# Patient Record
Sex: Male | Born: 1945
Health system: Southern US, Community
[De-identification: ages and names within clinical notes are randomized; demographics above are authoritative.]

## PROBLEM LIST (undated history)

## (undated) ENCOUNTER — Emergency Department (HOSPITAL_COMMUNITY): Payer: No Typology Code available for payment source

## (undated) DIAGNOSIS — N200 Calculus of kidney: Secondary | ICD-10-CM

## (undated) DIAGNOSIS — E785 Hyperlipidemia, unspecified: Secondary | ICD-10-CM

## (undated) DIAGNOSIS — I639 Cerebral infarction, unspecified: Secondary | ICD-10-CM

## (undated) DIAGNOSIS — Z87442 Personal history of urinary calculi: Secondary | ICD-10-CM

## (undated) DIAGNOSIS — C7802 Secondary malignant neoplasm of left lung: Secondary | ICD-10-CM

## (undated) DIAGNOSIS — C801 Malignant (primary) neoplasm, unspecified: Secondary | ICD-10-CM

## (undated) DIAGNOSIS — I10 Essential (primary) hypertension: Secondary | ICD-10-CM

## (undated) DIAGNOSIS — Z872 Personal history of diseases of the skin and subcutaneous tissue: Secondary | ICD-10-CM

## (undated) DIAGNOSIS — R011 Cardiac murmur, unspecified: Secondary | ICD-10-CM

## (undated) DIAGNOSIS — N281 Cyst of kidney, acquired: Secondary | ICD-10-CM

## (undated) DIAGNOSIS — M1990 Unspecified osteoarthritis, unspecified site: Secondary | ICD-10-CM

## (undated) DIAGNOSIS — E119 Type 2 diabetes mellitus without complications: Secondary | ICD-10-CM

## (undated) DIAGNOSIS — N529 Male erectile dysfunction, unspecified: Secondary | ICD-10-CM

## (undated) DIAGNOSIS — K7460 Unspecified cirrhosis of liver: Secondary | ICD-10-CM

## (undated) DIAGNOSIS — R319 Hematuria, unspecified: Secondary | ICD-10-CM

## (undated) DIAGNOSIS — Z973 Presence of spectacles and contact lenses: Secondary | ICD-10-CM

## (undated) HISTORY — PX: WISDOM TOOTH EXTRACTION: SHX21

## (undated) HISTORY — DX: Male erectile dysfunction, unspecified: N52.9

## (undated) HISTORY — DX: Unspecified cirrhosis of liver: K74.60

## (undated) HISTORY — DX: Essential (primary) hypertension: I10

## (undated) HISTORY — PX: ROTATOR CUFF REPAIR: SHX139

## (undated) HISTORY — DX: Hyperlipidemia, unspecified: E78.5

## (undated) HISTORY — PX: OTHER SURGICAL HISTORY: SHX169

## (undated) HISTORY — PX: CARPAL TUNNEL RELEASE: SHX101

---

## 2006-01-03 HISTORY — PX: KNEE ARTHROSCOPY: SUR90

## 2006-09-28 ENCOUNTER — Encounter (HOSPITAL_COMMUNITY): Admission: RE | Admit: 2006-09-28 | Discharge: 2006-10-03 | Payer: Self-pay

## 2006-10-04 ENCOUNTER — Encounter (HOSPITAL_COMMUNITY): Admission: RE | Admit: 2006-10-04 | Discharge: 2006-11-03 | Payer: Self-pay | Admitting: Orthopedic Surgery

## 2006-11-13 ENCOUNTER — Encounter (HOSPITAL_COMMUNITY): Admission: RE | Admit: 2006-11-13 | Discharge: 2006-12-13 | Payer: Self-pay | Admitting: Orthopedic Surgery

## 2006-12-15 ENCOUNTER — Encounter (HOSPITAL_COMMUNITY): Admission: RE | Admit: 2006-12-15 | Discharge: 2007-01-03 | Payer: Self-pay | Admitting: Orthopedic Surgery

## 2007-01-04 HISTORY — PX: TOTAL KNEE ARTHROPLASTY: SHX125

## 2007-07-02 ENCOUNTER — Encounter (HOSPITAL_COMMUNITY): Admission: RE | Admit: 2007-07-02 | Discharge: 2007-08-01 | Payer: Self-pay

## 2007-08-02 ENCOUNTER — Encounter (HOSPITAL_COMMUNITY): Admission: RE | Admit: 2007-08-02 | Discharge: 2007-09-01 | Payer: Self-pay | Admitting: Orthopedic Surgery

## 2007-09-03 ENCOUNTER — Encounter (HOSPITAL_COMMUNITY): Admission: RE | Admit: 2007-09-03 | Discharge: 2007-10-03 | Payer: Self-pay | Admitting: Orthopedic Surgery

## 2007-09-05 ENCOUNTER — Ambulatory Visit: Payer: Self-pay | Admitting: Cardiology

## 2007-09-14 ENCOUNTER — Ambulatory Visit: Payer: Self-pay

## 2007-10-04 ENCOUNTER — Encounter (HOSPITAL_COMMUNITY): Admission: RE | Admit: 2007-10-04 | Discharge: 2007-11-03 | Payer: Self-pay | Admitting: Orthopedic Surgery

## 2007-11-05 ENCOUNTER — Encounter (HOSPITAL_COMMUNITY): Admission: RE | Admit: 2007-11-05 | Discharge: 2007-12-05 | Payer: Self-pay | Admitting: Orthopedic Surgery

## 2010-02-03 ENCOUNTER — Encounter (INDEPENDENT_AMBULATORY_CARE_PROVIDER_SITE_OTHER): Payer: Self-pay | Admitting: *Deleted

## 2010-02-10 NOTE — Letter (Signed)
Summary: Pre Visit Letter Revised  [facility]  [address] [place], [place] [address]   Phone: [phone]  Fax: [phone]        02/03/2010 MRN: [id number] Dalton Miller [address] [place], [place]  [address]             Procedure Date:  03-04-10   Welcome to the Gastroenterology Division at [facility]    You are scheduled to see a nurse for your pre-procedure visit on 02-18-10 at 1:30P.M. on the 3rd floor at [facility], [address]  We ask that you try to arrive at our office 15 minutes prior to your appointment time to allow for check-in.  Please take a minute to review the attached form.  If you answer "Yes" to one or more of the questions on the first page, we ask that you call the person listed at your earliest opportunity.  If you answer "No" to all of the questions, please complete the rest of the form and bring it to your appointment.    Your nurse visit will consist of discussing your medical and surgical history, your immediate family medical history, and your medications.   If you are unable to list all of your medications on the form, please bring the medication bottles to your appointment and we will list them.  We will need to be aware of both prescribed and over the counter drugs.  We will need to know exact dosage information as well.    Please be prepared to read and sign documents such as consent forms, a financial agreement, and acknowledgement forms.  If necessary, and with your consent, a friend or relative is welcome to sit-in on the nurse visit with you.  Please bring your insurance card so that we may make a copy of it.  If your insurance requires a referral to see a specialist, please bring your referral form from your primary care physician.  No co-pay is required for this nurse visit.     If you cannot keep your appointment, please call [phone] to cancel or reschedule prior to your appointment date.  This allows  [place] the opportunity to schedule an appointment for another patient in need of care.    Thank you for choosing [facility] for your medical needs.  We appreciate the opportunity to care for you.  Please visit [place] at our website  to learn more about our practice.  Sincerely, The Gastroenterology Division

## 2010-02-16 ENCOUNTER — Encounter (INDEPENDENT_AMBULATORY_CARE_PROVIDER_SITE_OTHER): Payer: Self-pay | Admitting: *Deleted

## 2010-02-18 ENCOUNTER — Encounter: Payer: Self-pay | Admitting: Gastroenterology

## 2010-02-18 ENCOUNTER — Encounter (INDEPENDENT_AMBULATORY_CARE_PROVIDER_SITE_OTHER): Payer: Self-pay | Admitting: *Deleted

## 2010-02-24 NOTE — Letter (Signed)
Summary: [facility] Instructions  [facility]  [address] [place], [place] [address]   Phone: [phone]  Fax: [phone]       Dalton Miller    64/23/1947    MRN: [id number]        Procedure Day Dorna Bloom:  Lenor Coffin  03/04/10     Arrival Time:  7:30AM     Procedure Time:  8:00AM     Location of Procedure:                    _ X_  [facility] (4th Floor)                      PREPARATION FOR COLONOSCOPY WITH MOVIPREP   Starting 5 days prior to your procedure 64/[birth year]/12 do not eat nuts, seeds, popcorn, corn, beans, peas,  salads, or any raw vegetables.  Do not take any fiber supplements (e.g. Metamucil, Citrucel, and Benefiber).  THE DAY BEFORE YOUR PROCEDURE         DATE: 03/03/10  DAY: WEDNESDAY  1.  Drink clear liquids the entire day-NO SOLID FOOD  2.  Do not drink anything colored red or purple.  Avoid juices with pulp.  No orange juice.  3.  Drink at least 64 oz. (8 glasses) of fluid/clear liquids during the day to prevent dehydration and help the prep work efficiently.  CLEAR LIQUIDS INCLUDE: Water Jello Ice Popsicles Tea (sugar ok, no milk/cream) Powdered fruit flavored drinks Coffee (sugar ok, no milk/cream) Gatorade Juice: apple, white grape, white cranberry  Lemonade Clear bullion, consomm, broth Carbonated beverages (any kind) Strained chicken noodle soup Hard Candy                             4.  In the morning, mix first dose of MoviPrep solution:    Empty 1 Pouch A and 1 Pouch B into the disposable container    Add lukewarm drinking water to the top line of the container. Mix to dissolve    Refrigerate (mixed solution should be used within 24 hrs)  5.  Begin drinking the prep at 5:00 p.m. The MoviPrep container is divided by 4 marks.   Every 15 minutes drink the solution down to the next mark (approximately 8 oz) until the full liter is complete.   6.  Follow completed prep with 16 oz of clear liquid of your choice (Nothing  red or purple).  Continue to drink clear liquids until bedtime.  7.  Before going to bed, mix second dose of MoviPrep solution:    Empty 1 Pouch A and 1 Pouch B into the disposable container    Add lukewarm drinking water to the top line of the container. Mix to dissolve    Refrigerate  THE DAY OF YOUR PROCEDURE      DATE: 03/04/10   DAY: THURSDAY  Beginning at 3:00AM (5 hours before procedure):         1. Every 15 minutes, drink the solution down to the next mark (approx 8 oz) until the full liter is complete.  2. Follow completed prep with 16 oz. of clear liquid of your choice.    3. You may drink clear liquids until 6:00AM (2 HOURS BEFORE PROCEDURE).   MEDICATION INSTRUCTIONS  Unless otherwise instructed, you should take regular prescription medications with a small sip of water   as early as possible the morning of  your procedure.  Diabetic patients - see separate instructions.         OTHER INSTRUCTIONS  You will need a responsible adult at least 64 years of age to accompany you and drive you home.   This person must remain in the waiting room during your procedure.  Wear loose fitting clothing that is easily removed.  Leave jewelry and other valuables at home.  However, you may wish to bring a book to read or  an iPod/MP3 player to listen to music as you wait for your procedure to start.  Remove all body piercing jewelry and leave at home.  Total time from sign-in until discharge is approximately 2-3 hours.  You should go home directly after your procedure and rest.  You can resume normal activities the  day after your procedure.  The day of your procedure you should not:   Drive   Make legal decisions   Operate machinery   Drink alcohol   Return to work  You will receive specific instructions about eating, activities and medications before you leave.    The above instructions have been reviewed and explained to me by   Karl Bales RN   February 18, 2010 1:54 PM    I fully understand and can verbalize these instructions _____________________________ Date _________

## 2010-02-24 NOTE — Letter (Signed)
Summary: Diabetic Instructions   [facility]  [address]   [place], [place] [address]   Phone: [phone]  Fax: [phone]    Dalton Miller 05/09/1945 MRN: [id number]   x    ORAL DIABETIC MEDICATION INSTRUCTIONS  The Miller before your procedure:   Take your diabetic pill as you do normally  The Miller of your procedure:   Do not take your diabetic pill    We will check your blood sugar levels during the admission process and again in Recovery before discharging you home  ________________________________________________________________________  _  _   INSULIN (LONG ACTING) MEDICATION INSTRUCTIONS (Lantus, NPH, 70/30, Humulin, Novolin-N)   The Miller before your procedure:   Take  your regular evening dose    The Miller of your procedure:   Do not take your morning dose    _  _   INSULIN (SHORT ACTING) MEDICATION INSTRUCTIONS (Regular, Humulog, Novolog)   The Miller before your procedure:   Do not take your evening dose   The Miller of your procedure:   Do not take your morning dose   _  _   INSULIN PUMP MEDICATION INSTRUCTIONS  We will contact the physician managing your diabetic care for written dosage instructions for the Miller before your procedure and the Miller of your procedure.  Once we have received the instructions, we will contact you.

## 2010-02-24 NOTE — Miscellaneous (Signed)
Summary: [facility] previsit  Clinical Lists Changes  Medications: Added new medication of MOVIPREP 100 GM  SOLR (PEG-KCL-NACL-NASULF-NA ASC-C) As per prep instructions. - Signed Rx of MOVIPREP 100 GM  SOLR (PEG-KCL-NACL-NASULF-NA ASC-C) As per prep instructions.;  #1 x 0;  Signed;  Entered by: Karl Bales RN;  Authorized by: Louis Meckel MD;  Method used: Electronically to [facility]*, [address] [place], [place], [place]  [address], Ph: [phone], Fax: [phone] Observations: Added new observation of NKA: T (02/18/2010 13:43)    Prescriptions: MOVIPREP 100 GM  SOLR (PEG-KCL-NACL-NASULF-NA ASC-C) As per prep instructions.  #1 x 0   Entered by:   Karl Bales RN   Authorized by:   Louis Meckel MD   Signed by:   Karl Bales RN on 02/18/2010   Method used:   Electronically to        [facility]* (retail)       [address]       [place], [place]  [address]       Ph: [phone]       Fax: [phone]   RxID:   [id number]

## 2010-03-04 ENCOUNTER — Other Ambulatory Visit (AMBULATORY_SURGERY_CENTER): Payer: Medicare Other | Admitting: Gastroenterology

## 2010-03-04 ENCOUNTER — Other Ambulatory Visit: Payer: Self-pay | Admitting: Gastroenterology

## 2010-03-04 DIAGNOSIS — K5520 Angiodysplasia of colon without hemorrhage: Secondary | ICD-10-CM

## 2010-03-04 DIAGNOSIS — Z1211 Encounter for screening for malignant neoplasm of colon: Secondary | ICD-10-CM

## 2010-03-04 HISTORY — PX: COLONOSCOPY: SHX174

## 2010-03-04 LAB — HM COLONOSCOPY

## 2010-03-04 LAB — GLUCOSE, CAPILLARY
Glucose-Capillary: 155 mg/dL — ABNORMAL HIGH (ref 70–99)
Glucose-Capillary: 119 mg/dL — ABNORMAL HIGH (ref 70–99)

## 2010-03-11 NOTE — Procedures (Signed)
Summary: Colonoscopy  Patient: [name] Note: All result statuses are Final unless otherwise noted.  Tests: (1) Colonoscopy (COL)   COL Colonoscopy           DONE      [facility]     [address]     [place], [place]  [address]           COLONOSCOPY PROCEDURE REPORT           PATIENT:  Dalton Miller, Dalton Miller  MR#:  [id number]     BIRTHDATE:  Jan 21, 1945, 65 yrs. old  GENDER:  male           ENDOSCOPIST:  Barbette Hair. Arlyce Dice, MD     Referred by:  Helene Kelp, PA           PROCEDURE DATE:  03/04/2010     PROCEDURE:  Diagnostic Colonoscopy     ASA CLASS:  Class II     INDICATIONS:  1) Routine Risk Screening           MEDICATIONS:   Fentanyl 75 mcg IV, Versed 6 mg IV           DESCRIPTION OF PROCEDURE:   After the risks benefits and     alternatives of the procedure were thoroughly explained, informed     consent was obtained.  Digital rectal exam was performed and     revealed no abnormalities.   The LB CF-[id number] endoscope     was introduced through the anus and advanced to the cecum, which     was identified by both the appendix and ileocecal valve, without     limitations.  The quality of the prep was excellent, using     MoviPrep.  The instrument was then slowly withdrawn as the colon     was fully examined.     <<PROCEDUREIMAGES>>           FINDINGS:  Arteriovenous malformations were seen in the in the     cecum (see image2). Single 2mm AVM  This was otherwise a normal     examination of the colon (see image1, image3, image4, image6,     image7, image9, image11, and image12).   Retroflexed views in the     rectum revealed no abnormalities.    The time to cecum =  2.25     minutes. The scope was then withdrawn (time =  7.0  min) from the     patient and the procedure completed.           COMPLICATIONS:  None           ENDOSCOPIC IMPRESSION:     1) AVM in the cecum     2) Otherwise normal examination     RECOMMENDATIONS:     1) Continue current colorectal  screening recommendations for     "routine risk" patients with a repeat colonoscopy in 10 years.           REPEAT EXAM:   10 year(s) Colonoscopy           ______________________________     Barbette Hair. Arlyce Dice, MD           CC:           n.     eSIGNED:   Barbette Hair. Kaplan at 03/04/2010 08:37 AM           Butt, Homero Fellers, [id number]  Note: An exclamation mark (!) indicates a result that was not  dispersed into the flowsheet. Document Creation Date: 03/04/2010 8:37 AM _______________________________________________________________________  (1) Order result status: Final Collection or observation date-time: 03/04/2010 08:30 Requested date-time:  Receipt date-time:  Reported date-time:  Referring Physician:   Ordering Physician: Melvia Heaps [phone]) Specimen Source:  Source: Launa Grill Order Number: [id number] Lab site:   Appended Document: Colonoscopy    Clinical Lists Changes  Observations: Added new observation of COLONNXTDUE: 03/2020 (03/04/2010 13:54)

## 2010-03-22 ENCOUNTER — Encounter: Payer: Self-pay | Admitting: *Deleted

## 2010-03-22 DIAGNOSIS — E1169 Type 2 diabetes mellitus with other specified complication: Secondary | ICD-10-CM | POA: Insufficient documentation

## 2010-03-22 DIAGNOSIS — E119 Type 2 diabetes mellitus without complications: Secondary | ICD-10-CM | POA: Insufficient documentation

## 2010-03-22 DIAGNOSIS — E785 Hyperlipidemia, unspecified: Secondary | ICD-10-CM

## 2010-03-22 DIAGNOSIS — I10 Essential (primary) hypertension: Secondary | ICD-10-CM | POA: Insufficient documentation

## 2010-05-18 NOTE — Assessment & Plan Note (Signed)
[facility]                            CARDIOLOGY OFFICE NOTE   Aadit, Hagood BRANDI ARMATO                         MRN:          [id number]  DATE:09/05/2007                            DOB:          10-Feb-1945    PRIMARY CARE PHYSICIAN:  Lindaann Pascal, PA-C, [facility].   REASON FOR PRESENTATION:  Evaluate the patient with dyspnea and multiple  cardiovascular risk factors.   HISTORY OF PRESENT ILLNESS:  The patient is a pleasant 65 year old  gentleman without a prior cardiac history.  His only cardiac work up was  a stress test in [place].  He has multiple cardiovascular risk  factors.  He recently established with Mr. Jacqulyn Bath as a primary care.  He  has diabetes, which is being managed.  He is recently restarted on  Lipitor for dyslipidemia.  He was encouraged to quit smoking and he was  referred here because of multiple cardiovascular risk factors.   The patient is status post recent right knee replacement.  He is limited  in his activities because of this.  He gets around with a cane.  He is  not able to do his usual yard work.  He does get some dyspnea when he  walks moderate distance on level ground.  He does not have any resting  shortness of breath, PND, or orthopnea.  He does not have any chest  pressure, neck or arm discomfort.  He has no palpitations, presyncope or  syncope.   Of note, the patient does have an abnormal EKG with poor anterior R-wave  progression and could not exclude an old anteroseptal infarct.   PAST MEDICAL HISTORY:  1. Diabetes mellitus since 2003.  2. Hyperlipidemia since 2005.  3. Tobacco abuse.   PAST SURGICAL HISTORY:  Right knee arthroscopic surgery and eventual  right knee replacement.   ALLERGIES:  None.   MEDICATIONS:  1. Lipitor 10 mg daily (recently restarted).  2. Glipizide 10 mg daily.  3. Percocet.  4. Vicodin.   SOCIAL HISTORY:  The patient is a Location manager.  He has  not been  able to work with his knee replacement recently.  He is married.  He has  2 children.  He smokes 2 packs per day since the age of 86.   FAMILY HISTORY:  Remarkable for early coronary disease.  He had a  brother in his 31s who died of a myocardial infarction.  His mother died  of a CVA age 45, and his father had heart failure at age 46.   REVIEW OF SYSTEMS:  As stated in the HPI and negative for all other  systems.   PHYSICAL EXAMINATION:  GENERAL:  The patient is in no distress.  VITAL SIGNS:  Blood pressure 122/76, heart rate 67 and regular, weight  189 pounds, and body mass index 29.  HEENT:  Eyes unremarkable, pupils equal, round, and reactive to light;  fundi not visualized, oral mucosa unremarkable.  NECK:  No jugular venous distension at 45 degrees, carotid upstroke  brisk and symmetrical, no bruit, no thyromegaly.  LYMPHATICS:  No cervical, axillary, or inguinal adenopathy.  LUNGS:  Clear to auscultation bilaterally.  BACK:  No costovertebral angle tenderness.  CHEST:  Unremarkable.  HEART:  PMI not displaced or sustained; S1 and S2 are within normal  limits; no S3, no S4, no clicks, no rubs, no murmurs.  ABDOMEN:  Obese, positive bowel sounds, normal in frequency and pitch,  no bruits, no rebound, no guarding, no midline pulsatile mass, no  hepatomegaly, no splenomegaly.  SKIN:  No rashes, no nodules.  EXTREMITIES:  2+ pulses throughout, no edema, no cyanosis or clubbing.  NEURO:  Oriented to person, place, and time, cranial nerves II through  XII grossly intact, motor grossly intact.   EKG sinus rhythm, rate 67, low voltage in the limb leads, axis within  normal limits, intervals within normal limits, poor anterior R-wave  progression, cannot exclude old anteroseptal infarct.   ASSESSMENT AND PLAN:  1. Dyspnea.  The patient does have some dyspnea.  He is very limited      functionally because of his knee.  He has multiple cardiovascular      risk factors and  an abnormal EKG.  Given this, the pretest      probability of obstructive coronary disease is at least moderately      high.  The patient needs to be screened with a stress test.  He      would not be able to walk on a treadmill.  Therefore, he will have      an adenosine Cardiolite.  2. Tobacco.  We discussed the need to stop smoking (greater than 3      minutes).  I think he is now comitted to quitting cold [place],      which he has done before.  3. Diabetes per Mr. Jacqulyn Bath.  4. Hyperlipidemia.  His LDL was 138 and so he had his Lipitor      restarted.  I would suggest an LDL goal of less than 100 and HDL      greater than 40.  5. Weight.  We will work on losing weight over time.  Hopefully, as      his knee gets better he will be able to exercise more and this will      help.  6. Followup.  I will see the patient again in about 6 months or sooner      if he has any      abnormalities on a stress test.  I would like to continue to see      him to encourage primary risk reduction.     Rollene Rotunda, MD, [facility]  Electronically Signed    JH/MedQ  DD: 09/05/2007  DT: 09/06/2007  Job #: [id number]   cc:   Lindaann Pascal, PA-C

## 2010-06-09 ENCOUNTER — Encounter: Payer: Self-pay | Admitting: Physician Assistant

## 2012-09-05 ENCOUNTER — Emergency Department (HOSPITAL_COMMUNITY)
Admission: EM | Admit: 2012-09-05 | Discharge: 2012-09-05 | Disposition: A | Discharge status: Home / Self Care | Payer: Medicare Other | Attending: Emergency Medicine | Admitting: Emergency Medicine

## 2012-09-05 ENCOUNTER — Encounter (HOSPITAL_COMMUNITY): Payer: Self-pay | Admitting: *Deleted

## 2012-09-05 DIAGNOSIS — E785 Hyperlipidemia, unspecified: Secondary | ICD-10-CM | POA: Insufficient documentation

## 2012-09-05 DIAGNOSIS — Y929 Unspecified place or not applicable: Secondary | ICD-10-CM | POA: Insufficient documentation

## 2012-09-05 DIAGNOSIS — E119 Type 2 diabetes mellitus without complications: Secondary | ICD-10-CM | POA: Insufficient documentation

## 2012-09-05 DIAGNOSIS — Z79899 Other long term (current) drug therapy: Secondary | ICD-10-CM | POA: Insufficient documentation

## 2012-09-05 DIAGNOSIS — T169XXA Foreign body in ear, unspecified ear, initial encounter: Secondary | ICD-10-CM | POA: Insufficient documentation

## 2012-09-05 DIAGNOSIS — Y93K1 Activity, walking an animal: Secondary | ICD-10-CM | POA: Insufficient documentation

## 2012-09-05 DIAGNOSIS — Z87448 Personal history of other diseases of urinary system: Secondary | ICD-10-CM | POA: Insufficient documentation

## 2012-09-05 DIAGNOSIS — IMO0002 Reserved for concepts with insufficient information to code with codable children: Secondary | ICD-10-CM | POA: Insufficient documentation

## 2012-09-05 DIAGNOSIS — I10 Essential (primary) hypertension: Secondary | ICD-10-CM | POA: Insufficient documentation

## 2012-09-05 DIAGNOSIS — F17200 Nicotine dependence, unspecified, uncomplicated: Secondary | ICD-10-CM | POA: Insufficient documentation

## 2012-09-05 DIAGNOSIS — Z872 Personal history of diseases of the skin and subcutaneous tissue: Secondary | ICD-10-CM | POA: Insufficient documentation

## 2012-09-05 MED ORDER — CARBAMIDE PEROXIDE 6.5 % OT SOLN
5.0000 [drp] | Freq: Once | OTIC | Status: AC
  Administered 2012-09-05: 5 [drp] via OTIC
  Filled 2012-09-05: qty 15

## 2012-09-05 NOTE — ED Notes (Signed)
Patient given discharge instruction, verbalized understand. Patient ambulatory out of the department.  

## 2012-09-05 NOTE — ED Notes (Signed)
PA at the bedside, irrigation unsuccessful, pt tolerated well.

## 2012-09-05 NOTE — ED Notes (Signed)
Pt reports a bug flew into his ear at approximately  2120 this evening while walking his dog. Pt reports can hear bug Buzzing in his ear. Denies pain.

## 2012-09-05 NOTE — ED Provider Notes (Signed)
CSN: [id number]     Arrival date & time 09/05/12  2147 History   First MD Initiated Contact with Patient 09/05/12 2201     Chief Complaint  Patient presents with  . Foreign Body in Ear   (Consider location/radiation/quality/duration/timing/severity/associated sxs/prior Treatment) HPI Comments: Dalton Miller is a 67 y.o. Male who was walking his dog this evening when an insect flew in his right ear.  He denies pain,  But continues to hear a buzzing sound, so knows it is still present.  He denies any other symptoms tonight.     The history is provided by the patient.    Past Medical History  Diagnosis Date  . Cellulitis 03/16/10    resolved in left wrist   . RUQ pain     resolved   . Diabetes mellitus   . Hypertension   . Hyperlipidemia   . Abscess   . Erectile dysfunction    History reviewed. No pertinent past surgical history. No family history on file. History  Substance Use Topics  . Smoking status: Current Every Day Smoker  . Smokeless tobacco: Not on file  . Alcohol Use: No    Review of Systems  Constitutional: Negative for fever.  HENT: Negative for ear pain, congestion, sore throat and neck pain.   Eyes: Negative.   Respiratory: Negative for shortness of breath.   Cardiovascular: Negative for chest pain.  Gastrointestinal: Negative for nausea.  Musculoskeletal: Negative.   Skin: Negative.   Neurological: Negative for headaches.  Psychiatric/Behavioral: Negative.     Allergies  Review of patient's allergies indicates no known allergies.  Home Medications   Current Outpatient Rx  Name  Route  Sig  Dispense  Refill  . atorvastatin (LIPITOR) 10 MG tablet   Oral   Take 10 mg by mouth daily.           [name] glipiZIDE (GLUCOTROL) 5 MG tablet   Oral   Take 5 mg by mouth daily.           [name] lisinopril (PRINIVIL,ZESTRIL) 10 MG tablet   Oral   Take 10 mg by mouth daily.            BP 124/58  Pulse 66  Temp(Src) 97.7 F (36.5 C) (Oral)  Resp 20  Ht  5\' 7"  (1.702 m)  Wt 189 lb (85.73 kg)  BMI 29.59 kg/m2  SpO2 98% Physical Exam  Constitutional: He is oriented to person, place, and time. He appears well-developed and well-nourished.  HENT:  Head: Normocephalic and atraumatic.  Right Ear: Tympanic membrane normal. A foreign body is present.  Left Ear: Tympanic membrane and ear canal normal.  Nose: Mucosal edema and rhinorrhea present.  Mouth/Throat: Uvula is midline, oropharynx is clear and moist and mucous membranes are normal. No oropharyngeal exudate, posterior oropharyngeal edema, posterior oropharyngeal erythema or tonsillar abscesses.  Small flying insect (moth?) deep in right ear canal.  Occasional movement  Eyes: Conjunctivae are normal.  Cardiovascular: Normal rate and normal heart sounds.   Pulmonary/Chest: Effort normal. No respiratory distress. He has no wheezes. He has no rales.  Abdominal: Soft. There is no tenderness.  Musculoskeletal: Normal range of motion.  Neurological: He is alert and oriented to person, place, and time.  Skin: Skin is warm and dry. No rash noted.  Psychiatric: He has a normal mood and affect.    ED Course  FOREIGN BODY REMOVAL Date/Time: 09/06/2012 1:50 AM Performed by: Burgess Amor Authorized by: Burgess Amor Risks and benefits:  risks, benefits and alternatives were discussed Consent given by: patient Patient understanding: patient states understanding of the procedure being performed Patient identity confirmed: verbally with patient Body area: ear Location details: right ear Localization method: ENT speculum Removal mechanism: curette, forceps and suction Complexity: simple 1 objects recovered. Objects recovered: moth Post-procedure assessment: foreign body removed Patient tolerance: Patient tolerated the procedure well with no immediate complications.   (including critical care time)   Labs Review Labs Reviewed - No data to display Imaging Review No results found.  MDM   1.  Ear foreign body, initial encounter    Prn f/u with pcp for any residual problems or concerns.    Burgess Amor, PA-C 09/06/12 [phone]

## 2012-09-05 NOTE — ED Notes (Signed)
PA removed large bug, pt wanted to take with him to show family

## 2012-09-06 ENCOUNTER — Other Ambulatory Visit: Payer: Self-pay

## 2012-09-06 NOTE — Telephone Encounter (Signed)
Last seen 02/06/12  ACM   Last lipids and glucose level 2/14

## 2012-09-07 MED ORDER — GLIPIZIDE 5 MG PO TABS: 5.0000 mg | ORAL_TABLET | Freq: Every day | ORAL | Status: DC

## 2012-09-07 MED ORDER — LISINOPRIL 10 MG PO TABS: 10.0000 mg | ORAL_TABLET | Freq: Every day | ORAL | Status: DC

## 2012-09-07 MED ORDER — ATORVASTATIN CALCIUM 10 MG PO TABS: 10.0000 mg | ORAL_TABLET | Freq: Every day | ORAL | Status: DC

## 2012-09-08 NOTE — ED Provider Notes (Signed)
Medical screening examination/treatment/procedure(s) were performed by non-physician practitioner and as supervising physician I was immediately available for consultation/collaboration.  Donnetta Hutching, MD 09/08/12 [phone]

## 2012-09-17 ENCOUNTER — Other Ambulatory Visit: Payer: Self-pay

## 2012-09-17 MED ORDER — LISINOPRIL 10 MG PO TABS: 10.0000 mg | ORAL_TABLET | Freq: Every day | ORAL | Status: DC

## 2012-09-17 MED ORDER — ATORVASTATIN CALCIUM 10 MG PO TABS: 10.0000 mg | ORAL_TABLET | Freq: Every day | ORAL | Status: DC

## 2012-09-17 NOTE — Telephone Encounter (Signed)
x

## 2012-10-04 ENCOUNTER — Ambulatory Visit (INDEPENDENT_AMBULATORY_CARE_PROVIDER_SITE_OTHER): Payer: Medicare Other | Admitting: Family Medicine

## 2012-10-04 ENCOUNTER — Encounter: Payer: Self-pay | Admitting: Family Medicine

## 2012-10-04 ENCOUNTER — Other Ambulatory Visit: Payer: Self-pay

## 2012-10-04 VITALS — BP 122/65 | HR 68 | Temp 98.3°F | Ht 67.0 in | Wt 189.4 lb

## 2012-10-04 DIAGNOSIS — E119 Type 2 diabetes mellitus without complications: Secondary | ICD-10-CM

## 2012-10-04 DIAGNOSIS — E785 Hyperlipidemia, unspecified: Secondary | ICD-10-CM

## 2012-10-04 DIAGNOSIS — F17200 Nicotine dependence, unspecified, uncomplicated: Secondary | ICD-10-CM

## 2012-10-04 DIAGNOSIS — N529 Male erectile dysfunction, unspecified: Secondary | ICD-10-CM | POA: Insufficient documentation

## 2012-10-04 DIAGNOSIS — I10 Essential (primary) hypertension: Secondary | ICD-10-CM

## 2012-10-04 DIAGNOSIS — Z720 Tobacco use: Secondary | ICD-10-CM | POA: Insufficient documentation

## 2012-10-04 LAB — POCT GLYCOSYLATED HEMOGLOBIN (HGB A1C): Hemoglobin A1C: 6.2

## 2012-10-04 MED ORDER — GLIPIZIDE 5 MG PO TABS: 5.0000 mg | ORAL_TABLET | Freq: Every day | ORAL | Status: DC

## 2012-10-04 MED ORDER — ATORVASTATIN CALCIUM 10 MG PO TABS: 10.0000 mg | ORAL_TABLET | Freq: Every day | ORAL | Status: DC

## 2012-10-04 MED ORDER — LISINOPRIL 10 MG PO TABS: 10.0000 mg | ORAL_TABLET | Freq: Every day | ORAL | Status: DC

## 2012-10-04 MED ORDER — METFORMIN HCL 500 MG PO TABS: 500.0000 mg | ORAL_TABLET | Freq: Two times a day (BID) | ORAL | Status: DC

## 2012-10-04 NOTE — Patient Instructions (Addendum)
Smoking Cessation Quitting smoking is important to your health and has many advantages. However, it is not always easy to quit since nicotine is a very addictive drug. Often times, people try 3 times or more before being able to quit. This document explains the best ways for you to prepare to quit smoking. Quitting takes hard work and a lot of effort, but you can do it. ADVANTAGES OF QUITTING SMOKING  You will live longer, feel better, and live better.  Your body will feel the impact of quitting smoking almost immediately.  Within 20 minutes, blood pressure decreases. Your pulse returns to its normal level.  After 8 hours, carbon monoxide levels in the blood return to normal. Your oxygen level increases.  After 24 hours, the chance of having a heart attack starts to decrease. Your breath, hair, and body stop smelling like smoke.  After 48 hours, damaged nerve endings begin to recover. Your sense of taste and smell improve.  After 72 hours, the body is virtually free of nicotine. Your bronchial tubes relax and breathing becomes easier.  After 2 to 12 weeks, lungs can hold more air. Exercise becomes easier and circulation improves.  The risk of having a heart attack, stroke, cancer, or lung disease is greatly reduced.  After 1 year, the risk of coronary heart disease is cut in half.  After 5 years, the risk of stroke falls to the same as a nonsmoker.  After 10 years, the risk of lung cancer is cut in half and the risk of other cancers decreases significantly.  After 15 years, the risk of coronary heart disease drops, usually to the level of a nonsmoker.  If you are pregnant, quitting smoking will improve your chances of having a healthy baby.  The people you live with, especially any children, will be healthier.  You will have extra money to spend on things other than cigarettes. QUESTIONS TO THINK ABOUT BEFORE ATTEMPTING TO QUIT You may want to talk about your answers with your  caregiver.  Why do you want to quit?  If you tried to quit in the past, what helped and what did not?  What will be the most difficult situations for you after you quit? How will you plan to handle them?  Who can help you through the tough times? Your family? Friends? A caregiver?  What pleasures do you get from smoking? What ways can you still get pleasure if you quit? Here are some questions to ask your caregiver:  How can you help me to be successful at quitting?  What medicine do you think would be best for me and how should I take it?  What should I do if I need more help?  What is smoking withdrawal like? How can I get information on withdrawal? GET READY  Set a quit date.  Change your environment by getting rid of all cigarettes, ashtrays, matches, and lighters in your home, car, or work. Do not let people smoke in your home.  Review your past attempts to quit. Think about what worked and what did not. GET SUPPORT AND ENCOURAGEMENT You have a better chance of being successful if you have help. You can get support in many ways.  Tell your family, friends, and co-workers that you are going to quit and need their support. Ask them not to smoke around you.  Get individual, group, or telephone counseling and support. Programs are available at local hospitals and health centers. Call your local health department for   information about programs in your area.  Spiritual beliefs and practices may help some smokers quit.  Download a "quit meter" on your computer to keep track of quit statistics, such as how long you have gone without smoking, cigarettes not smoked, and money saved.  Get a self-help book about quitting smoking and staying off of tobacco. LEARN NEW SKILLS AND BEHAVIORS  Distract yourself from urges to smoke. Talk to someone, go for a walk, or occupy your time with a task.  Change your normal routine. Take a different route to work. Drink tea instead of coffee.  Eat breakfast in a different place.  Reduce your stress. Take a hot bath, exercise, or read a book.  Plan something enjoyable to do every day. Reward yourself for not smoking.  Explore interactive web-based programs that specialize in helping you quit. GET MEDICINE AND USE IT CORRECTLY Medicines can help you stop smoking and decrease the urge to smoke. Combining medicine with the above behavioral methods and support can greatly increase your chances of successfully quitting smoking.  Nicotine replacement therapy helps deliver nicotine to your body without the negative effects and risks of smoking. Nicotine replacement therapy includes nicotine gum, lozenges, inhalers, nasal sprays, and skin patches. Some may be available over-the-counter and others require a prescription.  Antidepressant medicine helps people abstain from smoking, but how this works is unknown. This medicine is available by prescription.  Nicotinic receptor partial agonist medicine simulates the effect of nicotine in your brain. This medicine is available by prescription. Ask your caregiver for advice about which medicines to use and how to use them based on your health history. Your caregiver will tell you what side effects to look out for if you choose to be on a medicine or therapy. Carefully read the information on the package. Do not use any other product containing nicotine while using a nicotine replacement product.  RELAPSE OR DIFFICULT SITUATIONS Most relapses occur within the first 3 months after quitting. Do not be discouraged if you start smoking again. Remember, most people try several times before finally quitting. You may have symptoms of withdrawal because your body is used to nicotine. You may crave cigarettes, be irritable, feel very hungry, cough often, get headaches, or have difficulty concentrating. The withdrawal symptoms are only temporary. They are strongest when you first quit, but they will go away within  10 14 days. To reduce the chances of relapse, try to:  Avoid drinking alcohol. Drinking lowers your chances of successfully quitting.  Reduce the amount of caffeine you consume. Once you quit smoking, the amount of caffeine in your body increases and can give you symptoms, such as a rapid heartbeat, sweating, and anxiety.  Avoid smokers because they can make you want to smoke.  Do not let weight gain distract you. Many smokers will gain weight when they quit, usually less than 10 pounds. Eat a healthy diet and stay active. You can always lose the weight gained after you quit.  Find ways to improve your mood other than smoking. FOR MORE INFORMATION  www.smokefree.gov  Document Released: 12/14/2000 Document Revised: 06/21/2011 Document Reviewed: 03/31/2011 [employer] Patient Information 2014 [employer].        Dr Edson Deridder's Recommendations  For nutrition information, I recommend books:  1).Eat to Live by Dr Joel Fuhrman. 2).Prevent and Reverse Heart Disease by Dr Caldwell Esselstyn. 3) Dr Neal Barnard's Book:  Program to Reverse Diabetes  Exercise recommendations are:  If unable to walk, then the patient can   exercise in a chair 3 times a day. By flapping arms like a bird gently and raising legs outwards to the front.  If ambulatory, the patient can go for walks for 30 minutes 3 times a week. Then increase the intensity and duration as tolerated.  Goal is to try to attain exercise frequency to 5 times a week.  If applicable: Best to perform resistance exercises (machines or weights) 2 days a week and cardio type exercises 3 days per week.  

## 2012-10-04 NOTE — Progress Notes (Signed)
Patient ID: Dalton Miller, male   DOB: 03/02/1945, 67 y.o.   MRN: [id number] SUBJECTIVE: CC: Chief Complaint  Patient presents with  . Medication Refill    needs refills on all meds wants 90 days supply  Wants RX Printed    HPI:   Patient is here for follow up of Diabetes Mellitus/HTN/HLD: Symptoms evaluated: Denies Nocturia ,Denies Urinary Frequency , denies Blurred vision ,deniesDizziness,denies.Dysuria,denies paresthesias, denies extremity pain or ulcers.Marland Kitchendenies chest pain. has had an annual eye exam. do check the feet. Does check CBGs. Average IRJ:JOACZ'Y check all the time Denies episodes of hypoglycemia. Does have an emergency hypoglycemic plan. admits toCompliance with medications. Denies Problems with medications.  Smokes Diet could be better  Past Medical History  Diagnosis Date  . Cellulitis 03/16/10    resolved in left wrist   . RUQ pain     resolved   . Diabetes mellitus   . Hypertension   . Hyperlipidemia   . Abscess   . Erectile dysfunction    No past surgical history on file. History   Social History  . Marital Status: Married    Spouse Name: N/A    Number of Children: N/A  . Years of Education: N/A   Occupational History  . Not on file.   Social History Main Topics  . Smoking status: Current Every Day Smoker  . Smokeless tobacco: Not on file  . Alcohol Use: No  . Drug Use: No  . Sexual Activity: Not on file   Other Topics Concern  . Not on file   Social History Narrative  . No narrative on file   No family history on file. Current Outpatient Prescriptions on File Prior to Visit  Medication Sig Dispense Refill  . atorvastatin (LIPITOR) 10 MG tablet Take 1 tablet (10 mg total) by mouth daily.  30 tablet  0  . glipiZIDE (GLUCOTROL) 5 MG tablet Take 1 tablet (5 mg total) by mouth daily.  30 tablet  0  . lisinopril (PRINIVIL,ZESTRIL) 10 MG tablet Take 1 tablet (10 mg total) by mouth daily.  30 tablet  0   No current facility-administered  medications on file prior to visit.   No Known Allergies Immunization History  Administered Date(s) Administered  . Influenza Whole 10/04/2007  . Pneumococcal Polysaccharide 10/04/2007   Prior to Admission medications   Medication Sig Start Date End Date Taking? Authorizing Provider  atorvastatin (LIPITOR) 10 MG tablet Take 1 tablet (10 mg total) by mouth daily. 09/17/12  Yes Ernestina Penna, MD  glipiZIDE (GLUCOTROL) 5 MG tablet Take 1 tablet (5 mg total) by mouth daily. 09/06/12  Yes Ileana Ladd, MD  lisinopril (PRINIVIL,ZESTRIL) 10 MG tablet Take 1 tablet (10 mg total) by mouth daily. 09/17/12  Yes Ernestina Penna, MD  metFORMIN (GLUCOPHAGE) 500 MG tablet Take 500 mg by mouth 2 (two) times daily with a meal.   Yes Historical Provider, MD    ROS: As above in the HPI. All other systems are stable or negative.  OBJECTIVE: APPEARANCE:  Patient in no acute distress.The patient appeared well nourished and normally developed. Acyanotic. Waist: VITAL SIGNS:BP 122/65  Pulse 68  Temp(Src) 98.3 F (36.8 C) (Oral)  Ht 5\' 7"  (1.702 m)  Wt 189 lb 6.4 oz (85.911 kg)  BMI 29.66 kg/m2 WM overweight  SKIN: warm and  Dry without overt rashes, tattoos and scars  HEAD and Neck: without JVD, Head and scalp: normal Eyes:No scleral icterus. Fundi normal, eye movements normal. Ears: Auricle  normal, canal normal, Tympanic membranes normal, insufflation normal. Nose: normal Throat: normal Neck & thyroid: normal  CHEST & LUNGS: Chest wall: normal Lungs: Clear  CVS: Reveals the PMI to be normally located. Regular rhythm, First and Second Heart sounds are normal,  absence of murmurs, rubs or gallops. Peripheral vasculature: Radial pulses: normal Dorsal pedis pulses: normal Posterior pulses: normal  ABDOMEN:  Appearance: overweight Benign, no organomegaly, no masses, no Abdominal Aortic enlargement. No Guarding , no rebound. No Bruits. Bowel sounds: normal  RECTAL: N/A GU:  N/A  EXTREMETIES: nonedematous.  MUSCULOSKELETAL:  Spine: normal Joints: intact  NEUROLOGIC: oriented to time,place and person; nonfocal. Strength is normal Sensory is normal Reflexes are normal Cranial Nerves are normal. DM foot exam performed.  Results for orders placed in visit on 10/04/12  POCT GLYCOSYLATED HEMOGLOBIN (HGB A1C)      Result Value Range   Hemoglobin A1C 6.2 %      ASSESSMENT: Diabetes mellitus - Plan: glipiZIDE (GLUCOTROL) 5 MG tablet, POCT glycosylated hemoglobin (Hb A1C), POCT UA - Microalbumin, CMP14+EGFR, DISCONTINUED: metFORMIN (GLUCOPHAGE) 500 MG tablet  Hyperlipidemia - Plan: atorvastatin (LIPITOR) 10 MG tablet, NMR, lipoprofile, CMP14+EGFR  Hypertension - Plan: lisinopril (PRINIVIL,ZESTRIL) 10 MG tablet, CMP14+EGFR  Tobacco user  Erectile dysfunction   PLAN:  Smoking cessation  Orders Placed This Encounter  Procedures  . NMR, lipoprofile  . CMP14+EGFR  . POCT glycosylated hemoglobin (Hb A1C)  . POCT UA - Microalbumin    Meds ordered this encounter  Medications  . DISCONTD: metFORMIN (GLUCOPHAGE) 500 MG tablet    Sig: Take 500 mg by mouth 2 (two) times daily with a meal.  . lisinopril (PRINIVIL,ZESTRIL) 10 MG tablet    Sig: Take 1 tablet (10 mg total) by mouth daily.    Dispense:  90 tablet    Refill:  3    NTBS  . DISCONTD: metFORMIN (GLUCOPHAGE) 500 MG tablet    Sig: Take 1 tablet (500 mg total) by mouth 2 (two) times daily with a meal.    Dispense:  180 tablet    Refill:  3  . atorvastatin (LIPITOR) 10 MG tablet    Sig: Take 1 tablet (10 mg total) by mouth daily.    Dispense:  90 tablet    Refill:  3    Needs to be seen  . glipiZIDE (GLUCOTROL) 5 MG tablet    Sig: Take 1 tablet (5 mg total) by mouth daily.    Dispense:  90 tablet    Refill:  3    Needs to be seen         Dr Woodroe Mode Recommendations  For nutrition information, I recommend books:  1).Eat to Live by Dr Monico Hoar. 2).Prevent and Reverse  Heart Disease by Dr Suzzette Righter. 3) Dr Katherina Right Book:  Program to Reverse Diabetes  Exercise recommendations are:  If unable to walk, then the patient can exercise in a chair 3 times a day. By flapping arms like a bird gently and raising legs outwards to the front.  If ambulatory, the patient can go for walks for 30 minutes 3 times a week. Then increase the intensity and duration as tolerated.  Goal is to try to attain exercise frequency to 5 times a week.  If applicable: Best to perform resistance exercises (machines or weights) 2 days a week and cardio type exercises 3 days per week.  Handout the AVS and  Discussion on smoking cessation . Spent 15 minutes  Return in about 3  months (around 01/04/2013) for Recheck medical problems.  Belen Zwahlen P. [place] Charon, M.D.

## 2012-10-06 LAB — CMP14+EGFR
ALT: 12 IU/L (ref 0–44)
AST: 14 IU/L (ref 0–40)
Albumin/Globulin Ratio: 2.1 (ref 1.1–2.5)
Albumin: 4.5 g/dL (ref 3.6–4.8)
Alkaline Phosphatase: 84 IU/L (ref 39–117)
BUN/Creatinine Ratio: 14 (ref 10–22)
BUN: 13 mg/dL (ref 8–27)
CO2: 24 mmol/L (ref 18–29)
Calcium: 9.3 mg/dL (ref 8.6–10.2)
Chloride: 103 mmol/L (ref 97–108)
Creatinine, Ser: 0.9 mg/dL (ref 0.76–1.27)
GFR calc Af Amer: 103 mL/min/{1.73_m2} (ref >59)
GFR calc non Af Amer: 89 mL/min/{1.73_m2} (ref >59)
Globulin, Total: 2.1 g/dL (ref 1.5–4.5)
Glucose: 132 mg/dL — ABNORMAL HIGH (ref 65–99)
Potassium: 4.5 mmol/L (ref 3.5–5.2)
Sodium: 142 mmol/L (ref 134–144)
Total Bilirubin: 0.4 mg/dL (ref 0.0–1.2)
Total Protein: 6.6 g/dL (ref 6.0–8.5)

## 2012-10-06 LAB — NMR, LIPOPROFILE
Cholesterol: 141 mg/dL (ref <200)
HDL Cholesterol by NMR: 42 mg/dL (ref >=40)
HDL Particle Number: 29.6 umol/L — ABNORMAL LOW (ref >=30.5)
LDL Particle Number: 1085 nmol/L — ABNORMAL HIGH (ref <1000)
LDL Size: 20.6 nm (ref >20.5)
LDLC SERPL CALC-MCNC: 78 mg/dL (ref <100)
LP-IR Score: 53 — ABNORMAL HIGH (ref <=45)
Small LDL Particle Number: 548 nmol/L — ABNORMAL HIGH (ref <=527)
Triglycerides by NMR: 106 mg/dL (ref <150)

## 2013-01-10 ENCOUNTER — Encounter: Payer: Self-pay | Admitting: Family Medicine

## 2013-01-10 ENCOUNTER — Ambulatory Visit (INDEPENDENT_AMBULATORY_CARE_PROVIDER_SITE_OTHER): Payer: Medicare Other | Admitting: Family Medicine

## 2013-01-10 ENCOUNTER — Encounter (INDEPENDENT_AMBULATORY_CARE_PROVIDER_SITE_OTHER): Payer: Self-pay

## 2013-01-10 VITALS — BP 123/68 | HR 60 | Temp 98.5°F | Ht 67.0 in | Wt 193.8 lb

## 2013-01-10 DIAGNOSIS — I10 Essential (primary) hypertension: Secondary | ICD-10-CM

## 2013-01-10 DIAGNOSIS — F17200 Nicotine dependence, unspecified, uncomplicated: Secondary | ICD-10-CM

## 2013-01-10 DIAGNOSIS — Z720 Tobacco use: Secondary | ICD-10-CM

## 2013-01-10 DIAGNOSIS — E785 Hyperlipidemia, unspecified: Secondary | ICD-10-CM

## 2013-01-10 DIAGNOSIS — E119 Type 2 diabetes mellitus without complications: Secondary | ICD-10-CM

## 2013-01-10 DIAGNOSIS — N529 Male erectile dysfunction, unspecified: Secondary | ICD-10-CM

## 2013-01-10 LAB — POCT UA - MICROALBUMIN: Microalbumin Ur, POC: NEGATIVE mg/L

## 2013-01-10 MED ORDER — ASPIRIN EC 81 MG PO TBEC: 81.0000 mg | DELAYED_RELEASE_TABLET | Freq: Every day | ORAL | Status: DC

## 2013-01-10 MED ORDER — ATORVASTATIN CALCIUM 10 MG PO TABS: 10.0000 mg | ORAL_TABLET | Freq: Every day | ORAL | Status: DC

## 2013-01-10 MED ORDER — METFORMIN HCL 500 MG PO TABS: 500.0000 mg | ORAL_TABLET | Freq: Two times a day (BID) | ORAL | Status: DC

## 2013-01-10 MED ORDER — LISINOPRIL 10 MG PO TABS: 10.0000 mg | ORAL_TABLET | Freq: Every day | ORAL | Status: DC

## 2013-01-10 MED ORDER — GLIPIZIDE 5 MG PO TABS: 5.0000 mg | ORAL_TABLET | Freq: Every day | ORAL | Status: DC

## 2013-01-10 NOTE — Patient Instructions (Signed)
Diabetes and Foot Care Diabetes may cause you to have problems because of poor blood supply (circulation) to your feet and legs. This may cause the skin on your feet to become thinner, break easier, and heal more slowly. Your skin may become dry, and the skin may peel and crack. You may also have nerve damage in your legs and feet causing decreased feeling in them. You may not notice minor injuries to your feet that could lead to infections or more serious problems. Taking care of your feet is one of the most important things you can do for yourself.  HOME CARE INSTRUCTIONS  Wear shoes at all times, even in the house. Do not go barefoot. Bare feet are easily injured.  Check your feet daily for blisters, cuts, and redness. If you cannot see the bottom of your feet, use a mirror or ask someone for help.  Wash your feet with warm water (do not use hot water) and mild soap. Then pat your feet and the areas between your toes until they are completely dry. Do not soak your feet as this can dry your skin.  Apply a moisturizing lotion or petroleum jelly (that does not contain alcohol and is unscented) to the skin on your feet and to dry, brittle toenails. Do not apply lotion between your toes.  Trim your toenails straight across. Do not dig under them or around the cuticle. File the edges of your nails with an emery board or nail file.  Do not cut corns or calluses or try to remove them with medicine.  Wear clean socks or stockings every day. Make sure they are not too tight. Do not wear knee-high stockings since they may decrease blood flow to your legs.  Wear shoes that fit properly and have enough cushioning. To break in new shoes, wear them for just a few hours a day. This prevents you from injuring your feet. Always look in your shoes before you put them on to be sure there are no objects inside.  Do not cross your legs. This may decrease the blood flow to your feet.  If you find a minor scrape,  cut, or break in the skin on your feet, keep it and the skin around it clean and dry. These areas may be cleansed with mild soap and water. Do not cleanse the area with peroxide, alcohol, or iodine.  When you remove an adhesive bandage, be sure not to damage the skin around it.  If you have a wound, look at it several times a day to make sure it is healing.  Do not use heating pads or hot water bottles. They may burn your skin. If you have lost feeling in your feet or legs, you may not know it is happening until it is too late.  Make sure your health care provider performs a complete foot exam at least annually or more often if you have foot problems. Report any cuts, sores, or bruises to your health care provider immediately. SEEK MEDICAL CARE IF:   You have an injury that is not healing.  You have cuts or breaks in the skin.  You have an ingrown nail.  You notice redness on your legs or feet.  You feel burning or tingling in your legs or feet.  You have pain or cramps in your legs and feet.  Your legs or feet are numb.  Your feet always feel cold. SEEK IMMEDIATE MEDICAL CARE IF:   There is increasing redness,   swelling, or pain in or around a wound.  There is a red line that goes up your leg.  Pus is coming from a wound.  You develop a fever or as directed by your health care provider.  You notice a bad smell coming from an ulcer or wound. Document Released: 12/18/1999 Document Revised: 08/22/2012 Document Reviewed: 05/29/2012 [facility] Patient Information 2014 [place].        Dr Paula Libra Recommendations  For nutrition information, I recommend books:  1).Eat to Live by Dr Excell Seltzer. 2).Prevent and Reverse Heart Disease by Dr Karl Luke. 3) Dr Janene Harvey Book:  Program to Reverse Diabetes  Exercise recommendations are:  If unable to walk, then the patient can exercise in a chair 3 times a day. By flapping arms like a bird gently and  raising legs outwards to the front.  If ambulatory, the patient can go for walks for 30 minutes 3 times a week. Then increase the intensity and duration as tolerated.  Goal is to try to attain exercise frequency to 5 times a week.  If applicable: Best to perform resistance exercises (machines or weights) 2 days a week and cardio type exercises 3 days per week.   Smoking Cessation Quitting smoking is important to your health and has many advantages. However, it is not always easy to quit since nicotine is a very addictive drug. Often times, people try 3 times or more before being able to quit. This document explains the best ways for you to prepare to quit smoking. Quitting takes hard work and a lot of effort, but you can do it. ADVANTAGES OF QUITTING SMOKING  You will live longer, feel better, and live better.  Your body will feel the impact of quitting smoking almost immediately.  Within 20 minutes, blood pressure decreases. Your pulse returns to its normal level.  After 8 hours, carbon monoxide levels in the blood return to normal. Your oxygen level increases.  After 24 hours, the chance of having a heart attack starts to decrease. Your breath, hair, and body stop smelling like smoke.  After 48 hours, damaged nerve endings begin to recover. Your sense of taste and smell improve.  After 72 hours, the body is virtually free of nicotine. Your bronchial tubes relax and breathing becomes easier.  After 2 to 12 weeks, lungs can hold more air. Exercise becomes easier and circulation improves.  The risk of having a heart attack, stroke, cancer, or lung disease is greatly reduced.  After 1 year, the risk of coronary heart disease is cut in half.  After 5 years, the risk of stroke falls to the same as a nonsmoker.  After 10 years, the risk of lung cancer is cut in half and the risk of other cancers decreases significantly.  After 15 years, the risk of coronary heart disease drops,  usually to the level of a nonsmoker.  If you are pregnant, quitting smoking will improve your chances of having a healthy baby.  The people you live with, especially any children, will be healthier.  You will have extra money to spend on things other than cigarettes. QUESTIONS TO THINK ABOUT BEFORE ATTEMPTING TO QUIT You may want to talk about your answers with your caregiver.  Why do you want to quit?  If you tried to quit in the past, what helped and what did not?  What will be the most difficult situations for you after you quit? How will you plan to handle them?  Who  can help you through the tough times? Your family? Friends? A caregiver?  What pleasures do you get from smoking? What ways can you still get pleasure if you quit? Here are some questions to ask your caregiver:  How can you help me to be successful at quitting?  What medicine do you think would be best for me and how should I take it?  What should I do if I need more help?  What is smoking withdrawal like? How can I get information on withdrawal? GET READY  Set a quit date.  Change your environment by getting rid of all cigarettes, ashtrays, matches, and lighters in your home, car, or work. Do not let people smoke in your home.  Review your past attempts to quit. Think about what worked and what did not. GET SUPPORT AND ENCOURAGEMENT You have a better chance of being successful if you have help. You can get support in many ways.  Tell your family, friends, and co-workers that you are going to quit and need their support. Ask them not to smoke around you.  Get individual, group, or telephone counseling and support. Programs are available at [employer] and health centers. Call your local health department for information about programs in your area.  Spiritual beliefs and practices may help some smokers quit.  Download a "quit meter" on your computer to keep track of quit statistics, such as how long  you have gone without smoking, cigarettes not smoked, and money saved.  Get a self-help book about quitting smoking and staying off of tobacco. [place] yourself from urges to smoke. Talk to someone, go for a walk, or occupy your time with a task.  Change your normal routine. Take a different route to work. Drink tea instead of coffee. Eat breakfast in a different place.  Reduce your stress. Take a hot bath, exercise, or read a book.  Plan something enjoyable to do every day. Reward yourself for not smoking.  Explore interactive web-based programs that specialize in helping you quit. GET MEDICINE AND USE IT CORRECTLY Medicines can help you stop smoking and decrease the urge to smoke. Combining medicine with the above behavioral methods and support can greatly increase your chances of successfully quitting smoking.  Nicotine replacement therapy helps deliver nicotine to your body without the negative effects and risks of smoking. Nicotine replacement therapy includes nicotine gum, lozenges, inhalers, nasal sprays, and skin patches. Some may be available over-the-counter and others require a prescription.  Antidepressant medicine helps people abstain from smoking, but how this works is unknown. This medicine is available by prescription.  Nicotinic receptor partial agonist medicine simulates the effect of nicotine in your brain. This medicine is available by prescription. Ask your caregiver for advice about which medicines to use and how to use them based on your health history. Your caregiver will tell you what side effects to look out for if you choose to be on a medicine or therapy. Carefully read the information on the package. Do not use any other product containing nicotine while using a nicotine replacement product.  RELAPSE OR DIFFICULT SITUATIONS Most relapses occur within the first 3 months after quitting. Do not be discouraged if you start smoking  again. Remember, most people try several times before finally quitting. You may have symptoms of withdrawal because your body is used to nicotine. You may crave cigarettes, be irritable, feel very hungry, cough often, get headaches, or have difficulty concentrating. The withdrawal  symptoms are only temporary. They are strongest when you first quit, but they will go away within 10 14 days. To reduce the chances of relapse, try to:  Avoid drinking alcohol. Drinking lowers your chances of successfully quitting.  Reduce the amount of caffeine you consume. Once you quit smoking, the amount of caffeine in your body increases and can give you symptoms, such as a rapid heartbeat, sweating, and anxiety.  Avoid smokers because they can make you want to smoke.  Do not let weight gain distract you. Many smokers will gain weight when they quit, usually less than 10 pounds. Eat a healthy diet and stay active. You can always lose the weight gained after you quit.  Find ways to improve your mood other than smoking. FOR MORE INFORMATION  www.smokefree.gov  Document Released: 12/14/2000 Document Revised: 06/21/2011 Document Reviewed: 03/31/2011 [facility] Patient Information 2014 [place], [place].

## 2013-01-10 NOTE — Progress Notes (Signed)
Patient ID: Dalton Miller, male   DOB: 1945/03/03, 68 y.o.   MRN: [id number] SUBJECTIVE: CC: Chief Complaint  Patient presents with  . Follow-up    3 m onth     HPI: Patient is here for follow up of Diabetes Mellitus/HLD/Tob user/HTN/ED: Symptoms evaluated: Denies Nocturia ,Denies Urinary Frequency , denies Blurred vision ,deniesDizziness,denies.Dysuria,denies paresthesias, denies extremity pain or ulcers.Marland Kitchendenies chest pain. has had an annual eye exam. do check the feet. Does check CBGs. Average CBG:had labs at the [facility] running 120 to 127 Denies episodes of hypoglycemia. Does have an emergency hypoglycemic plan. admits toCompliance with medications. Denies Problems with medications.   Had labs at the [facility] December 19, 2012 Patient brought labs. Copy to be scanned into chart  Past Medical History  Diagnosis Date  . Cellulitis 03/16/10    resolved in left wrist   . RUQ pain     resolved   . Diabetes mellitus   . Hypertension   . Hyperlipidemia   . Abscess   . Erectile dysfunction   . Tobacco user    No past surgical history on file. History   Social History  . Marital Status: Married    Spouse Name: N/A    Number of Children: N/A  . Years of Education: N/A   Occupational History  . Not on file.   Social History Main Topics  . Smoking status: Current Every Day Smoker  . Smokeless tobacco: Not on file  . Alcohol Use: No  . Drug Use: No  . Sexual Activity: Not on file   Other Topics Concern  . Not on file   Social History Narrative  . No narrative on file   No family history on file. No current outpatient prescriptions on file prior to visit.   No current facility-administered medications on file prior to visit.   No Known Allergies Immunization History  Administered Date(s) Administered  . Influenza Whole 10/04/2007  . Pneumococcal Polysaccharide-23 10/04/2007  . Tdap 01/03/2006   Prior to Admission medications   Medication Sig Start Date End Date  Taking? Authorizing Provider  atorvastatin (LIPITOR) 10 MG tablet Take 1 tablet (10 mg total) by mouth daily. 10/04/12  Yes Vernie Shanks, MD  glipiZIDE (GLUCOTROL) 5 MG tablet Take 1 tablet (5 mg total) by mouth daily. 10/04/12  Yes Vernie Shanks, MD  lisinopril (PRINIVIL,ZESTRIL) 10 MG tablet Take 1 tablet (10 mg total) by mouth daily. 10/04/12  Yes Vernie Shanks, MD  metFORMIN (GLUCOPHAGE) 500 MG tablet Take 1 tablet (500 mg total) by mouth 2 (two) times daily with a meal. 10/04/12  Yes Vernie Shanks, MD     ROS: As above in the HPI. All other systems are stable or negative.  OBJECTIVE: APPEARANCE:  Patient in no acute distress.The patient appeared well nourished and normally developed. Acyanotic. Waist: VITAL SIGNS:BP 123/68  Pulse 60  Temp(Src) 98.5 F (36.9 C) (Oral)  Ht 5\' 7"  (1.702 m)  Wt 193 lb 12.8 oz (87.907 kg)  BMI 30.35 kg/m2 WM obese centrally Gained weight  SKIN: warm and  Dry without overt rashes, tattoos and scars  HEAD and Neck: without JVD, Head and scalp: normal Eyes:No scleral icterus. Fundi normal, eye movements normal. Ears: Auricle normal, canal normal, Tympanic membranes normal, insufflation normal. Nose: normal Throat: normal Neck & thyroid: normal  CHEST & LUNGS: Chest wall: normal Lungs: Corase breath sounds. No Rales no wheezes  CVS: Reveals the PMI to be normally located. Regular rhythm, First  and Second Heart sounds are normal,  absence of murmurs, rubs or gallops. Peripheral vasculature: Radial pulses: normal Dorsal pedis pulses: normal Posterior pulses: normal  ABDOMEN:  Appearance: Obese Benign, no organomegaly, no masses, no Abdominal Aortic enlargement. No Guarding , no rebound. No Bruits. Bowel sounds: normal  RECTAL: N/A GU: N/A  EXTREMETIES: nonedematous.  MUSCULOSKELETAL:  Spine: normal Joints: intact  NEUROLOGIC: oriented to time,place and person; nonfocal. Strength is normal Sensory is normal Reflexes are  normal Cranial Nerves are normal.   Labs from the [place] reviewed: HGbA1C is 7.1  ASSESSMENT: Erectile dysfunction  Tobacco user  Hypertension - Plan: lisinopril (PRINIVIL,ZESTRIL) 10 MG tablet  Diabetes mellitus - Plan: POCT UA - Microalbumin, glipiZIDE (GLUCOTROL) 5 MG tablet, metFORMIN (GLUCOPHAGE) 500 MG tablet, aspirin EC 81 MG tablet  Hyperlipidemia - Plan: atorvastatin (LIPITOR) 10 MG tablet  PLAN: DM foot care discussed  Smoking cessation in the AVS and  Discussed      Dr Paula Libra Recommendations  For nutrition information, I recommend books:  1).Eat to Live by Dr Excell Seltzer. 2).Prevent and Reverse Heart Disease by Dr Karl Luke. 3) Dr Janene Harvey Book:  Program to Reverse Diabetes  Exercise recommendations are:  If unable to walk, then the patient can exercise in a chair 3 times a day. By flapping arms like a bird gently and raising legs outwards to the front.  If ambulatory, the patient can go for walks for 30 minutes 3 times a week. Then increase the intensity and duration as tolerated.  Goal is to try to attain exercise frequency to 5 times a week.  If applicable: Best to perform resistance exercises (machines or weights) 2 days a week and cardio type exercises 3 days per week.  Significant LTC needed for healthier lifestyle. And to reduce risk factors. Advised to be on a baby aspirin daily.    Orders Placed This Encounter  Procedures  . POCT UA - Microalbumin   Meds ordered this encounter  Medications  . glipiZIDE (GLUCOTROL) 5 MG tablet    Sig: Take 1 tablet (5 mg total) by mouth daily.    Dispense:  90 tablet    Refill:  3    Needs to be seen  . metFORMIN (GLUCOPHAGE) 500 MG tablet    Sig: Take 1 tablet (500 mg total) by mouth 2 (two) times daily with a meal.    Dispense:  180 tablet    Refill:  3  . atorvastatin (LIPITOR) 10 MG tablet    Sig: Take 1 tablet (10 mg total) by mouth daily.    Dispense:  90 tablet    Refill:  3     Needs to be seen  . lisinopril (PRINIVIL,ZESTRIL) 10 MG tablet    Sig: Take 1 tablet (10 mg total) by mouth daily.    Dispense:  90 tablet    Refill:  3    NTBS  . aspirin EC 81 MG tablet    Sig: Take 1 tablet (81 mg total) by mouth daily.    Dispense:  30 tablet    Refill:  11   Medications Discontinued During This Encounter  Medication Reason  . glipiZIDE (GLUCOTROL) 5 MG tablet Reorder  . metFORMIN (GLUCOPHAGE) 500 MG tablet Reorder  . atorvastatin (LIPITOR) 10 MG tablet Reorder  . lisinopril (PRINIVIL,ZESTRIL) 10 MG tablet Reorder   Return in about 3 months (around 04/10/2013) for Recheck medical problems.  Cerria Randhawa P. Jacelyn Grip, M.D.

## 2013-01-12 NOTE — Progress Notes (Signed)
Quick Note:  Call patient. Labs normal.no microalbumin in the urine No change in plan. ______

## 2013-01-14 ENCOUNTER — Telehealth: Payer: Self-pay | Admitting: Family Medicine

## 2013-01-14 NOTE — Telephone Encounter (Signed)
Message copied by Waverly Ferrari on Mon Jan 14, 2013 11:52 AM ------      Message from: Vernie Shanks      Created: Sat Jan 12, 2013  3:57 PM       Call patient.      Labs normal.no microalbumin in the urine      No change in plan. ------

## 2013-05-14 ENCOUNTER — Encounter (HOSPITAL_COMMUNITY): Payer: Self-pay | Admitting: Emergency Medicine

## 2013-05-14 ENCOUNTER — Emergency Department (HOSPITAL_COMMUNITY)
Admission: EM | Admit: 2013-05-14 | Discharge: 2013-05-14 | Disposition: A | Discharge status: Home / Self Care | Payer: Medicare Other | Attending: Emergency Medicine | Admitting: Emergency Medicine

## 2013-05-14 DIAGNOSIS — E119 Type 2 diabetes mellitus without complications: Secondary | ICD-10-CM | POA: Insufficient documentation

## 2013-05-14 DIAGNOSIS — R05 Cough: Secondary | ICD-10-CM | POA: Diagnosis not present

## 2013-05-14 DIAGNOSIS — S335XXA Sprain of ligaments of lumbar spine, initial encounter: Secondary | ICD-10-CM | POA: Insufficient documentation

## 2013-05-14 DIAGNOSIS — R059 Cough, unspecified: Secondary | ICD-10-CM | POA: Insufficient documentation

## 2013-05-14 DIAGNOSIS — I10 Essential (primary) hypertension: Secondary | ICD-10-CM | POA: Diagnosis not present

## 2013-05-14 DIAGNOSIS — F17200 Nicotine dependence, unspecified, uncomplicated: Secondary | ICD-10-CM | POA: Insufficient documentation

## 2013-05-14 DIAGNOSIS — Y9389 Activity, other specified: Secondary | ICD-10-CM | POA: Diagnosis not present

## 2013-05-14 DIAGNOSIS — R062 Wheezing: Secondary | ICD-10-CM | POA: Diagnosis not present

## 2013-05-14 DIAGNOSIS — R0602 Shortness of breath: Secondary | ICD-10-CM | POA: Insufficient documentation

## 2013-05-14 DIAGNOSIS — IMO0002 Reserved for concepts with insufficient information to code with codable children: Secondary | ICD-10-CM | POA: Diagnosis present

## 2013-05-14 DIAGNOSIS — Z87448 Personal history of other diseases of urinary system: Secondary | ICD-10-CM | POA: Diagnosis not present

## 2013-05-14 DIAGNOSIS — Y929 Unspecified place or not applicable: Secondary | ICD-10-CM | POA: Insufficient documentation

## 2013-05-14 DIAGNOSIS — Z872 Personal history of diseases of the skin and subcutaneous tissue: Secondary | ICD-10-CM | POA: Insufficient documentation

## 2013-05-14 DIAGNOSIS — Z79899 Other long term (current) drug therapy: Secondary | ICD-10-CM | POA: Diagnosis not present

## 2013-05-14 DIAGNOSIS — E785 Hyperlipidemia, unspecified: Secondary | ICD-10-CM | POA: Insufficient documentation

## 2013-05-14 DIAGNOSIS — S39012A Strain of muscle, fascia and tendon of lower back, initial encounter: Secondary | ICD-10-CM

## 2013-05-14 DIAGNOSIS — X500XXA Overexertion from strenuous movement or load, initial encounter: Secondary | ICD-10-CM | POA: Diagnosis not present

## 2013-05-14 MED ORDER — ONDANSETRON HCL 4 MG PO TABS
4.0000 mg | ORAL_TABLET | Freq: Once | ORAL | Status: AC
  Administered 2013-05-14: 4 mg via ORAL
  Filled 2013-05-14: qty 1

## 2013-05-14 MED ORDER — HYDROCODONE-ACETAMINOPHEN 5-325 MG PO TABS: 1.0000 | ORAL_TABLET | ORAL | Status: DC | PRN

## 2013-05-14 MED ORDER — METHOCARBAMOL 500 MG PO TABS
1000.0000 mg | ORAL_TABLET | Freq: Once | ORAL | Status: AC
  Administered 2013-05-14: 1000 mg via ORAL
  Filled 2013-05-14: qty 2

## 2013-05-14 MED ORDER — METHOCARBAMOL 500 MG PO TABS: 500.0000 mg | ORAL_TABLET | Freq: Three times a day (TID) | ORAL | Status: DC

## 2013-05-14 MED ORDER — HYDROCODONE-ACETAMINOPHEN 5-325 MG PO TABS
2.0000 | ORAL_TABLET | Freq: Once | ORAL | Status: AC
  Administered 2013-05-14: 2 via ORAL
  Filled 2013-05-14: qty 2

## 2013-05-14 NOTE — ED Provider Notes (Signed)
Patient reports no prior episodes of back problems. Today about 1:00 he was in [employer] and he reached for a small box and when he turned back to put in but he had acute onset of left-sided lower back pain. He states it hurts with any range of motion. Patient is nontender the lumbar spine. He is tender over the left SI joint and the large paraspinous muscles of the lumbar spine on the left. She does not want to do range of motion because it causes pain  Medical screening examination/treatment/procedure(s) were conducted as a shared visit with non-physician practitioner(s) and myself.  I personally evaluated the patient during the encounter.   EKG Interpretation None       Rolland Porter, MD, Abram Sander   Janice Norrie, MD 05/14/13 2259

## 2013-05-14 NOTE — ED Notes (Signed)
Pt was standing in [employer], turned to place something in cart, and had severe pain  In lt lower back. Pain with motion

## 2013-05-14 NOTE — Discharge Instructions (Signed)
Your examination is consistent with a strain muscle of your lower back. Please use a heating pad when sitting or lying down. Please use Robaxin 3 times daily for spasm, use Tylenol Extra Strength for mild pain, use Norco for more severe pain. Robaxin and Norco may cause drowsiness, please use with caution. Please see your primary physician for additional evaluation if not improving. Muscle Strain A muscle strain is an injury that occurs when a muscle is stretched beyond its normal length. Usually a small number of muscle fibers are torn when this happens. Muscle strain is rated in degrees. First-degree strains have the least amount of muscle fiber tearing and pain. Second-degree and third-degree strains have increasingly more tearing and pain.  Usually, recovery from muscle strain takes 1 2 weeks. Complete healing takes 5 6 weeks.  CAUSES  Muscle strain happens when a sudden, violent force placed on a muscle stretches it too far. This may occur with lifting, sports, or a fall.  RISK FACTORS Muscle strain is especially common in athletes.  SIGNS AND SYMPTOMS At the site of the muscle strain, there may be:  Pain.  Bruising.  Swelling.  Difficulty using the muscle due to pain or lack of normal function. DIAGNOSIS  Your health care provider will perform a physical exam and ask about your medical history. TREATMENT  Often, the best treatment for a muscle strain is resting, icing, and applying cold compresses to the injured area.  HOME CARE INSTRUCTIONS   Use the PRICE method of treatment to promote muscle healing during the first 2 3 days after your injury. The PRICE method involves:  Protecting the muscle from being injured again.  Restricting your activity and resting the injured body part.  Icing your injury. To do this, put ice in a plastic bag. Place a towel between your skin and the bag. Then, apply the ice and leave it on from 15 20 minutes each hour. After the third day, switch to  moist heat packs.  Apply compression to the injured area with a splint or elastic bandage. Be careful not to wrap it too tightly. This may interfere with blood circulation or increase swelling.  Elevate the injured body part above the level of your heart as often as you can.  Only take over-the-counter or prescription medicines for pain, discomfort, or fever as directed by your health care provider.  Warming up prior to exercise helps to prevent future muscle strains. SEEK MEDICAL CARE IF:   You have increasing pain or swelling in the injured area.  You have numbness, tingling, or a significant loss of strength in the injured area. MAKE SURE YOU:   Understand these instructions.  Will watch your condition.  Will get help right away if you are not doing well or get worse. Document Released: 12/20/2004 Document Revised: 10/10/2012 Document Reviewed: 07/19/2012 [facility] Patient Information 2014 [place], [place].

## 2013-05-14 NOTE — ED Provider Notes (Signed)
CSN: [id number]     Arrival date & time 05/14/13  1833 History   First MD Initiated Contact with Patient 05/14/13 2134     Chief Complaint  Patient presents with  . Back Pain     (Consider location/radiation/quality/duration/timing/severity/associated sxs/prior Treatment) HPI Comments: The patient is a 68 year old male who presents to the emergency department with left lower back pain. The patient states he was in his usual state of health until earlier this evening he was at a local grocery store, he reached to get a box of cereal and when he twisted he had a very sharp pain in his left lower back. He states he's had this pain since that time. He did not have any loss of bowel or bladder function. He's not had any weakness of his lower extremities. There's been no falls or injury since the sharp pain. No previous operations or procedures involving the back. The patient has tried rest and Tylenol without any success. He presents at this time for assistance with this sharp pain.  Patient is a 68 y.o. male presenting with back pain. The history is provided by the patient.  Back Pain Associated symptoms: no abdominal pain, no chest pain and no dysuria     Past Medical History  Diagnosis Date  . Cellulitis 03/16/10    resolved in left wrist   . RUQ pain     resolved   . Diabetes mellitus   . Hypertension   . Hyperlipidemia   . Abscess   . Erectile dysfunction   . Tobacco user    Past Surgical History  Procedure Laterality Date  . Replacement total knee    . Hand surgery Right   . Carpal tunnel release Right    History reviewed. No pertinent family history. History  Substance Use Topics  . Smoking status: Current Every Day Smoker  . Smokeless tobacco: Not on file  . Alcohol Use: No    Review of Systems  Constitutional: Negative for activity change.       All ROS Neg except as noted in HPI  HENT: Negative for nosebleeds.   Eyes: Negative for photophobia and discharge.    Respiratory: Positive for cough and shortness of breath. Negative for wheezing.   Cardiovascular: Negative for chest pain and palpitations.  Gastrointestinal: Negative for abdominal pain and blood in stool.  Genitourinary: Negative for dysuria, frequency and hematuria.  Musculoskeletal: Positive for back pain. Negative for arthralgias and neck pain.  Skin: Negative.   Neurological: Negative for dizziness, seizures and speech difficulty.  Psychiatric/Behavioral: Negative for hallucinations and confusion.      Allergies  Review of patient's allergies indicates no known allergies.  Home Medications   Prior to Admission medications   Medication Sig Start Date End Date Taking? Authorizing Provider  atorvastatin (LIPITOR) 10 MG tablet Take 1 tablet (10 mg total) by mouth daily. 01/10/13  Yes Vernie Shanks, MD  glipiZIDE (GLUCOTROL) 5 MG tablet Take 1 tablet (5 mg total) by mouth daily. 01/10/13  Yes Vernie Shanks, MD  lisinopril (PRINIVIL,ZESTRIL) 10 MG tablet Take 1 tablet (10 mg total) by mouth daily. 01/10/13  Yes Vernie Shanks, MD  metFORMIN (GLUCOPHAGE) 500 MG tablet Take 1 tablet (500 mg total) by mouth 2 (two) times daily with a meal. 01/10/13  Yes Vernie Shanks, MD   BP 129/76  Pulse 69  Temp(Src) 97.4 F (36.3 C) (Oral)  Resp 16  SpO2 98% Physical Exam  Nursing note and vitals reviewed.  Constitutional: He is oriented to person, place, and time. He appears well-developed and well-nourished.  Non-toxic appearance.  HENT:  Head: Normocephalic.  Right Ear: Tympanic membrane and external ear normal.  Left Ear: Tympanic membrane and external ear normal.  Eyes: EOM and lids are normal. Pupils are equal, round, and reactive to light.  Neck: Normal range of motion. Neck supple. Carotid bruit is not present.  Cardiovascular: Normal rate, regular rhythm, normal heart sounds, intact distal pulses and normal pulses.   Pulmonary/Chest: No accessory muscle usage. No respiratory distress. He  has wheezes. He has rhonchi.  Abdominal: Soft. Bowel sounds are normal. There is no tenderness. There is no guarding.  Musculoskeletal: Normal range of motion.       Back:  No palpable step off of the lumbar spine. No hot areas appreciated. There is pain and some palpable spasm of the left paraspinous area of the lumbar region.  Lymphadenopathy:       Head (right side): No submandibular adenopathy present.       Head (left side): No submandibular adenopathy present.    He has no cervical adenopathy.  Neurological: He is alert and oriented to person, place, and time. He has normal strength. No cranial nerve deficit or sensory deficit. He exhibits normal muscle tone. Coordination and gait normal.  Skin: Skin is warm and dry.  Psychiatric: He has a normal mood and affect. His speech is normal.    ED Course  Procedures (including critical care time) Labs Review Labs Reviewed - No data to display  Imaging Review No results found.   EKG Interpretation None      MDM Patient sustained a twisting injury to the lower back. He complains of pain of the left lower back area. No gross neurologic deficit appreciated. Some palpable pain and spasm of the left paraspinous area of the lumbar region.  The plan at this time is for the patient be treated with heating pad to the lower back. Prescription for Robaxin and Norco given to the patient. The patient has been given warning that these medications may cause drowsiness, and to use caution with change of position or getting around. He will see his primary physician for additional evaluation and management if not improving.    Final diagnoses:  None    **I have reviewed nursing notes, vital signs, and all appropriate lab and imaging results for this patient.Lenox Ahr, PA-C 05/14/13 2255

## 2013-05-14 NOTE — ED Notes (Signed)
Patient complaining of left lower back pain that started this afternoon. States happened after turning from one position to another.

## 2013-08-01 ENCOUNTER — Telehealth: Payer: Self-pay | Admitting: Family

## 2013-08-01 NOTE — Telephone Encounter (Signed)
Appt scheduled

## 2013-08-05 ENCOUNTER — Encounter: Payer: Self-pay | Admitting: Family

## 2013-08-05 ENCOUNTER — Ambulatory Visit (INDEPENDENT_AMBULATORY_CARE_PROVIDER_SITE_OTHER): Payer: Medicare Other | Admitting: Family

## 2013-08-05 VITALS — BP 113/72 | HR 56 | Temp 97.7°F | Ht 67.0 in | Wt 188.6 lb

## 2013-08-05 DIAGNOSIS — Z1321 Encounter for screening for nutritional disorder: Secondary | ICD-10-CM

## 2013-08-05 DIAGNOSIS — Z125 Encounter for screening for malignant neoplasm of prostate: Secondary | ICD-10-CM

## 2013-08-05 DIAGNOSIS — E785 Hyperlipidemia, unspecified: Secondary | ICD-10-CM

## 2013-08-05 DIAGNOSIS — Z720 Tobacco use: Secondary | ICD-10-CM

## 2013-08-05 DIAGNOSIS — E119 Type 2 diabetes mellitus without complications: Secondary | ICD-10-CM

## 2013-08-05 DIAGNOSIS — F17200 Nicotine dependence, unspecified, uncomplicated: Secondary | ICD-10-CM

## 2013-08-05 DIAGNOSIS — I10 Essential (primary) hypertension: Secondary | ICD-10-CM

## 2013-08-05 LAB — POCT GLYCOSYLATED HEMOGLOBIN (HGB A1C): Hemoglobin A1C: 6.3

## 2013-08-05 MED ORDER — LISINOPRIL 10 MG PO TABS: 10.0000 mg | ORAL_TABLET | Freq: Every day | ORAL | Status: DC

## 2013-08-05 MED ORDER — METFORMIN HCL 500 MG PO TABS: 500.0000 mg | ORAL_TABLET | Freq: Two times a day (BID) | ORAL | Status: DC

## 2013-08-05 MED ORDER — GLIPIZIDE 5 MG PO TABS: 5.0000 mg | ORAL_TABLET | Freq: Every day | ORAL | Status: DC

## 2013-08-05 MED ORDER — ATORVASTATIN CALCIUM 10 MG PO TABS: 10.0000 mg | ORAL_TABLET | Freq: Every day | ORAL | Status: DC

## 2013-08-05 NOTE — Progress Notes (Signed)
Subjective:    Patient ID: Dalton Miller, male    DOB: 1945/08/11, 68 y.o.   MRN: [id number]  Diabetes He presents for his follow-up diabetic visit. He has type 2 diabetes mellitus. His disease course has been fluctuating. Pertinent negatives for hypoglycemia include no confusion, dizziness or mood changes. Pertinent negatives for diabetes include no foot paresthesias, no foot ulcerations and no visual change. Pertinent negatives for diabetic complications include no heart disease, nephropathy or peripheral neuropathy. Risk factors for coronary artery disease include dyslipidemia, male sex and tobacco exposure. Current diabetic treatment includes oral agent (dual therapy). He is compliant with treatment all of the time. He is following a generally healthy diet. His breakfast blood glucose range is generally 110-130 mg/dl. An ACE inhibitor/angiotensin II receptor blocker is being taken. Eye exam is current ("A month ago").  Hyperlipidemia This is a chronic problem. The current episode started more than 1 year ago. The problem is uncontrolled. Recent lipid tests were reviewed and are high. Exacerbating diseases include diabetes. He has no history of hypothyroidism. Factors aggravating his hyperlipidemia include smoking and fatty foods. Pertinent negatives include no leg pain or myalgias. Current antihyperlipidemic treatment includes statins. The current treatment provides moderate improvement of lipids. Risk factors for coronary artery disease include dyslipidemia, diabetes mellitus, family history, male sex and a sedentary lifestyle.      Review of Systems  Constitutional: Negative.   HENT: Negative.   Respiratory: Negative.   Cardiovascular: Negative.   Gastrointestinal: Negative.   Endocrine: Negative.   Genitourinary: Negative.   Musculoskeletal: Negative.  Negative for myalgias.  Neurological: Negative.  Negative for dizziness.  Hematological: Negative.   Psychiatric/Behavioral: Negative.   Negative for confusion.  All other systems reviewed and are negative.      Objective:   Physical Exam  Vitals reviewed. Constitutional: He is oriented to person, place, and time. He appears well-developed and well-nourished. No distress.  HENT:  Head: Normocephalic.  Right Ear: External ear normal.  Left Ear: External ear normal.  Nose: Nose normal.  Mouth/Throat: Oropharynx is clear and moist.  Eyes: Pupils are equal, round, and reactive to light. Right eye exhibits no discharge. Left eye exhibits no discharge.  Neck: Normal range of motion. Neck supple. No thyromegaly present.  Cardiovascular: Normal rate, regular rhythm, normal heart sounds and intact distal pulses.   No murmur heard. Pulmonary/Chest: Effort normal and breath sounds normal. No respiratory distress. He has no wheezes.  Abdominal: Soft. Bowel sounds are normal. He exhibits no distension. There is no tenderness.  Musculoskeletal: Normal range of motion. He exhibits no edema and no tenderness.  Neurological: He is alert and oriented to person, place, and time. He has normal reflexes. No cranial nerve deficit.  Skin: Skin is warm and dry. No rash noted. No erythema.  Psychiatric: He has a normal mood and affect. His behavior is normal. Judgment and thought content normal.    BP 113/72  Pulse 56  Temp(Src) 97.7 F (36.5 C) (Oral)  Ht '5\' 7"'  (1.702 m)  Wt 188 lb 9.6 oz (85.548 kg)  BMI 29.53 kg/m2       Assessment & Plan:  1. Essential hypertension - CMP14+EGFR - lisinopril (PRINIVIL,ZESTRIL) 10 MG tablet; Take 1 tablet (10 mg total) by mouth daily.  Dispense: 90 tablet; Refill: 3  2. Hyperlipidemia - Lipid panel - atorvastatin (LIPITOR) 10 MG tablet; Take 1 tablet (10 mg total) by mouth daily.  Dispense: 90 tablet; Refill: 3  3. Tobacco user  4.  Type 2 diabetes mellitus without complication  - POCT glycosylated hemoglobin (Hb A1C) - glipiZIDE (GLUCOTROL) 5 MG tablet; Take 1 tablet (5 mg total) by mouth  daily.  Dispense: 90 tablet; Refill: 3 - metFORMIN (GLUCOPHAGE) 500 MG tablet; Take 1 tablet (500 mg total) by mouth 2 (two) times daily with a meal.  Dispense: 180 tablet; Refill: 3  5. Prostate cancer screening - PSA, total and free  6. Encounter for vitamin deficiency screening - Vit D  25 hydroxy (rtn osteoporosis monitoring)   Continue all meds Labs pending Health Maintenance reviewed Diet and exercise encouraged RTO 6 months  Evelina Dun, FNP

## 2013-08-05 NOTE — Patient Instructions (Addendum)
Health Maintenance A healthy lifestyle and preventative care can promote health and wellness.  Maintain regular health, dental, and eye exams.  Eat a healthy diet. Foods like vegetables, fruits, whole grains, low-fat dairy products, and lean protein foods contain the nutrients you need and are low in calories. Decrease your intake of foods high in solid fats, added sugars, and salt. Get information about a proper diet from your health care provider, if necessary.  Regular physical exercise is one of the most important things you can do for your health. Most adults should get at least 150 minutes of moderate-intensity exercise (any activity that increases your heart rate and causes you to sweat) each week. In addition, most adults need muscle-strengthening exercises on 2 or more days a week.   Maintain a healthy weight. The body mass index (BMI) is a screening tool to identify possible weight problems. It provides an estimate of body fat based on height and weight. Your health care provider can find your BMI and can help you achieve or maintain a healthy weight. For males 20 years and older:  A BMI below 18.5 is considered underweight.  A BMI of 18.5 to 24.9 is normal.  A BMI of 25 to 29.9 is considered overweight.  A BMI of 30 and above is considered obese.  Maintain normal blood lipids and cholesterol by exercising and minimizing your intake of saturated fat. Eat a balanced diet with plenty of fruits and vegetables. Blood tests for lipids and cholesterol should begin at age 20 and be repeated every 5 years. If your lipid or cholesterol levels are high, you are over age 50, or you are at high risk for heart disease, you may need your cholesterol levels checked more frequently.Ongoing high lipid and cholesterol levels should be treated with medicines if diet and exercise are not working.  If you smoke, find out from your health care provider how to quit. If you do not use tobacco, do not  start.  Lung cancer screening is recommended for adults aged 55-80 years who are at high risk for developing lung cancer because of a history of smoking. A yearly low-dose CT scan of the lungs is recommended for people who have at least a 30-pack-year history of smoking and are current smokers or have quit within the past 15 years. A pack year of smoking is smoking an average of 1 pack of cigarettes a day for 1 year (for example, a 30-pack-year history of smoking could mean smoking 1 pack a day for 30 years or 2 packs a day for 15 years). Yearly screening should continue until the smoker has stopped smoking for at least 15 years. Yearly screening should be stopped for people who develop a health problem that would prevent them from having lung cancer treatment.  If you choose to drink alcohol, do not have more than 2 drinks per day. One drink is considered to be 12 oz (360 mL) of beer, 5 oz (150 mL) of wine, or 1.5 oz (45 mL) of liquor.  Avoid the use of street drugs. Do not share needles with anyone. Ask for help if you need support or instructions about stopping the use of drugs.  High blood pressure causes heart disease and increases the risk of stroke. Blood pressure should be checked at least every 1-2 years. Ongoing high blood pressure should be treated with medicines if weight loss and exercise are not effective.  If you are 45-79 years old, ask your health care provider if   you should take aspirin to prevent heart disease.  Diabetes screening involves taking a blood sample to check your fasting blood sugar level. This should be done once every 3 years after age 45 if you are at a normal weight and without risk factors for diabetes. Testing should be considered at a younger age or be carried out more frequently if you are overweight and have at least 1 risk factor for diabetes.  Colorectal cancer can be detected and often prevented. Most routine colorectal cancer screening begins at the age of 50  and continues through age 75. However, your health care provider may recommend screening at an earlier age if you have risk factors for colon cancer. On a yearly basis, your health care provider may provide home test kits to check for hidden blood in the stool. A small camera at the end of a tube may be used to directly examine the colon (sigmoidoscopy or colonoscopy) to detect the earliest forms of colorectal cancer. Talk to your health care provider about this at age 50 when routine screening begins. A direct exam of the colon should be repeated every 5-10 years through age 75, unless early forms of precancerous polyps or small growths are found.  People who are at an increased risk for hepatitis B should be screened for this virus. You are considered at high risk for hepatitis B if:  You were born in a country where hepatitis B occurs often. Talk with your health care provider about which countries are considered high risk.  Your parents were born in a high-risk country and you have not received a shot to protect against hepatitis B (hepatitis B vaccine).  You have HIV or AIDS.  You use needles to inject street drugs.  You live with, or have sex with, someone who has hepatitis B.  You are a man who has sex with other men (MSM).  You get hemodialysis treatment.  You take certain medicines for conditions like cancer, organ transplantation, and autoimmune conditions.  Hepatitis C blood testing is recommended for all people born from 1945 through 1965 and any individual with known risk factors for hepatitis C.  Healthy men should no longer receive prostate-specific antigen (PSA) blood tests as part of routine cancer screening. Talk to your health care provider about prostate cancer screening.  Testicular cancer screening is not recommended for adolescents or adult males who have no symptoms. Screening includes self-exam, a health care provider exam, and other screening tests. Consult with your  health care provider about any symptoms you have or any concerns you have about testicular cancer.  Practice safe sex. Use condoms and avoid high-risk sexual practices to reduce the spread of sexually transmitted infections (STIs).  You should be screened for STIs, including gonorrhea and chlamydia if:  You are sexually active and are younger than 24 years.  You are older than 24 years, and your health care provider tells you that you are at risk for this type of infection.  Your sexual activity has changed since you were last screened, and you are at an increased risk for chlamydia or gonorrhea. Ask your health care provider if you are at risk.  If you are at risk of being infected with HIV, it is recommended that you take a prescription medicine daily to prevent HIV infection. This is called pre-exposure prophylaxis (PrEP). You are considered at risk if:  You are a man who has sex with other men (MSM).  You are a heterosexual man who   is sexually active with multiple partners.  You take drugs by injection.  You are sexually active with a partner who has HIV.  Talk with your health care provider about whether you are at high risk of being infected with HIV. If you choose to begin PrEP, you should first be tested for HIV. You should then be tested every 3 months for as long as you are taking PrEP.  Use sunscreen. Apply sunscreen liberally and repeatedly throughout the day. You should seek shade when your shadow is shorter than you. Protect yourself by wearing long sleeves, pants, a wide-brimmed hat, and sunglasses year round whenever you are outdoors.  Tell your health care provider of new moles or changes in moles, especially if there is a change in shape or color. Also, tell your health care provider if a mole is larger than the size of a pencil eraser.  A one-time screening for abdominal aortic aneurysm (AAA) and surgical repair of large AAAs by ultrasound is recommended for men aged  20-75 years who are current or former smokers.  Stay current with your vaccines (immunizations). Document Released: 06/18/2007 Document Revised: 12/25/2012 Document Reviewed: 05/17/2010 [facility] Patient Information 2015 [place], [place]. This information is not intended to replace advice given to you by your health care provider. Make sure you discuss any questions you have with your health care provider. Smoking Cessation Quitting smoking is important to your health and has many advantages. However, it is not always easy to quit since nicotine is a very addictive drug. Oftentimes, people try 3 times or more before being able to quit. This document explains the best ways for you to prepare to quit smoking. Quitting takes hard work and a lot of effort, but you can do it. ADVANTAGES OF QUITTING SMOKING  You will live longer, feel better, and live better.  Your body will feel the impact of quitting smoking almost immediately.  Within 20 minutes, blood pressure decreases. Your pulse returns to its normal level.  After 8 hours, carbon monoxide levels in the blood return to normal. Your oxygen level increases.  After 24 hours, the chance of having a heart attack starts to decrease. Your breath, hair, and body stop smelling like smoke.  After 48 hours, damaged nerve endings begin to recover. Your sense of taste and smell improve.  After 72 hours, the body is virtually free of nicotine. Your bronchial tubes relax and breathing becomes easier.  After 2 to 12 weeks, lungs can hold more air. Exercise becomes easier and circulation improves.  The risk of having a heart attack, stroke, cancer, or lung disease is greatly reduced.  After 1 year, the risk of coronary heart disease is cut in half.  After 5 years, the risk of stroke falls to the same as a nonsmoker.  After 10 years, the risk of lung cancer is cut in half and the risk of other cancers decreases significantly.  After 15 years, the risk  of coronary heart disease drops, usually to the level of a nonsmoker.  If you are pregnant, quitting smoking will improve your chances of having a healthy baby.  The people you live with, especially any children, will be healthier.  You will have extra money to spend on things other than cigarettes. QUESTIONS TO THINK ABOUT BEFORE ATTEMPTING TO QUIT You may want to talk about your answers with your health care provider.  Why do you want to quit?  If you tried to quit in the past, what helped and  what did not?  What will be the most difficult situations for you after you quit? How will you plan to handle them?  Who can help you through the tough times? Your family? Friends? A health care provider?  What pleasures do you get from smoking? What ways can you still get pleasure if you quit? Here are some questions to ask your health care provider:  How can you help me to be successful at quitting?  What medicine do you think would be best for me and how should I take it?  What should I do if I need more help?  What is smoking withdrawal like? How can I get information on withdrawal? GET READY  Set a quit date.  Change your environment by getting rid of all cigarettes, ashtrays, matches, and lighters in your home, car, or work. Do not let people smoke in your home.  Review your past attempts to quit. Think about what worked and what did not. GET SUPPORT AND ENCOURAGEMENT You have a better chance of being successful if you have help. You can get support in many ways.  Tell your family, friends, and coworkers that you are going to quit and need their support. Ask them not to smoke around you.  Get individual, group, or telephone counseling and support. Programs are available at [employer] and health centers. Call your local health department for information about programs in your area.  Spiritual beliefs and practices may help some smokers quit.  Download a "quit meter" on  your computer to keep track of quit statistics, such as how long you have gone without smoking, cigarettes not smoked, and money saved.  Get a self-help book about quitting smoking and staying off tobacco. Vivian yourself from urges to smoke. Talk to someone, go for a walk, or occupy your time with a task.  Change your normal routine. Take a different route to work. Drink tea instead of coffee. Eat breakfast in a different place.  Reduce your stress. Take a hot bath, exercise, or read a book.  Plan something enjoyable to do every day. Reward yourself for not smoking.  Explore interactive web-based programs that specialize in helping you quit. GET MEDICINE AND USE IT CORRECTLY Medicines can help you stop smoking and decrease the urge to smoke. Combining medicine with the above behavioral methods and support can greatly increase your chances of successfully quitting smoking.  Nicotine replacement therapy helps deliver nicotine to your body without the negative effects and risks of smoking. Nicotine replacement therapy includes nicotine gum, lozenges, inhalers, nasal sprays, and skin patches. Some may be available over-the-counter and others require a prescription.  Antidepressant medicine helps people abstain from smoking, but how this works is unknown. This medicine is available by prescription.  Nicotinic receptor partial agonist medicine simulates the effect of nicotine in your brain. This medicine is available by prescription. Ask your health care provider for advice about which medicines to use and how to use them based on your health history. Your health care provider will tell you what side effects to look out for if you choose to be on a medicine or therapy. Carefully read the information on the package. Do not use any other product containing nicotine while using a nicotine replacement product.  RELAPSE OR DIFFICULT SITUATIONS Most relapses occur  within the first 3 months after quitting. Do not be discouraged if you start smoking again. Remember, most people try several times before finally quitting.  You may have symptoms of withdrawal because your body is used to nicotine. You may crave cigarettes, be irritable, feel very hungry, cough often, get headaches, or have difficulty concentrating. The withdrawal symptoms are only temporary. They are strongest when you first quit, but they will go away within 10-14 days. To reduce the chances of relapse, try to:  Avoid drinking alcohol. Drinking lowers your chances of successfully quitting.  Reduce the amount of caffeine you consume. Once you quit smoking, the amount of caffeine in your body increases and can give you symptoms, such as a rapid heartbeat, sweating, and anxiety.  Avoid smokers because they can make you want to smoke.  Do not let weight gain distract you. Many smokers will gain weight when they quit, usually less than 10 pounds. Eat a healthy diet and stay active. You can always lose the weight gained after you quit.  Find ways to improve your mood other than smoking. FOR MORE INFORMATION  www.smokefree.gov  Document Released: 12/14/2000 Document Revised: 05/06/2013 Document Reviewed: 03/31/2011 [facility] Patient Information 2015 [place], [place]. This information is not intended to replace advice given to you by your health care provider. Make sure you discuss any questions you have with your health care provider.

## 2013-08-06 ENCOUNTER — Other Ambulatory Visit: Payer: Self-pay | Admitting: Family

## 2013-08-06 LAB — LIPID PANEL
CHOL/HDL RATIO: 3 ratio (ref 0.0–5.0)
CHOLESTEROL TOTAL: 141 mg/dL (ref 100–199)
HDL: 47 mg/dL (ref >39)
LDL CALC: 68 mg/dL (ref 0–99)
Triglycerides: 129 mg/dL (ref 0–149)
VLDL Cholesterol Cal: 26 mg/dL (ref 5–40)

## 2013-08-06 LAB — CMP14+EGFR
A/G RATIO: 2 (ref 1.1–2.5)
ALBUMIN: 4.5 g/dL (ref 3.6–4.8)
ALK PHOS: 79 IU/L (ref 39–117)
ALT: 12 IU/L (ref 0–44)
AST: 13 IU/L (ref 0–40)
BILIRUBIN TOTAL: 0.3 mg/dL (ref 0.0–1.2)
BUN / CREAT RATIO: 15 (ref 10–22)
BUN: 13 mg/dL (ref 8–27)
CHLORIDE: 103 mmol/L (ref 97–108)
CO2: 21 mmol/L (ref 18–29)
Calcium: 9.6 mg/dL (ref 8.6–10.2)
Creatinine, Ser: 0.88 mg/dL (ref 0.76–1.27)
GFR, EST AFRICAN AMERICAN: 103 mL/min/{1.73_m2} (ref >59)
GFR, EST NON AFRICAN AMERICAN: 89 mL/min/{1.73_m2} (ref >59)
Globulin, Total: 2.3 g/dL (ref 1.5–4.5)
Glucose: 91 mg/dL (ref 65–99)
Potassium: 4.5 mmol/L (ref 3.5–5.2)
Sodium: 141 mmol/L (ref 134–144)
Total Protein: 6.8 g/dL (ref 6.0–8.5)

## 2013-08-06 LAB — PSA, TOTAL AND FREE
PSA FREE: 0.17 ng/mL
PSA, Free Pct: 24.3 %
PSA: 0.7 ng/mL (ref 0.0–4.0)

## 2013-08-06 LAB — VITAMIN D 25 HYDROXY (VIT D DEFICIENCY, FRACTURES): VIT D 25 HYDROXY: 13.3 ng/mL — AB (ref 30.0–100.0)

## 2013-08-06 MED ORDER — VITAMIN D (ERGOCALCIFEROL) 1.25 MG (50000 UNIT) PO CAPS: 50000.0000 [IU] | ORAL_CAPSULE | ORAL | Status: DC

## 2013-08-06 NOTE — Telephone Encounter (Signed)
Message copied by Waverly Ferrari on Tue Aug 06, 2013 11:23 AM ------      Message from: [place], [place] A      Created: Tue Aug 06, 2013 11:10 AM       Hgb A1C- WNL      Kidney and liver function stable      Cholesterol WNL      Vit D levels low-RX sent to pharmacy      PSA WNL ------

## 2014-01-08 ENCOUNTER — Encounter (HOSPITAL_COMMUNITY): Payer: Self-pay | Admitting: *Deleted

## 2014-01-08 ENCOUNTER — Emergency Department (HOSPITAL_COMMUNITY): Payer: Medicare Other

## 2014-01-08 ENCOUNTER — Emergency Department (HOSPITAL_COMMUNITY)
Admission: EM | Admit: 2014-01-08 | Discharge: 2014-01-08 | Disposition: A | Discharge status: Home / Self Care | Payer: Medicare Other | Attending: Emergency Medicine | Admitting: Emergency Medicine

## 2014-01-08 DIAGNOSIS — Y939 Activity, unspecified: Secondary | ICD-10-CM | POA: Diagnosis not present

## 2014-01-08 DIAGNOSIS — Z79899 Other long term (current) drug therapy: Secondary | ICD-10-CM | POA: Diagnosis not present

## 2014-01-08 DIAGNOSIS — X58XXXA Exposure to other specified factors, initial encounter: Secondary | ICD-10-CM | POA: Insufficient documentation

## 2014-01-08 DIAGNOSIS — Z87448 Personal history of other diseases of urinary system: Secondary | ICD-10-CM | POA: Insufficient documentation

## 2014-01-08 DIAGNOSIS — E119 Type 2 diabetes mellitus without complications: Secondary | ICD-10-CM | POA: Diagnosis not present

## 2014-01-08 DIAGNOSIS — S46901A Unspecified injury of unspecified muscle, fascia and tendon at shoulder and upper arm level, right arm, initial encounter: Secondary | ICD-10-CM | POA: Diagnosis not present

## 2014-01-08 DIAGNOSIS — Z872 Personal history of diseases of the skin and subcutaneous tissue: Secondary | ICD-10-CM | POA: Insufficient documentation

## 2014-01-08 DIAGNOSIS — Z720 Tobacco use: Secondary | ICD-10-CM | POA: Insufficient documentation

## 2014-01-08 DIAGNOSIS — E782 Mixed hyperlipidemia: Secondary | ICD-10-CM | POA: Insufficient documentation

## 2014-01-08 DIAGNOSIS — S4990XA Unspecified injury of shoulder and upper arm, unspecified arm, initial encounter: Secondary | ICD-10-CM | POA: Diagnosis not present

## 2014-01-08 DIAGNOSIS — T1490XA Injury, unspecified, initial encounter: Secondary | ICD-10-CM

## 2014-01-08 DIAGNOSIS — M25511 Pain in right shoulder: Secondary | ICD-10-CM | POA: Diagnosis not present

## 2014-01-08 DIAGNOSIS — Y999 Unspecified external cause status: Secondary | ICD-10-CM | POA: Diagnosis not present

## 2014-01-08 DIAGNOSIS — I10 Essential (primary) hypertension: Secondary | ICD-10-CM | POA: Insufficient documentation

## 2014-01-08 DIAGNOSIS — M7531 Calcific tendinitis of right shoulder: Secondary | ICD-10-CM | POA: Diagnosis not present

## 2014-01-08 DIAGNOSIS — Y929 Unspecified place or not applicable: Secondary | ICD-10-CM | POA: Insufficient documentation

## 2014-01-08 MED ORDER — HYDROCODONE-ACETAMINOPHEN 5-325 MG PO TABS: 1.0000 | ORAL_TABLET | ORAL | Status: DC | PRN

## 2014-01-08 MED ORDER — MELOXICAM 15 MG PO TABS: 15.0000 mg | ORAL_TABLET | Freq: Every day | ORAL | Status: DC

## 2014-01-08 NOTE — Discharge Instructions (Signed)
Your x-ray is consistent with calcific tendinitis of the shoulder. It is suspected that this is where the majority of your pain is coming from. Please ask Dr. Laurance Flatten to refer you to an orthopedic specialist for a consultation concerning this. Please use mobile daily with food. May use Norco for pain if needed. Norco may cause drowsiness, please use this medication with caution. Calcific Tendinitis Calcific tendinitis occurs when crystals of calcium are deposited in a tendon. Tendons are bands of strong, fibrous tissue that attach muscles to bones. Tendons are an important part of joints. They make the joint move and they absorb some of the stress that a joint receives during use. When calcium is deposited in the tendon, the tendon becomes stiff, painful, and it can become swollen. Calcific tendinitis occurs frequently in the shoulder joint, in a structure called the rotator cuff. CAUSES  The cause of calcific tendinitis is unclear. It may be associated with:  Overuse of the tendon, such as from repetitive motion.  Excess stress on the tendon.  Aging.  Repetitive, mild injuries. SYMPTOMS   Pain may or may not be present. If it is present, it may occur when moving the joint.  Tenderness when pressure is applied to the tendon.  A snapping or popping sound when the joint moves.  Decreased motion of the joint.  Difficulty sleeping due to pain in the joint. DIAGNOSIS  Your health care provider will perform a physical exam. Imaging tests may also be used to make the diagnosis. These may include X-rays, an MRI, or a CT scan. TREATMENT  Generally, calcific tendinitis resolves on its own. Treatment for pain of calcific tendinitis may include:  Taking over-the-counter medicines, such as anti-inflammatory drugs.  Applying ice packs to the joint.  Following a specific exercise program to keep the joint working properly.  Attending physical therapy sessions.  Avoiding activities that cause  pain. Treatment for more severe calcific tendinitis may require:  Injecting cortisone steroids or pain relieving medicines into or around the joint.  Manipulating the joint after you are given medicine to numb the area (local anesthetic).  Inflating the joint with sterile fluid to increase the flexibility of the tendons.  Shockwave therapy, which involves focusing sound waves on the joint. If other treatments do not work, surgery may be done to clean out the calcium deposits and repair the tendons where needed. Most people do not need surgery. HOME CARE INSTRUCTIONS   Only take over-the-counter or prescription medicines for pain, fever, or discomfort as directed by your health care provider.  Follow your health care provider's recommendations for activity and exercise. SEEK MEDICAL CARE IF:  You notice an increase in pain or numbness.  You develop new weakness.  You notice increased joint stiffness or a sensation of looseness in the joint.  You notice increasing redness, swelling, or warmth around the joint area. SEEK IMMEDIATE MEDICAL CARE IF:  You have a fever or persistent symptoms for more than 2 to 3 days.  You have a fever and your symptoms suddenly get worse. MAKE SURE YOU:  Understand these instructions.  Will watch your condition.  Will get help right away if you are not doing well or get worse. Document Released: 09/29/2007 Document Revised: 05/06/2013 Document Reviewed: 03/31/2011 [facility] Patient Information 2015 [place], [place]. This information is not intended to replace advice given to you by your health care provider. Make sure you discuss any questions you have with your health care provider.

## 2014-01-08 NOTE — ED Provider Notes (Signed)
CSN: [id number]     Arrival date & time 01/08/14  1246 History   First MD Initiated Contact with Patient 01/08/14 1315     Chief Complaint  Patient presents with  . Shoulder Pain     (Consider location/radiation/quality/duration/timing/severity/associated sxs/prior Treatment) Patient is a 69 y.o. male presenting with shoulder pain. The history is provided by the patient.  Shoulder Pain Location:  Shoulder   Past Medical History  Diagnosis Date  . Cellulitis 03/16/10    resolved in left wrist   . RUQ pain     resolved   . Diabetes mellitus   . Hypertension   . Hyperlipidemia   . Abscess   . Erectile dysfunction   . Tobacco user    Past Surgical History  Procedure Laterality Date  . Replacement total knee    . Hand surgery Right   . Carpal tunnel release Right    No family history on file. History  Substance Use Topics  . Smoking status: Current Every Day Smoker  . Smokeless tobacco: Not on file  . Alcohol Use: No    Review of Systems    Allergies  Review of patient's allergies indicates no known allergies.  Home Medications   Prior to Admission medications   Medication Sig Start Date End Date Taking? Authorizing Provider  atorvastatin (LIPITOR) 10 MG tablet Take 1 tablet (10 mg total) by mouth daily. 08/05/13   Sharion Balloon, FNP  glipiZIDE (GLUCOTROL) 5 MG tablet Take 1 tablet (5 mg total) by mouth daily. 08/05/13   Sharion Balloon, FNP  lisinopril (PRINIVIL,ZESTRIL) 10 MG tablet Take 1 tablet (10 mg total) by mouth daily. 08/05/13   Sharion Balloon, FNP  metFORMIN (GLUCOPHAGE) 500 MG tablet Take 1 tablet (500 mg total) by mouth 2 (two) times daily with a meal. 08/05/13   Sharion Balloon, FNP  Vitamin D, Ergocalciferol, (DRISDOL) 50000 UNITS CAPS capsule Take 1 capsule (50,000 Units total) by mouth every 7 (seven) days. 08/06/13   Christy A Hawks, FNP   BP 123/68 mmHg  Pulse 72  Temp(Src) 98 F (36.7 C) (Oral)  Resp 16  SpO2 99% Physical Exam  Constitutional:  He is oriented to person, place, and time. He appears well-developed and well-nourished.  Non-toxic appearance.  HENT:  Head: Normocephalic.  Right Ear: Tympanic membrane and external ear normal.  Left Ear: Tympanic membrane and external ear normal.  Eyes: EOM and lids are normal. Pupils are equal, round, and reactive to light.  Neck: Normal range of motion. Neck supple. Carotid bruit is not present.  Cardiovascular: Normal rate, regular rhythm, normal heart sounds, intact distal pulses and normal pulses.   Pulmonary/Chest: Breath sounds normal. No respiratory distress.  Abdominal: Soft. Bowel sounds are normal. There is no tenderness. There is no guarding.  Musculoskeletal:       Right shoulder: He exhibits decreased range of motion, tenderness, crepitus and pain. He exhibits no swelling, no effusion and no deformity.  Lymphadenopathy:       Head (right side): No submandibular adenopathy present.       Head (left side): No submandibular adenopathy present.    He has no cervical adenopathy.  Neurological: He is alert and oriented to person, place, and time. He has normal strength. No cranial nerve deficit or sensory deficit.  Skin: Skin is warm and dry.  Psychiatric: He has a normal mood and affect. His speech is normal.  Nursing note and vitals reviewed.   ED Course  Procedures (  including critical care time) Labs Review Labs Reviewed - No data to display  Imaging Review Dg Shoulder Right  01/08/2014   CLINICAL DATA:  Right shoulder pain and decreased range of motion. No known injury.  EXAM: RIGHT SHOULDER - 2+ VIEW  COMPARISON:  02/06/2012  FINDINGS: There is no evidence of fracture or dislocation. There is no evidence of arthropathy or other focal bone abnormality. Mild soft tissue calcification is again seen in the distal rotator cuff tendon, suspicious for calcific tendinitis.  IMPRESSION: No acute findings.  Chronic distal rotator cuff calcification, consistent with calcific  tendinitis.   Electronically Signed   By: Earle Gell M.D.   On: 01/08/2014 14:00     EKG Interpretation None      MDM  X-ray of the right shoulder reveals calcific tendinitis. Vital signs are well within normal limits. Suspect the calcific tendinitis is probably the source of the patient's pain, as there is no neurovascular compromise appreciated.  Sling was offered, but declined at this time. The patient will be treated with Modic and Norco.    Final diagnoses:  Injury    **I have reviewed nursing notes, vital signs, and all appropriate lab and imaging results for this patient.Lenox Ahr, PA-C 01/08/14 [place], MD 01/08/14 [phone]

## 2014-01-08 NOTE — ED Notes (Signed)
Pt states pain to right shoulder x 3 days. States pain is a burning sensation. Also states that last night, "It felt like something was tearing in there last night"

## 2014-01-27 ENCOUNTER — Emergency Department (HOSPITAL_COMMUNITY): Payer: Medicare Other

## 2014-01-27 ENCOUNTER — Encounter (HOSPITAL_COMMUNITY): Payer: Self-pay | Admitting: *Deleted

## 2014-01-27 ENCOUNTER — Emergency Department (HOSPITAL_COMMUNITY)
Admission: EM | Admit: 2014-01-27 | Discharge: 2014-01-27 | Disposition: A | Discharge status: Home / Self Care | Payer: Medicare Other | Attending: Emergency Medicine | Admitting: Emergency Medicine

## 2014-01-27 DIAGNOSIS — E785 Hyperlipidemia, unspecified: Secondary | ICD-10-CM | POA: Diagnosis not present

## 2014-01-27 DIAGNOSIS — Z79899 Other long term (current) drug therapy: Secondary | ICD-10-CM | POA: Insufficient documentation

## 2014-01-27 DIAGNOSIS — Z720 Tobacco use: Secondary | ICD-10-CM | POA: Insufficient documentation

## 2014-01-27 DIAGNOSIS — M545 Low back pain: Secondary | ICD-10-CM

## 2014-01-27 DIAGNOSIS — M5430 Sciatica, unspecified side: Secondary | ICD-10-CM | POA: Diagnosis not present

## 2014-01-27 DIAGNOSIS — Z872 Personal history of diseases of the skin and subcutaneous tissue: Secondary | ICD-10-CM | POA: Insufficient documentation

## 2014-01-27 DIAGNOSIS — E119 Type 2 diabetes mellitus without complications: Secondary | ICD-10-CM | POA: Diagnosis not present

## 2014-01-27 DIAGNOSIS — M5442 Lumbago with sciatica, left side: Secondary | ICD-10-CM | POA: Diagnosis not present

## 2014-01-27 DIAGNOSIS — Z791 Long term (current) use of non-steroidal anti-inflammatories (NSAID): Secondary | ICD-10-CM | POA: Diagnosis not present

## 2014-01-27 DIAGNOSIS — Z87448 Personal history of other diseases of urinary system: Secondary | ICD-10-CM | POA: Diagnosis not present

## 2014-01-27 DIAGNOSIS — I10 Essential (primary) hypertension: Secondary | ICD-10-CM | POA: Insufficient documentation

## 2014-01-27 DIAGNOSIS — M5137 Other intervertebral disc degeneration, lumbosacral region: Secondary | ICD-10-CM | POA: Diagnosis not present

## 2014-01-27 LAB — URINALYSIS, ROUTINE W REFLEX MICROSCOPIC
Bilirubin Urine: NEGATIVE
Glucose, UA: NEGATIVE mg/dL
Hgb urine dipstick: NEGATIVE
Ketones, ur: NEGATIVE mg/dL
Leukocytes, UA: NEGATIVE
Nitrite: NEGATIVE
PH: 5.5 (ref 5.0–8.0)
PROTEIN: NEGATIVE mg/dL
Specific Gravity, Urine: 1.02 (ref 1.005–1.030)
Urobilinogen, UA: 0.2 mg/dL (ref 0.0–1.0)

## 2014-01-27 MED ORDER — HYDROCODONE-ACETAMINOPHEN 5-325 MG PO TABS: 1.0000 | ORAL_TABLET | ORAL | Status: DC | PRN

## 2014-01-27 MED ORDER — HYDROCODONE-ACETAMINOPHEN 5-325 MG PO TABS: 2.0000 | ORAL_TABLET | ORAL | Status: DC | PRN

## 2014-01-27 MED ORDER — OXYCODONE-ACETAMINOPHEN 5-325 MG PO TABS: 1.0000 | ORAL_TABLET | ORAL | Status: DC | PRN

## 2014-01-27 MED ORDER — CYCLOBENZAPRINE HCL 5 MG PO TABS: 5.0000 mg | ORAL_TABLET | Freq: Three times a day (TID) | ORAL | Status: DC | PRN

## 2014-01-27 NOTE — ED Provider Notes (Signed)
CSN: [id number]     Arrival date & time 01/27/14  1832 History   First MD Initiated Contact with Patient 01/27/14 1945     Chief Complaint  Patient presents with  . Back Pain     (Consider location/radiation/quality/duration/timing/severity/associated sxs/prior Treatment) The history is provided by the patient.   Dalton Miller is a 69 y.o. male presenting with a 2 day history of left lower back pain which he woke with yesterday morning.  He describes sleeping in a recliner chair (which he routinely does) and when he woke he had pain, but denies injury or pain prior to sleeping the night before.  His pain is sharp, constant and is worsened when sitting for any period of time, then improves briefly with movement.  He has taken an otc non aspirin arthritis reliever with no significant improvement in pain.  He denies fevers, chills, dysuria, hematuria, incontinence or weakness or numbness in his legs.  He also denies abdominal pain, nausea, vomiting, abdominal distention.  Reports a distant history of kidney stone, but pain is different location and quality.     Past Medical History  Diagnosis Date  . Cellulitis 03/16/10    resolved in left wrist   . RUQ pain     resolved   . Diabetes mellitus   . Hypertension   . Hyperlipidemia   . Abscess   . Erectile dysfunction   . Tobacco user    Past Surgical History  Procedure Laterality Date  . Replacement total knee    . Hand surgery Right   . Carpal tunnel release Right    History reviewed. No pertinent family history. History  Substance Use Topics  . Smoking status: Current Every Day Smoker  . Smokeless tobacco: Not on file  . Alcohol Use: No    Review of Systems  Constitutional: Negative for fever.  Respiratory: Negative for shortness of breath.   Cardiovascular: Negative for chest pain and leg swelling.  Gastrointestinal: Negative for abdominal pain, constipation and abdominal distention.  Genitourinary: Negative for dysuria,  urgency, frequency, flank pain and difficulty urinating.  Musculoskeletal: Positive for back pain. Negative for joint swelling and gait problem.  Skin: Negative for rash.  Neurological: Negative for weakness and numbness.      Allergies  Review of patient's allergies indicates no known allergies.  Home Medications   Prior to Admission medications   Medication Sig Start Date End Date Taking? Authorizing Provider  atorvastatin (LIPITOR) 10 MG tablet Take 1 tablet (10 mg total) by mouth daily. 08/05/13   Sharion Balloon, FNP  cyclobenzaprine (FLEXERIL) 5 MG tablet Take 1 tablet (5 mg total) by mouth 3 (three) times daily as needed for muscle spasms. 01/27/14   Evalee Jefferson, PA-C  glipiZIDE (GLUCOTROL) 5 MG tablet Take 1 tablet (5 mg total) by mouth daily. 08/05/13   Sharion Balloon, FNP  HYDROcodone-acetaminophen (NORCO/VICODIN) 5-325 MG per tablet Take 2 tablets by mouth every 4 (four) hours as needed for moderate pain or severe pain. 01/27/14   Evalee Jefferson, PA-C  lisinopril (PRINIVIL,ZESTRIL) 10 MG tablet Take 1 tablet (10 mg total) by mouth daily. 08/05/13   Sharion Balloon, FNP  meloxicam (MOBIC) 15 MG tablet Take 1 tablet (15 mg total) by mouth daily. 01/08/14   Lenox Ahr, PA-C  metFORMIN (GLUCOPHAGE) 500 MG tablet Take 1 tablet (500 mg total) by mouth 2 (two) times daily with a meal. 08/05/13   Sharion Balloon, FNP  oxyCODONE-acetaminophen (PERCOCET/ROXICET) 5-325 MG per  tablet Take 1 tablet by mouth every 4 (four) hours as needed. 01/27/14   Evalee Jefferson, PA-C  Vitamin D, Ergocalciferol, (DRISDOL) 50000 UNITS CAPS capsule Take 1 capsule (50,000 Units total) by mouth every 7 (seven) days. 08/06/13   Christy A Hawks, FNP   BP 127/65 mmHg  Pulse 82  Temp(Src) 99.2 F (37.3 C) (Oral)  Resp 20  SpO2 99% Physical Exam  Constitutional: He appears well-developed and well-nourished.  HENT:  Head: Normocephalic.  Eyes: Conjunctivae are normal.  Neck: Normal range of motion. Neck supple.   Cardiovascular: Normal rate and intact distal pulses.   Pedal pulses normal.  Pulmonary/Chest: Effort normal.  Abdominal: Soft. Bowel sounds are normal. He exhibits no distension, no pulsatile midline mass and no mass. There is no tenderness.  Musculoskeletal: Normal range of motion. He exhibits no edema.       Lumbar back: He exhibits tenderness. He exhibits no swelling, no edema and no spasm.  ttp left lower paralumbar and left posterior pelvic rim.  No SI joint tenderness.  Neurological: He is alert. He has normal strength. He displays no atrophy and no tremor. No sensory deficit. Gait normal.  Reflex Scores:      Patellar reflexes are 2+ on the right side and 2+ on the left side.      Achilles reflexes are 2+ on the right side and 2+ on the left side. No strength deficit noted in hip and knee flexor and extensor muscle groups.  Ankle flexion and extension intact.  Skin: Skin is warm and dry. No rash noted.  Psychiatric: He has a normal mood and affect.  Nursing note and vitals reviewed.   ED Course  Procedures (including critical care time) Labs Review Labs Reviewed  URINALYSIS, ROUTINE W REFLEX MICROSCOPIC    Imaging Review Dg Lumbar Spine Complete  01/27/2014   CLINICAL DATA:  69 year old male with a history of low back pain. No history of injury.  EXAM: LUMBAR SPINE - COMPLETE 4+ VIEW  COMPARISON:  None.  FINDINGS: Vertebral elements maintain relatively normal anatomic alignment without evidence of subluxation. Trace retrolisthesis of L3 on L4.  No acute fracture line identified. Vertebral body heights relatively maintained.  Disc space heights narrowed at T11-T12 and L5-S1 compatible with early degenerative disc disease.  Congenital pedicle narrowing, contributing to an element of congenital canal narrowing. These changes as well as facet disease and degenerative disc disease likely contribute to mild neural foraminal narrowing of the L3-S1 levels.  Oblique images demonstrate no  displaced pars defects.  Dense atherosclerotic calcifications.  IMPRESSION: No acute fracture or malalignment of the lumbar spine.  Early facet disease and degenerative disc disease as well as an element of congenital spinal canal narrowing contributes to early foraminal narrowing at the levels of L3 through S1.  Atherosclerosis.  Signed,  Dulcy Fanny. Earleen Newport, DO  [facility]  [facility]   Electronically Signed   By: Corrie Mckusick D.O.   On: 01/27/2014 21:13     EKG Interpretation None      MDM   Final diagnoses:  Left low back pain, with sciatica presence unspecified    Patients labs and/or radiological studies were viewed and considered during the medical decision making and disposition process. Pt was also seen by Dr Roderic Palau during this visit.  Pt with reproducible left lower back pain c/w low back strain.  No neuro deficit on exam or by history to suggest emergent or surgical presentation.  Also discussed worsened  sx that should prompt immediate re-evaluation including distal weakness, bowel/bladder retention/incontinence.  He was prescribed oxycodone and flexeril, advised heat tx. F/u with pcp if not improving over the week.          Evalee Jefferson, PA-C 01/28/14 [phone]

## 2014-01-27 NOTE — Discharge Instructions (Signed)
Back Pain, Adult Low back pain is very common. About 1 in 5 people have back pain.The cause of low back pain is rarely dangerous. The pain often gets better over time.About half of people with a sudden onset of back pain feel better in just 2 weeks. About 8 in 10 people feel better by 6 weeks.  CAUSES Some common causes of back pain include:  Strain of the muscles or ligaments supporting the spine.  Wear and tear (degeneration) of the spinal discs.  Arthritis.  Direct injury to the back. DIAGNOSIS Most of the time, the direct cause of low back pain is not known.However, back pain can be treated effectively even when the exact cause of the pain is unknown.Answering your caregiver's questions about your overall health and symptoms is one of the most accurate ways to make sure the cause of your pain is not dangerous. If your caregiver needs more information, he or she may order lab work or imaging tests (X-rays or MRIs).However, even if imaging tests show changes in your back, this usually does not require surgery. HOME CARE INSTRUCTIONS For many people, back pain returns.Since low back pain is rarely dangerous, it is often a condition that people can learn to [facility] their own.   Remain active. It is stressful on the back to sit or stand in one place. Do not sit, drive, or stand in one place for more than 30 minutes at a time. Take short walks on level surfaces as soon as pain allows.Try to increase the length of time you walk each day.  Do not stay in bed.Resting more than 1 or 2 days can delay your recovery.  Do not avoid exercise or work.Your body is made to move.It is not dangerous to be active, even though your back may hurt.Your back will likely heal faster if you return to being active before your pain is gone.  Pay attention to your body when you bend and lift. Many people have less discomfortwhen lifting if they bend their knees, keep the load close to their bodies,and  avoid twisting. Often, the most comfortable positions are those that put less stress on your recovering back.  Find a comfortable position to sleep. Use a firm mattress and lie on your side with your knees slightly bent. If you lie on your back, put a pillow under your knees.  Only take over-the-counter or prescription medicines as directed by your caregiver. Over-the-counter medicines to reduce pain and inflammation are often the most helpful.Your caregiver may prescribe muscle relaxant drugs.These medicines help dull your pain so you can more quickly return to your normal activities and healthy exercise.  Put ice on the injured area.  Put ice in a plastic bag.  Place a towel between your skin and the bag.  Leave the ice on for 15-20 minutes, 03-04 times a day for the first 2 to 3 days. After that, ice and heat may be alternated to reduce pain and spasms.  Ask your caregiver about trying back exercises and gentle massage. This may be of some benefit.  Avoid feeling anxious or stressed.Stress increases muscle tension and can worsen back pain.It is important to recognize when you are anxious or stressed and learn ways to manage it.Exercise is a great option. SEEK MEDICAL CARE IF:  You have pain that is not relieved with rest or medicine.  You have pain that does not improve in 1 week.  You have new symptoms.  You are generally not feeling well. SEEK  IMMEDIATE MEDICAL CARE IF:   You have pain that radiates from your back into your legs.  You develop new bowel or bladder control problems.  You have unusual weakness or numbness in your arms or legs.  You develop nausea or vomiting.  You develop abdominal pain.  You feel faint. Document Released: 12/20/2004 Document Revised: 06/21/2011 Document Reviewed: 04/23/2013 [facility] Patient Information 2015 [place], [place]. This information is not intended to replace advice given to you by your health care provider. Make sure you  discuss any questions you have with your health care provider.   Do not drive within 4 hours of taking hydrocodone or flexeril as these will make you drowsy.  Avoid lifting,  Bending,  Twisting or any other activity that worsens your pain over the next week.   You add heat to your lower back for 20 minutes several times daily.  You should get rechecked if your symptoms are not better over the next 5 days,  Or you develop increased pain,  Weakness in your leg(s) or loss of bladder or bowel function - these can be signs of a worsening condition.

## 2014-01-27 NOTE — ED Notes (Signed)
Low back pain for 2 days. No injury known

## 2014-01-27 NOTE — ED Notes (Signed)
Pt verbalized understanding of no driving and to use caution within 4 hours of taking pain meds due to meds cause drowsiness 

## 2014-02-05 MED FILL — Hydrocodone-Acetaminophen Tab 5-325 MG: ORAL | Qty: 6 | Status: AC

## 2014-02-07 ENCOUNTER — Ambulatory Visit: Payer: Medicare Other | Admitting: Family

## 2014-02-14 ENCOUNTER — Encounter: Payer: Self-pay | Admitting: Family

## 2014-02-14 ENCOUNTER — Ambulatory Visit (INDEPENDENT_AMBULATORY_CARE_PROVIDER_SITE_OTHER): Payer: Medicare Other | Admitting: Family

## 2014-02-14 VITALS — BP 134/78 | HR 58 | Temp 98.0°F | Ht 67.0 in | Wt 188.2 lb

## 2014-02-14 DIAGNOSIS — E785 Hyperlipidemia, unspecified: Secondary | ICD-10-CM

## 2014-02-14 DIAGNOSIS — I10 Essential (primary) hypertension: Secondary | ICD-10-CM

## 2014-02-14 DIAGNOSIS — E1165 Type 2 diabetes mellitus with hyperglycemia: Secondary | ICD-10-CM

## 2014-02-14 DIAGNOSIS — E119 Type 2 diabetes mellitus without complications: Secondary | ICD-10-CM | POA: Diagnosis not present

## 2014-02-14 DIAGNOSIS — I152 Hypertension secondary to endocrine disorders: Secondary | ICD-10-CM | POA: Insufficient documentation

## 2014-02-14 DIAGNOSIS — E1159 Type 2 diabetes mellitus with other circulatory complications: Secondary | ICD-10-CM | POA: Insufficient documentation

## 2014-02-14 DIAGNOSIS — Z1321 Encounter for screening for nutritional disorder: Secondary | ICD-10-CM

## 2014-02-14 LAB — POCT GLYCOSYLATED HEMOGLOBIN (HGB A1C): Hemoglobin A1C: 6.4

## 2014-02-14 NOTE — Patient Instructions (Signed)

## 2014-02-14 NOTE — Addendum Note (Signed)
Addended by: Earlene Plater on: 02/14/2014 03:53 PM   Modules accepted: Orders

## 2014-02-14 NOTE — Progress Notes (Signed)
Subjective:    Patient ID: Dalton Miller, male    DOB: 1945/10/02, 69 y.o.   MRN: [id number]  Diabetes He presents for his follow-up diabetic visit. He has type 2 diabetes mellitus. His disease course has been fluctuating. There are no hypoglycemic associated symptoms. Pertinent negatives for hypoglycemia include no confusion, dizziness, headaches or mood changes. Pertinent negatives for diabetes include no foot paresthesias, no foot ulcerations and no visual change. There are no hypoglycemic complications. Symptoms are stable. Pertinent negatives for diabetic complications include no CVA, heart disease, nephropathy or peripheral neuropathy. Risk factors for coronary artery disease include dyslipidemia, male sex and tobacco exposure. Current diabetic treatment includes oral agent (dual therapy). He is compliant with treatment all of the time. He is following a generally healthy diet. (Pt states he has not taken his BS over the last month ) An ACE inhibitor/angiotensin II receptor blocker is being taken. Eye exam is current.  Hyperlipidemia This is a chronic problem. The current episode started more than 1 year ago. The problem is controlled. Recent lipid tests were reviewed and are normal. Exacerbating diseases include diabetes. He has no history of hypothyroidism. Factors aggravating his hyperlipidemia include smoking and fatty foods. Pertinent negatives include no leg pain, myalgias or shortness of breath. Current antihyperlipidemic treatment includes statins. The current treatment provides moderate improvement of lipids. Risk factors for coronary artery disease include dyslipidemia, diabetes mellitus, family history, male sex and a sedentary lifestyle.  Hypertension This is a chronic problem. The current episode started more than 1 year ago. The problem has been resolved since onset. The problem is controlled. Pertinent negatives include no anxiety, headaches, palpitations, peripheral edema or shortness  of breath. Risk factors for coronary artery disease include dyslipidemia, diabetes mellitus, male gender, family history and smoking/tobacco exposure. Past treatments include ACE inhibitors. The current treatment provides significant improvement. There is no history of kidney disease, CAD/MI, CVA, heart failure or a thyroid problem. There is no history of sleep apnea.      Review of Systems  Constitutional: Negative.   HENT: Negative.   Respiratory: Negative.  Negative for shortness of breath.   Cardiovascular: Negative.  Negative for palpitations.  Gastrointestinal: Negative.   Endocrine: Negative.   Genitourinary: Negative.   Musculoskeletal: Negative.  Negative for myalgias.  Neurological: Negative.  Negative for dizziness and headaches.  Hematological: Negative.   Psychiatric/Behavioral: Negative.  Negative for confusion.  All other systems reviewed and are negative.      Objective:   Physical Exam  Constitutional: He is oriented to person, place, and time. He appears well-developed and well-nourished. No distress.  HENT:  Head: Normocephalic.  Right Ear: External ear normal.  Left Ear: External ear normal.  Mouth/Throat: Oropharynx is clear and moist.  Eyes: Pupils are equal, round, and reactive to light. Right eye exhibits no discharge. Left eye exhibits no discharge.  Neck: Normal range of motion. Neck supple. No thyromegaly present.  Cardiovascular: Normal rate, regular rhythm, normal heart sounds and intact distal pulses.   No murmur heard. Pulmonary/Chest: Effort normal. No respiratory distress. He has wheezes.  Coarse breath sounds bilaterally   Abdominal: Soft. Bowel sounds are normal. He exhibits no distension. There is no tenderness.  Musculoskeletal: Normal range of motion. He exhibits no edema or tenderness.  Neurological: He is alert and oriented to person, place, and time. He has normal reflexes. No cranial nerve deficit.  Skin: Skin is warm and dry. No rash  noted. No erythema.  Psychiatric: He has  a normal mood and affect. His behavior is normal. Judgment and thought content normal.  Vitals reviewed.    BP 134/78 mmHg  Pulse 58  Temp(Src) 98 F (36.7 C) (Oral)  Ht '5\' 7"'  (1.702 m)  Wt 188 lb 3.2 oz (85.367 kg)  BMI 29.47 kg/m2  *See Diabetic foot note    Assessment & Plan:  1. Type 2 diabetes mellitus with hyperglycemia - POCT glycosylated hemoglobin (Hb A1C) - POCT UA - Microalbumin - CMP14+EGFR  2. Hyperlipidemia - CMP14+EGFR - Lipid panel  3. Essential hypertension - CMP14+EGFR  4. Encounter for vitamin deficiency screening - Vit D  25 hydroxy (rtn osteoporosis monitoring)   Continue all meds Labs pending Health Maintenance reviewed Diet and exercise encouraged RTO 6 months  Evelina Dun, FNP

## 2014-02-15 LAB — CMP14+EGFR
ALT: 12 IU/L (ref 0–44)
AST: 17 IU/L (ref 0–40)
Albumin/Globulin Ratio: 1.9 (ref 1.1–2.5)
Albumin: 4.3 g/dL (ref 3.6–4.8)
Alkaline Phosphatase: 70 IU/L (ref 39–117)
BUN/Creatinine Ratio: 11 (ref 10–22)
BUN: 9 mg/dL (ref 8–27)
Bilirubin Total: 0.3 mg/dL (ref 0.0–1.2)
CHLORIDE: 103 mmol/L (ref 97–108)
CO2: 23 mmol/L (ref 18–29)
Calcium: 9.4 mg/dL (ref 8.6–10.2)
Creatinine, Ser: 0.8 mg/dL (ref 0.76–1.27)
GFR calc Af Amer: 106 mL/min/{1.73_m2} (ref >59)
GFR calc non Af Amer: 92 mL/min/{1.73_m2} (ref >59)
GLOBULIN, TOTAL: 2.3 g/dL (ref 1.5–4.5)
GLUCOSE: 75 mg/dL (ref 65–99)
POTASSIUM: 4.5 mmol/L (ref 3.5–5.2)
Sodium: 140 mmol/L (ref 134–144)
Total Protein: 6.6 g/dL (ref 6.0–8.5)

## 2014-02-15 LAB — LIPID PANEL
CHOLESTEROL TOTAL: 142 mg/dL (ref 100–199)
Chol/HDL Ratio: 3 ratio units (ref 0.0–5.0)
HDL: 47 mg/dL (ref >39)
LDL Calculated: 72 mg/dL (ref 0–99)
Triglycerides: 113 mg/dL (ref 0–149)
VLDL Cholesterol Cal: 23 mg/dL (ref 5–40)

## 2014-02-15 LAB — VITAMIN D 25 HYDROXY (VIT D DEFICIENCY, FRACTURES): Vit D, 25-Hydroxy: 15.3 ng/mL — ABNORMAL LOW (ref 30.0–100.0)

## 2014-02-16 ENCOUNTER — Other Ambulatory Visit: Payer: Self-pay | Admitting: Family

## 2014-02-16 DIAGNOSIS — E559 Vitamin D deficiency, unspecified: Secondary | ICD-10-CM

## 2014-02-16 MED ORDER — VITAMIN D (ERGOCALCIFEROL) 1.25 MG (50000 UNIT) PO CAPS: 50000.0000 [IU] | ORAL_CAPSULE | ORAL | Status: DC

## 2014-05-26 ENCOUNTER — Ambulatory Visit (INDEPENDENT_AMBULATORY_CARE_PROVIDER_SITE_OTHER): Payer: Medicare Other | Admitting: Family

## 2014-05-26 ENCOUNTER — Encounter: Payer: Self-pay | Admitting: Family

## 2014-05-26 VITALS — BP 143/79 | HR 62 | Temp 98.5°F | Ht 67.0 in | Wt 187.6 lb

## 2014-05-26 DIAGNOSIS — Z418 Encounter for other procedures for purposes other than remedying health state: Secondary | ICD-10-CM

## 2014-05-26 DIAGNOSIS — E785 Hyperlipidemia, unspecified: Secondary | ICD-10-CM

## 2014-05-26 DIAGNOSIS — E119 Type 2 diabetes mellitus without complications: Secondary | ICD-10-CM

## 2014-05-26 DIAGNOSIS — E1165 Type 2 diabetes mellitus with hyperglycemia: Secondary | ICD-10-CM

## 2014-05-26 DIAGNOSIS — E559 Vitamin D deficiency, unspecified: Secondary | ICD-10-CM

## 2014-05-26 DIAGNOSIS — I10 Essential (primary) hypertension: Secondary | ICD-10-CM | POA: Diagnosis not present

## 2014-05-26 DIAGNOSIS — Z23 Encounter for immunization: Secondary | ICD-10-CM

## 2014-05-26 DIAGNOSIS — Z299 Encounter for prophylactic measures, unspecified: Secondary | ICD-10-CM

## 2014-05-26 LAB — POCT GLYCOSYLATED HEMOGLOBIN (HGB A1C): HEMOGLOBIN A1C: 6.1

## 2014-05-26 MED ORDER — METFORMIN HCL 500 MG PO TABS: 500.0000 mg | ORAL_TABLET | Freq: Two times a day (BID) | ORAL | Status: DC

## 2014-05-26 MED ORDER — LISINOPRIL 10 MG PO TABS: 10.0000 mg | ORAL_TABLET | Freq: Every day | ORAL | Status: DC

## 2014-05-26 MED ORDER — GLIPIZIDE 5 MG PO TABS: 5.0000 mg | ORAL_TABLET | Freq: Every day | ORAL | Status: DC

## 2014-05-26 MED ORDER — CYCLOBENZAPRINE HCL 10 MG PO TABS: 10.0000 mg | ORAL_TABLET | Freq: Three times a day (TID) | ORAL | Status: DC | PRN

## 2014-05-26 MED ORDER — ATORVASTATIN CALCIUM 10 MG PO TABS: 10.0000 mg | ORAL_TABLET | Freq: Every day | ORAL | Status: DC

## 2014-05-26 NOTE — Progress Notes (Signed)
Subjective:    Patient ID: Dalton Miller, male    DOB: April 16, 1945, 69 y.o.   MRN: [id number]  Diabetes He presents for his follow-up diabetic visit. He has type 2 diabetes mellitus. His disease course has been fluctuating. There are no hypoglycemic associated symptoms. Pertinent negatives for hypoglycemia include no confusion, dizziness, headaches or mood changes. Associated symptoms include foot paresthesias (At times). Pertinent negatives for diabetes include no foot ulcerations and no visual change. There are no hypoglycemic complications. Symptoms are worsening. Pertinent negatives for diabetic complications include no CVA, heart disease, nephropathy or peripheral neuropathy. Risk factors for coronary artery disease include dyslipidemia, male sex and tobacco exposure. Current diabetic treatment includes oral agent (dual therapy). He is compliant with treatment all of the time. He is following a generally healthy diet. His breakfast blood glucose range is generally 110-130 mg/dl. ( ) An ACE inhibitor/angiotensin II receptor blocker is being taken. Eye exam is not current.  Hypertension This is a chronic problem. The current episode started more than 1 year ago. The problem has been waxing and waning since onset. The problem is controlled. Pertinent negatives include no anxiety, headaches, palpitations, peripheral edema or shortness of breath. Risk factors for coronary artery disease include dyslipidemia, diabetes mellitus, male gender, family history and smoking/tobacco exposure. Past treatments include ACE inhibitors. The current treatment provides significant improvement. There is no history of kidney disease, CAD/MI, CVA, heart failure or a thyroid problem. There is no history of sleep apnea.  Hyperlipidemia This is a chronic problem. The current episode started more than 1 year ago. The problem is controlled. Recent lipid tests were reviewed and are normal. Exacerbating diseases include diabetes. He  has no history of hypothyroidism. Factors aggravating his hyperlipidemia include smoking and fatty foods. Pertinent negatives include no leg pain, myalgias or shortness of breath. Current antihyperlipidemic treatment includes statins. The current treatment provides moderate improvement of lipids. Risk factors for coronary artery disease include dyslipidemia, diabetes mellitus, family history, male sex and a sedentary lifestyle.  Shoulder Pain  The pain is present in the right shoulder. This is a chronic problem. The current episode started more than 1 year ago. There has been no history of extremity trauma. The problem occurs intermittently. The problem has been waxing and waning. The quality of the pain is described as aching. The pain is at a severity of 5/10. The pain is mild. Pertinent negatives include no inability to bear weight or itching. He has tried rest and acetaminophen for the symptoms. The treatment provided mild relief. His past medical history is significant for diabetes.      Review of Systems  Constitutional: Negative.   HENT: Negative.   Respiratory: Negative.  Negative for shortness of breath.   Cardiovascular: Negative.  Negative for palpitations.  Gastrointestinal: Negative.   Endocrine: Negative.   Genitourinary: Negative.   Musculoskeletal: Negative.  Negative for myalgias.  Skin: Negative for itching.  Neurological: Negative.  Negative for dizziness and headaches.  Hematological: Negative.   Psychiatric/Behavioral: Negative.  Negative for confusion.  All other systems reviewed and are negative.      Objective:   Physical Exam  Constitutional: He is oriented to person, place, and time. He appears well-developed and well-nourished. No distress.  HENT:  Head: Normocephalic.  Right Ear: External ear normal.  Left Ear: External ear normal.  Nose: Nose normal.  Mouth/Throat: Oropharynx is clear and moist.  Eyes: Pupils are equal, round, and reactive to light. Right  eye exhibits no discharge.  Left eye exhibits no discharge.  Neck: Normal range of motion. Neck supple. No thyromegaly present.  Cardiovascular: Normal rate, regular rhythm, normal heart sounds and intact distal pulses.   No murmur heard. Pulmonary/Chest: Effort normal and breath sounds normal. No respiratory distress. He has no wheezes.  Abdominal: Soft. Bowel sounds are normal. He exhibits no distension. There is no tenderness.  Musculoskeletal: Normal range of motion. He exhibits no edema or tenderness.  Neurological: He is alert and oriented to person, place, and time. He has normal reflexes. No cranial nerve deficit.  Skin: Skin is warm and dry. No rash noted. No erythema.  Psychiatric: He has a normal mood and affect. His behavior is normal. Judgment and thought content normal.  Vitals reviewed.     BP 150/87 mmHg  Pulse 62  Temp(Src) 98.5 F (36.9 C) (Oral)  Ht _0  (1.702 m)  Wt 187 lb 9.6 oz (85.095 kg)  BMI 29.38 kg/m2     Assessment & Plan:  1. Essential hypertension -Pt states he does not want to change his medications at this time- We will recheck in 3 months and if still elevated will change medicaiton - CMP14+EGFR - lisinopril (PRINIVIL,ZESTRIL) 10 MG tablet; Take 1 tablet (10 mg total) by mouth daily.  Dispense: 90 tablet; Refill: 3  2. Type 2 diabetes mellitus with hyperglycemia - POCT glycosylated hemoglobin (Hb A1C) - CMP14+EGFR  3. Hyperlipidemia - CMP14+EGFR - Lipid panel - atorvastatin (LIPITOR) 10 MG tablet; Take 1 tablet (10 mg total) by mouth daily.  Dispense: 90 tablet; Refill: 3  4. Vitamin D deficiency - CMP14+EGFR - Vit D  25 hydroxy (rtn osteoporosis monitoring)  5. Type 2 diabetes mellitus without complication - AJO87+OMVE - metFORMIN (GLUCOPHAGE) 500 MG tablet; Take 1 tablet (500 mg total) by mouth 2 (two) times daily with a meal.  Dispense: 180 tablet; Refill: 3 - glipiZIDE (GLUCOTROL) 5 MG tablet; Take 1 tablet (5 mg total) by mouth  daily.  Dispense: 90 tablet; Refill: 3   Continue all meds Labs pending Health Maintenance reviewed Diet and exercise encouraged RTO 3 months  Evelina Dun, FNP

## 2014-05-26 NOTE — Patient Instructions (Signed)

## 2014-05-27 LAB — CMP14+EGFR
ALBUMIN: 4.2 g/dL (ref 3.6–4.8)
ALT: 8 IU/L (ref 0–44)
AST: 10 IU/L (ref 0–40)
Albumin/Globulin Ratio: 1.7 (ref 1.1–2.5)
Alkaline Phosphatase: 83 IU/L (ref 39–117)
BUN/Creatinine Ratio: 15 (ref 10–22)
BUN: 14 mg/dL (ref 8–27)
Bilirubin Total: 0.3 mg/dL (ref 0.0–1.2)
CHLORIDE: 102 mmol/L (ref 97–108)
CO2: 20 mmol/L (ref 18–29)
Calcium: 9.4 mg/dL (ref 8.6–10.2)
Creatinine, Ser: 0.92 mg/dL (ref 0.76–1.27)
GFR calc Af Amer: 98 mL/min/{1.73_m2} (ref >59)
GFR, EST NON AFRICAN AMERICAN: 85 mL/min/{1.73_m2} (ref >59)
GLOBULIN, TOTAL: 2.5 g/dL (ref 1.5–4.5)
Glucose: 115 mg/dL — ABNORMAL HIGH (ref 65–99)
Potassium: 5.2 mmol/L (ref 3.5–5.2)
SODIUM: 140 mmol/L (ref 134–144)
TOTAL PROTEIN: 6.7 g/dL (ref 6.0–8.5)

## 2014-05-27 LAB — LIPID PANEL
Chol/HDL Ratio: 2.9 ratio units (ref 0.0–5.0)
Cholesterol, Total: 136 mg/dL (ref 100–199)
HDL: 47 mg/dL (ref >39)
LDL Calculated: 76 mg/dL (ref 0–99)
Triglycerides: 63 mg/dL (ref 0–149)
VLDL CHOLESTEROL CAL: 13 mg/dL (ref 5–40)

## 2014-05-27 LAB — VITAMIN D 25 HYDROXY (VIT D DEFICIENCY, FRACTURES): Vit D, 25-Hydroxy: 33.9 ng/mL (ref 30.0–100.0)

## 2014-05-27 NOTE — Progress Notes (Signed)
Patient aware.

## 2014-05-28 ENCOUNTER — Telehealth: Payer: Self-pay | Admitting: Family

## 2014-05-28 MED ORDER — TRAMADOL HCL 50 MG PO TABS
50.0000 mg | ORAL_TABLET | Freq: Three times a day (TID) | ORAL | Status: DC | PRN
Start: 2014-05-28 — End: 2014-10-21

## 2014-05-28 NOTE — Telephone Encounter (Signed)
Ultram rx ready for pick up  

## 2014-05-29 NOTE — Telephone Encounter (Signed)
Aware, script for tramadol ready.

## 2014-06-12 DIAGNOSIS — H2513 Age-related nuclear cataract, bilateral: Secondary | ICD-10-CM | POA: Diagnosis not present

## 2014-06-12 DIAGNOSIS — E119 Type 2 diabetes mellitus without complications: Secondary | ICD-10-CM | POA: Diagnosis not present

## 2014-06-12 LAB — HM DIABETES EYE EXAM

## 2014-07-15 ENCOUNTER — Encounter: Payer: Self-pay | Admitting: Gastroenterology

## 2014-10-21 ENCOUNTER — Other Ambulatory Visit: Payer: Self-pay | Admitting: *Deleted

## 2014-10-21 ENCOUNTER — Telehealth: Payer: Self-pay | Admitting: Family

## 2014-10-21 ENCOUNTER — Ambulatory Visit (INDEPENDENT_AMBULATORY_CARE_PROVIDER_SITE_OTHER): Payer: Medicare Other | Admitting: Family

## 2014-10-21 ENCOUNTER — Encounter: Payer: Self-pay | Admitting: Family

## 2014-10-21 VITALS — BP 136/78 | HR 56 | Temp 97.2°F | Ht 67.0 in | Wt 187.0 lb

## 2014-10-21 DIAGNOSIS — I10 Essential (primary) hypertension: Secondary | ICD-10-CM

## 2014-10-21 DIAGNOSIS — E1165 Type 2 diabetes mellitus with hyperglycemia: Secondary | ICD-10-CM

## 2014-10-21 DIAGNOSIS — E559 Vitamin D deficiency, unspecified: Secondary | ICD-10-CM | POA: Diagnosis not present

## 2014-10-21 DIAGNOSIS — Z1159 Encounter for screening for other viral diseases: Secondary | ICD-10-CM | POA: Diagnosis not present

## 2014-10-21 DIAGNOSIS — E785 Hyperlipidemia, unspecified: Secondary | ICD-10-CM | POA: Diagnosis not present

## 2014-10-21 DIAGNOSIS — Z720 Tobacco use: Secondary | ICD-10-CM | POA: Diagnosis not present

## 2014-10-21 LAB — POCT GLYCOSYLATED HEMOGLOBIN (HGB A1C): HEMOGLOBIN A1C: 6.4

## 2014-10-21 LAB — POCT UA - MICROALBUMIN: Microalbumin Ur, POC: NEGATIVE mg/L

## 2014-10-21 MED ORDER — ASPIRIN EC 81 MG PO TBEC: 81.0000 mg | DELAYED_RELEASE_TABLET | Freq: Every day | ORAL | Status: DC

## 2014-10-21 MED ORDER — GLUCOSE BLOOD VI STRP: ORAL_STRIP | Status: DC

## 2014-10-21 NOTE — Patient Instructions (Signed)

## 2014-10-21 NOTE — Addendum Note (Signed)
Addended by: Earlene Plater on: 10/21/2014 11:25 AM   Modules accepted: Miquel Dunn

## 2014-10-21 NOTE — Progress Notes (Signed)
Subjective:    Patient ID: Dalton Miller, male    DOB: 1945-07-22, 69 y.o.   MRN: [id number]  Pt presents to the office today for chronic follow up.  Diabetes He presents for his follow-up diabetic visit. He has type 2 diabetes mellitus. His disease course has been fluctuating. There are no hypoglycemic associated symptoms. Pertinent negatives for hypoglycemia include no confusion, dizziness, headaches or mood changes. Associated symptoms include foot paresthesias (At times). Pertinent negatives for diabetes include no foot ulcerations and no visual change. There are no hypoglycemic complications. Symptoms are worsening. Pertinent negatives for diabetic complications include no CVA, heart disease, nephropathy or peripheral neuropathy. Risk factors for coronary artery disease include dyslipidemia, male sex and tobacco exposure. Current diabetic treatment includes oral agent (dual therapy). He is compliant with treatment all of the time. He is following a generally healthy diet. His breakfast blood glucose range is generally 110-130 mg/dl. ( ) An ACE inhibitor/angiotensin II receptor blocker is being taken. Eye exam is current.  Hypertension This is a chronic problem. The current episode started more than 1 year ago. The problem has been resolved since onset. The problem is controlled. Pertinent negatives include no anxiety, headaches, palpitations, peripheral edema or shortness of breath. Risk factors for coronary artery disease include dyslipidemia, diabetes mellitus, male gender, family history and smoking/tobacco exposure. Past treatments include ACE inhibitors. The current treatment provides significant improvement. There is no history of kidney disease, CAD/MI, CVA, heart failure or a thyroid problem. There is no history of sleep apnea.  Hyperlipidemia This is a chronic problem. The current episode started more than 1 year ago. The problem is controlled. Recent lipid tests were reviewed and are  normal. He has no history of hypothyroidism. Factors aggravating his hyperlipidemia include smoking and fatty foods. Pertinent negatives include no leg pain, myalgias or shortness of breath. Current antihyperlipidemic treatment includes statins. The current treatment provides moderate improvement of lipids. Risk factors for coronary artery disease include dyslipidemia, diabetes mellitus, family history, male sex and a sedentary lifestyle.      Review of Systems  Constitutional: Negative.   HENT: Negative.   Respiratory: Negative.  Negative for shortness of breath.   Cardiovascular: Negative.  Negative for palpitations.  Gastrointestinal: Negative.   Endocrine: Negative.   Genitourinary: Negative.   Musculoskeletal: Negative.  Negative for myalgias.  Neurological: Negative.  Negative for dizziness and headaches.  Hematological: Negative.   Psychiatric/Behavioral: Negative.  Negative for confusion.  All other systems reviewed and are negative.      Objective:   Physical Exam  Constitutional: He is oriented to person, place, and time. He appears well-developed and well-nourished. No distress.  HENT:  Head: Normocephalic.  Right Ear: External ear normal.  Left Ear: External ear normal.  Nose: Nose normal.  Mouth/Throat: Oropharynx is clear and moist.  Eyes: Pupils are equal, round, and reactive to light. Right eye exhibits no discharge. Left eye exhibits no discharge.  Neck: Normal range of motion. Neck supple. No thyromegaly present.  Cardiovascular: Normal rate, regular rhythm, normal heart sounds and intact distal pulses.   No murmur heard. Pulmonary/Chest: Effort normal and breath sounds normal. No respiratory distress. He has no wheezes.  Abdominal: Soft. Bowel sounds are normal. He exhibits no distension. There is no tenderness.  Musculoskeletal: Normal range of motion. He exhibits no edema or tenderness.  Neurological: He is alert and oriented to person, place, and time. He  has normal reflexes. No cranial nerve deficit.  Skin: Skin is  warm and dry. No rash noted. No erythema.  Psychiatric: He has a normal mood and affect. His behavior is normal. Judgment and thought content normal.  Vitals reviewed.     BP 136/78 mmHg  Pulse 56  Temp(Src) 97.2 F (36.2 C) (Oral)  Ht _0  (1.702 m)  Wt 187 lb (84.823 kg)  BMI 29.28 kg/m2     Assessment & Plan:  1. Essential hypertension - CMP14+EGFR  2. Type 2 diabetes mellitus with hyperglycemia, without long-term current use of insulin (HCC) - POCT glycosylated hemoglobin (Hb A1C) - CMP14+EGFR - POCT UA - Microalbumin  3. Tobacco user - CMP14+EGFR  4. Hyperlipidemia - CMP14+EGFR - Lipid panel  5. Vitamin D deficiency - CMP14+EGFR - Vit D  25 hydroxy (rtn osteoporosis monitoring)  6. Need for hepatitis C screening test - CMP14+EGFR - Hepatitis C antibody   Continue all meds Labs pending Health Maintenance reviewed Diet and exercise encouraged RTO 3 months  Evelina Dun, FNP

## 2014-10-22 ENCOUNTER — Other Ambulatory Visit: Payer: Self-pay | Admitting: Family

## 2014-10-22 DIAGNOSIS — E875 Hyperkalemia: Secondary | ICD-10-CM

## 2014-10-22 LAB — LIPID PANEL
CHOL/HDL RATIO: 3.2 ratio (ref 0.0–5.0)
Cholesterol, Total: 161 mg/dL (ref 100–199)
HDL: 50 mg/dL (ref >39)
LDL CALC: 88 mg/dL (ref 0–99)
TRIGLYCERIDES: 116 mg/dL (ref 0–149)
VLDL Cholesterol Cal: 23 mg/dL (ref 5–40)

## 2014-10-22 LAB — CMP14+EGFR
ALT: 15 IU/L (ref 0–44)
AST: 17 IU/L (ref 0–40)
Albumin/Globulin Ratio: 2.1 (ref 1.1–2.5)
Albumin: 4.6 g/dL (ref 3.6–4.8)
Alkaline Phosphatase: 77 IU/L (ref 39–117)
BUN / CREAT RATIO: 17 (ref 10–22)
BUN: 16 mg/dL (ref 8–27)
Bilirubin Total: 0.5 mg/dL (ref 0.0–1.2)
CALCIUM: 10 mg/dL (ref 8.6–10.2)
CO2: 24 mmol/L (ref 18–29)
CREATININE: 0.92 mg/dL (ref 0.76–1.27)
Chloride: 101 mmol/L (ref 97–106)
GFR calc Af Amer: 98 mL/min/{1.73_m2} (ref >59)
GFR, EST NON AFRICAN AMERICAN: 85 mL/min/{1.73_m2} (ref >59)
GLOBULIN, TOTAL: 2.2 g/dL (ref 1.5–4.5)
Glucose: 84 mg/dL (ref 65–99)
Potassium: 5.5 mmol/L — ABNORMAL HIGH (ref 3.5–5.2)
SODIUM: 138 mmol/L (ref 136–144)
Total Protein: 6.8 g/dL (ref 6.0–8.5)

## 2014-10-22 LAB — VITAMIN D 25 HYDROXY (VIT D DEFICIENCY, FRACTURES): Vit D, 25-Hydroxy: 19.4 ng/mL — ABNORMAL LOW (ref 30.0–100.0)

## 2014-10-22 LAB — HEPATITIS C ANTIBODY

## 2014-10-22 MED ORDER — VITAMIN D (ERGOCALCIFEROL) 1.25 MG (50000 UNIT) PO CAPS: 50000.0000 [IU] | ORAL_CAPSULE | ORAL | Status: DC

## 2014-10-23 ENCOUNTER — Other Ambulatory Visit (INDEPENDENT_AMBULATORY_CARE_PROVIDER_SITE_OTHER): Payer: Medicare Other

## 2014-10-23 DIAGNOSIS — E875 Hyperkalemia: Secondary | ICD-10-CM

## 2014-10-23 NOTE — Progress Notes (Signed)
Lab only 

## 2014-10-24 ENCOUNTER — Telehealth: Payer: Self-pay | Admitting: Family

## 2014-10-24 ENCOUNTER — Other Ambulatory Visit: Payer: Self-pay | Admitting: Family

## 2014-10-24 DIAGNOSIS — E875 Hyperkalemia: Secondary | ICD-10-CM

## 2014-10-24 LAB — BMP8+EGFR
BUN/Creatinine Ratio: 14 (ref 10–22)
BUN: 12 mg/dL (ref 8–27)
CALCIUM: 9.9 mg/dL (ref 8.6–10.2)
CHLORIDE: 98 mmol/L (ref 97–106)
CO2: 24 mmol/L (ref 18–29)
CREATININE: 0.84 mg/dL (ref 0.76–1.27)
GFR calc Af Amer: 104 mL/min/{1.73_m2} (ref >59)
GFR calc non Af Amer: 90 mL/min/{1.73_m2} (ref >59)
GLUCOSE: 95 mg/dL (ref 65–99)
Potassium: 5.4 mmol/L — ABNORMAL HIGH (ref 3.5–5.2)
Sodium: 138 mmol/L (ref 136–144)

## 2014-10-24 MED ORDER — SODIUM POLYSTYRENE SULFONATE 15 GM/60ML PO SUSP: 30.0000 g | Freq: Once | ORAL | Status: DC

## 2014-10-24 NOTE — Telephone Encounter (Signed)
Patient advised he is suppose to take 1108ms at once per medication instructions.

## 2014-10-27 ENCOUNTER — Other Ambulatory Visit (INDEPENDENT_AMBULATORY_CARE_PROVIDER_SITE_OTHER): Payer: Medicare Other

## 2014-10-27 DIAGNOSIS — E875 Hyperkalemia: Secondary | ICD-10-CM

## 2014-10-27 NOTE — Progress Notes (Signed)
Lab only 

## 2014-10-28 LAB — BMP8+EGFR
BUN / CREAT RATIO: 11 (ref 10–22)
BUN: 11 mg/dL (ref 8–27)
CO2: 24 mmol/L (ref 18–29)
CREATININE: 1.01 mg/dL (ref 0.76–1.27)
Calcium: 9.9 mg/dL (ref 8.6–10.2)
Chloride: 102 mmol/L (ref 97–106)
GFR calc Af Amer: 88 mL/min/{1.73_m2} (ref >59)
GFR, EST NON AFRICAN AMERICAN: 76 mL/min/{1.73_m2} (ref >59)
GLUCOSE: 95 mg/dL (ref 65–99)
Potassium: 5.3 mmol/L — ABNORMAL HIGH (ref 3.5–5.2)
Sodium: 141 mmol/L (ref 136–144)

## 2014-11-10 ENCOUNTER — Ambulatory Visit (INDEPENDENT_AMBULATORY_CARE_PROVIDER_SITE_OTHER): Payer: Medicare Other | Admitting: Family

## 2014-11-10 ENCOUNTER — Encounter: Payer: Self-pay | Admitting: Family

## 2014-11-10 VITALS — BP 119/65 | HR 62 | Temp 97.4°F | Ht 67.0 in | Wt 192.6 lb

## 2014-11-10 DIAGNOSIS — S134XXA Sprain of ligaments of cervical spine, initial encounter: Secondary | ICD-10-CM | POA: Diagnosis not present

## 2014-11-10 DIAGNOSIS — S139XXA Sprain of joints and ligaments of unspecified parts of neck, initial encounter: Secondary | ICD-10-CM

## 2014-11-10 MED ORDER — METHYLPREDNISOLONE ACETATE 80 MG/ML IJ SUSP: 80.0000 mg | Freq: Once | INTRAMUSCULAR | Status: DC

## 2014-11-10 MED ORDER — KETOROLAC TROMETHAMINE 60 MG/2ML IM SOLN
30.0000 mg | Freq: Once | INTRAMUSCULAR | Status: AC
  Administered 2014-11-10: 30 mg via INTRAMUSCULAR

## 2014-11-10 MED ORDER — NAPROXEN 500 MG PO TABS
500.0000 mg | ORAL_TABLET | Freq: Two times a day (BID) | ORAL | Status: DC
Start: 2014-11-10 — End: 2015-02-17

## 2014-11-10 MED ORDER — CYCLOBENZAPRINE HCL 10 MG PO TABS: 10.0000 mg | ORAL_TABLET | Freq: Three times a day (TID) | ORAL | Status: DC | PRN

## 2014-11-10 NOTE — Progress Notes (Signed)
   Subjective:    Patient ID: Dalton Miller, male    DOB: 11/29/1945, 69 y.o.   MRN: [id number]  Shoulder Pain  The pain is present in the right shoulder. This is a new problem. The current episode started 1 to 4 weeks ago. There has been no history of extremity trauma. The problem occurs constantly. The quality of the pain is described as aching. The pain is at a severity of 8/10. The pain is mild. Associated symptoms include joint locking and stiffness. Pertinent negatives include no inability to bear weight, limited range of motion, numbness or tingling. The symptoms are aggravated by heat. He has tried NSAIDS, rest and cold for the symptoms. The treatment provided mild relief.      Review of Systems  Constitutional: Negative.   HENT: Negative.   Respiratory: Negative.   Cardiovascular: Negative.   Gastrointestinal: Negative.   Endocrine: Negative.   Genitourinary: Negative.   Musculoskeletal: Positive for stiffness.  Neurological: Negative.  Negative for tingling and numbness.  Hematological: Negative.   Psychiatric/Behavioral: Negative.   All other systems reviewed and are negative.      Objective:   Physical Exam  Constitutional: He is oriented to person, place, and time. He appears well-developed and well-nourished. No distress.  HENT:  Head: Normocephalic.  Eyes: Pupils are equal, round, and reactive to light. Right eye exhibits no discharge. Left eye exhibits no discharge.  Neck: Normal range of motion. Neck supple. No thyromegaly present.  Cardiovascular: Normal rate, regular rhythm, normal heart sounds and intact distal pulses.   No murmur heard. Pulmonary/Chest: Effort normal and breath sounds normal. No respiratory distress. He has no wheezes.  Abdominal: Soft. Bowel sounds are normal. He exhibits no distension. There is no tenderness.  Musculoskeletal: Normal range of motion. He exhibits no edema or tenderness.  Full ROM of shoulder, pain with rotation of neck to a 90  degree   Neurological: He is alert and oriented to person, place, and time. He has normal reflexes. No cranial nerve deficit.  Skin: Skin is warm and dry. No rash noted. No erythema.  Psychiatric: He has a normal mood and affect. His behavior is normal. Judgment and thought content normal.  Vitals reviewed.   BP 119/65 mmHg  Pulse 62  Temp(Src) 97.4 F (36.3 C) (Oral)  Ht '5\' 7"'$  (1.702 m)  Wt 192 lb 9.6 oz (87.363 kg)  BMI 30.16 kg/m2       Assessment & Plan:  1. Cervical sprain, initial encounter -Rest -Ice and head as needed -Sedation precaution discussed -No other NSAID's while takng naproxen -Low carb diet RTO prn  - cyclobenzaprine (FLEXERIL) 10 MG tablet; Take 1 tablet (10 mg total) by mouth 3 (three) times daily as needed for muscle spasms.  Dispense: 30 tablet; Refill: 0 - naproxen (NAPROSYN) 500 MG tablet; Take 1 tablet (500 mg total) by mouth 2 (two) times daily with a meal.  Dispense: 30 tablet; Refill: 0 - methylPREDNISolone acetate (DEPO-MEDROL) injection 80 mg; Inject 1 mL (80 mg total) into the muscle once. - ketorolac (TORADOL) injection 30 mg; Inject 1 mL (30 mg total) into the muscle once.  Evelina Dun, FNP

## 2014-11-10 NOTE — Patient Instructions (Signed)
Cervical Sprain  A cervical sprain is an injury in the neck in which the strong, fibrous tissues (ligaments) that connect your neck bones stretch or tear. Cervical sprains can range from mild to severe. Severe cervical sprains can cause the neck vertebrae to be unstable. This can lead to damage of the spinal cord and can result in serious nervous system problems. The amount of time it takes for a cervical sprain to get better depends on the cause and extent of the injury. Most cervical sprains heal in 1 to 3 weeks.  CAUSES   Severe cervical sprains may be caused by:    Contact sport injuries (such as from football, rugby, wrestling, hockey, auto racing, gymnastics, diving, martial arts, or boxing).    Motor vehicle collisions.    Whiplash injuries. This is an injury from a sudden forward and backward whipping movement of the head and neck.   Falls.   Mild cervical sprains may be caused by:    Being in an awkward position, such as while cradling a telephone between your ear and shoulder.    Sitting in a chair that does not offer proper support.    Working at a poorly designed computer station.    Looking up or down for long periods of time.   SYMPTOMS    Pain, soreness, stiffness, or a burning sensation in the front, back, or sides of the neck. This discomfort may develop immediately after the injury or slowly, 24 hours or more after the injury.    Pain or tenderness directly in the middle of the back of the neck.    Shoulder or upper back pain.    Limited ability to move the neck.    Headache.    Dizziness.    Weakness, numbness, or tingling in the hands or arms.    Muscle spasms.    Difficulty swallowing or chewing.    Tenderness and swelling of the neck.   DIAGNOSIS   Most of the time your health care provider can diagnose a cervical sprain by taking your history and doing a physical exam. Your health care provider will ask about previous neck injuries and any known neck  problems, such as arthritis in the neck. X-rays may be taken to find out if there are any other problems, such as with the bones of the neck. Other tests, such as a CT scan or MRI, may also be needed.   TREATMENT   Treatment depends on the severity of the cervical sprain. Mild sprains can be treated with rest, keeping the neck in place (immobilization), and pain medicines. Severe cervical sprains are immediately immobilized. Further treatment is done to help with pain, muscle spasms, and other symptoms and may include:   Medicines, such as pain relievers, numbing medicines, or muscle relaxants.    Physical therapy. This may involve stretching exercises, strengthening exercises, and posture training. Exercises and improved posture can help stabilize the neck, strengthen muscles, and help stop symptoms from returning.   HOME CARE INSTRUCTIONS    Put ice on the injured area.     Put ice in a plastic bag.     Place a towel between your skin and the bag.     Leave the ice on for 15-20 minutes, 3-4 times a day.    If your injury was severe, you may have been given a cervical collar to wear. A cervical collar is a two-piece collar designed to keep your neck from moving while it heals.      Do not remove the collar unless instructed by your health care provider.    If you have long hair, keep it outside of the collar.    Ask your health care provider before making any adjustments to your collar. Minor adjustments may be required over time to improve comfort and reduce pressure on your chin or on the back of your head.    Ifyou are allowed to remove the collar for cleaning or bathing, follow your health care provider's instructions on how to do so safely.    Keep your collar clean by wiping it with mild soap and water and drying it completely. If the collar you have been given includes removable pads, remove them every 1-2 days and hand wash them with soap and water. Allow them to air dry. They should be completely  dry before you wear them in the collar.    If you are allowed to remove the collar for cleaning and bathing, wash and dry the skin of your neck. Check your skin for irritation or sores. If you see any, tell your health care provider.    Do not drive while wearing the collar.    Only take over-the-counter or prescription medicines for pain, discomfort, or fever as directed by your health care provider.    Keep all follow-up appointments as directed by your health care provider.    Keep all physical therapy appointments as directed by your health care provider.    Make any needed adjustments to your workstation to promote good posture.    Avoid positions and activities that make your symptoms worse.    Warm up and stretch before being active to help prevent problems.   SEEK MEDICAL CARE IF:    Your pain is not controlled with medicine.    You are unable to decrease your pain medicine over time as planned.    Your activity level is not improving as expected.   SEEK IMMEDIATE MEDICAL CARE IF:    You develop any bleeding.   You develop stomach upset.   You have signs of an allergic reaction to your medicine.    Your symptoms get worse.    You develop new, unexplained symptoms.    You have numbness, tingling, weakness, or paralysis in any part of your body.   MAKE SURE YOU:    Understand these instructions.   Will watch your condition.   Will get help right away if you are not doing well or get worse.     This information is not intended to replace advice given to you by your health care provider. Make sure you discuss any questions you have with your health care provider.     Document Released: 10/17/2006 Document Revised: 12/25/2012 Document Reviewed: 06/27/2012  [employer]vier Interactive Patient Education 2016 [employer]

## 2014-12-11 ENCOUNTER — Telehealth: Payer: Self-pay | Admitting: Family

## 2014-12-11 NOTE — Telephone Encounter (Signed)
Spoke to pt

## 2014-12-24 ENCOUNTER — Encounter: Payer: Self-pay | Admitting: *Deleted

## 2015-01-15 DIAGNOSIS — E119 Type 2 diabetes mellitus without complications: Secondary | ICD-10-CM | POA: Diagnosis not present

## 2015-01-15 DIAGNOSIS — H2513 Age-related nuclear cataract, bilateral: Secondary | ICD-10-CM | POA: Diagnosis not present

## 2015-01-15 DIAGNOSIS — H538 Other visual disturbances: Secondary | ICD-10-CM | POA: Diagnosis not present

## 2015-01-15 LAB — HM DIABETES EYE EXAM

## 2015-01-21 ENCOUNTER — Other Ambulatory Visit: Payer: Self-pay | Admitting: Family

## 2015-01-21 DIAGNOSIS — E785 Hyperlipidemia, unspecified: Secondary | ICD-10-CM

## 2015-01-21 DIAGNOSIS — I10 Essential (primary) hypertension: Secondary | ICD-10-CM

## 2015-01-21 MED ORDER — LISINOPRIL 10 MG PO TABS: 10.0000 mg | ORAL_TABLET | Freq: Every day | ORAL | Status: DC

## 2015-01-21 MED ORDER — GLIPIZIDE 5 MG PO TABS: 5.0000 mg | ORAL_TABLET | Freq: Every day | ORAL | Status: DC

## 2015-01-21 MED ORDER — VITAMIN D (ERGOCALCIFEROL) 1.25 MG (50000 UNIT) PO CAPS: 50000.0000 [IU] | ORAL_CAPSULE | ORAL | Status: DC

## 2015-01-21 MED ORDER — ATORVASTATIN CALCIUM 10 MG PO TABS: 10.0000 mg | ORAL_TABLET | Freq: Every day | ORAL | Status: DC

## 2015-01-21 NOTE — Telephone Encounter (Signed)
done

## 2015-01-23 ENCOUNTER — Telehealth: Payer: Self-pay | Admitting: Family

## 2015-01-26 MED ORDER — METFORMIN HCL 500 MG PO TABS
500.0000 mg | ORAL_TABLET | Freq: Two times a day (BID) | ORAL | Status: DC
Start: 2015-01-26 — End: 2015-05-21

## 2015-01-26 NOTE — Telephone Encounter (Signed)
done

## 2015-02-09 ENCOUNTER — Encounter: Payer: Self-pay | Admitting: *Deleted

## 2015-02-17 ENCOUNTER — Encounter: Payer: Self-pay | Admitting: Pediatrics

## 2015-02-17 ENCOUNTER — Ambulatory Visit (INDEPENDENT_AMBULATORY_CARE_PROVIDER_SITE_OTHER): Payer: Commercial Managed Care - HMO | Admitting: Pediatrics

## 2015-02-17 VITALS — BP 152/81 | HR 52 | Temp 97.4°F | Ht 67.0 in | Wt 191.2 lb

## 2015-02-17 DIAGNOSIS — R319 Hematuria, unspecified: Secondary | ICD-10-CM | POA: Diagnosis not present

## 2015-02-17 LAB — POCT URINALYSIS DIPSTICK
BILIRUBIN UA: NEGATIVE
Glucose, UA: NEGATIVE
KETONES UA: NEGATIVE
LEUKOCYTES UA: NEGATIVE
NITRITE UA: NEGATIVE
PROTEIN UA: NEGATIVE
Spec Grav, UA: 1.01
Urobilinogen, UA: NEGATIVE
pH, UA: 7

## 2015-02-17 LAB — POCT UA - MICROSCOPIC ONLY
CASTS, UR, LPF, POC: NEGATIVE
Crystals, Ur, HPF, POC: NEGATIVE
MUCUS UA: NEGATIVE
Yeast, UA: NEGATIVE

## 2015-02-17 NOTE — Progress Notes (Signed)
    Subjective:    Patient ID: Dalton Miller, male    DOB: 1945-11-10, 70 y.o.   MRN: [id number]  CC: Hematuria   HPI: Dalton Miller is a 70 y.o. male presenting for Hematuria  Feels good now This morning at 5am he had dark urine, made toilet bowel maroon, burgundy colored  No pain in abd Later in the morning urine less dark, still red Next void was clear No prior problems with prostate bladder or kidneys  Current smoker Never had this problem before Appetite normal Has lost apprx 15-20 lbs over past year without trying  Depression screen [facility] 2/9 10/21/2014 02/14/2014 02/14/2014 01/10/2013  Decreased Interest 0 0 0 0  Down, Depressed, Hopeless 0 0 0 0  PHQ - 2 Score 0 0 0 0     Relevant past medical, surgical, family and social history reviewed and updated as indicated. Interim medical history since our last visit reviewed. Allergies and medications reviewed and updated.   ROS: Per HPI unless specifically indicated above  History  Smoking status  . Current Every Day Smoker  Smokeless tobacco  . Not on file    Past Medical History Patient Active Problem List   Diagnosis Date Noted  . Vitamin D deficiency 02/16/2014  . Essential hypertension 02/14/2014  . Tobacco user   . Erectile dysfunction   . Diabetes mellitus ([place]) 03/22/2010  . Hyperlipidemia 03/22/2010        Objective:    BP 152/81 mmHg  Pulse 52  Temp(Src) 97.4 F (36.3 C) (Oral)  Ht '5\' 7"'$  (1.702 m)  Wt 191 lb 3.2 oz (86.728 kg)  BMI 29.94 kg/m2  Wt Readings from Last 3 Encounters:  02/17/15 191 lb 3.2 oz (86.728 kg)  11/10/14 192 lb 9.6 oz (87.363 kg)  10/21/14 187 lb (84.823 kg)     Gen: NAD, alert, cooperative with exam, NCAT EYES: EOMI, no scleral injection or icterus ENT: OP without erythema LYMPH: no cervical LAD CV: NRRR, normal S1/S2, no murmur, distal pulses 2+ b/l Resp: CTABL, no wheezes, normal WOB Abd: +BS, soft, NTND. no guarding or organomegaly, no CVA tenderness Ext: No  edema, warm Neuro: Alert and oriented, strength equal b/l UE and LE, coordination grossly normal MSK: normal muscle bulk     Assessment & Plan:    Dalton Miller was seen today for painless hematuria. Had gross hematuria per pt report this morning. Now with large amount blood, RBCs in clear urine. Long h/o smoking, current smoker. Will get CT, refer to urology. Discussed with pt, in agreement with plan.  Diagnoses and all orders for this visit:  Blood in urine -     POCT urinalysis dipstick -     POCT UA - Microscopic Only -     CBC -     CMP14+EGFR -     CT RENAL STONE STUDY; Future -     Ambulatory referral to Urology   Follow up plan: As needed  Assunta Found, MD [facility] 02/17/2015, 5:45 PM

## 2015-02-18 ENCOUNTER — Telehealth: Payer: Self-pay | Admitting: Pediatrics

## 2015-02-18 LAB — CMP14+EGFR
ALBUMIN: 4.3 g/dL (ref 3.6–4.8)
ALK PHOS: 74 IU/L (ref 39–117)
ALT: 11 IU/L (ref 0–44)
AST: 14 IU/L (ref 0–40)
Albumin/Globulin Ratio: 1.9 (ref 1.1–2.5)
BILIRUBIN TOTAL: 0.3 mg/dL (ref 0.0–1.2)
BUN / CREAT RATIO: 15 (ref 10–22)
BUN: 11 mg/dL (ref 8–27)
CHLORIDE: 100 mmol/L (ref 96–106)
CO2: 26 mmol/L (ref 18–29)
CREATININE: 0.73 mg/dL — AB (ref 0.76–1.27)
Calcium: 9.3 mg/dL (ref 8.6–10.2)
GFR calc Af Amer: 109 mL/min/{1.73_m2} (ref >59)
GFR calc non Af Amer: 95 mL/min/{1.73_m2} (ref >59)
GLUCOSE: 67 mg/dL (ref 65–99)
Globulin, Total: 2.3 g/dL (ref 1.5–4.5)
Potassium: 4.5 mmol/L (ref 3.5–5.2)
Sodium: 141 mmol/L (ref 134–144)
Total Protein: 6.6 g/dL (ref 6.0–8.5)

## 2015-02-18 LAB — CBC
HEMOGLOBIN: 16.6 g/dL (ref 12.6–17.7)
Hematocrit: 48.8 % (ref 37.5–51.0)
MCH: 32.6 pg (ref 26.6–33.0)
MCHC: 34 g/dL (ref 31.5–35.7)
MCV: 96 fL (ref 79–97)
PLATELETS: 169 10*3/uL (ref 150–379)
RBC: 5.09 x10E6/uL (ref 4.14–5.80)
RDW: 13.7 % (ref 12.3–15.4)
WBC: 8.6 10*3/uL (ref 3.4–10.8)

## 2015-02-18 NOTE — Telephone Encounter (Signed)
Questions answered.

## 2015-02-20 ENCOUNTER — Ambulatory Visit (HOSPITAL_COMMUNITY)
Admission: RE | Admit: 2015-02-20 | Discharge: 2015-02-20 | Disposition: A | Discharge status: Home / Self Care | Payer: Commercial Managed Care - HMO | Source: Ambulatory Visit | Attending: Pediatrics | Admitting: Pediatrics

## 2015-02-20 DIAGNOSIS — N200 Calculus of kidney: Secondary | ICD-10-CM | POA: Insufficient documentation

## 2015-02-20 DIAGNOSIS — R319 Hematuria, unspecified: Secondary | ICD-10-CM | POA: Insufficient documentation

## 2015-02-20 DIAGNOSIS — D3502 Benign neoplasm of left adrenal gland: Secondary | ICD-10-CM | POA: Insufficient documentation

## 2015-02-20 DIAGNOSIS — I700 Atherosclerosis of aorta: Secondary | ICD-10-CM | POA: Diagnosis not present

## 2015-02-20 DIAGNOSIS — N281 Cyst of kidney, acquired: Secondary | ICD-10-CM | POA: Insufficient documentation

## 2015-03-19 ENCOUNTER — Telehealth: Payer: Self-pay | Admitting: Pediatrics

## 2015-03-19 NOTE — Telephone Encounter (Signed)
His CT scan from 2/17? I called him the next day and we discussed even though we didn't see anything on it because of the hemauria he still needs to see a urologist. Was there another study that was done? And has he been able to get in to see the urologist yet?

## 2015-03-19 NOTE — Telephone Encounter (Signed)
Pt sees the urologist next Friday and he had forgotten he had already spoke to you.

## 2015-03-27 ENCOUNTER — Ambulatory Visit (INDEPENDENT_AMBULATORY_CARE_PROVIDER_SITE_OTHER): Payer: Commercial Managed Care - HMO | Admitting: Urology

## 2015-03-27 ENCOUNTER — Other Ambulatory Visit: Payer: Self-pay | Admitting: Urology

## 2015-03-27 DIAGNOSIS — N200 Calculus of kidney: Secondary | ICD-10-CM

## 2015-03-27 DIAGNOSIS — R310 Gross hematuria: Secondary | ICD-10-CM | POA: Diagnosis not present

## 2015-04-06 ENCOUNTER — Ambulatory Visit (HOSPITAL_COMMUNITY)
Admission: RE | Admit: 2015-04-06 | Discharge: 2015-04-06 | Disposition: A | Discharge status: Home / Self Care | Payer: Commercial Managed Care - HMO | Source: Ambulatory Visit | Attending: Urology | Admitting: Urology

## 2015-04-06 DIAGNOSIS — D3502 Benign neoplasm of left adrenal gland: Secondary | ICD-10-CM | POA: Insufficient documentation

## 2015-04-06 DIAGNOSIS — K760 Fatty (change of) liver, not elsewhere classified: Secondary | ICD-10-CM | POA: Diagnosis not present

## 2015-04-06 DIAGNOSIS — R310 Gross hematuria: Secondary | ICD-10-CM

## 2015-04-06 DIAGNOSIS — N281 Cyst of kidney, acquired: Secondary | ICD-10-CM | POA: Diagnosis not present

## 2015-04-06 DIAGNOSIS — N200 Calculus of kidney: Secondary | ICD-10-CM | POA: Diagnosis not present

## 2015-04-06 LAB — POCT I-STAT CREATININE: Creatinine, Ser: 0.8 mg/dL (ref 0.61–1.24)

## 2015-04-06 MED ORDER — SODIUM CHLORIDE 0.9 % IV SOLN
INTRAVENOUS | Status: AC
  Filled 2015-04-06: qty 250

## 2015-04-06 MED ORDER — IOPAMIDOL (ISOVUE-300) INJECTION 61%
125.0000 mL | Freq: Once | INTRAVENOUS | Status: AC | PRN
  Administered 2015-04-06: 125 mL via INTRAVENOUS

## 2015-05-08 ENCOUNTER — Ambulatory Visit: Payer: Medicare Other | Admitting: Urology

## 2015-05-15 ENCOUNTER — Other Ambulatory Visit: Payer: Self-pay | Admitting: Family

## 2015-05-19 ENCOUNTER — Other Ambulatory Visit: Payer: Self-pay

## 2015-05-19 DIAGNOSIS — I10 Essential (primary) hypertension: Secondary | ICD-10-CM

## 2015-05-19 MED ORDER — LISINOPRIL 10 MG PO TABS: 10.0000 mg | ORAL_TABLET | Freq: Every day | ORAL | Status: DC

## 2015-05-19 MED ORDER — GLIPIZIDE 5 MG PO TABS: 5.0000 mg | ORAL_TABLET | Freq: Every day | ORAL | Status: DC

## 2015-05-21 ENCOUNTER — Other Ambulatory Visit: Payer: Self-pay | Admitting: Family

## 2015-07-02 ENCOUNTER — Encounter (HOSPITAL_COMMUNITY): Payer: Self-pay | Admitting: Emergency Medicine

## 2015-07-02 ENCOUNTER — Emergency Department (HOSPITAL_COMMUNITY)
Admission: EM | Admit: 2015-07-02 | Discharge: 2015-07-02 | Disposition: A | Discharge status: Home / Self Care | Payer: Commercial Managed Care - HMO | Attending: Emergency Medicine | Admitting: Emergency Medicine

## 2015-07-02 ENCOUNTER — Emergency Department (HOSPITAL_COMMUNITY): Payer: Commercial Managed Care - HMO

## 2015-07-02 DIAGNOSIS — Z7984 Long term (current) use of oral hypoglycemic drugs: Secondary | ICD-10-CM | POA: Insufficient documentation

## 2015-07-02 DIAGNOSIS — N132 Hydronephrosis with renal and ureteral calculous obstruction: Secondary | ICD-10-CM | POA: Diagnosis not present

## 2015-07-02 DIAGNOSIS — N201 Calculus of ureter: Secondary | ICD-10-CM | POA: Diagnosis not present

## 2015-07-02 DIAGNOSIS — Z79899 Other long term (current) drug therapy: Secondary | ICD-10-CM | POA: Diagnosis not present

## 2015-07-02 DIAGNOSIS — I10 Essential (primary) hypertension: Secondary | ICD-10-CM | POA: Diagnosis not present

## 2015-07-02 DIAGNOSIS — E119 Type 2 diabetes mellitus without complications: Secondary | ICD-10-CM | POA: Insufficient documentation

## 2015-07-02 DIAGNOSIS — E785 Hyperlipidemia, unspecified: Secondary | ICD-10-CM | POA: Diagnosis not present

## 2015-07-02 DIAGNOSIS — F17200 Nicotine dependence, unspecified, uncomplicated: Secondary | ICD-10-CM | POA: Diagnosis not present

## 2015-07-02 DIAGNOSIS — Z7982 Long term (current) use of aspirin: Secondary | ICD-10-CM | POA: Diagnosis not present

## 2015-07-02 DIAGNOSIS — R109 Unspecified abdominal pain: Secondary | ICD-10-CM | POA: Diagnosis present

## 2015-07-02 LAB — CBC WITH DIFFERENTIAL/PLATELET
Basophils Absolute: 0.1 10*3/uL (ref 0.0–0.1)
Basophils Relative: 0 %
Eosinophils Absolute: 0.1 10*3/uL (ref 0.0–0.7)
Eosinophils Relative: 1 %
HEMATOCRIT: 47.7 % (ref 39.0–52.0)
HEMOGLOBIN: 16.5 g/dL (ref 13.0–17.0)
LYMPHS ABS: 1.7 10*3/uL (ref 0.7–4.0)
LYMPHS PCT: 14 %
MCH: 32.8 pg (ref 26.0–34.0)
MCHC: 34.6 g/dL (ref 30.0–36.0)
MCV: 94.8 fL (ref 78.0–100.0)
Monocytes Absolute: 0.7 10*3/uL (ref 0.1–1.0)
Monocytes Relative: 6 %
NEUTROS ABS: 9.7 10*3/uL — AB (ref 1.7–7.7)
NEUTROS PCT: 79 %
Platelets: 178 10*3/uL (ref 150–400)
RBC: 5.03 MIL/uL (ref 4.22–5.81)
RDW: 13.3 % (ref 11.5–15.5)
WBC: 12.2 10*3/uL — AB (ref 4.0–10.5)

## 2015-07-02 LAB — URINALYSIS, ROUTINE W REFLEX MICROSCOPIC
BILIRUBIN URINE: NEGATIVE
Glucose, UA: NEGATIVE mg/dL
Ketones, ur: NEGATIVE mg/dL
Leukocytes, UA: NEGATIVE
NITRITE: NEGATIVE
PROTEIN: NEGATIVE mg/dL
Specific Gravity, Urine: 1.02 (ref 1.005–1.030)
pH: 5.5 (ref 5.0–8.0)

## 2015-07-02 LAB — COMPREHENSIVE METABOLIC PANEL
ALBUMIN: 4 g/dL (ref 3.5–5.0)
ALT: 11 U/L — ABNORMAL LOW (ref 17–63)
ANION GAP: 3 — AB (ref 5–15)
AST: 16 U/L (ref 15–41)
Alkaline Phosphatase: 67 U/L (ref 38–126)
BUN: 14 mg/dL (ref 6–20)
CHLORIDE: 106 mmol/L (ref 101–111)
CO2: 29 mmol/L (ref 22–32)
Calcium: 8.9 mg/dL (ref 8.9–10.3)
Creatinine, Ser: 0.9 mg/dL (ref 0.61–1.24)
GFR calc non Af Amer: >60 mL/min (ref >60)
GLUCOSE: 153 mg/dL — AB (ref 65–99)
Potassium: 5.2 mmol/L — ABNORMAL HIGH (ref 3.5–5.1)
SODIUM: 138 mmol/L (ref 135–145)
Total Bilirubin: 0.6 mg/dL (ref 0.3–1.2)
Total Protein: 7.1 g/dL (ref 6.5–8.1)

## 2015-07-02 LAB — URINE MICROSCOPIC-ADD ON: SQUAMOUS EPITHELIAL / LPF: NONE SEEN

## 2015-07-02 LAB — LIPASE, BLOOD: Lipase: 22 U/L (ref 11–51)

## 2015-07-02 MED ORDER — OXYCODONE-ACETAMINOPHEN 5-325 MG PO TABS: 1.0000 | ORAL_TABLET | Freq: Four times a day (QID) | ORAL | Status: DC | PRN

## 2015-07-02 MED ORDER — KETOROLAC TROMETHAMINE 30 MG/ML IJ SOLN
15.0000 mg | Freq: Once | INTRAMUSCULAR | Status: AC
  Administered 2015-07-02: 15 mg via INTRAVENOUS
  Filled 2015-07-02: qty 1

## 2015-07-02 MED ORDER — HYDROMORPHONE HCL 1 MG/ML IJ SOLN
1.0000 mg | Freq: Once | INTRAMUSCULAR | Status: AC
  Administered 2015-07-02: 1 mg via INTRAVENOUS
  Filled 2015-07-02: qty 1

## 2015-07-02 MED ORDER — SODIUM CHLORIDE 0.9 % IV SOLN
INTRAVENOUS | Status: DC
  Administered 2015-07-02: 08:00:00 via INTRAVENOUS

## 2015-07-02 MED ORDER — TAMSULOSIN HCL 0.4 MG PO CAPS: 0.4000 mg | ORAL_CAPSULE | Freq: Every day | ORAL | Status: DC

## 2015-07-02 MED ORDER — ONDANSETRON HCL 4 MG/2ML IJ SOLN
4.0000 mg | Freq: Once | INTRAMUSCULAR | Status: AC
  Administered 2015-07-02: 4 mg via INTRAVENOUS
  Filled 2015-07-02: qty 2

## 2015-07-02 MED ORDER — ONDANSETRON HCL 4 MG PO TABS: 4.0000 mg | ORAL_TABLET | Freq: Four times a day (QID) | ORAL | Status: DC

## 2015-07-02 MED ORDER — OXYCODONE-ACETAMINOPHEN 5-325 MG PO TABS
1.0000 | ORAL_TABLET | ORAL | Status: DC | PRN
  Administered 2015-07-02: 1 via ORAL
  Filled 2015-07-02: qty 1

## 2015-07-02 NOTE — ED Provider Notes (Signed)
CSN: [id number]     Arrival date & time 07/02/15  0507 History   First MD Initiated Contact with Patient 07/02/15 [phone]     Chief Complaint  Patient presents with  . Flank Pain    HPI Pt woke up early this am with pain in his back mostly on the right but it started on the left.  The pain increased over the next hour or so. It became very severe and he became nauseated.  The pain moved towards the middle of his stomach.  No groin or testicle pain.  No difficulty urinating.  No fevers.  No chest pain or shortness of breath.  Pt was given a percocet while waiting and he is feeling better now.  Pt had a similar episode almost 20 years ago and diagnosed with kidney stones.   Past Medical History  Diagnosis Date  . Cellulitis 03/16/10    resolved in left wrist   . RUQ pain     resolved   . Diabetes mellitus   . Hypertension   . Hyperlipidemia   . Abscess   . Erectile dysfunction   . Tobacco user    Past Surgical History  Procedure Laterality Date  . Replacement total knee    . Hand surgery Right   . Carpal tunnel release Right    Family History  Problem Relation Age of Onset  . Diabetes Mother   . Heart disease Mother   . Diabetes Father    Social History  Substance Use Topics  . Smoking status: Current Every Day Smoker  . Smokeless tobacco: None  . Alcohol Use: No    Review of Systems  All other systems reviewed and are negative.     Allergies  Review of patient's allergies indicates no known allergies.  Home Medications   Prior to Admission medications   Medication Sig Start Date End Date Taking? Authorizing Provider  aspirin EC 81 MG tablet Take 1 tablet (81 mg total) by mouth daily. 10/21/14  Yes Sharion Balloon, FNP  atorvastatin (LIPITOR) 10 MG tablet TAKE 1 TABLET BY MOUTH DAILY. 05/15/15  Yes Sharion Balloon, FNP  glipiZIDE (GLUCOTROL) 5 MG tablet Take 1 tablet (5 mg total) by mouth daily. 05/19/15  Yes Mary-Margaret Hassell Done, FNP  glucose blood test strip Use  as instructed 10/21/14  Yes Sharion Balloon, FNP  lisinopril (PRINIVIL,ZESTRIL) 10 MG tablet Take 1 tablet (10 mg total) by mouth daily. 05/19/15  Yes Sharion Balloon, FNP  metFORMIN (GLUCOPHAGE) 500 MG tablet TAKE (1) TABLET BY MOUTH TWICE DAILY WITH A MEAL. 05/21/15  Yes Sharion Balloon, FNP  ondansetron (ZOFRAN) 4 MG tablet Take 1 tablet (4 mg total) by mouth every 6 (six) hours. 07/02/15   Dorie Rank, MD  oxyCODONE-acetaminophen (PERCOCET/ROXICET) 5-325 MG tablet Take 1-2 tablets by mouth every 6 (six) hours as needed. 07/02/15   Dorie Rank, MD  tamsulosin (FLOMAX) 0.4 MG CAPS capsule Take 1 capsule (0.4 mg total) by mouth daily after supper. 07/02/15   Dorie Rank, MD   BP 134/74 mmHg  Pulse 55  Temp(Src) 97.8 F (36.6 C) (Oral)  Resp 15  Ht '5\' 7"'$  (1.702 m)  Wt 83.462 kg  BMI 28.81 kg/m2  SpO2 97% Physical Exam  Constitutional: He appears well-developed and well-nourished. No distress.  HENT:  Head: Normocephalic and atraumatic.  Right Ear: External ear normal.  Left Ear: External ear normal.  Eyes: Conjunctivae are normal. Right eye exhibits no discharge. Left eye exhibits no  discharge. No scleral icterus.  Neck: Neck supple. No tracheal deviation present.  Cardiovascular: Normal rate, regular rhythm and intact distal pulses.   Pulmonary/Chest: Effort normal and breath sounds normal. No stridor. No respiratory distress. He has no wheezes. He has no rales.  Abdominal: Soft. Bowel sounds are normal. He exhibits no distension. There is no tenderness. There is no rebound and no guarding.  Musculoskeletal: He exhibits no edema or tenderness.  Neurological: He is alert. He has normal strength. No cranial nerve deficit (no facial droop, extraocular movements intact, no slurred speech) or sensory deficit. He exhibits normal muscle tone. He displays no seizure activity. Coordination normal.  Skin: Skin is warm and dry. No rash noted.  Psychiatric: He has a normal mood and affect.  Nursing note and  vitals reviewed.   ED Course  Procedures  Medications given in the ED Medications  oxyCODONE-acetaminophen (PERCOCET/ROXICET) 5-325 MG per tablet 1 tablet (1 tablet Oral Given 07/02/15 0551)  0.9 %  sodium chloride infusion ( Intravenous New Bag/Given 07/02/15 0748)  ondansetron (ZOFRAN) injection 4 mg (4 mg Intravenous Given 07/02/15 0551)  HYDROmorphone (DILAUDID) injection 1 mg (1 mg Intravenous Given 07/02/15 0952)  ketorolac (TORADOL) 30 MG/ML injection 15 mg (15 mg Intravenous Given 07/02/15 0952)     Labs Review Labs Reviewed  URINALYSIS, ROUTINE W REFLEX MICROSCOPIC (NOT AT [facility]) - Abnormal; Notable for the following:    APPearance HAZY (*)    Hgb urine dipstick LARGE (*)    All other components within normal limits  COMPREHENSIVE METABOLIC PANEL - Abnormal; Notable for the following:    Potassium 5.2 (*)    Glucose, Bld 153 (*)    ALT 11 (*)    Anion gap 3 (*)    All other components within normal limits  CBC WITH DIFFERENTIAL/PLATELET - Abnormal; Notable for the following:    WBC 12.2 (*)    Neutro Abs 9.7 (*)    All other components within normal limits  URINE MICROSCOPIC-ADD ON - Abnormal; Notable for the following:    Bacteria, UA FEW (*)    All other components within normal limits  LIPASE, BLOOD    Imaging Review Ct Abdomen Pelvis Wo Contrast  07/02/2015  CLINICAL DATA:  Right flank pain starting today 20:30 a.m., history of kidney stones EXAM: CT ABDOMEN AND PELVIS WITHOUT CONTRAST TECHNIQUE: Multidetector CT imaging of the abdomen and pelvis was performed following the standard protocol without IV contrast. COMPARISON:  04/06/2015 FINDINGS: Lower chest:  The lung bases are unremarkable. Hepatobiliary: Unenhanced liver shows no biliary ductal dilatation. No calcified gallstones are noted within gallbladder. Pancreas: Unenhanced pancreas is unremarkable. Spleen: Unenhanced spleen is unremarkable. Adrenals/Urinary Tract: Stable low-density left adrenal probable adenoma  measures 1.7 cm. There is nonobstructive calcified calculus in upper pole of the left kidney measures 3.5 mm. Nonobstructive calcified calculus in midpole of the right kidney measures 4.5 mm. There is mild right hydronephrosis and proximal right hydroureter. Significant right perinephric stranding. Axial image 51 there is 4 mm calcified obstructive calculus in proximal right ureter at the level of upper endplate of L4 vertebral body. Bilateral distal ureter is unremarkable. No calcified calculi are noted within urinary bladder. Stable 4.2 cm cyst in lower pole posterior aspect right kidney. Stomach/Bowel: No gastric outlet obstruction. No small bowel obstruction. No thickened or dilated small bowel loops. Abundant stool noted within cecum. Normal appendix is noted in axial image 67. Terminal ileum is unremarkable. No pericecal inflammation. Moderate stool noted within right colon. Moderate  stool is in some colonic gas noted within transverse colon. No distal colonic obstruction or diverticulitis. Vascular/Lymphatic: Atherosclerotic calcifications of abdominal aorta and iliac arteries. No aortic aneurysm. No retroperitoneal or mesenteric adenopathy. Reproductive: No pelvic masses noted. Prostate gland and seminal vesicles are unremarkable. Other: Bilateral inguinal canal small hernia containing fat without evidence of acute complication. No ascites or free abdominal air. Musculoskeletal: No destructive bony lesions are noted. Mild degenerative changes lower thoracic and lumbar spine. Mild degenerative changes bilateral SI joints. IMPRESSION: 1. There is bilateral nonobstructive nephrolithiasis. Mild right hydronephrosis and proximal right hydroureter. Significant right perinephric stranding. 2. There is 4 mm calcified obstructive calculus in proximal right ureter at the level of upper endplate of L4 vertebral body. 3. No calcified calculi are noted within urinary bladder. 4. Stable cyst in lower pole of the right  kidney 5. Stable left adrenal adenoma. 6. No pericecal inflammation.  Normal appendix. 7. No small bowel or colonic obstruction.  No diverticulitis. Electronically Signed   By: Lahoma Crocker M.D.   On: 07/02/2015 08:31   I have personally reviewed and evaluated these images and lab results as part of my medical decision-making.    MDM   Final diagnoses:  Right ureteral stone    CT scan shows a right ureteral stone.  Hx and exam are consistent with renal colic.  Sx improved with treatment  Will dc home with pain meds.  Flomax and zofran.  Follow up with urology    Dorie Rank, MD 07/02/15 [phone]

## 2015-07-02 NOTE — Discharge Instructions (Signed)
Kidney Stones °Kidney stones (urolithiasis) are deposits that form inside your kidneys. The intense pain is caused by the stone moving through the urinary tract. When the stone moves, the ureter goes into spasm around the stone. The stone is usually passed in the urine.  °CAUSES  °· A disorder that makes certain neck glands produce too much parathyroid hormone (primary hyperparathyroidism). °· A buildup of uric acid crystals, similar to gout in your joints. °· Narrowing (stricture) of the ureter. °· A kidney obstruction present at birth (congenital obstruction). °· Previous surgery on the kidney or ureters. °· Numerous kidney infections. °SYMPTOMS  °· Feeling sick to your stomach (nauseous). °· Throwing up (vomiting). °· Blood in the urine (hematuria). °· Pain that usually spreads (radiates) to the groin. °· Frequency or urgency of urination. °DIAGNOSIS  °· Taking a history and physical exam. °· Blood or urine tests. °· CT scan. °· Occasionally, an examination of the inside of the urinary bladder (cystoscopy) is performed. °TREATMENT  °· Observation. °· Increasing your fluid intake. °· Extracorporeal shock wave lithotripsy--This is a noninvasive procedure that uses shock waves to break up kidney stones. °· Surgery may be needed if you have severe pain or persistent obstruction. There are various surgical procedures. Most of the procedures are performed with the use of small instruments. Only small incisions are needed to accommodate these instruments, so recovery time is minimized. °The size, location, and chemical composition are all important variables that will determine the proper choice of action for you. Talk to your health care provider to better understand your situation so that you will minimize the risk of injury to yourself and your kidney.  °HOME CARE INSTRUCTIONS  °· Drink enough water and fluids to keep your urine clear or pale yellow. This will help you to pass the stone or stone fragments. °· Strain  all urine through the provided strainer. Keep all particulate matter and stones for your health care provider to see. The stone causing the pain may be as small as a grain of salt. It is very important to use the strainer each and every time you pass your urine. The collection of your stone will allow your health care provider to analyze it and verify that a stone has actually passed. The stone analysis will often identify what you can do to reduce the incidence of recurrences. °· Only take over-the-counter or prescription medicines for pain, discomfort, or fever as directed by your health care provider. °· Keep all follow-up visits as told by your health care provider. This is important. °· Get follow-up X-rays if required. The absence of pain does not always mean that the stone has passed. It may have only stopped moving. If the urine remains completely obstructed, it can cause loss of kidney function or even complete destruction of the kidney. It is your responsibility to make sure X-rays and follow-ups are completed. Ultrasounds of the kidney can show blockages and the status of the kidney. Ultrasounds are not associated with any radiation and can be performed easily in a matter of minutes. °· Make changes to your daily diet as told by your health care provider. You may be told to: °¨ Limit the amount of salt that you eat. °¨ Eat 5 or more servings of fruits and vegetables each day. °¨ Limit the amount of meat, poultry, fish, and eggs that you eat. °· Collect a 24-hour urine sample as told by your health care provider. You may need to collect another urine sample every 6-12   months. °SEEK MEDICAL CARE IF: °· You experience pain that is progressive and unresponsive to any pain medicine you have been prescribed. °SEEK IMMEDIATE MEDICAL CARE IF:  °· Pain cannot be controlled with the prescribed medicine. °· You have a fever or shaking chills. °· The severity or intensity of pain increases over 18 hours and is not  relieved by pain medicine. °· You develop a new onset of abdominal pain. °· You feel faint or pass out. °· You are unable to urinate. °  °This information is not intended to replace advice given to you by your health care provider. Make sure you discuss any questions you have with your health care provider. °  °Document Released: 12/20/2004 Document Revised: 09/10/2014 Document Reviewed: 05/23/2012 °Elsevier Interactive Patient Education ©2016 [employer] ° °

## 2015-07-02 NOTE — ED Notes (Signed)
Reports some pain relief- reports he still cannot urinate

## 2015-07-02 NOTE — ED Notes (Signed)
Pt with history of kidney stones- awakened this morning around 0230 with R flank pain radiating to the front

## 2015-07-02 NOTE — ED Notes (Signed)
Lying on R side eyes closed and reports pain is "pretty good right now. Encouraged to try to urinate and pt reports that he cannot right now

## 2015-07-27 ENCOUNTER — Encounter: Payer: Self-pay | Admitting: Family

## 2015-07-27 ENCOUNTER — Ambulatory Visit (INDEPENDENT_AMBULATORY_CARE_PROVIDER_SITE_OTHER): Payer: Commercial Managed Care - HMO | Admitting: Family

## 2015-07-27 VITALS — BP 125/67 | HR 70 | Temp 97.9°F | Ht 67.0 in | Wt 183.2 lb

## 2015-07-27 DIAGNOSIS — I10 Essential (primary) hypertension: Secondary | ICD-10-CM | POA: Diagnosis not present

## 2015-07-27 DIAGNOSIS — E1165 Type 2 diabetes mellitus with hyperglycemia: Secondary | ICD-10-CM | POA: Diagnosis not present

## 2015-07-27 DIAGNOSIS — E559 Vitamin D deficiency, unspecified: Secondary | ICD-10-CM

## 2015-07-27 DIAGNOSIS — E663 Overweight: Secondary | ICD-10-CM

## 2015-07-27 DIAGNOSIS — Z720 Tobacco use: Secondary | ICD-10-CM

## 2015-07-27 DIAGNOSIS — E785 Hyperlipidemia, unspecified: Secondary | ICD-10-CM | POA: Diagnosis not present

## 2015-07-27 DIAGNOSIS — Z23 Encounter for immunization: Secondary | ICD-10-CM

## 2015-07-27 DIAGNOSIS — Z125 Encounter for screening for malignant neoplasm of prostate: Secondary | ICD-10-CM

## 2015-07-27 LAB — BAYER DCA HB A1C WAIVED: HB A1C: 6.1 % (ref <7.0)

## 2015-07-27 MED ORDER — LISINOPRIL 10 MG PO TABS: 10.0000 mg | ORAL_TABLET | Freq: Every day | ORAL | 0 refills | Status: DC

## 2015-07-27 MED ORDER — GLIPIZIDE 5 MG PO TABS: 5.0000 mg | ORAL_TABLET | Freq: Every day | ORAL | 2 refills | Status: DC

## 2015-07-27 MED ORDER — ATORVASTATIN CALCIUM 10 MG PO TABS: 10.0000 mg | ORAL_TABLET | Freq: Every day | ORAL | 2 refills | Status: DC

## 2015-07-27 MED ORDER — METFORMIN HCL 500 MG PO TABS: ORAL_TABLET | ORAL | 2 refills | Status: DC

## 2015-07-27 NOTE — Addendum Note (Signed)
Addended by: Shelbie Ammons on: 07/27/2015 11:21 AM   Modules accepted: Orders

## 2015-07-27 NOTE — Progress Notes (Signed)
Subjective:    Patient ID: Dalton Miller, male    DOB: 1945-11-18, 70 y.o.   MRN: [id number]   Pt presents to the office today for chronic follow up.  PT states he went ED on 06/29 for kidney stones. PT reports doing much better now and denies any pain at this time. Pt states has far as he knows he has not passed any stone.    Diabetes  He presents for his follow-up diabetic visit. He has type 2 diabetes mellitus. His disease course has been fluctuating. There are no hypoglycemic associated symptoms. Pertinent negatives for hypoglycemia include no confusion, dizziness, headaches or mood changes. Associated symptoms include foot paresthesias (At times). Pertinent negatives for diabetes include no foot ulcerations and no visual change. There are no hypoglycemic complications. Symptoms are stable. Diabetic complications include peripheral neuropathy. Pertinent negatives for diabetic complications include no CVA, heart disease or nephropathy. Risk factors for coronary artery disease include dyslipidemia, male sex and tobacco exposure. Current diabetic treatment includes oral agent (dual therapy). He is compliant with treatment all of the time. He is following a generally healthy diet. His breakfast blood glucose range is generally 110-130 mg/dl. ( ) An ACE inhibitor/angiotensin II receptor blocker is being taken. Eye exam is current.  Hypertension  This is a chronic problem. The current episode started more than 1 year ago. The problem has been resolved since onset. The problem is controlled. Pertinent negatives include no anxiety, headaches, palpitations, peripheral edema or shortness of breath. Risk factors for coronary artery disease include dyslipidemia, diabetes mellitus, male gender, family history and smoking/tobacco exposure. Past treatments include ACE inhibitors. The current treatment provides significant improvement. There is no history of kidney disease, CAD/MI, CVA, heart failure or a thyroid  problem. There is no history of sleep apnea.  Hyperlipidemia  This is a chronic problem. The current episode started more than 1 year ago. The problem is controlled. Recent lipid tests were reviewed and are normal. He has no history of hypothyroidism. Factors aggravating his hyperlipidemia include smoking and fatty foods. Pertinent negatives include no leg pain, myalgias or shortness of breath. Current antihyperlipidemic treatment includes statins. The current treatment provides moderate improvement of lipids. Risk factors for coronary artery disease include dyslipidemia, diabetes mellitus, family history, male sex and a sedentary lifestyle.      Review of Systems  Constitutional: Negative.   HENT: Negative.   Respiratory: Negative.  Negative for shortness of breath.   Cardiovascular: Negative.  Negative for palpitations.  Gastrointestinal: Negative.   Endocrine: Negative.   Genitourinary: Negative.   Musculoskeletal: Negative.  Negative for myalgias.  Neurological: Negative.  Negative for dizziness and headaches.  Hematological: Negative.   Psychiatric/Behavioral: Negative.  Negative for confusion.  All other systems reviewed and are negative.      Objective:   Physical Exam  Constitutional: He is oriented to person, place, and time. He appears well-developed and well-nourished. No distress.  HENT:  Head: Normocephalic.  Right Ear: External ear normal.  Left Ear: External ear normal.  Nose: Nose normal.  Mouth/Throat: Oropharynx is clear and moist.  Eyes: Pupils are equal, round, and reactive to light. Right eye exhibits no discharge. Left eye exhibits no discharge.  Neck: Normal range of motion. Neck supple. No thyromegaly present.  Cardiovascular: Normal rate, regular rhythm, normal heart sounds and intact distal pulses.   No murmur heard. Pulmonary/Chest: Effort normal and breath sounds normal. No respiratory distress. He has no wheezes.  Abdominal: Soft. Bowel sounds are  normal. He exhibits no distension. There is no tenderness.  Musculoskeletal: Normal range of motion. He exhibits no edema or tenderness.  Neurological: He is alert and oriented to person, place, and time. He has normal reflexes. No cranial nerve deficit.  Skin: Skin is warm and dry. No rash noted. No erythema.  Psychiatric: He has a normal mood and affect. His behavior is normal. Judgment and thought content normal.  Vitals reviewed.     BP 125/67   Pulse 70   Temp 97.9 F (36.6 C) (Oral)   Ht '5\' 7"'  (1.702 m)   Wt 183 lb 3.2 oz (83.1 kg)   BMI 28.69 kg/m      Assessment & Plan:  1. Vitamin D deficiency - CMP14+EGFR  2. Tobacco user - CMP14+EGFR  3. Hyperlipidemia - CMP14+EGFR - Lipid panel - atorvastatin (LIPITOR) 10 MG tablet; Take 1 tablet (10 mg total) by mouth daily.  Dispense: 90 tablet; Refill: 2  4. Type 2 diabetes mellitus with hyperglycemia, without long-term current use of insulin (HCC) - Bayer DCA Hb A1c Waived - Microalbumin / creatinine urine ratio - CMP14+EGFR - metFORMIN (GLUCOPHAGE) 500 MG tablet; TAKE (1) TABLET BY MOUTH TWICE DAILY WITH A MEAL.  Dispense: 180 tablet; Refill: 2 - glipiZIDE (GLUCOTROL) 5 MG tablet; Take 1 tablet (5 mg total) by mouth daily.  Dispense: 90 tablet; Refill: 2  5. Overweight (BMI 25.0-29.9) - CMP14+EGFR  6. Prostate cancer screening - CMP14+EGFR - PSA, total and free  7. Essential hypertension - lisinopril (PRINIVIL,ZESTRIL) 10 MG tablet; Take 1 tablet (10 mg total) by mouth daily.  Dispense: 90 tablet; Refill: 0   Continue all meds Labs pending Health Maintenance reviewed- Pneumonia 23 vaccine given Diet and exercise encouraged RTO 6 MONTHS   Evelina Dun, FNP

## 2015-07-27 NOTE — Patient Instructions (Signed)

## 2015-07-28 ENCOUNTER — Other Ambulatory Visit: Payer: Self-pay | Admitting: Family

## 2015-07-28 LAB — CMP14+EGFR
A/G RATIO: 1.7 (ref 1.2–2.2)
ALT: 6 IU/L (ref 0–44)
AST: 10 IU/L (ref 0–40)
Albumin: 4.3 g/dL (ref 3.6–4.8)
Alkaline Phosphatase: 70 IU/L (ref 39–117)
BILIRUBIN TOTAL: 0.5 mg/dL (ref 0.0–1.2)
BUN/Creatinine Ratio: 16 (ref 10–24)
BUN: 12 mg/dL (ref 8–27)
CALCIUM: 9.5 mg/dL (ref 8.6–10.2)
CHLORIDE: 102 mmol/L (ref 96–106)
CO2: 23 mmol/L (ref 18–29)
Creatinine, Ser: 0.74 mg/dL — ABNORMAL LOW (ref 0.76–1.27)
GFR calc Af Amer: 109 mL/min/{1.73_m2} (ref >59)
GFR calc non Af Amer: 94 mL/min/{1.73_m2} (ref >59)
GLUCOSE: 53 mg/dL — AB (ref 65–99)
Globulin, Total: 2.6 g/dL (ref 1.5–4.5)
POTASSIUM: 4.5 mmol/L (ref 3.5–5.2)
Sodium: 145 mmol/L — ABNORMAL HIGH (ref 134–144)
Total Protein: 6.9 g/dL (ref 6.0–8.5)

## 2015-07-28 LAB — LIPID PANEL
CHOLESTEROL TOTAL: 143 mg/dL (ref 100–199)
Chol/HDL Ratio: 2.6 ratio units (ref 0.0–5.0)
HDL: 54 mg/dL (ref >39)
LDL Calculated: 72 mg/dL (ref 0–99)
TRIGLYCERIDES: 84 mg/dL (ref 0–149)
VLDL CHOLESTEROL CAL: 17 mg/dL (ref 5–40)

## 2015-07-28 LAB — PSA, TOTAL AND FREE
PSA, Free Pct: 26 %
PSA, Free: 0.26 ng/mL
Prostate Specific Ag, Serum: 1 ng/mL (ref 0.0–4.0)

## 2015-09-11 ENCOUNTER — Other Ambulatory Visit (HOSPITAL_COMMUNITY)
Admission: AD | Admit: 2015-09-11 | Discharge: 2015-09-11 | Disposition: A | Discharge status: Home / Self Care | Payer: Commercial Managed Care - HMO | Source: Other Acute Inpatient Hospital | Attending: Urology | Admitting: Urology

## 2015-09-11 ENCOUNTER — Ambulatory Visit (INDEPENDENT_AMBULATORY_CARE_PROVIDER_SITE_OTHER): Payer: Commercial Managed Care - HMO | Admitting: Urology

## 2015-09-11 ENCOUNTER — Encounter (HOSPITAL_BASED_OUTPATIENT_CLINIC_OR_DEPARTMENT_OTHER): Payer: Self-pay | Admitting: *Deleted

## 2015-09-11 ENCOUNTER — Other Ambulatory Visit: Payer: Self-pay | Admitting: Urology

## 2015-09-11 DIAGNOSIS — R8299 Other abnormal findings in urine: Secondary | ICD-10-CM | POA: Diagnosis not present

## 2015-09-11 DIAGNOSIS — R310 Gross hematuria: Secondary | ICD-10-CM

## 2015-09-13 LAB — URINE CULTURE: Culture: NO GROWTH

## 2015-09-14 ENCOUNTER — Other Ambulatory Visit: Payer: Self-pay | Admitting: Urology

## 2015-09-15 ENCOUNTER — Encounter (HOSPITAL_BASED_OUTPATIENT_CLINIC_OR_DEPARTMENT_OTHER): Payer: Self-pay | Admitting: *Deleted

## 2015-09-15 NOTE — Progress Notes (Signed)
NPO AFTER MN WITH EXCEPTION BLACK LIQUIDS NO CREAM [place] [employer] UNTIL 0600.  ARRIVE AT 1045.  NEEDS ISTAT AND EKG.

## 2015-09-16 NOTE — H&P (Signed)
CC/HPI: I have blood in my urine.     Mr Fassnacht is back with a 2 day history of painless gross hematuria. I saw him in the spring for hematuria and he had a CT that showed small non-obstructive renal stones but no other significant findings were noted. He didn't return for cytoscopy. He went to the ER in June for a right flank pain and had another CT then and had a 44m proximal stone with obstruction but the symptoms have passed. I re-reviewed the CT films from April and there may be a filling defect in the left ureter at the level of the iliacs without obstruction. He has no associated signs or symptoms.     ALLERGIES: No Allergies    MEDICATIONS: Atorvastatin Calcium 10 MG Oral Tablet Oral  GlipiZIDE 5 MG Oral Tablet Oral  Lisinopril 10 MG Oral Tablet Oral  MetFORMIN HCl - 500 MG Oral Tablet Oral     GU PSH: None     PSH Notes: Total Knee Replacement   NON-GU PSH: Total Knee Replacement - 03/27/2015    GU PMH: Gross hematuria, Gross hematuria - 03/27/2015 Kidney Stone, Nephrolithiasis - 03/27/2015 Renal Cysts, Simple, Renal cyst, acquired, right - 03/27/2015 Other microscopic hematuria, Microscopic hematuria - 03/25/2015    NON-GU PMH: Personal history of other diseases of the circulatory system, History of hypertension - 03/27/2015 Personal history of other endocrine, nutritional and metabolic disease, History of diabetes mellitus - 03/27/2015 Unspecified atherosclerosis, Atherosclerosis - 03/27/2015 Encounter for general adult medical examination without abnormal findings, Encounter for preventive health examination    FAMILY HISTORY: Deceased - Runs In Family Diabetes - Runs In Family heart failure - Runs In Family Strokes - Runs In Family   SOCIAL HISTORY: None    Notes: Alcohol use, Retired, Widowed, Caffeine use, Current every day smoker   REVIEW OF SYSTEMS:    GU Review Male:   Patient reports erection problems. Patient denies frequent urination, hard to postpone  urination, burning/ pain with urination, get up at night to urinate, leakage of urine, stream starts and stops, trouble starting your stream, have to strain to urinate , and penile pain.  Gastrointestinal (Upper):   Patient denies nausea, vomiting, and indigestion/ heartburn.  Gastrointestinal (Lower):   Patient denies diarrhea and constipation.  Constitutional:   Patient denies fever, night sweats, weight loss, and fatigue.  Skin:   Patient denies skin rash/ lesion and itching.  Eyes:   Patient denies blurred vision and double vision.  Ears/ Nose/ Throat:   Patient denies sore throat and sinus problems.  Hematologic/Lymphatic:   Patient denies swollen glands and easy bruising.  Cardiovascular:   Patient denies leg swelling and chest pains.  Respiratory:   Patient denies cough and shortness of breath.  Endocrine:   Patient denies excessive thirst.  Musculoskeletal:   Patient denies back pain and joint pain.  Neurological:   Patient denies dizziness and headaches.  Psychologic:   Patient denies depression and anxiety.   VITAL SIGNS:      09/11/2015 11:24 AM  Weight 175 lb / 79.38 kg  Height 67 in / 170.18 cm  BP 152/73 mmHg  Pulse 59 /min  Temperature 98.2 F / 37 C  BMI 27.4 kg/m   MULTI-SYSTEM PHYSICAL EXAMINATION:    Constitutional: Well-nourished. No physical deformities. Normally developed. Good grooming.  Respiratory: No labored breathing, no use of accessory muscles. CTA  Cardiovascular: RRR without murmur.      PAST DATA REVIEWED:  Source Of History:  Patient  Urine Test Review:   Urinalysis  X-Ray Review: C.T. Stone Protocol: Reviewed Films. Reviewed Report. June 2017 C.T. Hematuria: Reviewed Films. Reviewed Report. April 2017    PROCEDURES:         Flexible Cystoscopy - 52000  Risks, benefits, and some of the potential complications of the procedure were discussed. Cipro '500mg'$  given for antibiotic prophylaxis.     Meatus:  Normal size. Normal location. Normal  condition.  Urethra:  No strictures.  External Sphincter:  Normal.  Verumontanum:  Normal.  Prostate:  Mild hyperplasia. Non-obstructing.  Bladder Neck:  Non-obstructing.  Ureteral Orifices:  Bloody urine from left orifice. Normal location. Normal size. Normal shape.  Bladder:  Mild trabeculation. No tumors. Normal mucosa. No stones.      He has bleeding from the left UO.   The procedure was well tolerated and there were no complications.         Urinalysis - 81003 Dipstick Dipstick Cont'd  Specimen: Voided Bilirubin: 1+  Color: Red Ketones: Neg  Appearance: Cloudy Blood: 3+  Specific Gravity: <= 1.005 Protein: 1+  pH: 7.5 Urobilinogen: 0.2  Glucose: Neg Nitrites: Neg    Leukocyte Esterase: 2+    ASSESSMENT:      ICD-10 Details  1 GU:   Gross hematuria - R31.0 Left, Worsening - from the left UA with possible ureteral filling defect on the CT in April.    PLAN:           Orders Labs Urine Cytology, Urine Culture and Sensitivity          Schedule Return Visit: ASAP - Schedule Surgery          Document Letter(s):  Created for Patient: Clinical Summary         Notes:   Mr. Belmar has bleeding from the left UO and on review of his CT from April there is a hazy area/possible non-obstructive filling defect at the level of the iliac that wasn't appreciated on the initial read. His subsequent CT in June without contrast showed no left hydro.   He needs cystoscopy with a left RTG, possible left ureteroscopy with biopsy/stone extraction and stenting. The risks of bleeding, infection, ureteral injury, need for a stent or secondary procedures, thrombotic events and anesthetic complications were reviewed.   I will try to get him set up for next week.

## 2015-09-17 ENCOUNTER — Ambulatory Visit (HOSPITAL_BASED_OUTPATIENT_CLINIC_OR_DEPARTMENT_OTHER): Payer: Commercial Managed Care - HMO | Admitting: Anesthesiology

## 2015-09-17 ENCOUNTER — Encounter (HOSPITAL_BASED_OUTPATIENT_CLINIC_OR_DEPARTMENT_OTHER)
Admission: RE | Disposition: A | Discharge status: Home / Self Care | Payer: Self-pay | Source: Ambulatory Visit | Attending: Urology

## 2015-09-17 ENCOUNTER — Ambulatory Visit (HOSPITAL_BASED_OUTPATIENT_CLINIC_OR_DEPARTMENT_OTHER)
Admission: RE | Admit: 2015-09-17 | Discharge: 2015-09-17 | Disposition: A | Discharge status: Home / Self Care | Payer: Commercial Managed Care - HMO | Source: Ambulatory Visit | Attending: Urology | Admitting: Urology

## 2015-09-17 ENCOUNTER — Encounter (HOSPITAL_BASED_OUTPATIENT_CLINIC_OR_DEPARTMENT_OTHER): Payer: Self-pay | Admitting: *Deleted

## 2015-09-17 DIAGNOSIS — I10 Essential (primary) hypertension: Secondary | ICD-10-CM | POA: Diagnosis not present

## 2015-09-17 DIAGNOSIS — E119 Type 2 diabetes mellitus without complications: Secondary | ICD-10-CM | POA: Insufficient documentation

## 2015-09-17 DIAGNOSIS — Z79899 Other long term (current) drug therapy: Secondary | ICD-10-CM | POA: Diagnosis not present

## 2015-09-17 DIAGNOSIS — N201 Calculus of ureter: Secondary | ICD-10-CM | POA: Insufficient documentation

## 2015-09-17 DIAGNOSIS — C662 Malignant neoplasm of left ureter: Secondary | ICD-10-CM | POA: Diagnosis not present

## 2015-09-17 DIAGNOSIS — F17200 Nicotine dependence, unspecified, uncomplicated: Secondary | ICD-10-CM | POA: Insufficient documentation

## 2015-09-17 DIAGNOSIS — R310 Gross hematuria: Secondary | ICD-10-CM | POA: Diagnosis present

## 2015-09-17 DIAGNOSIS — Z7984 Long term (current) use of oral hypoglycemic drugs: Secondary | ICD-10-CM | POA: Diagnosis not present

## 2015-09-17 DIAGNOSIS — N368 Other specified disorders of urethra: Secondary | ICD-10-CM | POA: Diagnosis not present

## 2015-09-17 DIAGNOSIS — Z96659 Presence of unspecified artificial knee joint: Secondary | ICD-10-CM | POA: Diagnosis not present

## 2015-09-17 DIAGNOSIS — Z87442 Personal history of urinary calculi: Secondary | ICD-10-CM | POA: Diagnosis not present

## 2015-09-17 HISTORY — DX: Calculus of kidney: N20.0

## 2015-09-17 HISTORY — PX: HOLMIUM LASER APPLICATION: SHX5852

## 2015-09-17 HISTORY — DX: Personal history of diseases of the skin and subcutaneous tissue: Z87.2

## 2015-09-17 HISTORY — PX: CYSTOSCOPY WITH RETROGRADE PYELOGRAM, URETEROSCOPY AND STENT PLACEMENT: SHX5789

## 2015-09-17 HISTORY — DX: Personal history of urinary calculi: Z87.442

## 2015-09-17 HISTORY — DX: Presence of spectacles and contact lenses: Z97.3

## 2015-09-17 HISTORY — DX: Hematuria, unspecified: R31.9

## 2015-09-17 HISTORY — PX: STONE EXTRACTION WITH BASKET: SHX5318

## 2015-09-17 HISTORY — DX: Type 2 diabetes mellitus without complications: E11.9

## 2015-09-17 HISTORY — DX: Cyst of kidney, acquired: N28.1

## 2015-09-17 LAB — POCT I-STAT, CHEM 8
BUN: 11 mg/dL (ref 6–20)
CALCIUM ION: 1.22 mmol/L (ref 1.15–1.40)
CHLORIDE: 104 mmol/L (ref 101–111)
CREATININE: 0.8 mg/dL (ref 0.61–1.24)
GLUCOSE: 99 mg/dL (ref 65–99)
HCT: 49 % (ref 39.0–52.0)
Hemoglobin: 16.7 g/dL (ref 13.0–17.0)
POTASSIUM: 4.5 mmol/L (ref 3.5–5.1)
Sodium: 141 mmol/L (ref 135–145)
TCO2: 24 mmol/L (ref 0–100)

## 2015-09-17 SURGERY — CYSTOURETEROSCOPY, WITH RETROGRADE PYELOGRAM AND STENT INSERTION
Anesthesia: General | Site: Renal | Laterality: Left

## 2015-09-17 MED ORDER — PHENAZOPYRIDINE HCL 200 MG PO TABS: 200.0000 mg | ORAL_TABLET | Freq: Three times a day (TID) | ORAL | 1 refills | Status: DC | PRN

## 2015-09-17 MED ORDER — BELLADONNA ALKALOIDS-OPIUM 16.2-60 MG RE SUPP
RECTAL | Status: AC
  Filled 2015-09-17: qty 1

## 2015-09-17 MED ORDER — CEFAZOLIN SODIUM-DEXTROSE 2-4 GM/100ML-% IV SOLN
2.0000 g | INTRAVENOUS | Status: AC
  Administered 2015-09-17: 2 g via INTRAVENOUS
  Filled 2015-09-17: qty 100

## 2015-09-17 MED ORDER — SODIUM CHLORIDE 0.9 % IV SOLN
250.0000 mL | INTRAVENOUS | Status: DC | PRN
  Filled 2015-09-17: qty 250

## 2015-09-17 MED ORDER — FENTANYL CITRATE (PF) 100 MCG/2ML IJ SOLN
INTRAMUSCULAR | Status: AC
  Filled 2015-09-17: qty 2

## 2015-09-17 MED ORDER — BELLADONNA ALKALOIDS-OPIUM 16.2-60 MG RE SUPP
RECTAL | Status: DC | PRN
  Administered 2015-09-17: 1 via RECTAL

## 2015-09-17 MED ORDER — ACETAMINOPHEN 650 MG RE SUPP
650.0000 mg | RECTAL | Status: DC | PRN
  Filled 2015-09-17: qty 1

## 2015-09-17 MED ORDER — SODIUM CHLORIDE 0.9% FLUSH
3.0000 mL | INTRAVENOUS | Status: DC | PRN
  Filled 2015-09-17: qty 3

## 2015-09-17 MED ORDER — ACETAMINOPHEN 325 MG PO TABS
650.0000 mg | ORAL_TABLET | ORAL | Status: DC | PRN
  Filled 2015-09-17: qty 2

## 2015-09-17 MED ORDER — OXYCODONE HCL 5 MG PO TABS
5.0000 mg | ORAL_TABLET | ORAL | Status: DC | PRN
  Filled 2015-09-17: qty 2

## 2015-09-17 MED ORDER — SODIUM CHLORIDE 0.9% FLUSH
3.0000 mL | Freq: Two times a day (BID) | INTRAVENOUS | Status: DC
  Filled 2015-09-17: qty 3

## 2015-09-17 MED ORDER — DEXAMETHASONE SODIUM PHOSPHATE 4 MG/ML IJ SOLN
INTRAMUSCULAR | Status: DC | PRN
  Administered 2015-09-17: 10 mg via INTRAVENOUS

## 2015-09-17 MED ORDER — PHENAZOPYRIDINE HCL 200 MG PO TABS
200.0000 mg | ORAL_TABLET | Freq: Three times a day (TID) | ORAL | Status: DC
  Administered 2015-09-17: 200 mg via ORAL
  Filled 2015-09-17: qty 1

## 2015-09-17 MED ORDER — FENTANYL CITRATE (PF) 100 MCG/2ML IJ SOLN
INTRAMUSCULAR | Status: DC | PRN
  Administered 2015-09-17 (×2): 25 ug via INTRAVENOUS

## 2015-09-17 MED ORDER — ONDANSETRON HCL 4 MG/2ML IJ SOLN
INTRAMUSCULAR | Status: DC | PRN
  Administered 2015-09-17: 4 mg via INTRAVENOUS

## 2015-09-17 MED ORDER — SODIUM CHLORIDE 0.9 % IR SOLN
Status: DC | PRN
  Administered 2015-09-17: 4000 mL

## 2015-09-17 MED ORDER — FENTANYL CITRATE (PF) 100 MCG/2ML IJ SOLN
25.0000 ug | INTRAMUSCULAR | Status: DC | PRN
  Filled 2015-09-17: qty 1

## 2015-09-17 MED ORDER — PHENAZOPYRIDINE HCL 100 MG PO TABS
ORAL_TABLET | ORAL | Status: AC
  Filled 2015-09-17: qty 2

## 2015-09-17 MED ORDER — CEFAZOLIN SODIUM-DEXTROSE 2-4 GM/100ML-% IV SOLN
INTRAVENOUS | Status: AC
  Filled 2015-09-17: qty 100

## 2015-09-17 MED ORDER — MIDAZOLAM HCL 2 MG/2ML IJ SOLN
INTRAMUSCULAR | Status: AC
  Filled 2015-09-17: qty 2

## 2015-09-17 MED ORDER — LIDOCAINE HCL 2 % EX GEL
CUTANEOUS | Status: DC | PRN
Start: 2015-09-17 — End: 2015-09-17
  Administered 2015-09-17: 1

## 2015-09-17 MED ORDER — PROPOFOL 10 MG/ML IV BOLUS
INTRAVENOUS | Status: DC | PRN
  Administered 2015-09-17: 200 mg via INTRAVENOUS

## 2015-09-17 MED ORDER — IOHEXOL 300 MG/ML  SOLN
INTRAMUSCULAR | Status: DC | PRN
  Administered 2015-09-17: 19 mL

## 2015-09-17 MED ORDER — HYDROCODONE-ACETAMINOPHEN 5-325 MG PO TABS: 1.0000 | ORAL_TABLET | Freq: Four times a day (QID) | ORAL | 0 refills | Status: DC | PRN

## 2015-09-17 MED ORDER — LACTATED RINGERS IV SOLN
INTRAVENOUS | Status: DC
  Administered 2015-09-17: 11:00:00 via INTRAVENOUS
  Filled 2015-09-17: qty 1000

## 2015-09-17 MED ORDER — MIDAZOLAM HCL 5 MG/5ML IJ SOLN
INTRAMUSCULAR | Status: DC | PRN
  Administered 2015-09-17: 2 mg via INTRAVENOUS

## 2015-09-17 MED ORDER — LIDOCAINE 2% (20 MG/ML) 5 ML SYRINGE
INTRAMUSCULAR | Status: DC | PRN
  Administered 2015-09-17: 100 mg via INTRAVENOUS

## 2015-09-17 SURGICAL SUPPLY — 37 items
BAG DRAIN URO-CYSTO SKYTR STRL (DRAIN) ×3 IMPLANT
BASKET LASER NITINOL 1.9FR (BASKET) IMPLANT
BASKET STONE 1.7 NGAGE (UROLOGICAL SUPPLIES) ×3 IMPLANT
BASKET ZERO TIP NITINOL 2.4FR (BASKET) ×3 IMPLANT
CATH URET 5FR 28IN CONE TIP (BALLOONS)
CATH URET 5FR 28IN OPEN ENDED (CATHETERS) ×3 IMPLANT
CATH URET 5FR 70CM CONE TIP (BALLOONS) IMPLANT
CLOTH BEACON ORANGE TIMEOUT ST (SAFETY) ×3 IMPLANT
ELECT REM PT RETURN 9FT ADLT (ELECTROSURGICAL)
ELECTRODE REM PT RTRN 9FT ADLT (ELECTROSURGICAL) IMPLANT
FIBER LASER FLEXIVA 1000 (UROLOGICAL SUPPLIES) IMPLANT
FIBER LASER FLEXIVA 365 (UROLOGICAL SUPPLIES) ×3 IMPLANT
FIBER LASER FLEXIVA 550 (UROLOGICAL SUPPLIES) IMPLANT
FIBER LASER TRAC TIP (UROLOGICAL SUPPLIES) IMPLANT
GLOVE BIO SURGEON STRL SZ 6.5 (GLOVE) ×3 IMPLANT
GLOVE BIOGEL PI IND STRL 6.5 (GLOVE) ×4 IMPLANT
GLOVE BIOGEL PI INDICATOR 6.5 (GLOVE) ×2
GLOVE SURG SS PI 8.0 STRL IVOR (GLOVE) ×3 IMPLANT
GOWN STRL REUS W/ TWL LRG LVL3 (GOWN DISPOSABLE) ×2 IMPLANT
GOWN STRL REUS W/ TWL XL LVL3 (GOWN DISPOSABLE) ×2 IMPLANT
GOWN STRL REUS W/TWL LRG LVL3 (GOWN DISPOSABLE) ×1
GOWN STRL REUS W/TWL XL LVL3 (GOWN DISPOSABLE) ×1
GUIDEWIRE 0.038 PTFE COATED (WIRE) IMPLANT
GUIDEWIRE ANG ZIPWIRE 038X150 (WIRE) IMPLANT
GUIDEWIRE STR DUAL SENSOR (WIRE) ×6 IMPLANT
IV NS IRRIG 3000ML ARTHROMATIC (IV SOLUTION) ×3 IMPLANT
KIT BALLIN UROMAX 15FX10 (LABEL) IMPLANT
KIT BALLN UROMAX 15FX4 (MISCELLANEOUS) IMPLANT
KIT BALLN UROMAX 26 75X4 (MISCELLANEOUS)
KIT ROOM TURNOVER WOR (KITS) ×3 IMPLANT
LEGGING LITHOTOMY PAIR STRL (DRAPES) ×3 IMPLANT
MANIFOLD NEPTUNE II (INSTRUMENTS) ×3 IMPLANT
PACK CYSTO (CUSTOM PROCEDURE TRAY) ×3 IMPLANT
SET HIGH PRES BAL DIL (LABEL)
SHEATH ACCESS URETERAL 38CM (SHEATH) ×3 IMPLANT
STENT URET 6FRX26 CONTOUR (STENTS) ×3 IMPLANT
TUBE CONNECTING 12X1/4 (SUCTIONS) ×3 IMPLANT

## 2015-09-17 NOTE — Anesthesia Preprocedure Evaluation (Addendum)
Anesthesia Evaluation  Patient identified by MRN, date of birth, ID band Patient awake    Reviewed: Allergy & Precautions, NPO status , Patient's Chart, lab work & pertinent test results  Airway Mallampati: II  TM Distance: >3 FB Neck ROM: Full    Dental no notable dental hx. (+) Dental Advisory Given, Poor Dentition, Missing,    Pulmonary Current Smoker,    Pulmonary exam normal breath sounds clear to auscultation       Cardiovascular hypertension, Pt. on medications Normal cardiovascular exam Rhythm:Regular Rate:Normal     Neuro/Psych negative neurological ROS  negative psych ROS   GI/Hepatic negative GI ROS, Neg liver ROS,   Endo/Other  negative endocrine ROSdiabetes, Type 2, Oral Hypoglycemic Agents  Renal/GU negative Renal ROS  negative genitourinary   Musculoskeletal negative musculoskeletal ROS (+)   Abdominal   Peds negative pediatric ROS (+)  Hematology negative hematology ROS (+)   Anesthesia Other Findings   Reproductive/Obstetrics negative OB ROS                           Anesthesia Physical Anesthesia Plan  ASA: III  Anesthesia Plan: General   Post-op Pain Management:    Induction: Intravenous  Airway Management Planned: LMA  Additional Equipment:   Intra-op Plan:   Post-operative Plan: Extubation in OR  Informed Consent: I have reviewed the patients History and Physical, chart, labs and discussed the procedure including the risks, benefits and alternatives for the proposed anesthesia with the patient or authorized representative who has indicated his/her understanding and acceptance.   Dental advisory given  Plan Discussed with:   Anesthesia Plan Comments:         Anesthesia Quick Evaluation

## 2015-09-17 NOTE — Transfer of Care (Signed)
Immediate Anesthesia Transfer of Care Note  Patient: Dalton Miller  Procedure(s) Performed: Procedure(s): CYSTOSCOPY WITH BILATERAL RETROGRADE PYELOGRAM, LEFT URETEROSCOPY WITH URETERAL BIOPSY AND  STENT PLACEMENT (Bilateral) HOLMIUM LASER APPLICATION (Left) STONE EXTRACTION WITH BASKET (Left)  Patient Location: PACU  Anesthesia Type:General  Level of Consciousness: awake, alert , oriented and patient cooperative  Airway & Oxygen Therapy: Patient Spontanous Breathing and Patient connected to nasal cannula oxygen  Post-op Assessment: Report given to RN and Post -op Vital signs reviewed and stable  Post vital signs: Reviewed and stable  Last Vitals:  Vitals:   09/17/15 1059  BP: (!) 178/76  Pulse: 64  Resp: 19  Temp: 36.9 C    Last Pain:  Vitals:   09/17/15 1059  TempSrc: Oral      Patients Stated Pain Goal: 7 (48/62/82 4175)  Complications: No apparent anesthesia complications

## 2015-09-17 NOTE — Anesthesia Procedure Notes (Signed)
Procedure Name: LMA Insertion Date/Time: 09/17/2015 11:41 AM Performed by: Wanita Chamberlain Pre-anesthesia Checklist: Patient identified, Timeout performed, Emergency Drugs available, Suction available and Patient being monitored Patient Re-evaluated:Patient Re-evaluated prior to inductionOxygen Delivery Method: Circle system utilized Preoxygenation: Pre-oxygenation with 100% oxygen Intubation Type: IV induction Ventilation: Mask ventilation without difficulty LMA: LMA inserted LMA Size: 4.0 Number of attempts: 1 Placement Confirmation: positive ETCO2 and breath sounds checked- equal and bilateral Tube secured with: Tape Dental Injury: Teeth and Oropharynx as per pre-operative assessment

## 2015-09-17 NOTE — Discharge Instructions (Addendum)
CYSTOSCOPY HOME CARE INSTRUCTIONS  Activity: Rest for the remainder of the day.  Do not drive or operate equipment today.  You may resume normal activities in one to two days as instructed by your physician.   Meals: Drink plenty of liquids and eat light foods such as gelatin or soup this evening.  You may return to a normal meal plan tomorrow.  Return to Work: You may return to work in one to two days or as instructed by your physician.  Special Instructions / Symptoms: Call your physician if any of these symptoms occur:   -persistent or heavy bleeding  -bleeding which continues after first few urination  -large blood clots that are difficult to pass  -urine stream diminishes or stops completely  -fever equal to or higher than 101 degrees Farenheit.  -cloudy urine with a strong, foul odor  -severe pain  Females should always wipe from front to back after elimination.  You may feel some burning pain when you urinate.  This should disappear with time.  Applying moist heat to the lower abdomen or a hot tub bath may help relieve the pain. \   Patient Signature:  ________________________________________________________ Nurse's Signature:  _______________________________________________________   Post Anesthesia Home Care Instructions  Activity: Get plenty of rest for the remainder of the day. A responsible adult should stay with you for 24 hours following the procedure.  For the next 24 hours, DO NOT: -Drive a car -Paediatric nurse -Drink alcoholic beverages -Take any medication unless instructed by your physician -Make any legal decisions or sign important papers.  Meals: Start with liquid foods such as gelatin or soup. Progress to regular foods as tolerated. Avoid greasy, spicy, heavy foods. If nausea and/or vomiting occur, drink only clear liquids until the nausea and/or vomiting subsides. Call your physician if vomiting continues.  Special Instructions/Symptoms: Your  throat may feel dry or sore from the anesthesia or the breathing tube placed in your throat during surgery. If this causes discomfort, gargle with warm salt water. The discomfort should disappear within 24 hours.  If you had a scopolamine patch placed behind your ear for the management of post- operative nausea and/or vomiting:  1. The medication in the patch is effective for 72 hours, after which it should be removed.  Wrap patch in a tissue and discard in the trash. Wash hands thoroughly with soap and water. 2. You may remove the patch earlier than 72 hours if you experience unpleasant side effects which may include dry mouth, dizziness or visual disturbances. 3. Avoid touching the patch. Wash your hands with soap and water after contact with the patch.

## 2015-09-17 NOTE — Brief Op Note (Signed)
09/17/2015  12:30 PM  PATIENT:  Dalton Miller  70 y.o. male  PRE-OPERATIVE DIAGNOSIS:  Left Ureteral Bleeding with history of right ureteral stone.   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 No stone on right.  PROCEDURE:  Procedure(s): CYSTOSCOPY WITH BILATERAL RETROGRADE PYELOGRAM, LEFT URETEROSCOPY WITH POSSIBLE BIOPSY AND POSSIBLE STENT PLACEMENT (Bilateral) HOLMIUM LASER APPLICATION (Left)  SURGEON:  Surgeon(s) and Role:    * Irine Seal, MD - Primary  PHYSICIAN ASSISTANT:   ASSISTANTS: none   ANESTHESIA:   general  EBL:  Total I/O In: 600 [I.V.:600] Out: 5 [Blood:5]  BLOOD ADMINISTERED:none  DRAINS: 6 x 26 left JJ stent   LOCAL MEDICATIONS USED:  LIDOCAINE  and Amount: 10 ml 2% jelly  SPECIMEN:  Source of Specimen:  left ureteral stone and tumor biopsy.  DISPOSITION OF SPECIMEN:  tumor fragment to path and stone to family.  COUNTS:  YES  TOURNIQUET:  * No tourniquets in log *  DICTATION: .Other Dictation: Dictation Number [id number]  PLAN OF CARE: Discharge to home after PACU  PATIENT DISPOSITION:  PACU - hemodynamically stable.   Delay start of Pharmacological VTE agent (>24hrs) due to surgical blood loss or risk of bleeding: not applicable

## 2015-09-17 NOTE — Anesthesia Postprocedure Evaluation (Signed)
Anesthesia Post Note  Patient: Dalton Miller  Procedure(s) Performed: Procedure(s) (LRB): CYSTOSCOPY WITH BILATERAL RETROGRADE PYELOGRAM, LEFT URETEROSCOPY WITH URETERAL BIOPSY AND  STENT PLACEMENT (Bilateral) HOLMIUM LASER APPLICATION (Left) STONE EXTRACTION WITH BASKET (Left)  Anesthesia Post Evaluation  Last Vitals:  Vitals:   09/17/15 1059  BP: (!) 178/76  Pulse: 64  Resp: 19  Temp: 36.9 C    Last Pain:  Vitals:   09/17/15 1059  TempSrc: Oral                 Daishawn Lauf F

## 2015-09-17 NOTE — Interval H&P Note (Signed)
History and Physical Interval Note:  09/17/2015 10:47 AM  Dalton Miller  has presented today for surgery, with the diagnosis of Left Ureteral Bleeding  The various methods of treatment have been discussed with the patient and family. After consideration of risks, benefits and other options for treatment, the patient has consented to  Procedure(s): CYSTOSCOPY WITH BILATERAL RETROGRADE PYELOGRAM, LEFT URETEROSCOPY WITH POSSIBLE BIOPSY AND POSSIBLE STENT PLACEMENT (Bilateral) as a surgical intervention .  The patient's history has been reviewed, patient examined, no change in status, stable for surgery.  I have reviewed the patient's chart and labs.  Questions were answered to the patient's satisfaction.     [name]

## 2015-09-18 ENCOUNTER — Encounter (HOSPITAL_BASED_OUTPATIENT_CLINIC_OR_DEPARTMENT_OTHER): Payer: Self-pay | Admitting: Urology

## 2015-09-18 LAB — GLUCOSE, CAPILLARY: Glucose-Capillary: 113 mg/dL — ABNORMAL HIGH (ref 65–99)

## 2015-09-18 NOTE — Op Note (Signed)
NAME:  CLEARENCE, VITUG NO.:  [id number]  MEDICAL RECORD NO.:  [id number]  LOCATION:                                 FACILITY:  PHYSICIAN:  Marshall Cork. Jeffie Pollock, M.D.    DATE OF BIRTH:  [birth year]  DATE OF PROCEDURE:  09/17/2015 DATE OF DISCHARGE:                              OPERATIVE REPORT   PROCEDURE: 1. Cystoscopy with bilateral retrograde pyelograms and interpretation. 2. Left ureteroscopy with removal of stone 3. Left ureteroscopic biopsy of mid ureteral tumor. 4. Insertion of left double-J stent.  PREOPERATIVE DIAGNOSES: 1. 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 2. History of right ureteral stone.  POSTOPERATIVE DIAGNOSES: 1. Left mid ureteral neoplasm. 2. Left ureteral stone. 3. No right ureteral stone was identified.  SURGEON:  Marshall Cork. Jeffie Pollock, M.D.  ANESTHESIA:  General.  SPECIMEN:  Stone, which was given to the patient's family and tumor fragments, which were sent to Pathology.  DRAINS:  A 6-French x 26 cm left double-J stent.  BLOOD LOSS:  Minimal.  COMPLICATIONS:  None.  INDICATIONS:  Mr. Derasmo is a 70 year old white male, I originally saw in April with hematuria.  He had a CT scan done at that time, that was read as unremarkable.  He did not return until recently.  He had been seen in June for right flank pain and had a CT scan that revealed a 4 mm right ureteral stone, which he was not sure he passed.  He presented last week with recurrent hematuria.  An office cystoscopy revealed bleeding from the left ureteral orifice, and on reinspection of the CT scan from April, there was a questionable filling defect in the left mid ureter at the level of the iliacs.  There was no significant obstruction on this or the subsequent CT in June, on the left side.  It was felt that ureteroscopy was indicated and bilateral retrograde pyelograms to ensure that the right ureteral stone was no  longer present.  The patient was taken to the operating room, where general anesthetic was induced.  He was given an antibiotic.  He was placed in the lithotomy position and fitted with PAS hose.  His perineum and genitalia were prepped with Betadine solution and he was draped in usual sterile fashion.  Cystoscopy was performed using a 23-French scope with 30-degree lens. Examination revealed a normal urethra.  The external sphincter was intact.  The prostatic urethra had bilobar hyperplasia with minimal obstruction.  Examination of the bladder revealed mild trabeculation. No tumors, stones, or inflammation were noted.  Ureteral orifices were unremarkable.  The left was no longer effluxing bloody urine.  The left ureteral orifice was cannulated with a 5-French open-end catheter and contrast was instilled.  This revealed approximately 2 cm filling defect in the mid ureter at the level of the iliacs with one goblet type appearance, consistent with a soft tissue neoplasm.  There was no significant proximal dilation or other filling defects.  Subsequently, a right retrograde pyelogram was performed with a 5-French open-end catheter and contrast.  The right retrograde pyelogram revealed no ureteral or intrarenal  filling defects or hydronephrosis to suggest a persistent stone.  A guidewire was then passed up the left ureteral orifice to the kidney, and the ureter was dilated to the level of the tumor with a 12-French introducer sheath inner core.  The 6.5-French semi-rigid dual-lumen ureteroscope was then passed alongside the wire and the tumor was identified in the mid ureter, it was at an area of slight kink in the ureter.  Biopsies were then obtained using both a Zero Tip and N-Gage basket, an adequate specimen was obtained.  At this point, a 365 micron laser fibers with the holmium set on 0.5 watts and 20 hertz was used to attempt to fulgurate the tumor.  However, it proved to  be at a difficult location and too large to treat adequately with the laser through the semi rigid scope.  I advanced the scope by the tumor to inspect the more proximal ureter and also identified a small stone, which was retrieved with an N-Gage basket.  It was approximately 4 cm.  At this point, the access sheath was reinserted over the wire. __________ access sheath was reinserted over the wire.  The introducer was removed and a second wire was passed to the kidney.  The access sheath was then reinserted over one of the wires, leaving the second as a safety wire to just the level of the tumor and the wire and inner core were then removed from the sheath.  I then used a dual-lumen flexible ureteroscope to further inspect the area of the tumor and I attempted more laser, but was not able to come bear sufficiently on the tumor to destroy much of the tissue, and I was concerned about ureteral perforation with the location of the tumor at the angle with particularly the surrounding blood vessels.  At this point, I advanced the scope more proximally.  I was not able to get it all the way into the kidney due to some valving of the proximal ureter, but there have been no filling defects on the initial retrograde.  There was possibly one small papillary lesion more proximally, but it was not obviously a tumor.  At this point, the ureteroscope was removed along with the sheath and the cystoscope was reinserted over the safety wire, and a 6-French 26 cm Contour double-J stent with no string was advanced to the kidney under fluoroscopic guidance.  The wire was removed leaving good coil in the kidney and good coil in the bladder.  The bladder was then drained and refilled.  Inspection revealed minimal bleeding from the ureteral orifice along the stent.  The bladder was then left partially full and the scope was removed.  The urethra was instilled with 10 mL of 2% lidocaine jelly, which was  secured with a gauze wrap, and a B and O suppository was placed.  The patient was taken down from lithotomy position.  His anesthetic was reversed.  He was moved to recovery room in stable condition.  There were no complications.     Marshall Cork. Jeffie Pollock, M.D.   ______________________________ Marshall Cork. Jeffie Pollock, M.D.    JJW/MEDQ  D:  09/17/2015  T:  09/18/2015  Job:  [id number]

## 2015-09-18 NOTE — Op Note (Deleted)
  The note originally documented on this encounter has been moved the the encounter in which it belongs.  

## 2015-09-25 ENCOUNTER — Ambulatory Visit (INDEPENDENT_AMBULATORY_CARE_PROVIDER_SITE_OTHER): Payer: Commercial Managed Care - HMO | Admitting: Urology

## 2015-09-25 DIAGNOSIS — C662 Malignant neoplasm of left ureter: Secondary | ICD-10-CM

## 2015-10-07 ENCOUNTER — Ambulatory Visit (INDEPENDENT_AMBULATORY_CARE_PROVIDER_SITE_OTHER): Payer: Commercial Managed Care - HMO | Admitting: Urology

## 2015-10-07 DIAGNOSIS — C669 Malignant neoplasm of unspecified ureter: Secondary | ICD-10-CM

## 2015-10-19 ENCOUNTER — Encounter (HOSPITAL_COMMUNITY): Admission: RE | Admit: 2015-10-19 | Payer: Commercial Managed Care - HMO | Source: Ambulatory Visit

## 2015-10-22 ENCOUNTER — Other Ambulatory Visit: Payer: Self-pay | Admitting: Radiology

## 2015-10-22 DIAGNOSIS — C662 Malignant neoplasm of left ureter: Secondary | ICD-10-CM

## 2015-10-22 NOTE — Patient Instructions (Signed)
Dalton Miller  10/22/2015     '@PREFPERIOPPHARMACY'$ @   Your procedure is scheduled on  10/28/2015   Report to [facility] at  88  A.M.  Call this number if you have problems the morning of surgery:  [phone]   Remember:  Do not eat food or drink liquids after midnight.  Take these medicines the morning of surgery with A SIP OF WATER  Norco, lisinopril, pyridium.   Do not wear jewelry, make-up or nail polish.  Do not wear lotions, powders, or perfumes, or deoderant.  Do not shave 48 hours prior to surgery.  Men may shave face and neck.  Do not bring valuables to the hospital.  [facility] is not responsible for any belongings or valuables.  Contacts, dentures or bridgework may not be worn into surgery.  Leave your suitcase in the car.  After surgery it may be brought to your room.  For patients admitted to the hospital, discharge time will be determined by your treatment team.  Patients discharged the day of surgery will not be allowed to drive home.   Name and phone number of your driver:   family Special instructions:  none  Please read over the following fact sheets that you were given. Anesthesia Post-op Instructions and Care and Recovery After Surgery       Ureteral Stent Implantation Ureteral stent implantation is the implantation of a soft plastic tube with multiple holes into the tube that drains urine from your kidney to your bladder (ureter). The stent helps drain your kidney when there is a blockage of the flow of urine in your ureter. The stent has a coil on each end to keep it from falling out. One end stays in the kidney. The other end stays in the bladder. It is most often taken out after any blockage has been removed or your ureter has healed. Short-term stents have a string attached to make removal quite easy. Removal of a short-term stent can be done in your health care provider's office or by you at home. Long-term stents need to be  changed every few months. LET [facility] CARE PROVIDER KNOW ABOUT:  Any allergies you have.  All medicines you are taking, including vitamins, herbs, eye drops, creams, and over-the-counter medicines.  Previous problems you or members of your family have had with the use of anesthetics.  Any blood disorders you have.  Previous surgeries you have had.  Medical conditions you have. RISKS AND COMPLICATIONS Generally, ureteral stent implantation is a safe procedure. However, as with any procedure, complications can occur. Possible complications include:  Movement of the stent away from where it was originally placed (migration). This may affect the ability of the stent to properly drain your kidney. If migration of the stent occurs, the stent may need to be replaced or repositioned.  Perforation of the ureter.  Infection. BEFORE THE PROCEDURE  You may be asked to wash your genital area with sterile soap the morning of your procedure.  You may be given an oral antibiotic which you should take with a sip of water as prescribed by your health care provider.  You may be asked to not eat or drink for 8 hours before the surgery. PROCEDURE  First you will be given an anesthetic so you do not feel pain during the procedure.  Your health care provider will insert a special lighted instrument called a cystoscope into your  bladder. This allows your health care provider to see the opening to your ureter.  A thin wire is carefully threaded into your bladder and up the ureter. The stent is inserted over the wire and the wire is then removed.  Your bladder will be emptied of urine. AFTER THE PROCEDURE You will be taken to a recovery room until it is okay for you to go home.   This information is not intended to replace advice given to you by your health care provider. Make sure you discuss any questions you have with your health care provider.   Document Released: 12/18/1999 Document  Revised: 12/25/2012 Document Reviewed: 07/04/2014 Elsevier Interactive Patient Education 2016 [employer] Ureteral Stent Implantation, Care After Refer to this sheet in the next few weeks. These instructions provide you with information on caring for yourself after your procedure. Your health care provider may also give you more specific instructions. Your treatment has been planned according to current medical practices, but problems sometimes occur. Call your health care provider if you have any problems or questions after your procedure. WHAT TO EXPECT AFTER THE PROCEDURE You should be back to normal activity within 48 hours after the procedure. Nausea and vomiting may occur and are commonly the result of anesthesia. It is common to experience sharp pain in the back or lower abdomen and penis with voiding. This is caused by movement of the ends of the stent with the act of urinating.It usually goes away within minutes after you have stopped urinating. HOME CARE INSTRUCTIONS Make sure to drink plenty of fluids. You may have small amounts of bleeding, causing your urine to be red. This is normal. Certain movements may trigger pain or a feeling that you need to urinate. You may be given medicines to prevent infection or bladder spasms. Be sure to take all medicines as directed. Only take over-the-counter or prescription medicines for pain, discomfort, or fever as directed by your health care provider. Do not take aspirin, as this can make bleeding worse. Your stent will be left in until the blockage is resolved. This may take 2 weeks or longer, depending on the reason for stent implantation. You may have an X-ray exam to make sure your ureter is open and that the stent has not moved out of position (migrated). The stent can be removed by your health care provider in the office. Medicines may be given for comfort while the stent is being removed. Be sure to keep all follow-up appointments so your health  care provider can check that you are healing properly. SEEK MEDICAL CARE IF:  You experience increasing pain.  Your pain medicine is not working. SEEK IMMEDIATE MEDICAL CARE IF:  Your urine is dark red or has blood clots.  You are leaking urine (incontinent).  You have a fever, chills, feeling sick to your stomach (nausea), or vomiting.  Your pain is not relieved by pain medicine.  The end of the stent comes out of the urethra.  You are unable to urinate.   This information is not intended to replace advice given to you by your health care provider. Make sure you discuss any questions you have with your health care provider.   Document Released: 08/22/2012 Document Revised: 12/25/2012 Document Reviewed: 07/04/2014 Elsevier Interactive Patient Education [employer].  Cystoscopy Cystoscopy is a procedure that is used to help your caregiver diagnose and sometimes treat conditions that affect your lower urinary tract. Your lower urinary tract includes your bladder and the tube  through which urine passes from your bladder out of your body (urethra). Cystoscopy is performed with a thin, tube-shaped instrument (cystoscope). The cystoscope has lenses and a light at the end so that your caregiver can see inside your bladder. The cystoscope is inserted at the entrance of your urethra. Your caregiver guides it through your urethra and into your bladder. There are two main types of cystoscopy:  Flexible cystoscopy (with a flexible cystoscope).  Rigid cystoscopy (with a rigid cystoscope). Cystoscopy may be recommended for many conditions, including:  Urinary tract infections.  Blood in your urine (hematuria).  Loss of bladder control (urinary incontinence) or overactive bladder.  Unusual cells found in a urine sample.  Urinary blockage.  Painful urination. Cystoscopy may also be done to remove a sample of your tissue to be checked under a microscope (biopsy). It may also be done  to remove or destroy bladder stones. LET YOUR CAREGIVER KNOW ABOUT:  Allergies to food or medicine.  Medicines taken, including vitamins, herbs, eyedrops, over-the-counter medicines, and creams.  Use of steroids (by mouth or creams).  Previous problems with anesthetics or numbing medicines.  History of bleeding problems or blood clots.  Previous surgery.  Other health problems, including diabetes and kidney problems.  Possibility of pregnancy, if this applies. PROCEDURE The area around the opening to your urethra will be cleaned. A medicine to numb your urethra (local anesthetic) is used. If a tissue sample or stone is removed during the procedure, you may be given a medicine to make you sleep (general anesthetic). Your caregiver will gently insert the tip of the cystoscope into your urethra. The cystoscope will be slowly glided through your urethra and into your bladder. Sterile fluid will flow through the cystoscope and into your bladder. The fluid will expand and stretch your bladder. This gives your caregiver a better view of your bladder walls. The procedure lasts about 15-20 minutes. AFTER THE PROCEDURE If a local anesthetic is used, you will be allowed to go home as soon as you are ready. If a general anesthetic is used, you will be taken to a recovery area until you are stable. You may have temporary bleeding and burning on urination.   This information is not intended to replace advice given to you by your health care provider. Make sure you discuss any questions you have with your health care provider.   Document Released: 12/18/1999 Document Revised: 01/10/2014 Document Reviewed: 06/13/2011 Elsevier Interactive Patient Education 2016 [place]. Cystoscopy, Care After Refer to this sheet in the next few weeks. These instructions provide you with information on caring for yourself after your procedure. Your caregiver may also give you more specific instructions. Your  treatment has been planned according to current medical practices, but problems sometimes occur. Call your caregiver if you have any problems or questions after your procedure. HOME CARE INSTRUCTIONS  Things you can do to ease any discomfort after your procedure include:  Drinking enough water and fluids to keep your urine clear or pale yellow.  Taking a warm bath to relieve any burning feelings. SEEK IMMEDIATE MEDICAL CARE IF:   You have an increase in blood in your urine.  You notice blood clots in your urine.  You have difficulty passing urine.  You have the chills.  You have abdominal pain.  You have a fever or persistent symptoms for more than 2-3 days.  You have a fever and your symptoms suddenly get worse. MAKE SURE YOU:   Understand these instructions.  Will watch your condition.  Will get help right away if you are not doing well or get worse.   This information is not intended to replace advice given to you by your health care provider. Make sure you discuss any questions you have with your health care provider.   Document Released: 07/09/2004 Document Revised: 01/10/2014 Document Reviewed: 06/13/2011 Elsevier Interactive Patient Education 2016 Elsevier Inc. PATIENT INSTRUCTIONS POST-ANESTHESIA  IMMEDIATELY FOLLOWING SURGERY:  Do not drive or operate machinery for the first twenty four hours after surgery.  Do not make any important decisions for twenty four hours after surgery or while taking narcotic pain medications or sedatives.  If you develop intractable nausea and vomiting or a severe headache please notify your doctor immediately.  FOLLOW-UP:  Please make an appointment with your surgeon as instructed. You do not need to follow up with anesthesia unless specifically instructed to do so.  WOUND CARE INSTRUCTIONS (if applicable):  Keep a dry clean dressing on the anesthesia/puncture wound site if there is drainage.  Once the wound has quit draining you may  leave it open to air.  Generally you should leave the bandage intact for twenty four hours unless there is drainage.  If the epidural site drains for more than 36-48 hours please call the anesthesia department.  QUESTIONS?:  Please feel free to call your physician or the hospital operator if you have any questions, and they will be happy to assist you.

## 2015-10-23 ENCOUNTER — Encounter (HOSPITAL_COMMUNITY)
Admission: RE | Admit: 2015-10-23 | Discharge: 2015-10-23 | Disposition: A | Discharge status: Home / Self Care | Payer: Commercial Managed Care - HMO | Source: Ambulatory Visit | Attending: Urology | Admitting: Urology

## 2015-10-23 DIAGNOSIS — C669 Malignant neoplasm of unspecified ureter: Secondary | ICD-10-CM | POA: Diagnosis not present

## 2015-10-23 LAB — CBC WITH DIFFERENTIAL/PLATELET
BASOS ABS: 0.1 10*3/uL (ref 0.0–0.1)
Basophils Relative: 1 %
EOS ABS: 0.1 10*3/uL (ref 0.0–0.7)
Eosinophils Relative: 2 %
HCT: 46.5 % (ref 39.0–52.0)
HEMOGLOBIN: 15.7 g/dL (ref 13.0–17.0)
LYMPHS ABS: 3.1 10*3/uL (ref 0.7–4.0)
LYMPHS PCT: 34 %
MCH: 32.2 pg (ref 26.0–34.0)
MCHC: 33.8 g/dL (ref 30.0–36.0)
MCV: 95.5 fL (ref 78.0–100.0)
Monocytes Absolute: 0.5 10*3/uL (ref 0.1–1.0)
Monocytes Relative: 6 %
NEUTROS PCT: 57 %
Neutro Abs: 5.1 10*3/uL (ref 1.7–7.7)
Platelets: 172 10*3/uL (ref 150–400)
RBC: 4.87 MIL/uL (ref 4.22–5.81)
RDW: 13.5 % (ref 11.5–15.5)
WBC: 8.9 10*3/uL (ref 4.0–10.5)

## 2015-10-23 LAB — BASIC METABOLIC PANEL
ANION GAP: 5 (ref 5–15)
BUN: 13 mg/dL (ref 6–20)
CHLORIDE: 106 mmol/L (ref 101–111)
CO2: 27 mmol/L (ref 22–32)
Calcium: 9.3 mg/dL (ref 8.9–10.3)
Creatinine, Ser: 0.86 mg/dL (ref 0.61–1.24)
GFR calc Af Amer: >60 mL/min (ref >60)
GFR calc non Af Amer: >60 mL/min (ref >60)
Glucose, Bld: 170 mg/dL — ABNORMAL HIGH (ref 65–99)
POTASSIUM: 4.5 mmol/L (ref 3.5–5.1)
SODIUM: 138 mmol/L (ref 135–145)

## 2015-10-28 ENCOUNTER — Ambulatory Visit (HOSPITAL_COMMUNITY): Payer: Commercial Managed Care - HMO | Admitting: Anesthesiology

## 2015-10-28 ENCOUNTER — Ambulatory Visit (HOSPITAL_COMMUNITY)
Admission: RE | Admit: 2015-10-28 | Discharge: 2015-10-28 | Disposition: A | Discharge status: Home / Self Care | Payer: Commercial Managed Care - HMO | Source: Ambulatory Visit | Attending: Urology | Admitting: Urology

## 2015-10-28 ENCOUNTER — Encounter (HOSPITAL_COMMUNITY): Payer: Self-pay | Admitting: *Deleted

## 2015-10-28 ENCOUNTER — Ambulatory Visit (HOSPITAL_COMMUNITY): Payer: Commercial Managed Care - HMO

## 2015-10-28 ENCOUNTER — Encounter (HOSPITAL_COMMUNITY)
Admission: RE | Disposition: A | Discharge status: Home / Self Care | Payer: Self-pay | Source: Ambulatory Visit | Attending: Urology

## 2015-10-28 DIAGNOSIS — E119 Type 2 diabetes mellitus without complications: Secondary | ICD-10-CM | POA: Diagnosis not present

## 2015-10-28 DIAGNOSIS — Z8249 Family history of ischemic heart disease and other diseases of the circulatory system: Secondary | ICD-10-CM | POA: Diagnosis not present

## 2015-10-28 DIAGNOSIS — I10 Essential (primary) hypertension: Secondary | ICD-10-CM | POA: Insufficient documentation

## 2015-10-28 DIAGNOSIS — C662 Malignant neoplasm of left ureter: Secondary | ICD-10-CM

## 2015-10-28 DIAGNOSIS — F17210 Nicotine dependence, cigarettes, uncomplicated: Secondary | ICD-10-CM | POA: Diagnosis not present

## 2015-10-28 DIAGNOSIS — Z466 Encounter for fitting and adjustment of urinary device: Secondary | ICD-10-CM | POA: Diagnosis not present

## 2015-10-28 DIAGNOSIS — Z7984 Long term (current) use of oral hypoglycemic drugs: Secondary | ICD-10-CM | POA: Insufficient documentation

## 2015-10-28 DIAGNOSIS — Z833 Family history of diabetes mellitus: Secondary | ICD-10-CM | POA: Insufficient documentation

## 2015-10-28 DIAGNOSIS — D4122 Neoplasm of uncertain behavior of left ureter: Secondary | ICD-10-CM | POA: Diagnosis not present

## 2015-10-28 DIAGNOSIS — N529 Male erectile dysfunction, unspecified: Secondary | ICD-10-CM | POA: Diagnosis not present

## 2015-10-28 DIAGNOSIS — Z7982 Long term (current) use of aspirin: Secondary | ICD-10-CM | POA: Insufficient documentation

## 2015-10-28 DIAGNOSIS — N1339 Other hydronephrosis: Secondary | ICD-10-CM | POA: Diagnosis not present

## 2015-10-28 DIAGNOSIS — N1330 Unspecified hydronephrosis: Secondary | ICD-10-CM

## 2015-10-28 DIAGNOSIS — Z96651 Presence of right artificial knee joint: Secondary | ICD-10-CM | POA: Insufficient documentation

## 2015-10-28 DIAGNOSIS — Z87442 Personal history of urinary calculi: Secondary | ICD-10-CM | POA: Diagnosis not present

## 2015-10-28 DIAGNOSIS — E785 Hyperlipidemia, unspecified: Secondary | ICD-10-CM | POA: Diagnosis not present

## 2015-10-28 HISTORY — PX: URETEROSCOPY: SHX842

## 2015-10-28 HISTORY — PX: CYSTOSCOPY W/ RETROGRADES: SHX1426

## 2015-10-28 HISTORY — PX: CYSTOSCOPY W/ URETERAL STENT PLACEMENT: SHX1429

## 2015-10-28 LAB — GLUCOSE, CAPILLARY
GLUCOSE-CAPILLARY: 100 mg/dL — AB (ref 65–99)
GLUCOSE-CAPILLARY: 46 mg/dL — AB (ref 65–99)
GLUCOSE-CAPILLARY: 105 mg/dL — AB (ref 65–99)

## 2015-10-28 SURGERY — CYSTOSCOPY, WITH RETROGRADE PYELOGRAM
Anesthesia: General | Laterality: Left

## 2015-10-28 MED ORDER — PROPOFOL 10 MG/ML IV BOLUS
INTRAVENOUS | Status: AC
  Filled 2015-10-28: qty 20

## 2015-10-28 MED ORDER — MIDAZOLAM HCL 2 MG/2ML IJ SOLN
INTRAMUSCULAR | Status: AC
  Filled 2015-10-28: qty 2

## 2015-10-28 MED ORDER — EPHEDRINE SULFATE 50 MG/ML IJ SOLN
INTRAMUSCULAR | Status: DC | PRN
  Administered 2015-10-28 (×2): 5 mg via INTRAVENOUS

## 2015-10-28 MED ORDER — GLYCOPYRROLATE 0.2 MG/ML IJ SOLN
0.2000 mg | Freq: Once | INTRAMUSCULAR | Status: AC | PRN
  Administered 2015-10-28: 0.2 mg via INTRAVENOUS

## 2015-10-28 MED ORDER — GLYCOPYRROLATE 0.2 MG/ML IJ SOLN
INTRAMUSCULAR | Status: AC
  Filled 2015-10-28: qty 1

## 2015-10-28 MED ORDER — PHENAZOPYRIDINE HCL 200 MG PO TABS: 200.0000 mg | ORAL_TABLET | Freq: Three times a day (TID) | ORAL | 1 refills | Status: DC | PRN

## 2015-10-28 MED ORDER — STERILE WATER FOR IRRIGATION IR SOLN
Status: DC | PRN
  Administered 2015-10-28: 500 mL
  Administered 2015-10-28: 3000 mL

## 2015-10-28 MED ORDER — FENTANYL CITRATE (PF) 100 MCG/2ML IJ SOLN
INTRAMUSCULAR | Status: AC
  Filled 2015-10-28: qty 2

## 2015-10-28 MED ORDER — HYDROMORPHONE HCL 1 MG/ML IJ SOLN: 0.2500 mg | INTRAMUSCULAR | Status: DC | PRN

## 2015-10-28 MED ORDER — DEXTROSE 5 % IV SOLN
2.0000 g | INTRAVENOUS | Status: AC
  Administered 2015-10-28: 2 g via INTRAVENOUS
  Filled 2015-10-28: qty 2

## 2015-10-28 MED ORDER — LACTATED RINGERS IV SOLN
INTRAVENOUS | Status: DC
  Administered 2015-10-28: 1000 mL via INTRAVENOUS

## 2015-10-28 MED ORDER — DEXTROSE 50 % IV SOLN
INTRAVENOUS | Status: AC
  Filled 2015-10-28: qty 50

## 2015-10-28 MED ORDER — LIDOCAINE HCL (CARDIAC) 10 MG/ML IV SOLN
INTRAVENOUS | Status: DC | PRN
  Administered 2015-10-28: 30 mg via INTRAVENOUS

## 2015-10-28 MED ORDER — HYDROCODONE-ACETAMINOPHEN 5-325 MG PO TABS: 1.0000 | ORAL_TABLET | Freq: Four times a day (QID) | ORAL | 0 refills | Status: DC | PRN

## 2015-10-28 MED ORDER — ONDANSETRON HCL 4 MG/2ML IJ SOLN
4.0000 mg | Freq: Once | INTRAMUSCULAR | Status: AC
  Administered 2015-10-28: 4 mg via INTRAVENOUS

## 2015-10-28 MED ORDER — ONDANSETRON HCL 4 MG/2ML IJ SOLN
INTRAMUSCULAR | Status: AC
  Filled 2015-10-28: qty 2

## 2015-10-28 MED ORDER — FENTANYL CITRATE (PF) 100 MCG/2ML IJ SOLN
25.0000 ug | INTRAMUSCULAR | Status: DC | PRN
  Administered 2015-10-28: 25 ug via INTRAVENOUS

## 2015-10-28 MED ORDER — DIATRIZOATE MEGLUMINE 18 % UR SOLN
URETHRAL | Status: DC | PRN
  Administered 2015-10-28: 10 mL

## 2015-10-28 MED ORDER — MIDAZOLAM HCL 2 MG/2ML IJ SOLN
1.0000 mg | INTRAMUSCULAR | Status: DC | PRN
  Administered 2015-10-28: 2 mg via INTRAVENOUS

## 2015-10-28 MED ORDER — FENTANYL CITRATE (PF) 250 MCG/5ML IJ SOLN
INTRAMUSCULAR | Status: AC
  Filled 2015-10-28: qty 5

## 2015-10-28 MED ORDER — DEXTROSE 50 % IV SOLN
12.5000 g | Freq: Once | INTRAVENOUS | Status: AC
  Administered 2015-10-28: 12.5 g via INTRAVENOUS

## 2015-10-28 MED ORDER — DIATRIZOATE MEGLUMINE 30 % UR SOLN
URETHRAL | Status: AC
  Filled 2015-10-28: qty 300

## 2015-10-28 MED ORDER — FENTANYL CITRATE (PF) 100 MCG/2ML IJ SOLN
INTRAMUSCULAR | Status: DC | PRN
  Administered 2015-10-28: 50 ug via INTRAVENOUS
  Administered 2015-10-28: 25 ug via INTRAVENOUS
  Administered 2015-10-28: 50 ug via INTRAVENOUS

## 2015-10-28 MED ORDER — PROPOFOL 10 MG/ML IV BOLUS
INTRAVENOUS | Status: DC | PRN
  Administered 2015-10-28: 150 mg via INTRAVENOUS

## 2015-10-28 SURGICAL SUPPLY — 21 items
BAG DRAIN URO TABLE W/ADPT NS (DRAPE) ×3 IMPLANT
BAG HAMPER (MISCELLANEOUS) ×3 IMPLANT
CATH INTERMIT  6FR 70CM (CATHETERS) ×3 IMPLANT
CLOTH BEACON ORANGE TIMEOUT ST (SAFETY) ×3 IMPLANT
DECANTER SPIKE VIAL GLASS SM (MISCELLANEOUS) ×3 IMPLANT
GLOVE BIO SURGEON STRL SZ7.5 (GLOVE) ×3 IMPLANT
GLOVE BIOGEL PI IND STRL 7.0 (GLOVE) ×4 IMPLANT
GLOVE BIOGEL PI INDICATOR 7.0 (GLOVE) ×2
GLOVE ECLIPSE 6.5 STRL STRAW (GLOVE) ×3 IMPLANT
GOWN STRL REUS W/ TWL LRG LVL3 (GOWN DISPOSABLE) ×2 IMPLANT
GOWN STRL REUS W/ TWL XL LVL3 (GOWN DISPOSABLE) ×2 IMPLANT
GOWN STRL REUS W/TWL LRG LVL3 (GOWN DISPOSABLE) ×1
GOWN STRL REUS W/TWL XL LVL3 (GOWN DISPOSABLE) ×1
GUIDEWIRE STR DUAL SENSOR (WIRE) ×3 IMPLANT
GUIDEWIRE STR ZIPWIRE 035X150 (MISCELLANEOUS) ×3 IMPLANT
KIT ROOM TURNOVER AP CYSTO (KITS) ×3 IMPLANT
MANIFOLD NEPTUNE II (INSTRUMENTS) ×3 IMPLANT
PACK CYSTO (CUSTOM PROCEDURE TRAY) ×3 IMPLANT
PAD ARMBOARD 7.5X6 YLW CONV (MISCELLANEOUS) ×3 IMPLANT
STENT URET 6FRX26 CONTOUR (STENTS) ×3 IMPLANT
WATER STERILE IRR 3000ML UROMA (IV SOLUTION) ×6 IMPLANT

## 2015-10-28 NOTE — Anesthesia Postprocedure Evaluation (Signed)
Anesthesia Post Note  Patient: Dalton Miller  Procedure(s) Performed: Procedure(s) (LRB): CYSTOSCOPY WITH LEFT RETROGRADE PYELOGRAM (Left) CYSTOSCOPY WITH LEFT URETERAL STENT EXCHANGE (Left) DIAGNOSTIC LEFT URETEROSCOPY  Patient location during evaluation: PACU Anesthesia Type: General Level of consciousness: awake and alert and oriented Pain management: pain level controlled Vital Signs Assessment: post-procedure vital signs reviewed and stable Respiratory status: spontaneous breathing Cardiovascular status: blood pressure returned to baseline Postop Assessment: no signs of nausea or vomiting Anesthetic complications: no    Last Vitals:  Vitals:   10/28/15 1015 10/28/15 1020  BP: 123/75 124/74  Pulse:    Resp: 18 18  Temp:      Last Pain:  Vitals:   10/28/15 0833  TempSrc: Oral                 Maythe Deramo

## 2015-10-28 NOTE — Transfer of Care (Signed)
Immediate Anesthesia Transfer of Care Note  Patient: Dalton Miller  Procedure(s) Performed: Procedure(s): CYSTOSCOPY WITH LEFT RETROGRADE PYELOGRAM (Left) CYSTOSCOPY WITH LEFT URETERAL STENT EXCHANGE (Left) DIAGNOSTIC LEFT URETEROSCOPY  Patient Location: PACU  Anesthesia Type:General  Level of Consciousness: awake and alert   Airway & Oxygen Therapy: Patient Spontanous Breathing  Post-op Assessment: Report given to RN  Post vital signs: Reviewed and stable  Last Vitals:  Vitals:   10/28/15 1015 10/28/15 1020  BP: 123/75 124/74  Pulse:    Resp: 18 18  Temp:      Last Pain:  Vitals:   10/28/15 0833  TempSrc: Oral      Patients Stated Pain Goal: 8 (87/57/97 2820)  Complications: No apparent anesthesia complications

## 2015-10-28 NOTE — H&P (Signed)
Urology Admission H&P  Chief Complaint: left ureteral tumor  History of Present Illness: [name] is a 70yo with a hx of gross hematuria and a left ureteral tumor. 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 Acquired renal cyst of right kidney   . Erectile dysfunction   . Hematuria    left ureteral bleeding  . History of cellulitis    2012 left wrist  . History of kidney stones   . Hyperlipidemia   . Hypertension   . Nephrolithiasis    bilateral non-obstructive per ct 07-02-2015  . Type 2 diabetes mellitus ([place])   . Wears glasses    Past Surgical History:  Procedure Laterality Date  . CARPAL TUNNEL RELEASE Right 1990  approx  . COLONOSCOPY  03/04/2010  . CYSTOSCOPY WITH RETROGRADE PYELOGRAM, URETEROSCOPY AND STENT PLACEMENT Bilateral 09/17/2015   Procedure: CYSTOSCOPY WITH BILATERAL RETROGRADE PYELOGRAM, LEFT URETEROSCOPY WITH URETERAL BIOPSY AND  STENT PLACEMENT;  Surgeon: Irine Seal, MD;  Location: [facility];  Service: Urology;  Laterality: Bilateral;  . HOLMIUM LASER APPLICATION Left 05/20/6158   Procedure: HOLMIUM LASER APPLICATION;  Surgeon: Irine Seal, MD;  Location: [facility];  Service: Urology;  Laterality: Left;  . KNEE ARTHROSCOPY Right 2008  . STONE EXTRACTION WITH BASKET Left 09/17/2015   Procedure: STONE EXTRACTION WITH BASKET;  Surgeon: Irine Seal, MD;  Location: [facility];  Service: Urology;  Laterality: Left;  . TOTAL KNEE ARTHROPLASTY Right 2009    Home Medications:  Prescriptions Prior to Admission  Medication Sig Dispense Refill Last Dose  . atorvastatin (LIPITOR) 10 MG tablet Take 1 tablet (10 mg total) by mouth daily. (Patient taking differently: Take 10 mg by mouth every morning. ) 90 tablet 2 10/28/2015 at 0600  . glipiZIDE (GLUCOTROL) 5 MG tablet Take 1 tablet (5 mg total) by mouth daily. (Patient taking differently: Take 5 mg by mouth daily before breakfast. ) 90 tablet 2 10/28/2015  at 0600  . glucose blood test strip Use as instructed (Patient taking differently: 1 each by Other route once a week. Use as instructed) 180 each 12 Taking  . lisinopril (PRINIVIL,ZESTRIL) 10 MG tablet Take 1 tablet (10 mg total) by mouth daily. (Patient taking differently: Take 10 mg by mouth every morning. ) 90 tablet 0 10/28/2015 at 0600  . metFORMIN (GLUCOPHAGE) 500 MG tablet TAKE (1) TABLET BY MOUTH TWICE DAILY WITH A MEAL. 180 tablet 2 10/28/2015 at 0600  . aspirin EC 81 MG tablet Take 1 tablet (81 mg total) by mouth daily. 90 tablet 1 More than a month at Unknown time  . HYDROcodone-acetaminophen (NORCO) 5-325 MG tablet Take 1 tablet by mouth every 6 (six) hours as needed for moderate pain. (Patient not taking: Reported on 10/14/2015) 20 tablet 0 Not Taking at Unknown time  . phenazopyridine (PYRIDIUM) 200 MG tablet Take 1 tablet (200 mg total) by mouth 3 (three) times daily as needed for pain. (Patient not taking: Reported on 10/14/2015) 15 tablet 1 Not Taking at Unknown time   Allergies: No Known Allergies  Family History  Problem Relation Age of Onset  . Diabetes Mother   . Heart disease Mother   . Diabetes Father    Social History:  reports that he has been smoking Cigarettes.  He has a 118.00 pack-year smoking history. He has never used smokeless tobacco. He reports that he does not drink alcohol or use drugs.  Review of Systems  All other  systems reviewed and are negative.   Physical Exam:  Vital signs in last 24 hours: Temp:  [98.1 F (36.7 C)] 98.1 F (36.7 C) (10/25 0833) Pulse Rate:  [62] 62 (10/25 0833) Resp:  [18-25] 19 (10/25 0940) BP: (106-148)/(63-83) 108/68 (10/25 0940) SpO2:  [97 %-100 %] 99 % (10/25 0940) Weight:  [80.3 kg (177 lb)] 80.3 kg (177 lb) (10/25 0947) Physical Exam  Constitutional: He is oriented to person, place, and time. He appears well-developed and well-nourished.  HENT:  Head: Normocephalic and atraumatic.  Eyes: EOM are normal. Pupils are  equal, round, and reactive to light.  Neck: Normal range of motion. No thyromegaly present.  Cardiovascular: Normal rate and regular rhythm.   Respiratory: Effort normal. No respiratory distress.  GI: Soft. He exhibits no distension.  Musculoskeletal: Normal range of motion. He exhibits no edema.  Neurological: He is alert and oriented to person, place, and time.  Skin: Skin is warm and dry.  Psychiatric: He has a normal mood and affect. His behavior is normal. Judgment and thought content normal.    Laboratory Data:  Results for orders placed or performed during the hospital encounter of 10/28/15 (from the past 24 hour(s))  Glucose, capillary     Status: Abnormal   Collection Time: 10/28/15  8:40 AM  Result Value Ref Range   Glucose-Capillary 46 (L) 65 - 99 mg/dL  Glucose, capillary     Status: Abnormal   Collection Time: 10/28/15  9:16 AM  Result Value Ref Range   Glucose-Capillary 100 (H) 65 - 99 mg/dL   No results found for this or any previous visit (from the past 240 hour(s)). Creatinine:  Recent Labs  10/23/15 1342  CREATININE 0.86   Baseline Creatinine: 0.86  Impression/Assessment:  70yo with left ureteral TCC  Plan:  The risks/benefits/alternatives to left ureteral tumor resection was exp,lained to the patient and he understands and wishes to proceed with surgery  Nicolette Bang 10/28/2015, 10:19 AM

## 2015-10-28 NOTE — Discharge Instructions (Signed)
Cystoscopy, Care After Refer to this sheet in the next few weeks. These instructions provide you with information on caring for yourself after your procedure. Your caregiver may also give you more specific instructions. Your treatment has been planned according to current medical practices, but problems sometimes occur. Call your caregiver if you have any problems or questions after your procedure. HOME CARE INSTRUCTIONS  Things you can do to ease any discomfort after your procedure include:  Drinking enough water and fluids to keep your urine clear or pale yellow.  Taking a warm bath to relieve any burning feelings. SEEK IMMEDIATE MEDICAL CARE IF:   You have an increase in blood in your urine.  You notice blood clots in your urine.  You have difficulty passing urine.  You have the chills.  You have abdominal pain.  You have a fever or persistent symptoms for more than 2-3 days.  You have a fever and your symptoms suddenly get worse. MAKE SURE YOU:   Understand these instructions.  Will watch your condition.  Will get help right away if you are not doing well or get worse.   This information is not intended to replace advice given to you by your health care provider. Make sure you discuss any questions you have with your health care provider.   Document Released: 07/09/2004 Document Revised: 01/10/2014 Document Reviewed: 06/13/2011 Elsevier Interactive Patient Education 2016 [employer] Inc. Ureteral Stent Implantation, Care After Refer to this sheet in the next few weeks. These instructions provide you with information on caring for yourself after your procedure. Your health care provider may also give you more specific instructions. Your treatment has been planned according to current medical practices, but problems sometimes occur. Call your health care provider if you have any problems or questions after your procedure. WHAT TO EXPECT AFTER THE PROCEDURE You should be back to  normal activity within 48 hours after the procedure. Nausea and vomiting may occur and are commonly the result of anesthesia. It is common to experience sharp pain in the back or lower abdomen and penis with voiding. This is caused by movement of the ends of the stent with the act of urinating.It usually goes away within minutes after you have stopped urinating. HOME CARE INSTRUCTIONS Make sure to drink plenty of fluids. You may have small amounts of bleeding, causing your urine to be red. This is normal. Certain movements may trigger pain or a feeling that you need to urinate. You may be given medicines to prevent infection or bladder spasms. Be sure to take all medicines as directed. Only take over-the-counter or prescription medicines for pain, discomfort, or fever as directed by your health care provider. Do not take aspirin, as this can make bleeding worse. Your stent will be left in until the blockage is resolved. This may take 2 weeks or longer, depending on the reason for stent implantation. You may have an X-ray exam to make sure your ureter is open and that the stent has not moved out of position (migrated). The stent can be removed by your health care provider in the office. Medicines may be given for comfort while the stent is being removed. Be sure to keep all follow-up appointments so your health care provider can check that you are healing properly. SEEK MEDICAL CARE IF:  You experience increasing pain.  Your pain medicine is not working. SEEK IMMEDIATE MEDICAL CARE IF:  Your urine is dark red or has blood clots.  You are leaking urine (incontinent).  You have a fever, chills, feeling sick to your stomach (nausea), or vomiting.  Your pain is not relieved by pain medicine.  The end of the stent comes out of the urethra.  You are unable to urinate.   This information is not intended to replace advice given to you by your health care provider. Make sure you discuss any questions  you have with your health care provider.   Document Released: 08/22/2012 Document Revised: 12/25/2012 Document Reviewed: 07/04/2014 Elsevier Interactive Patient Education [employer].

## 2015-10-28 NOTE — Anesthesia Procedure Notes (Signed)
Procedure Name: LMA Insertion Date/Time: 10/28/2015 10:37 AM Performed by: Tressie Stalker E Pre-anesthesia Checklist: Patient identified, Patient being monitored, Emergency Drugs available, Timeout performed and Suction available Patient Re-evaluated:Patient Re-evaluated prior to inductionOxygen Delivery Method: Circle System Utilized Preoxygenation: Pre-oxygenation with 100% oxygen Intubation Type: IV induction Ventilation: Mask ventilation without difficulty LMA: LMA inserted LMA Size: 4.0 Number of attempts: 1 Placement Confirmation: positive ETCO2 and breath sounds checked- equal and bilateral

## 2015-10-28 NOTE — Anesthesia Preprocedure Evaluation (Signed)
Anesthesia Evaluation  Patient identified by MRN, date of birth, ID band Patient awake    Reviewed: Allergy & Precautions, NPO status , Patient's Chart, lab work & pertinent test results  Airway Mallampati: II  TM Distance: >3 FB Neck ROM: Full    Dental no notable dental hx. (+) Dental Advisory Given, Poor Dentition, Missing, Edentulous Upper, Loose,    Pulmonary Current Smoker,    Pulmonary exam normal breath sounds clear to auscultation       Cardiovascular hypertension, Pt. on medications Normal cardiovascular exam Rhythm:Regular Rate:Normal     Neuro/Psych negative neurological ROS  negative psych ROS   GI/Hepatic negative GI ROS, Neg liver ROS,   Endo/Other  negative endocrine ROSdiabetes, Type 2, Oral Hypoglycemic Agents  Renal/GU Renal diseasenegative Renal ROS  negative genitourinary   Musculoskeletal negative musculoskeletal ROS (+)   Abdominal   Peds negative pediatric ROS (+)  Hematology negative hematology ROS (+)   Anesthesia Other Findings   Reproductive/Obstetrics negative OB ROS                             Anesthesia Physical Anesthesia Plan  ASA: III  Anesthesia Plan: General   Post-op Pain Management:    Induction: Intravenous  Airway Management Planned: LMA  Additional Equipment:   Intra-op Plan:   Post-operative Plan: Extubation in OR  Informed Consent: I have reviewed the patients History and Physical, chart, labs and discussed the procedure including the risks, benefits and alternatives for the proposed anesthesia with the patient or authorized representative who has indicated his/her understanding and acceptance.     Plan Discussed with:   Anesthesia Plan Comments:         Anesthesia Quick Evaluation

## 2015-10-28 NOTE — Op Note (Signed)
Preoperative diagnosis: Left ureteral tumor  Postoperative diagnosis: Same  Procedure: 1 cystoscopy 2.  left retrograde pyelography 3.  Intraoperative fluoroscopy, under one hour, with interpretation 4.  Left diagnostic ureteroscopy 5.  Left 6x26 JJ ureteral stent exchange  Attending: Nicolette Bang  Anesthesia: General  Estimated blood loss: None  Drains: left 6x26 JJ ureteral stent  Specimens: none  Antibiotics: rocephin  Findings: left 4 cm mid ureteral tumor with severe proximal hydronephrosis.  Indications: Patient is a 70 year old male with a history of  Left ureteral TCC After discussing treatment options, he decided proceed with right diagnostic ureteroscopy and possible resection. Left mid ureteral stricture  Procedure her in detail: The patient was brought to the operating room and a brief timeout was done to ensure correct patient, correct procedure, correct site.  General anesthesia was administered patient was placed in dorsal lithotomy position.  Her genitalia was then prepped and draped in usual sterile fashion.  A rigid 25 French cystoscope was passed in the urethra and the bladder.  Bladder was inspected free masses or lesions.  the ureteral orifices were in the normal orthotopic locations. Using a grasper the stent was brought to the urethral meatus. We then advanced a zipwire through the stent and up to the renal pelvis. The stent was then removed.  a 6 french ureteral catheter was then instilled into the left ureter orifice.  a gentle retrograde was obtained and findings noted above.  we then removed the cystoscope and cannulated the left ureteral orifice with a ureteral resectoscope. We were unable to advance the scope to the mass due to a stricture in the mid ureter.  we then performed ureteroscopy with the semirgid ureteroscope up to the level of the UPJ. The length of the tumor was over 4cm. We then removed the scope and placed a 6x26 JJ ureteral stet over the  zipwire. The wire was removed and good coiling was noted in the renal pelvic under fluoroscopy and the bladder under direct vision.  the bladder was then drained and this concluded the procedure which was well tolerated by patient.  Complications: None  Condition: Stable, extubated, transferred to PACU  Plan: Pt is to followup in 2 weeks and we will schedule for nephroureterectomy

## 2015-10-29 ENCOUNTER — Other Ambulatory Visit: Payer: Self-pay | Admitting: Urology

## 2015-10-29 MED FILL — Diatrizoate Meglumine Urethral Soln 30%: URETHRAL | Qty: 100 | Status: AC

## 2015-11-02 ENCOUNTER — Encounter (HOSPITAL_COMMUNITY): Payer: Self-pay | Admitting: Urology

## 2015-11-06 ENCOUNTER — Telehealth: Payer: Self-pay | Admitting: Family

## 2015-11-06 DIAGNOSIS — E782 Mixed hyperlipidemia: Secondary | ICD-10-CM

## 2015-11-06 DIAGNOSIS — I10 Essential (primary) hypertension: Secondary | ICD-10-CM

## 2015-11-06 DIAGNOSIS — E1165 Type 2 diabetes mellitus with hyperglycemia: Secondary | ICD-10-CM

## 2015-11-09 MED ORDER — GLUCOSE BLOOD VI STRP: ORAL_STRIP | 2 refills | Status: DC

## 2015-11-09 MED ORDER — LISINOPRIL 10 MG PO TABS: 10.0000 mg | ORAL_TABLET | Freq: Every morning | ORAL | 1 refills | Status: DC

## 2015-11-09 MED ORDER — ATORVASTATIN CALCIUM 10 MG PO TABS: 10.0000 mg | ORAL_TABLET | Freq: Every morning | ORAL | 1 refills | Status: DC

## 2015-11-09 MED ORDER — GLIPIZIDE 5 MG PO TABS: 5.0000 mg | ORAL_TABLET | Freq: Every day | ORAL | 1 refills | Status: DC

## 2015-11-09 MED ORDER — METFORMIN HCL 500 MG PO TABS: ORAL_TABLET | ORAL | 2 refills | Status: DC

## 2015-11-09 NOTE — Telephone Encounter (Signed)
Medications refilled

## 2015-11-11 ENCOUNTER — Ambulatory Visit: Payer: Commercial Managed Care - HMO | Admitting: Urology

## 2015-11-30 ENCOUNTER — Encounter (HOSPITAL_COMMUNITY)
Admission: RE | Admit: 2015-11-30 | Discharge: 2015-11-30 | Disposition: A | Discharge status: Home / Self Care | Payer: Commercial Managed Care - HMO | Source: Ambulatory Visit | Attending: Urology | Admitting: Urology

## 2015-11-30 ENCOUNTER — Encounter (HOSPITAL_COMMUNITY): Payer: Self-pay

## 2015-11-30 DIAGNOSIS — E119 Type 2 diabetes mellitus without complications: Secondary | ICD-10-CM | POA: Diagnosis not present

## 2015-11-30 DIAGNOSIS — Z01818 Encounter for other preprocedural examination: Secondary | ICD-10-CM | POA: Diagnosis not present

## 2015-11-30 HISTORY — DX: Cardiac murmur, unspecified: R01.1

## 2015-11-30 HISTORY — DX: Malignant (primary) neoplasm, unspecified: C80.1

## 2015-11-30 LAB — BASIC METABOLIC PANEL WITH GFR
Anion gap: 7 (ref 5–15)
BUN: 14 mg/dL (ref 6–20)
CO2: 24 mmol/L (ref 22–32)
Calcium: 9.4 mg/dL (ref 8.9–10.3)
Chloride: 109 mmol/L (ref 101–111)
Creatinine, Ser: 0.86 mg/dL (ref 0.61–1.24)
GFR calc Af Amer: >60 mL/min
GFR calc non Af Amer: >60 mL/min
Glucose, Bld: 80 mg/dL (ref 65–99)
Potassium: 4.6 mmol/L (ref 3.5–5.1)
Sodium: 140 mmol/L (ref 135–145)

## 2015-11-30 LAB — CBC
HCT: 47.8 % (ref 39.0–52.0)
Hemoglobin: 16.3 g/dL (ref 13.0–17.0)
MCH: 32.3 pg (ref 26.0–34.0)
MCHC: 34.1 g/dL (ref 30.0–36.0)
MCV: 94.8 fL (ref 78.0–100.0)
Platelets: 164 K/uL (ref 150–400)
RBC: 5.04 MIL/uL (ref 4.22–5.81)
RDW: 13.7 % (ref 11.5–15.5)
WBC: 9.1 K/uL (ref 4.0–10.5)

## 2015-11-30 LAB — ABO/RH: ABO/RH(D): O POS

## 2015-11-30 LAB — GLUCOSE, CAPILLARY
GLUCOSE-CAPILLARY: 69 mg/dL (ref 65–99)
Glucose-Capillary: 72 mg/dL (ref 65–99)

## 2015-11-30 NOTE — Patient Instructions (Signed)
Dalton Miller  11/30/2015   Your procedure is scheduled on: Friday December 04, 2015  Report to [facility] Main  Entrance take [place]  elevators to 3rd floor to  Dalton Miller at 9:45 AM.  Call this number if you have problems the morning of surgery [phone]   Remember: ONLY 1 PERSON MAY GO WITH YOU TO SHORT STAY TO GET  READY MORNING OF Dalton Miller.  Do not eat food or drink liquids :After Midnight.     Take these medicines the morning of surgery with A SIP OF WATER: NONE How to Manage Your Diabetes Before and After Surgery  Why is it important to control my blood sugar before and after surgery? . Improving blood sugar levels before and after surgery helps healing and can limit problems. . A way of improving blood sugar control is eating a healthy diet by: o  Eating less sugar and carbohydrates o  Increasing activity/exercise o  Talking with your doctor about reaching your blood sugar goals . High blood sugars (greater than 180 mg/dL) can raise your risk of infections and slow your recovery, so you will need to focus on controlling your diabetes during the weeks before surgery. . Make sure that the doctor who takes care of your diabetes knows about your planned surgery including the date and location.  How do I manage my blood sugar before surgery? . Check your blood sugar at least 4 times a day, starting 2 days before surgery, to make sure that the level is not too high or low. o Check your blood sugar the morning of your surgery when you wake up and every 2 hours until you get to the Short Stay unit. . If your blood sugar is less than 70 mg/dL, you will need to treat for low blood sugar: o Do not take insulin. o Treat a low blood sugar (less than 70 mg/dL) with  cup of clear juice (cranberry or apple), 4 glucose tablets, OR glucose gel. o Recheck blood sugar in 15 minutes after treatment (to make sure it is greater than 70 mg/dL). If your blood sugar  is not greater than 70 mg/dL on recheck, call [phone] for further instructions. . Report your blood sugar to the short stay nurse when you get to Short Stay.  . If you are admitted to the hospital after surgery: o Your blood sugar will be checked by the staff and you will probably be given insulin after surgery (instead of oral diabetes medicines) to make sure you have good blood sugar levels. o The goal for blood sugar control after surgery is 80-180 mg/dL.   WHAT DO I DO ABOUT MY DIABETES MEDICATION?  Dalton Miller Kitchen Do not take oral diabetes medicines (pills) the morning of surgery.   Patient Signature:  Date:   Nurse Signature:  Date:   Reviewed and Endorsed by [facility] Patient Education Committee, August 2015  DO NOT TAKE ANY DIABETIC MEDICATIONS DAY OF YOUR SURGERY                               You may not have any metal on your body including hair pins and              piercings  Do not wear jewelry, lotions, powders or colognes, deodorant  Men may shave face and neck.   Do not bring valuables to the hospital. Dalton Miller.  Contacts, dentures or bridgework may not be worn into surgery.  Leave suitcase in the car. After surgery it may be brought to your room.    Special Instructions: FOLLOW SURGEON'S INSTRUCTIONS IN REGARDS TO BOWEL PREPARATION PRIOR TO SURGICAL PROCEDURE DATE; NO SMOKING 24 HOURS PRIOR TO SURGICAL PROCEDURE DATE          _____________________________________________________________________             [facility] - Preparing for Surgery Before surgery, you can play an important role.  Because skin is not sterile, your skin needs to be as free of germs as possible.  You can reduce the number of germs on your skin by washing with CHG (chlorahexidine gluconate) soap before surgery.  CHG is an antiseptic cleaner which kills germs and bonds with the skin to continue killing germs even after  washing. Please DO NOT use if you have an allergy to CHG or antibacterial soaps.  If your skin becomes reddened/irritated stop using the CHG and inform your nurse when you arrive at Short Stay. Do not shave (including legs and underarms) for at least 48 hours prior to the first CHG shower.  You may shave your face/neck. Please follow these instructions carefully:  1.  Shower with CHG Soap the night before surgery and the  morning of Surgery.  2.  If you choose to wash your hair, wash your hair first as usual with your  normal  shampoo.  3.  After you shampoo, rinse your hair and body thoroughly to remove the  shampoo.                           4.  Use CHG as you would any other liquid soap.  You can apply chg directly  to the skin and wash                       Gently with a scrungie or clean washcloth.  5.  Apply the CHG Soap to your body ONLY FROM THE NECK DOWN.   Do not use on face/ open                           Wound or open sores. Avoid contact with eyes, ears mouth and genitals (private parts).                       Wash face,  Genitals (private parts) with your normal soap.             6.  Wash thoroughly, paying special attention to the area where your surgery  will be performed.  7.  Thoroughly rinse your body with warm water from the neck down.  8.  DO NOT shower/wash with your normal soap after using and rinsing off  the CHG Soap.                9.  Pat yourself dry with a clean towel.            10.  Wear clean pajamas.            11.  Place clean sheets on your bed the night of  your first shower and do not  sleep with pets. Day of Surgery : Do not apply any lotions/deodorants the morning of surgery.  Please wear clean clothes to the hospital/surgery center.  FAILURE TO FOLLOW THESE INSTRUCTIONS MAY RESULT IN THE CANCELLATION OF YOUR SURGERY PATIENT SIGNATURE_________________________________  NURSE  SIGNATURE__________________________________  ________________________________________________________________________

## 2015-12-02 LAB — HEMOGLOBIN A1C
HEMOGLOBIN A1C: 6.2 % — AB (ref 4.8–5.6)
MEAN PLASMA GLUCOSE: 131 mg/dL

## 2015-12-04 ENCOUNTER — Inpatient Hospital Stay (HOSPITAL_COMMUNITY): Payer: Commercial Managed Care - HMO | Admitting: Certified Registered"

## 2015-12-04 ENCOUNTER — Inpatient Hospital Stay (HOSPITAL_COMMUNITY)
Admission: RE | Admit: 2015-12-04 | Discharge: 2015-12-06 | DRG: 658 | Disposition: A | Discharge status: Home / Self Care | Payer: Commercial Managed Care - HMO | Source: Ambulatory Visit | Attending: Urology | Admitting: Urology

## 2015-12-04 ENCOUNTER — Encounter (HOSPITAL_COMMUNITY)
Admission: RE | Disposition: A | Discharge status: Home / Self Care | Payer: Self-pay | Source: Ambulatory Visit | Attending: Urology

## 2015-12-04 ENCOUNTER — Encounter (HOSPITAL_COMMUNITY): Payer: Self-pay | Admitting: *Deleted

## 2015-12-04 DIAGNOSIS — I10 Essential (primary) hypertension: Secondary | ICD-10-CM | POA: Diagnosis not present

## 2015-12-04 DIAGNOSIS — Z7984 Long term (current) use of oral hypoglycemic drugs: Secondary | ICD-10-CM | POA: Diagnosis not present

## 2015-12-04 DIAGNOSIS — K660 Peritoneal adhesions (postprocedural) (postinfection): Secondary | ICD-10-CM | POA: Diagnosis present

## 2015-12-04 DIAGNOSIS — E785 Hyperlipidemia, unspecified: Secondary | ICD-10-CM | POA: Diagnosis not present

## 2015-12-04 DIAGNOSIS — F17210 Nicotine dependence, cigarettes, uncomplicated: Secondary | ICD-10-CM | POA: Diagnosis present

## 2015-12-04 DIAGNOSIS — Z79899 Other long term (current) drug therapy: Secondary | ICD-10-CM

## 2015-12-04 DIAGNOSIS — N281 Cyst of kidney, acquired: Secondary | ICD-10-CM | POA: Diagnosis present

## 2015-12-04 DIAGNOSIS — E119 Type 2 diabetes mellitus without complications: Secondary | ICD-10-CM | POA: Diagnosis not present

## 2015-12-04 DIAGNOSIS — C662 Malignant neoplasm of left ureter: Principal | ICD-10-CM | POA: Diagnosis present

## 2015-12-04 DIAGNOSIS — Z96651 Presence of right artificial knee joint: Secondary | ICD-10-CM | POA: Diagnosis not present

## 2015-12-04 DIAGNOSIS — N2889 Other specified disorders of kidney and ureter: Secondary | ICD-10-CM

## 2015-12-04 DIAGNOSIS — N529 Male erectile dysfunction, unspecified: Secondary | ICD-10-CM | POA: Diagnosis not present

## 2015-12-04 HISTORY — PX: ROBOT ASSITED LAPAROSCOPIC NEPHROURETERECTOMY: SHX6077

## 2015-12-04 LAB — TYPE AND SCREEN
ABO/RH(D): O POS
Antibody Screen: NEGATIVE

## 2015-12-04 LAB — GLUCOSE, CAPILLARY
GLUCOSE-CAPILLARY: 138 mg/dL — AB (ref 65–99)
Glucose-Capillary: 86 mg/dL (ref 65–99)
Glucose-Capillary: 127 mg/dL — ABNORMAL HIGH (ref 65–99)
Glucose-Capillary: 157 mg/dL — ABNORMAL HIGH (ref 65–99)
Glucose-Capillary: 183 mg/dL — ABNORMAL HIGH (ref 65–99)

## 2015-12-04 LAB — HEMOGLOBIN AND HEMATOCRIT, BLOOD
HCT: 46.1 % (ref 39.0–52.0)
Hemoglobin: 15.6 g/dL (ref 13.0–17.0)

## 2015-12-04 SURGERY — NEPHROURETERECTOMY, ROBOT-ASSISTED, LAPAROSCOPIC
Anesthesia: General | Laterality: Left

## 2015-12-04 MED ORDER — SUGAMMADEX SODIUM 200 MG/2ML IV SOLN
INTRAVENOUS | Status: DC | PRN
  Administered 2015-12-04: 200 mg via INTRAVENOUS

## 2015-12-04 MED ORDER — SODIUM CHLORIDE 0.9 % IR SOLN
Status: DC | PRN
  Administered 2015-12-04: 1000 mL via INTRAVESICAL

## 2015-12-04 MED ORDER — MIDAZOLAM HCL 5 MG/5ML IJ SOLN
INTRAMUSCULAR | Status: DC | PRN
  Administered 2015-12-04: 2 mg via INTRAVENOUS

## 2015-12-04 MED ORDER — CEFAZOLIN SODIUM-DEXTROSE 2-4 GM/100ML-% IV SOLN
2.0000 g | INTRAVENOUS | Status: AC
  Administered 2015-12-04: 2 g via INTRAVENOUS
  Filled 2015-12-04: qty 100

## 2015-12-04 MED ORDER — ROCURONIUM BROMIDE 10 MG/ML (PF) SYRINGE
PREFILLED_SYRINGE | INTRAVENOUS | Status: DC | PRN
  Administered 2015-12-04 (×2): 10 mg via INTRAVENOUS
  Administered 2015-12-04: 50 mg via INTRAVENOUS
  Administered 2015-12-04: 10 mg via INTRAVENOUS

## 2015-12-04 MED ORDER — HYDROMORPHONE HCL 2 MG/ML IJ SOLN
INTRAMUSCULAR | Status: AC
  Filled 2015-12-04: qty 1

## 2015-12-04 MED ORDER — PROPOFOL 10 MG/ML IV BOLUS
INTRAVENOUS | Status: AC
  Filled 2015-12-04: qty 20

## 2015-12-04 MED ORDER — BUPIVACAINE LIPOSOME 1.3 % IJ SUSP
INTRAMUSCULAR | Status: AC
  Filled 2015-12-04: qty 20

## 2015-12-04 MED ORDER — DEXAMETHASONE SODIUM PHOSPHATE 10 MG/ML IJ SOLN
INTRAMUSCULAR | Status: AC
  Filled 2015-12-04: qty 1

## 2015-12-04 MED ORDER — MAGNESIUM CITRATE PO SOLN
1.0000 | Freq: Once | ORAL | Status: DC
  Filled 2015-12-04: qty 296

## 2015-12-04 MED ORDER — LACTATED RINGERS IR SOLN
Status: DC | PRN
  Administered 2015-12-04: 3000 mL

## 2015-12-04 MED ORDER — HYDROMORPHONE HCL 1 MG/ML IJ SOLN
INTRAMUSCULAR | Status: DC | PRN
Start: 2015-12-04 — End: 2015-12-04
  Administered 2015-12-04 (×2): 1 mg via INTRAVENOUS

## 2015-12-04 MED ORDER — HYDRALAZINE HCL 20 MG/ML IJ SOLN
INTRAMUSCULAR | Status: DC | PRN
  Administered 2015-12-04: 5 mg via INTRAVENOUS

## 2015-12-04 MED ORDER — ARTIFICIAL TEARS OP OINT
TOPICAL_OINTMENT | OPHTHALMIC | Status: AC
  Filled 2015-12-04: qty 3.5

## 2015-12-04 MED ORDER — SODIUM CHLORIDE 0.45 % IV SOLN
INTRAVENOUS | Status: DC
  Administered 2015-12-04 – 2015-12-06 (×4): via INTRAVENOUS

## 2015-12-04 MED ORDER — LABETALOL HCL 5 MG/ML IV SOLN
INTRAVENOUS | Status: AC
Start: 2015-12-04 — End: 2015-12-04
  Filled 2015-12-04: qty 4

## 2015-12-04 MED ORDER — SODIUM CHLORIDE 0.9 % IJ SOLN
INTRAMUSCULAR | Status: AC
  Filled 2015-12-04: qty 50

## 2015-12-04 MED ORDER — ONDANSETRON HCL 4 MG/2ML IJ SOLN
INTRAMUSCULAR | Status: AC
  Filled 2015-12-04: qty 2

## 2015-12-04 MED ORDER — ATORVASTATIN CALCIUM 10 MG PO TABS
10.0000 mg | ORAL_TABLET | Freq: Every morning | ORAL | Status: DC
  Administered 2015-12-05 – 2015-12-06 (×2): 10 mg via ORAL
  Filled 2015-12-04 (×2): qty 1

## 2015-12-04 MED ORDER — SUGAMMADEX SODIUM 200 MG/2ML IV SOLN
INTRAVENOUS | Status: AC
Start: 2015-12-04 — End: 2015-12-04
  Filled 2015-12-04: qty 2

## 2015-12-04 MED ORDER — ONDANSETRON HCL 4 MG/2ML IJ SOLN
4.0000 mg | INTRAMUSCULAR | Status: DC | PRN
  Administered 2015-12-04: 4 mg via INTRAVENOUS
  Filled 2015-12-04: qty 2

## 2015-12-04 MED ORDER — LACTATED RINGERS IV SOLN
INTRAVENOUS | Status: DC
Start: 2015-12-04 — End: 2015-12-06
  Administered 2015-12-04 (×2): via INTRAVENOUS

## 2015-12-04 MED ORDER — LABETALOL HCL 5 MG/ML IV SOLN
INTRAVENOUS | Status: AC
  Administered 2015-12-04: 10 mg via INTRAVENOUS
  Filled 2015-12-04: qty 4

## 2015-12-04 MED ORDER — DEXAMETHASONE SODIUM PHOSPHATE 10 MG/ML IJ SOLN
INTRAMUSCULAR | Status: DC | PRN
  Administered 2015-12-04: 8 mg via INTRAVENOUS

## 2015-12-04 MED ORDER — HYDROMORPHONE HCL 1 MG/ML IJ SOLN
INTRAMUSCULAR | Status: AC
  Filled 2015-12-04: qty 0.5

## 2015-12-04 MED ORDER — ONDANSETRON HCL 4 MG/2ML IJ SOLN
INTRAMUSCULAR | Status: DC | PRN
  Administered 2015-12-04: 4 mg via INTRAVENOUS

## 2015-12-04 MED ORDER — HYDROMORPHONE HCL 1 MG/ML IJ SOLN
INTRAMUSCULAR | Status: AC
  Administered 2015-12-04: 0.5 mg via INTRAVENOUS
  Filled 2015-12-04: qty 0.5

## 2015-12-04 MED ORDER — OXYCODONE HCL 5 MG/5ML PO SOLN: 5.0000 mg | Freq: Once | ORAL | Status: DC | PRN

## 2015-12-04 MED ORDER — MORPHINE SULFATE (PF) 2 MG/ML IV SOLN
2.0000 mg | INTRAVENOUS | Status: DC | PRN
  Administered 2015-12-05 – 2015-12-06 (×6): 2 mg via INTRAVENOUS
  Filled 2015-12-04 (×6): qty 1

## 2015-12-04 MED ORDER — SODIUM CHLORIDE 0.9 % IJ SOLN
INTRAMUSCULAR | Status: DC | PRN
  Administered 2015-12-04: 20 mL

## 2015-12-04 MED ORDER — DIPHENHYDRAMINE HCL 12.5 MG/5ML PO ELIX: 12.5000 mg | ORAL_SOLUTION | Freq: Four times a day (QID) | ORAL | Status: DC | PRN

## 2015-12-04 MED ORDER — MIDAZOLAM HCL 2 MG/2ML IJ SOLN
INTRAMUSCULAR | Status: AC
  Filled 2015-12-04: qty 2

## 2015-12-04 MED ORDER — INSULIN ASPART 100 UNIT/ML ~~LOC~~ SOLN
0.0000 [IU] | Freq: Three times a day (TID) | SUBCUTANEOUS | Status: DC
  Administered 2015-12-05: 3 [IU] via SUBCUTANEOUS
  Administered 2015-12-05: 5 [IU] via SUBCUTANEOUS
  Administered 2015-12-05: 3 [IU] via SUBCUTANEOUS
  Administered 2015-12-06: 2 [IU] via SUBCUTANEOUS

## 2015-12-04 MED ORDER — PROPOFOL 10 MG/ML IV BOLUS
INTRAVENOUS | Status: DC | PRN
  Administered 2015-12-04: 150 mg via INTRAVENOUS

## 2015-12-04 MED ORDER — LABETALOL HCL 5 MG/ML IV SOLN
20.0000 mg | Freq: Once | INTRAVENOUS | Status: AC
  Administered 2015-12-04: 10 mg via INTRAVENOUS

## 2015-12-04 MED ORDER — BUPIVACAINE LIPOSOME 1.3 % IJ SUSP
INTRAMUSCULAR | Status: DC | PRN
  Administered 2015-12-04: 20 mL

## 2015-12-04 MED ORDER — HYDROCODONE-ACETAMINOPHEN 5-325 MG PO TABS
1.0000 | ORAL_TABLET | ORAL | Status: DC | PRN
  Administered 2015-12-05 – 2015-12-06 (×3): 2 via ORAL
  Filled 2015-12-04 (×4): qty 2

## 2015-12-04 MED ORDER — HYDROMORPHONE HCL 1 MG/ML IJ SOLN
0.2500 mg | INTRAMUSCULAR | Status: DC | PRN
  Administered 2015-12-04 (×2): 0.5 mg via INTRAVENOUS

## 2015-12-04 MED ORDER — ROCURONIUM BROMIDE 50 MG/5ML IV SOSY
PREFILLED_SYRINGE | INTRAVENOUS | Status: AC
  Filled 2015-12-04: qty 5

## 2015-12-04 MED ORDER — FENTANYL CITRATE (PF) 100 MCG/2ML IJ SOLN
INTRAMUSCULAR | Status: AC
  Filled 2015-12-04: qty 4

## 2015-12-04 MED ORDER — LABETALOL HCL 5 MG/ML IV SOLN
INTRAVENOUS | Status: DC | PRN
  Administered 2015-12-04 (×2): 5 mg via INTRAVENOUS

## 2015-12-04 MED ORDER — FENTANYL CITRATE (PF) 100 MCG/2ML IJ SOLN
INTRAMUSCULAR | Status: DC | PRN
  Administered 2015-12-04: 100 ug via INTRAVENOUS
  Administered 2015-12-04 (×2): 50 ug via INTRAVENOUS

## 2015-12-04 MED ORDER — HYDRALAZINE HCL 20 MG/ML IJ SOLN
INTRAMUSCULAR | Status: AC
Start: 2015-12-04 — End: 2015-12-04
  Filled 2015-12-04: qty 1

## 2015-12-04 MED ORDER — ACETAMINOPHEN 325 MG PO TABS: 650.0000 mg | ORAL_TABLET | ORAL | Status: DC | PRN

## 2015-12-04 MED ORDER — OXYCODONE HCL 5 MG PO TABS: 5.0000 mg | ORAL_TABLET | Freq: Once | ORAL | Status: DC | PRN

## 2015-12-04 MED ORDER — CEFAZOLIN SODIUM-DEXTROSE 2-4 GM/100ML-% IV SOLN
INTRAVENOUS | Status: AC
  Filled 2015-12-04: qty 100

## 2015-12-04 MED ORDER — HYDROCODONE-ACETAMINOPHEN 5-325 MG PO TABS: 1.0000 | ORAL_TABLET | Freq: Four times a day (QID) | ORAL | 0 refills | Status: DC | PRN

## 2015-12-04 MED ORDER — LIDOCAINE 2% (20 MG/ML) 5 ML SYRINGE
INTRAMUSCULAR | Status: DC | PRN
  Administered 2015-12-04: 20 mg via INTRAVENOUS

## 2015-12-04 MED ORDER — DIPHENHYDRAMINE HCL 50 MG/ML IJ SOLN: 12.5000 mg | Freq: Four times a day (QID) | INTRAMUSCULAR | Status: DC | PRN

## 2015-12-04 SURGICAL SUPPLY — 65 items
BAG LAPAROSCOPIC 12 15 PORT 16 (BASKET) ×1 IMPLANT
BAG RETRIEVAL 12/15 (BASKET) ×2
CATH FOLEY 3WAY  5CC 18FR (CATHETERS) ×1
CATH FOLEY 3WAY 5CC 18FR (CATHETERS) ×1 IMPLANT
CHLORAPREP W/TINT 26ML (MISCELLANEOUS) ×2 IMPLANT
CLIP LIGATING HEM O LOK PURPLE (MISCELLANEOUS) ×6 IMPLANT
CLIP LIGATING HEMO O LOK GREEN (MISCELLANEOUS) ×4 IMPLANT
COVER SURGICAL LIGHT HANDLE (MISCELLANEOUS) ×2 IMPLANT
COVER TIP SHEARS 8 DVNC (MISCELLANEOUS) ×2 IMPLANT
COVER TIP SHEARS 8MM DA VINCI (MISCELLANEOUS) ×2
DECANTER SPIKE VIAL GLASS SM (MISCELLANEOUS) ×2 IMPLANT
DERMABOND ADVANCED (GAUZE/BANDAGES/DRESSINGS) ×1
DERMABOND ADVANCED .7 DNX12 (GAUZE/BANDAGES/DRESSINGS) ×1 IMPLANT
DRAIN CHANNEL 15F RND FF 3/16 (WOUND CARE) ×2 IMPLANT
DRAPE ARM DVNC X/XI (DISPOSABLE) ×4 IMPLANT
DRAPE COLUMN DVNC XI (DISPOSABLE) ×1 IMPLANT
DRAPE DA VINCI XI ARM (DISPOSABLE) ×4
DRAPE DA VINCI XI COLUMN (DISPOSABLE) ×1
DRAPE INCISE IOBAN 66X45 STRL (DRAPES) ×2 IMPLANT
DRAPE SHEET LG 3/4 BI-LAMINATE (DRAPES) ×2 IMPLANT
DRSG TEGADERM 4X4.75 (GAUZE/BANDAGES/DRESSINGS) ×2 IMPLANT
ELECT PENCIL ROCKER SW 15FT (MISCELLANEOUS) ×2 IMPLANT
ELECT REM PT RETURN 9FT ADLT (ELECTROSURGICAL) ×2
ELECTRODE REM PT RTRN 9FT ADLT (ELECTROSURGICAL) ×1 IMPLANT
EVACUATOR SILICONE 100CC (DRAIN) ×2 IMPLANT
GAUZE SPONGE 2X2 8PLY STRL LF (GAUZE/BANDAGES/DRESSINGS) ×1 IMPLANT
GLOVE BIO SURGEON STRL SZ 6.5 (GLOVE) ×2 IMPLANT
GLOVE BIOGEL M STRL SZ7.5 (GLOVE) ×4 IMPLANT
GOWN STRL REUS W/TWL LRG LVL3 (GOWN DISPOSABLE) ×6 IMPLANT
IRRIG SUCT STRYKERFLOW 2 WTIP (MISCELLANEOUS) ×2
IRRIGATION SUCT STRKRFLW 2 WTP (MISCELLANEOUS) ×1 IMPLANT
KIT BASIN OR (CUSTOM PROCEDURE TRAY) ×2 IMPLANT
NEEDLE INSUFFLATION 14GA 120MM (NEEDLE) ×2 IMPLANT
NS IRRIG 1000ML POUR BTL (IV SOLUTION) IMPLANT
POSITIONER SURGICAL ARM (MISCELLANEOUS) ×4 IMPLANT
RELOAD STAPLER WHITE 60MM (STAPLE) ×2 IMPLANT
RELOAD WHITE ECR60W (STAPLE) IMPLANT
SEAL CANN UNIV 5-8 DVNC XI (MISCELLANEOUS) ×4 IMPLANT
SEAL XI 5MM-8MM UNIVERSAL (MISCELLANEOUS) ×4
SET IRRIG Y TYPE TUR BLADDER L (SET/KITS/TRAYS/PACK) ×4 IMPLANT
SOLUTION ELECTROLUBE (MISCELLANEOUS) ×2 IMPLANT
SPONGE GAUZE 2X2 STER 10/PKG (GAUZE/BANDAGES/DRESSINGS) ×1
STAPLE ECHEON FLEX 60 POW ENDO (STAPLE) IMPLANT
STAPLER RELOAD WHITE 60MM (STAPLE) ×4
SUT ETHILON 3 0 PS 1 (SUTURE) ×2 IMPLANT
SUT MNCRL AB 4-0 PS2 18 (SUTURE) ×4 IMPLANT
SUT PDS AB 1 CTX 36 (SUTURE) IMPLANT
SUT PDS AB 1 TP1 96 (SUTURE) ×4 IMPLANT
SUT V-LOC BARB 180 2/0GR6 GS22 (SUTURE)
SUT VIC AB 0 CT1 27 (SUTURE) ×1
SUT VIC AB 0 CT1 27XBRD ANTBC (SUTURE) ×1 IMPLANT
SUT VIC AB 2-0 SH 18 (SUTURE) IMPLANT
SUT VICRYL 0 UR6 27IN ABS (SUTURE) IMPLANT
SUT VLOC BARB 180 ABS3/0GR12 (SUTURE) ×2
SUTURE V-LC BRB 180 2/0GR6GS22 (SUTURE) IMPLANT
SUTURE VLOC BRB 180 ABS3/0GR12 (SUTURE) ×1 IMPLANT
TAPE STRIPS DRAPE STRL (GAUZE/BANDAGES/DRESSINGS) ×2 IMPLANT
TOWEL OR NON WOVEN STRL DISP B (DISPOSABLE) ×2 IMPLANT
TRAY FOLEY W/METER SILVER 16FR (SET/KITS/TRAYS/PACK) ×2 IMPLANT
TRAY LAPAROSCOPIC (CUSTOM PROCEDURE TRAY) ×2 IMPLANT
TROCAR BLADELESS OPT 5 100 (ENDOMECHANICALS) IMPLANT
TROCAR ENDOPATH XCEL 12X100 BL (ENDOMECHANICALS) ×2 IMPLANT
TROCAR XCEL 12X100 BLDLESS (ENDOMECHANICALS) ×2 IMPLANT
WATER STERILE IRR 1000ML POUR (IV SOLUTION) ×2 IMPLANT
WATER STERILE IRR 1500ML POUR (IV SOLUTION) IMPLANT

## 2015-12-04 NOTE — Brief Op Note (Signed)
12/04/2015  4:19 PM  PATIENT:  Elmer Bales Melecio  70 y.o. male  PRE-OPERATIVE DIAGNOSIS:  LEFT URETERAL TRANSITIONAL CELL CARCINOMA  POST-OPERATIVE DIAGNOSIS:  LEFT URETERAL TRANSITIONAL CELL CARCINOMA  PROCEDURE:  Procedure(s): XI ROBOT ASSITED LAPAROSCOPIC NEPHROURETERECTOMY (Left)  SURGEON:  Surgeon(s) and Role:    * Cleon Gustin, MD - Primary  PHYSICIAN ASSISTANT:  Debbrah Alar, PA  ASSISTANTS: Lorayne Bender, MD   ANESTHESIA:   general  EBL:  Total I/O In: 1000 [I.V.:1000] Out: 200 [Blood:200]  BLOOD ADMINISTERED:none  DRAINS: (1) Jackson-Pratt drain(s) with closed bulb suction in the LLQ and Urinary Catheter (Foley)   LOCAL MEDICATIONS USED:  NONE  SPECIMEN:  Source of Specimen:  Left kidney, ureter and bladder cuff  DISPOSITION OF SPECIMEN:  PATHOLOGY  COUNTS:  YES  TOURNIQUET:  * No tourniquets in log *  DICTATION: .Note written in EPIC  PLAN OF CARE: Admit to inpatient   PATIENT DISPOSITION:  PACU - hemodynamically stable.   Delay start of Pharmacological VTE agent (>24hrs) due to surgical blood loss or risk of bleeding: not applicable

## 2015-12-04 NOTE — Transfer of Care (Signed)
Immediate Anesthesia Transfer of Care Note  Patient: Dalton Miller  Procedure(s) Performed: Procedure(s): XI ROBOT ASSITED LAPAROSCOPIC NEPHROURETERECTOMY (Left)  Patient Location: PACU  Anesthesia Type:General  Level of Consciousness:  sedated, patient cooperative and responds to stimulation  Airway & Oxygen Therapy:Patient Spontanous Breathing and Patient connected to face mask oxgen  Post-op Assessment:  Report given to PACU RN and Post -op Vital signs reviewed and stable  Post vital signs:  Reviewed and stable  Last Vitals:  Vitals:   12/04/15 0946  BP: (!) 155/71  Pulse: 64  Resp: 16  Temp: 99.3 C    Complications: No apparent anesthesia complicationsImmediate Anesthesia Transfer of Care Note  Patient: Dalton Miller  Procedure(s) Performed: Procedure(s): XI ROBOT ASSITED LAPAROSCOPIC NEPHROURETERECTOMY (Left)  Patient Location: PACU  Anesthesia Type:General  Level of Consciousness: awake, oriented, sedated and patient cooperative  Airway & Oxygen Therapy: Patient Spontanous Breathing and Patient connected to face mask oxygen  Post-op Assessment: Report given to RN, Post -op Vital signs reviewed and stable and Patient moving all extremities  Post vital signs: Reviewed and stable  Last Vitals:  Vitals:   12/04/15 0946  BP: (!) 155/71  Pulse: 64  Resp: 16  Temp: 36.6 C    Last Pain:  Vitals:   12/04/15 0946  TempSrc: Oral      Patients Stated Pain Goal: 3 (71/69/67 [id number])  Complications: No apparent anesthesia complications

## 2015-12-04 NOTE — Anesthesia Procedure Notes (Signed)
Procedure Name: Intubation Date/Time: 12/04/2015 12:42 PM Performed by: Lajuana Carry E Pre-anesthesia Checklist: Patient identified, Emergency Drugs available, Suction available and Patient being monitored Patient Re-evaluated:Patient Re-evaluated prior to inductionOxygen Delivery Method: Circle system utilized Preoxygenation: Pre-oxygenation with 100% oxygen Intubation Type: IV induction Ventilation: Mask ventilation without difficulty Laryngoscope Size: Miller and 3 Grade View: Grade II Tube type: Oral Tube size: 7.5 mm Number of attempts: 1 Airway Equipment and Method: Stylet and Oral airway Placement Confirmation: ETT inserted through vocal cords under direct vision,  positive ETCO2 and breath sounds checked- equal and bilateral Secured at: 23 cm Tube secured with: Tape Dental Injury: Teeth and Oropharynx as per pre-operative assessment  Comments: Upper tooth noted to be loose prior to DL, care taken to not touch tooth. Intubated atraumatically. Teeth/lips as pre op

## 2015-12-04 NOTE — H&P (Signed)
Urology Admission H&P  Chief Complaint: gross hematurai  History of Present Illness: Dalton Miller is a 70yo with a hx of gross hematuria who was found to have a left ureteral tumor which was biopsied as low grade TCC. It was not amenable to ablation.  Past Medical History:  Diagnosis Date  . Acquired renal cyst of right kidney   . Cancer ([place])    renal cell   . Erectile dysfunction   . Heart murmur    CHILDHOOD  . Hematuria    left ureteral bleeding  . History of cellulitis    2012 left wrist  . History of kidney stones   . Hyperlipidemia   . Hypertension   . Nephrolithiasis    bilateral non-obstructive per ct 07-02-2015  . Type 2 diabetes mellitus ([place])   . Wears glasses    Past Surgical History:  Procedure Laterality Date  . CARPAL TUNNEL RELEASE Right 1990  approx  . COLONOSCOPY  03/04/2010  . CYSTOSCOPY W/ RETROGRADES Left 10/28/2015   Procedure: CYSTOSCOPY WITH LEFT RETROGRADE PYELOGRAM;  Surgeon: Cleon Gustin, MD;  Location: [facility];  Service: Urology;  Laterality: Left;  . CYSTOSCOPY W/ URETERAL STENT PLACEMENT Left 10/28/2015   Procedure: CYSTOSCOPY WITH LEFT URETERAL STENT EXCHANGE;  Surgeon: Cleon Gustin, MD;  Location: [facility];  Service: Urology;  Laterality: Left;  . CYSTOSCOPY WITH RETROGRADE PYELOGRAM, URETEROSCOPY AND STENT PLACEMENT Bilateral 09/17/2015   Procedure: CYSTOSCOPY WITH BILATERAL RETROGRADE PYELOGRAM, LEFT URETEROSCOPY WITH URETERAL BIOPSY AND  STENT PLACEMENT;  Surgeon: Irine Seal, MD;  Location[facility];  Service: Urology;  Laterality: Bilateral;  . HOLMIUM LASER APPLICATION Left 1/95/0932   Procedure: HOLMIUM LASER APPLICATION;  Surgeon: Irine Seal, MD;  Location: [facility];  Service: Urology;  Laterality: Left;  . KNEE ARTHROSCOPY Right 2008  . right total knee replacement      2009  . STONE EXTRACTION WITH BASKET Left 09/17/2015   Procedure: STONE EXTRACTION WITH BASKET;  Surgeon: Irine Seal, MD;   Location: [facility];  Service: Urology;  Laterality: Left;  . TOTAL KNEE ARTHROPLASTY Right 2009  . URETEROSCOPY  10/28/2015   Procedure: DIAGNOSTIC LEFT URETEROSCOPY;  Surgeon: Cleon Gustin, MD;  Location: [facility];  Service: Urology;;    Home Medications:  Prescriptions Prior to Admission  Medication Sig Dispense Refill Last Dose  . aspirin EC 81 MG tablet Take 1 tablet (81 mg total) by mouth daily. 90 tablet 1 11/27/2015  . atorvastatin (LIPITOR) 10 MG tablet Take 1 tablet (10 mg total) by mouth every morning. 90 tablet 1 12/03/2015 at Unknown time  . glipiZIDE (GLUCOTROL) 5 MG tablet Take 1 tablet (5 mg total) by mouth daily before breakfast. 90 tablet 1 12/03/2015 at Unknown time  . glucose blood test strip Check BS daily and as needed 100 each 2   . lisinopril (PRINIVIL,ZESTRIL) 10 MG tablet Take 1 tablet (10 mg total) by mouth every morning. 90 tablet 1 12/03/2015 at Unknown time  . metFORMIN (GLUCOPHAGE) 500 MG tablet TAKE (1) TABLET BY MOUTH TWICE DAILY WITH A MEAL. 180 tablet 2 12/03/2015 at Unknown time  . HYDROcodone-acetaminophen (NORCO) 5-325 MG tablet Take 1 tablet by mouth every 6 (six) hours as needed for moderate pain. (Patient not taking: Reported on 11/20/2015) 30 tablet 0 Not Taking at Unknown time  . phenazopyridine (PYRIDIUM) 200 MG tablet Take 1 tablet (200 mg total) by mouth 3 (three) times daily as needed for pain. (Patient not  taking: Reported on 11/20/2015) 15 tablet 1 Not Taking at Unknown time   Allergies: No Known Allergies  Family History  Problem Relation Age of Onset  . Diabetes Mother   . Heart disease Mother   . Diabetes Father    Social History:  reports that he has been smoking Cigarettes and Cigars.  He has a 90.00 pack-year smoking history. He quit smokeless tobacco use about 22 years ago. His smokeless tobacco use included Chew. He reports that he does not drink alcohol or use drugs.  Review of Systems  Genitourinary: Positive  for hematuria.  All other systems reviewed and are negative.   Physical Exam:  Vital signs in last 24 hours: Temp:  [97.9 F (36.6 C)] 97.9 F (36.6 C) (12/01 0946) Pulse Rate:  [64] 64 (12/01 0946) Resp:  [16] 16 (12/01 0946) BP: (155)/(71) 155/71 (12/01 0946) SpO2:  [99 %] 99 % (12/01 0946) Weight:  [81.6 kg (180 lb)] 81.6 kg (180 lb) (12/01 9150) Physical Exam  Constitutional: He is oriented to person, place, and time. He appears well-developed and well-nourished.  HENT:  Head: Normocephalic and atraumatic.  Eyes: EOM are normal. Pupils are equal, round, and reactive to light.  Neck: Normal range of motion. No thyromegaly present.  Cardiovascular: Normal rate and regular rhythm.   Respiratory: Effort normal. No respiratory distress.  GI: Soft. He exhibits no distension.  Musculoskeletal: Normal range of motion. He exhibits no edema.  Neurological: He is alert and oriented to person, place, and time.  Skin: Skin is warm and dry.  Psychiatric: He has a normal mood and affect. His behavior is normal. Judgment and thought content normal.    Laboratory Data:  Results for orders placed or performed during the hospital encounter of 12/04/15 (from the past 24 hour(s))  Glucose, capillary     Status: None   Collection Time: 12/04/15  9:43 AM  Result Value Ref Range   Glucose-Capillary 86 65 - 99 mg/dL   No results found for this or any previous visit (from the past 240 hour(s)). Creatinine:  Recent Labs  11/30/15 1146  CREATININE 0.86   Baseline Creatinine: 0.9  Impression/Assessment:  70yo with left upper tract TCC  Plan:  The risks/benefits/alternatives to left nephroureterectomy was explained to the patient and he understands and wishes to proceed with surgery  Nicolette Bang 12/04/2015, 11:43 AM

## 2015-12-04 NOTE — Anesthesia Preprocedure Evaluation (Addendum)
Anesthesia Evaluation  Patient identified by MRN, date of birth, ID band Patient awake    Reviewed: Allergy & Precautions, NPO status , Patient's Chart, lab work & pertinent test results  Airway Mallampati: I  TM Distance: >3 FB Neck ROM: Full    Dental  (+) Dental Advisory Given, Missing, Poor Dentition,    Pulmonary Current Smoker,    breath sounds clear to auscultation       Cardiovascular hypertension,  Rhythm:Regular Rate:Normal     Neuro/Psych    GI/Hepatic negative GI ROS, Neg liver ROS,   Endo/Other  diabetes  Renal/GU Renal disease     Musculoskeletal   Abdominal   Peds  Hematology negative hematology ROS (+)   Anesthesia Other Findings   Reproductive/Obstetrics                           Anesthesia Physical Anesthesia Plan  ASA: III  Anesthesia Plan: General   Post-op Pain Management:    Induction: Intravenous  Airway Management Planned: Oral ETT  Additional Equipment:   Intra-op Plan:   Post-operative Plan: Extubation in OR  Informed Consent: I have reviewed the patients History and Physical, chart, labs and discussed the procedure including the risks, benefits and alternatives for the proposed anesthesia with the patient or authorized representative who has indicated his/her understanding and acceptance.     Plan Discussed with: CRNA  Anesthesia Plan Comments:         Anesthesia Quick Evaluation

## 2015-12-04 NOTE — Anesthesia Postprocedure Evaluation (Signed)
Anesthesia Post Note  Patient: Dalton Miller  Procedure(s) Performed: Procedure(s) (LRB): XI ROBOT ASSITED LAPAROSCOPIC NEPHROURETERECTOMY (Left)  Patient location during evaluation: PACU Anesthesia Type: General Level of consciousness: awake Pain management: pain level controlled Vital Signs Assessment: post-procedure vital signs reviewed and stable Respiratory status: spontaneous breathing and patient connected to nasal cannula oxygen Cardiovascular status: stable Postop Assessment: no signs of nausea or vomiting Anesthetic complications: no    Last Vitals:  Vitals:   12/04/15 1730 12/04/15 1750  BP: (!) 171/82 (!) 174/80  Pulse: 85 88  Resp: 16 16  Temp: 36.4 C 36.8 C    Last Pain:  Vitals:   12/04/15 0946  TempSrc: Oral                 Nayquan Evinger

## 2015-12-04 NOTE — Discharge Instructions (Signed)

## 2015-12-05 LAB — GLUCOSE, CAPILLARY
GLUCOSE-CAPILLARY: 196 mg/dL — AB (ref 65–99)
GLUCOSE-CAPILLARY: 208 mg/dL — AB (ref 65–99)
Glucose-Capillary: 180 mg/dL — ABNORMAL HIGH (ref 65–99)
Glucose-Capillary: 91 mg/dL (ref 65–99)

## 2015-12-05 LAB — BASIC METABOLIC PANEL
Anion gap: 9 (ref 5–15)
BUN: 23 mg/dL — AB (ref 6–20)
CHLORIDE: 98 mmol/L — AB (ref 101–111)
CO2: 25 mmol/L (ref 22–32)
CREATININE: 1.33 mg/dL — AB (ref 0.61–1.24)
Calcium: 8.5 mg/dL — ABNORMAL LOW (ref 8.9–10.3)
GFR calc Af Amer: >60 mL/min (ref >60)
GFR calc non Af Amer: 53 mL/min — ABNORMAL LOW (ref >60)
Glucose, Bld: 217 mg/dL — ABNORMAL HIGH (ref 65–99)
Potassium: 4.7 mmol/L (ref 3.5–5.1)
SODIUM: 132 mmol/L — AB (ref 135–145)

## 2015-12-05 LAB — HEMOGLOBIN AND HEMATOCRIT, BLOOD
HCT: 43.1 % (ref 39.0–52.0)
Hemoglobin: 14.5 g/dL (ref 13.0–17.0)

## 2015-12-05 NOTE — Progress Notes (Signed)
1 Day Post-Op   Assessment and Plan: 1. S/P robotic left NU.  Doing well but still with moderate bloody drainage.   I will advance diet and reassess tomorrow.  Subjective: Dalton Miller is doing well post op.  He had some nausea last night but none now and he denies pain.   He has moderate bloody JP drainage but the foley is draining well.  He has no other complaints and would like to advance his diet.  Labs reviewed.  ROS:  Review of Systems  Constitutional: Negative for chills and fever.  Gastrointestinal: Negative for abdominal pain.  All other systems reviewed and are negative.   Anti-infectives: Anti-infectives    Start     Dose/Rate Route Frequency Ordered Stop   12/04/15 0941  ceFAZolin (ANCEF) IVPB 2g/100 mL premix     2 g 200 mL/hr over 30 Minutes Intravenous 30 min pre-op 12/04/15 0941 12/04/15 1310      Current Facility-Administered Medications  Medication Dose Route Frequency Provider Last Rate Last Dose  . 0.45 % sodium chloride infusion   Intravenous Continuous Debbrah Alar, PA-C 100 mL/hr at 12/05/15 0541    . acetaminophen (TYLENOL) tablet 650 mg  650 mg Oral Q4H PRN Debbrah Alar, PA-C      . atorvastatin (LIPITOR) tablet 10 mg  10 mg Oral q morning - 10a Amanda Dancy, PA-C      . diphenhydrAMINE (BENADRYL) injection 12.5 mg  12.5 mg Intravenous Q6H PRN Amanda Dancy, PA-C       Or  . diphenhydrAMINE (BENADRYL) 12.5 MG/5ML elixir 12.5 mg  12.5 mg Oral Q6H PRN Debbrah Alar, PA-C      . HYDROcodone-acetaminophen (NORCO/VICODIN) 5-325 MG per tablet 1-2 tablet  1-2 tablet Oral Q4H PRN Debbrah Alar, PA-C      . insulin aspart (novoLOG) injection 0-15 Units  0-15 Units Subcutaneous TID WC Debbrah Alar, PA-C   3 Units at 12/05/15 0853  . lactated ringers infusion   Intravenous Continuous Annye Asa, MD 50 mL/hr at 12/04/15 1105    . morphine 2 MG/ML injection 2-4 mg  2-4 mg Intravenous Q2H PRN Debbrah Alar, PA-C   2 mg at 12/05/15 0538  . ondansetron (ZOFRAN) injection 4  mg  4 mg Intravenous Q4H PRN Debbrah Alar, PA-C   4 mg at 12/04/15 1846     Objective: Vital signs in last 24 hours: Temp:  [97.6 F (36.4 C)-98.3 F (36.8 C)] 97.8 F (36.6 C) (12/02 0430) Pulse Rate:  [64-90] 82 (12/02 0430) Resp:  [16-18] 16 (12/02 0430) BP: (98-208)/(61-111) 151/61 (12/02 0430) SpO2:  [97 %-100 %] 100 % (12/02 0430) Weight:  [81.6 kg (180 lb)] 81.6 kg (180 lb) (12/01 0958)  Intake/Output from previous day: 12/01 0701 - 12/02 0700 In: 4475 [P.O.:960; I.V.:3515] Out: 2175 [Urine:1875; Drains:100; Blood:200] Intake/Output this shift: Total I/O In: -  Out: 30 [Drains:30]   Physical Exam  Constitutional: He is well-developed, well-nourished, and in no distress.  Cardiovascular: Normal rate, regular rhythm and normal heart sounds.   Pulmonary/Chest: Effort normal and breath sounds normal. No respiratory distress.  Abdominal: Soft. Bowel sounds are normal. He exhibits no distension. There is no tenderness.  Wounds intact.  JP drainage is bloody.    Musculoskeletal: Normal range of motion. He exhibits no edema or tenderness.    Lab Results:   Recent Labs  12/04/15 1658 12/05/15 0530  HGB 15.6 14.5  HCT 46.1 43.1   BMET  Recent Labs  12/05/15 0530  NA 132*  K 4.7  CL 98*  CO2 25  GLUCOSE 217*  BUN 23*  CREATININE 1.33*  CALCIUM 8.5*   PT/INR No results for input(s): LABPROT, INR in the last 72 hours. ABG No results for input(s): PHART, HCO3 in the last 72 hours.  Invalid input(s): PCO2, PO2  Studies/Results: No results found.          LOS: 1 day    Dalton Miller 12/05/2015 [phone] ID: Dalton Miller, male   DOB: [birth year], 70 y.o.   MRN: [id number]

## 2015-12-06 LAB — GLUCOSE, CAPILLARY: GLUCOSE-CAPILLARY: 129 mg/dL — AB (ref 65–99)

## 2015-12-06 NOTE — Discharge Summary (Signed)
Physician Discharge Summary  Patient ID: Dalton Miller MRN: [id number] DOB/AGE: [birth year] 69 y.o.  Admit date: 12/04/2015 Discharge date: 12/06/2015  Admission Diagnoses:  Primary ureteral papillary carcinoma, left [facility])  Discharge Diagnoses:  Principal Problem:   Primary ureteral papillary carcinoma, left [facility])   Past Medical History:  Diagnosis Date  . Acquired renal cyst of right kidney   . Cancer ([place])    renal cell   . Erectile dysfunction   . Heart murmur    CHILDHOOD  . Hematuria    left ureteral bleeding  . History of cellulitis    2012 left wrist  . History of kidney stones   . Hyperlipidemia   . Hypertension   . Nephrolithiasis    bilateral non-obstructive per ct 07-02-2015  . Type 2 diabetes mellitus ([place])   . Wears glasses     Surgeries: Procedure(s): XI ROBOT ASSITED LAPAROSCOPIC NEPHROURETERECTOMY on 12/04/2015   Consultants (if any):   Discharged Condition: Improved  Hospital Course: Dalton Miller is an 70 y.o. male who was admitted 12/04/2015 with a diagnosis of Primary ureteral papillary carcinoma, left ([place]) and went to the operating room on 12/04/2015 and underwent the above named procedures.  He has done well and the JP will be discontinued today and he will be sent home with the foley.    He was given perioperative antibiotics:  Anti-infectives    Start     Dose/Rate Route Frequency Ordered Stop   12/04/15 0941  ceFAZolin (ANCEF) IVPB 2g/100 mL premix     2 g 200 mL/hr over 30 Minutes Intravenous 30 min pre-op 12/04/15 0941 12/04/15 1310    .  He was given sequential compression devices and early ambulation for DVT prophylaxis.  He benefited maximally from the hospital stay and there were no complications.    Recent vital signs:  Vitals:   12/05/15 2142 12/06/15 0658  BP: 140/74 (!) 170/85  Pulse: (!) 59 66  Resp: 18 18  Temp: 98.7 F (37.1 C) 98.7 F (37.1 C)    Recent laboratory studies:  Lab Results  Component Value Date   HGB  14.5 12/05/2015   HGB 15.6 12/04/2015   HGB 16.3 11/30/2015   Lab Results  Component Value Date   WBC 9.1 11/30/2015   PLT 164 11/30/2015   No results found for: INR Lab Results  Component Value Date   NA 132 (L) 12/05/2015   K 4.7 12/05/2015   CL 98 (L) 12/05/2015   CO2 25 12/05/2015   BUN 23 (H) 12/05/2015   CREATININE 1.33 (H) 12/05/2015   GLUCOSE 217 (H) 12/05/2015    Discharge Medications:     Medication List    STOP taking these medications   aspirin EC 81 MG tablet     TAKE these medications   atorvastatin 10 MG tablet Commonly known as:  LIPITOR Take 1 tablet (10 mg total) by mouth every morning.   glipiZIDE 5 MG tablet Commonly known as:  GLUCOTROL Take 1 tablet (5 mg total) by mouth daily before breakfast.   glucose blood test strip Check BS daily and as needed   HYDROcodone-acetaminophen 5-325 MG tablet Commonly known as:  NORCO Take 1-2 tablets by mouth every 6 (six) hours as needed for moderate pain. What changed:  how much to take   lisinopril 10 MG tablet Commonly known as:  PRINIVIL,ZESTRIL Take 1 tablet (10 mg total) by mouth every morning.   metFORMIN 500 MG tablet Commonly known as:  GLUCOPHAGE TAKE (  1) TABLET BY MOUTH TWICE DAILY WITH A MEAL.   phenazopyridine 200 MG tablet Commonly known as:  PYRIDIUM Take 1 tablet (200 mg total) by mouth 3 (three) times daily as needed for pain.       Diagnostic Studies: No results found.  Disposition: 01-Home or Self Care  Discharge Instructions    Discontinue IV    Complete by:  As directed       Follow-up Information    Nicolette Bang, MD Follow up on 12/23/2015.   Specialty:  Urology Why:  at 9:30 Contact information: [address] [place] [address] [phone]            Signed: Malka So 12/06/2015, 9:07 AM

## 2015-12-07 ENCOUNTER — Encounter (HOSPITAL_COMMUNITY): Payer: Self-pay | Admitting: Urology

## 2015-12-08 NOTE — Op Note (Signed)
Preoperative diagnosis: Left upper tract transitional cell carcinoma  Postop diagnosis: Same  Procedure: 1.  Left robot assisted laparoscopic radical nephroureterectomy 2.  Lysis of adhesions, moderate   Attending: Nicolette Bang  Assistant: Debbrah Alar, PA  Resident: Lorayne Bender, MD  Anesthesia: General  Estimated blood loss: 150 cc  Drains: 16 French Foley catheter, JP drain  Specimens: Left radical nephroureterectomy and bladder cuff  Antibiotics: ancef  Findings: Multiple anterior wall adhesions. No bladder cuff leak at 150cc.  Indications: Patient is a 70 year old with a history of low grade ureteral TCC no amenable to resection.  After discussing treatment options patient decided to proceed with left robot assisted laparoscopic radical nephroureterectomy  Procedure in detail: Prior to procedure consent was obtained. Patient was brought to the operating room and briefing was done sure correct patient, correct procedure, correct site.  General anesthesia was in administered patient was placed in the right lateral decubitus position. A 16 French catheter was placed. their abdomen and flank was then prepped and draped usual sterile fashion.  A Veress needle was used to obtain pneumoperitoneum.  Once pneumoperitoneum was reestablished to 15 mmHg we then placed a 12 mm camera port lateral to the umbilicus at the latera; edge of rectus.  We then proceeded to place 4 more robotic ports. We then placed 2 assistant ports. We then docked the robot.  We then started this dissection by removing a extensive amount of anterior abdominal wall adhesions.  We then dissected along the white line of [place].  We then reflected the colon medially.  We then identified the psoas muscle.  Once this was done we traced it down to the iliac vessels and identified the ureter.  Once we identified the gonadal vein and ureter were then traced this to the renal hilum.  The renal vein and renal  artery were skeletonized.  We did we identified one renal vein one renal artery.  Using the [employer] power stapler within ligated the renal artery.  Once this was done we then used a second staple load to ligate the renal vein.  Once this was done we then freed the kidney from its lateral and posterior attachments. Once the kidney was freed we then proceeded to dissect the ureter further. We dissected it down to where it crossed the iliac vessels. At this point we then redocked the robot and proceeded with dissecting the ureter to the bladder. We excised the ureter with appoximately a 2cm bladder cuff. We then closed the defect with a 2-0 V-lock suture in 2 layers. We then filed the bladder with 150cc and noted no leak. We then used a Endo Catch bag to remove the specimen.  Once the specimen was in the Endo Catch bag we then inspected the retroperitoneum and noted no residual bleeding. We then placed a JP drain in the left lower quadrant robot port. This was secured to the skin with a 2-0 nylon. We then removed our instruments, undocked the robot, and released the pneumoperitoneum.  We then made a midline incision above the umbilicus to remove the specimen.  Once the specimen was removed we then closed the camera and assistant ports with 0 Vicryl in interrupted fashion.  We then closed the midline incision with looped PDS in a running fashion.  We then closed the overlying skin with 2-0 Vicryl in running fashion.  These skin was then subcuticularly closed with 4-0 Monocryl.  We then placed Dermabond over all the incisions.  This included the procedure which  resulted by the patient. The Assistant was utilized for retraction and to apply the staple loads across the renal hilum.  Complications: None  Condition: Stable, x-rayed, transferred to PACU.  Plan: Patient is to be admitted for inpatient stay. They will be started on a clear liquid diet POD#1. His foley will remain in place for 2 weeks and he will have  a cystogram prior to removal.

## 2015-12-14 ENCOUNTER — Emergency Department (HOSPITAL_COMMUNITY)
Admission: EM | Admit: 2015-12-14 | Discharge: 2015-12-14 | Disposition: A | Discharge status: Home / Self Care | Payer: Commercial Managed Care - HMO | Attending: Emergency Medicine | Admitting: Emergency Medicine

## 2015-12-14 ENCOUNTER — Encounter (HOSPITAL_COMMUNITY): Payer: Self-pay | Admitting: Emergency Medicine

## 2015-12-14 DIAGNOSIS — T83091A Other mechanical complication of indwelling urethral catheter, initial encounter: Secondary | ICD-10-CM

## 2015-12-14 DIAGNOSIS — T83098A Other mechanical complication of other indwelling urethral catheter, initial encounter: Secondary | ICD-10-CM | POA: Diagnosis not present

## 2015-12-14 DIAGNOSIS — E119 Type 2 diabetes mellitus without complications: Secondary | ICD-10-CM | POA: Diagnosis not present

## 2015-12-14 DIAGNOSIS — F17290 Nicotine dependence, other tobacco product, uncomplicated: Secondary | ICD-10-CM | POA: Diagnosis not present

## 2015-12-14 DIAGNOSIS — F17210 Nicotine dependence, cigarettes, uncomplicated: Secondary | ICD-10-CM | POA: Diagnosis not present

## 2015-12-14 DIAGNOSIS — Z79899 Other long term (current) drug therapy: Secondary | ICD-10-CM | POA: Insufficient documentation

## 2015-12-14 DIAGNOSIS — I10 Essential (primary) hypertension: Secondary | ICD-10-CM | POA: Insufficient documentation

## 2015-12-14 DIAGNOSIS — Z7984 Long term (current) use of oral hypoglycemic drugs: Secondary | ICD-10-CM | POA: Insufficient documentation

## 2015-12-14 DIAGNOSIS — Z85528 Personal history of other malignant neoplasm of kidney: Secondary | ICD-10-CM | POA: Insufficient documentation

## 2015-12-14 DIAGNOSIS — Y733 Surgical instruments, materials and gastroenterology and urology devices (including sutures) associated with adverse incidents: Secondary | ICD-10-CM | POA: Diagnosis not present

## 2015-12-14 LAB — URINALYSIS, ROUTINE W REFLEX MICROSCOPIC
BACTERIA UA: NONE SEEN
Bilirubin Urine: NEGATIVE
GLUCOSE, UA: NEGATIVE mg/dL
Ketones, ur: NEGATIVE mg/dL
Leukocytes, UA: NEGATIVE
NITRITE: NEGATIVE
PH: 6 (ref 5.0–8.0)
Protein, ur: NEGATIVE mg/dL
Specific Gravity, Urine: 1.006 (ref 1.005–1.030)

## 2015-12-14 NOTE — ED Notes (Signed)
Pt's catheter irrigated with 181m of sterile water. Pt has had 4586mof urine output in foley and irrigation waste. Bladder scanner resulted in [id number] unable to produce urine in foley bag since this morning, normal daily output is 2000m85m

## 2015-12-14 NOTE — ED Triage Notes (Signed)
Pt reports he has been unable to void since this am. Pt has foley in place, pt states urine in bag has been present since 0730 this am. Pt had a kidney removed 12/1.

## 2015-12-14 NOTE — ED Provider Notes (Signed)
[facility] DEPT Provider Note   CSN: [id number] Arrival date & time: 12/14/15  1547     History   Chief Complaint Chief Complaint  Patient presents with  . Unable to void, status post nephrectomy    HPI Dalton Miller is a 70 y.o. male.  HPI This is a 70 y/o male with a PMH primary ureteral papillary carcinoma of the left kidney who is s/p laparoscopic nephroureterectomy on 12/04/15 and wad discharged with a foley with concerns for inability to void.   This morning he emptied out his foley 800cc. He notes he has only had approximately 10-20 cc of output since that time.  He's  had intermittent abdominal pain since noon which has been constant since 3:30pm. He called Dr. Alyson Ingles with urology, who's nurse advised him that he should come to [facility].   He's eating and drinking okay. No fevers/chills.  No N/V.    Past Medical History:  Diagnosis Date  . Acquired renal cyst of right kidney   . Cancer ([place])    renal cell   . Erectile dysfunction   . Heart murmur    CHILDHOOD  . Hematuria    left ureteral bleeding  . History of cellulitis    2012 left wrist  . History of kidney stones   . Hyperlipidemia   . Hypertension   . Nephrolithiasis    bilateral non-obstructive per ct 07-02-2015  . Type 2 diabetes mellitus ([place])   . Wears glasses     Patient Active Problem List   Diagnosis Date Noted  . Primary ureteral papillary carcinoma, left ([place]) 12/04/2015  . Overweight (BMI 25.0-29.9) 07/27/2015  . Vitamin D deficiency 02/16/2014  . Essential hypertension 02/14/2014  . Tobacco user   . Erectile dysfunction   . Diabetes mellitus ([place]) 03/22/2010  . Hyperlipidemia 03/22/2010    Past Surgical History:  Procedure Laterality Date  . CARPAL TUNNEL RELEASE Right 1990  approx  . COLONOSCOPY  03/04/2010  . CYSTOSCOPY W/ RETROGRADES Left 10/28/2015   Procedure: CYSTOSCOPY WITH LEFT RETROGRADE PYELOGRAM;  Surgeon: Cleon Gustin, MD;  Location: [facility];  Service:  Urology;  Laterality: Left;  . CYSTOSCOPY W/ URETERAL STENT PLACEMENT Left 10/28/2015   Procedure: CYSTOSCOPY WITH LEFT URETERAL STENT EXCHANGE;  Surgeon: Cleon Gustin, MD;  Location: [facility];  Service: Urology;  Laterality: Left;  . CYSTOSCOPY WITH RETROGRADE PYELOGRAM, URETEROSCOPY AND STENT PLACEMENT Bilateral 09/17/2015   Procedure: CYSTOSCOPY WITH BILATERAL RETROGRADE PYELOGRAM, LEFT URETEROSCOPY WITH URETERAL BIOPSY AND  STENT PLACEMENT;  Surgeon: Irine Seal, MD;  Location: [facility];  Service: Urology;  Laterality: Bilateral;  . HOLMIUM LASER APPLICATION Left 1/60/1093   Procedure: HOLMIUM LASER APPLICATION;  Surgeon: Irine Seal, MD;  Location: [facility];  Service: Urology;  Laterality: Left;  . KNEE ARTHROSCOPY Right 2008  . right total knee replacement      2009  . ROBOT ASSITED LAPAROSCOPIC NEPHROURETERECTOMY Left 12/04/2015   Procedure: XI ROBOT ASSITED LAPAROSCOPIC NEPHROURETERECTOMY;  Surgeon: Cleon Gustin, MD;  Location: [facility];  Service: Urology;  Laterality: Left;  . STONE EXTRACTION WITH BASKET Left 09/17/2015   Procedure: STONE EXTRACTION WITH BASKET;  Surgeon: Irine Seal, MD;  Location: [facility];  Service: Urology;  Laterality: Left;  . TOTAL KNEE ARTHROPLASTY Right 2009  . URETEROSCOPY  10/28/2015   Procedure: DIAGNOSTIC LEFT URETEROSCOPY;  Surgeon: Cleon Gustin, MD;  Location: [facility];  Service: Urology;;       Home  Medications    Prior to Admission medications   Medication Sig Start Date End Date Taking? Authorizing Provider  atorvastatin (LIPITOR) 10 MG tablet Take 1 tablet (10 mg total) by mouth every morning. 11/09/15   Sharion Balloon, FNP  glipiZIDE (GLUCOTROL) 5 MG tablet Take 1 tablet (5 mg total) by mouth daily before breakfast. 11/09/15   Sharion Balloon, FNP  glucose blood test strip Check BS daily and as needed 11/09/15   Sharion Balloon, FNP  HYDROcodone-acetaminophen (NORCO) 5-325 MG tablet  Take 1-2 tablets by mouth every 6 (six) hours as needed for moderate pain. 12/04/15   Debbrah Alar, PA-C  lisinopril (PRINIVIL,ZESTRIL) 10 MG tablet Take 1 tablet (10 mg total) by mouth every morning. 11/09/15   Sharion Balloon, FNP  metFORMIN (GLUCOPHAGE) 500 MG tablet TAKE (1) TABLET BY MOUTH TWICE DAILY WITH A MEAL. 11/09/15   Sharion Balloon, FNP  phenazopyridine (PYRIDIUM) 200 MG tablet Take 1 tablet (200 mg total) by mouth 3 (three) times daily as needed for pain. Patient not taking: Reported on 11/20/2015 10/28/15   Cleon Gustin, MD    Family History Family History  Problem Relation Age of Onset  . Diabetes Mother   . Heart disease Mother   . Diabetes Father     Social History Social History  Substance Use Topics  . Smoking status: Current Every Day Smoker    Packs/day: 1.50    Years: 60.00    Types: Cigarettes, Cigars  . Smokeless tobacco: Former Systems developer    Types: Chew    Quit date: 01/03/1993  . Alcohol use No     Allergies   Patient has no known allergies.   Review of Systems Review of Systems  Constitutional: Negative for activity change, appetite change, chills, fatigue and fever.  HENT: Negative.   Eyes: Negative.   Respiratory: Negative for cough, shortness of breath, wheezing and stridor.   Cardiovascular: Negative for chest pain and leg swelling.  Gastrointestinal: Positive for abdominal distention and abdominal pain. Negative for constipation, diarrhea, nausea and vomiting.  Endocrine: Negative for polyuria.  Genitourinary: Positive for decreased urine volume and difficulty urinating.  Musculoskeletal: Negative.   Skin: Negative for rash.  Allergic/Immunologic: Negative.   Neurological: Negative.   Hematological: Negative.   Psychiatric/Behavioral: Negative.      Physical Exam Updated Vital Signs BP 130/66   Pulse 70   Temp 99 F (37.2 C) (Oral)   Resp 18   Ht '5\' 7"'$  (1.702 m)   Wt 78 kg   SpO2 95%   BMI 26.94 kg/m   Physical Exam    Constitutional: He is oriented to person, place, and time. He appears well-developed and well-nourished. No distress.  HENT:  Head: Normocephalic and atraumatic.  Nose: Nose normal.  Mouth/Throat: Oropharynx is clear and moist. No oropharyngeal exudate.  Eyes: Conjunctivae are normal. Pupils are equal, round, and reactive to light. Right eye exhibits no discharge. Left eye exhibits no discharge. No scleral icterus.  Neck: Normal range of motion. Neck supple.  Cardiovascular: Normal rate, regular rhythm, normal heart sounds and intact distal pulses.   No murmur heard. Pulmonary/Chest: Breath sounds normal. No respiratory distress. He has no wheezes. He has no rales.  Abdominal: Soft. Bowel sounds are normal. He exhibits distension. There is tenderness.  Distension in the suprapubic region with overlying tenderness  Musculoskeletal: Normal range of motion. He exhibits no edema or tenderness.  Neurological: He is alert and oriented to person, place, and time.  He exhibits normal muscle tone.  Skin: Skin is warm. Capillary refill takes less than 2 seconds. No rash noted. He is not diaphoretic.  Psychiatric: He has a normal mood and affect. His behavior is normal.     ED Treatments / Results  Labs (all labs ordered are listed, but only abnormal results are displayed) Labs Reviewed  URINALYSIS, ROUTINE W REFLEX MICROSCOPIC - Abnormal; Notable for the following:       Result Value   Hgb urine dipstick LARGE (*)    All other components within normal limits    EKG  EKG Interpretation None       Radiology No results found.  Procedures Procedures (including critical care time)  Medications Ordered in ED Medications - No data to display   Initial Impression / Assessment and Plan / ED Course  I have reviewed the triage vital signs and the nursing notes.  Pertinent labs & imaging results that were available during my care of the patient were reviewed by me and considered in my  medical decision making (see chart for details).  Clinical Course     Patient presenting with decreased urine output from his foley, abdominal pain, and distension. Bladder scan revealed 850cc. RN to flush the foley for presumed blood clot or other debris.   Significant UOP after flushing of the foley. U/A sent to evaluate for possible infection.  U/A with hemoglobin, 0-5 WBCs, 0-5RBCs, less concerning for infection.  Patient with no suprapubic tenderness on examination. Discussed reasons to seek medical care. Patient stable for discharge and in agreement with the plan.     Final Clinical Impressions(s) / ED Diagnoses   Final diagnoses:  Complication, blocked Foley catheter, initial encounter [facility])    New Prescriptions New Prescriptions   No medications on file     Archie Patten, MD 12/14/15 1817    Isla Pence, MD 12/14/15 2213

## 2015-12-23 ENCOUNTER — Ambulatory Visit (HOSPITAL_COMMUNITY)
Admission: RE | Admit: 2015-12-23 | Discharge: 2015-12-23 | Disposition: A | Discharge status: Home / Self Care | Payer: Commercial Managed Care - HMO | Source: Ambulatory Visit | Attending: Urology | Admitting: Urology

## 2015-12-23 ENCOUNTER — Other Ambulatory Visit: Payer: Self-pay | Admitting: Urology

## 2015-12-23 ENCOUNTER — Ambulatory Visit (INDEPENDENT_AMBULATORY_CARE_PROVIDER_SITE_OTHER): Payer: Self-pay | Admitting: Urology

## 2015-12-23 DIAGNOSIS — C662 Malignant neoplasm of left ureter: Secondary | ICD-10-CM | POA: Diagnosis not present

## 2015-12-23 DIAGNOSIS — C669 Malignant neoplasm of unspecified ureter: Secondary | ICD-10-CM | POA: Diagnosis not present

## 2015-12-23 MED ORDER — IOTHALAMATE MEGLUMINE 17.2 % UR SOLN
250.0000 mL | Freq: Once | URETHRAL | Status: AC | PRN
  Administered 2015-12-23: 250 mL via INTRAVESICAL

## 2015-12-31 DIAGNOSIS — C669 Malignant neoplasm of unspecified ureter: Secondary | ICD-10-CM | POA: Diagnosis not present

## 2016-01-04 HISTORY — PX: MELANOMA EXCISION: SHX5266

## 2016-01-06 ENCOUNTER — Ambulatory Visit (INDEPENDENT_AMBULATORY_CARE_PROVIDER_SITE_OTHER): Payer: Self-pay | Admitting: Urology

## 2016-01-06 DIAGNOSIS — C669 Malignant neoplasm of unspecified ureter: Secondary | ICD-10-CM

## 2016-03-03 ENCOUNTER — Ambulatory Visit (INDEPENDENT_AMBULATORY_CARE_PROVIDER_SITE_OTHER): Payer: Medicare HMO | Admitting: Pharmacist

## 2016-03-03 ENCOUNTER — Encounter: Payer: Self-pay | Admitting: Pharmacist

## 2016-03-03 VITALS — BP 136/78 | HR 76 | Ht 67.0 in | Wt 178.5 lb

## 2016-03-03 DIAGNOSIS — E119 Type 2 diabetes mellitus without complications: Secondary | ICD-10-CM

## 2016-03-03 DIAGNOSIS — Z0000 Encounter for general adult medical examination without abnormal findings: Secondary | ICD-10-CM

## 2016-03-03 DIAGNOSIS — F17210 Nicotine dependence, cigarettes, uncomplicated: Secondary | ICD-10-CM

## 2016-03-03 NOTE — Patient Instructions (Addendum)
  [name] , Thank you for taking time to come for your Medicare Wellness Visit. I appreciate your ongoing commitment to your health goals. Please review the following plan we discussed and let me know if I can assist you in the future.   These are the goals we discussed:  I am sending referral for opthalmologist (eye specialist) and for CT of lung (lung cancer screening).  If you do not received a call or letter with appointment by 03/14/2016 then call our office to check on status [phone] (will want to speak to someone in referral department.  Increase intake of fruits and non starchy vegetable - goal is 5 per day   Increase non-starchy vegetables - carrots, green bean, squash, zucchini, tomatoes, onions, peppers, spinach and other green leafy vegetables, cabbage, lettuce, cucumbers, asparagus, okra (not fried), eggplant Limit sugar and processed foods (cakes, cookies, ice cream, crackers and chips) Increase fresh fruit but limit serving sizes 1/2 cup or about the size of tennis or baseball Limit red meat to no more than 1-2 times per week (serving size about the size of your palm) Choose whole grains / lean proteins - whole wheat bread, quinoa, whole grain rice (1/2 cup), fish, chicken, [place] Avoid sugar and calorie containing beverages - soda, sweet tea and juice.  Choose water or unsweetened tea instead.  Exercise - try to increase to at least 150 minutes per week   This is a list of the screening recommended for you and due dates:  Health Maintenance  Topic Date Due  . Tetanus Vaccine  01/04/2016  . Eye exam for diabetics  01/15/2016  . Flu Shot  04/02/2016*  . Hemoglobin A1C  05/29/2016  . Complete foot exam   07/26/2016  . Colon Cancer Screening  03/03/2020  .  Hepatitis C: One time screening is recommended by Center for Disease Control  (CDC) for  adults born from [birth year] through 1964.   Completed  . Pneumonia vaccines  Completed  *Topic was postponed. The date shown is  not the original due date.

## 2016-03-03 NOTE — Progress Notes (Signed)
Patient ID: Dalton Miller, male   DOB: 05-30-1945, 71 y.o.   MRN: [id number]     Subjective:   Dalton Miller is a 71 y.o. male who presents for an Initial Medicare Annual Wellness Visit.  Dalton Miller states that he feels well.  He has surgery to remove left kidney 12/2015 due to ureteral mass.  Has had no problems or health concerns since then.  Social History: Born/Raised: born in [place]; raised in [place] until about 71yo, then [place] Occupational history: In [employer] for 8 years with tour in [place]; worked as burned in shipyard and retired in 2010 Marital history: widowed - was married for 3 years 3 sons - 1 son lives with patient; 1 son lives close by; 1 sone lives in [place] Alcohol/Tobacco/Substances: tobacco use; about 1 ppd for 60 years   Current Medications (verified) Outpatient Encounter Prescriptions as of 03/03/2016  Medication Sig  . atorvastatin (LIPITOR) 10 MG tablet Take 1 tablet (10 mg total) by mouth every morning.  [name] glipiZIDE (GLUCOTROL) 5 MG tablet Take 1 tablet (5 mg total) by mouth daily before breakfast.  . glucose blood test strip Check BS daily and as needed  . lisinopril (PRINIVIL,ZESTRIL) 10 MG tablet Take 1 tablet (10 mg total) by mouth every morning.  . metFORMIN (GLUCOPHAGE) 500 MG tablet TAKE (1) TABLET BY MOUTH TWICE DAILY WITH A MEAL.  . [DISCONTINUED] HYDROcodone-acetaminophen (NORCO) 5-325 MG tablet Take 1-2 tablets by mouth every 6 (six) hours as needed for moderate pain. (Patient not taking: Reported on 03/03/2016)  . [DISCONTINUED] phenazopyridine (PYRIDIUM) 200 MG tablet Take 1 tablet (200 mg total) by mouth 3 (three) times daily as needed for pain. (Patient not taking: Reported on 03/03/2016)   No facility-administered encounter medications on file as of 03/03/2016.     Allergies (verified) Patient has no known allergies.   History: Past Medical History:  Diagnosis Date  . Acquired renal cyst of right kidney   . Cancer ([place])    renal cell   . Erectile  dysfunction   . Heart murmur    CHILDHOOD  . Hematuria    left ureteral bleeding  . History of cellulitis    2012 left wrist  . History of kidney stones   . Hyperlipidemia   . Hypertension   . Nephrolithiasis    bilateral non-obstructive per ct 07-02-2015  . Type 2 diabetes mellitus ([place])   . Wears glasses    Past Surgical History:  Procedure Laterality Date  . CARPAL TUNNEL RELEASE Right 1990  approx  . COLONOSCOPY  03/04/2010  . CYSTOSCOPY W/ RETROGRADES Left 10/28/2015   Procedure: CYSTOSCOPY WITH LEFT RETROGRADE PYELOGRAM;  Surgeon: Cleon Gustin, MD;  Location: [facility];  Service: Urology;  Laterality: Left;  . CYSTOSCOPY W/ URETERAL STENT PLACEMENT Left 10/28/2015   Procedure: CYSTOSCOPY WITH LEFT URETERAL STENT EXCHANGE;  Surgeon: Cleon Gustin, MD;  Location: [facility];  Service: Urology;  Laterality: Left;  . CYSTOSCOPY WITH RETROGRADE PYELOGRAM, URETEROSCOPY AND STENT PLACEMENT Bilateral 09/17/2015   Procedure: CYSTOSCOPY WITH BILATERAL RETROGRADE PYELOGRAM, LEFT URETEROSCOPY WITH URETERAL BIOPSY AND  STENT PLACEMENT;  Surgeon: Irine Seal, MD;  Location: [facility];  Service: Urology;  Laterality: Bilateral;  . HOLMIUM LASER APPLICATION Left 06/29/348   Procedure: HOLMIUM LASER APPLICATION;  Surgeon: Irine Seal, MD;  Location: [facility];  Service: Urology;  Laterality: Left;  . KNEE ARTHROSCOPY Right 2008  . right total knee replacement  Right  2009  . ROBOT ASSITED LAPAROSCOPIC NEPHROURETERECTOMY Left 12/04/2015   Procedure: XI ROBOT ASSITED LAPAROSCOPIC NEPHROURETERECTOMY;  Surgeon: Cleon Gustin, MD;  Location: [facility];  Service: Urology;  Laterality: Left;  . STONE EXTRACTION WITH BASKET Left 09/17/2015   Procedure: STONE EXTRACTION WITH BASKET;  Surgeon: Irine Seal, MD;  [facility];  Service: Urology;  Laterality: Left;  . TOTAL KNEE ARTHROPLASTY Right 2009  . URETEROSCOPY  10/28/2015   Procedure:  DIAGNOSTIC LEFT URETEROSCOPY;  Surgeon: Cleon Gustin, MD;  Location: [facility];  Service: Urology;;   Family History  Problem Relation Age of Onset  . Diabetes Mother   . Heart disease Mother   . Stroke Mother   . CAD Mother   . Diabetes Father   . Congestive Heart Failure Father   . Cancer Brother   . Heart disease Son   . COPD Son   . Emphysema Son   . Heart attack Brother    Social History   Occupational History  . Not on file.   Social History Main Topics  . Smoking status: Current Every Day Smoker    Packs/day: 1.50    Years: 60.00    Types: Cigarettes, Cigars  . Smokeless tobacco: Former Systems developer    Types: Chew    Quit date: 01/03/1993  . Alcohol use No  . Drug use: No  . Sexual activity: No    Do you feel safe at home?  Yes  Dietary issues and exercise activities discussed: Current Exercise Habits: The patient does not participate in regular exercise at present  Current Dietary habits:  Son cooks most meals Patient eats 1-2 meals per day and has 1-2 snacks per day He is not following low CHO diet and reports eating cakes and nabs for snacks.  Cardiac Risk Factors include: advanced age (>46mn, >>40women);diabetes mellitus;dyslipidemia;family history of premature cardiovascular disease;hypertension;male gender;smoking/ tobacco exposure  Objective:    Today's Vitals   03/03/16 1445 03/03/16 1512  BP: 140/80 136/78  Pulse: 76   Weight: 178 lb 8 oz (81 kg)   Height: '5\' 7"'$  (1.702 m)   PainSc: 0-No pain    Body mass index is 27.96 kg/m.   Activities of Daily Living In your present state of health, do you have any difficulty performing the following activities: 03/03/2016 12/04/2015  Hearing? N N  Vision? N N  Difficulty concentrating or making decisions? N N  Walking or climbing stairs? N Y  Dressing or bathing? N N  Doing errands, shopping? N N  Preparing Food and eating ? N -  Using the Toilet? N -  In the past six months, have you accidently leaked  urine? N -  Do you have problems with loss of bowel control? N -  Managing your Medications? N -  Managing your Finances? N -  Housekeeping or managing your Housekeeping? N -  Some recent data might be hidden     Depression Screen PHQ 2/9 Scores 03/03/2016 07/27/2015 10/21/2014 02/14/2014  PHQ - 2 Score 1 0 0 0     Fall Risk Fall Risk  03/03/2016 07/27/2015 10/21/2014 02/14/2014 01/10/2013  Falls in the past year? No No No No No    Cognitive Function: MMSE - Mini Mental State Exam 03/03/2016  Orientation to time 5  Orientation to Place 5  Registration 3  Attention/ Calculation 5  Recall 2  Language- name 2 objects 2  Language- repeat 1  Language- follow 3 step command 3  Language- read & follow direction 1  Write a sentence 1  Copy design 1  Total score 29    Immunizations and Health Maintenance Immunization History  Administered Date(s) Administered  . Influenza Whole 10/04/2007  . Pneumococcal Conjugate-13 05/26/2014  . Pneumococcal Polysaccharide-23 10/04/2007, 07/27/2015  . Tdap 01/03/2006   Health Maintenance Due  Topic Date Due  . TETANUS/TDAP  01/04/2016  . OPHTHALMOLOGY EXAM  01/15/2016    Patient Care Team: Sharion Balloon, FNP as PCP - General (Nurse Practitioner) Cleon Gustin, MD as Consulting Physician (Urology)  Indicate any recent Medical Services you may have received from other than [facility] providers in the past year (date may be approximate).    Assessment:    Annual Wellness Visit    Screening Tests Health Maintenance  Topic Date Due  . TETANUS/TDAP  01/04/2016  . OPHTHALMOLOGY EXAM  01/15/2016  . INFLUENZA VACCINE  04/02/2016 (Originally 08/04/2015)  . HEMOGLOBIN A1C  05/29/2016  . FOOT EXAM  07/26/2016  . COLONOSCOPY  03/03/2020  . Hepatitis C Screening  Completed  . PNA vac Low Risk Adult  Completed        Plan:   During the course of the visit Dalton Miller was educated and counseled about the following appropriate screening and  preventive services:   Vaccines to include Pneumoccal, Influenza,  Td, Zostavax - due influenza vaccine but patient declined; also due Tdap but patient also declined due to cost.  Colorectal cancer screening - UTD  Cardiovascular disease screening - EKG 09/2015  Lipids are at goal and patient is taking statin; BP at goal  Diabetes - last A1c was at goal  Glaucoma screening / Diabetic Eye Exam - referral sent  Nutrition counseling - discussed increasing non starchy vegetables and fruits - goal is 5 per day  Prostate cancer screening - UTD  Smoking cessation counseling - patient declined but did sent referral for Lung cancer screening / CT scan  Advanced Directives - discussed and information provided  Physical Activity - discussed - recommended increasing gradually to goal of 150 minutes per week  Reminded patient about getting urine albumin but he was unable to void today - will do when he sees PCP in 2 weeks.   Goals    None       Patient Instructions (the written plan) were given to the patient.   Cherre Robins, PharmD   03/03/2016

## 2016-03-16 ENCOUNTER — Ambulatory Visit: Payer: Commercial Managed Care - HMO | Admitting: Family

## 2016-03-21 ENCOUNTER — Ambulatory Visit (HOSPITAL_COMMUNITY): Payer: Medicare HMO

## 2016-03-22 ENCOUNTER — Ambulatory Visit (INDEPENDENT_AMBULATORY_CARE_PROVIDER_SITE_OTHER): Payer: Medicare HMO | Admitting: Family

## 2016-03-22 ENCOUNTER — Encounter: Payer: Self-pay | Admitting: Family

## 2016-03-22 VITALS — BP 135/81 | HR 64 | Temp 97.1°F | Ht 67.0 in | Wt 181.4 lb

## 2016-03-22 DIAGNOSIS — N529 Male erectile dysfunction, unspecified: Secondary | ICD-10-CM | POA: Diagnosis not present

## 2016-03-22 DIAGNOSIS — I10 Essential (primary) hypertension: Secondary | ICD-10-CM

## 2016-03-22 DIAGNOSIS — E1165 Type 2 diabetes mellitus with hyperglycemia: Secondary | ICD-10-CM

## 2016-03-22 DIAGNOSIS — E785 Hyperlipidemia, unspecified: Secondary | ICD-10-CM

## 2016-03-22 DIAGNOSIS — Z905 Acquired absence of kidney: Secondary | ICD-10-CM

## 2016-03-22 DIAGNOSIS — E663 Overweight: Secondary | ICD-10-CM | POA: Diagnosis not present

## 2016-03-22 DIAGNOSIS — Z720 Tobacco use: Secondary | ICD-10-CM

## 2016-03-22 DIAGNOSIS — E559 Vitamin D deficiency, unspecified: Secondary | ICD-10-CM

## 2016-03-22 LAB — BAYER DCA HB A1C WAIVED: HB A1C: 5.8 % (ref <7.0)

## 2016-03-22 MED ORDER — ASPIRIN EC 81 MG PO TBEC: 81.0000 mg | DELAYED_RELEASE_TABLET | Freq: Every day | ORAL | 1 refills | Status: DC

## 2016-03-22 NOTE — Progress Notes (Signed)
Subjective:    Patient ID: Dalton Miller, male    DOB: Feb 01, 1945, 71 y.o.   MRN: [id number]  Pt presents to the office today for chronic follow up.  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  Has follow up appt with Urologists on 04/06/16. Diabetes  He presents for his follow-up diabetic visit. He has type 2 diabetes mellitus. His disease course has been fluctuating. There are no hypoglycemic associated symptoms. Pertinent negatives for hypoglycemia include no confusion, dizziness, headaches or mood changes. Associated symptoms include foot paresthesias (At times). Pertinent negatives for diabetes include no foot ulcerations and no visual change. There are no hypoglycemic complications. Symptoms are stable. Diabetic complications include peripheral neuropathy. Pertinent negatives for diabetic complications include no CVA, heart disease or nephropathy. Risk factors for coronary artery disease include dyslipidemia, male sex and tobacco exposure. Current diabetic treatment includes oral agent (dual therapy). He is compliant with treatment all of the time. He is following a generally healthy diet. His breakfast blood glucose range is generally 110-130 mg/dl. ( ) An ACE inhibitor/angiotensin II receptor blocker is being taken. Eye exam is current.  Hypertension  This is a chronic problem. The current episode started more than 1 year ago. The problem has been resolved since onset. The problem is controlled. Pertinent negatives include no anxiety, headaches, palpitations, peripheral edema or shortness of breath. Risk factors for coronary artery disease include dyslipidemia, diabetes mellitus, male gender, family history and smoking/tobacco exposure. Past treatments include ACE inhibitors. The current treatment provides significant improvement. There is no history of kidney disease, CAD/MI, CVA or heart failure. There is no history of sleep apnea or a thyroid problem.  Hyperlipidemia    This is a chronic problem. The current episode started more than 1 year ago. The problem is controlled. Recent lipid tests were reviewed and are normal. Exacerbating diseases include obesity. He has no history of hypothyroidism. Factors aggravating his hyperlipidemia include smoking and fatty foods. Pertinent negatives include no leg pain, myalgias or shortness of breath. Current antihyperlipidemic treatment includes statins. The current treatment provides moderate improvement of lipids. Risk factors for coronary artery disease include dyslipidemia, diabetes mellitus, family history, male sex and a sedentary lifestyle.      Review of Systems  Constitutional: Negative.   HENT: Negative.   Respiratory: Negative.  Negative for shortness of breath.   Cardiovascular: Negative.  Negative for palpitations.  Gastrointestinal: Negative.   Endocrine: Negative.   Genitourinary: Negative.   Musculoskeletal: Negative.  Negative for myalgias.  Neurological: Negative.  Negative for dizziness and headaches.  Hematological: Negative.   Psychiatric/Behavioral: Negative.  Negative for confusion.  All other systems reviewed and are negative.      Objective:   Physical Exam  Constitutional: He is oriented to person, place, and time. He appears well-developed and well-nourished. No distress.  HENT:  Head: Normocephalic.  Right Ear: External ear normal.  Left Ear: External ear normal.  Nose: Nose normal.  Mouth/Throat: Oropharynx is clear and moist.  Eyes: Pupils are equal, round, and reactive to light. Right eye exhibits no discharge. Left eye exhibits no discharge.  Neck: Normal range of motion. Neck supple. No thyromegaly present.  Cardiovascular: Normal rate, regular rhythm, normal heart sounds and intact distal pulses.   No murmur heard. Pulmonary/Chest: Effort normal and breath sounds normal. No respiratory distress. He has no wheezes.  Abdominal: Soft. Bowel sounds are normal. He exhibits no  distension. There is no tenderness.  Musculoskeletal: Normal range of  motion. He exhibits no edema or tenderness.  Neurological: He is alert and oriented to person, place, and time.  Skin: Skin is warm and dry. No rash noted. No erythema.  Psychiatric: He has a normal mood and affect. His behavior is normal. Judgment and thought content normal.  Vitals reviewed.     BP 135/81   Pulse 64   Temp 97.1 F (36.2 C) (Oral)   Ht _0  (1.702 m)   Wt 181 lb 6.4 oz (82.3 kg)   BMI 28.41 kg/m      Assessment & Plan:  1. Essential hypertension - CMP14+EGFR  2. Type 2 diabetes mellitus with hyperglycemia, without long-term current use of insulin (HCC) - CMP14+EGFR - Microalbumin / creatinine urine ratio - Bayer DCA Hb A1c Waived  3. Erectile dysfunction, unspecified erectile dysfunction type - CMP14+EGFR  4. Hyperlipidemia, unspecified hyperlipidemia type - CMP14+EGFR - Lipid panel  5. Overweight (BMI 25.0-29.9) - CMP14+EGFR  6. Tobacco user - CMP14+EGFR  7. Vitamin D deficiency - CMP14+EGFR  8. Single kidney   Continue all meds Labs pending Health Maintenance reviewed Diet and exercise encouraged RTO 4 months   Evelina Dun, FNP

## 2016-03-22 NOTE — Patient Instructions (Signed)
 Health Maintenance, Male A healthy lifestyle and preventive care is important for your health and wellness. Ask your health care provider about what schedule of regular examinations is right for you. What should I know about weight and diet?  Eat a Healthy Diet  Eat plenty of vegetables, fruits, whole grains, low-fat dairy products, and lean protein.  Do not eat a lot of foods high in solid fats, added sugars, or salt. Maintain a Healthy Weight  Regular exercise can help you achieve or maintain a healthy weight. You should:  Do at least 150 minutes of exercise each week. The exercise should increase your heart rate and make you sweat (moderate-intensity exercise).  Do strength-training exercises at least twice a week. Watch Your Levels of Cholesterol and Blood Lipids  Have your blood tested for lipids and cholesterol every 5 years starting at 70 years of age. If you are at high risk for heart disease, you should start having your blood tested when you are 70 years old. You may need to have your cholesterol levels checked more often if:  Your lipid or cholesterol levels are high.  You are older than 70 years of age.  You are at high risk for heart disease. What should I know about cancer screening? Many types of cancers can be detected early and may often be prevented. Lung Cancer  You should be screened every year for lung cancer if:  You are a current smoker who has smoked for at least 30 years.  You are a former smoker who has quit within the past 15 years.  Talk to your health care provider about your screening options, when you should start screening, and how often you should be screened. Colorectal Cancer  Routine colorectal cancer screening usually begins at 70 years of age and should be repeated every 5-10 years until you are 70 years old. You may need to be screened more often if early forms of precancerous polyps or small growths are found. Your health care provider  may recommend screening at an earlier age if you have risk factors for colon cancer.  Your health care provider may recommend using home test kits to check for hidden blood in the stool.  A small camera at the end of a tube can be used to examine your colon (sigmoidoscopy or colonoscopy). This checks for the earliest forms of colorectal cancer. Prostate and Testicular Cancer  Depending on your age and overall health, your health care provider may do certain tests to screen for prostate and testicular cancer.  Talk to your health care provider about any symptoms or concerns you have about testicular or prostate cancer. Skin Cancer  Check your skin from head to toe regularly.  Tell your health care provider about any new moles or changes in moles, especially if:  There is a change in a mole's size, shape, or color.  You have a mole that is larger than a pencil eraser.  Always use sunscreen. Apply sunscreen liberally and repeat throughout the day.  Protect yourself by wearing long sleeves, pants, a wide-brimmed hat, and sunglasses when outside. What should I know about heart disease, diabetes, and high blood pressure?  If you are 18-39 years of age, have your blood pressure checked every 3-5 years. If you are 40 years of age or older, have your blood pressure checked every year. You should have your blood pressure measured twice-once when you are at a hospital or clinic, and once when you are not at   a hospital or clinic. Record the average of the two measurements. To check your blood pressure when you are not at a hospital or clinic, you can use:  An automated blood pressure machine at a pharmacy.  A home blood pressure monitor.  Talk to your health care provider about your target blood pressure.  If you are between 45-79 years old, ask your health care provider if you should take aspirin to prevent heart disease.  Have regular diabetes screenings by checking your fasting blood sugar  level.  If you are at a normal weight and have a low risk for diabetes, have this test once every three years after the age of 45.  If you are overweight and have a high risk for diabetes, consider being tested at a younger age or more often.  A one-time screening for abdominal aortic aneurysm (AAA) by ultrasound is recommended for men aged 65-75 years who are current or former smokers. What should I know about preventing infection? Hepatitis B  If you have a higher risk for hepatitis B, you should be screened for this virus. Talk with your health care provider to find out if you are at risk for hepatitis B infection. Hepatitis C  Blood testing is recommended for:  Everyone born from 1945 through 1965.  Anyone with known risk factors for hepatitis C. Sexually Transmitted Diseases (STDs)  You should be screened each year for STDs including gonorrhea and chlamydia if:  You are sexually active and are younger than 70 years of age.  You are older than 70 years of age and your health care provider tells you that you are at risk for this type of infection.  Your sexual activity has changed since you were last screened and you are at an increased risk for chlamydia or gonorrhea. Ask your health care provider if you are at risk.  Talk with your health care provider about whether you are at high risk of being infected with HIV. Your health care provider may recommend a prescription medicine to help prevent HIV infection. What else can I do?  Schedule regular health, dental, and eye exams.  Stay current with your vaccines (immunizations).  Do not use any tobacco products, such as cigarettes, chewing tobacco, and e-cigarettes. If you need help quitting, ask your health care provider.  Limit alcohol intake to no more than 2 drinks per day. One drink equals 12 ounces of beer, 5 ounces of wine, or 1 ounces of hard liquor.  Do not use street drugs.  Do not share needles.  Ask your health  care provider for help if you need support or information about quitting drugs.  Tell your health care provider if you often feel depressed.  Tell your health care provider if you have ever been abused or do not feel safe at home. This information is not intended to replace advice given to you by your health care provider. Make sure you discuss any questions you have with your health care provider. Document Released: 06/18/2007 Document Revised: 08/19/2015 Document Reviewed: 09/23/2014 Elsevier Interactive Patient Education  2017 [employer]  

## 2016-03-23 LAB — MICROALBUMIN / CREATININE URINE RATIO
CREATININE, UR: 57.3 mg/dL
MICROALB/CREAT RATIO: 24.6 mg/g{creat} (ref 0.0–30.0)
Microalbumin, Urine: 14.1 ug/mL

## 2016-03-23 LAB — CMP14+EGFR
A/G RATIO: 1.6 (ref 1.2–2.2)
ALT: 7 IU/L (ref 0–44)
AST: 10 IU/L (ref 0–40)
Albumin: 4.2 g/dL (ref 3.5–4.8)
Alkaline Phosphatase: 94 IU/L (ref 39–117)
BUN / CREAT RATIO: 11 (ref 10–24)
BUN: 14 mg/dL (ref 8–27)
CHLORIDE: 105 mmol/L (ref 96–106)
CO2: 20 mmol/L (ref 18–29)
Calcium: 9.2 mg/dL (ref 8.6–10.2)
Creatinine, Ser: 1.28 mg/dL — ABNORMAL HIGH (ref 0.76–1.27)
GFR, EST AFRICAN AMERICAN: 65 mL/min/{1.73_m2} (ref >59)
GFR, EST NON AFRICAN AMERICAN: 56 mL/min/{1.73_m2} — AB (ref >59)
GLUCOSE: 81 mg/dL (ref 65–99)
Globulin, Total: 2.6 g/dL (ref 1.5–4.5)
POTASSIUM: 5 mmol/L (ref 3.5–5.2)
Sodium: 142 mmol/L (ref 134–144)
Total Protein: 6.8 g/dL (ref 6.0–8.5)

## 2016-03-23 LAB — LIPID PANEL
CHOL/HDL RATIO: 2.8 ratio (ref 0.0–5.0)
Cholesterol, Total: 134 mg/dL (ref 100–199)
HDL: 48 mg/dL (ref >39)
LDL Calculated: 66 mg/dL (ref 0–99)
Triglycerides: 102 mg/dL (ref 0–149)
VLDL Cholesterol Cal: 20 mg/dL (ref 5–40)

## 2016-03-29 ENCOUNTER — Other Ambulatory Visit: Payer: Self-pay | Admitting: Urology

## 2016-03-29 ENCOUNTER — Ambulatory Visit (HOSPITAL_COMMUNITY)
Admission: RE | Admit: 2016-03-29 | Discharge: 2016-03-29 | Disposition: A | Discharge status: Home / Self Care | Payer: Medicare HMO | Source: Ambulatory Visit | Attending: Urology | Admitting: Urology

## 2016-03-29 DIAGNOSIS — C669 Malignant neoplasm of unspecified ureter: Secondary | ICD-10-CM

## 2016-03-29 DIAGNOSIS — C649 Malignant neoplasm of unspecified kidney, except renal pelvis: Secondary | ICD-10-CM | POA: Diagnosis not present

## 2016-04-13 DIAGNOSIS — H5200 Hypermetropia, unspecified eye: Secondary | ICD-10-CM | POA: Diagnosis not present

## 2016-04-13 DIAGNOSIS — Z0100 Encounter for examination of eyes and vision without abnormal findings: Secondary | ICD-10-CM | POA: Diagnosis not present

## 2016-04-13 DIAGNOSIS — H2513 Age-related nuclear cataract, bilateral: Secondary | ICD-10-CM | POA: Diagnosis not present

## 2016-05-18 ENCOUNTER — Ambulatory Visit (INDEPENDENT_AMBULATORY_CARE_PROVIDER_SITE_OTHER): Payer: Medicare HMO | Admitting: Urology

## 2016-05-18 DIAGNOSIS — C669 Malignant neoplasm of unspecified ureter: Secondary | ICD-10-CM | POA: Diagnosis not present

## 2016-06-03 ENCOUNTER — Telehealth: Payer: Self-pay | Admitting: Family

## 2016-06-03 DIAGNOSIS — I10 Essential (primary) hypertension: Secondary | ICD-10-CM

## 2016-06-03 DIAGNOSIS — E782 Mixed hyperlipidemia: Secondary | ICD-10-CM

## 2016-06-03 DIAGNOSIS — E1165 Type 2 diabetes mellitus with hyperglycemia: Secondary | ICD-10-CM

## 2016-06-03 MED ORDER — METFORMIN HCL 500 MG PO TABS: ORAL_TABLET | ORAL | 1 refills | Status: DC

## 2016-06-03 MED ORDER — ATORVASTATIN CALCIUM 10 MG PO TABS: 10.0000 mg | ORAL_TABLET | Freq: Every morning | ORAL | 1 refills | Status: DC

## 2016-06-03 MED ORDER — GLIPIZIDE 5 MG PO TABS: 5.0000 mg | ORAL_TABLET | Freq: Every day | ORAL | 1 refills | Status: DC

## 2016-06-03 MED ORDER — LISINOPRIL 10 MG PO TABS: 10.0000 mg | ORAL_TABLET | Freq: Every morning | ORAL | 1 refills | Status: DC

## 2016-06-03 NOTE — Telephone Encounter (Signed)
done

## 2016-06-03 NOTE — Telephone Encounter (Signed)
What is the name of the medication? Metformin 500mg , atorvastatin 10mg , glipizide 5mg , and lisinopril 10mg   Have you contacted your pharmacy to request a refill? no  Which pharmacy would you like this sent to? Mail order pharmacy.    Patient notified that their request is being sent to the clinical staff for review and that they should receive a call once it is complete. If they do not receive a call within 24 hours they can check with their pharmacy or our office.

## 2016-11-15 ENCOUNTER — Other Ambulatory Visit: Payer: Self-pay | Admitting: Family

## 2016-11-15 DIAGNOSIS — E1165 Type 2 diabetes mellitus with hyperglycemia: Secondary | ICD-10-CM

## 2016-11-15 DIAGNOSIS — I10 Essential (primary) hypertension: Secondary | ICD-10-CM

## 2016-11-15 DIAGNOSIS — E782 Mixed hyperlipidemia: Secondary | ICD-10-CM

## 2016-11-15 MED ORDER — GLUCOSE BLOOD VI STRP: ORAL_STRIP | 2 refills | Status: DC

## 2016-11-15 MED ORDER — LISINOPRIL 10 MG PO TABS: 10.0000 mg | ORAL_TABLET | Freq: Every morning | ORAL | 1 refills | Status: DC

## 2016-11-15 MED ORDER — ATORVASTATIN CALCIUM 10 MG PO TABS: 10.0000 mg | ORAL_TABLET | Freq: Every morning | ORAL | 1 refills | Status: DC

## 2016-11-15 MED ORDER — METFORMIN HCL 500 MG PO TABS: ORAL_TABLET | ORAL | 1 refills | Status: DC

## 2016-11-15 MED ORDER — GLIPIZIDE 5 MG PO TABS: 5.0000 mg | ORAL_TABLET | Freq: Every day | ORAL | 1 refills | Status: DC

## 2016-11-15 NOTE — Telephone Encounter (Signed)
Patient NTBS for follow up and lab work  

## 2016-11-15 NOTE — Telephone Encounter (Signed)
Patient lase seen 03/22/2016

## 2016-11-15 NOTE — Telephone Encounter (Signed)
What is the name of the medication? metFORMIN (GLUCOPHAGE) 500 MG tablet atorvastatin (LIPITOR) 10 MG tablet glipiZIDE (GLUCOTROL) 5 MG tablet glucose blood test strip lisinopril (PRINIVIL,ZESTRIL) 10 MG tablet  Have you contacted your pharmacy to request a refill? No  Which pharmacy would you like this sent to? [facility]   Patient notified that their request is being sent to the clinical staff for review and that they should receive a call once it is complete. If they do not receive a call within 24 hours they can check with their pharmacy or our office.

## 2016-11-21 DIAGNOSIS — C669 Malignant neoplasm of unspecified ureter: Secondary | ICD-10-CM | POA: Diagnosis not present

## 2016-11-23 ENCOUNTER — Ambulatory Visit: Payer: Medicare HMO | Admitting: Urology

## 2016-11-23 DIAGNOSIS — C669 Malignant neoplasm of unspecified ureter: Secondary | ICD-10-CM | POA: Diagnosis not present

## 2017-03-01 ENCOUNTER — Telehealth: Payer: Self-pay | Admitting: Family

## 2017-03-01 DIAGNOSIS — I10 Essential (primary) hypertension: Secondary | ICD-10-CM

## 2017-03-01 DIAGNOSIS — E782 Mixed hyperlipidemia: Secondary | ICD-10-CM

## 2017-03-01 DIAGNOSIS — E1165 Type 2 diabetes mellitus with hyperglycemia: Secondary | ICD-10-CM

## 2017-03-01 MED ORDER — GLIPIZIDE 5 MG PO TABS
5.0000 mg | ORAL_TABLET | Freq: Every day | ORAL | 0 refills | Status: DC
Start: 2017-03-01 — End: 2017-03-07

## 2017-03-01 MED ORDER — METFORMIN HCL 500 MG PO TABS: ORAL_TABLET | ORAL | 0 refills | Status: DC

## 2017-03-01 MED ORDER — LISINOPRIL 10 MG PO TABS: 10.0000 mg | ORAL_TABLET | Freq: Every morning | ORAL | 0 refills | Status: DC

## 2017-03-01 MED ORDER — ATORVASTATIN CALCIUM 10 MG PO TABS: 10.0000 mg | ORAL_TABLET | Freq: Every morning | ORAL | 0 refills | Status: DC

## 2017-03-01 NOTE — Telephone Encounter (Signed)
Pt is needing a refill on glipizied, lipitor,metformin, lisinopril all to [facility], (he will use this pharmacy from now on)

## 2017-03-01 NOTE — Telephone Encounter (Signed)
Pt aware 30 day refill sent to pharmacy appt made for 03/07/17

## 2017-03-07 ENCOUNTER — Ambulatory Visit: Payer: Medicare HMO | Admitting: Family

## 2017-03-07 ENCOUNTER — Telehealth: Payer: Self-pay | Admitting: Family

## 2017-03-07 DIAGNOSIS — I10 Essential (primary) hypertension: Secondary | ICD-10-CM

## 2017-03-07 DIAGNOSIS — E782 Mixed hyperlipidemia: Secondary | ICD-10-CM

## 2017-03-07 DIAGNOSIS — E1165 Type 2 diabetes mellitus with hyperglycemia: Secondary | ICD-10-CM

## 2017-03-07 MED ORDER — GLIPIZIDE 5 MG PO TABS: 5.0000 mg | ORAL_TABLET | Freq: Every day | ORAL | 0 refills | Status: DC

## 2017-03-07 MED ORDER — ATORVASTATIN CALCIUM 10 MG PO TABS: 10.0000 mg | ORAL_TABLET | Freq: Every morning | ORAL | 0 refills | Status: DC

## 2017-03-07 MED ORDER — LISINOPRIL 10 MG PO TABS: 10.0000 mg | ORAL_TABLET | Freq: Every morning | ORAL | 0 refills | Status: DC

## 2017-03-07 MED ORDER — METFORMIN HCL 500 MG PO TABS: ORAL_TABLET | ORAL | 0 refills | Status: DC

## 2017-03-07 NOTE — Telephone Encounter (Signed)
D/t cost pt needed Rx's sent to [facility] Rxs sent to pharmacy

## 2017-03-10 ENCOUNTER — Ambulatory Visit: Payer: Self-pay | Admitting: Family

## 2017-03-15 ENCOUNTER — Encounter: Payer: Self-pay | Admitting: Family

## 2017-03-29 ENCOUNTER — Other Ambulatory Visit: Payer: Self-pay | Admitting: Family

## 2017-03-29 DIAGNOSIS — E1165 Type 2 diabetes mellitus with hyperglycemia: Secondary | ICD-10-CM

## 2017-03-29 DIAGNOSIS — I10 Essential (primary) hypertension: Secondary | ICD-10-CM

## 2017-03-29 DIAGNOSIS — E782 Mixed hyperlipidemia: Secondary | ICD-10-CM

## 2017-04-05 LAB — HM DIABETES EYE EXAM

## 2017-04-12 ENCOUNTER — Ambulatory Visit (INDEPENDENT_AMBULATORY_CARE_PROVIDER_SITE_OTHER): Payer: Medicare HMO | Admitting: Family

## 2017-04-12 VITALS — BP 136/76 | HR 51 | Temp 97.0°F | Ht 67.0 in | Wt 172.0 lb

## 2017-04-12 DIAGNOSIS — Z0000 Encounter for general adult medical examination without abnormal findings: Secondary | ICD-10-CM

## 2017-04-12 NOTE — Progress Notes (Signed)
Subjective:   Dalton Miller is a 72 y.o. male who presents for an Initial Medicare Annual Wellness Visit. He lives in [place] and has 3 sons. One son lives with him and the other just down the road and the 3rd lives in [place]. He is a widow. He is currently retired and did so in 2010. 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 He also sees the [place] on a regular basis. He completed 8th grade while in school. He has a dog at home. He had his left kidney removed and a melanoma to his skin removed this past year. He doesn't have any regular hobbies or an exercise routine. He is very active and states that he just does what he wants to do. He does not currently have an advanced directive but does have information at home and he states that he needs to take care of that.   Review of Systems   Cardiac Risk Factors include: advanced age (>43men, >55 women);diabetes mellitus;dyslipidemia;male gender;smoking/ tobacco exposure    Objective:    Today's Vitals   04/12/17 0817  BP: 136/76  Pulse: (!) 51  Temp: (!) 97 F (36.1 C)  TempSrc: Oral  Weight: 172 lb (78 kg)  Height: 5\' 7"  (1.702 m)   Body mass index is 26.94 kg/m.  Advanced Directives 04/12/2017 03/03/2016 12/14/2015 12/04/2015 11/30/2015 10/28/2015 10/23/2015  Does Patient Have a Medical Advance Directive? No No No No No No No  Would patient like information on creating a medical advance directive? No - Patient declined Yes (MAU/Ambulatory/Procedural Areas - Information given) - No - Patient declined No - Patient declined No - patient declined information -    Current Medications (verified) Outpatient Encounter Medications as of 04/12/2017  Medication Sig  . aspirin EC 81 MG tablet Take 1 tablet (81 mg total) by mouth daily.  [name] atorvastatin (LIPITOR) 10 MG tablet TAKE 1 TABLET BY MOUTH EVERY DAY IN THE MORNING  .  glipiZIDE (GLUCOTROL) 5 MG tablet TAKE 1 TABLET (5 MG TOTAL) BY MOUTH DAILY BEFORE BREAKFAST.  [name] glucose blood test strip Check BS daily and as needed  . lisinopril (PRINIVIL,ZESTRIL) 10 MG tablet TAKE 1 TABLET BY MOUTH EVERY DAY IN THE MORNING  . metFORMIN (GLUCOPHAGE) 500 MG tablet TAKE (1) TABLET BY MOUTH TWICE DAILY WITH A MEAL.   No facility-administered encounter medications on file as of 04/12/2017.     Allergies (verified) Patient has no known allergies.   History: Past Medical History:  Diagnosis Date  . Acquired renal cyst of right kidney   . Cancer ([place])    renal cell   . Erectile dysfunction   . Heart murmur    CHILDHOOD  . Hematuria    left ureteral bleeding  . History of cellulitis    2012 left wrist  . History of kidney stones   . Hyperlipidemia   . Nephrolithiasis    bilateral non-obstructive per ct 07-02-2015  . Type 2 diabetes mellitus ([place])   . Wears glasses    Past Surgical History:  Procedure Laterality Date  . CARPAL TUNNEL RELEASE Right 1990  approx  . COLONOSCOPY  03/04/2010  . CYSTOSCOPY W/ RETROGRADES Left 10/28/2015   Procedure: CYSTOSCOPY WITH LEFT RETROGRADE PYELOGRAM;  Surgeon: Cleon Gustin, MD;  Location: [facility];  Service: Urology;  Laterality: Left;  . CYSTOSCOPY W/ URETERAL STENT  PLACEMENT Left 10/28/2015   Procedure: CYSTOSCOPY WITH LEFT URETERAL STENT EXCHANGE;  Surgeon: Cleon Gustin, MD;  Location: [facility];  Service: Urology;  Laterality: Left;  . CYSTOSCOPY WITH RETROGRADE PYELOGRAM, URETEROSCOPY AND STENT PLACEMENT Bilateral 09/17/2015   Procedure: CYSTOSCOPY WITH BILATERAL RETROGRADE PYELOGRAM, LEFT URETEROSCOPY WITH URETERAL BIOPSY AND  STENT PLACEMENT;  Surgeon: Irine Seal, MD;  Location: [facility];  Service: Urology;  Laterality: Bilateral;  . HOLMIUM LASER APPLICATION Left 06/22/5091   Procedure: HOLMIUM LASER APPLICATION;  Surgeon: Irine Seal, MD;  Location: [facility];  Service: Urology;   Laterality: Left;  . KNEE ARTHROSCOPY Right 2008  . MELANOMA EXCISION  2018   Left arm  . right total knee replacement  Right    2009  . ROBOT ASSITED LAPAROSCOPIC NEPHROURETERECTOMY Left 12/04/2015   Procedure: XI ROBOT ASSITED LAPAROSCOPIC NEPHROURETERECTOMY;  Surgeon: Cleon Gustin, MD;  Location: [facility];  Service: Urology;  Laterality: Left;  . STONE EXTRACTION WITH BASKET Left 09/17/2015   Procedure: STONE EXTRACTION WITH BASKET;  Surgeon: Irine Seal, MD;  Location: [facility];  Service: Urology;  Laterality: Left;  . TOTAL KNEE ARTHROPLASTY Right 2009  . URETEROSCOPY  10/28/2015   Procedure: DIAGNOSTIC LEFT URETEROSCOPY;  Surgeon: Cleon Gustin, MD;  Location: [facility];  Service: Urology;;   Family History  Problem Relation Age of Onset  . Diabetes Mother   . Heart disease Mother   . Stroke Mother   . CAD Mother   . Diabetes Father   . Congestive Heart Failure Father   . Cancer Brother   . Heart disease Son   . COPD Son   . Emphysema Son   . Heart attack Brother    Social History   Socioeconomic History  . Marital status: Widowed    Spouse name: Not on file  . Number of children: 3  . Years of education: Not on file  . Highest education level: 8th grade  Occupational History  . 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 Financial resource strain: Not hard at all  . Food insecurity:    Worry: Never true    Inability: Never true  . Transportation needs:    Medical: No    Non-medical: No  Tobacco Use  . Smoking status: Current Every Day Smoker    Packs/day: 1.50    Years: 60.00    Pack years: 90.00    Types: Cigarettes, Cigars  . Smokeless tobacco: Former Systems developer    Types: [place] date: 01/03/1993  Substance and Sexual Activity  . Alcohol use: No  . Drug use: No  . Sexual activity: Never  Lifestyle  . Physical activity:    Days per week: 3 days    Minutes per session: 30 min  . Stress:  Not at all  Relationships  . Social connections:    Talks on phone: More than three times a week    Gets together: Three times a week    Attends religious service: Never    Active member of club or organization: No    Attends meetings of clubs or organizations: Never    Relationship status: Widowed  Other Topics Concern  . Not on file  Social History Narrative  . Not on file   Tobacco Counseling He is currently a smoker and does not wish to quit at this time  Clinical Intake:  Pre-visit preparation  completed: Yes  Pain : No/denies pain     BMI - recorded: 26.9 Nutritional Status: BMI 25 -29 Overweight Nutritional Risks: None Diabetes: Yes CBG done?: No CBG resulted in Enter/ Edit results?: No Did pt. bring in CBG monitor from home?: No   Made patient an appt for follow up with his PCP to have lab work done for diabetes follow up  How often do you need to have someone help you when you read instructions, pamphlets, or other written materials from your doctor or pharmacy?: 1 - Never What is the last grade level you completed in school?: 8th grade  Interpreter Needed?: No     Activities of Daily Living In your present state of health, do you have any difficulty performing the following activities: 04/12/2017  Hearing? N  Vision? Y  Comment Has glasses and wears all the time  Difficulty concentrating or making decisions? N  Walking or climbing stairs? Y  Comment Knee replacement bohers him  Dressing or bathing? N  Doing errands, shopping? N  Preparing Food and eating ? N  Using the Toilet? N  In the past six months, have you accidently leaked urine? N  Do you have problems with loss of bowel control? N  Managing your Medications? N  Managing your Finances? N  Housekeeping or managing your Housekeeping? N  Some recent data might be hidden     Immunizations and Health Maintenance Immunization History  Administered Date(s) Administered  . Influenza Whole  10/04/2007  . Pneumococcal Conjugate-13 05/26/2014  . Pneumococcal Polysaccharide-23 10/04/2007, 07/27/2015  . Tdap 01/03/2006   He is currently due for a tdap. Will discuss this with him at follow up appt  Health Maintenance Due  Topic Date Due  . TETANUS/TDAP  01/04/2016  . FOOT EXAM  07/26/2016  . HEMOGLOBIN A1C  09/22/2016   Appt given for regular diabetic follow up  Patient Care Team: Sharion Balloon, FNP as PCP - General (Nurse Practitioner) Alyson Ingles Candee Furbish, MD as Consulting Physician (Urology)  Indicate any recent Medical Services you may have received from other than [facility] providers in the past year (date may be approximate).    Assessment:   This is a routine wellness examination for Petr.  Hearing/Vision screen No exam data present  Dietary issues and exercise activities discussed: Current Exercise Habits: The patient does not participate in regular exercise at present  Goals    . DIET - EAT MORE FRUITS AND VEGETABLES    . Exercise 150 min/wk Moderate Activity      Depression Screen PHQ 2/9 Scores 04/12/2017 03/22/2016 03/03/2016 07/27/2015  PHQ - 2 Score 0 1 1 0    Fall Risk Fall Risk  04/12/2017 03/22/2016 03/03/2016 07/27/2015 10/21/2014  Falls in the past year? No No No No No    Is the patient's home free of loose throw rugs in walkways, pet beds, electrical cords, etc?   yes      Grab bars in the bathroom? no      Handrails on the stairs?   yes      Adequate lighting?   yes    Cognitive Function: MMSE - Mini Mental State Exam 04/12/2017 03/03/2016  Orientation to time 5 5  Orientation to Place 5 5  Registration 3 3  Attention/ Calculation 5 5  Recall 3 2  Language- name 2 objects 2 2  Language- repeat 1 1  Language- follow 3 step command 3 3  Language- read & follow direction 1 1  Write a sentence 1 1  Copy design 1 1  Total score 30 29        Screening Tests Health Maintenance  Topic Date Due  . TETANUS/TDAP  01/04/2016  . FOOT EXAM   07/26/2016  . HEMOGLOBIN A1C  09/22/2016  . INFLUENZA VACCINE  08/03/2017  . OPHTHALMOLOGY EXAM  04/06/2018  . COLONOSCOPY  03/03/2020  . Hepatitis C Screening  Completed  . PNA vac Low Risk Adult  Completed    Qualifies for Shingles Vaccine?  Will discuss with patient at follow up visit with provider  Cancer Screenings: Lung: Low Dose CT Chest recommended if Age 66-80 years, 30 pack-year currently smoking OR have quit w/in 15years. Patient does qualify.  Patient is currently a smoker and according to the chart has 90 pack years. Will send message to PCP for a referral to be made  Colorectal: Patient had a colonoscopy on 03/04/2010. He is not due until 03/03/2020  Additional Screenings: Hepatitis C Screening:   Patient had this done on 10/21/2014 with a negative result      Plan:     I have personally reviewed and noted the following in the patient's chart:   . Medical and social history . Use of alcohol, tobacco or illicit drugs  . Current medications and supplements . Functional ability and status . Nutritional status . Physical activity . Advanced directives . List of other physicians . Hospitalizations, surgeries, and ER visits in previous 12 months . Vitals . Screenings to include cognitive, depression, and falls . Referrals and appointments  In addition, I have reviewed and discussed with patient certain preventive protocols, quality metrics, and best practice recommendations. A written personalized care plan for preventive services as well as general preventive health recommendations were provided to patient.   Patient was given a follow up for this Friday with his PCP. He is overdue for diabetic follow up. She will also discuss shingles vaccine, and discuss ordering CT of chest  Rolena Infante, LPN   [id number]    I have reviewed and agree with the above AWV documentation.   Evelina Dun, FNP

## 2017-04-12 NOTE — Patient Instructions (Signed)
  Dalton Miller , Thank you for taking time to come for your Medicare Wellness Visit. I appreciate your ongoing commitment to your health goals. Please review the following plan we discussed and let me know if I can assist you in the future.   These are the goals we discussed: Goals    . DIET - EAT MORE FRUITS AND VEGETABLES    . Exercise 150 min/wk Moderate Activity       This is a list of the screening recommended for you and due dates:  Health Maintenance  Topic Date Due  . Tetanus Vaccine  01/04/2016  . Complete foot exam   07/26/2016  . Hemoglobin A1C  09/22/2016  . Flu Shot  08/03/2017  . Eye exam for diabetics  04/06/2018  . Colon Cancer Screening  03/03/2020  .  Hepatitis C: One time screening is recommended by Center for Disease Control  (CDC) for  adults born from [birth year] through 1964.   Completed  . Pneumonia vaccines  Completed

## 2017-04-14 ENCOUNTER — Encounter: Payer: Self-pay | Admitting: Family

## 2017-04-14 ENCOUNTER — Ambulatory Visit (INDEPENDENT_AMBULATORY_CARE_PROVIDER_SITE_OTHER): Payer: Medicare HMO | Admitting: Family

## 2017-04-14 VITALS — BP 131/73 | HR 57 | Temp 97.5°F | Ht 67.0 in | Wt 172.4 lb

## 2017-04-14 DIAGNOSIS — E1165 Type 2 diabetes mellitus with hyperglycemia: Secondary | ICD-10-CM | POA: Diagnosis not present

## 2017-04-14 DIAGNOSIS — E1159 Type 2 diabetes mellitus with other circulatory complications: Secondary | ICD-10-CM | POA: Diagnosis not present

## 2017-04-14 DIAGNOSIS — Z0000 Encounter for general adult medical examination without abnormal findings: Secondary | ICD-10-CM | POA: Diagnosis not present

## 2017-04-14 DIAGNOSIS — Z905 Acquired absence of kidney: Secondary | ICD-10-CM

## 2017-04-14 DIAGNOSIS — I152 Hypertension secondary to endocrine disorders: Secondary | ICD-10-CM

## 2017-04-14 DIAGNOSIS — E1169 Type 2 diabetes mellitus with other specified complication: Secondary | ICD-10-CM

## 2017-04-14 DIAGNOSIS — Z720 Tobacco use: Secondary | ICD-10-CM | POA: Diagnosis not present

## 2017-04-14 DIAGNOSIS — E663 Overweight: Secondary | ICD-10-CM

## 2017-04-14 DIAGNOSIS — I10 Essential (primary) hypertension: Secondary | ICD-10-CM | POA: Diagnosis not present

## 2017-04-14 DIAGNOSIS — E785 Hyperlipidemia, unspecified: Secondary | ICD-10-CM

## 2017-04-14 DIAGNOSIS — Z125 Encounter for screening for malignant neoplasm of prostate: Secondary | ICD-10-CM | POA: Diagnosis not present

## 2017-04-14 LAB — BAYER DCA HB A1C WAIVED: HB A1C (BAYER DCA - WAIVED): 5.4 % (ref <7.0)

## 2017-04-14 NOTE — Progress Notes (Signed)
Subjective:    Patient ID: Dalton Miller, male    DOB: 1945/12/25, 72 y.o.   MRN: [id number]  Pt presents to the office today for CPE.  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  Has followed by Urologists every 6 months.  Diabetes  He presents for his follow-up diabetic visit. He has type 2 diabetes mellitus. His disease course has been stable. There are no hypoglycemic associated symptoms. Associated symptoms include foot paresthesias. Pertinent negatives for diabetes include no blurred vision and no visual change. There are no hypoglycemic complications. Symptoms are stable. Diabetic complications include peripheral neuropathy. Pertinent negatives for diabetic complications include no CVA or heart disease. Risk factors for coronary artery disease include dyslipidemia, diabetes mellitus, male sex, obesity, sedentary lifestyle, tobacco exposure and family history. He is following a generally healthy diet. His overall blood glucose range is 110-130 mg/dl. Eye exam is current.  Hypertension  This is a chronic problem. The current episode started more than 1 year ago. The problem has been resolved since onset. The problem is controlled. Pertinent negatives include no blurred vision, malaise/fatigue, peripheral edema or shortness of breath. Risk factors for coronary artery disease include diabetes mellitus, dyslipidemia, family history and smoking/tobacco exposure. The current treatment provides moderate improvement. There is no history of CAD/MI or CVA.  Hyperlipidemia  This is a chronic problem. The current episode started more than 1 year ago. The problem is controlled. Pertinent negatives include no shortness of breath. Current antihyperlipidemic treatment includes statins. The current treatment provides moderate improvement of lipids. Risk factors for coronary artery disease include dyslipidemia, diabetes mellitus, family history, male sex and hypertension.      Review  of Systems  Constitutional: Negative for malaise/fatigue.  Eyes: Negative for blurred vision.  Respiratory: Negative for shortness of breath.   All other systems reviewed and are negative.      Objective:   Physical Exam  Constitutional: He is oriented to person, place, and time. He appears well-developed and well-nourished. No distress.  HENT:  Head: Normocephalic.  Right Ear: External ear normal.  Left Ear: External ear normal.  Nose: Nose normal.  Mouth/Throat: Oropharynx is clear and moist.  Eyes: Pupils are equal, round, and reactive to light. Right eye exhibits no discharge. Left eye exhibits no discharge.  Neck: Normal range of motion. Neck supple. No thyromegaly present.  Cardiovascular: Normal rate, regular rhythm, normal heart sounds and intact distal pulses.  No murmur heard. Pulmonary/Chest: Effort normal and breath sounds normal. No respiratory distress. He has no wheezes.  Abdominal: Soft. Bowel sounds are normal. He exhibits no distension. There is no tenderness.  Musculoskeletal: Normal range of motion. He exhibits no edema or tenderness.  Neurological: He is alert and oriented to person, place, and time.  Skin: Skin is warm and dry. No rash noted. No erythema.  Psychiatric: He has a normal mood and affect. His behavior is normal. Judgment and thought content normal.  Vitals reviewed.  Diabetic Foot Exam - Simple   Simple Foot Form Diabetic Foot exam was performed with the following findings:  Yes 04/14/2017  8:48 AM  Visual Inspection No deformities, no ulcerations, no other skin breakdown bilaterally:  Yes Sensation Testing Intact to touch and monofilament testing bilaterally:  Yes Pulse Check Posterior Tibialis and Dorsalis pulse intact bilaterally:  Yes Comments       BP 131/73   Pulse (!) 57   Temp (!) 97.5 F (36.4 C) (Oral)   Ht _0  (  1.702 m)   Wt 172 lb 6.4 oz (78.2 kg)   BMI 27.00 kg/m      Assessment & Plan:  1. Annual physical exam -  Bayer DCA Hb A1c Waived - CMP14+EGFR - Lipid panel - CBC with Differential/Platelet - PR PSA, TOTAL SCREENING - Microalbumin / creatinine urine ratio - TSH - VITAMIN D 25 Hydroxy (Vit-D Deficiency, Fractures)  2. Hypertension associated with diabetes ([place]) - CMP14+EGFR - CBC with Differential/Platelet  3. Type 2 diabetes mellitus with hyperglycemia, without long-term current use of insulin ([facility]) - Bayer DCA Hb A1c Waived - CMP14+EGFR - CBC with Differential/Platelet - Microalbumin / creatinine urine ratio  4. Tobacco user - CMP14+EGFR - CBC with Differential/Platelet  5. Overweight (BMI 25.0-29.9)  - CMP14+EGFR - CBC with Differential/Platelet  6. Hyperlipidemia associated with type 2 diabetes mellitus ([facility])  - CMP14+EGFR - Lipid panel - CBC with Differential/Platelet  7. Single kidney - CMP14+EGFR - CBC with Differential/Platelet   Continue all meds Labs pending Health Maintenance reviewed Diet and exercise encouraged RTO 4  Months   Evelina Dun, FNP

## 2017-04-14 NOTE — Progress Notes (Signed)
, °

## 2017-04-14 NOTE — Patient Instructions (Signed)

## 2017-04-15 LAB — CMP14+EGFR
ALK PHOS: 77 IU/L (ref 39–117)
ALT: 8 IU/L (ref 0–44)
AST: 14 IU/L (ref 0–40)
Albumin/Globulin Ratio: 1.8 (ref 1.2–2.2)
Albumin: 4.4 g/dL (ref 3.5–4.8)
BUN/Creatinine Ratio: 12 (ref 10–24)
BUN: 14 mg/dL (ref 8–27)
Bilirubin Total: 0.3 mg/dL (ref 0.0–1.2)
CO2: 22 mmol/L (ref 20–29)
CREATININE: 1.16 mg/dL (ref 0.76–1.27)
Calcium: 9.3 mg/dL (ref 8.6–10.2)
Chloride: 106 mmol/L (ref 96–106)
GFR calc Af Amer: 73 mL/min/{1.73_m2} (ref >59)
GFR calc non Af Amer: 63 mL/min/{1.73_m2} (ref >59)
GLUCOSE: 50 mg/dL — AB (ref 65–99)
Globulin, Total: 2.4 g/dL (ref 1.5–4.5)
Potassium: 5.3 mmol/L — ABNORMAL HIGH (ref 3.5–5.2)
Sodium: 141 mmol/L (ref 134–144)
Total Protein: 6.8 g/dL (ref 6.0–8.5)

## 2017-04-15 LAB — CBC WITH DIFFERENTIAL/PLATELET
BASOS: 1 %
Basophils Absolute: 0.1 10*3/uL (ref 0.0–0.2)
EOS (ABSOLUTE): 0.2 10*3/uL (ref 0.0–0.4)
EOS: 3 %
HEMOGLOBIN: 15.8 g/dL (ref 13.0–17.7)
Hematocrit: 45.3 % (ref 37.5–51.0)
IMMATURE GRANS (ABS): 0 10*3/uL (ref 0.0–0.1)
IMMATURE GRANULOCYTES: 0 %
Lymphocytes Absolute: 2.9 10*3/uL (ref 0.7–3.1)
Lymphs: 40 %
MCH: 32.4 pg (ref 26.6–33.0)
MCHC: 34.9 g/dL (ref 31.5–35.7)
MCV: 93 fL (ref 79–97)
MONOCYTES: 6 %
Monocytes Absolute: 0.4 10*3/uL (ref 0.1–0.9)
NEUTROS PCT: 50 %
Neutrophils Absolute: 3.7 10*3/uL (ref 1.4–7.0)
PLATELETS: 166 10*3/uL (ref 150–379)
RBC: 4.88 x10E6/uL (ref 4.14–5.80)
RDW: 14.5 % (ref 12.3–15.4)
WBC: 7.3 10*3/uL (ref 3.4–10.8)

## 2017-04-15 LAB — LIPID PANEL
CHOLESTEROL TOTAL: 137 mg/dL (ref 100–199)
Chol/HDL Ratio: 2.7 ratio (ref 0.0–5.0)
HDL: 51 mg/dL (ref >39)
LDL CALC: 74 mg/dL (ref 0–99)
Triglycerides: 58 mg/dL (ref 0–149)
VLDL CHOLESTEROL CAL: 12 mg/dL (ref 5–40)

## 2017-04-15 LAB — TSH: TSH: 0.995 u[IU]/mL (ref 0.450–4.500)

## 2017-04-15 LAB — VITAMIN D 25 HYDROXY (VIT D DEFICIENCY, FRACTURES): Vit D, 25-Hydroxy: 21.9 ng/mL — ABNORMAL LOW (ref 30.0–100.0)

## 2017-04-18 ENCOUNTER — Other Ambulatory Visit: Payer: Self-pay | Admitting: Family

## 2017-04-18 LAB — PSA, TOTAL AND FREE
PROSTATE SPECIFIC AG, SERUM: 0.8 ng/mL (ref 0.0–4.0)
PSA FREE: 0.27 ng/mL
PSA, Free Pct: 33.8 %

## 2017-04-18 LAB — SPECIMEN STATUS REPORT

## 2017-04-18 MED ORDER — VITAMIN D (ERGOCALCIFEROL) 1.25 MG (50000 UNIT) PO CAPS: 50000.0000 [IU] | ORAL_CAPSULE | ORAL | 3 refills | Status: DC

## 2017-05-31 ENCOUNTER — Ambulatory Visit: Payer: Medicare HMO | Admitting: Urology

## 2017-06-03 IMAGING — RF DG CYSTOGRAM 3+V
12 series · 12 of 12 positions shown · non-contrast
Comparison: None

CLINICAL DATA: LEFT ureteral cancer post LEFT nephroureterectomy

EXAM:
CYSTOGRAM
TECHNIQUE: After catheterization of the urinary bladder following sterile
technique the bladder was filled with 250 mL [id number] by drip
infusion. Serial spot images were obtained during bladder filling
and post draining.
FLUOROSCOPY TIME:  Fluoroscopy Time:  1 minute 0 seconds
Radiation Exposure Index (if provided by the fluoroscopic device):
13.7 mGy
Number of Acquired Spot Images: 0

[Series 1: cp_standard · 0.19mm/px · 1 of 1 slices shown (1 of 12)]
[im 1/1]
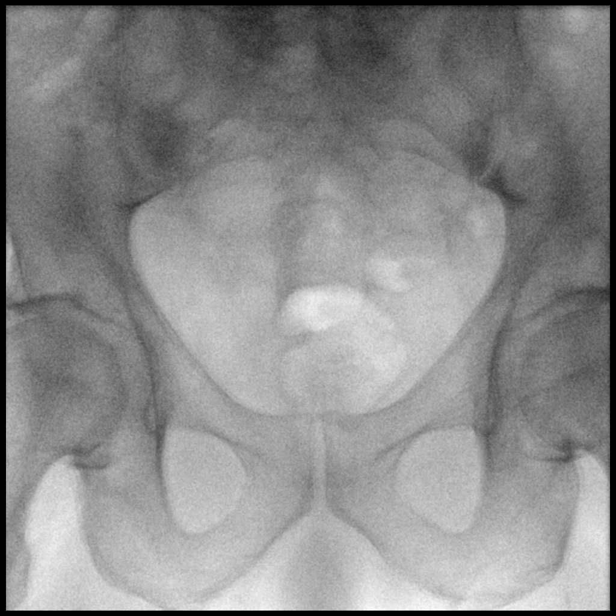

[Series 2: cp_standard · 0.19mm/px · 1 of 1 slices shown (2 of 12)]
[im 1/1]
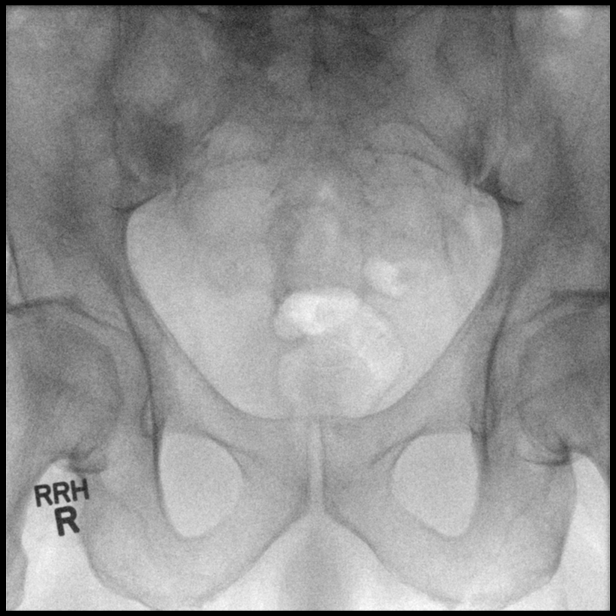

[Series 3: cp_standard · 0.19mm/px · 1 of 1 slices shown (3 of 12)]
[im 1/1]
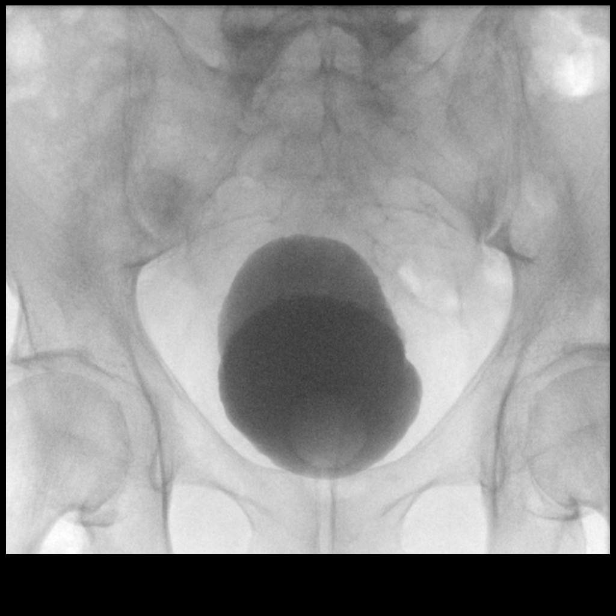

[Series 4: cp_standard · 0.19mm/px · 1 of 1 slices shown (4 of 12)]
[im 1/1]
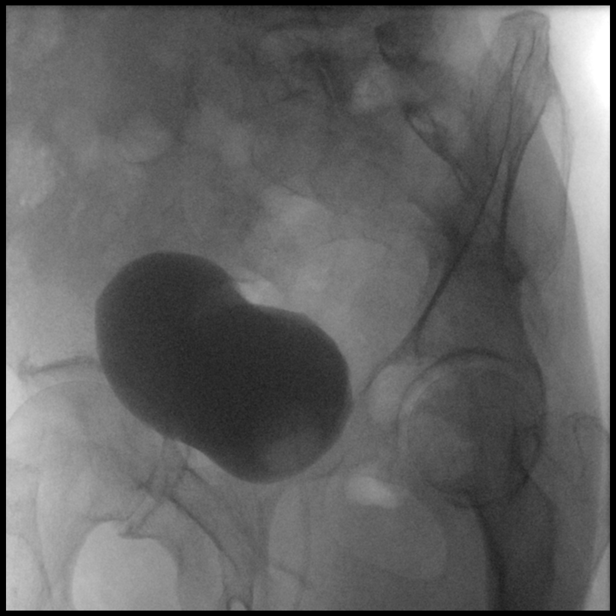

[Series 5: cp_standard · 0.18mm/px · 1 of 1 slices shown (5 of 12)]
[im 1/1]
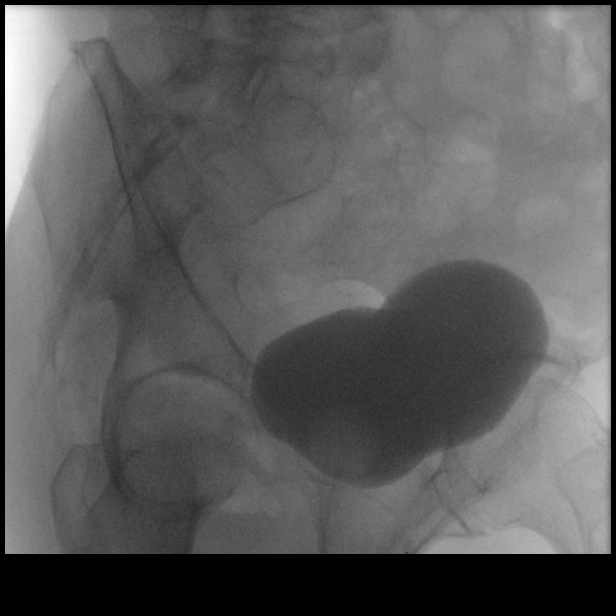

[Series 6: cp_standard · 0.18mm/px · 1 of 1 slices shown (6 of 12)]
[im 1/1]
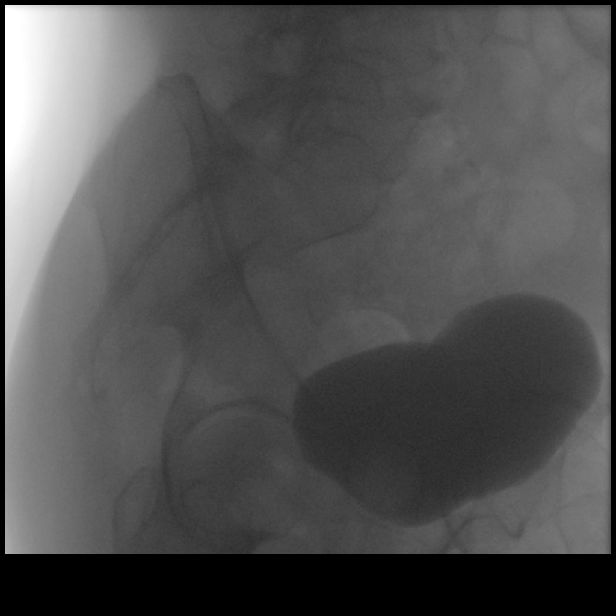

[Series 7: cp_standard · 0.18mm/px · 1 of 1 slices shown (7 of 12)]
[im 1/1]
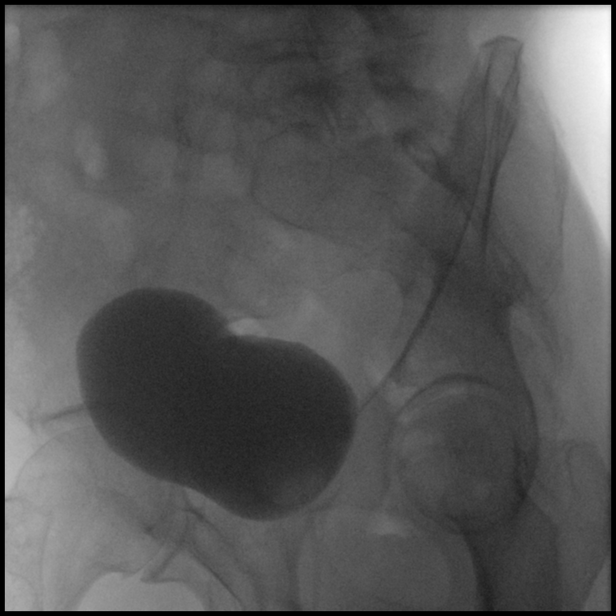

[Series 8: cp_standard · 0.18mm/px · 1 of 1 slices shown (8 of 12)]
[im 1/1]
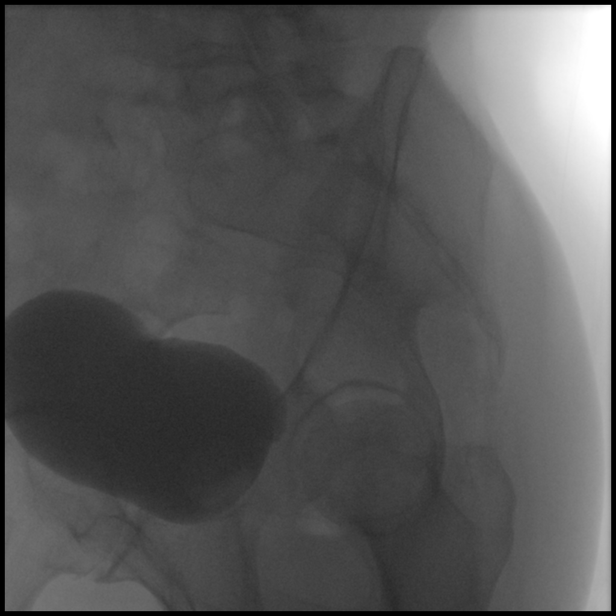

[Series 9: cp_standard · 0.18mm/px · 1 of 1 slices shown (9 of 12)]
[im 1/1]
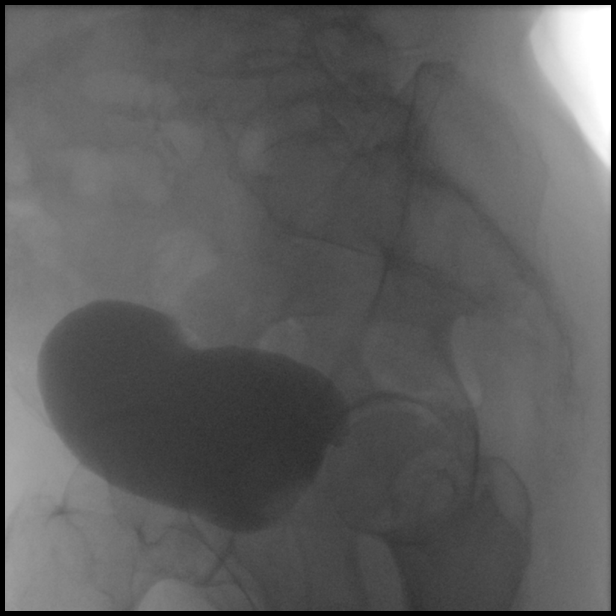

[Series 10: cp_standard · 0.18mm/px · 1 of 1 slices shown (10 of 12)]
[im 1/1]
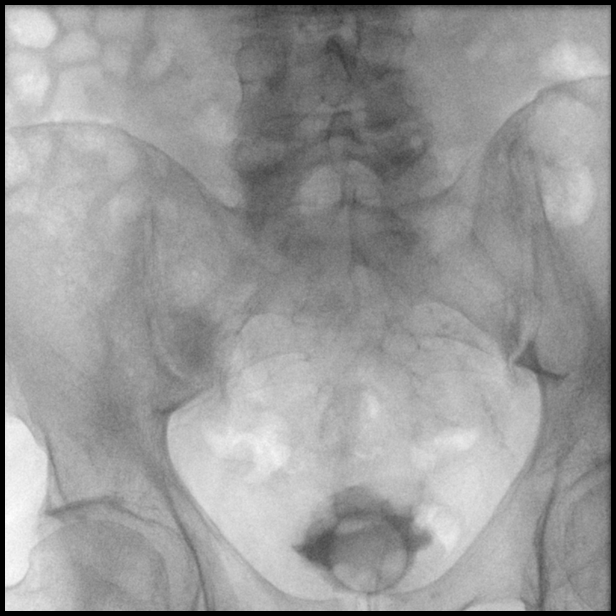

[Series 11: cp_standard · 0.18mm/px · 1 of 1 slices shown (11 of 12)]
[im 1/1]
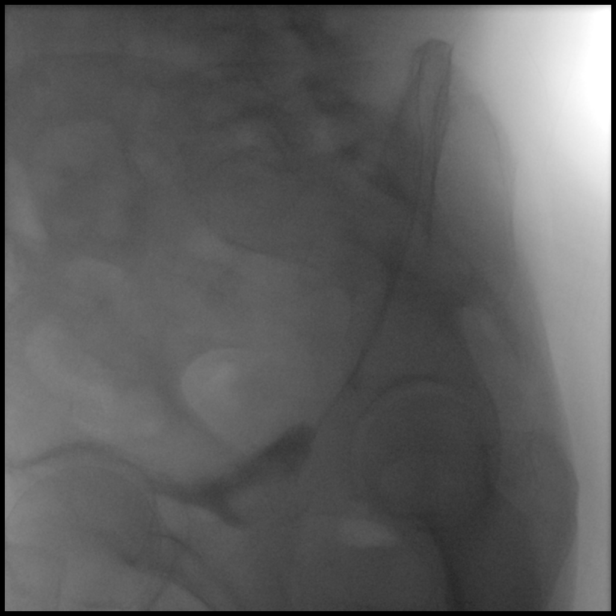

[Series 12: cp_standard · 0.18mm/px · 1 of 1 slices shown (12 of 12)]
[im 1/1]
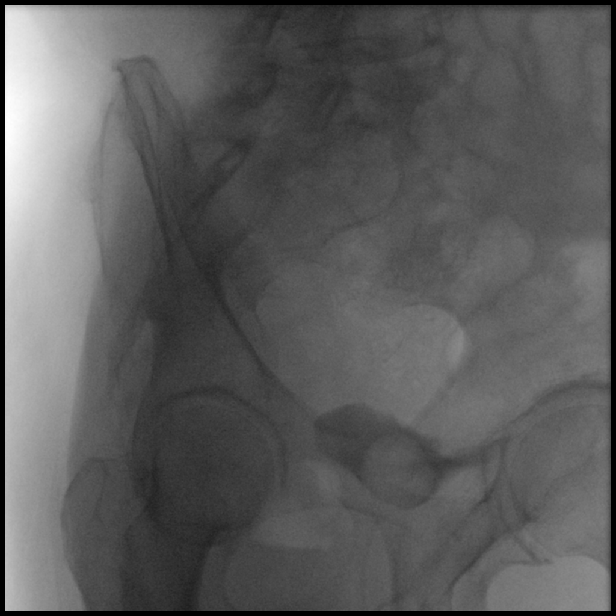

[12 of 12 positions shown; findings below may reference images not displayed]

FINDINGS: Normal appearance of pelvis on scout image.

With installation of contrast retrograde by gravity drip, bladder
distended normally with single filling defect from the Foley
catheter balloon.

No contrast extravasation from urinary bladder identified.

No RIGHT vesicoureteral reflux or additional bladder filling defects
identified.

Procedure tolerated very well by patient.
IMPRESSION: No evidence of contrast extravasation from the urinary bladder
following retrograde installation of 250 mL of contrast.

## 2017-06-24 ENCOUNTER — Other Ambulatory Visit: Payer: Self-pay | Admitting: Family

## 2017-06-24 DIAGNOSIS — E1165 Type 2 diabetes mellitus with hyperglycemia: Secondary | ICD-10-CM

## 2017-08-14 ENCOUNTER — Ambulatory Visit (INDEPENDENT_AMBULATORY_CARE_PROVIDER_SITE_OTHER): Payer: Medicare HMO | Admitting: Family

## 2017-08-14 ENCOUNTER — Encounter: Payer: Self-pay | Admitting: Family

## 2017-08-14 VITALS — BP 127/72 | HR 55 | Temp 97.7°F | Ht 67.0 in | Wt 172.4 lb

## 2017-08-14 DIAGNOSIS — E785 Hyperlipidemia, unspecified: Secondary | ICD-10-CM

## 2017-08-14 DIAGNOSIS — E1169 Type 2 diabetes mellitus with other specified complication: Secondary | ICD-10-CM

## 2017-08-14 DIAGNOSIS — Z720 Tobacco use: Secondary | ICD-10-CM

## 2017-08-14 DIAGNOSIS — E1159 Type 2 diabetes mellitus with other circulatory complications: Secondary | ICD-10-CM | POA: Diagnosis not present

## 2017-08-14 DIAGNOSIS — E1165 Type 2 diabetes mellitus with hyperglycemia: Secondary | ICD-10-CM | POA: Diagnosis not present

## 2017-08-14 DIAGNOSIS — I10 Essential (primary) hypertension: Secondary | ICD-10-CM

## 2017-08-14 DIAGNOSIS — I152 Hypertension secondary to endocrine disorders: Secondary | ICD-10-CM

## 2017-08-14 DIAGNOSIS — E663 Overweight: Secondary | ICD-10-CM | POA: Diagnosis not present

## 2017-08-14 DIAGNOSIS — E559 Vitamin D deficiency, unspecified: Secondary | ICD-10-CM | POA: Diagnosis not present

## 2017-08-14 NOTE — Patient Instructions (Signed)
Diabetes Mellitus and Nutrition When you have diabetes (diabetes mellitus), it is very important to have healthy eating habits because your blood sugar (glucose) levels are greatly affected by what you eat and drink. Eating healthy foods in the appropriate amounts, at about the same times every day, can help you:  Control your blood glucose.  Lower your risk of heart disease.  Improve your blood pressure.  Reach or maintain a healthy weight.  Every person with diabetes is different, and each person has different needs for a meal plan. Your health care provider may recommend that you work with a diet and nutrition specialist (dietitian) to make a meal plan that is best for you. Your meal plan may vary depending on factors such as:  The calories you need.  The medicines you take.  Your weight.  Your blood glucose, blood pressure, and cholesterol levels.  Your activity level.  Other health conditions you have, such as heart or kidney disease.  How do carbohydrates affect me? Carbohydrates affect your blood glucose level more than any other type of food. Eating carbohydrates naturally increases the amount of glucose in your blood. Carbohydrate counting is a method for keeping track of how many carbohydrates you eat. Counting carbohydrates is important to keep your blood glucose at a healthy level, especially if you use insulin or take certain oral diabetes medicines. It is important to know how many carbohydrates you can safely have in each meal. This is different for every person. Your dietitian can help you calculate how many carbohydrates you should have at each meal and for snack. Foods that contain carbohydrates include:  Bread, cereal, rice, pasta, and crackers.  Potatoes and corn.  Peas, beans, and lentils.  Milk and yogurt.  Fruit and juice.  Desserts, such as cakes, cookies, ice cream, and candy.  How does alcohol affect me? Alcohol can cause a sudden decrease in blood  glucose (hypoglycemia), especially if you use insulin or take certain oral diabetes medicines. Hypoglycemia can be a life-threatening condition. Symptoms of hypoglycemia (sleepiness, dizziness, and confusion) are similar to symptoms of having too much alcohol. If your health care provider says that alcohol is safe for you, follow these guidelines:  Limit alcohol intake to no more than 1 drink per day for nonpregnant women and 2 drinks per day for men. One drink equals 12 oz of beer, 5 oz of wine, or 1 oz of hard liquor.  Do not drink on an empty stomach.  Keep yourself hydrated with water, diet soda, or unsweetened iced tea.  Keep in mind that regular soda, juice, and other mixers may contain a lot of sugar and must be counted as carbohydrates.  What are tips for following this plan? Reading food labels  Start by checking the serving size on the label. The amount of calories, carbohydrates, fats, and other nutrients listed on the label are based on one serving of the food. Many foods contain more than one serving per package.  Check the total grams (g) of carbohydrates in one serving. You can calculate the number of servings of carbohydrates in one serving by dividing the total carbohydrates by 15. For example, if a food has 30 g of total carbohydrates, it would be equal to 2 servings of carbohydrates.  Check the number of grams (g) of saturated and trans fats in one serving. Choose foods that have low or no amount of these fats.  Check the number of milligrams (mg) of sodium in one serving. Most people   should limit total sodium intake to less than 2,300 mg per day.  Always check the nutrition information of foods labeled as "low-fat" or "nonfat". These foods may be higher in added sugar or refined carbohydrates and should be avoided.  Talk to your dietitian to identify your daily goals for nutrients listed on the label. Shopping  Avoid buying canned, premade, or processed foods. These  foods tend to be high in fat, sodium, and added sugar.  Shop around the outside edge of the grocery store. This includes fresh fruits and vegetables, bulk grains, fresh meats, and fresh dairy. Cooking  Use low-heat cooking methods, such as baking, instead of high-heat cooking methods like deep frying.  Cook using healthy oils, such as olive, canola, or sunflower oil.  Avoid cooking with butter, cream, or high-fat meats. Meal planning  Eat meals and snacks regularly, preferably at the same times every day. Avoid going long periods of time without eating.  Eat foods high in fiber, such as fresh fruits, vegetables, beans, and whole grains. Talk to your dietitian about how many servings of carbohydrates you can eat at each meal.  Eat 4-6 ounces of lean protein each day, such as lean meat, chicken, fish, eggs, or tofu. 1 ounce is equal to 1 ounce of meat, chicken, or fish, 1 egg, or 1/4 cup of tofu.  Eat some foods each day that contain healthy fats, such as avocado, nuts, seeds, and fish. Lifestyle   Check your blood glucose regularly.  Exercise at least 30 minutes 5 or more days each week, or as told by your health care provider.  Take medicines as told by your health care provider.  Do not use any products that contain nicotine or tobacco, such as cigarettes and e-cigarettes. If you need help quitting, ask your health care provider.  Work with a counselor or diabetes educator to identify strategies to manage stress and any emotional and social challenges. What are some questions to ask my health care provider?  Do I need to meet with a diabetes educator?  Do I need to meet with a dietitian?  What number can I call if I have questions?  When are the best times to check my blood glucose? Where to find more information:  American Diabetes Association: diabetes.org/food-and-fitness/food  Academy of Nutrition and Dietetics:  www.eatright.org/resources/health/diseases-and-conditions/diabetes  National Institute of Diabetes and Digestive and Kidney Diseases (NIH): www.niddk.nih.gov/health-information/diabetes/overview/diet-eating-physical-activity Summary  A healthy meal plan will help you control your blood glucose and maintain a healthy lifestyle.  Working with a diet and nutrition specialist (dietitian) can help you make a meal plan that is best for you.  Keep in mind that carbohydrates and alcohol have immediate effects on your blood glucose levels. It is important to count carbohydrates and to use alcohol carefully. This information is not intended to replace advice given to you by your health care provider. Make sure you discuss any questions you have with your health care provider. Document Released: 09/16/2004 Document Revised: 01/25/2016 Document Reviewed: 01/25/2016 Elsevier Interactive Patient Education  2018 Elsevier Inc.  

## 2017-08-14 NOTE — Progress Notes (Signed)
Subjective:    Patient ID: Dalton Miller, male    DOB: February 12, 1945, 72 y.o.   MRN: [id number]  Chief Complaint  Patient presents with  . 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 Has followed by Urologists every 6 months. Diabetes  He presents for his follow-up diabetic visit. He has type 2 diabetes mellitus. His disease course has been stable. Hypoglycemia symptoms include dizziness and nervousness/anxiousness. Pertinent negatives for diabetes include no blurred vision and no foot paresthesias. There are no hypoglycemic complications. Symptoms are stable. Pertinent negatives for diabetic complications include no CVA, heart disease, nephropathy or peripheral neuropathy. Risk factors for coronary artery disease include dyslipidemia, male sex, hypertension, obesity, stress and tobacco exposure. He is following a generally healthy diet. His overall blood glucose range is 110-130 mg/dl. Eye exam is current.  Hyperlipidemia  This is a chronic problem. The current episode started more than 1 year ago. The problem is controlled. Recent lipid tests were reviewed and are normal. Pertinent negatives include no shortness of breath. Current antihyperlipidemic treatment includes statins. The current treatment provides moderate improvement of lipids. Risk factors for coronary artery disease include dyslipidemia, diabetes mellitus, male sex, hypertension and a sedentary lifestyle.  Hypertension  This is a chronic problem. The current episode started more than 1 year ago. The problem has been waxing and waning since onset. The problem is controlled. Pertinent negatives include no blurred vision, malaise/fatigue, peripheral edema or shortness of breath. Risk factors for coronary artery disease include dyslipidemia, diabetes mellitus, male gender, sedentary lifestyle and smoking/tobacco exposure. The  current treatment provides moderate improvement. Hypertensive end-organ damage includes kidney disease. There is no history of CAD/MI, CVA or heart failure.      Review of Systems  Constitutional: Negative for malaise/fatigue.  Eyes: Negative for blurred vision.  Respiratory: Negative for shortness of breath.   Neurological: Positive for dizziness.  Psychiatric/Behavioral: The patient is nervous/anxious.   All other systems reviewed and are negative.      Objective:   Physical Exam  Constitutional: He is oriented to person, place, and time. He appears well-developed and well-nourished. No distress.  HENT:  Head: Normocephalic.  Right Ear: External ear normal.  Left Ear: External ear normal.  Mouth/Throat: Oropharynx is clear and moist.  Eyes: Pupils are equal, round, and reactive to light. Right eye exhibits no discharge. Left eye exhibits no discharge.  Neck: Normal range of motion. Neck supple. No thyromegaly present.  Cardiovascular: Normal rate, regular rhythm, normal heart sounds and intact distal pulses.  No murmur heard. Pulmonary/Chest: Effort normal and breath sounds normal. No respiratory distress. He has no wheezes.  Abdominal: Soft. Bowel sounds are normal. He exhibits no distension. There is no tenderness.  Musculoskeletal: Normal range of motion. He exhibits no edema or tenderness.  Neurological: He is alert and oriented to person, place, and time. He has normal reflexes. No cranial nerve deficit.  Skin: Skin is warm and dry. No rash noted. No erythema.  Psychiatric: He has a normal mood and affect. His behavior is normal. Judgment and thought content normal.  Vitals reviewed.     BP 127/72   Pulse (!) 55   Temp 97.7 F (36.5 C) (Oral)   Ht '5\' 7"'  (1.702 m)   Wt 172 lb 6.4 oz (78.2 kg)   BMI 27.00 kg/m      Assessment & Plan:  Dalton Miller comes in today with chief complaint of Diabetes (four month recheck)   Diagnosis and orders addressed:  1.  Hypertension associated with diabetes ([place]) - CMP14+EGFR  2. Type 2 diabetes mellitus with hyperglycemia, without long-term current use of insulin (HCC) We will stop Glipizide and metformin Low carb diet Follow up in 3 months  - Bayer DCA Hb A1c Waived - CMP14+EGFR - Microalbumin / creatinine urine ratio  3. Hyperlipidemia associated with type 2 diabetes mellitus (HCC) - CMP14+EGFR - Lipid panel  4. Tobacco user - CMP14+EGFR  5. Overweight (BMI 25.0-29.9) - CMP14+EGFR  6. Vitamin D deficiency - CMP14+EGFR - VITAMIN D 25 Hydroxy (Vit-D Deficiency, Fractures)   Labs pending Health Maintenance reviewed Diet and exercise encouraged  Follow up plan: 3 months    Evelina Dun, FNP

## 2017-08-15 LAB — LIPID PANEL
CHOLESTEROL TOTAL: 114 mg/dL (ref 100–199)
Chol/HDL Ratio: 2.3 ratio (ref 0.0–5.0)
HDL: 49 mg/dL (ref >39)
LDL Calculated: 50 mg/dL (ref 0–99)
TRIGLYCERIDES: 76 mg/dL (ref 0–149)
VLDL Cholesterol Cal: 15 mg/dL (ref 5–40)

## 2017-08-15 LAB — CMP14+EGFR
A/G RATIO: 1.8 (ref 1.2–2.2)
ALT: 9 IU/L (ref 0–44)
AST: 14 IU/L (ref 0–40)
Albumin: 4.5 g/dL (ref 3.5–4.8)
Alkaline Phosphatase: 71 IU/L (ref 39–117)
BUN/Creatinine Ratio: 10 (ref 10–24)
BUN: 14 mg/dL (ref 8–27)
Bilirubin Total: 0.5 mg/dL (ref 0.0–1.2)
CALCIUM: 9.6 mg/dL (ref 8.6–10.2)
CO2: 22 mmol/L (ref 20–29)
Chloride: 106 mmol/L (ref 96–106)
Creatinine, Ser: 1.36 mg/dL — ABNORMAL HIGH (ref 0.76–1.27)
GFR calc Af Amer: 60 mL/min/{1.73_m2} (ref >59)
GFR, EST NON AFRICAN AMERICAN: 52 mL/min/{1.73_m2} — AB (ref >59)
GLUCOSE: 49 mg/dL — AB (ref 65–99)
Globulin, Total: 2.5 g/dL (ref 1.5–4.5)
POTASSIUM: 5.2 mmol/L (ref 3.5–5.2)
Sodium: 142 mmol/L (ref 134–144)
TOTAL PROTEIN: 7 g/dL (ref 6.0–8.5)

## 2017-08-15 LAB — MICROALBUMIN / CREATININE URINE RATIO
Creatinine, Urine: 57 mg/dL
MICROALB/CREAT RATIO: 30.2 mg/g{creat} — AB (ref 0.0–30.0)
Microalbumin, Urine: 17.2 ug/mL

## 2017-08-15 LAB — VITAMIN D 25 HYDROXY (VIT D DEFICIENCY, FRACTURES): Vit D, 25-Hydroxy: 24.2 ng/mL — ABNORMAL LOW (ref 30.0–100.0)

## 2017-08-15 LAB — BAYER DCA HB A1C WAIVED: HB A1C: 5.4 % (ref <7.0)

## 2017-08-17 ENCOUNTER — Other Ambulatory Visit: Payer: Self-pay | Admitting: Family

## 2017-08-17 MED ORDER — VITAMIN D (ERGOCALCIFEROL) 1.25 MG (50000 UNIT) PO CAPS: 50000.0000 [IU] | ORAL_CAPSULE | ORAL | 3 refills | Status: DC

## 2017-09-19 ENCOUNTER — Other Ambulatory Visit: Payer: Self-pay | Admitting: Family

## 2017-09-19 DIAGNOSIS — I10 Essential (primary) hypertension: Secondary | ICD-10-CM

## 2017-09-19 DIAGNOSIS — E782 Mixed hyperlipidemia: Secondary | ICD-10-CM

## 2017-11-16 ENCOUNTER — Encounter: Payer: Self-pay | Admitting: Family

## 2017-11-16 ENCOUNTER — Ambulatory Visit (INDEPENDENT_AMBULATORY_CARE_PROVIDER_SITE_OTHER): Payer: Medicare HMO | Admitting: Family

## 2017-11-16 VITALS — BP 148/76 | HR 55 | Temp 97.0°F | Ht 67.0 in | Wt 169.2 lb

## 2017-11-16 DIAGNOSIS — E1165 Type 2 diabetes mellitus with hyperglycemia: Secondary | ICD-10-CM

## 2017-11-16 DIAGNOSIS — E663 Overweight: Secondary | ICD-10-CM

## 2017-11-16 DIAGNOSIS — I10 Essential (primary) hypertension: Secondary | ICD-10-CM | POA: Diagnosis not present

## 2017-11-16 DIAGNOSIS — E1159 Type 2 diabetes mellitus with other circulatory complications: Secondary | ICD-10-CM

## 2017-11-16 DIAGNOSIS — E1169 Type 2 diabetes mellitus with other specified complication: Secondary | ICD-10-CM

## 2017-11-16 DIAGNOSIS — Z720 Tobacco use: Secondary | ICD-10-CM

## 2017-11-16 DIAGNOSIS — I152 Hypertension secondary to endocrine disorders: Secondary | ICD-10-CM

## 2017-11-16 DIAGNOSIS — E785 Hyperlipidemia, unspecified: Secondary | ICD-10-CM

## 2017-11-16 LAB — CMP14+EGFR
ALK PHOS: 70 IU/L (ref 39–117)
ALT: 6 IU/L (ref 0–44)
AST: 13 IU/L (ref 0–40)
Albumin/Globulin Ratio: 2 (ref 1.2–2.2)
Albumin: 4.3 g/dL (ref 3.5–4.8)
BILIRUBIN TOTAL: 0.5 mg/dL (ref 0.0–1.2)
BUN/Creatinine Ratio: 12 (ref 10–24)
BUN: 16 mg/dL (ref 8–27)
CHLORIDE: 105 mmol/L (ref 96–106)
CO2: 22 mmol/L (ref 20–29)
Calcium: 9.4 mg/dL (ref 8.6–10.2)
Creatinine, Ser: 1.37 mg/dL — ABNORMAL HIGH (ref 0.76–1.27)
GFR calc Af Amer: 60 mL/min/{1.73_m2} (ref >59)
GFR calc non Af Amer: 52 mL/min/{1.73_m2} — ABNORMAL LOW (ref >59)
Globulin, Total: 2.2 g/dL (ref 1.5–4.5)
Glucose: 120 mg/dL — ABNORMAL HIGH (ref 65–99)
POTASSIUM: 5.2 mmol/L (ref 3.5–5.2)
Sodium: 141 mmol/L (ref 134–144)
Total Protein: 6.5 g/dL (ref 6.0–8.5)

## 2017-11-16 LAB — CBC WITH DIFFERENTIAL/PLATELET
Basophils Absolute: 0.1 10*3/uL (ref 0.0–0.2)
Basos: 1 %
EOS (ABSOLUTE): 0.1 10*3/uL (ref 0.0–0.4)
Eos: 2 %
HEMATOCRIT: 47.9 % (ref 37.5–51.0)
HEMOGLOBIN: 16.1 g/dL (ref 13.0–17.7)
IMMATURE GRANS (ABS): 0 10*3/uL (ref 0.0–0.1)
Immature Granulocytes: 0 %
LYMPHS: 30 %
Lymphocytes Absolute: 2.2 10*3/uL (ref 0.7–3.1)
MCH: 31.9 pg (ref 26.6–33.0)
MCHC: 33.6 g/dL (ref 31.5–35.7)
MCV: 95 fL (ref 79–97)
MONOCYTES: 7 %
Monocytes Absolute: 0.5 10*3/uL (ref 0.1–0.9)
Neutrophils Absolute: 4.5 10*3/uL (ref 1.4–7.0)
Neutrophils: 60 %
Platelets: 163 10*3/uL (ref 150–450)
RBC: 5.05 x10E6/uL (ref 4.14–5.80)
RDW: 12.6 % (ref 12.3–15.4)
WBC: 7.4 10*3/uL (ref 3.4–10.8)

## 2017-11-16 LAB — BAYER DCA HB A1C WAIVED: HB A1C (BAYER DCA - WAIVED): 6.2 % (ref <7.0)

## 2017-11-16 NOTE — Patient Instructions (Signed)

## 2017-11-16 NOTE — Progress Notes (Signed)
Subjective:    Patient ID: Dalton Miller, male    DOB: 08/17/1945, 72 y.o.   MRN: [id number]  Chief Complaint  Patient presents with  . 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 Has followed byUrologists every 6 months. Hypertension  This is a chronic problem. The current episode started more than 1 year ago. The problem has been waxing and waning since onset. The problem is uncontrolled. Pertinent negatives include no blurred vision, malaise/fatigue, peripheral edema or shortness of breath. Risk factors for coronary artery disease include dyslipidemia, diabetes mellitus and male gender. The current treatment provides moderate improvement. There is no history of CAD/MI, CVA or heart failure.  Hyperlipidemia  This is a chronic problem. The current episode started more than 1 year ago. The problem is controlled. Recent lipid tests were reviewed and are normal. Pertinent negatives include no shortness of breath. Current antihyperlipidemic treatment includes statins. The current treatment provides moderate improvement of lipids. Risk factors for coronary artery disease include dyslipidemia, diabetes mellitus, hypertension and male sex.  Diabetes  He presents for his follow-up diabetic visit. He has type 2 diabetes mellitus. His disease course has been stable. There are no hypoglycemic associated symptoms. Pertinent negatives for diabetes include no blurred vision, no foot paresthesias and no visual change. There are no hypoglycemic complications. Symptoms are stable. There are no diabetic complications. Pertinent negatives for diabetic complications include no CVA, nephropathy or peripheral neuropathy. Risk factors for coronary artery disease include dyslipidemia, diabetes mellitus, hypertension, male sex and sedentary lifestyle. He is following a generally healthy diet. His bedtime  blood glucose range is generally 130-140 mg/dl. His overall blood glucose range is 110-130 mg/dl. Eye exam is current.      Review of Systems  Constitutional: Negative for malaise/fatigue.  Eyes: Negative for blurred vision.  Respiratory: Negative for shortness of breath.   All other systems reviewed and are negative.      Objective:   Physical Exam  Constitutional: He is oriented to person, place, and time. He appears well-developed and well-nourished. No distress.  HENT:  Head: Normocephalic.  Right Ear: External ear normal.  Left Ear: External ear normal.  Mouth/Throat: Oropharynx is clear and moist.  Eyes: Pupils are equal, round, and reactive to light. Right eye exhibits no discharge. Left eye exhibits no discharge.  Neck: Normal range of motion. Neck supple. No thyromegaly present.  Cardiovascular: Normal rate, regular rhythm, normal heart sounds and intact distal pulses.  No murmur heard. Pulmonary/Chest: Effort normal and breath sounds normal. No respiratory distress. He has no wheezes.  Abdominal: Soft. Bowel sounds are normal. He exhibits no distension. There is no tenderness.  Musculoskeletal: Normal range of motion. He exhibits no edema or tenderness.  Neurological: He is alert and oriented to person, place, and time. He has normal reflexes. No cranial nerve deficit.  Skin: Skin is warm and dry. No rash noted. No erythema.  Psychiatric: He has a normal mood and affect. His behavior is normal. Judgment and thought content normal.  Vitals reviewed.     BP (!) 154/78   Pulse (!) 55   Temp (!) 97 F (36.1 C) (Oral)   Ht '5\' 7"'  (1.702 m)   Wt 169 lb 3.2 oz (76.7 kg)   BMI 26.50 kg/m      Assessment & Plan:  Dalton Miller comes in today with chief complaint of Medical  Management of Chronic Issues   Diagnosis and orders addressed:  1. Type 2 diabetes mellitus with hyperglycemia, without long-term current use of insulin (HCC) - CMP14+EGFR - CBC with  Differential/Platelet - Bayer DCA Hb A1c Waived  2. Hypertension associated with diabetes ([place]) - CMP14+EGFR - CBC with Differential/Platelet  3. Hyperlipidemia associated with type 2 diabetes mellitus (HCC) - CMP14+EGFR - CBC with Differential/Platelet  4. Overweight (BMI 25.0-29.9) - CMP14+EGFR - CBC with Differential/Platelet  5. Tobacco user - CMP14+EGFR - CBC with Differential/Platelet   Labs pending Health Maintenance reviewed Diet and exercise encouraged  Follow up plan: 4 months    Evelina Dun, FNP

## 2018-03-19 ENCOUNTER — Ambulatory Visit (INDEPENDENT_AMBULATORY_CARE_PROVIDER_SITE_OTHER): Payer: Medicare HMO | Admitting: Family

## 2018-03-19 ENCOUNTER — Other Ambulatory Visit: Payer: Self-pay

## 2018-03-19 ENCOUNTER — Encounter: Payer: Self-pay | Admitting: Family

## 2018-03-19 VITALS — BP 126/73 | HR 56 | Temp 97.0°F | Ht 67.0 in | Wt 166.0 lb

## 2018-03-19 DIAGNOSIS — E1165 Type 2 diabetes mellitus with hyperglycemia: Secondary | ICD-10-CM

## 2018-03-19 DIAGNOSIS — E875 Hyperkalemia: Secondary | ICD-10-CM

## 2018-03-19 DIAGNOSIS — Z905 Acquired absence of kidney: Secondary | ICD-10-CM | POA: Diagnosis not present

## 2018-03-19 DIAGNOSIS — J449 Chronic obstructive pulmonary disease, unspecified: Secondary | ICD-10-CM

## 2018-03-19 DIAGNOSIS — E1169 Type 2 diabetes mellitus with other specified complication: Secondary | ICD-10-CM | POA: Diagnosis not present

## 2018-03-19 DIAGNOSIS — E1159 Type 2 diabetes mellitus with other circulatory complications: Secondary | ICD-10-CM

## 2018-03-19 DIAGNOSIS — E785 Hyperlipidemia, unspecified: Secondary | ICD-10-CM

## 2018-03-19 DIAGNOSIS — I10 Essential (primary) hypertension: Secondary | ICD-10-CM | POA: Diagnosis not present

## 2018-03-19 DIAGNOSIS — Z720 Tobacco use: Secondary | ICD-10-CM | POA: Diagnosis not present

## 2018-03-19 DIAGNOSIS — I152 Hypertension secondary to endocrine disorders: Secondary | ICD-10-CM

## 2018-03-19 DIAGNOSIS — E663 Overweight: Secondary | ICD-10-CM

## 2018-03-19 LAB — BAYER DCA HB A1C WAIVED: HB A1C (BAYER DCA - WAIVED): 6.1 % (ref <7.0)

## 2018-03-19 MED ORDER — FLUTICASONE PROPIONATE 50 MCG/ACT NA SUSP: 2.0000 | Freq: Every day | NASAL | 6 refills | Status: DC

## 2018-03-19 MED ORDER — TIOTROPIUM BROMIDE MONOHYDRATE 1.25 MCG/ACT IN AERS: 2.0000 | INHALATION_SPRAY | Freq: Every day | RESPIRATORY_TRACT | 6 refills | Status: DC

## 2018-03-19 NOTE — Progress Notes (Signed)
Subjective:    Patient ID: Dalton Miller, male    DOB: Apr 08, 1945, 73 y.o.   MRN: [id number]  Chief Complaint  Patient presents with  . 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 Has followed byUrologists every 6 months. Hypertension  This is a chronic problem. The current episode started more than 1 year ago. The problem has been resolved since onset. The problem is controlled. Pertinent negatives include no blurred vision, malaise/fatigue, peripheral edema or shortness of breath. Risk factors for coronary artery disease include dyslipidemia, diabetes mellitus, obesity, male gender, smoking/tobacco exposure and sedentary lifestyle. The current treatment provides moderate improvement. There is no history of kidney disease, CAD/MI, CVA or heart failure.  Hyperlipidemia  This is a chronic problem. The current episode started more than 1 year ago. The problem is controlled. Recent lipid tests were reviewed and are normal. Exacerbating diseases include obesity. Pertinent negatives include no shortness of breath. Current antihyperlipidemic treatment includes statins. The current treatment provides moderate improvement of lipids. Risk factors for coronary artery disease include dyslipidemia, diabetes mellitus, male sex, hypertension and a sedentary lifestyle.  Diabetes  He presents for his follow-up diabetic visit. He has type 2 diabetes mellitus. His disease course has been stable. Pertinent negatives for diabetes include no blurred vision, no foot paresthesias and no visual change. There are no diabetic complications. Pertinent negatives for diabetic complications include no CVA or heart disease. Risk factors for coronary artery disease include dyslipidemia, diabetes mellitus, male sex, hypertension, sedentary lifestyle and post-menopausal. His weight is  stable. He is following a diabetic diet. His overall blood glucose range is 90-110 mg/dl. Eye exam is current.  COPD PT continues to smoke 1 1/2 packs a day. States his breathing is stable.     Review of Systems  Constitutional: Negative for malaise/fatigue.  Eyes: Negative for blurred vision.  Respiratory: Negative for shortness of breath.   All other systems reviewed and are negative.      Objective:   Physical Exam Vitals signs reviewed.  Constitutional:      General: He is not in acute distress.    Appearance: He is well-developed.  HENT:     Head: Normocephalic.     Right Ear: External ear normal.     Left Ear: External ear normal.  Eyes:     General:        Right eye: No discharge.        Left eye: No discharge.     Pupils: Pupils are equal, round, and reactive to light.  Neck:     Musculoskeletal: Normal range of motion and neck supple.     Thyroid: No thyromegaly.  Cardiovascular:     Rate and Rhythm: Normal rate and regular rhythm.     Heart sounds: Normal heart sounds. No murmur.  Pulmonary:     Effort: Pulmonary effort is normal. No respiratory distress.     Breath sounds: Wheezing present.  Abdominal:     General: Bowel sounds are normal. There is no distension.     Palpations: Abdomen is soft.     Tenderness: There is no abdominal tenderness.  Musculoskeletal: Normal range of motion.        General: No tenderness.  Skin:    General: Skin is warm and dry.     Findings: No erythema or rash.  Neurological:  Mental Status: He is alert and oriented to person, place, and time.     Cranial Nerves: No cranial nerve deficit.     Deep Tendon Reflexes: Reflexes are normal and symmetric.  Psychiatric:        Behavior: Behavior normal.        Thought Content: Thought content normal.        Judgment: Judgment normal.       BP 126/73   Pulse (!) 56   Temp (!) 97 F (36.1 C) (Oral)   Ht '5\' 7"'  (1.702 m)   Wt 166 lb (75.3 kg)   BMI 26.00 kg/m       Assessment & Plan:  Dalton Miller comes in today with chief complaint of Medical Management of Chronic Issues (four month recheck)   Diagnosis and orders addressed:  1. Type 2 diabetes mellitus with hyperglycemia, without long-term current use of insulin (HCC) - CMP14+EGFR - CBC with Differential/Platelet - Bayer DCA Hb A1c Waived  2. Hypertension associated with diabetes ([place]) - CMP14+EGFR - CBC with Differential/Platelet  3. Hyperlipidemia associated with type 2 diabetes mellitus ([place]) - CMP14+EGFR - CBC with Differential/Platelet - Lipid panel  4. Overweight (BMI 25.0-29.9) - CMP14+EGFR - CBC with Differential/Platelet  5. Tobacco user - CMP14+EGFR - CBC with Differential/Platelet  6. Single kidney - CMP14+EGFR - CBC with Differential/Platelet  7. Chronic obstructive pulmonary disease, unspecified COPD type ([place]) We will start Spiriva and flonase today Smoking cessation discussed - Tiotropium Bromide Monohydrate (SPIRIVA RESPIMAT) 1.25 MCG/ACT AERS; Inhale 2 puffs into the lungs daily.  Dispense: 4 g; Refill: 6 - fluticasone (FLONASE) 50 MCG/ACT nasal spray; Place 2 sprays into both nostrils daily.  Dispense: 16 g; Refill: 6   Labs pending Health Maintenance reviewed-Refuses all vaccines today Diet and exercise encouraged  Follow up plan: 6 months    Dalton Dun, FNP

## 2018-03-19 NOTE — Patient Instructions (Signed)
Health Maintenance After Age 72 After age 72, you are at a higher risk for certain long-term diseases and infections as well as injuries from falls. Falls are a major cause of broken bones and head injuries in people who are older than age 72. Getting regular preventive care can help to keep you healthy and well. Preventive care includes getting regular testing and making lifestyle changes as recommended by your health care provider. Talk with your health care provider about:  Which screenings and tests you should have. A screening is a test that checks for a disease when you have no symptoms.  A diet and exercise plan that is right for you. What should I know about screenings and tests to prevent falls? Screening and testing are the best ways to find a health problem early. Early diagnosis and treatment give you the best chance of managing medical conditions that are common after age 72. Certain conditions and lifestyle choices may make you more likely to have a fall. Your health care provider may recommend:  Regular vision checks. Poor vision and conditions such as cataracts can make you more likely to have a fall. If you wear glasses, make sure to get your prescription updated if your vision changes.  Medicine review. Work with your health care provider to regularly review all of the medicines you are taking, including over-the-counter medicines. Ask your health care provider about any side effects that may make you more likely to have a fall. Tell your health care provider if any medicines that you take make you feel dizzy or sleepy.  Osteoporosis screening. Osteoporosis is a condition that causes the bones to get weaker. This can make the bones weak and cause them to break more easily.  Blood pressure screening. Blood pressure changes and medicines to control blood pressure can make you feel dizzy.  Strength and balance checks. Your health care provider may recommend certain tests to check your  strength and balance while standing, walking, or changing positions.  Foot health exam. Foot pain and numbness, as well as not wearing proper footwear, can make you more likely to have a fall.  Depression screening. You may be more likely to have a fall if you have a fear of falling, feel emotionally low, or feel unable to do activities that you used to do.  Alcohol use screening. Using too much alcohol can affect your balance and may make you more likely to have a fall. What actions can I take to lower my risk of falls? General instructions  Talk with your health care provider about your risks for falling. Tell your health care provider if: ? You fall. Be sure to tell your health care provider about all falls, even ones that seem minor. ? You feel dizzy, sleepy, or off-balance.  Take over-the-counter and prescription medicines only as told by your health care provider. These include any supplements.  Eat a healthy diet and maintain a healthy weight. A healthy diet includes low-fat dairy products, low-fat (lean) meats, and fiber from whole grains, beans, and lots of fruits and vegetables. Home safety  Remove any tripping hazards, such as rugs, cords, and clutter.  Install safety equipment such as grab bars in bathrooms and safety rails on stairs.  Keep rooms and walkways well-lit. Activity   Follow a regular exercise program to stay fit. This will help you maintain your balance. Ask your health care provider what types of exercise are appropriate for you.  If you need a cane or   walker, use it as recommended by your health care provider.  Wear supportive shoes that have nonskid soles. Lifestyle  Do not drink alcohol if your health care provider tells you not to drink.  If you drink alcohol, limit how much you have: ? 0-1 drink a day for women. ? 0-2 drinks a day for men.  Be aware of how much alcohol is in your drink. In the [place] one drink equals one typical bottle of beer (12  oz), one-half glass of wine (5 oz), or one shot of hard liquor (1 oz).  Do not use any products that contain nicotine or tobacco, such as cigarettes and e-cigarettes. If you need help quitting, ask your health care provider. Summary  Having a healthy lifestyle and getting preventive care can help to protect your health and wellness after age 72.  Screening and testing are the best way to find a health problem early and help you avoid having a fall. Early diagnosis and treatment give you the best chance for managing medical conditions that are more common for people who are older than age 72.  Falls are a major cause of broken bones and head injuries in people who are older than age 72. Take precautions to prevent a fall at home.  Work with your health care provider to learn what changes you can make to improve your health and wellness and to prevent falls. This information is not intended to replace advice given to you by your health care provider. Make sure you discuss any questions you have with your health care provider. Document Released: 11/02/2016 Document Revised: 11/02/2016 Document Reviewed: 11/02/2016 Elsevier Interactive Patient Education  2019 Elsevier Inc.  

## 2018-03-20 LAB — CBC WITH DIFFERENTIAL/PLATELET
Basophils Absolute: 0.1 10*3/uL (ref 0.0–0.2)
Basos: 1 %
EOS (ABSOLUTE): 0.1 10*3/uL (ref 0.0–0.4)
Eos: 2 %
Hematocrit: 43.6 % (ref 37.5–51.0)
Hemoglobin: 15.2 g/dL (ref 13.0–17.7)
Immature Grans (Abs): 0 10*3/uL (ref 0.0–0.1)
Immature Granulocytes: 0 %
Lymphocytes Absolute: 2 10*3/uL (ref 0.7–3.1)
Lymphs: 31 %
MCH: 32.4 pg (ref 26.6–33.0)
MCHC: 34.9 g/dL (ref 31.5–35.7)
MCV: 93 fL (ref 79–97)
MONOS ABS: 0.5 10*3/uL (ref 0.1–0.9)
Monocytes: 8 %
Neutrophils Absolute: 3.8 10*3/uL (ref 1.4–7.0)
Neutrophils: 58 %
Platelets: 166 10*3/uL (ref 150–450)
RBC: 4.69 x10E6/uL (ref 4.14–5.80)
RDW: 12.2 % (ref 11.6–15.4)
WBC: 6.5 10*3/uL (ref 3.4–10.8)

## 2018-03-20 LAB — CMP14+EGFR
A/G RATIO: 1.8 (ref 1.2–2.2)
ALBUMIN: 4.3 g/dL (ref 3.7–4.7)
ALT: 8 IU/L (ref 0–44)
AST: 18 IU/L (ref 0–40)
Alkaline Phosphatase: 74 IU/L (ref 39–117)
BUN / CREAT RATIO: 19 (ref 10–24)
BUN: 26 mg/dL (ref 8–27)
Bilirubin Total: 0.4 mg/dL (ref 0.0–1.2)
CALCIUM: 9.4 mg/dL (ref 8.6–10.2)
CHLORIDE: 105 mmol/L (ref 96–106)
CO2: 20 mmol/L (ref 20–29)
Creatinine, Ser: 1.34 mg/dL — ABNORMAL HIGH (ref 0.76–1.27)
GFR, EST AFRICAN AMERICAN: 61 mL/min/{1.73_m2} (ref >59)
GFR, EST NON AFRICAN AMERICAN: 53 mL/min/{1.73_m2} — AB (ref >59)
GLOBULIN, TOTAL: 2.4 g/dL (ref 1.5–4.5)
Glucose: 127 mg/dL — ABNORMAL HIGH (ref 65–99)
POTASSIUM: 6 mmol/L — AB (ref 3.5–5.2)
SODIUM: 140 mmol/L (ref 134–144)
TOTAL PROTEIN: 6.7 g/dL (ref 6.0–8.5)

## 2018-03-20 LAB — LIPID PANEL
Chol/HDL Ratio: 2.7 ratio (ref 0.0–5.0)
Cholesterol, Total: 123 mg/dL (ref 100–199)
HDL: 46 mg/dL (ref >39)
LDL Calculated: 65 mg/dL (ref 0–99)
TRIGLYCERIDES: 62 mg/dL (ref 0–149)
VLDL Cholesterol Cal: 12 mg/dL (ref 5–40)

## 2018-03-21 ENCOUNTER — Other Ambulatory Visit: Payer: Medicare HMO

## 2018-03-21 ENCOUNTER — Other Ambulatory Visit: Payer: Self-pay

## 2018-03-21 DIAGNOSIS — E875 Hyperkalemia: Secondary | ICD-10-CM | POA: Diagnosis not present

## 2018-03-21 LAB — CMP14+EGFR
A/G RATIO: 1.9 (ref 1.2–2.2)
ALT: 6 IU/L (ref 0–44)
AST: 15 IU/L (ref 0–40)
Albumin: 4.2 g/dL (ref 3.7–4.7)
Alkaline Phosphatase: 85 IU/L (ref 39–117)
BILIRUBIN TOTAL: 0.2 mg/dL (ref 0.0–1.2)
BUN/Creatinine Ratio: 17 (ref 10–24)
BUN: 21 mg/dL (ref 8–27)
CO2: 21 mmol/L (ref 20–29)
Calcium: 9.1 mg/dL (ref 8.6–10.2)
Chloride: 105 mmol/L (ref 96–106)
Creatinine, Ser: 1.21 mg/dL (ref 0.76–1.27)
GFR calc Af Amer: 69 mL/min/{1.73_m2} (ref >59)
GFR calc non Af Amer: 59 mL/min/{1.73_m2} — ABNORMAL LOW (ref >59)
Globulin, Total: 2.2 g/dL (ref 1.5–4.5)
Glucose: 110 mg/dL — ABNORMAL HIGH (ref 65–99)
Potassium: 5.1 mmol/L (ref 3.5–5.2)
Sodium: 140 mmol/L (ref 134–144)
Total Protein: 6.4 g/dL (ref 6.0–8.5)

## 2018-03-21 NOTE — Addendum Note (Signed)
Addended by: Shelbie Ammons on: 03/21/2018 08:43 AM   Modules accepted: Orders

## 2018-03-23 ENCOUNTER — Telehealth: Payer: Self-pay | Admitting: *Deleted

## 2018-03-23 ENCOUNTER — Other Ambulatory Visit: Payer: Self-pay | Admitting: Family

## 2018-03-23 MED ORDER — FLUTICASONE-SALMETEROL 100-50 MCG/DOSE IN AEPB: 1.0000 | INHALATION_SPRAY | Freq: Two times a day (BID) | RESPIRATORY_TRACT | 3 refills | Status: DC

## 2018-03-23 NOTE — Addendum Note (Signed)
Addended by: Evelina Dun A on: 03/23/2018 01:30 PM   Modules accepted: Orders

## 2018-03-23 NOTE — Telephone Encounter (Signed)
Spiriva changed to Advair per insurance.

## 2018-03-23 NOTE — Telephone Encounter (Signed)
Fax from [facility] requested Insurance requesting Advair in place of Spiriva Respimat 1.25 Please advise and send in new Rx if appropriate

## 2018-03-29 NOTE — Telephone Encounter (Signed)
Patient says he still can't afford the advair.  It was 200 dollars.  Is there anything else not as expensive.

## 2018-03-29 NOTE — Telephone Encounter (Signed)
Do we have any samples of Breo?

## 2018-03-29 NOTE — Addendum Note (Signed)
Addended by: Evelina Dun A on: 03/29/2018 04:51 PM   Modules accepted: Orders

## 2018-04-12 ENCOUNTER — Other Ambulatory Visit: Payer: Self-pay | Admitting: Family

## 2018-04-12 DIAGNOSIS — E782 Mixed hyperlipidemia: Secondary | ICD-10-CM

## 2018-04-12 DIAGNOSIS — I10 Essential (primary) hypertension: Secondary | ICD-10-CM

## 2018-04-19 ENCOUNTER — Encounter: Payer: Medicare HMO | Admitting: *Deleted

## 2018-05-08 ENCOUNTER — Ambulatory Visit (INDEPENDENT_AMBULATORY_CARE_PROVIDER_SITE_OTHER): Payer: Medicare HMO | Admitting: *Deleted

## 2018-05-08 ENCOUNTER — Other Ambulatory Visit: Payer: Self-pay

## 2018-05-08 ENCOUNTER — Encounter: Payer: Self-pay | Admitting: *Deleted

## 2018-05-08 DIAGNOSIS — Z0000 Encounter for general adult medical examination without abnormal findings: Secondary | ICD-10-CM

## 2018-05-08 NOTE — Progress Notes (Addendum)
MEDICARE ANNUAL WELLNESS VISIT  05/08/2018  Telephone Visit Disclaimer This Medicare AWV was conducted by telephone due to national recommendations for restrictions regarding Dalton COVID-19 Pandemic (e.g. social distancing).  I verified, using two identifiers, that I am speaking with Dalton Miller or their authorized healthcare agent. I discussed Dalton limitations, risks, security, and privacy concerns of performing an evaluation and management service by telephone and Dalton potential availability of an in-person appointment in Dalton future. Dalton patient expressed understanding and agreed to proceed.   Subjective:  Dalton Miller is a 73 y.o. male patient of Hawks, Theador Hawthorne, FNP who had a Medicare Annual Wellness Visit today via telephone. 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  His adult son lives with him, and they have a dog.  He has 3 sons, 11 grandchildren, and 3 great grandchildren.  he reports that he is socially active and does interact with friends/family regularly. he is minimally physically active and enjoys fishing and Community education officer.   Patient Care Team: Sharion Balloon, FNP as PCP - General (Nurse Practitioner) Cleon Gustin, MD as Consulting Physician (Urology)  Advanced Directives 05/08/2018 04/12/2017 03/03/2016 12/14/2015 12/04/2015 11/30/2015 10/28/2015  Does Patient Have a Medical Advance Directive? No No No No No No No  Would patient like information on creating a medical advance directive? Yes (MAU/Ambulatory/Procedural Areas - Information given) No - Patient declined Yes (MAU/Ambulatory/Procedural Areas - Information given) - No - Patient declined No - Patient declined No - patient declined information    Hospital Utilization Over Dalton Past 12 Months: # of hospitalizations or ER visits: 0 # of surgeries: 0  Review of Systems    Patient reports that his overall health is unchanged compared to last year.    Review of Systems:   All systems negative as reported  by patient.  Pain Assessment Pain Score: 0-No pain     Current Medications & Allergies (verified) Allergies as of 05/08/2018   No Known Allergies     Medication List       Accurate as of May 08, 2018 10:29 AM. Always use your most recent med list.        aspirin EC 81 MG tablet Take 1 tablet (81 mg total) by mouth daily.   atorvastatin 10 MG tablet Commonly known as:  LIPITOR TAKE 1 TABLET BY MOUTH EVERY DAY IN Dalton MORNING   fluticasone 50 MCG/ACT nasal spray Commonly known as:  FLONASE Place 2 sprays into both nostrils daily.   glucose blood test strip Check BS daily and as needed   lisinopril 10 MG tablet Commonly known as:  ZESTRIL TAKE 1 TABLET BY MOUTH EVERY DAY IN Dalton MORNING   Vitamin D (Ergocalciferol) 1.25 MG (50000 UT) Caps capsule Commonly known as:  DRISDOL Take 1 capsule (50,000 Units total) by mouth every 7 (seven) days.       History (reviewed): Past Medical History:  Diagnosis Date  . Acquired renal cyst of right kidney   . Cancer ([place])    renal cell   . Erectile dysfunction   . Heart murmur    CHILDHOOD  . Hematuria    left ureteral bleeding  . History of cellulitis    2012 left wrist  . History of kidney stones   . Hyperlipidemia   . Nephrolithiasis    bilateral non-obstructive per ct 07-02-2015  . Type 2 diabetes mellitus ([place])   . Wears glasses    Past Surgical History:  Procedure Laterality Date  . CARPAL TUNNEL RELEASE Right 1990  approx  . COLONOSCOPY  03/04/2010  . CYSTOSCOPY W/ RETROGRADES Left 10/28/2015   Procedure: CYSTOSCOPY WITH LEFT RETROGRADE PYELOGRAM;  Surgeon: Cleon Gustin, MD;  Location: [facility];  Service: Urology;  Laterality: Left;  . CYSTOSCOPY W/ URETERAL STENT PLACEMENT Left 10/28/2015   Procedure: CYSTOSCOPY WITH LEFT URETERAL STENT EXCHANGE;  Surgeon: Cleon Gustin, MD;  Location: [facility];  Service: Urology;  Laterality: Left;  . CYSTOSCOPY WITH RETROGRADE PYELOGRAM, URETEROSCOPY AND STENT  PLACEMENT Bilateral 09/17/2015   Procedure: CYSTOSCOPY WITH BILATERAL RETROGRADE PYELOGRAM, LEFT URETEROSCOPY WITH URETERAL BIOPSY AND  STENT PLACEMENT;  Surgeon: Irine Seal, MD;  [facility];  Service: Urology;  Laterality: Bilateral;  . HOLMIUM LASER APPLICATION Left 04/29/8339   Procedure: HOLMIUM LASER APPLICATION;  Surgeon: Irine Seal, MD;  Location: [facility];  Service: Urology;  Laterality: Left;  . KNEE ARTHROSCOPY Right 2008  . MELANOMA EXCISION  2018   Left arm  . right total knee replacement  Right    2009  . ROBOT ASSITED LAPAROSCOPIC NEPHROURETERECTOMY Left 12/04/2015   Procedure: XI ROBOT ASSITED LAPAROSCOPIC NEPHROURETERECTOMY;  Surgeon: Cleon Gustin, MD;  Location: [facility];  Service: Urology;  Laterality: Left;  . STONE EXTRACTION WITH BASKET Left 09/17/2015   Procedure: STONE EXTRACTION WITH BASKET;  Surgeon: Irine Seal, MD;  Location[facility];  Service: Urology;  Laterality: Left;  . TOTAL KNEE ARTHROPLASTY Right 2009  . URETEROSCOPY  10/28/2015   Procedure: DIAGNOSTIC LEFT URETEROSCOPY;  Surgeon: Cleon Gustin, MD;  Location: [facility];  Service: Urology;;   Family History  Problem Relation Age of Onset  . Diabetes Mother   . Heart disease Mother   . Stroke Mother   . CAD Mother   . Diabetes Father   . Congestive Heart Failure Father   . Cancer Brother   . Heart disease Son   . COPD Son   . Emphysema Son   . Heart attack Brother    Social History   Socioeconomic History  . Marital status: Widowed    Spouse name: Not on file  . Number of children: 3  . Years of education: Not on file  . Highest education level: 8th grade  Occupational History  . 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 Financial resource strain: Not hard at all  . Food insecurity:    Worry: Never true    Inability: Never true  . Transportation needs:    Medical: No     Non-medical: No  Tobacco Use  . Smoking status: Current Every Day Smoker    Packs/day: 1.50    Years: 60.00    Pack years: 90.00    Types: Cigars  . Smokeless tobacco: Former Systems developer    Types: Chew    Quit date: 01/03/1993  . Tobacco comment: Quit smoking cigarettes over 10 years ago- smoke 1.5 pack per day 60 years  Substance and Sexual Activity  . Alcohol use: No  . Drug use: No  . Sexual activity: Never  Lifestyle  . Physical activity:    Days per week: 0 days    Minutes per session: 0 min  . Stress: Not at all  Relationships  . Social connections:    Talks on phone: More than three times a week    Gets together: More than three times a week  Attends religious service: Never    Active member of club or organization: No    Attends meetings of clubs or organizations: Never    Relationship status: Widowed  Other Topics Concern  . Not on file  Social History Narrative  . Not on file    Activities of Daily Living In your present state of health, do you have any difficulty performing Dalton following activities: 05/08/2018  Hearing? Y  Comment Hearing loss experienced in [employer], had hearing checked at [place]? N  Difficulty concentrating or making decisions? N  Walking or climbing stairs? Y  Comment Trouble climbing stairs due to right knee problems  Dressing or bathing? N  Doing errands, shopping? N  Preparing Food and eating ? N  Using Dalton Toilet? N  In Dalton past six months, have you accidently leaked urine? N  Do you have problems with loss of bowel control? N  Managing your Medications? N  Managing your Finances? N  Housekeeping or managing your Housekeeping? N  Some recent data might be hidden        Exercise Current Exercise Habits: Dalton patient does not participate in regular exercise at present, Exercise limited by: orthopedic condition(s)  Diet Patient reports consuming 1 meals a day and 1 snack(s) a day Patient reports that his primary diet is: Regular  Patient reports that she does have regular access to food.  Patient states he eats when he is hungry, and this is usually around 5 pm daily.  He has peanut butter crackers midday.  He states this has been his routine for at least 40 years, and he feels Dalton best when eating on this schedule.  Encouraged patient to increase non-starchy vegetable and fruit intake daily.  Depression Screen PHQ 2/9 Scores 05/08/2018 03/19/2018 11/16/2017 08/14/2017 04/14/2017 04/12/2017 03/22/2016  PHQ - 2 Score 0 0 0 0 0 0 1     Fall Risk Fall Risk  05/08/2018 03/19/2018 11/16/2017 08/14/2017 04/14/2017  Falls in Dalton past year? 0 0 0 No No  Follow up Falls prevention discussed;Education provided - - - -     Objective:  Dalton Miller seemed alert and oriented and he participated appropriately during our telephone visit.  Blood Pressure Weight BMI  BP Readings from Last 3 Encounters:  03/19/18 126/73  11/16/17 (!) 148/76  08/14/17 127/72   Wt Readings from Last 3 Encounters:  03/19/18 166 lb (75.3 kg)  11/16/17 169 lb 3.2 oz (76.7 kg)  08/14/17 172 lb 6.4 oz (78.2 kg)   BMI Readings from Last 1 Encounters:  03/19/18 26.00 kg/m    *Unable to obtain current vital signs, weight, and BMI due to telephone visit type  Hearing/Vision  . Dalton Miller did not seem to have difficulty with hearing/understanding during Dalton telephone conversation . Reports that he has not had a formal eye exam by an eye care professional within Dalton past year.  Was due 04/06/2018- will schedule when COVID 19 restrictions are lifted. . Reports that he has not had a formal hearing evaluation within Dalton past year.  Had hearing evaluation 2 years ago at [place], did not need hearing aids. *Unable to fully assess hearing and vision during telephone visit type  Cognitive Function: 6CIT Screen 05/08/2018  What Year? 0 points  What month? 0 points  What time? 0 points  Count back from 20 0 points  Months in reverse 0 points  Repeat phrase 4 points  Total  Score 4    Normal Cognitive  Function Screening: Yes (Normal:0-7, Significant for Dysfunction: >8)  Immunization & Health Maintenance Record Immunization History  Administered Date(s) Administered  . Influenza Whole 10/04/2007  . Pneumococcal Conjugate-13 05/26/2014  . Pneumococcal Polysaccharide-23 10/04/2007, 07/27/2015  . Tdap 01/03/2006    Health Maintenance  Topic Date Due  . OPHTHALMOLOGY EXAM  04/06/2018  . FOOT EXAM  04/15/2018  . TETANUS/TDAP  11/17/2023 (Originally 01/04/2016)  . INFLUENZA VACCINE  08/04/2018  . HEMOGLOBIN A1C  09/19/2018  . COLONOSCOPY  03/03/2020  . Hepatitis C Screening  Completed  . PNA vac Low Risk Adult  Completed   Patient plans to schedule eye exam when COVID 19 restrictions are lifted.  Recommend foot exam at next visit with Evelina Dun, FNP. Advised patient to check with [place] [facility] regarding getting Dalton tdap and Shingrix vaccines there for no charge.      Assessment  This is a routine wellness examination for Dalton Miller.  Health Maintenance: Due or Overdue Health Maintenance Due  Topic Date Due  . OPHTHALMOLOGY EXAM  04/06/2018  . FOOT EXAM  04/15/2018      Dalton Miller does not need a referral for Community Assistance: Care Management:   no Social Work:    no Prescription Assistance:  no Nutrition/Diabetes Education:  no   Plan:  Personalized Goals Goals Addressed            This Visit's Progress   . DIET - EAT MORE FRUITS AND VEGETABLES   Not on track   . Exercise 150 min/wk Moderate Activity   Not on track    Walking is a great option or riding stationary bicycle are great options.      Personalized Health Maintenance & Screening Recommendations  Td vaccine Smoking cessation counseling Advanced directives: has NO advanced directive  - add't info requested. Referral to SW: no  Lung Cancer Screening Recommended: yes (Low Dose CT Chest recommended if Age 15-80 years, 30 pack-year currently smoking OR have quit w/in  past 15 years) Hepatitis C Screening recommended: completed 10/21/2014   Advanced Directives: Written information was prepared per patient's request.  Mailed to patient  Referrals & Orders   Follow-up Plan . Follow-up with Sharion Balloon, FNP as planned . Schedule eye exam . [facility] Shing[facility]   I have personally reviewed and noted Dalton following in Dalton patient's chart:   . Medical and social history . Use of alcohol, tobacco or illicit drugs  . Current medications and supplements . Functional ability and status . Nutritional status . Physical activity . Advanced directives . List of other physicians . Hospitalizations, surgeries, and ER visits in previous 12 months . Vitals . Screenings to include cognitive, depression, and falls . Referrals and appointments  In addition, I have reviewed and discussed with Dalton Miller certain preventive protocols, quality metrics, and best practice recommendations. A written personalized care plan for preventive services as well as general preventive health recommendations is available and can be mailed to Dalton patient at his request.      Nolberto Hanlon, RN 05/08/2018   I have reviewed and agree with Dalton above  documentation.   Evelina Dun, FNP

## 2018-05-08 NOTE — Patient Instructions (Signed)
  [name] , Thank you for taking time to talk with me for your Medicare Wellness Visit. I appreciate your ongoing commitment to your health goals. Please review the following plan we discussed and let me know if I can assist you in the future.   Please check with the [facility] regarding getting the Tdap (tetanus, diptheria, and pertussis) and Shingrix (Shingles) vaccines.  If you are able to get these vaccines at the [place]- please bring a copy of immunization record so we can update your medical record.   Please review the information given on Advance Directives.  If you complete the paperwork, please bring a copy to our office to be filed in your medical record.   These are the goals we discussed: Goals    . DIET - EAT MORE FRUITS AND VEGETABLES    . Exercise 150 min/wk Moderate Activity     Walking and riding stationary bicycle are great options.       This is a list of the screening recommended for you and due dates:  Health Maintenance  Topic Date Due  . Eye exam for diabetics  04/06/2018  . Complete foot exam   04/15/2018  . Tetanus Vaccine  11/17/2023*  . Flu Shot  08/04/2018  . Hemoglobin A1C  09/19/2018  . Colon Cancer Screening  03/03/2020  .  Hepatitis C: One time screening is recommended by Center for Disease Control  (CDC) for  adults born from [birth year] through 1964.   Completed  . Pneumonia vaccines  Completed  *Topic was postponed. The date shown is not the original due date.

## 2018-08-15 ENCOUNTER — Encounter: Payer: Medicare HMO | Admitting: *Deleted

## 2018-09-14 ENCOUNTER — Other Ambulatory Visit: Payer: Self-pay | Admitting: Family

## 2018-09-14 DIAGNOSIS — J449 Chronic obstructive pulmonary disease, unspecified: Secondary | ICD-10-CM

## 2018-09-21 ENCOUNTER — Ambulatory Visit (INDEPENDENT_AMBULATORY_CARE_PROVIDER_SITE_OTHER): Payer: Medicare HMO | Admitting: Family

## 2018-09-21 ENCOUNTER — Encounter: Payer: Self-pay | Admitting: Family

## 2018-09-21 DIAGNOSIS — Z720 Tobacco use: Secondary | ICD-10-CM

## 2018-09-21 DIAGNOSIS — E785 Hyperlipidemia, unspecified: Secondary | ICD-10-CM

## 2018-09-21 DIAGNOSIS — E559 Vitamin D deficiency, unspecified: Secondary | ICD-10-CM | POA: Diagnosis not present

## 2018-09-21 DIAGNOSIS — E1169 Type 2 diabetes mellitus with other specified complication: Secondary | ICD-10-CM | POA: Diagnosis not present

## 2018-09-21 DIAGNOSIS — J449 Chronic obstructive pulmonary disease, unspecified: Secondary | ICD-10-CM

## 2018-09-21 DIAGNOSIS — E1159 Type 2 diabetes mellitus with other circulatory complications: Secondary | ICD-10-CM

## 2018-09-21 DIAGNOSIS — E1165 Type 2 diabetes mellitus with hyperglycemia: Secondary | ICD-10-CM

## 2018-09-21 DIAGNOSIS — I152 Hypertension secondary to endocrine disorders: Secondary | ICD-10-CM

## 2018-09-21 DIAGNOSIS — D414 Neoplasm of uncertain behavior of bladder: Secondary | ICD-10-CM | POA: Diagnosis not present

## 2018-09-21 DIAGNOSIS — E663 Overweight: Secondary | ICD-10-CM | POA: Diagnosis not present

## 2018-09-21 DIAGNOSIS — I10 Essential (primary) hypertension: Secondary | ICD-10-CM | POA: Diagnosis not present

## 2018-09-21 NOTE — Progress Notes (Signed)
Virtual Visit via telephone Note Due to COVID-19 pandemic this visit was conducted virtually. This visit type was conducted due to national recommendations for restrictions regarding the COVID-19 Pandemic (e.g. social distancing, sheltering in place) in an effort to limit this patient's exposure and mitigate transmission in our community. All issues noted in this document were discussed and addressed.  A physical exam was not performed with this format.  I connected with Dalton Miller on 09/21/18 at 8:17 AM by telephone and verified that I am speaking with the correct person using two identifiers. Dalton Miller is currently located at home and no one is currently with him during visit. The provider, Evelina Dun, FNP is located in their office at time of visit.  I discussed the limitations, risks, security and privacy concerns of performing an evaluation and management service by telephone and the availability of in person appointments. I also discussed with the patient that there may be a patient responsible charge related to this service. The patient expressed understanding and agreed to proceed.   History and Present Illness:  Pt calls the office today for chronic follow up. He states he had several polyps removed from his bladder on 09/18/18 at the [place]. He is following up with them on Monday to find out about the biopsy.  Hypertension This is a chronic problem. The current episode started more than 1 year ago. The problem has been resolved since onset. The problem is controlled. Pertinent negatives include no blurred vision, headaches, malaise/fatigue, peripheral edema or shortness of breath. Risk factors for coronary artery disease include dyslipidemia, diabetes mellitus, obesity, male gender, sedentary lifestyle and smoking/tobacco exposure. The current treatment provides moderate improvement. There is no history of kidney disease, CAD/MI, CVA or heart failure.  Hyperlipidemia This is a chronic  problem. The current episode started more than 1 year ago. Recent lipid tests were reviewed and are normal. He has no history of diabetes. Factors aggravating his hyperlipidemia include smoking. Pertinent negatives include no shortness of breath. Current antihyperlipidemic treatment includes statins. The current treatment provides moderate improvement of lipids. Risk factors for coronary artery disease include dyslipidemia, diabetes mellitus, male sex, hypertension and a sedentary lifestyle.  Nicotine Dependence Presents for follow-up visit. His urge triggers include company of smokers. Number of cigarettes per day: 1 1/2  pack a day.  Diabetes He presents for his follow-up diabetic visit. He has type 2 diabetes mellitus. His disease course has been stable. There are no hypoglycemic associated symptoms. Pertinent negatives for hypoglycemia include no headaches. Pertinent negatives for diabetes include no blurred vision and no foot paresthesias. There are no hypoglycemic complications. Pertinent negatives for diabetic complications include no CVA. Risk factors for coronary artery disease include family history, dyslipidemia, diabetes mellitus, male sex, obesity, hypertension and sedentary lifestyle. His overall blood glucose range is 110-130 mg/dl. Eye exam is not current (has appt on 10/02/18).  COPD PT continues to smoke 1 1/2 packs a day. Currently does not use inhalers at this time.     Review of Systems  Constitutional: Negative for malaise/fatigue.  Eyes: Negative for blurred vision.  Respiratory: Negative for shortness of breath.   Neurological: Negative for headaches.  All other systems reviewed and are negative.    Observations/Objective: No SOB or distress noted   Assessment and Plan: Dalton Miller comes in today with chief complaint of No chief complaint on file.   Diagnosis and orders addressed:  1. Hypertension associated with diabetes ([place])  2. Chronic obstructive  pulmonary  disease, unspecified COPD type ([place])  3. Hyperlipidemia associated with type 2 diabetes mellitus ([place])  4. Type 2 diabetes mellitus with hyperglycemia, without long-term current use of insulin ([facility])  5. Tobacco user Smoking cessation discussed  6. Overweight (BMI 25.0-29.9)  7. Vitamin D deficiency  8. Bladder polyps Keep F/U with Urologists  Labs reviewed and just had [facility] Maintenance reviewed Diet and exercise encouraged  Follow up plan: 6 months [facility]       I discussed the assessment and treatment plan with the patient. The patient was provided an opportunity to ask questions and all were answered. The patient agreed with the plan and demonstrated an understanding of the instructions.   The patient was advised to call back or seek an in-person evaluation if the symptoms worsen or if the condition fails to improve as anticipated.  The above assessment and management plan was discussed with the patient. The patient verbalized understanding of and has agreed to the management plan. Patient is aware to call the clinic if symptoms persist or worsen. Patient is aware when to return to the clinic for a follow-up visit. Patient educated on when it is appropriate to go to the emergency department.   Time call ended:  8:30 AM   I provided 13 minutes of non-face-to-face time during this encounter.    Evelina Dun, FNP

## 2018-10-02 DIAGNOSIS — H2513 Age-related nuclear cataract, bilateral: Secondary | ICD-10-CM | POA: Diagnosis not present

## 2018-10-02 DIAGNOSIS — I10 Essential (primary) hypertension: Secondary | ICD-10-CM | POA: Diagnosis not present

## 2018-10-02 DIAGNOSIS — E119 Type 2 diabetes mellitus without complications: Secondary | ICD-10-CM | POA: Diagnosis not present

## 2018-10-02 DIAGNOSIS — E7800 Pure hypercholesterolemia, unspecified: Secondary | ICD-10-CM | POA: Diagnosis not present

## 2018-10-02 DIAGNOSIS — H5200 Hypermetropia, unspecified eye: Secondary | ICD-10-CM | POA: Diagnosis not present

## 2018-10-02 DIAGNOSIS — Z0100 Encounter for examination of eyes and vision without abnormal findings: Secondary | ICD-10-CM | POA: Diagnosis not present

## 2018-10-02 LAB — HM DIABETES EYE EXAM

## 2018-10-06 ENCOUNTER — Other Ambulatory Visit: Payer: Self-pay | Admitting: Family

## 2018-10-06 DIAGNOSIS — I10 Essential (primary) hypertension: Secondary | ICD-10-CM

## 2018-10-06 DIAGNOSIS — E782 Mixed hyperlipidemia: Secondary | ICD-10-CM

## 2019-01-21 ENCOUNTER — Encounter (HOSPITAL_COMMUNITY): Payer: Self-pay

## 2019-01-21 ENCOUNTER — Other Ambulatory Visit: Payer: Self-pay

## 2019-01-21 ENCOUNTER — Emergency Department (HOSPITAL_COMMUNITY)
Admission: EM | Admit: 2019-01-21 | Discharge: 2019-01-21 | Discharge status: Against Medical Advice | Payer: Medicare Other | Attending: Emergency Medicine | Admitting: Emergency Medicine

## 2019-01-21 ENCOUNTER — Emergency Department (HOSPITAL_COMMUNITY): Payer: Medicare Other

## 2019-01-21 DIAGNOSIS — J449 Chronic obstructive pulmonary disease, unspecified: Secondary | ICD-10-CM | POA: Diagnosis not present

## 2019-01-21 DIAGNOSIS — Z7982 Long term (current) use of aspirin: Secondary | ICD-10-CM | POA: Diagnosis not present

## 2019-01-21 DIAGNOSIS — Z79899 Other long term (current) drug therapy: Secondary | ICD-10-CM | POA: Diagnosis not present

## 2019-01-21 DIAGNOSIS — I10 Essential (primary) hypertension: Secondary | ICD-10-CM | POA: Insufficient documentation

## 2019-01-21 DIAGNOSIS — F17210 Nicotine dependence, cigarettes, uncomplicated: Secondary | ICD-10-CM | POA: Insufficient documentation

## 2019-01-21 DIAGNOSIS — Z96651 Presence of right artificial knee joint: Secondary | ICD-10-CM | POA: Diagnosis not present

## 2019-01-21 DIAGNOSIS — E119 Type 2 diabetes mellitus without complications: Secondary | ICD-10-CM | POA: Diagnosis not present

## 2019-01-21 DIAGNOSIS — R079 Chest pain, unspecified: Secondary | ICD-10-CM

## 2019-01-21 DIAGNOSIS — R0789 Other chest pain: Secondary | ICD-10-CM | POA: Diagnosis not present

## 2019-01-21 LAB — CBC WITH DIFFERENTIAL/PLATELET
Abs Immature Granulocytes: 0.02 10*3/uL (ref 0.00–0.07)
Basophils Absolute: 0.1 10*3/uL (ref 0.0–0.1)
Basophils Relative: 1 %
Eosinophils Absolute: 0.1 10*3/uL (ref 0.0–0.5)
Eosinophils Relative: 2 %
HCT: 49.2 % (ref 39.0–52.0)
Hemoglobin: 16 g/dL (ref 13.0–17.0)
Immature Granulocytes: 0 %
Lymphocytes Relative: 27 %
Lymphs Abs: 2.2 10*3/uL (ref 0.7–4.0)
MCH: 32.3 pg (ref 26.0–34.0)
MCHC: 32.5 g/dL (ref 30.0–36.0)
MCV: 99.2 fL (ref 80.0–100.0)
Monocytes Absolute: 0.8 10*3/uL (ref 0.1–1.0)
Monocytes Relative: 10 %
Neutro Abs: 4.8 10*3/uL (ref 1.7–7.7)
Neutrophils Relative %: 60 %
Platelets: 160 10*3/uL (ref 150–400)
RBC: 4.96 MIL/uL (ref 4.22–5.81)
RDW: 13 % (ref 11.5–15.5)
WBC: 8 10*3/uL (ref 4.0–10.5)
nRBC: 0 % (ref 0.0–0.2)

## 2019-01-21 LAB — BASIC METABOLIC PANEL
Anion gap: 7 (ref 5–15)
BUN: 23 mg/dL (ref 8–23)
CO2: 24 mmol/L (ref 22–32)
Calcium: 9 mg/dL (ref 8.9–10.3)
Chloride: 104 mmol/L (ref 98–111)
Creatinine, Ser: 1.18 mg/dL (ref 0.61–1.24)
GFR calc Af Amer: >60 mL/min (ref >60)
GFR calc non Af Amer: >60 mL/min (ref >60)
Glucose, Bld: 156 mg/dL — ABNORMAL HIGH (ref 70–99)
Potassium: 4.6 mmol/L (ref 3.5–5.1)
Sodium: 135 mmol/L (ref 135–145)

## 2019-01-21 LAB — HEPATIC FUNCTION PANEL
ALT: 12 U/L (ref 0–44)
AST: 15 U/L (ref 15–41)
Albumin: 3.8 g/dL (ref 3.5–5.0)
Alkaline Phosphatase: 70 U/L (ref 38–126)
Bilirubin, Direct: 0.1 mg/dL (ref 0.0–0.2)
Indirect Bilirubin: 0.6 mg/dL (ref 0.3–0.9)
Total Bilirubin: 0.7 mg/dL (ref 0.3–1.2)
Total Protein: 7.1 g/dL (ref 6.5–8.1)

## 2019-01-21 LAB — TROPONIN I (HIGH SENSITIVITY)
Troponin I (High Sensitivity): 4 ng/L (ref <18)
Troponin I (High Sensitivity): 4 ng/L (ref <18)

## 2019-01-21 LAB — D-DIMER, QUANTITATIVE: D-Dimer, Quant: 0.65 ug/mL-FEU — ABNORMAL HIGH (ref 0.00–0.50)

## 2019-01-21 MED ORDER — LIDOCAINE VISCOUS HCL 2 % MT SOLN
15.0000 mL | Freq: Once | OROMUCOSAL | Status: DC
  Filled 2019-01-21: qty 15

## 2019-01-21 MED ORDER — NITROGLYCERIN 0.4 MG SL SUBL
0.4000 mg | SUBLINGUAL_TABLET | Freq: Once | SUBLINGUAL | Status: AC
  Administered 2019-01-21: 0.4 mg via SUBLINGUAL
  Filled 2019-01-21: qty 1

## 2019-01-21 MED ORDER — ALUM & MAG HYDROXIDE-SIMETH 200-200-20 MG/5ML PO SUSP
30.0000 mL | Freq: Once | ORAL | Status: DC
  Filled 2019-01-21: qty 30

## 2019-01-21 MED ORDER — MORPHINE SULFATE (PF) 4 MG/ML IV SOLN
4.0000 mg | Freq: Once | INTRAVENOUS | Status: AC
  Administered 2019-01-21: 4 mg via INTRAVENOUS
  Filled 2019-01-21: qty 1

## 2019-01-21 NOTE — ED Provider Notes (Signed)
[facility] EMERGENCY DEPARTMENT Provider Note   CSN: [id number] Arrival date & time: 01/21/19  1221     History Chief Complaint  Patient presents with  . Chest Pain    ENDRIT GITTINS is a 74 y.o. male with PMHx diabetes (controlled with diet), COPD, HTN who presents to the ED today complaining of sudden onset, constant, 10/10, sharp, substernal chest pain that began around 5 AM today.  She reports the pain woke him out of his sleep.  He states that he tried to deal with that at home by laying down and resting but the pain is exacerbated with this.  He states he mentioned that he was having chest pain to his family and they told him to come to the ED immediately.  Patient had 325 mg aspirin and 2 nitro in route.  He states he is still having chest pain currently.  States he has never had chest pain like this before.  He does have family history and reports that his father died of heart attack when he was in his 4s or 78s although he is unsure.  Is a current everyday smoker, states he smokes 1.5 packs/day states he has been smoking for 77 years since he was 74 years old.  She reports he had a stress test done a few years ago which was normal, he is never had to have a cardiac catheterization done.  Patient denies fever, chills, cough, shortness of breath, diaphoresis, leg swelling, nausea, vomiting, any other associated symptoms.   The history is provided by the patient.       Past Medical History:  Diagnosis Date  . Acquired renal cyst of right kidney   . Cancer ([employer])    renal cell   . Erectile dysfunction   . Heart murmur    CHILDHOOD  . Hematuria    left ureteral bleeding  . History of cellulitis    2012 left wrist  . History of kidney stones   . Hyperlipidemia   . Nephrolithiasis    bilateral non-obstructive per ct 07-02-2015  . Type 2 diabetes mellitus ([place])   . Wears glasses     Patient Active Problem List   Diagnosis Date Noted  . Bladder polyps 09/21/2018  . COPD  (chronic obstructive pulmonary disease) ([place]) 03/19/2018  . Single kidney 03/22/2016  . 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 Overweight (BMI 25.0-29.9) 07/27/2015  . Vitamin D deficiency 02/16/2014  . Hypertension associated with diabetes ([place]) 02/14/2014  . Tobacco user   . Erectile dysfunction   . Diabetes mellitus ([place]) 03/22/2010  . Hyperlipidemia associated with type 2 diabetes mellitus ([place]) 03/22/2010    Past Surgical History:  Procedure Laterality Date  . CARPAL TUNNEL RELEASE Right 1990  approx  . COLONOSCOPY  03/04/2010  . CYSTOSCOPY W/ RETROGRADES Left 10/28/2015   Procedure: CYSTOSCOPY WITH LEFT RETROGRADE PYELOGRAM;  Surgeon: Cleon Gustin, MD;  Location: [facility];  Service: Urology;  Laterality: Left;  . CYSTOSCOPY W/ URETERAL STENT PLACEMENT Left 10/28/2015   Procedure: CYSTOSCOPY WITH LEFT URETERAL STENT EXCHANGE;  Surgeon: Cleon Gustin, MD;  Location: [facility];  Service: Urology;  Laterality: Left;  . CYSTOSCOPY WITH RETROGRADE PYELOGRAM, URETEROSCOPY AND STENT PLACEMENT Bilateral 09/17/2015   Procedure: CYSTOSCOPY WITH BILATERAL RETROGRADE PYELOGRAM, LEFT URETEROSCOPY WITH URETERAL BIOPSY AND  STENT PLACEMENT;  Surgeon: Irine Seal, MD;  Location: [facility];  Service: Urology;  Laterality: Bilateral;  . HOLMIUM LASER APPLICATION Left 1/95/0932  Procedure: HOLMIUM LASER APPLICATION;  Surgeon: Irine Seal, MD;  Location: [facility];  Service: Urology;  Laterality: Left;  . KNEE ARTHROSCOPY Right 2008  . MELANOMA EXCISION  2018   Left arm  . right total knee replacement  Right    2009  . ROBOT ASSITED LAPAROSCOPIC NEPHROURETERECTOMY Left 12/04/2015   Procedure: XI ROBOT ASSITED LAPAROSCOPIC NEPHROURETERECTOMY;  Surgeon: Cleon Gustin, MD;  Location: [facility];  Service: Urology;  Laterality: Left;  . STONE EXTRACTION WITH BASKET Left 09/17/2015   Procedure: STONE EXTRACTION WITH BASKET;  Surgeon: Irine Seal, MD;  Location: [facility];  Service: Urology;  Laterality: Left;  . TOTAL KNEE ARTHROPLASTY Right 2009  . URETEROSCOPY  10/28/2015   Procedure: DIAGNOSTIC LEFT URETEROSCOPY;  Surgeon: Cleon Gustin, MD;  Location: [facility];  Service: Urology;;       Family History  Problem Relation Age of Onset  . Diabetes Mother   . Heart disease Mother   . Stroke Mother   . CAD Mother   . Diabetes Father   . Congestive Heart Failure Father   . Cancer Brother   . Heart disease Son   . COPD Son   . Emphysema Son   . Heart attack Brother     Social History   Tobacco Use  . Smoking status: Current Every Day Smoker    Packs/day: 1.50    Years: 60.00    Pack years: 90.00    Types: Cigars  . Smokeless tobacco: Former Systems developer    Types: Chew    Quit date: 01/03/1993  . Tobacco comment: Quit smoking cigarettes over 10 years ago- smoke 1.5 pack per day 60 years  Substance Use Topics  . Alcohol use: No  . Drug use: No    Home Medications Prior to Admission medications   Medication Sig Start Date End Date Taking? Authorizing Provider  aspirin EC 81 MG tablet Take 1 tablet (81 mg total) by mouth daily. 03/22/16   Evelina Dun A, FNP  atorvastatin (LIPITOR) 10 MG tablet TAKE 1 TABLET BY MOUTH EVERY DAY IN THE MORNING 10/08/18   Evelina Dun A, FNP  fluticasone [facility]) 50 MCG/ACT nasal spray SPRAY 2 SPRAYS INTO EACH NOSTRIL EVERY DAY 09/14/18   Evelina Dun A, FNP  glucose blood test strip Check BS daily and as needed 11/15/16   Evelina Dun A, FNP  lisinopril (ZESTRIL) 10 MG tablet TAKE 1 TABLET BY MOUTH EVERY DAY IN THE MORNING 10/08/18   Evelina Dun A, FNP  Vitamin D, Ergocalciferol, (DRISDOL) 50000 units CAPS capsule Take 1 capsule (50,000 Units total) by mouth every 7 (seven) days. Patient not taking: Reported on 05/08/2018 08/17/17   Sharion Balloon, FNP    Allergies    Patient has no known allergies.  Review of Systems   Review of Systems  Constitutional:  Negative for chills and fever.  Respiratory: Negative for cough and shortness of breath.   Cardiovascular: Positive for chest pain. Negative for palpitations and leg swelling.  Gastrointestinal: Negative for abdominal pain, nausea and vomiting.  Musculoskeletal: Negative for back pain.  All other systems reviewed and are negative.   Physical Exam Updated Vital Signs BP 115/73   Pulse 66   Temp 98.2 F (36.8 C) (Oral)   Resp 19   Ht 5\' 7"  (1.702 m)   Wt 73.9 kg   SpO2 97%   BMI 25.53 kg/m   Physical Exam Vitals and nursing note reviewed.  Constitutional:  Appearance: He is not ill-appearing or diaphoretic.  HENT:     Head: Normocephalic and atraumatic.  Eyes:     Conjunctiva/sclera: Conjunctivae normal.  Cardiovascular:     Rate and Rhythm: Normal rate and regular rhythm.     Pulses:          Radial pulses are 2+ on the right side and 2+ on the left side.       Dorsalis pedis pulses are 2+ on the right side and 2+ on the left side.  Pulmonary:     Effort: Pulmonary effort is normal.     Breath sounds: Normal breath sounds. No decreased breath sounds, wheezing, rhonchi or rales.  Chest:     Chest wall: Tenderness present.    Abdominal:     Palpations: Abdomen is soft.     Tenderness: There is no abdominal tenderness. There is no guarding or rebound.  Musculoskeletal:     Cervical back: Neck supple.     Right lower leg: No edema.     Left lower leg: No edema.  Skin:    General: Skin is warm and dry.  Neurological:     Mental Status: He is alert.     ED Results / Procedures / Treatments   Labs (all labs ordered are listed, but only abnormal results are displayed) Labs Reviewed  BASIC METABOLIC PANEL - Abnormal; Notable for the following components:      Result Value   Glucose, Bld 156 (*)    All other components within normal limits  D-DIMER, QUANTITATIVE (NOT AT [facility]) - Abnormal; Notable for the following components:   D-Dimer, Quant 0.65 (*)    All  other components within normal limits  CBC WITH DIFFERENTIAL/PLATELET  HEPATIC FUNCTION PANEL  URINALYSIS, ROUTINE W REFLEX MICROSCOPIC  TROPONIN I (HIGH SENSITIVITY)  TROPONIN I (HIGH SENSITIVITY)    EKG EKG Interpretation  Date/Time:  Monday January 21 2019 12:25:49 EST Ventricular Rate:  71 PR Interval:    QRS Duration: 84 QT Interval:  380 QTC Calculation: 413 R Axis:   78 Text Interpretation: Sinus rhythm Borderline low voltage, extremity leads Probable anteroseptal infarct, old no significant change since 2017 Confirmed by Sherwood Gambler [phone]) on 01/21/2019 1:27:34 PM   Radiology DG Chest Port 1 View  Result Date: 01/21/2019 CLINICAL DATA:  Chest pain. EXAM: PORTABLE CHEST 1 VIEW COMPARISON:  March 29, 2016. FINDINGS: The heart size and mediastinal contours are within normal limits. Both lungs are clear. No pneumothorax or pleural effusion is noted. The visualized skeletal structures are unremarkable. IMPRESSION: No active disease. Electronically Signed   By: Marijo Conception M.D.   On: 01/21/2019 13:15    Procedures Procedures (including critical care time)  Medications Ordered in ED Medications  alum & mag hydroxide-simeth (MAALOX/MYLANTA) 200-200-20 MG/5ML suspension 30 mL (30 mLs Oral Refused 01/21/19 1709)    And  lidocaine (XYLOCAINE) 2 % viscous mouth solution 15 mL (15 mLs Oral Refused 01/21/19 1710)  nitroGLYCERIN (NITROSTAT) SL tablet 0.4 mg (0.4 mg Sublingual Given 01/21/19 1320)  morphine 4 MG/ML injection 4 mg (4 mg Intravenous Given 01/21/19 1326)    ED Course  I have reviewed the triage vital signs and the nursing notes.  Pertinent labs & imaging results that were available during my care of the patient were reviewed by me and considered in my medical decision making (see chart for details).  74 year old male presents the ED today complaining of substernal chest pain that woke him out of his  sleep at 5 AM today.  No other complaints at this time.  On  arrival to the ED patient is afebrile, nontachycardic and nontachypneic.  He has no other symptoms concerning for ACS today.  Patient is tender to the substernal area on palpation, no epigastric or right upper quadrant tenderness on exam.  Does have positive family history for CAD and has had about a 64-pack-year history despite his age of 53 he states he is started smoking around 74 years old.  Even this will work-up for ACS.  Will obtain D-dimer as well as patient states he feels short of breath.  She was given 2 sublingual nitroglycerin and 324 mg aspirin prior to arrival with EMS and states his pain is still present.  Will give third round of nitroglycerin and then proceed to morphine if necessary.   KG without ischemic changes.  Chest x-ray is clear.  Initial troponin of 4, will repeat.  D-dimer 0.65 however with age adjustment this is within normal limits.  No other significant abnormalities on lab work.   On reevaluation patient states that he is still having pain despite getting morphine and 3 rounds of nitroglycerin.  He has obvious tenderness palpation to the substernum distally.  No abdominal tenderness however will obtain hepatic function test to ensure that this is not gallbladder etiology.  Patient will be given GI cocktail and reevaluated.   Repeat troponin of 4.  No change.  LFTs within normal limits.  It does however appear that patient did not receive his GI cocktail.  Nursing staff informed me that when they went in there to give patient his GI cocktail he was getting dressed and stated that he was leaving.  She had offered him to speak with me regarding plan but he decided he was going to leave [place].  I was not informed of this until patient left the building.  I was unable to discuss risks versus benefits of leaving prior to that being done.  However with reassuring lab work and feel that patient was initially stable for discharge with close PCP follow-up.  I was waiting  on a urinalysis but was not able to obtain this.  This note was prepared using Dragon voice recognition software and may include unintentional dictation errors due to the inherent limitations of voice recognition software.   Clinical Course as of Jan 20 1714  Mon Jan 21, 2019  1454 With age adjustment within normal limits  D-Dimer, Quant(!): 0.65 [MV]  1512 Troponin I (High Sensitivity): 4 [MV]    Clinical Course User Index [MV] Eustaquio Maize, PA-C   MDM Rules/Calculators/A&P                       Final Clinical Impression(s) / ED Diagnoses Final diagnoses:  Nonspecific chest pain    Rx / DC Orders ED Discharge Orders    None       Eustaquio Maize, PA-C 01/21/19 1717    Sherwood Gambler, MD 01/22/19 [phone]

## 2019-01-21 NOTE — ED Triage Notes (Signed)
Pt woke up at 5 am with dull constant pain going across chest. Pt has had 324 mg asa and 2 nitro en route. Pain worse when laying down

## 2019-01-21 NOTE — ED Notes (Signed)
Pt aware of need for urine sample. Unable to give sample at this time.

## 2019-01-21 NOTE — ED Notes (Signed)
RN went into room to administer medications. Pt was standing up and had removed all of his cardiac leads and was getting himself dressed. Pt said that he was leaving. RN attempted to get pt to stay and talk with the EDP prior to leaving but he still refused. Pt was told he was being given medication to help his pain but he still once again refused. IV was removed and pt signed AMA paperwork. Pt was ambulatory upon discharge. EDP notified.

## 2019-03-08 ENCOUNTER — Other Ambulatory Visit: Payer: Self-pay | Admitting: Radiation Oncology

## 2019-03-08 ENCOUNTER — Ambulatory Visit
Admission: RE | Admit: 2019-03-08 | Discharge: 2019-03-08 | Disposition: A | Discharge status: Home / Self Care | Payer: Self-pay | Source: Ambulatory Visit | Attending: Radiation Oncology | Admitting: Radiation Oncology

## 2019-03-08 DIAGNOSIS — C3490 Malignant neoplasm of unspecified part of unspecified bronchus or lung: Secondary | ICD-10-CM

## 2019-03-11 NOTE — Progress Notes (Signed)
Thoracic Location of Tumor / Histology: Malignant neoplasm of left upper lobe lung  Patient presented with dyspnea and cough.  EBUS 2/3  PET 01/22/2019: Interval development of discrete and confluent lymph nodes/nodal masses in the pre-vascular and AP window region with associated prominent hypermetabolic activity as well as interval development of hypermetabolic left hilar lymphadenopathy.  Markedly hypermetabolic left suprahilar peribronchial soft tissue is also new.  These findings are consistent with malignancy in this patient with history of melanoma.  Prominent hypermetabolic nodular foci are seen in the left upper lobe centered on segmental and subsegmental branch pulmonary arterial structures.  CT CAP 06/08/2018: No definite evidence for metastatic disease within the chest, abdomen, or pelvis.  Biopsy of Left Hilar  03/05/2019 -Metastatic melanoma  Biopsies of Lymph node, Left Hilar 02/06/2019    Biopsies of Lymph nodes 02/06/2019   Tobacco/Marijuana/Snuff/ETOH use: Current smoker  Past/Anticipated interventions by pulmonary, if any: Dr. Deboraha Sprang 02/08/2019 -Notified him of results of the biopsies which were essentially all negative or non-diagnostic for malignancy; still awaiting flow cytometry results. -Unfortunately were not able to diagnose the main mass as it was too distal for the scope. -He already has a referral to medial oncology and I will go ahead and refer him to radiation oncology for an initial evaluation. -Again told him that he may require another biopsy for further tissue sampling but would wait and see what the other subspecialties recommended. 4/73/4037 -Hypermetabolic left hilar lung mass and lung nodule, concerning for malignancy, likely primary lung vs metastatic disease from prior malignant melanoma. -The left hilar lymph node may be reachable with EBUS but will be close; the actual AP window mass is not ammenable to bronchoscopic biopsy, nor is the left upper lobe  nodule.  The mass would require either surgical or CT guided transthoracic biopsy.  Past/Anticipated interventions by cardiothoracic surgery, if any:   Past/Anticipated interventions by medical oncology, if any:   Signs/Symptoms  Weight changes, if any: Lost about 10 pounds in roughly a year.  Respiratory complaints, if any: No  Hemoptysis, if any: No  Pain issues, if any:  No  SAFETY ISSUES:  Prior radiation? No  Pacemaker/ICD? No  Possible current pregnancy? n/a  Is the patient on methotrexate? No  Current Complaints / other details:   -Hx of malignant melanoma of his left arm s/p resection in 2019. -Hx left nephrectomy

## 2019-03-12 ENCOUNTER — Ambulatory Visit
Admission: RE | Admit: 2019-03-12 | Discharge: 2019-03-12 | Disposition: A | Discharge status: Home / Self Care | Payer: Medicare Other | Source: Ambulatory Visit | Attending: Radiation Oncology | Admitting: Radiation Oncology

## 2019-03-12 ENCOUNTER — Other Ambulatory Visit: Payer: Self-pay

## 2019-03-12 ENCOUNTER — Encounter: Payer: Self-pay | Admitting: *Deleted

## 2019-03-12 ENCOUNTER — Encounter: Payer: Self-pay | Admitting: Radiation Oncology

## 2019-03-12 VITALS — Ht 67.0 in | Wt 164.0 lb

## 2019-03-12 DIAGNOSIS — C781 Secondary malignant neoplasm of mediastinum: Secondary | ICD-10-CM | POA: Diagnosis not present

## 2019-03-12 DIAGNOSIS — Z85820 Personal history of malignant melanoma of skin: Secondary | ICD-10-CM | POA: Diagnosis not present

## 2019-03-12 DIAGNOSIS — C799 Secondary malignant neoplasm of unspecified site: Secondary | ICD-10-CM

## 2019-03-12 DIAGNOSIS — C3412 Malignant neoplasm of upper lobe, left bronchus or lung: Secondary | ICD-10-CM

## 2019-03-12 DIAGNOSIS — C7802 Secondary malignant neoplasm of left lung: Secondary | ICD-10-CM | POA: Diagnosis not present

## 2019-03-12 DIAGNOSIS — C439 Malignant melanoma of skin, unspecified: Secondary | ICD-10-CM

## 2019-03-12 DIAGNOSIS — C662 Malignant neoplasm of left ureter: Secondary | ICD-10-CM

## 2019-03-12 NOTE — Progress Notes (Signed)
Radiation Oncology         [phone] ________________________________  Initial Outpatient Consultation - Conducted via telephone due to current COVID-19 concerns for limiting patient exposure  I spoke with the patient to conduct this consult visit via telephone to spare the patient unnecessary potential exposure in the healthcare setting during the current COVID-19 pandemic. The patient was notified in advance and was offered a [place] meeting to allow for face to face communication but unfortunately reported that they did not have the appropriate resources/technology to support such a visit and instead preferred to proceed with a telephone consult.   Name: Dalton Miller        MRN: [id number]  Date of Service: 03/12/2019 DOB: 1945-06-11  HB:[id number], Theador Hawthorne, FNP  Alroy Dust, MD     REFERRING PHYSICIAN: Alroy Dust, MD   DIAGNOSIS: The encounter diagnosis was Malignant neoplasm of upper lobe of left lung ([place]).   HISTORY OF PRESENT ILLNESS: Dalton Miller is a 74 y.o. male seen at the request of Dr. Deboraha Sprang and Dr. Neita Carp for a malignant appearing process in the Left upper lobe. The patient has a history of melanoma, and of ureteral carcinoma. He is also a patient of the [facility]. CT imaging has been performed for surveillance, and he had been doing well and in surveillance. A PET CT on 01/22/19 revealed a 3 x 2.8 cm mass in the AP window nodal station with an SUV of 45, and markedly hypermetabolic left suprahilar and peribronchial soft tissue with an SUV max of 60, nodular foci in the left upper l mid obe on segment and subsegmental branch pulmonary arterial structures was seen with an SUV of 16.7, mild hypermetabolic change was noted within the small irregular nodular opacity in the anterior left apex.  No evidence of metastatic disease was noted.  He subsequently underwent navigational bronchoscopy with EBUS with Dr. Deboraha Sprang, this was performed on 02/06/2019, unfortunately  biopsies of the mass were nondiagnostic, his level 4R node, left hilar and right hilar nodes were negative for malignancy, but his level 7 node was concerning for atypia. He was counseled on the need for further tissue sampling with CT surgery and on March 05, 2019 he did undergo mediastinoscopy with Dr. Mee Hives at the [facility]. Frozen section of the left hilar mediastinal mass revealed a malignant neoplasm, a second biopsy for frozen also revealed malignancy of the left hilar mediastinal mass, final pathology revealed metastatic melanoma with necrosis of the left hilum in 3 biopsies, left third costal cartilage revealed findings that are further being explored and pathology following decalcification.  Left hilar mediastinal mass that was biopsied on fourth specimen also was consistent with metastatic melanoma. The patient is also being followed by Dr. Neita Carp in medical oncology and has follow up tomorrow in their clinic.  PREVIOUS RADIATION THERAPY: No   PAST MEDICAL HISTORY:  Past Medical History:  Diagnosis Date  . Acquired renal cyst of right kidney   . Cancer ([place])    renal cell   . Erectile dysfunction   . Heart murmur    CHILDHOOD  . Hematuria    left ureteral bleeding  . History of cellulitis    2012 left wrist  . History of kidney stones   . Hyperlipidemia   . Nephrolithiasis    bilateral non-obstructive per ct 07-02-2015  . Type 2 diabetes mellitus ([place])   . Wears glasses        PAST SURGICAL HISTORY: Past Surgical  History:  Procedure Laterality Date  . CARPAL TUNNEL RELEASE Right 1990  approx  . COLONOSCOPY  03/04/2010  . CYSTOSCOPY W/ RETROGRADES Left 10/28/2015   Procedure: CYSTOSCOPY WITH LEFT RETROGRADE PYELOGRAM;  Surgeon: Cleon Gustin, MD;  Location: [facility];  Service: Urology;  Laterality: Left;  . CYSTOSCOPY W/ URETERAL STENT PLACEMENT Left 10/28/2015   Procedure: CYSTOSCOPY WITH LEFT URETERAL STENT EXCHANGE;  Surgeon: Cleon Gustin, MD;  Location: [facility];   Service: Urology;  Laterality: Left;  . CYSTOSCOPY WITH RETROGRADE PYELOGRAM, URETEROSCOPY AND STENT PLACEMENT Bilateral 09/17/2015   Procedure: CYSTOSCOPY WITH BILATERAL RETROGRADE PYELOGRAM, LEFT URETEROSCOPY WITH URETERAL BIOPSY AND  STENT PLACEMENT;  Surgeon: Irine Seal, MD;  Location: [facility];  Service: Urology;  Laterality: Bilateral;  . HOLMIUM LASER APPLICATION Left 6/44/0347   Procedure: HOLMIUM LASER APPLICATION;  Surgeon: Irine Seal, MD;  Location[facility];  Service: Urology;  Laterality: Left;  . KNEE ARTHROSCOPY Right 2008  . MELANOMA EXCISION  2018   Left arm  . right total knee replacement  Right    2009  . ROBOT ASSITED LAPAROSCOPIC NEPHROURETERECTOMY Left 12/04/2015   Procedure: XI ROBOT ASSITED LAPAROSCOPIC NEPHROURETERECTOMY;  Surgeon: Cleon Gustin, MD;  Location: [facility];  Service: Urology;  Laterality: Left;  . STONE EXTRACTION WITH BASKET Left 09/17/2015   Procedure: STONE EXTRACTION WITH BASKET;  Surgeon: Irine Seal, MD;  Location: [facility];  Service: Urology;  Laterality: Left;  . TOTAL KNEE ARTHROPLASTY Right 2009  . URETEROSCOPY  10/28/2015   Procedure: DIAGNOSTIC LEFT URETEROSCOPY;  Surgeon: Cleon Gustin, MD;  Location: [facility];  Service: Urology;;     FAMILY HISTORY:  Family History  Problem Relation Age of Onset  . Diabetes Mother   . Heart disease Mother   . Stroke Mother   . CAD Mother   . Diabetes Father   . Congestive Heart Failure Father   . Cancer Brother   . Heart disease Son   . COPD Son   . Emphysema Son   . Heart attack Brother      SOCIAL HISTORY:  reports that he has been smoking cigars. He has a 90.00 pack-year smoking history. He quit smokeless tobacco use about 26 years ago.  His smokeless tobacco use included chew. He reports that he does not drink alcohol or use drugs.  The patient is widowed and lives in [place].   ALLERGIES: Patient has no known  allergies.   MEDICATIONS:  Current Outpatient Medications  Medication Sig Dispense Refill  . aspirin EC 81 MG tablet Take 1 tablet (81 mg total) by mouth daily. 90 tablet 1  . atorvastatin (LIPITOR) 10 MG tablet TAKE 1 TABLET BY MOUTH EVERY DAY IN THE MORNING 90 tablet 1  . fluticasone (FLONASE) 50 MCG/ACT nasal spray SPRAY 2 SPRAYS INTO EACH NOSTRIL EVERY DAY 48 mL 1  . glucose blood test strip Check BS daily and as needed 100 each 2  . lisinopril (ZESTRIL) 10 MG tablet TAKE 1 TABLET BY MOUTH EVERY DAY IN THE MORNING 90 tablet 1  . Vitamin D, Ergocalciferol, (DRISDOL) 50000 units CAPS capsule Take 1 capsule (50,000 Units total) by mouth every 7 (seven) days. (Patient not taking: Reported on 05/08/2018) 12 capsule 3   No current facility-administered medications for this encounter.     REVIEW OF SYSTEMS: On review of systems, the patient reports that he is doing well overall. He reports one episode of chest pain and shortness of  breath a few months ago and reports that his has not returned. He denies any cough or hemoptysis. He denies any chest pain, shortness of breath, fevers, chills, night sweats, unintended weight changes. He denies any bowel or bladder disturbances, and denies abdominal pain, nausea or vomiting. He denies any new skin lesions and denies any new musculoskeletal or joint aches or pains. A complete review of systems is obtained and is otherwise negative.     PHYSICAL EXAM:  Wt Readings from Last 3 Encounters:  01/21/19 163 lb (73.9 kg)  03/19/18 166 lb (75.3 kg)  11/16/17 169 lb 3.2 oz (76.7 kg)  Unable to assess due to encounter type.  ECOG = 0  0 - Asymptomatic (Fully active, able to carry on all predisease activities without restriction)  1 - Symptomatic but completely ambulatory (Restricted in physically strenuous activity but ambulatory and able to carry out work of a light or sedentary nature. For example, light housework, office work)  2 - Symptomatic, <50%  in bed during the day (Ambulatory and capable of all self care but unable to carry out any work activities. Up and about more than 50% of waking hours)  3 - Symptomatic, >50% in bed, but not bedbound (Capable of only limited self-care, confined to bed or chair 50% or more of waking hours)  4 - Bedbound (Completely disabled. Cannot carry on any self-care. Totally confined to bed or chair)  5 - Death   Eustace Pen MM, Creech RH, Tormey DC, et al. [phone]). "Toxicity and response criteria of the [facility]". [place] Oncol. 5 (6): 649-55    LABORATORY DATA:  Lab Results  Component Value Date   WBC 8.0 01/21/2019   HGB 16.0 01/21/2019   HCT 49.2 01/21/2019   MCV 99.2 01/21/2019   PLT 160 01/21/2019   Lab Results  Component Value Date   NA 135 01/21/2019   K 4.6 01/21/2019   CL 104 01/21/2019   CO2 24 01/21/2019   Lab Results  Component Value Date   ALT 12 01/21/2019   AST 15 01/21/2019   ALKPHOS 70 01/21/2019   BILITOT 0.7 01/21/2019      RADIOGRAPHY: No results found.     IMPRESSION/PLAN: 1. Recurrent metastatic melanoma originally of the skin of the elbow with lung and mediastinal disease. Dr. Lisbeth Renshaw discusses the pathology findings and reviews the nature of recurrent melanoma. He also reviews the patient's imaging and that he does not appear to have any disease compressing his airways. Dr. Lisbeth Renshaw suspects that medical oncology will plan to start systemic therapy, possibly immunotherapy. We discussed the scenarios for which patients may benefit from radiotherapy such as to palliate symptoms.  We discussed the risks, benefits, short, and long term effects of radiotherapy, and the patient is interested in options of palliative therapy if he develops progressive symptoms. For now we will be available to him as needed to consider therapy in the future if he became symptomatic. 2. Papillary Urothelial Carcinoma. The patient has been NED, and will be followed by  urology and medical oncology expectantly.   Given current concerns for patient exposure during the COVID-19 pandemic, this encounter was conducted via telephone.  The patient has provided two factor identification and has given verbal consent for this type of encounter and has been advised to only accept a meeting of this type in a secure network environment. The time spent during this encounter was 45 minutes including preparation, discussion, and coordination of the patient's care.  The attendants for this meeting include Blenda Nicely, RN, Dr. Lisbeth Renshaw, Hayden Pedro  and [name].  During the encounter,  Blenda Nicely, RN, Dr. Lisbeth Renshaw, and Hayden Pedro were located at [facility] Radiation Oncology Department.  Elmer Bales Killmer was located at home.   The above documentation reflects my direct findings during this shared patient visit. Please see the separate note by Dr. Lisbeth Renshaw on this date for the remainder of the patient's plan of care.    Carola Rhine, PAC

## 2019-03-21 ENCOUNTER — Ambulatory Visit: Payer: Medicare HMO | Admitting: Family

## 2019-03-22 ENCOUNTER — Encounter: Payer: Self-pay | Admitting: Family

## 2019-04-01 ENCOUNTER — Telehealth: Payer: Self-pay | Admitting: *Deleted

## 2019-04-01 NOTE — Telephone Encounter (Signed)
On 04/01/19 [facility]

## 2019-04-04 ENCOUNTER — Other Ambulatory Visit: Payer: Self-pay

## 2019-04-04 ENCOUNTER — Encounter (HOSPITAL_COMMUNITY): Payer: Self-pay

## 2019-04-04 ENCOUNTER — Emergency Department (HOSPITAL_COMMUNITY): Payer: No Typology Code available for payment source

## 2019-04-04 ENCOUNTER — Emergency Department (HOSPITAL_COMMUNITY)
Admission: EM | Admit: 2019-04-04 | Discharge: 2019-04-04 | Disposition: A | Discharge status: Home / Self Care | Payer: No Typology Code available for payment source | Attending: Emergency Medicine | Admitting: Emergency Medicine

## 2019-04-04 DIAGNOSIS — Y929 Unspecified place or not applicable: Secondary | ICD-10-CM | POA: Diagnosis not present

## 2019-04-04 DIAGNOSIS — J449 Chronic obstructive pulmonary disease, unspecified: Secondary | ICD-10-CM | POA: Diagnosis not present

## 2019-04-04 DIAGNOSIS — S3992XA Unspecified injury of lower back, initial encounter: Secondary | ICD-10-CM | POA: Diagnosis present

## 2019-04-04 DIAGNOSIS — Z96651 Presence of right artificial knee joint: Secondary | ICD-10-CM | POA: Diagnosis not present

## 2019-04-04 DIAGNOSIS — S39012A Strain of muscle, fascia and tendon of lower back, initial encounter: Secondary | ICD-10-CM | POA: Diagnosis not present

## 2019-04-04 DIAGNOSIS — Y999 Unspecified external cause status: Secondary | ICD-10-CM | POA: Diagnosis not present

## 2019-04-04 DIAGNOSIS — Y939 Activity, unspecified: Secondary | ICD-10-CM | POA: Insufficient documentation

## 2019-04-04 DIAGNOSIS — X58XXXA Exposure to other specified factors, initial encounter: Secondary | ICD-10-CM | POA: Diagnosis not present

## 2019-04-04 DIAGNOSIS — Z79899 Other long term (current) drug therapy: Secondary | ICD-10-CM | POA: Diagnosis not present

## 2019-04-04 DIAGNOSIS — E119 Type 2 diabetes mellitus without complications: Secondary | ICD-10-CM | POA: Insufficient documentation

## 2019-04-04 DIAGNOSIS — Z7982 Long term (current) use of aspirin: Secondary | ICD-10-CM | POA: Diagnosis not present

## 2019-04-04 DIAGNOSIS — F17290 Nicotine dependence, other tobacco product, uncomplicated: Secondary | ICD-10-CM | POA: Insufficient documentation

## 2019-04-04 HISTORY — DX: Secondary malignant neoplasm of left lung: C78.02

## 2019-04-04 LAB — COMPREHENSIVE METABOLIC PANEL
ALT: 71 U/L — ABNORMAL HIGH (ref 0–44)
AST: 33 U/L (ref 15–41)
Albumin: 3.6 g/dL (ref 3.5–5.0)
Alkaline Phosphatase: 371 U/L — ABNORMAL HIGH (ref 38–126)
Anion gap: 7 (ref 5–15)
BUN: 21 mg/dL (ref 8–23)
CO2: 27 mmol/L (ref 22–32)
Calcium: 8.9 mg/dL (ref 8.9–10.3)
Chloride: 101 mmol/L (ref 98–111)
Creatinine, Ser: 1.03 mg/dL (ref 0.61–1.24)
GFR calc Af Amer: >60 mL/min (ref >60)
GFR calc non Af Amer: >60 mL/min (ref >60)
Glucose, Bld: 262 mg/dL — ABNORMAL HIGH (ref 70–99)
Potassium: 4.2 mmol/L (ref 3.5–5.1)
Sodium: 135 mmol/L (ref 135–145)
Total Bilirubin: 0.8 mg/dL (ref 0.3–1.2)
Total Protein: 7.4 g/dL (ref 6.5–8.1)

## 2019-04-04 LAB — CBC WITH DIFFERENTIAL/PLATELET
Abs Immature Granulocytes: 0.02 10*3/uL (ref 0.00–0.07)
Basophils Absolute: 0.1 10*3/uL (ref 0.0–0.1)
Basophils Relative: 1 %
Eosinophils Absolute: 0.3 10*3/uL (ref 0.0–0.5)
Eosinophils Relative: 5 %
HCT: 42.5 % (ref 39.0–52.0)
Hemoglobin: 13.7 g/dL (ref 13.0–17.0)
Immature Granulocytes: 0 %
Lymphocytes Relative: 26 %
Lymphs Abs: 1.8 10*3/uL (ref 0.7–4.0)
MCH: 31.8 pg (ref 26.0–34.0)
MCHC: 32.2 g/dL (ref 30.0–36.0)
MCV: 98.6 fL (ref 80.0–100.0)
Monocytes Absolute: 0.8 10*3/uL (ref 0.1–1.0)
Monocytes Relative: 11 %
Neutro Abs: 3.9 10*3/uL (ref 1.7–7.7)
Neutrophils Relative %: 57 %
Platelets: 187 10*3/uL (ref 150–400)
RBC: 4.31 MIL/uL (ref 4.22–5.81)
RDW: 13.9 % (ref 11.5–15.5)
WBC: 6.9 10*3/uL (ref 4.0–10.5)
nRBC: 0 % (ref 0.0–0.2)

## 2019-04-04 LAB — URINALYSIS, ROUTINE W REFLEX MICROSCOPIC
Bilirubin Urine: NEGATIVE
Glucose, UA: NEGATIVE mg/dL
Hgb urine dipstick: NEGATIVE
Ketones, ur: NEGATIVE mg/dL
Leukocytes,Ua: NEGATIVE
Nitrite: NEGATIVE
Protein, ur: NEGATIVE mg/dL
Specific Gravity, Urine: 1.034 — ABNORMAL HIGH (ref 1.005–1.030)
pH: 6 (ref 5.0–8.0)

## 2019-04-04 MED ORDER — HYDROCODONE-ACETAMINOPHEN 5-325 MG PO TABS: 1.0000 | ORAL_TABLET | Freq: Four times a day (QID) | ORAL | 0 refills | Status: DC | PRN

## 2019-04-04 MED ORDER — IOHEXOL 300 MG/ML  SOLN
100.0000 mL | Freq: Once | INTRAMUSCULAR | Status: AC | PRN
  Administered 2019-04-04: 100 mL via INTRAVENOUS

## 2019-04-04 MED ORDER — ONDANSETRON HCL 4 MG/2ML IJ SOLN
4.0000 mg | Freq: Once | INTRAMUSCULAR | Status: AC
  Administered 2019-04-04: 4 mg via INTRAVENOUS
  Filled 2019-04-04: qty 2

## 2019-04-04 MED ORDER — HYDROMORPHONE HCL 1 MG/ML IJ SOLN
1.0000 mg | Freq: Once | INTRAMUSCULAR | Status: AC
  Administered 2019-04-04: 1 mg via INTRAVENOUS
  Filled 2019-04-04: qty 1

## 2019-04-04 NOTE — Discharge Instructions (Addendum)
Follow-up with your family doctor next week for recheck.  Take the hydrocodone if Tylenol or Motrin alone does not help with the pain

## 2019-04-04 NOTE — ED Triage Notes (Signed)
Pt presenting with back pain that goes across lumbar region. Pt states this began 2 weeks ago. Pt unable to sleep at night due to pain.   Pt currently receiving treatment for metastatic melanoma currently. Pt states he has mass on lungs and around heart.   Pt was at [facility] today for back pain who recommended pt be seen at ER for CT and evaluation.

## 2019-04-05 NOTE — ED Provider Notes (Signed)
[facility] Provider Note   CSN: [id number] Arrival date & time: 04/04/19  1413     History Chief Complaint  Patient presents with  . Back Pain    Dalton Miller is a 74 y.o. male.  Patient complains of low back pain.  The pain is worse with movement.  The history is provided by the patient. No language interpreter was used.  Back Pain Location:  Lumbar spine Quality:  Aching Radiates to:  Does not radiate Pain severity:  Moderate Pain is:  Unable to specify Timing:  Constant Progression:  Worsening Chronicity:  New Context: not emotional stress   Relieved by:  Nothing Exacerbated by: Movement. Ineffective treatments:  None tried Associated symptoms: no abdominal pain, no chest pain and no headaches        Past Medical History:  Diagnosis Date  . Acquired renal cyst of right kidney   . Cancer ([place])    renal cell   . Erectile dysfunction   . Heart murmur    CHILDHOOD  . Hematuria    left ureteral bleeding  . History of cellulitis    2012 left wrist  . History of kidney stones   . Hyperlipidemia   . Metastatic melanoma to lung, left ([place])   . Nephrolithiasis    bilateral non-obstructive per ct 07-02-2015  . Type 2 diabetes mellitus ([place])   . Wears glasses     Patient Active Problem List   Diagnosis Date Noted  . Malignant neoplasm of upper lobe of left lung ([place]) 03/12/2019  . Metastatic melanoma ([place]) 03/12/2019  . Bladder polyps 09/21/2018  . COPD (chronic obstructive pulmonary disease) ([place]) 03/19/2018  . Single kidney 03/22/2016  . Primary ureteral papillary carcinoma, left ([place]) 12/04/2015  . Overweight (BMI 25.0-29.9) 07/27/2015  . Vitamin D deficiency 02/16/2014  . Hypertension associated with diabetes ([place]) 02/14/2014  . Tobacco user   . Erectile dysfunction   . Diabetes mellitus ([place]) 03/22/2010  . Hyperlipidemia associated with type 2 diabetes mellitus ([place]) 03/22/2010    Past Surgical History:  Procedure Laterality  Date  . CARPAL TUNNEL RELEASE Right 1990  approx  . COLONOSCOPY  03/04/2010  . CYSTOSCOPY W/ RETROGRADES Left 10/28/2015   Procedure: CYSTOSCOPY WITH LEFT RETROGRADE PYELOGRAM;  Surgeon: Cleon Gustin, MD;  Location: [facility];  Service: Urology;  Laterality: Left;  . CYSTOSCOPY W/ URETERAL STENT PLACEMENT Left 10/28/2015   Procedure: CYSTOSCOPY WITH LEFT URETERAL STENT EXCHANGE;  Surgeon: Cleon Gustin, MD;  Location: [facility];  Service: Urology;  Laterality: Left;  . CYSTOSCOPY WITH RETROGRADE PYELOGRAM, URETEROSCOPY AND STENT PLACEMENT Bilateral 09/17/2015   Procedure: CYSTOSCOPY WITH BILATERAL RETROGRADE PYELOGRAM, LEFT URETEROSCOPY WITH URETERAL BIOPSY AND  STENT PLACEMENT;  Surgeon: Irine Seal, MD;  Location: [facility];  Service: Urology;  Laterality: Bilateral;  . HOLMIUM LASER APPLICATION Left 4/65/6812   Procedure: HOLMIUM LASER APPLICATION;  Surgeon: Irine Seal, MD;  Location: [facility];  Service: Urology;  Laterality: Left;  . KNEE ARTHROSCOPY Right 2008  . MELANOMA EXCISION  2018   Left arm  . right total knee replacement  Right    2009  . ROBOT ASSITED LAPAROSCOPIC NEPHROURETERECTOMY Left 12/04/2015   Procedure: XI ROBOT ASSITED LAPAROSCOPIC NEPHROURETERECTOMY;  Surgeon: Cleon Gustin, MD;  [facility];  Service: Urology;  Laterality: Left;  . STONE EXTRACTION WITH BASKET Left 09/17/2015   Procedure: STONE EXTRACTION WITH BASKET;  Surgeon: Irine Seal, MD;  Location: [facility];  Service: Urology;  Laterality: Left;  . TOTAL KNEE ARTHROPLASTY Right 2009  . URETEROSCOPY  10/28/2015   Procedure: DIAGNOSTIC LEFT URETEROSCOPY;  Surgeon: Cleon Gustin, MD;  Location[facility];  Service: Urology;;       Family History  Problem Relation Age of Onset  . Diabetes Mother   . Heart disease Mother   . Stroke Mother   . CAD Mother   . Diabetes Father   . Congestive Heart Failure Father   . Cancer Brother   . Heart  disease Son   . COPD Son   . Emphysema Son   . Heart attack Brother     Social History   Tobacco Use  . Smoking status: Current Every Day Smoker    Packs/day: 1.50    Years: 60.00    Pack years: 90.00    Types: Cigars  . Smokeless tobacco: Former Systems developer    Types: Chew    Quit date: 01/03/1993  . Tobacco comment: Quit smoking cigarettes over 10 years ago- smoke 1.5 pack per day 60 years  Substance Use Topics  . Alcohol use: No  . Drug use: No    Home Medications Prior to Admission medications   Medication Sig Start Date End Date Taking? Authorizing Provider  amLODipine (NORVASC) 2.5 MG tablet Take 2.5 mg by mouth daily.   Yes [provider]  aspirin EC 81 MG tablet Take 1 tablet (81 mg total) by mouth daily. 03/22/16  Yes Hawks, Christy A, FNP  fluticasone (FLONASE) 50 MCG/ACT nasal spray SPRAY 2 SPRAYS INTO EACH NOSTRIL EVERY DAY 09/14/18  Yes Hawks, Christy A, FNP  lisinopril (ZESTRIL) 5 MG tablet Take 5 mg by mouth daily.   Yes [provider]  HYDROcodone-acetaminophen (NORCO/VICODIN) 5-325 MG tablet Take 1 tablet by mouth every 6 (six) hours as needed. 04/04/19   Milton Ferguson, MD    Allergies    Patient has no known allergies.  Review of Systems   Review of Systems  Constitutional: Negative for appetite change and fatigue.  HENT: Negative for congestion, ear discharge and sinus pressure.   Eyes: Negative for discharge.  Respiratory: Negative for cough.   Cardiovascular: Negative for chest pain.  Gastrointestinal: Negative for abdominal pain and diarrhea.  Genitourinary: Negative for frequency and hematuria.  Musculoskeletal: Positive for back pain.  Skin: Negative for rash.  Neurological: Negative for seizures and headaches.  Psychiatric/Behavioral: Negative for hallucinations.    Physical Exam Updated Vital Signs BP (!) 149/85   Pulse 69   Temp 98 F (36.7 C) (Tympanic)   Resp 16   Ht 5\' 7"  (1.702 m)   Wt 73.9 kg   SpO2 96%   BMI 25.53  kg/m   Physical Exam Vitals and nursing note reviewed.  Constitutional:      Appearance: He is well-developed.  HENT:     Head: Normocephalic.     Nose: Nose normal.  Eyes:     General: No scleral icterus.    Conjunctiva/sclera: Conjunctivae normal.  Neck:     Thyroid: No thyromegaly.  Cardiovascular:     Rate and Rhythm: Normal rate and regular rhythm.     Heart sounds: No murmur. No friction rub. No gallop.   Pulmonary:     Breath sounds: No stridor. No wheezing or rales.  Chest:     Chest wall: No tenderness.  Abdominal:     General: There is no distension.     Tenderness: There is no abdominal tenderness. There is  no rebound.  Musculoskeletal:        General: Normal range of motion.     Cervical back: Neck supple.     Comments: Moderate tenderness to lumbar spine  Lymphadenopathy:     Cervical: No cervical adenopathy.  Skin:    Findings: No erythema or rash.  Neurological:     Mental Status: He is alert and oriented to person, place, and time.     Motor: No abnormal muscle tone.     Coordination: Coordination normal.  Psychiatric:        Behavior: Behavior normal.     ED Results / Procedures / Treatments   Labs (all labs ordered are listed, but only abnormal results are displayed) Labs Reviewed  COMPREHENSIVE METABOLIC PANEL - Abnormal; Notable for the following components:      Result Value   Glucose, Bld 262 (*)    ALT 71 (*)    Alkaline Phosphatase 371 (*)    All other components within normal limits  URINALYSIS, ROUTINE W REFLEX MICROSCOPIC - Abnormal; Notable for the following components:   Specific Gravity, Urine 1.034 (*)    All other components within normal limits  CBC WITH DIFFERENTIAL/PLATELET    EKG None  Radiology CT ABDOMEN PELVIS W CONTRAST  Result Date: 04/04/2019 CLINICAL DATA:  Acute abdominal pain EXAM: CT ABDOMEN AND PELVIS WITH CONTRAST TECHNIQUE: Multidetector CT imaging of the abdomen and pelvis was performed using the standard  protocol following bolus administration of intravenous contrast. CONTRAST:  127mL OMNIPAQUE IOHEXOL 300 MG/ML  SOLN COMPARISON:  CT abdomen pelvis dated 07/02/2015 FINDINGS: Lower chest: No acute abnormality. Hepatobiliary: No focal liver abnormality is seen. No gallstones, gallbladder wall thickening, or biliary dilatation. Pancreas: Unremarkable. No pancreatic ductal dilatation or surrounding inflammatory changes. Spleen: Normal in size without focal abnormality. Adrenals/Urinary Tract: A left adrenal gland adenoma is not significantly changed since 02/20/2015. The right adrenal gland appears normal. Other than renal cysts measuring up to 4.2 cm, the right kidney is normal, without renal calculi, focal lesion, or hydronephrosis. The patient is status post a left nephrectomy. Bladder is unremarkable. Stomach/Bowel: Stomach is within normal limits. Appendix appears normal. No evidence of bowel wall thickening, distention, or inflammatory changes. Vascular/Lymphatic: Aortic atherosclerosis. No enlarged abdominal or pelvic lymph nodes. Reproductive: Prostate is unremarkable. Other: No abdominal wall hernia or abnormality. No abdominopelvic ascites. Musculoskeletal: No acute or significant osseous findings. IMPRESSION: 1. No acute process in the abdomen or pelvis. Aortic Atherosclerosis (ICD10-I70.0). Electronically Signed   By: Zerita Boers M.D.   On: 04/04/2019 18:57    Procedures Procedures (including critical care time)  Medications Ordered in ED Medications  HYDROmorphone (DILAUDID) injection 1 mg (1 mg Intravenous Given 04/04/19 1605)  ondansetron (ZOFRAN) injection 4 mg (4 mg Intravenous Given 04/04/19 1605)  iohexol (OMNIPAQUE) 300 MG/ML solution 100 mL (100 mLs Intravenous Contrast Given 04/04/19 1823)    ED Course  I have reviewed the triage vital signs and the nursing notes.  Pertinent labs & imaging results that were available during my care of the patient were reviewed by me and considered in my  medical decision making (see chart for details).    MDM Rules/Calculators/A&P                      Labs unremarkable except for mildly elevated sugar.  Patient improved with pain medicine.  He will be given some pain medicine to take at home and will rest his back and follow-up with his  PCP Final Clinical Impression(s) / ED Diagnoses Final diagnoses:  Strain of lumbar region, initial encounter    Rx / DC Orders ED Discharge Orders         Ordered    HYDROcodone-acetaminophen (NORCO/VICODIN) 5-325 MG tablet  Every 6 hours PRN     04/04/19 2047           Milton Ferguson, MD 04/05/19 1042

## 2019-04-11 ENCOUNTER — Other Ambulatory Visit: Payer: Self-pay | Admitting: Family

## 2019-04-11 DIAGNOSIS — I10 Essential (primary) hypertension: Secondary | ICD-10-CM

## 2019-04-11 DIAGNOSIS — E782 Mixed hyperlipidemia: Secondary | ICD-10-CM

## 2019-05-02 ENCOUNTER — Other Ambulatory Visit: Payer: Self-pay | Admitting: Family

## 2019-05-02 DIAGNOSIS — E782 Mixed hyperlipidemia: Secondary | ICD-10-CM

## 2019-05-07 ENCOUNTER — Other Ambulatory Visit: Payer: Self-pay | Admitting: Family

## 2019-05-07 DIAGNOSIS — I10 Essential (primary) hypertension: Secondary | ICD-10-CM

## 2019-05-07 NOTE — Telephone Encounter (Signed)
Appt made/ pt aware

## 2019-05-07 NOTE — Telephone Encounter (Signed)
[facility]ks. NTBS 30 days given 04/11/19

## 2019-05-09 ENCOUNTER — Ambulatory Visit (INDEPENDENT_AMBULATORY_CARE_PROVIDER_SITE_OTHER): Payer: Medicare Other | Admitting: *Deleted

## 2019-05-09 DIAGNOSIS — Z0000 Encounter for general adult medical examination without abnormal findings: Secondary | ICD-10-CM

## 2019-05-09 NOTE — Progress Notes (Signed)
MEDICARE ANNUAL WELLNESS VISIT  05/09/2019  Telephone Visit Disclaimer This Medicare AWV was conducted by telephone due to national recommendations for restrictions regarding the COVID-19 Pandemic (e.g. social distancing).  I verified, using two identifiers, that I am speaking with Dalton Miller or their authorized healthcare agent. I discussed the limitations, risks, security, and privacy concerns of performing an evaluation and management service by telephone and the potential availability of an in-person appointment in the future. The patient expressed understanding and agreed to proceed.   Subjective:  Dalton Miller is a 74 y.o. male patient of Hawks, Theador Hawthorne, FNP who had a Medicare Annual Wellness Visit today via telephone. Dalton Miller is retired and he lives at home with his son residing with him. He has 3 children. He reports that he is socially active and does interact with friends/family regularly. He is not physically active and enjoys fishing.  Patient Care Team: Sharion Balloon, FNP as PCP - General (Nurse Practitioner) Cleon Gustin, MD as Consulting Physician (Urology)  Advanced Directives 05/09/2019 04/04/2019 03/12/2019 05/08/2018 04/12/2017 03/03/2016 12/14/2015  Does Patient Have a Medical Advance Directive? No No No No No No No  Would patient like information on creating a medical advance directive? No - Patient declined - No - Patient declined Yes (MAU/Ambulatory/Procedural Areas - Information given) No - Patient declined Yes (MAU/Ambulatory/Procedural Areas - Information given) -    Hospital Utilization Over the Past 12 Months: # of hospitalizations or ER visits: 1 # of surgeries: 1  Review of Systems    Patient reports that his overall health is unchanged compared to last year.  History obtained from the patient and patient chart.  Patient Reported Readings (BP, Pulse, CBG, Weight, etc) none  Pain Assessment Pain : No/denies pain     Current Medications &  Allergies (verified) Allergies as of 05/09/2019   No Known Allergies     Medication List       Accurate as of May 09, 2019 10:47 AM. If you have any questions, ask your nurse or doctor.        STOP taking these medications   HYDROcodone-acetaminophen 5-325 MG tablet Commonly known as: NORCO/VICODIN   lisinopril 10 MG tablet Commonly known as: ZESTRIL   lisinopril 5 MG tablet Commonly known as: ZESTRIL     TAKE these medications   amLODipine 2.5 MG tablet Commonly known as: NORVASC Take 2.5 mg by mouth daily.   aspirin EC 81 MG tablet Take 1 tablet (81 mg total) by mouth daily.   fluticasone 50 MCG/ACT nasal spray Commonly known as: FLONASE SPRAY 2 SPRAYS INTO EACH NOSTRIL EVERY DAY       History (reviewed): Past Medical History:  Diagnosis Date  . Acquired renal cyst of right kidney   . Cancer ([place])    renal cell   . Erectile dysfunction   . Heart murmur    CHILDHOOD  . Hematuria    left ureteral bleeding  . History of cellulitis    2012 left wrist  . History of kidney stones   . Hyperlipidemia   . Metastatic melanoma to lung, left ([place])   . Nephrolithiasis    bilateral non-obstructive per ct 07-02-2015  . Type 2 diabetes mellitus ([place])   . Wears glasses    Past Surgical History:  Procedure Laterality Date  . CARPAL TUNNEL RELEASE Right 1990  approx  . COLONOSCOPY  03/04/2010  . CYSTOSCOPY W/ RETROGRADES Left 10/28/2015   Procedure: CYSTOSCOPY WITH LEFT  RETROGRADE PYELOGRAM;  Surgeon: Cleon Gustin, MD;  Location: [facility];  Service: Urology;  Laterality: Left;  . CYSTOSCOPY W/ URETERAL STENT PLACEMENT Left 10/28/2015   Procedure: CYSTOSCOPY WITH LEFT URETERAL STENT EXCHANGE;  Surgeon: Cleon Gustin, MD;  Location: [facility];  Service: Urology;  Laterality: Left;  . CYSTOSCOPY WITH RETROGRADE PYELOGRAM, URETEROSCOPY AND STENT PLACEMENT Bilateral 09/17/2015   Procedure: CYSTOSCOPY WITH BILATERAL RETROGRADE PYELOGRAM, LEFT URETEROSCOPY WITH URETERAL  BIOPSY AND  STENT PLACEMENT;  Surgeon: Irine Seal, MD;  Location: [facility];  Service: Urology;  Laterality: Bilateral;  . HOLMIUM LASER APPLICATION Left 4/62/7035   Procedure: HOLMIUM LASER APPLICATION;  Surgeon: Irine Seal, MD;  Location: [facility];  Service: Urology;  Laterality: Left;  . KNEE ARTHROSCOPY Right 2008  . MELANOMA EXCISION  2018   Left arm  . right total knee replacement  Right    2009  . ROBOT ASSITED LAPAROSCOPIC NEPHROURETERECTOMY Left 12/04/2015   Procedure: XI ROBOT ASSITED LAPAROSCOPIC NEPHROURETERECTOMY;  Surgeon: Cleon Gustin, MD;  Location: [facility];  Service: Urology;  Laterality: Left;  . STONE EXTRACTION WITH BASKET Left 09/17/2015   Procedure: STONE EXTRACTION WITH BASKET;  Surgeon: Irine Seal, MD;  Location: [facility];  Service: Urology;  Laterality: Left;  . TOTAL KNEE ARTHROPLASTY Right 2009  . URETEROSCOPY  10/28/2015   Procedure: DIAGNOSTIC LEFT URETEROSCOPY;  Surgeon: Cleon Gustin, MD;  Location: [facility];  Service: Urology;;   Family History  Problem Relation Age of Onset  . Diabetes Mother   . Heart disease Mother   . Stroke Mother   . CAD Mother   . Diabetes Father   . Congestive Heart Failure Father   . Cancer Brother   . Heart disease Son   . COPD Son   . Emphysema Son   . Heart attack Brother    Social History   Socioeconomic History  . Marital status: Widowed    Spouse name: Not on file  . Number of children: 3  . Years of education: 8  . Highest education level: 8th grade  Occupational History  . 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 Smoking status: Current Every Day Smoker    Packs/day: 1.50    Years: 63.00    Pack years: 94.50    Types: Cigars  . Smokeless tobacco: Former Systems developer    Types: Chew    Quit date: 01/03/1993  . Tobacco comment: Quit smoking cigarettes over 10 years ago- smoke 1.5 pack per day 60 years  Substance  and Sexual Activity  . Alcohol use: No  . Drug use: No  . Sexual activity: Never  Other Topics Concern  . Not on file  Social History Narrative  . Not on file   Social Determinants of Health   Financial Resource Strain:   . Difficulty of Paying Living Expenses:   Food Insecurity:   . Worried About Charity fundraiser in the Last Year:   . Arboriculturist in the Last Year:   Transportation Needs:   . Film/video editor (Medical):   [name] Lack of Transportation (Non-Medical):   Physical Activity: Inactive  . Days of Exercise per Week: 0 days  . Minutes of Exercise per Session: 0 min  Stress:   . Feeling of Stress :   Social Connections: Unknown  . Frequency of Communication with Friends and Family: Not on file  .  Frequency of Social Gatherings with Friends and Family: More than three times a week  . Attends Religious Services: Not on file  . Active Member of Clubs or Organizations: Not on file  . Attends Archivist Meetings: Not on file  . Marital Status: Not on file    Activities of Daily Living In your present state of health, do you have any difficulty performing the following activities: 05/09/2019  Hearing? N  Vision? N  Difficulty concentrating or making decisions? N  Walking or climbing stairs? Y  Comment "i dont do alot of stairs"  Dressing or bathing? N  Doing errands, shopping? N  Preparing Food and eating ? N  Using the Toilet? N  In the past six months, have you accidently leaked urine? N  Do you have problems with loss of bowel control? N  Managing your Medications? N  Managing your Finances? N  Housekeeping or managing your Housekeeping? N  Some recent data might be hidden    Patient Education/ Literacy How often do you need to have someone help you when you read instructions, pamphlets, or other written materials from your doctor or pharmacy?: 1 - Never What is the last grade level you completed in school?: 8th  Exercise Current Exercise  Habits: The patient does not participate in regular exercise at present, Exercise limited by: orthopedic condition(s)(total knee replacement, right knee)  Diet Patient reports consuming 1 meals a day and 2 snack(s) a day Patient reports that his primary diet is: Regular Patient reports that she does have regular access to food.   Depression Screen PHQ 2/9 Scores 05/09/2019 05/08/2018 03/19/2018 11/16/2017 08/14/2017 04/14/2017 04/12/2017  PHQ - 2 Score 0 0 0 0 0 0 0     Fall Risk Fall Risk  05/09/2019 05/08/2018 03/19/2018 11/16/2017 08/14/2017  Falls in the past year? 1 0 0 0 No  Number falls in past yr: 0 - - - -  Injury with Fall? 0 - - - -  Risk for fall due to : History of fall(s) - - - -  Follow up Falls evaluation completed Falls prevention discussed;Education provided - - -     Objective:  Dalton Miller seemed alert and oriented and he participated appropriately during our telephone visit.  Blood Pressure Weight BMI  BP Readings from Last 3 Encounters:  04/04/19 (!) 149/85  01/21/19 (!) 160/80  03/19/18 126/73   Wt Readings from Last 3 Encounters:  04/04/19 163 lb (73.9 kg)  03/12/19 164 lb (74.4 kg)  01/21/19 163 lb (73.9 kg)   BMI Readings from Last 1 Encounters:  04/04/19 25.53 kg/m    *Unable to obtain current vital signs, weight, and BMI due to telephone visit type  Hearing/Vision  . Andruw did not seem to have difficulty with hearing/understanding during the telephone conversation . Reports that he has had a formal eye exam by an eye care professional within the past year . Reports that he has not had a formal hearing evaluation within the past year *Unable to fully assess hearing and vision during telephone visit type  Cognitive Function: 6CIT Screen 05/09/2019 05/08/2018  What Year? 0 points 0 points  What month? 0 points 0 points  What time? 0 points 0 points  Count back from 20 0 points 0 points  Months in reverse 0 points 0 points  Repeat phrase 0 points 4 points    Total Score 0 4   (Normal:0-7, Significant for Dysfunction: >8)  Normal Cognitive Function Screening:  Yes   Immunization & Health Maintenance Record Immunization History  Administered Date(s) Administered  . Influenza Whole 10/04/2007  . Pneumococcal Conjugate-13 05/26/2014  . Pneumococcal Polysaccharide-23 10/04/2007, 07/27/2015  . Tdap 01/03/2006    Health Maintenance  Topic Date Due  . FOOT EXAM  04/15/2018  . HEMOGLOBIN A1C  09/19/2018  . COVID-19 Vaccine (1) 08/09/2019 (Originally 01/10/1961)  . TETANUS/TDAP  11/17/2023 (Originally 01/04/2016)  . INFLUENZA VACCINE  08/04/2019  . OPHTHALMOLOGY EXAM  10/02/2019  . COLONOSCOPY  03/03/2020  . Hepatitis C Screening  Completed  . PNA vac Low Risk Adult  Completed       Assessment  This is a routine wellness examination for [employer].  Health Maintenance: Due or Overdue Health Maintenance Due  Topic Date Due  . FOOT EXAM  04/15/2018  . HEMOGLOBIN A1C  09/19/2018    [place] does not need a referral for Community Assistance: Care Management:   no Social Work:    no Prescription Assistance:  no Nutrition/Diabetes Education:  no   Plan:  Personalized Goals Goals Addressed            This Visit's Progress   . Patient Stated       05/09/2019 AWV Goal: Keep All Scheduled Appointments  Over the next year, patient will attend all scheduled appointments with their PCP and any specialists that they see.       Personalized Health Maintenance & Screening Recommendations    Lung Cancer Screening Recommended: yes (Low Dose CT Chest recommended if Age 47-80 years, 30 pack-year currently smoking OR have quit w/in past 15 years) Hepatitis C Screening recommended: no HIV Screening recommended: no  Advanced Directives: Written information was not prepared per patient's request.  Referrals & Orders No orders of the defined types were placed in this encounter.   Follow-up Plan . Follow-up with Sharion Balloon, FNP as planned   I have personally reviewed and noted the following in the patient's chart:   . Medical and social history . Use of alcohol, tobacco or illicit drugs  . Current medications and supplements . Functional ability and status . Nutritional status . Physical activity . Advanced directives . List of other physicians . Hospitalizations, surgeries, and ER visits in previous 12 months . Vitals . Screenings to include cognitive, depression, and falls . Referrals and appointments  In addition, I have reviewed and discussed with Dalton Bales Awbrey certain preventive protocols, quality metrics, and best practice recommendations. A written personalized care plan for preventive services as well as general preventive health recommendations is available and can be mailed to the patient at his request.     Baldomero Lamy, LPN 03/07/4560

## 2019-05-20 ENCOUNTER — Ambulatory Visit (INDEPENDENT_AMBULATORY_CARE_PROVIDER_SITE_OTHER): Payer: Medicare Other | Admitting: Family

## 2019-05-20 ENCOUNTER — Encounter: Payer: Self-pay | Admitting: Family

## 2019-05-20 ENCOUNTER — Other Ambulatory Visit: Payer: Self-pay

## 2019-05-20 VITALS — BP 107/63 | HR 69 | Temp 97.2°F | Ht 67.0 in | Wt 160.2 lb

## 2019-05-20 DIAGNOSIS — E785 Hyperlipidemia, unspecified: Secondary | ICD-10-CM

## 2019-05-20 DIAGNOSIS — I152 Hypertension secondary to endocrine disorders: Secondary | ICD-10-CM

## 2019-05-20 DIAGNOSIS — E663 Overweight: Secondary | ICD-10-CM

## 2019-05-20 DIAGNOSIS — E1165 Type 2 diabetes mellitus with hyperglycemia: Secondary | ICD-10-CM

## 2019-05-20 DIAGNOSIS — E1159 Type 2 diabetes mellitus with other circulatory complications: Secondary | ICD-10-CM | POA: Diagnosis not present

## 2019-05-20 DIAGNOSIS — E1169 Type 2 diabetes mellitus with other specified complication: Secondary | ICD-10-CM

## 2019-05-20 DIAGNOSIS — Z720 Tobacco use: Secondary | ICD-10-CM

## 2019-05-20 DIAGNOSIS — C439 Malignant melanoma of skin, unspecified: Secondary | ICD-10-CM

## 2019-05-20 DIAGNOSIS — I10 Essential (primary) hypertension: Secondary | ICD-10-CM

## 2019-05-20 DIAGNOSIS — J449 Chronic obstructive pulmonary disease, unspecified: Secondary | ICD-10-CM

## 2019-05-20 DIAGNOSIS — C799 Secondary malignant neoplasm of unspecified site: Secondary | ICD-10-CM

## 2019-05-20 DIAGNOSIS — C3412 Malignant neoplasm of upper lobe, left bronchus or lung: Secondary | ICD-10-CM | POA: Diagnosis not present

## 2019-05-20 LAB — BAYER DCA HB A1C WAIVED: HB A1C (BAYER DCA - WAIVED): 7 % — ABNORMAL HIGH (ref <7.0)

## 2019-05-20 MED ORDER — ATORVASTATIN CALCIUM 10 MG PO TABS: 10.0000 mg | ORAL_TABLET | Freq: Every day | ORAL | 2 refills | Status: DC

## 2019-05-20 MED ORDER — FLUTICASONE PROPIONATE 50 MCG/ACT NA SUSP: NASAL | 1 refills | Status: DC

## 2019-05-20 NOTE — Patient Instructions (Signed)

## 2019-05-20 NOTE — Progress Notes (Signed)
Subjective:    Patient ID: Dalton Miller, male    DOB: [birth year], 74 y.o.   MRN: [id number]  Chief Complaint  Patient presents with  . Medical Management of Chronic Issues  . Hypertension   Pt presents to the office today for chronic follow up. He is followed by Oncologists every 3 weeks at the [facility] for melanoma with mets to lung.  Hypertension This is a chronic problem. The current episode started more than 1 year ago. The problem has been resolved since onset. The problem is controlled. Associated symptoms include malaise/fatigue and shortness of breath. Pertinent negatives include no blurred vision or peripheral edema. Risk factors for coronary artery disease include dyslipidemia, diabetes mellitus, obesity, male gender, sedentary lifestyle and smoking/tobacco exposure. Past treatments include calcium channel blockers. The current treatment provides moderate improvement. There is no history of CVA.  Diabetes He presents for his follow-up diabetic visit. He has type 2 diabetes mellitus. His disease course has been stable. There are no hypoglycemic associated symptoms. Pertinent negatives for diabetes include no blurred vision and no foot paresthesias. Symptoms are stable. Pertinent negatives for diabetic complications include no CVA or heart disease. Risk factors for coronary artery disease include dyslipidemia, diabetes mellitus, male sex, hypertension and sedentary lifestyle. He is following a generally healthy diet. His overall blood glucose range is 110-130 mg/dl.  Hyperlipidemia This is a chronic problem. The current episode started more than 1 year ago. The problem is controlled. Recent lipid tests were reviewed and are normal. Associated symptoms include shortness of breath. Current antihyperlipidemic treatment includes statins. The current treatment provides moderate improvement of lipids.  Nicotine Dependence Presents for follow-up visit. His urge triggers include company of smokers. He  smokes 1 pack of cigarettes per day.  COPD  Pt continues to smoke 1 1/2 packs a day. Has melanoma with mets to lung, but denies any SOB.     Review of Systems  Constitutional: Positive for malaise/fatigue.  Eyes: Negative for blurred vision.  Respiratory: Positive for shortness of breath.   All other systems reviewed and are negative.      Objective:   Physical Exam Vitals reviewed.  Constitutional:      General: He is not in acute distress.    Appearance: He is well-developed.  HENT:     Head: Normocephalic.     Right Ear: Tympanic membrane normal.     Left Ear: Tympanic membrane normal.  Eyes:     General:        Right eye: No discharge.        Left eye: No discharge.     Pupils: Pupils are equal, round, and reactive to light.  Neck:     Thyroid: No thyromegaly.  Cardiovascular:     Rate and Rhythm: Normal rate and regular rhythm.     Heart sounds: Normal heart sounds. No murmur.  Pulmonary:     Effort: Pulmonary effort is normal. No respiratory distress.     Breath sounds: Normal breath sounds. No wheezing.  Abdominal:     General: Bowel sounds are normal. There is no distension.     Palpations: Abdomen is soft.     Tenderness: There is no abdominal tenderness.  Musculoskeletal:        General: No tenderness. Normal range of motion.     Cervical back: Normal range of motion and neck supple.  Skin:    General: Skin is warm and dry.     Findings: No erythema or rash.  Neurological:     Mental Status: He is alert and oriented to person, place, and time.     Cranial Nerves: No cranial nerve deficit.     Deep Tendon Reflexes: Reflexes are normal and symmetric.  Psychiatric:        Behavior: Behavior normal.        Thought Content: Thought content normal.        Judgment: Judgment normal.     Diabetic Foot Exam - Simple   Simple Foot Form Diabetic Foot exam was performed with the following findings: Yes 05/20/2019 12:20 PM  Visual Inspection No deformities,  no ulcerations, no other skin breakdown bilaterally: Yes Sensation Testing Intact to touch and monofilament testing bilaterally: Yes Pulse Check Posterior Tibialis and Dorsalis pulse intact bilaterally: Yes Comments      BP 107/63   Pulse 69   Temp (!) 97.2 F (36.2 C) (Temporal)   Ht '5\' 7"'  (1.702 m)   Wt 160 lb 3.2 oz (72.7 kg)   SpO2 98%   BMI 25.09 kg/m      Assessment & Plan:  Dalton Miller comes in today with chief complaint of Medical Management of Chronic Issues and Hypertension   Diagnosis and orders addressed:  1. Chronic obstructive pulmonary disease, unspecified COPD type (HCC) - fluticasone (FLONASE) 50 MCG/ACT nasal spray; SPRAY 2 SPRAYS INTO EACH NOSTRIL EVERY DAY  Dispense: 48 mL; Refill: 1 - CMP14+EGFR - CBC with Differential/Platelet  2. Hypertension associated with diabetes ([place]) Resolved  Will stop Norvasc 2.5 mg today - CMP14+EGFR - CBC with Differential/Platelet  3. Malignant neoplasm of upper lobe of left lung (HCC) - CMP14+EGFR - CBC with Differential/Platelet  4. Type 2 diabetes mellitus with hyperglycemia, without long-term current use of insulin (HCC) - Microalbumin / creatinine urine ratio - Bayer DCA Hb A1c Waived - CMP14+EGFR - CBC with Differential/Platelet  5. Hyperlipidemia associated with type 2 diabetes mellitus (HCC) - CMP14+EGFR - CBC with Differential/Platelet - Lipid panel  6. Metastatic melanoma ([place]) - CMP14+EGFR - CBC with Differential/Platelet  7. Overweight (BMI 25.0-29.9) - CMP14+EGFR - CBC with Differential/Platelet  8. Tobacco user -Smoking cessation discussed - CMP14+EGFR - CBC with Differential/Platelet   Labs pending Health Maintenance reviewed Diet and exercise encouraged  Follow up plan: 6 months and keep follow up with Oncologists    Evelina Dun, FNP

## 2019-05-21 LAB — LIPID PANEL
Chol/HDL Ratio: 3.4 ratio (ref 0.0–5.0)
Cholesterol, Total: 124 mg/dL (ref 100–199)
HDL: 37 mg/dL — ABNORMAL LOW (ref >39)
LDL Chol Calc (NIH): 71 mg/dL (ref 0–99)
Triglycerides: 78 mg/dL (ref 0–149)
VLDL Cholesterol Cal: 16 mg/dL (ref 5–40)

## 2019-05-21 LAB — CMP14+EGFR
ALT: 11 IU/L (ref 0–44)
AST: 18 IU/L (ref 0–40)
Albumin/Globulin Ratio: 1.2 (ref 1.2–2.2)
Albumin: 3.8 g/dL (ref 3.7–4.7)
Alkaline Phosphatase: 103 IU/L (ref 48–121)
BUN/Creatinine Ratio: 15 (ref 10–24)
BUN: 16 mg/dL (ref 8–27)
Bilirubin Total: 0.4 mg/dL (ref 0.0–1.2)
CO2: 20 mmol/L (ref 20–29)
Calcium: 9.4 mg/dL (ref 8.6–10.2)
Chloride: 97 mmol/L (ref 96–106)
Creatinine, Ser: 1.05 mg/dL (ref 0.76–1.27)
GFR calc Af Amer: 81 mL/min/{1.73_m2} (ref >59)
GFR calc non Af Amer: 70 mL/min/{1.73_m2} (ref >59)
Globulin, Total: 3.1 g/dL (ref 1.5–4.5)
Glucose: 122 mg/dL — ABNORMAL HIGH (ref 65–99)
Potassium: 4.8 mmol/L (ref 3.5–5.2)
Sodium: 136 mmol/L (ref 134–144)
Total Protein: 6.9 g/dL (ref 6.0–8.5)

## 2019-05-21 LAB — CBC WITH DIFFERENTIAL/PLATELET
Basophils Absolute: 0.1 10*3/uL (ref 0.0–0.2)
Basos: 1 %
EOS (ABSOLUTE): 0.1 10*3/uL (ref 0.0–0.4)
Eos: 2 %
Hematocrit: 43.4 % (ref 37.5–51.0)
Hemoglobin: 14.9 g/dL (ref 13.0–17.7)
Immature Grans (Abs): 0 10*3/uL (ref 0.0–0.1)
Immature Granulocytes: 0 %
Lymphocytes Absolute: 2 10*3/uL (ref 0.7–3.1)
Lymphs: 26 %
MCH: 32.4 pg (ref 26.6–33.0)
MCHC: 34.3 g/dL (ref 31.5–35.7)
MCV: 94 fL (ref 79–97)
Monocytes Absolute: 0.8 10*3/uL (ref 0.1–0.9)
Monocytes: 10 %
Neutrophils Absolute: 4.7 10*3/uL (ref 1.4–7.0)
Neutrophils: 61 %
Platelets: 207 10*3/uL (ref 150–450)
RBC: 4.6 x10E6/uL (ref 4.14–5.80)
RDW: 12.7 % (ref 11.6–15.4)
WBC: 7.6 10*3/uL (ref 3.4–10.8)

## 2019-05-21 LAB — MICROALBUMIN / CREATININE URINE RATIO
Creatinine, Urine: 53.3 mg/dL
Microalb/Creat Ratio: 20 mg/g creat (ref 0–29)
Microalbumin, Urine: 10.9 ug/mL

## 2019-05-23 ENCOUNTER — Other Ambulatory Visit: Payer: Self-pay | Admitting: Family

## 2019-05-23 DIAGNOSIS — I10 Essential (primary) hypertension: Secondary | ICD-10-CM

## 2019-06-08 ENCOUNTER — Other Ambulatory Visit: Payer: Self-pay | Admitting: Family

## 2019-06-08 DIAGNOSIS — I10 Essential (primary) hypertension: Secondary | ICD-10-CM

## 2019-10-09 DIAGNOSIS — R5383 Other fatigue: Secondary | ICD-10-CM | POA: Diagnosis not present

## 2019-10-09 DIAGNOSIS — R7989 Other specified abnormal findings of blood chemistry: Secondary | ICD-10-CM | POA: Diagnosis not present

## 2019-10-09 DIAGNOSIS — D696 Thrombocytopenia, unspecified: Secondary | ICD-10-CM | POA: Diagnosis not present

## 2020-05-11 ENCOUNTER — Ambulatory Visit (INDEPENDENT_AMBULATORY_CARE_PROVIDER_SITE_OTHER): Payer: Medicare HMO

## 2020-05-11 VITALS — Ht 67.0 in | Wt 160.0 lb

## 2020-05-11 DIAGNOSIS — M722 Plantar fascial fibromatosis: Secondary | ICD-10-CM | POA: Insufficient documentation

## 2020-05-11 DIAGNOSIS — R0602 Shortness of breath: Secondary | ICD-10-CM | POA: Insufficient documentation

## 2020-05-11 DIAGNOSIS — F411 Generalized anxiety disorder: Secondary | ICD-10-CM | POA: Insufficient documentation

## 2020-05-11 DIAGNOSIS — D696 Thrombocytopenia, unspecified: Secondary | ICD-10-CM | POA: Insufficient documentation

## 2020-05-11 DIAGNOSIS — C3492 Malignant neoplasm of unspecified part of left bronchus or lung: Secondary | ICD-10-CM | POA: Insufficient documentation

## 2020-05-11 DIAGNOSIS — Z122 Encounter for screening for malignant neoplasm of respiratory organs: Secondary | ICD-10-CM | POA: Insufficient documentation

## 2020-05-11 DIAGNOSIS — N329 Bladder disorder, unspecified: Secondary | ICD-10-CM | POA: Insufficient documentation

## 2020-05-11 DIAGNOSIS — M25519 Pain in unspecified shoulder: Secondary | ICD-10-CM | POA: Insufficient documentation

## 2020-05-11 DIAGNOSIS — Z0000 Encounter for general adult medical examination without abnormal findings: Secondary | ICD-10-CM

## 2020-05-11 DIAGNOSIS — R918 Other nonspecific abnormal finding of lung field: Secondary | ICD-10-CM | POA: Insufficient documentation

## 2020-05-11 DIAGNOSIS — M1990 Unspecified osteoarthritis, unspecified site: Secondary | ICD-10-CM | POA: Insufficient documentation

## 2020-05-11 DIAGNOSIS — E785 Hyperlipidemia, unspecified: Secondary | ICD-10-CM | POA: Insufficient documentation

## 2020-05-11 NOTE — Progress Notes (Signed)
Subjective:   Dalton Miller is a 75 y.o. male who presents for Medicare Annual/Subsequent preventive examination.  Virtual Visit via Telephone Note  I connected with  Dalton Miller on 05/11/20 at 10:30 AM EDT by telephone and verified that I am speaking with the correct person using two identifiers.  Location: Patient: Home Provider: [facility] Persons participating in the virtual visit: patient/Nurse Health Advisor   I discussed the limitations, risks, security and privacy concerns of performing an evaluation and management service by telephone and the availability of in person appointments. The patient expressed understanding and agreed to proceed.  Interactive audio and video telecommunications were attempted between this nurse and patient, however failed, due to patient having technical difficulties OR patient did not have access to video capability.  We continued and completed visit with audio only.  Some vital signs may be absent or patient reported.   Estes Lehner E Chau Savell, LPN   Review of Systems     Cardiac Risk Factors include: advanced age (>50men, >1 women);male gender;smoking/ tobacco exposure;sedentary lifestyle;dyslipidemia;hypertension     Objective:    Today's Vitals   05/11/20 1033 05/11/20 1034  Weight: 160 lb (72.6 kg)   Height: 5\' 7"  (1.702 m)   PainSc:  10-Worst pain ever   Body mass index is 25.06 kg/m.  Advanced Directives 05/11/2020 05/09/2019 04/04/2019 03/12/2019 05/08/2018 04/12/2017 03/03/2016  Does Patient Have a Medical Advance Directive? Yes No No No No No No  Type of Paramedic of [place];Living will - - - - - -  Copy of [place] in Chart? No - copy requested - - - - - -  Would patient like information on creating a medical advance directive? - No - Patient declined - No - Patient declined Yes (MAU/Ambulatory/Procedural Areas - Information given) No - Patient declined Yes (MAU/Ambulatory/Procedural Areas - Information  given)    Current Medications (verified) Outpatient Encounter Medications as of 05/11/2020  Medication Sig  . amLODipine (NORVASC) 2.5 MG tablet Take 1 tablet by mouth daily.  [name] aspirin EC 81 MG tablet Take 1 tablet (81 mg total) by mouth daily.  [name] atorvastatin (LIPITOR) 10 MG tablet Take 1 tablet (10 mg total) by mouth daily.  . carboxymethylcellulose (REFRESH PLUS) 0.5 % SOLN INSTILL 1 DROP IN BOTH EYES FOUR TIMES A DAY  . eltrombopag (PROMACTA) 25 MG tablet TAKE ONE TABLET BY MOUTH ONCE DAILY (TAKE 1 HOUR BEFORE OR 2 HOURS AFTER A MEAL)  . fluticasone (FLONASE) 50 MCG/ACT nasal spray SPRAY 2 SPRAYS INTO EACH NOSTRIL EVERY DAY  . lisinopril (ZESTRIL) 10 MG tablet TAKE 1 TABLET (10 MG TOTAL) BY MOUTH DAILY. NEEDS TO BE SEEN BEFORE NEXT REFILL.  [name] pregabalin (LYRICA) 75 MG capsule Take 1 capsule by mouth 2 (two) times daily.  . traMADol (ULTRAM) 50 MG tablet TAKE ONE-HALF TABLET BY MOUTH EVERY 6 HOURS AS NEEDED FOR PAIN   No facility-administered encounter medications on file as of 05/11/2020.    Allergies (verified) Patient has no known allergies.   History: Past Medical History:  Diagnosis Date  . Acquired renal cyst of right kidney   . Cancer ([place])    renal cell   . Erectile dysfunction   . Heart murmur    CHILDHOOD  . Hematuria    left ureteral bleeding  . History of cellulitis    2012 left wrist  . History of kidney stones   . Hyperlipidemia   . Metastatic melanoma to lung, left ([place])   .  Nephrolithiasis    bilateral non-obstructive per ct 07-02-2015  . Type 2 diabetes mellitus ([place])   . Wears glasses    Past Surgical History:  Procedure Laterality Date  . CARPAL TUNNEL RELEASE Right 1990  approx  . COLONOSCOPY  03/04/2010  . CYSTOSCOPY W/ RETROGRADES Left 10/28/2015   Procedure: CYSTOSCOPY WITH LEFT RETROGRADE PYELOGRAM;  Surgeon: Cleon Gustin, MD;  Location: [facility];  Service: Urology;  Laterality: Left;  . CYSTOSCOPY W/ URETERAL STENT PLACEMENT Left  10/28/2015   Procedure: CYSTOSCOPY WITH LEFT URETERAL STENT EXCHANGE;  Surgeon: Cleon Gustin, MD;  Location: [facility];  Service: Urology;  Laterality: Left;  . CYSTOSCOPY WITH RETROGRADE PYELOGRAM, URETEROSCOPY AND STENT PLACEMENT Bilateral 09/17/2015   Procedure: CYSTOSCOPY WITH BILATERAL RETROGRADE PYELOGRAM, LEFT URETEROSCOPY WITH URETERAL BIOPSY AND  STENT PLACEMENT;  Surgeon: Irine Seal, MD;  Location: [facility];  Service: Urology;  Laterality: Bilateral;  . HOLMIUM LASER APPLICATION Left 1/61/0960   Procedure: HOLMIUM LASER APPLICATION;  Surgeon: Irine Seal, MD;  Location: [facility];  Service: Urology;  Laterality: Left;  . KNEE ARTHROSCOPY Right 2008  . MELANOMA EXCISION  2018   Left arm  . right total knee replacement  Right    2009  . ROBOT ASSITED LAPAROSCOPIC NEPHROURETERECTOMY Left 12/04/2015   Procedure: XI ROBOT ASSITED LAPAROSCOPIC NEPHROURETERECTOMY;  Surgeon: Cleon Gustin, MD;  Location: [facility];  Service: Urology;  Laterality: Left;  . STONE EXTRACTION WITH BASKET Left 09/17/2015   Procedure: STONE EXTRACTION WITH BASKET;  Surgeon: Irine Seal, MD;  Location: [facility];  Service: Urology;  Laterality: Left;  . TOTAL KNEE ARTHROPLASTY Right 2009  . URETEROSCOPY  10/28/2015   Procedure: DIAGNOSTIC LEFT URETEROSCOPY;  Surgeon: Cleon Gustin, MD;  Location: [facility];  Service: Urology;;   Family History  Problem Relation Age of Onset  . Diabetes Mother   . Heart disease Mother   . Stroke Mother   . CAD Mother   . Diabetes Father   . Congestive Heart Failure Father   . Cancer Brother   . Heart disease Son   . COPD Son   . Emphysema Son   . Heart attack Brother    Social History   Socioeconomic History  . Marital status: Widowed    Spouse name: Not on file  . Number of children: 3  . Years of education: 8  . Highest education level: 8th grade  Occupational History  . Occupation: [employer] in [place]    Comment: 33 years  Tobacco Use  . Smoking status: Current Every Day Smoker    Packs/day: 1.50    Years: 63.00    Pack years: 94.50    Types: Cigars  . Smokeless tobacco: Former Systems developer    Types: Chew    Quit date: 01/03/1993  . Tobacco comment: Quit smoking cigarettes over 10 years ago- smoke 1.5 pack per day 60 years  Vaping Use  . Vaping Use: Never used  Substance and Sexual Activity  . Alcohol use: No  . Drug use: No  . Sexual activity: Never  Other Topics Concern  . Not on file  Social History Narrative  . Not on file   Social Determinants of Health   Financial Resource Strain: Low Risk   . Difficulty of Paying Living Expenses: Not hard at all  Food Insecurity: No Food Insecurity  . Worried About Charity fundraiser in the Last Year: Never true  . Ran  Out of Food in the Last Year: Never true  Transportation Needs: No Transportation Needs  . Lack of Transportation (Medical): No  . Lack of Transportation (Non-Medical): No  Physical Activity: Inactive  . Days of Exercise per Week: 0 days  . Minutes of Exercise per Session: 0 min  Stress: No Stress Concern Present  . Feeling of Stress : Not at all  Social Connections: Socially Isolated  . Frequency of Communication with Friends and Family: More than three times a week  . Frequency of Social Gatherings with Friends and Family: More than three times a week  . Attends Religious Services: Never  . Active Member of Clubs or Organizations: No  . Attends Archivist Meetings: Never  . Marital Status: Widowed    Tobacco Counseling Ready to quit: Not Answered Counseling given: Not Answered Comment: Quit smoking cigarettes over 10 years ago- smoke 1.5 pack per day 60 years   Clinical Intake:  Pre-visit preparation completed: Yes  Pain : 0-10 Pain Score: 10-Worst pain ever Pain Type: Chronic pain Pain Location: Shoulder Pain Orientation: Right,Left Pain Descriptors / Indicators: Aching,Sore Pain  Onset: More than a month ago Pain Frequency: Constant     BMI - recorded: 25.06 Nutritional Status: BMI 25 -29 Overweight Nutritional Risks: None Diabetes: No  How often do you need to have someone help you when you read instructions, pamphlets, or other written materials from your doctor or pharmacy?: 1 - Never  Diabetic? no  Interpreter Needed?: No  Information entered by :: Ascher Schroepfer, LPN   Activities of Daily Living In your present state of health, do you have any difficulty performing the following activities: 05/11/2020  Hearing? N  Vision? N  Difficulty concentrating or making decisions? N  Walking or climbing stairs? Y  Dressing or bathing? N  Doing errands, shopping? N  Preparing Food and eating ? N  Using the Toilet? N  In the past six months, have you accidently leaked urine? N  Do you have problems with loss of bowel control? N  Managing your Medications? N  Managing your Finances? N  Housekeeping or managing your Housekeeping? N  Some recent data might be hidden    Patient Care Team: Sharion Balloon, FNP as PCP - General (Nurse Practitioner) Alyson Ingles Candee Furbish, MD as Consulting Physician (Urology)  Indicate any recent Medical Services you may have received from other than [facility] providers in the past year (date may be approximate).     Assessment:   This is a routine wellness examination for Dodd.  Hearing/Vision screen  Hearing Screening   125Hz  250Hz  500Hz  1000Hz  2000Hz  3000Hz  4000Hz  6000Hz  8000Hz   Right ear:           Left ear:           Comments: Denies hearing difficulties  Vision Screening Comments: Wears eyeglasses - Annual visits with [facility] in [place] and/or [place] - up to date with eye exam  Dietary issues and exercise activities discussed: Current Exercise Habits: The patient does not participate in regular exercise at present, Exercise limited by: orthopedic condition(s)  Goals Addressed            This Visit's Progress   .  Exercise 150 min/wk Moderate Activity   Not on track    Walking is a great option.    . Patient Stated   On track    05/09/2019 AWV Goal: Keep All Scheduled Appointments  Over the next year, patient will attend all scheduled appointments  with their PCP and any specialists that they see.       Depression Screen PHQ 2/9 Scores 05/11/2020 05/20/2019 05/09/2019 05/08/2018 03/19/2018 11/16/2017 08/14/2017  PHQ - 2 Score 0 0 0 0 0 0 0    Fall Risk Fall Risk  05/11/2020 05/20/2019 05/09/2019 05/08/2018 03/19/2018  Falls in the past year? 1 0 1 0 0  Number falls in past yr: 1 - 0 - -  Injury with Fall? 1 - 0 - -  Risk for fall due to : History of fall(s);Orthopedic patient;Impaired vision - History of fall(s) - -  Follow up Falls prevention discussed;Education provided - Falls evaluation completed Falls prevention discussed;Education provided -    FALL RISK PREVENTION PERTAINING TO THE HOME:  Any stairs in or around the home? No  If so, are there any without handrails? No  Home free of loose throw rugs in walkways, pet beds, electrical cords, etc? Yes  Adequate lighting in your home to reduce risk of falls? Yes   ASSISTIVE DEVICES UTILIZED TO PREVENT FALLS:  Life alert? No  Use of a cane, walker or w/c? No  Grab bars in the bathroom? Yes  Shower chair or bench in shower? No  Elevated toilet seat or a handicapped toilet? No   TIMED UP AND GO:  Was the test performed? No . Telephonic visit.  Cognitive Function: Normal cognitive status assessed by direct observation by this Nurse Health Advisor. No abnormalities found.   MMSE - Mini Mental State Exam 04/12/2017 03/03/2016  Orientation to time 5 5  Orientation to Place 5 5  Registration 3 3  Attention/ Calculation 5 5  Recall 3 2  Language- name 2 objects 2 2  Language- repeat 1 1  Language- follow 3 step command 3 3  Language- read & follow direction 1 1  Write a sentence 1 1  Copy design 1 1  Total score 30 29     6CIT Screen 05/09/2019  05/08/2018  What Year? 0 points 0 points  What month? 0 points 0 points  What time? 0 points 0 points  Count back from 20 0 points 0 points  Months in reverse 0 points 0 points  Repeat phrase 0 points 4 points  Total Score 0 4    Immunizations Immunization History  Administered Date(s) Administered  . Influenza Split 10/03/2013  . Influenza Whole 10/04/2007  . Influenza, High Dose Seasonal PF 09/19/2012, 10/21/2015  . Pneumococcal Conjugate-13 05/26/2014  . Pneumococcal Polysaccharide-23 10/04/2007, 09/19/2012, 07/27/2015  . Pneumococcal-Unspecified 01/04/2004  . Tdap 01/03/2006, 01/03/2011    TDAP status: Due, Education has been provided regarding the importance of this vaccine. Advised may receive this vaccine at local pharmacy or Health Dept. Aware to provide a copy of the vaccination record if obtained from local pharmacy or Health Dept. Verbalized acceptance and understanding.  Flu Vaccine status: Due, Education has been provided regarding the importance of this vaccine. Advised may receive this vaccine at local pharmacy or Health Dept. Aware to provide a copy of the vaccination record if obtained from local pharmacy or Health Dept. Verbalized acceptance and understanding.  Pneumococcal vaccine status: Up to date  Covid-19 vaccine status: Declined, Education has been provided regarding the importance of this vaccine but patient still declined. Advised may receive this vaccine at local pharmacy or Health Dept.or vaccine clinic. Aware to provide a copy of the vaccination record if obtained from local pharmacy or Health Dept. Verbalized acceptance and understanding.  Qualifies for Shingles Vaccine? Yes  Zostavax completed No   Shingrix Completed?: No.    Education has been provided regarding the importance of this vaccine. Patient has been advised to call insurance company to determine out of pocket expense if they have not yet received this vaccine. Advised may also receive vaccine  at local pharmacy or Health Dept. Verbalized acceptance and understanding.  Screening Tests Health Maintenance  Topic Date Due  . COVID-19 Vaccine (1) Never done  . OPHTHALMOLOGY EXAM  10/02/2019  . HEMOGLOBIN A1C  11/20/2019  . COLONOSCOPY (Pts 45-54yrs Insurance coverage will need to be confirmed)  03/03/2020  . TETANUS/TDAP  11/17/2023 (Originally 01/02/2021)  . FOOT EXAM  05/19/2020  . INFLUENZA VACCINE  08/03/2020  . Hepatitis C Screening  Completed  . PNA vac Low Risk Adult  Completed  . HPV VACCINES  Aged Out    Health Maintenance  Health Maintenance Due  Topic Date Due  . COVID-19 Vaccine (1) Never done  . OPHTHALMOLOGY EXAM  10/02/2019  . HEMOGLOBIN A1C  11/20/2019  . COLONOSCOPY (Pts 45-79yrs Insurance coverage will need to be confirmed)  03/03/2020    Colorectal cancer screening: Type of screening: Colonoscopy. Completed 03/04/2010. Repeat every 10 years He is seeing specialist for lung cancer at this time and wants to wait to schedule  Lung Cancer Screening: (Low Dose CT Chest recommended if Age 65-80 years, 30 pack-year currently smoking OR have quit w/in 15years.) does qualify.   Lung Cancer Screening Referral: Already done  Additional Screening:  Hepatitis C Screening: does qualify; Completed 10/21/2014  Vision Screening: Recommended annual ophthalmology exams for early detection of glaucoma and other disorders of the eye. Is the patient up to date with their annual eye exam?  Yes  Who is the provider or what is the [facility] of the [facility] in which the patient [facility] If pt is not established with a provider, would they like to be referred to a provider to establish care? No .   Dental Screening: Recommended annual dental exams for proper oral hygiene  Community Resource Referral / Chronic Care Management: CRR required this visit?  No   CCM required this visit?  No      Plan:     I have personally reviewed and noted  the following in the patient's chart:   . Medical and social history . Use of alcohol, tobacco or illicit drugs  . Current medications and supplements including opioid prescriptions. Patient is currently taking opioid prescriptions. Information provided to patient regarding non-opioid alternatives. Patient advised to discuss non-opioid treatment plan with their provider. . Functional ability and status . Nutritional status . Physical activity . Advanced directives . List of other physicians . Hospitalizations, surgeries, and ER visits in previous 12 months . Vitals . Screenings to include cognitive, depression, and falls . Referrals and appointments  In addition, I have reviewed and discussed with patient certain preventive protocols, quality metrics, and best practice recommendations. A written personalized care plan for preventive services as well as general preventive health recommendations were provided to patient.     Sandrea Hammond, LPN   07/06/2593   Nurse Notes: He is currently seeing specialists at [facility] for lung cancer -wants to wait to schedule return here and wait for colonoscopy

## 2020-05-11 NOTE — Patient Instructions (Addendum)
[name] , Thank you for taking time to come for your Medicare Wellness Visit. I appreciate your ongoing commitment to your health goals. Please review the following plan we discussed and let me know if I can assist you in the future.   Screening recommendations/referrals: Colonoscopy: Done 03/04/2010 - DUE for repeat (10 years) Recommended yearly ophthalmology/optometry visit for glaucoma screening and checkup Recommended yearly dental visit for hygiene and checkup  Vaccinations: Influenza vaccine: Due in fall 2022 Pneumococcal vaccine: Done 05/26/2014 & 07/27/2015 Tdap vaccine: Done 01/03/2011 - repeat 10 years) Shingles vaccine: Shingrix discussed. Please contact your pharmacy for coverage information.    Covid-19: Declined  Advanced directives: Please bring a copy of your health care power of attorney and living will to the office to be added to your chart at your convenience.  Conditions/risks identified: Aim for 30 minutes of exercise or brisk walking each day, drink 6-8 glasses of water and eat lots of fruits and vegetables.  Next appointment: Follow up in one year for your annual wellness visit.   Preventive Care 68 Years and Older, Male  Preventive care refers to lifestyle choices and visits with your health care provider that can promote health and wellness. What does preventive care include?  A yearly physical exam. This is also called an annual well check.  Dental exams once or twice a year.  Routine eye exams. Ask your health care provider how often you should have your eyes checked.  Personal lifestyle choices, including:  Daily care of your teeth and gums.  Regular physical activity.  Eating a healthy diet.  Avoiding tobacco and drug use.  Limiting alcohol use.  Practicing safe sex.  Taking low doses of aspirin every day.  Taking vitamin and mineral supplements as recommended by your health care provider. What happens during an annual well check? The  services and screenings done by your health care provider during your annual well check will depend on your age, overall health, lifestyle risk factors, and family history of disease. Counseling  Your health care provider may ask you questions about your:  Alcohol use.  Tobacco use.  Drug use.  Emotional well-being.  Home and relationship well-being.  Sexual activity.  Eating habits.  History of falls.  Memory and ability to understand (cognition).  Work and work Statistician. Screening  You may have the following tests or measurements:  Height, weight, and BMI.  Blood pressure.  Lipid and cholesterol levels. These may be checked every 5 years, or more frequently if you are over 62 years old.  Skin check.  Lung cancer screening. You may have this screening every year starting at age 28 if you have a 30-pack-year history of smoking and currently smoke or have quit within the past 15 years.  Fecal occult blood test (FOBT) of the stool. You may have this test every year starting at age 9.  Flexible sigmoidoscopy or colonoscopy. You may have a sigmoidoscopy every 5 years or a colonoscopy every 10 years starting at age 65.  Prostate cancer screening. Recommendations will vary depending on your family history and other risks.  Hepatitis C blood test.  Hepatitis B blood test.  Sexually transmitted disease (STD) testing.  Diabetes screening. This is done by checking your blood sugar (glucose) after you have not eaten for a while (fasting). You may have this done every 1-3 years.  Abdominal aortic aneurysm (AAA) screening. You may need this if you are a current or former smoker.  Osteoporosis. You may be  screened starting at age 37 if you are at high risk. Talk with your health care provider about your test results, treatment options, and if necessary, the need for more tests. Vaccines  Your health care provider may recommend certain vaccines, such as:  Influenza  vaccine. This is recommended every year.  Tetanus, diphtheria, and acellular pertussis (Tdap, Td) vaccine. You may need a Td booster every 10 years.  Zoster vaccine. You may need this after age 74.  Pneumococcal 13-valent conjugate (PCV13) vaccine. One dose is recommended after age 73.  Pneumococcal polysaccharide (PPSV23) vaccine. One dose is recommended after age 54. Talk to your health care provider about which screenings and vaccines you need and how often you need them. This information is not intended to replace advice given to you by your health care provider. Make sure you discuss any questions you have with your health care provider. Document Released: 01/16/2015 Document Revised: 09/09/2015 Document Reviewed: 10/21/2014 Elsevier Interactive Patient Education  2017 [place] Prevention in the Home Falls can cause injuries. They can happen to people of all ages. There are many things you can do to make your home safe and to help prevent falls. What can I do on the outside of my home?  Regularly fix the edges of walkways and driveways and fix any cracks.  Remove anything that might make you trip as you walk through a door, such as a raised step or threshold.  Trim any bushes or trees on the path to your home.  Use bright outdoor lighting.  Clear any walking paths of anything that might make someone trip, such as rocks or tools.  Regularly check to see if handrails are loose or broken. Make sure that both sides of any steps have handrails.  Any raised decks and porches should have guardrails on the edges.  Have any leaves, snow, or ice cleared regularly.  Use sand or salt on walking paths during winter.  Clean up any spills in your garage right away. This includes oil or grease spills. What can I do in the bathroom?  Use night lights.  Install grab bars by the toilet and in the tub and shower. Do not use towel bars as grab bars.  Use non-skid mats or decals  in the tub or shower.  If you need to sit down in the shower, use a plastic, non-slip stool.  Keep the floor dry. Clean up any water that spills on the floor as soon as it happens.  Remove soap buildup in the tub or shower regularly.  Attach bath mats securely with double-sided non-slip rug tape.  Do not have throw rugs and other things on the floor that can make you trip. What can I do in the bedroom?  Use night lights.  Make sure that you have a light by your bed that is easy to reach.  Do not use any sheets or blankets that are too big for your bed. They should not hang down onto the floor.  Have a firm chair that has side arms. You can use this for support while you get dressed.  Do not have throw rugs and other things on the floor that can make you trip. What can I do in the kitchen?  Clean up any spills right away.  Avoid walking on wet floors.  Keep items that you use a lot in easy-to-reach places.  If you need to reach something above you, use a strong step stool that has a  grab bar.  Keep electrical cords out of the way.  Do not use floor polish or wax that makes floors slippery. If you must use wax, use non-skid floor wax.  Do not have throw rugs and other things on the floor that can make you trip. What can I do with my stairs?  Do not leave any items on the stairs.  Make sure that there are handrails on both sides of the stairs and use them. Fix handrails that are broken or loose. Make sure that handrails are as long as the stairways.  Check any carpeting to make sure that it is firmly attached to the stairs. Fix any carpet that is loose or worn.  Avoid having throw rugs at the top or bottom of the stairs. If you do have throw rugs, attach them to the floor with carpet tape.  Make sure that you have a light switch at the top of the stairs and the bottom of the stairs. If you do not have them, ask someone to add them for you. What else can I do to help  prevent falls?  Wear shoes that:  Do not have high heels.  Have rubber bottoms.  Are comfortable and fit you well.  Are closed at the toe. Do not wear sandals.  If you use a stepladder:  Make sure that it is fully opened. Do not climb a closed stepladder.  Make sure that both sides of the stepladder are locked into place.  Ask someone to hold it for you, if possible.  Clearly mark and make sure that you can see:  Any grab bars or handrails.  First and last steps.  Where the edge of each step is.  Use tools that help you move around (mobility aids) if they are needed. These include:  Canes.  Walkers.  Scooters.  Crutches.  Turn on the lights when you go into a dark area. Replace any light bulbs as soon as they burn out.  Set up your furniture so you have a clear path. Avoid moving your furniture around.  If any of your floors are uneven, fix them.  If there are any pets around you, be aware of where they are.  Review your medicines with your doctor. Some medicines can make you feel dizzy. This can increase your chance of falling. Ask your doctor what other things that you can do to help prevent falls. This information is not intended to replace advice given to you by your health care provider. Make sure you discuss any questions you have with your health care provider. Document Released: 10/16/2008 Document Revised: 05/28/2015 Document Reviewed: 01/24/2014 Elsevier Interactive Patient Education  2017 [employer].

## 2020-08-18 ENCOUNTER — Encounter: Payer: Self-pay | Admitting: Gastroenterology

## 2020-12-04 ENCOUNTER — Ambulatory Visit: Payer: Medicare HMO | Admitting: Family

## 2020-12-04 ENCOUNTER — Encounter: Payer: Self-pay | Admitting: Family

## 2020-12-07 ENCOUNTER — Encounter: Payer: Self-pay | Admitting: Family

## 2020-12-07 ENCOUNTER — Ambulatory Visit: Payer: Medicare HMO | Admitting: Family

## 2020-12-10 NOTE — Progress Notes (Signed)
Sent message, via epic in basket, requesting orders in epic from surgeon.  

## 2020-12-11 NOTE — Progress Notes (Addendum)
COVID swab appointment: n/a  COVID Vaccine Completed: no Date COVID Vaccine completed: Has received booster: COVID vaccine manufacturer: [place]   Date of COVID positive in last 90 days: no  PCP - Engineer, manufacturing - n/a  Chest x-ray - yes, req [facility] EKG - yes, req [facility] Stress Test - yes over 10 years ago per pt ECHO - n/a Cardiac Cath - n/a Pacemaker/ICD device last checked: n/a Spinal Cord Stimulator: n/a  Sleep Study - n/a CPAP -   Fasting Blood Sugar - 120s Checks Blood Sugar checks every other month, pt no longer on medications for 3 years  Blood Thinner Instructions: n/a Aspirin Instructions: Last Dose:  Activity level: Can go up a flight of stairs and perform activities of daily living without stopping and without symptoms of chest pain or shortness of breath.    Anesthesia review: HTN, COPD, DM 2, thrombocytopenia, SOB, renal and lung cancer  Patient denies shortness of breath, fever, cough and chest pain at PAT appointment   Patient verbalized understanding of instructions that were given to them at the PAT appointment. Patient was also instructed that they will need to review over the PAT instructions again at home before surgery.

## 2020-12-11 NOTE — Patient Instructions (Addendum)
DUE TO COVID-19 ONLY ONE VISITOR IS ALLOWED TO COME WITH YOU AND STAY IN THE WAITING ROOM ONLY DURING PRE OP AND PROCEDURE.   **NO VISITORS ARE ALLOWED IN THE SHORT STAY AREA OR RECOVERY ROOM!!**       Your procedure is scheduled on: 12/17/20   Report to [facility] Main Entrance    Report to admitting at 12:45 PM   Call this number if you have problems the morning of surgery [phone]   Do not eat food :After Midnight.   May have liquids until 12:00 pm day of surgery  CLEAR LIQUID DIET  Foods Allowed                                                                     Foods Excluded  Water, Black Coffee and tea (no milk or creamer)           liquids that you cannot  Plain Jell-O in any flavor  (No red)                                    see through such as: Fruit ices (not with fruit pulp)                                            milk, soups, orange juice              Iced Popsicles (No red)                                               All solid food                                   Apple juices Sports drinks like Gatorade (No red) Lightly seasoned clear broth or consume(fat free) Sugar   Oral Hygiene is also important to reduce your risk of infection.                                    Remember - BRUSH YOUR TEETH THE MORNING OF SURGERY WITH YOUR REGULAR TOOTHPASTE   Do NOT smoke after Midnight   Take these medicines the morning of surgery with A SIP OF WATER: Amlodipine, Lyrica, Pramacta   How to Manage Your Diabetes Before and After Surgery  Why is it important to control my blood sugar before and after surgery? Improving blood sugar levels before and after surgery helps healing and can limit problems. A way of improving blood sugar control is eating a healthy diet by:  Eating less sugar and carbohydrates  Increasing activity/exercise  Talking with your doctor about reaching your blood sugar goals High blood sugars (greater than 180 mg/dL) can raise your  risk of infections and slow your recovery, so you will need to focus  on controlling your diabetes during the weeks before surgery. Make sure that the doctor who takes care of your diabetes knows about your planned surgery including the date and location.  How do I manage my blood sugar before surgery? Check your blood sugar at least 4 times a day, starting 2 days before surgery, to make sure that the level is not too high or low. Check your blood sugar the morning of your surgery when you wake up and every 2 hours until you get to the Short Stay unit. If your blood sugar is less than 70 mg/dL, you will need to treat for low blood sugar: Do not take insulin. Treat a low blood sugar (less than 70 mg/dL) with  cup of clear juice (cranberry or apple), 4 glucose tablets, OR glucose gel. Recheck blood sugar in 15 minutes after treatment (to make sure it is greater than 70 mg/dL). If your blood sugar is not greater than 70 mg/dL on recheck, call [phone] for further instructions. Report your blood sugar to the short stay nurse when you get to Short Stay.  If you are admitted to the hospital after surgery: Your blood sugar will be checked by the staff and you will probably be given insulin after surgery (instead of oral diabetes medicines) to make sure you have good blood sugar levels. The goal for blood sugar control after surgery is 80-180 mg/dL.                              You may not have any metal on your body including  jewelry, and body piercing             Do not wear lotions, powders, cologne, or deodorant              Men may shave face and neck.   Do not bring valuables to the hospital. [place].   Contacts, dentures or bridgework may not be worn into surgery.    Patients discharged on the day of surgery will not be allowed to drive home.   Special Instructions: Bring a copy of your healthcare power of attorney and living will  documents         the day of surgery if you haven't scanned them before.              Please read over the following fact sheets you were given: IF YOU HAVE QUESTIONS ABOUT YOUR PRE-OP INSTRUCTIONS PLEASE CALL [place] - Preparing for Surgery Before surgery, you can play an important role.  Because skin is not sterile, your skin needs to be as free of germs as possible.  You can reduce the number of germs on your skin by washing with CHG (chlorahexidine gluconate) soap before surgery.  CHG is an antiseptic cleaner which kills germs and bonds with the skin to continue killing germs even after washing. Please DO NOT use if you have an allergy to CHG or antibacterial soaps.  If your skin becomes reddened/irritated stop using the CHG and inform your nurse when you arrive at Short Stay. Do not shave (including legs and underarms) for at least 48 hours prior to the first CHG shower.  You may shave your face/neck.  Please follow these instructions carefully:  1.  Shower with CHG Soap the night  before surgery and the  morning of surgery.  2.  If you choose to wash your hair, wash your hair first as usual with your normal  shampoo.  3.  After you shampoo, rinse your hair and body thoroughly to remove the shampoo.                             4.  Use CHG as you would any other liquid soap.  You can apply chg directly to the skin and wash.  Gently with a scrungie or clean washcloth.  5.  Apply the CHG Soap to your body ONLY FROM THE NECK DOWN.   Do   not use on face/ open                           Wound or open sores. Avoid contact with eyes, ears mouth and   genitals (private parts).                       Wash face,  Genitals (private parts) with your normal soap.             6.  Wash thoroughly, paying special attention to the area where your    surgery  will be performed.  7.  Thoroughly rinse your body with warm water from the neck down.  8.  DO NOT shower/wash with your normal  soap after using and rinsing off the CHG Soap.                9.  Pat yourself dry with a clean towel.            10.  Wear clean pajamas.            11.  Place clean sheets on your bed the night of your first shower and do not  sleep with pets. Day of Surgery : Do not apply any lotions/deodorants the morning of surgery.  Please wear clean clothes to the hospital/surgery center.  FAILURE TO FOLLOW THESE INSTRUCTIONS MAY RESULT IN THE CANCELLATION OF YOUR SURGERY  PATIENT SIGNATURE_________________________________  NURSE SIGNATURE__________________________________  ________________________________________________________________________

## 2020-12-14 ENCOUNTER — Other Ambulatory Visit: Payer: Self-pay

## 2020-12-14 ENCOUNTER — Encounter (HOSPITAL_COMMUNITY): Payer: Self-pay

## 2020-12-14 ENCOUNTER — Encounter (HOSPITAL_COMMUNITY)
Admission: RE | Admit: 2020-12-14 | Discharge: 2020-12-14 | Disposition: A | Discharge status: Home / Self Care | Payer: No Typology Code available for payment source | Source: Ambulatory Visit | Attending: Orthopedic Surgery | Admitting: Orthopedic Surgery

## 2020-12-14 VITALS — BP 141/78 | HR 73 | Temp 97.9°F | Resp 16 | Ht 67.0 in | Wt 162.6 lb

## 2020-12-14 DIAGNOSIS — E1159 Type 2 diabetes mellitus with other circulatory complications: Secondary | ICD-10-CM | POA: Diagnosis not present

## 2020-12-14 DIAGNOSIS — Z01812 Encounter for preprocedural laboratory examination: Secondary | ICD-10-CM | POA: Insufficient documentation

## 2020-12-14 DIAGNOSIS — I10 Essential (primary) hypertension: Secondary | ICD-10-CM | POA: Insufficient documentation

## 2020-12-14 DIAGNOSIS — E1165 Type 2 diabetes mellitus with hyperglycemia: Secondary | ICD-10-CM | POA: Diagnosis not present

## 2020-12-14 DIAGNOSIS — I152 Hypertension secondary to endocrine disorders: Secondary | ICD-10-CM | POA: Diagnosis not present

## 2020-12-14 HISTORY — DX: Unspecified osteoarthritis, unspecified site: M19.90

## 2020-12-14 LAB — BASIC METABOLIC PANEL
Anion gap: 10 (ref 5–15)
BUN: 11 mg/dL (ref 8–23)
CO2: 23 mmol/L (ref 22–32)
Calcium: 9.3 mg/dL (ref 8.9–10.3)
Chloride: 107 mmol/L (ref 98–111)
Creatinine, Ser: 0.94 mg/dL (ref 0.61–1.24)
GFR, Estimated: >60 mL/min (ref >60)
Glucose, Bld: 164 mg/dL — ABNORMAL HIGH (ref 70–99)
Potassium: 4.8 mmol/L (ref 3.5–5.1)
Sodium: 140 mmol/L (ref 135–145)

## 2020-12-14 LAB — GLUCOSE, CAPILLARY: Glucose-Capillary: 153 mg/dL — ABNORMAL HIGH (ref 70–99)

## 2020-12-14 LAB — CBC
HCT: 44.9 % (ref 39.0–52.0)
Hemoglobin: 14.9 g/dL (ref 13.0–17.0)
MCH: 32.3 pg (ref 26.0–34.0)
MCHC: 33.2 g/dL (ref 30.0–36.0)
MCV: 97.4 fL (ref 80.0–100.0)
Platelets: 145 10*3/uL — ABNORMAL LOW (ref 150–400)
RBC: 4.61 MIL/uL (ref 4.22–5.81)
RDW: 14.6 % (ref 11.5–15.5)
WBC: 8.7 10*3/uL (ref 4.0–10.5)
nRBC: 0 % (ref 0.0–0.2)

## 2020-12-14 LAB — HEMOGLOBIN A1C
Hgb A1c MFr Bld: 7.6 % — ABNORMAL HIGH (ref 4.8–5.6)
Mean Plasma Glucose: 171.42 mg/dL

## 2020-12-17 ENCOUNTER — Encounter (HOSPITAL_COMMUNITY): Payer: Self-pay | Admitting: Orthopedic Surgery

## 2020-12-17 ENCOUNTER — Ambulatory Visit (HOSPITAL_COMMUNITY): Payer: No Typology Code available for payment source | Admitting: Physician Assistant

## 2020-12-17 ENCOUNTER — Ambulatory Visit (HOSPITAL_COMMUNITY): Payer: No Typology Code available for payment source | Admitting: Certified Registered"

## 2020-12-17 ENCOUNTER — Encounter (HOSPITAL_COMMUNITY)
Admission: RE | Disposition: A | Discharge status: Home / Self Care | Payer: Self-pay | Source: Home / Self Care | Attending: Orthopedic Surgery

## 2020-12-17 ENCOUNTER — Ambulatory Visit (HOSPITAL_COMMUNITY)
Admission: RE | Admit: 2020-12-17 | Discharge: 2020-12-17 | Disposition: A | Discharge status: Home / Self Care | Payer: No Typology Code available for payment source | Attending: Orthopedic Surgery | Admitting: Orthopedic Surgery

## 2020-12-17 DIAGNOSIS — F17210 Nicotine dependence, cigarettes, uncomplicated: Secondary | ICD-10-CM | POA: Insufficient documentation

## 2020-12-17 DIAGNOSIS — M65811 Other synovitis and tenosynovitis, right shoulder: Secondary | ICD-10-CM | POA: Diagnosis not present

## 2020-12-17 DIAGNOSIS — M75121 Complete rotator cuff tear or rupture of right shoulder, not specified as traumatic: Secondary | ICD-10-CM | POA: Insufficient documentation

## 2020-12-17 DIAGNOSIS — M94211 Chondromalacia, right shoulder: Secondary | ICD-10-CM | POA: Insufficient documentation

## 2020-12-17 DIAGNOSIS — E119 Type 2 diabetes mellitus without complications: Secondary | ICD-10-CM | POA: Insufficient documentation

## 2020-12-17 DIAGNOSIS — M7501 Adhesive capsulitis of right shoulder: Secondary | ICD-10-CM | POA: Diagnosis not present

## 2020-12-17 DIAGNOSIS — M7541 Impingement syndrome of right shoulder: Secondary | ICD-10-CM | POA: Insufficient documentation

## 2020-12-17 DIAGNOSIS — M19011 Primary osteoarthritis, right shoulder: Secondary | ICD-10-CM | POA: Insufficient documentation

## 2020-12-17 HISTORY — PX: SHOULDER ARTHROSCOPY: SHX128

## 2020-12-17 LAB — GLUCOSE, CAPILLARY
Glucose-Capillary: 130 mg/dL — ABNORMAL HIGH (ref 70–99)
Glucose-Capillary: 137 mg/dL — ABNORMAL HIGH (ref 70–99)

## 2020-12-17 SURGERY — ARTHROSCOPY, SHOULDER
Anesthesia: General | Site: Shoulder | Laterality: Right

## 2020-12-17 MED ORDER — ONDANSETRON HCL 4 MG/2ML IJ SOLN
INTRAMUSCULAR | Status: AC
  Filled 2020-12-17: qty 2

## 2020-12-17 MED ORDER — PROPOFOL 10 MG/ML IV BOLUS
INTRAVENOUS | Status: DC | PRN
  Administered 2020-12-17: 40 mg via INTRAVENOUS
  Administered 2020-12-17: 160 mg via INTRAVENOUS

## 2020-12-17 MED ORDER — ACETAMINOPHEN 500 MG PO TABS
ORAL_TABLET | ORAL | Status: AC
  Administered 2020-12-17: 1000 mg via ORAL
  Filled 2020-12-17: qty 2

## 2020-12-17 MED ORDER — CYCLOBENZAPRINE HCL 10 MG PO TABS: 10.0000 mg | ORAL_TABLET | Freq: Three times a day (TID) | ORAL | 1 refills | Status: DC | PRN

## 2020-12-17 MED ORDER — CEFAZOLIN SODIUM-DEXTROSE 2-4 GM/100ML-% IV SOLN
2.0000 g | INTRAVENOUS | Status: AC
  Administered 2020-12-17: 2 g via INTRAVENOUS

## 2020-12-17 MED ORDER — ORAL CARE MOUTH RINSE: 15.0000 mL | Freq: Once | OROMUCOSAL | Status: AC

## 2020-12-17 MED ORDER — DEXAMETHASONE SODIUM PHOSPHATE 10 MG/ML IJ SOLN
INTRAMUSCULAR | Status: DC | PRN
  Administered 2020-12-17: 10 mg via INTRAVENOUS

## 2020-12-17 MED ORDER — FENTANYL CITRATE (PF) 100 MCG/2ML IJ SOLN
INTRAMUSCULAR | Status: AC
  Filled 2020-12-17: qty 2

## 2020-12-17 MED ORDER — FENTANYL CITRATE (PF) 100 MCG/2ML IJ SOLN
INTRAMUSCULAR | Status: DC | PRN
  Administered 2020-12-17: 50 ug via INTRAVENOUS

## 2020-12-17 MED ORDER — MIDAZOLAM HCL 2 MG/2ML IJ SOLN
INTRAMUSCULAR | Status: AC
  Administered 2020-12-17: 1 mg via INTRAVENOUS
  Filled 2020-12-17: qty 2

## 2020-12-17 MED ORDER — BUPIVACAINE LIPOSOME 1.3 % IJ SUSP
INTRAMUSCULAR | Status: DC | PRN
  Administered 2020-12-17: 10 mL via PERINEURAL

## 2020-12-17 MED ORDER — 0.9 % SODIUM CHLORIDE (POUR BTL) OPTIME
TOPICAL | Status: DC | PRN
  Administered 2020-12-17: 1000 mL

## 2020-12-17 MED ORDER — PROPOFOL 10 MG/ML IV BOLUS
INTRAVENOUS | Status: AC
  Filled 2020-12-17: qty 20

## 2020-12-17 MED ORDER — FENTANYL CITRATE PF 50 MCG/ML IJ SOSY
50.0000 ug | PREFILLED_SYRINGE | INTRAMUSCULAR | Status: DC
  Administered 2020-12-17: 50 ug via INTRAVENOUS

## 2020-12-17 MED ORDER — LACTATED RINGERS IV SOLN: INTRAVENOUS | Status: DC

## 2020-12-17 MED ORDER — ONDANSETRON HCL 4 MG PO TABS: 4.0000 mg | ORAL_TABLET | Freq: Three times a day (TID) | ORAL | 0 refills | Status: DC | PRN

## 2020-12-17 MED ORDER — ONDANSETRON HCL 4 MG/2ML IJ SOLN
INTRAMUSCULAR | Status: DC | PRN
  Administered 2020-12-17: 4 mg via INTRAVENOUS

## 2020-12-17 MED ORDER — BUPIVACAINE HCL (PF) 0.5 % IJ SOLN
INTRAMUSCULAR | Status: DC | PRN
  Administered 2020-12-17: 15 mL via PERINEURAL

## 2020-12-17 MED ORDER — SODIUM CHLORIDE 0.9 % IR SOLN
Status: DC | PRN
  Administered 2020-12-17: 3000 mL

## 2020-12-17 MED ORDER — CHLORHEXIDINE GLUCONATE 0.12 % MT SOLN
15.0000 mL | Freq: Once | OROMUCOSAL | Status: AC
  Administered 2020-12-17: 15 mL via OROMUCOSAL

## 2020-12-17 MED ORDER — PROMETHAZINE HCL 25 MG/ML IJ SOLN: 6.2500 mg | INTRAMUSCULAR | Status: DC | PRN

## 2020-12-17 MED ORDER — DEXAMETHASONE SODIUM PHOSPHATE 10 MG/ML IJ SOLN
INTRAMUSCULAR | Status: AC
  Filled 2020-12-17: qty 1

## 2020-12-17 MED ORDER — CEFAZOLIN SODIUM-DEXTROSE 2-4 GM/100ML-% IV SOLN
INTRAVENOUS | Status: AC
  Filled 2020-12-17: qty 100

## 2020-12-17 MED ORDER — MIDAZOLAM HCL 2 MG/2ML IJ SOLN: 1.0000 mg | INTRAMUSCULAR | Status: DC

## 2020-12-17 MED ORDER — OXYCODONE-ACETAMINOPHEN 5-325 MG PO TABS: 1.0000 | ORAL_TABLET | ORAL | 0 refills | Status: DC | PRN

## 2020-12-17 MED ORDER — ROCURONIUM BROMIDE 100 MG/10ML IV SOLN
INTRAVENOUS | Status: DC | PRN
  Administered 2020-12-17: 90 mg via INTRAVENOUS

## 2020-12-17 MED ORDER — ACETAMINOPHEN 500 MG PO TABS: 1000.0000 mg | ORAL_TABLET | Freq: Once | ORAL | Status: AC

## 2020-12-17 MED ORDER — AMISULPRIDE (ANTIEMETIC) 5 MG/2ML IV SOLN: 10.0000 mg | Freq: Once | INTRAVENOUS | Status: DC | PRN

## 2020-12-17 MED ORDER — NAPROXEN 500 MG PO TABS: 500.0000 mg | ORAL_TABLET | Freq: Two times a day (BID) | ORAL | 1 refills | Status: DC

## 2020-12-17 MED ORDER — FENTANYL CITRATE PF 50 MCG/ML IJ SOSY
PREFILLED_SYRINGE | INTRAMUSCULAR | Status: AC
  Administered 2020-12-17: 50 ug via INTRAVENOUS
  Filled 2020-12-17: qty 2

## 2020-12-17 MED ORDER — SUGAMMADEX SODIUM 200 MG/2ML IV SOLN
INTRAVENOUS | Status: DC | PRN
  Administered 2020-12-17: 300 mg via INTRAVENOUS

## 2020-12-17 MED ORDER — FENTANYL CITRATE PF 50 MCG/ML IJ SOSY: 25.0000 ug | PREFILLED_SYRINGE | INTRAMUSCULAR | Status: DC | PRN

## 2020-12-17 SURGICAL SUPPLY — 54 items
ANCHOR PEEK 4.75X19.1 SWLK C (Anchor) ×3 IMPLANT
BAG COUNTER SPONGE SURGICOUNT (BAG) ×2 IMPLANT
BLADE EXCALIBUR 4.0X13 (MISCELLANEOUS) ×2 IMPLANT
BOOTIES KNEE HIGH SLOAN (MISCELLANEOUS) ×4 IMPLANT
BURR OVAL 8 FLU 4.0X13 (MISCELLANEOUS) ×2 IMPLANT
CANNULA ACUFLEX KIT 5X76 (CANNULA) ×2 IMPLANT
CANNULA DRILOCK 5.0X75 (CANNULA) IMPLANT
CANNULA TWIST IN 8.25X7CM (CANNULA) IMPLANT
CONNECTOR 5 IN 1 STRAIGHT STRL (MISCELLANEOUS) ×2 IMPLANT
COOLER ICEMAN CLASSIC (MISCELLANEOUS) IMPLANT
DISSECTOR  3.8MM X 13CM (MISCELLANEOUS) ×1
DISSECTOR 3.8MM X 13CM (MISCELLANEOUS) ×1 IMPLANT
DRAPE INCISE 23X17 IOBAN STRL (DRAPES) ×1
DRAPE INCISE 23X17 STRL (DRAPES) ×1 IMPLANT
DRAPE INCISE IOBAN 23X17 STRL (DRAPES) ×1 IMPLANT
DRAPE INCISE IOBAN 66X45 STRL (DRAPES) ×2 IMPLANT
DRAPE ORTHO SPLIT 77X108 STRL (DRAPES)
DRAPE STERI 35X30 U-POUCH (DRAPES) ×2 IMPLANT
DRAPE SURG 17X11 SM STRL (DRAPES) ×2 IMPLANT
DRAPE SURG ORHT 6 SPLT 77X108 (DRAPES) IMPLANT
DRAPE U-SHAPE 47X51 STRL (DRAPES) ×2 IMPLANT
DRSG PAD ABDOMINAL 8X10 ST (GAUZE/BANDAGES/DRESSINGS) ×4 IMPLANT
DURAPREP 26ML APPLICATOR (WOUND CARE) ×2 IMPLANT
GAUZE SPONGE 4X4 12PLY STRL (GAUZE/BANDAGES/DRESSINGS) ×2 IMPLANT
GLOVE SURG ENC MOIS LTX SZ7.5 (GLOVE) ×2 IMPLANT
GLOVE SURG ENC MOIS LTX SZ8 (GLOVE) ×2 IMPLANT
GLOVE SURG MICRO LTX SZ7 (GLOVE) ×4 IMPLANT
GLOVE SURG MICRO LTX SZ7.5 (GLOVE) ×2 IMPLANT
GOWN STRL REUS W/TWL LRG LVL3 (GOWN DISPOSABLE) ×4 IMPLANT
KIT BASIN OR (CUSTOM PROCEDURE TRAY) ×2 IMPLANT
KIT SHOULDER TRACTION (DRAPES) ×2 IMPLANT
KIT TURNOVER KIT A (KITS) IMPLANT
MANIFOLD NEPTUNE II (INSTRUMENTS) ×2 IMPLANT
NDL SCORPION MULTI FIRE (NEEDLE) IMPLANT
NEEDLE SCORPION MULTI FIRE (NEEDLE) ×2 IMPLANT
NS IRRIG 1000ML POUR BTL (IV SOLUTION) ×2 IMPLANT
PACK ARTHROSCOPY WL (CUSTOM PROCEDURE TRAY) ×2 IMPLANT
PAD ARMBOARD 7.5X6 YLW CONV (MISCELLANEOUS) ×2 IMPLANT
PAD COLD SHLDR WRAP-ON (PAD) IMPLANT
PROBE APOLLO 90XL (SURGICAL WAND) ×2 IMPLANT
SLING ARM FOAM STRAP MED (SOFTGOODS) ×1 IMPLANT
SPONGE T-LAP 4X18 ~~LOC~~+RFID (SPONGE) ×2 IMPLANT
STRIP CLOSURE SKIN 1/2X4 (GAUZE/BANDAGES/DRESSINGS) ×2 IMPLANT
SUT FIBERWIRE #2 38 T-5 BLUE (SUTURE) ×6
SUT MNCRL AB 3-0 PS2 18 (SUTURE) ×2 IMPLANT
SUT PDS AB 0 CT 36 (SUTURE) IMPLANT
SUT TIGER TAPE 7 IN WHITE (SUTURE) ×2 IMPLANT
SUTURE FIBERWR #2 38 T-5 BLUE (SUTURE) IMPLANT
TAPE FIBER 2MM 7IN #2 BLUE (SUTURE) IMPLANT
TAPE PAPER 3X10 WHT MICROPORE (GAUZE/BANDAGES/DRESSINGS) ×2 IMPLANT
TOWEL OR 17X26 10 PK STRL BLUE (TOWEL DISPOSABLE) ×2 IMPLANT
TUBING ARTHROSCOPY IRRIG 16FT (MISCELLANEOUS) ×2 IMPLANT
WATER STERILE IRR 1000ML POUR (IV SOLUTION) ×2 IMPLANT
YANKAUER SUCT BULB TIP 10FT TU (MISCELLANEOUS) ×2 IMPLANT

## 2020-12-17 NOTE — Op Note (Signed)
12/17/2020  3:31 PM  PATIENT:   Dalton Miller  75 y.o. male  PRE-OPERATIVE DIAGNOSIS:  Right shoulder impingement, acromioclavicular osteoarthritis  POST-OPERATIVE DIAGNOSIS: Same with degenerative full-thickness rotator cuff tear and degenerative labral tear  PROCEDURE:  1.  Right shoulder examination under anesthesia.  2.  Right shoulder glenohumeral joint diagnostic arthroscopy  3.  Debridement of degenerative labral tear and synovectomy  4.  Arthroscopic subacromial decompression and bursectomy  5.  Arthroscopic distal clavicle resection  6.  Arthroscopic rotator cuff repair using a double row suture bridge repair construct    SURGEON:  Blanca Carreon, Metta Clines M.D.  ASSISTANTS: Jenetta Loges, PA-C  ANESTHESIA:   General endotracheal and interscalene block with Exparel  EBL: Minimal  SPECIMEN: None  Drains: None   PATIENT DISPOSITION:  PACU - hemodynamically stable.    PLAN OF CARE: Discharge to home after PACU  Brief history:  Patient is a 75 year old male who has had chronic and progressively increasing right shoulder pain related to impingement syndrome and symptomatic AC joint arthritis with a recent MRI scan showing severe rotator cuff tendinosis but no obvious discrete full-thickness retracted rotator cuff tear.  Due to his ongoing pain and functional limitations and failure to respond to prolonged attempts at conservative management, he is brought to the operating this time for planned right shoulder arthroscopy as described below.  Preoperatively, I counseled the patient regarding treatment options and risks versus benefits thereof.  Possible surgical complications were all reviewed including potential for bleeding, infection, neurovascular injury, persistent pain, loss of motion, anesthetic complication, failure of the implant, and possible need for additional surgery. They understand and accept and agrees with our planned procedure.   Procedure in  detail:  After undergoing routine preop evaluation the patient received prophylactic antibiotics and interscalene block with Exparel was established in the holding area by the anesthesia department.  Subsequently placed supine on the operating table and underwent the smooth induction of a general endotracheal anesthesia.  Placed into the left lateral decubitus position on the beanbag and appropriately padded and protected.  Right shoulder examination under anesthesia revealed some mild to moderate restrictions in mobility but no significant improvement in mobility was achieved with manipulation.  The right arm was then suspended at approximately 70 degrees of abduction with 10 pounds of traction the right shoulder region was sterilely prepped and draped in standard fashion.  Timeout was called.  A posterior portal established into the glenohumeral joint and anterior portal established under direct visualization.  There was some moderate chondromalacia of the humeral head with some generalized chondral thinning but no obvious debride we will flaps.  There was extensive degenerative tearing of the anterior superior and posterior superior labrum all of which were debrided with a shaver back to stable margin.  Biceps tendon showed some mild tenosynovitis which was gently debrided.  There was significant articular sided tearing of the distal supraspinatus with calcifications in these areas were all debrided with a shaver and this did appear to be in a full-thickness defect of the supraspinatus.  At this point final inspection and irrigation was completed within the glenohumeral joint.  Fluid and instruments were removed.  The arm was then dropped down to 30 degrees of abduction with the arthroscope introduced into the subacromial space of the posterior portal and a direct lateral portal was established in the subacromial space.  Abundant dense bursal tissue and multiple adhesions were encountered throughout the  subacromial/subdeltoid region.  Extensive bursectomy and lysis of adhesions was  completed.  Arthrex wand was used to remove the periosteum from the undersurface of the anterior half of the acromion and a subacromial decompression was performed with a bur crating a type I morphology and nicely decompressing the severely stenotic subacromial space.  A portal was then established directly anterior to the distal clavicle and a distal clavicle resection was performed with a bur.  Care was taken to confirm visualization of the entire circumference of the distal clavicle to ensure adequate removal of bone.  We then completed the subacromial/subdeltoid bursectomy.  Inspection of the rotator cuff showed an obvious full-thickness defect involving the distal supraspinatus.  The tendon tissue was debrided back to healthy tissue with ultimately footprint approximate 2 cm in width.  We prepared the greater tuberosity abrading the bone to bleeding bed.  Through a stab wound on the lateral margin of the acromion placed a 4.75 peek swivel lock suture anchor loaded with tooth fiber tapes.  The 6 suture limbs were then shuttled equidistant across the width of the rotator cuff tear using the scorpion suture passer and the suture limbs were then passed in an alternating fashion into 2 lateral row anchors creating a double row repair nicely compression the free margin of the rotator cuff tendon against the bony bed on the tuberosity.  Suture limbs were all then clipped.  Final debridement was completed.  Hemostasis was obtained.  Fluid and incensed removed.  The portals were closed with a Monocryl and a Steri-Strip.  A dry dressing was taped about the right shoulder right arm was placed in a sling immobilizer with abduction pillow.  Patient was then awakened, extubated, and taken to the recovery room in stable condition.  Jenetta Loges, PA-C was utilized as an Environmental consultant throughout this case, essential for help with positioning the  patient, positioning extremity, tissue manipulation, implantation of the prosthesis, suture management, wound closure, and intraoperative decision-making.  Marin Shutter MD   Contact # [phone]

## 2020-12-17 NOTE — Transfer of Care (Signed)
Immediate Anesthesia Transfer of Care Note  Patient: Dalton Miller  Procedure(s) Performed: Right shoulder arthroscopy, debridement, subacromial decompression, distal clavicle resection (Right: Shoulder)  Patient Location: PACU  Anesthesia Type:General and Regional  Level of Consciousness: drowsy and patient cooperative  Airway & Oxygen Therapy: Patient Spontanous Breathing and Patient connected to face mask oxygen  Post-op Assessment: Report given to RN and Post -op Vital signs reviewed and stable  Post vital signs: Reviewed and stable  Last Vitals:  Vitals Value Taken Time  BP 153/84 12/17/20 1548  Temp    Pulse 70 12/17/20 1553  Resp 29 12/17/20 1553  SpO2 97 % 12/17/20 1553  Vitals shown include unvalidated device data.  Last Pain:  Vitals:   12/17/20 1324  TempSrc:   PainSc: 0-No pain      Patients Stated Pain Goal: 6 (47/15/80 6386)  Complications: No notable events documented.

## 2020-12-17 NOTE — H&P (Signed)
Dalton Miller    Chief Complaint: Right shoulder impingement, acromioclavicular osteoarthritis HPI: The patient is a 75 y.o. male with chronic right shoulder pain with underlying impingement syndrome and symptomatic AC joint arthritis.  Recent MRI scan does show significant rotator cuff tendinosis but no obvious discrete full-thickness retracted tear.  Due to his ongoing right shoulder pain and failure to respond to prolonged attempts at conservative management, he is brought to the operating this time for planned right shoulder arthroscopy  Past Medical History:  Diagnosis Date   Acquired renal cyst of right kidney    Arthritis    Cancer ([place])    renal cell    Erectile dysfunction    Heart murmur    CHILDHOOD   Hematuria    left ureteral bleeding   History of cellulitis    2012 left wrist   History of kidney stones    Hyperlipidemia    Hypertension    Metastatic melanoma to lung, left ([place])    Nephrolithiasis    bilateral non-obstructive per ct 07-02-2015   Type 2 diabetes mellitus ([place])    Wears glasses     Past Surgical History:  Procedure Laterality Date   CARPAL TUNNEL RELEASE Right 1990  approx   COLONOSCOPY  03/04/2010   CYSTOSCOPY W/ RETROGRADES Left 10/28/2015   Procedure: CYSTOSCOPY WITH LEFT RETROGRADE PYELOGRAM;  Surgeon: Cleon Gustin, MD;  Location: [facility];  Service: Urology;  Laterality: Left;   CYSTOSCOPY W/ URETERAL STENT PLACEMENT Left 10/28/2015   Procedure: CYSTOSCOPY WITH LEFT URETERAL STENT EXCHANGE;  Surgeon: Cleon Gustin, MD;  Location: [facility];  Service: Urology;  Laterality: Left;   CYSTOSCOPY WITH RETROGRADE PYELOGRAM, URETEROSCOPY AND STENT PLACEMENT Bilateral 09/17/2015   Procedure: CYSTOSCOPY WITH BILATERAL RETROGRADE PYELOGRAM, LEFT URETEROSCOPY WITH URETERAL BIOPSY AND  STENT PLACEMENT;  Surgeon: Irine Seal, MD;  Location: [facility];  Service: Urology;  Laterality: Bilateral;   HOLMIUM LASER APPLICATION Left [id number]/9373    Procedure: HOLMIUM LASER APPLICATION;  Surgeon: Irine Seal, MD;  Location: [facility];  Service: Urology;  Laterality: Left;   KNEE ARTHROSCOPY Right 2008   MELANOMA EXCISION  2018   Left arm   right total knee replacement  Right    2009   ROBOT ASSITED LAPAROSCOPIC NEPHROURETERECTOMY Left 12/04/2015   Procedure: XI ROBOT ASSITED LAPAROSCOPIC NEPHROURETERECTOMY;  Surgeon: Cleon Gustin, MD;  Location: [facility];  Service: Urology;  Laterality: Left;   STONE EXTRACTION WITH BASKET Left 09/17/2015   Procedure: STONE EXTRACTION WITH BASKET;  Surgeon: Irine Seal, MD;  Location: [facility];  Service: Urology;  Laterality: Left;   TOTAL KNEE ARTHROPLASTY Right 2009   URETEROSCOPY  10/28/2015   Procedure: DIAGNOSTIC LEFT URETEROSCOPY;  Surgeon: Cleon Gustin, MD;  Location: [facility];  Service: Urology;;   WISDOM TOOTH EXTRACTION      Family History  Problem Relation Age of Onset   Diabetes Mother    Heart disease Mother    Stroke Mother    CAD Mother    Diabetes Father    Congestive Heart Failure Father    Cancer Brother    Heart disease Son    COPD Son    Emphysema Son    Heart attack Brother     Social History:  reports that he has been smoking cigars and cigarettes. He has a 94.50 pack-year smoking history. He quit smokeless tobacco use about 27 years ago.  His smokeless tobacco use included chew. He  reports that he does not drink alcohol and does not use drugs.   Medications Prior to Admission  Medication Sig Dispense Refill   amLODipine (NORVASC) 2.5 MG tablet Take 2.5 mg by mouth daily.     carboxymethylcellulose (REFRESH PLUS) 0.5 % SOLN Place 1 drop into both eyes in the morning, at noon, and at bedtime.     eltrombopag (PROMACTA) 25 MG tablet Take 25 mg by mouth daily.     loratadine (CLARITIN) 10 MG tablet Take 10 mg by mouth daily.     pregabalin (LYRICA) 75 MG capsule Take 75 mg by mouth 2 (two) times daily.     White  Petrolatum-Mineral Oil (REFRESH P.M. OP) Place 1 application into both eyes at bedtime.     aspirin EC 81 MG tablet Take 1 tablet (81 mg total) by mouth daily. (Patient not taking: Reported on 12/09/2020) 90 tablet 1   atorvastatin (LIPITOR) 10 MG tablet Take 1 tablet (10 mg total) by mouth daily. (Patient not taking: Reported on 12/09/2020) 90 tablet 2   fluticasone (FLONASE) 50 MCG/ACT nasal spray SPRAY 2 SPRAYS INTO EACH NOSTRIL EVERY DAY (Patient not taking: Reported on 12/09/2020) 48 mL 1   lisinopril (ZESTRIL) 10 MG tablet TAKE 1 TABLET (10 MG TOTAL) BY MOUTH DAILY. NEEDS TO BE SEEN BEFORE NEXT REFILL. (Patient not taking: Reported on 12/09/2020) 30 tablet 0     Physical Exam: Right shoulder demonstrates painful and guarded motion is noted at his recent office visit.  He has fair strength testing of the rotator cuff.  Positive impingement sign.  Plain radiographs confirm advanced AC joint arthritis.  MRI scan shows significant bony impingement as well as rotator cuff tendinosis with intrasubstance degeneration but no obvious full-thickness retracted rotator cuff tear.  Vitals  Temp:  [97.5 F (36.4 C)] 97.5 F (36.4 C) (12/15 1258) Pulse Rate:  [65] 65 (12/15 1258) Resp:  [18] 18 (12/15 1258) BP: (149)/(84) 149/84 (12/15 1258) SpO2:  [100 %] 100 % (12/15 1258) Weight:  [73.8 kg] 73.8 kg (12/15 1257)  Assessment/Plan  Impression: Right shoulder impingement, acromioclavicular osteoarthritis  Plan of Action: Procedure(s): Right shoulder arthroscopy, debridement, subacromial decompression, distal clavicle resection  Effie Janoski M Donelda Mailhot 12/17/2020, 1:23 PM Contact # [phone]

## 2020-12-17 NOTE — Anesthesia Postprocedure Evaluation (Signed)
Anesthesia Post Note  Patient: Dalton Miller  Procedure(s) Performed: Right shoulder arthroscopy, debridement, subacromial decompression, distal clavicle resection (Right: Shoulder)     Patient location during evaluation: PACU Anesthesia Type: General Level of consciousness: sedated Pain management: pain level controlled Vital Signs Assessment: post-procedure vital signs reviewed and stable Respiratory status: spontaneous breathing and respiratory function stable Cardiovascular status: stable Postop Assessment: no apparent nausea or vomiting Anesthetic complications: no   No notable events documented.  Last Vitals:  Vitals:   12/17/20 1645 12/17/20 1651  BP: (!) 133/94   Pulse: 74 72  Resp: 15 16  Temp: (!) 36.3 C   SpO2: 93% 95%    Last Pain:  Vitals:   12/17/20 1651  TempSrc:   PainSc: 0-No pain                 Lizzette Carbonell DANIEL

## 2020-12-17 NOTE — Anesthesia Procedure Notes (Signed)
Procedure Name: Intubation Date/Time: 12/17/2020 2:09 PM Performed by: Gwyndolyn Saxon, CRNA Pre-anesthesia Checklist: Patient identified, Emergency Drugs available, Suction available and Patient being monitored Patient Re-evaluated:Patient Re-evaluated prior to induction Oxygen Delivery Method: Circle system utilized Preoxygenation: Pre-oxygenation with 100% oxygen Induction Type: IV induction Ventilation: Mask ventilation without difficulty Laryngoscope Size: Miller and 2 Grade View: Grade I Tube type: Oral Tube size: 7.5 mm Number of attempts: 1 Airway Equipment and Method: Stylet Placement Confirmation: ETT inserted through vocal cords under direct vision, positive ETCO2 and breath sounds checked- equal and bilateral Secured at: 23 cm Tube secured with: Tape Dental Injury: Teeth and Oropharynx as per pre-operative assessment

## 2020-12-17 NOTE — Discharge Instructions (Signed)
° °  Dalton Miller. Supple, M.D., F.A.A.O.S. Orthopaedic Surgery Specializing in Arthroscopic and Reconstructive Surgery of the Shoulder [phone] [address] [place], [place] [address] - Fax [phone]  POST-OP SHOULDER ARTHROSCOPIC ROTATOR CUFF AND/OR LABRAL REPAIR INSTRUCTIONS  1. Call the office at [phone] to schedule your first post-op appointment 7-10 days from the date of your surgery.  2. Leave the steri-strips in place over your incisions when performing dressing changes and showering. You may remove your dressings and begin showering 72 hours from surgery. You can expect drainage that is clear to bloody in nature that occasionally will soak through your dressings. If this occurs go ahead and perform a dressing change. The drainage should lessen daily and when there is no drainage from your incisions feel free to go without a dressing.  3. Wear your sling/immobilizer at all times except to perform the exercises below or to occasionally let your arm dangle by your side to stretch your elbow. You also need to sleep in your sling immobilizer until instructed otherwise.  4. Range of motion to your elbow, wrist, and hand are encouraged 3-5 times daily. Exercise to your hand and fingers helps to reduce swelling you may experience.  5. Utilize ice with the ice/cooling unit per instructions  6. You may one-armed drive when safely off of narcotics and muscle relaxants. You may use your hand that is in the sling to support the steering wheel only. However, should it be your right arm that is in the sling it is not to be used for gear shifting in a manual transmission.  7. Pain control following an exparel block  To help control your post-operative pain you received a nerve block  performed with Exparel which is a long acting anesthetic (numbing agent) which can provide pain relief and sensations of numbness (and relief of pain) in the operative shoulder and arm for up to 3  days. Sometimes it provides mixed relief, meaning you may still have numbness in certain areas of the arm but can still be able to move  parts of that arm, hand, and fingers. We recommend that your prescribed pain medications  be used as needed. We do not feel it is necessary to "pre medicate" and "stay ahead" of pain.  Taking narcotic pain medications when you are not having any pain can lead to unnecessary and potentially dangerous side effects.    8. Pain medications can produce constipation along with their use. If you experience this, the use of an over the counter stool softener or laxative daily is recommended.   9. For additional questions or concerns, please do not hesitate to call the office. If after hours there is an answering service to forward your concerns to the physician on call.  POST-OP EXERCISES  Pendulum Exercises  Perform pendulum exercises while standing and bending at the waist. Support your uninvolved arm on a table or chair and allow your operated arm to hang freely. Make sure to do these exercises passively - not using you shoulder muscle.  Repeat 20 times. Do 3 sessions per day.

## 2020-12-17 NOTE — Anesthesia Procedure Notes (Signed)
Anesthesia Regional Block: Interscalene brachial plexus block   Pre-Anesthetic Checklist: , timeout performed,  Correct Patient, Correct Site, Correct Laterality,  Correct Procedure, Correct Position, site marked,  Risks and benefits discussed,  Surgical consent,  Pre-op evaluation,  At surgeon's request and post-op pain management  Laterality: Right  Prep: chloraprep       Needles:  Injection technique: Single-shot  Needle Type: Echogenic Stimulator Needle     Needle Length: 5cm  Needle Gauge: 22     Additional Needles:   Narrative:  Start time: 12/17/2020 1:14 PM End time: 12/17/2020 1:24 PM Injection made incrementally with aspirations every 5 mL.  Performed by: Personally  Anesthesiologist: Duane Boston, MD  Additional Notes: Functioning IV was confirmed and monitors applied.  A 19mm 22ga echogenic arrow stimulator was used. Sterile prep and drape,hand hygiene and sterile gloves were used.Ultrasound guidance: relevant anatomy identified, needle position confirmed, local anesthetic spread visualized around nerve(s)., vascular puncture avoided.  Image printed for medical record.  Negative aspiration and negative test dose prior to incremental administration of local anesthetic. The patient tolerated the procedure well.

## 2020-12-17 NOTE — Anesthesia Preprocedure Evaluation (Addendum)
Anesthesia Evaluation  Patient identified by MRN, date of birth, ID band Patient awake    Reviewed: Allergy & Precautions, NPO status , Patient's Chart, lab work & pertinent test results  History of Anesthesia Complications Negative for: history of anesthetic complications  Airway Mallampati: II  TM Distance: >3 FB Neck ROM: Full    Dental  (+) Edentulous Upper, Poor Dentition, Dental Advisory Given   Pulmonary COPD, Current Smoker,    Pulmonary exam normal        Cardiovascular hypertension, Pt. on medications Normal cardiovascular exam     Neuro/Psych Anxiety negative neurological ROS     GI/Hepatic negative GI ROS, Neg liver ROS,   Endo/Other  diabetes  Renal/GU negative Renal ROS     Musculoskeletal negative musculoskeletal ROS (+)   Abdominal   Peds  Hematology negative hematology ROS (+)   Anesthesia Other Findings   Reproductive/Obstetrics                            Anesthesia Physical Anesthesia Plan  ASA: 3  Anesthesia Plan: General   Post-op Pain Management: Regional block and Tylenol PO (pre-op)   Induction: Intravenous  PONV Risk Score and Plan: 1 and Ondansetron and Dexamethasone  Airway Management Planned: Oral ETT  Additional Equipment:   Intra-op Plan:   Post-operative Plan: Extubation in OR  Informed Consent: I have reviewed the patients History and Physical, chart, labs and discussed the procedure including the risks, benefits and alternatives for the proposed anesthesia with the patient or authorized representative who has indicated his/her understanding and acceptance.     Dental advisory given  Plan Discussed with: Anesthesiologist and CRNA  Anesthesia Plan Comments:        Anesthesia Quick Evaluation

## 2020-12-17 NOTE — Progress Notes (Signed)
Assisted Dr. Tobias Alexander with right, ultrasound guided, interscalene brachial plexus block. Side rails up, monitors on throughout procedure. See vital signs in flow sheet. Tolerated Procedure well. Exparel administered also.

## 2020-12-18 ENCOUNTER — Encounter (HOSPITAL_COMMUNITY): Payer: Self-pay | Admitting: Orthopedic Surgery

## 2020-12-22 ENCOUNTER — Other Ambulatory Visit: Payer: Self-pay

## 2020-12-22 ENCOUNTER — Ambulatory Visit (HOSPITAL_COMMUNITY): Payer: No Typology Code available for payment source | Attending: Orthopedic Surgery | Admitting: Occupational Therapy

## 2020-12-22 ENCOUNTER — Encounter (HOSPITAL_COMMUNITY): Payer: Self-pay | Admitting: Occupational Therapy

## 2020-12-22 DIAGNOSIS — R29898 Other symptoms and signs involving the musculoskeletal system: Secondary | ICD-10-CM | POA: Diagnosis present

## 2020-12-22 DIAGNOSIS — M25611 Stiffness of right shoulder, not elsewhere classified: Secondary | ICD-10-CM | POA: Insufficient documentation

## 2020-12-22 DIAGNOSIS — M25511 Pain in right shoulder: Secondary | ICD-10-CM | POA: Diagnosis present

## 2020-12-22 NOTE — Patient Instructions (Signed)
°  1) ROM: Pendulum (Side-to-Side)    Bend forward 90 at waist, using table for support. Rock body side to side to swing arm. Repeat _10___ times. Do __2-3__ sessions per day.   Copyright  VHI. All rights reserved.  2) Pendulum Forward/Back   Bend forward 90 at waist, using table for support. Rock body forward and back to swing arm. Repeat _10___ times. Do __2-3__ sessions per day.  Copyright  VHI. All rights reserved.  3) Pendulum Circular   Bend forward 90 at waist, leaning on table for support. Rock body in a circular pattern to move arm clockwise __10__ times then counterclockwise __10__ times. Do __2-3__ sessions per day.  Copyright  VHI. All rights reserved.  4) AROM: Wrist Extension   With right palm down, bend wrist up. Repeat 10____ times per set. Do ____ sets per session. Do __3__ sessions per day.  Copyright  VHI. All rights reserved.   5) AROM: Wrist Flexion   With right palm up, bend wrist up. Repeat ___10_ times per set. Do ____ sets per session. Do __3__ sessions per day.  Copyright  VHI. All rights reserved.   6) AROM: Forearm Pronation / Supination   With right arm in handshake position, slowly rotate palm down until stretch is felt. Relax. Then rotate palm up until stretch is felt. Repeat __10__ times per set. Do __2-3__ sessions per day.  Copyright  VHI. All rights reserved.   7) Flexion (Passive)   Use other hand to bend elbow, with thumb toward same shoulder.  Repeat __10__ times. Do __2-3__ sessions per day.

## 2020-12-22 NOTE — Therapy (Signed)
[place], [place], [address] Phone: [phone]   Fax:  [phone]  Occupational Therapy Evaluation  Patient Details  Name: Dalton Miller MRN: [id number] Date of Birth: 11/30/1945 Referring Provider (OT): Dr. Justice Britain   Encounter Date: 12/22/2020   OT End of Session - 12/22/20 0851     Visit Number 1    Number of Visits 16    Date for OT Re-Evaluation 02/20/21   mini reassessment 01/21/2021   Authorization Type 1) [place] 2) [employer] Medicare    Authorization Time Period 15 visits approved    Authorization - Visit Number 1    Authorization - Number of Visits 15    Progress Note Due on Visit 10    OT Start Time 0815    OT Stop Time [phone]    OT Time Calculation (min) 27 min    Activity Tolerance Patient tolerated treatment well    Behavior During Therapy [facility] for tasks assessed/performed             Past Medical History:  Diagnosis Date   Acquired renal cyst of right kidney    Arthritis    Cancer ([place])    renal cell    Erectile dysfunction    Heart murmur    CHILDHOOD   Hematuria    left ureteral bleeding   History of cellulitis    2012 left wrist   History of kidney stones    Hyperlipidemia    Hypertension    Metastatic melanoma to lung, left ([place])    Nephrolithiasis    bilateral non-obstructive per ct 07-02-2015   Type 2 diabetes mellitus ([place])    Wears glasses     Past Surgical History:  Procedure Laterality Date   CARPAL TUNNEL RELEASE Right 1990  approx   COLONOSCOPY  03/04/2010   CYSTOSCOPY W/ RETROGRADES Left 10/28/2015   Procedure: CYSTOSCOPY WITH LEFT RETROGRADE PYELOGRAM;  Surgeon: Cleon Gustin, MD;  Location: [facility];  Service: Urology;  Laterality: Left;   CYSTOSCOPY W/ URETERAL STENT PLACEMENT Left 10/28/2015   Procedure: CYSTOSCOPY WITH LEFT URETERAL STENT EXCHANGE;  Surgeon: Cleon Gustin, MD;  Location: [facility];  Service: Urology;  Laterality: Left;   CYSTOSCOPY WITH  RETROGRADE PYELOGRAM, URETEROSCOPY AND STENT PLACEMENT Bilateral 09/17/2015   Procedure: CYSTOSCOPY WITH BILATERAL RETROGRADE PYELOGRAM, LEFT URETEROSCOPY WITH URETERAL BIOPSY AND  STENT PLACEMENT;  Surgeon: Irine Seal, MD;  Location: [facility];  Service: Urology;  Laterality: Bilateral;   HOLMIUM LASER APPLICATION Left 85/63/1497   Procedure: HOLMIUM LASER APPLICATION;  Surgeon: Irine Seal, MD;  Location: [facility];  Service: Urology;  Laterality: Left;   KNEE ARTHROSCOPY Right 2008   MELANOMA EXCISION  2018   Left arm   right total knee replacement  Right    2009   ROBOT ASSITED LAPAROSCOPIC NEPHROURETERECTOMY Left 12/04/2015   Procedure: XI ROBOT ASSITED LAPAROSCOPIC NEPHROURETERECTOMY;  Surgeon: Cleon Gustin, MD;  Location: [facility];  Service: Urology;  Laterality: Left;   SHOULDER ARTHROSCOPY Right 12/17/2020   Procedure: Right shoulder arthroscopy, debridement, subacromial decompression, distal clavicle resection;  Surgeon: Justice Britain, MD;  Location: [facility];  Service: Orthopedics;  Laterality: Right;  40min   STONE EXTRACTION WITH BASKET Left 09/17/2015   Procedure: STONE EXTRACTION WITH BASKET;  Surgeon: Irine Seal, MD;  Location: [facility];  Service: Urology;  Laterality: Left;   TOTAL KNEE ARTHROPLASTY Right 2009   URETEROSCOPY  10/28/2015  Procedure: DIAGNOSTIC LEFT URETEROSCOPY;  Surgeon: Cleon Gustin, MD;  Location: [facility];  Service: Urology;;   WISDOM TOOTH EXTRACTION      There were no vitals filed for this visit.   Subjective Assessment - 12/22/20 0841     Subjective  S: It's been hurting.    Pertinent History Pt is a 75 y/o male s/p arthroscopic RCR, DCR, SAD, bursectomy, debridement of degenerative labral tear on 12/17/20. Pt presents in abduction sling, reports he has been using ice machine and pain medication for pain management. Pt was referred to occupational therapy for evaluation and treatment by Dr.  Justice Britain.    Special Tests FOTO: 4/100    Patient Stated Goals To be able to use my arm.    Currently in Pain? Yes    Pain Score 7     Pain Location Shoulder    Pain Orientation Right    Pain Descriptors / Indicators Aching;Sore    Pain Type Acute pain;Surgical pain    Pain Radiating Towards elbow    Pain Onset 1 to 4 weeks ago    Pain Frequency Constant    Aggravating Factors  movement    Pain Relieving Factors ice, pain medications    Effect of Pain on Daily Activities unable to use RUE for any ADLs    Multiple Pain Sites No               [facility] OT Assessment - 12/22/20 0814       Assessment   Medical Diagnosis s/p right arthroscopic RCR    Referring Provider (OT) Dr. Justice Britain    Onset Date/Surgical Date 12/17/20    Hand Dominance Right    Prior Therapy None      Precautions   Precautions Shoulder    Type of Shoulder Precautions P/ROM x4 weeks (12/15-1/12/23); AA/ROM (1/13-1/26); A/ROM (1/27); progress as tolerated    Shoulder Interventions Shoulder sling/immobilizer;Shoulder abduction pillow;Off for dressing/bathing/exercises      Balance Screen   Has the patient fallen in the past 6 months Yes    How many times? 1    Has the patient had a decrease in activity level because of a fear of falling?  No    Is the patient reluctant to leave their home because of a fear of falling?  No      Prior Function   Level of Independence Independent    Vocation Retired    Leisure fishing      ADL   ADL comments Pt is unable to use RUE for any ADLs at this time. Pt is having difficulty sleeping.      Written Expression   Dominant Hand Right      Cognition   Overall Cognitive Status Within Functional Limits for tasks assessed      Observation/Other Assessments   Focus on Therapeutic Outcomes (FOTO)  4/100      ROM / Strength   AROM / PROM / Strength AROM;PROM;Strength      Palpation   Palpation comment mod to max fascial restrictions along right upper arm,  trapezius, and scapular regions      AROM   Overall AROM  Deficits;Unable to assess;Due to precautions      PROM   Overall PROM Comments Assessed supine, er/IR adducted    PROM Assessment Site Shoulder    Right/Left Shoulder Right    Right Shoulder Flexion 75 Degrees    Right Shoulder ABduction 54 Degrees    Right Shoulder  Internal Rotation 90 Degrees    Right Shoulder External Rotation -55 Degrees      Strength   Overall Strength Deficits;Unable to assess;Due to precautions                              OT Education - 12/22/20 0851     Education Details pendulums, elbow AA/ROM, forearm/wrist A/ROM    Person(s) Educated Patient    Methods Explanation;Demonstration;Handout    Comprehension Verbalized understanding;Returned demonstration              OT Short Term Goals - 12/22/20 0951       OT SHORT TERM GOAL #1   Title Pt will be provided with and educated on HEP to improve mobility of dominant RUE required for ADLs.    Time 4    Period Weeks    Status New    Target Date 01/21/21      OT SHORT TERM GOAL #2   Title Pt will increase RUE P/ROM to [facility] to improve mobility required to complete dressing and bathing tasks with minimal compensatory strategies.    Time 4    Period Weeks    Status New      OT SHORT TERM GOAL #3   Title Pt will increase RUE strength to 3/5 to improve ability to reach items at waist to shoulder height during ADL completion.    Time 4    Period Weeks    Status New               OT Long Term Goals - 12/22/20 0959       OT LONG TERM GOAL #1   Title Pt will decrease pain in RUE to 3/10 or less to improve ability to sleep in bed for 2+ consecutive hours without waking due to pain.    Time 8    Period Weeks    Status New    Target Date 02/20/21      OT LONG TERM GOAL #2   Title Pt will increase RUE A/ROM to [facility] to improve ability to reach overhead and behind back during dressing and bathing tasks.    Time 8     Period Weeks    Status New      OT LONG TERM GOAL #3   Title Pt will decrease RUE fascial restrictions to min amounts or less to improve mobility required for functional reaching tasks.    Time 8    Period Weeks    Status New      OT LONG TERM GOAL #4   Title Pt will increase RUE strength to 4+/5 or greater to improve ability to perform lifting tasks during ADL completion.    Time 8    Period Weeks    Status New      OT LONG TERM GOAL #5   Title Pt will return to highest level of functioning using RUE as dominant during functional tasks.    Time 8    Period Weeks    Status New                   Plan - 12/22/20 7673     Clinical Impression Statement A: Pt is a 75 y/o male s/p arthroscopic RCR, DCR, SAD, bursectomy, debridement of degenerative labral tear on 12/17/20, presenting with decreased functional use of dominant RUE. Pt with guarding and limited ability to relax and allow for passive  stretching during evaluation. Educated on HEP for pendulums and elbow/forearm/wrist ROM.    OT Occupational Profile and History Problem Focused Assessment - Including review of records relating to presenting problem    Occupational performance deficits (Please refer to evaluation for details): ADL's;IADL's;Rest and Sleep;Leisure    Body Structure / Function / Physical Skills ADL;Endurance;Muscle spasms;UE functional use;Fascial restriction;Pain;ROM;IADL;Strength    Rehab Potential Good    Clinical Decision Making Limited treatment options, no task modification necessary    Comorbidities Affecting Occupational Performance: None    Modification or Assistance to Complete Evaluation  No modification of tasks or assist necessary to complete eval    OT Frequency 2x / week    OT Duration 8 weeks    OT Treatment/Interventions Self-care/ADL training;Ultrasound;DME and/or AE instruction;Patient/family education;Cryotherapy;Passive range of motion;Electrical Stimulation;Moist Heat;Therapeutic  exercise;Manual Therapy;Therapeutic activities    Plan P: Pt will benefit from skilled OT services to decrease pain and fascial restrictions, increase joint ROM, strength, and functional use of RUE. Treatment plan: myofascial release and manual techniques, P/ROM, AA/ROM, A/ROM, general RUE strengthening, scapular mobility/stability/strengthening, modalities prn    OT Home Exercise Plan eval: pendulums, elbow/forearm/wrist ROM    Consulted and Agree with Plan of Care Patient             Patient will benefit from skilled therapeutic intervention in order to improve the following deficits and impairments:   Body Structure / Function / Physical Skills: ADL, Endurance, Muscle spasms, UE functional use, Fascial restriction, Pain, ROM, IADL, Strength       Visit Diagnosis: Acute pain of right shoulder  Stiffness of right shoulder, not elsewhere classified  Other symptoms and signs involving the musculoskeletal system    Problem List Patient Active Problem List   Diagnosis Date Noted   Anxiety state 05/11/2020   Bilateral plantar fasciitis 05/11/2020   Bladder disorder, unspecified 05/11/2020   Shortness of breath 05/11/2020   Encounter for screening for malignant neoplasm of respiratory organs 05/11/2020   Lung mass 05/11/2020   Malignant neoplasm of unspecified part of left bronchus or lung ([place]) 05/11/2020   Multiple nodules of lung 05/11/2020   Osteoarthrosis, unspecified whether generalized or localized, other specified sites 05/11/2020   Thrombocytopenia, unspecified ([place]) 05/11/2020   Hyperlipidemia 05/11/2020   Pain in shoulder 05/11/2020   Malignant neoplasm of upper lobe of left lung ([place]) 03/12/2019   Malignant melanoma ([place]) 03/12/2019   Bladder polyps 09/21/2018   COPD (chronic obstructive pulmonary disease) ([place]) 03/19/2018   Single kidney 03/22/2016   Primary ureteral papillary carcinoma, left ([place]) 12/04/2015   Overweight (BMI 25.0-29.9) 07/27/2015   Vitamin D  deficiency 02/16/2014   Hypertension associated with diabetes ([place]) 02/14/2014   Tobacco user    Erectile dysfunction    Diabetes mellitus ([place]) 03/22/2010   Hyperlipidemia associated with type 2 diabetes mellitus ([place]) 03/22/2010    Guadelupe Sabin, OTR/L  [phone] 12/22/2020, 10:07 AM  [place], [place], [address] Phone: [phone]   Fax:  [phone]  Name: Dalton Miller MRN: [id number] Date of Birth: 1945/03/09

## 2020-12-24 ENCOUNTER — Ambulatory Visit (HOSPITAL_COMMUNITY): Payer: No Typology Code available for payment source | Admitting: Occupational Therapy

## 2020-12-29 ENCOUNTER — Inpatient Hospital Stay (HOSPITAL_COMMUNITY): Payer: No Typology Code available for payment source

## 2020-12-29 ENCOUNTER — Ambulatory Visit (HOSPITAL_COMMUNITY): Payer: No Typology Code available for payment source | Admitting: Occupational Therapy

## 2020-12-29 ENCOUNTER — Emergency Department (HOSPITAL_COMMUNITY): Payer: No Typology Code available for payment source

## 2020-12-29 ENCOUNTER — Other Ambulatory Visit: Payer: Self-pay

## 2020-12-29 ENCOUNTER — Inpatient Hospital Stay (HOSPITAL_COMMUNITY)
Admission: EM | Admit: 2020-12-29 | Discharge: 2021-01-12 | DRG: 870 | Disposition: A | Discharge status: Rehabilitation Facility | Payer: No Typology Code available for payment source | Attending: Family Medicine | Admitting: Family Medicine

## 2020-12-29 ENCOUNTER — Encounter (HOSPITAL_COMMUNITY): Payer: Self-pay

## 2020-12-29 DIAGNOSIS — I6389 Other cerebral infarction: Secondary | ICD-10-CM | POA: Diagnosis not present

## 2020-12-29 DIAGNOSIS — I959 Hypotension, unspecified: Secondary | ICD-10-CM | POA: Diagnosis present

## 2020-12-29 DIAGNOSIS — R680 Hypothermia, not associated with low environmental temperature: Secondary | ICD-10-CM | POA: Diagnosis present

## 2020-12-29 DIAGNOSIS — R6521 Severe sepsis with septic shock: Secondary | ICD-10-CM | POA: Diagnosis present

## 2020-12-29 DIAGNOSIS — R17 Unspecified jaundice: Secondary | ICD-10-CM | POA: Diagnosis not present

## 2020-12-29 DIAGNOSIS — I69322 Dysarthria following cerebral infarction: Secondary | ICD-10-CM | POA: Diagnosis not present

## 2020-12-29 DIAGNOSIS — K7030 Alcoholic cirrhosis of liver without ascites: Secondary | ICD-10-CM | POA: Diagnosis not present

## 2020-12-29 DIAGNOSIS — Z823 Family history of stroke: Secondary | ICD-10-CM

## 2020-12-29 DIAGNOSIS — I8501 Esophageal varices with bleeding: Secondary | ICD-10-CM | POA: Diagnosis not present

## 2020-12-29 DIAGNOSIS — Z6826 Body mass index (BMI) 26.0-26.9, adult: Secondary | ICD-10-CM

## 2020-12-29 DIAGNOSIS — D75839 Thrombocytosis, unspecified: Secondary | ICD-10-CM | POA: Diagnosis present

## 2020-12-29 DIAGNOSIS — K921 Melena: Secondary | ICD-10-CM | POA: Diagnosis not present

## 2020-12-29 DIAGNOSIS — R590 Localized enlarged lymph nodes: Secondary | ICD-10-CM | POA: Diagnosis present

## 2020-12-29 DIAGNOSIS — Z825 Family history of asthma and other chronic lower respiratory diseases: Secondary | ICD-10-CM

## 2020-12-29 DIAGNOSIS — M1990 Unspecified osteoarthritis, unspecified site: Secondary | ICD-10-CM | POA: Diagnosis present

## 2020-12-29 DIAGNOSIS — D62 Acute posthemorrhagic anemia: Secondary | ICD-10-CM | POA: Diagnosis present

## 2020-12-29 DIAGNOSIS — Z978 Presence of other specified devices: Secondary | ICD-10-CM | POA: Diagnosis not present

## 2020-12-29 DIAGNOSIS — N179 Acute kidney failure, unspecified: Secondary | ICD-10-CM | POA: Diagnosis present

## 2020-12-29 DIAGNOSIS — A419 Sepsis, unspecified organism: Secondary | ICD-10-CM | POA: Diagnosis present

## 2020-12-29 DIAGNOSIS — J432 Centrilobular emphysema: Secondary | ICD-10-CM | POA: Diagnosis present

## 2020-12-29 DIAGNOSIS — Z87442 Personal history of urinary calculi: Secondary | ICD-10-CM

## 2020-12-29 DIAGNOSIS — C649 Malignant neoplasm of unspecified kidney, except renal pelvis: Secondary | ICD-10-CM | POA: Diagnosis present

## 2020-12-29 DIAGNOSIS — E871 Hypo-osmolality and hyponatremia: Secondary | ICD-10-CM | POA: Diagnosis not present

## 2020-12-29 DIAGNOSIS — R6520 Severe sepsis without septic shock: Secondary | ICD-10-CM

## 2020-12-29 DIAGNOSIS — K7031 Alcoholic cirrhosis of liver with ascites: Secondary | ICD-10-CM | POA: Diagnosis not present

## 2020-12-29 DIAGNOSIS — I69354 Hemiplegia and hemiparesis following cerebral infarction affecting left non-dominant side: Secondary | ICD-10-CM | POA: Diagnosis not present

## 2020-12-29 DIAGNOSIS — J9601 Acute respiratory failure with hypoxia: Secondary | ICD-10-CM | POA: Diagnosis present

## 2020-12-29 DIAGNOSIS — D693 Immune thrombocytopenic purpura: Secondary | ICD-10-CM | POA: Diagnosis present

## 2020-12-29 DIAGNOSIS — I440 Atrioventricular block, first degree: Secondary | ICD-10-CM | POA: Diagnosis present

## 2020-12-29 DIAGNOSIS — Z79899 Other long term (current) drug therapy: Secondary | ICD-10-CM

## 2020-12-29 DIAGNOSIS — K7682 Hepatic encephalopathy: Secondary | ICD-10-CM

## 2020-12-29 DIAGNOSIS — E7220 Disorder of urea cycle metabolism, unspecified: Secondary | ICD-10-CM | POA: Diagnosis present

## 2020-12-29 DIAGNOSIS — W19XXXA Unspecified fall, initial encounter: Secondary | ICD-10-CM | POA: Diagnosis present

## 2020-12-29 DIAGNOSIS — R7989 Other specified abnormal findings of blood chemistry: Secondary | ICD-10-CM | POA: Diagnosis present

## 2020-12-29 DIAGNOSIS — M19011 Primary osteoarthritis, right shoulder: Secondary | ICD-10-CM | POA: Diagnosis present

## 2020-12-29 DIAGNOSIS — I639 Cerebral infarction, unspecified: Secondary | ICD-10-CM | POA: Diagnosis present

## 2020-12-29 DIAGNOSIS — I63231 Cerebral infarction due to unspecified occlusion or stenosis of right carotid arteries: Secondary | ICD-10-CM | POA: Diagnosis not present

## 2020-12-29 DIAGNOSIS — Z96651 Presence of right artificial knee joint: Secondary | ICD-10-CM | POA: Diagnosis present

## 2020-12-29 DIAGNOSIS — Z7982 Long term (current) use of aspirin: Secondary | ICD-10-CM

## 2020-12-29 DIAGNOSIS — Z85828 Personal history of other malignant neoplasm of skin: Secondary | ICD-10-CM

## 2020-12-29 DIAGNOSIS — I63511 Cerebral infarction due to unspecified occlusion or stenosis of right middle cerebral artery: Secondary | ICD-10-CM | POA: Diagnosis present

## 2020-12-29 DIAGNOSIS — G928 Other toxic encephalopathy: Secondary | ICD-10-CM | POA: Diagnosis present

## 2020-12-29 DIAGNOSIS — B179 Acute viral hepatitis, unspecified: Secondary | ICD-10-CM | POA: Diagnosis present

## 2020-12-29 DIAGNOSIS — C7800 Secondary malignant neoplasm of unspecified lung: Secondary | ICD-10-CM | POA: Diagnosis present

## 2020-12-29 DIAGNOSIS — I8511 Secondary esophageal varices with bleeding: Secondary | ICD-10-CM | POA: Diagnosis present

## 2020-12-29 DIAGNOSIS — I10 Essential (primary) hypertension: Secondary | ICD-10-CM | POA: Diagnosis present

## 2020-12-29 DIAGNOSIS — D72829 Elevated white blood cell count, unspecified: Secondary | ICD-10-CM | POA: Diagnosis present

## 2020-12-29 DIAGNOSIS — E669 Obesity, unspecified: Secondary | ICD-10-CM | POA: Diagnosis present

## 2020-12-29 DIAGNOSIS — Z905 Acquired absence of kidney: Secondary | ICD-10-CM

## 2020-12-29 DIAGNOSIS — E1165 Type 2 diabetes mellitus with hyperglycemia: Secondary | ICD-10-CM | POA: Diagnosis present

## 2020-12-29 DIAGNOSIS — R609 Edema, unspecified: Secondary | ICD-10-CM | POA: Diagnosis not present

## 2020-12-29 DIAGNOSIS — E785 Hyperlipidemia, unspecified: Secondary | ICD-10-CM | POA: Diagnosis present

## 2020-12-29 DIAGNOSIS — K920 Hematemesis: Secondary | ICD-10-CM

## 2020-12-29 DIAGNOSIS — U071 COVID-19: Secondary | ICD-10-CM | POA: Diagnosis present

## 2020-12-29 DIAGNOSIS — E870 Hyperosmolality and hypernatremia: Secondary | ICD-10-CM | POA: Diagnosis not present

## 2020-12-29 DIAGNOSIS — Z6830 Body mass index (BMI) 30.0-30.9, adult: Secondary | ICD-10-CM

## 2020-12-29 DIAGNOSIS — C439 Malignant melanoma of skin, unspecified: Secondary | ICD-10-CM | POA: Diagnosis not present

## 2020-12-29 DIAGNOSIS — E119 Type 2 diabetes mellitus without complications: Secondary | ICD-10-CM | POA: Diagnosis not present

## 2020-12-29 DIAGNOSIS — T68XXXA Hypothermia, initial encounter: Secondary | ICD-10-CM | POA: Diagnosis not present

## 2020-12-29 DIAGNOSIS — I6781 Acute cerebrovascular insufficiency: Secondary | ICD-10-CM | POA: Diagnosis not present

## 2020-12-29 DIAGNOSIS — E440 Moderate protein-calorie malnutrition: Secondary | ICD-10-CM | POA: Diagnosis present

## 2020-12-29 DIAGNOSIS — N529 Male erectile dysfunction, unspecified: Secondary | ICD-10-CM | POA: Diagnosis present

## 2020-12-29 DIAGNOSIS — Z4659 Encounter for fitting and adjustment of other gastrointestinal appliance and device: Secondary | ICD-10-CM

## 2020-12-29 DIAGNOSIS — K766 Portal hypertension: Secondary | ICD-10-CM | POA: Diagnosis present

## 2020-12-29 DIAGNOSIS — Z833 Family history of diabetes mellitus: Secondary | ICD-10-CM

## 2020-12-29 DIAGNOSIS — Z85528 Personal history of other malignant neoplasm of kidney: Secondary | ICD-10-CM

## 2020-12-29 DIAGNOSIS — Z9289 Personal history of other medical treatment: Secondary | ICD-10-CM

## 2020-12-29 DIAGNOSIS — F17290 Nicotine dependence, other tobacco product, uncomplicated: Secondary | ICD-10-CM | POA: Diagnosis present

## 2020-12-29 DIAGNOSIS — Z8249 Family history of ischemic heart disease and other diseases of the circulatory system: Secondary | ICD-10-CM

## 2020-12-29 DIAGNOSIS — K922 Gastrointestinal hemorrhage, unspecified: Secondary | ICD-10-CM

## 2020-12-29 DIAGNOSIS — M7989 Other specified soft tissue disorders: Secondary | ICD-10-CM | POA: Diagnosis not present

## 2020-12-29 DIAGNOSIS — J9600 Acute respiratory failure, unspecified whether with hypoxia or hypercapnia: Secondary | ICD-10-CM

## 2020-12-29 LAB — BLOOD GAS, ARTERIAL
Acid-base deficit: 21.2 mmol/L — ABNORMAL HIGH (ref 0.0–2.0)
Acid-base deficit: 8.4 mmol/L — ABNORMAL HIGH (ref 0.0–2.0)
Bicarbonate: 8.3 mmol/L — ABNORMAL LOW (ref 20.0–28.0)
Bicarbonate: 17.9 mmol/L — ABNORMAL LOW (ref 20.0–28.0)
Drawn by: 41977
Drawn by: 35043
FIO2: 100
FIO2: 60
O2 Saturation: 98.8 %
O2 Saturation: 99.3 %
Patient temperature: 30.2
Patient temperature: 36.7
pCO2 arterial: 25.7 mmHg — ABNORMAL LOW (ref 32.0–48.0)
pCO2 arterial: 28.7 mmHg — ABNORMAL LOW (ref 32.0–48.0)
pH, Arterial: 7.061 — CL (ref 7.350–7.450)
pH, Arterial: 7.362 (ref 7.350–7.450)
pO2, Arterial: 484 mmHg — ABNORMAL HIGH (ref 83.0–108.0)
pO2, Arterial: 263 mmHg — ABNORMAL HIGH (ref 83.0–108.0)

## 2020-12-29 LAB — COMPREHENSIVE METABOLIC PANEL
ALT: 229 U/L — ABNORMAL HIGH (ref 0–44)
AST: 324 U/L — ABNORMAL HIGH (ref 15–41)
Albumin: 2.6 g/dL — ABNORMAL LOW (ref 3.5–5.0)
Alkaline Phosphatase: 261 U/L — ABNORMAL HIGH (ref 38–126)
Anion gap: 24 — ABNORMAL HIGH (ref 5–15)
BUN: 95 mg/dL — ABNORMAL HIGH (ref 8–23)
CO2: 9 mmol/L — ABNORMAL LOW (ref 22–32)
Calcium: 8.6 mg/dL — ABNORMAL LOW (ref 8.9–10.3)
Chloride: 108 mmol/L (ref 98–111)
Creatinine, Ser: 1.77 mg/dL — ABNORMAL HIGH (ref 0.61–1.24)
GFR, Estimated: 40 mL/min — ABNORMAL LOW (ref >60)
Glucose, Bld: 284 mg/dL — ABNORMAL HIGH (ref 70–99)
Potassium: 4.8 mmol/L (ref 3.5–5.1)
Sodium: 141 mmol/L (ref 135–145)
Total Bilirubin: 3.6 mg/dL — ABNORMAL HIGH (ref 0.3–1.2)
Total Protein: 5.3 g/dL — ABNORMAL LOW (ref 6.5–8.1)

## 2020-12-29 LAB — CBC WITH DIFFERENTIAL/PLATELET
Band Neutrophils: 4 %
Basophils Absolute: 0 10*3/uL (ref 0.0–0.1)
Basophils Relative: 0 %
Eosinophils Absolute: 0 10*3/uL (ref 0.0–0.5)
Eosinophils Relative: 0 %
HCT: 23.2 % — ABNORMAL LOW (ref 39.0–52.0)
Hemoglobin: 7.1 g/dL — ABNORMAL LOW (ref 13.0–17.0)
Lymphocytes Relative: 18 %
Lymphs Abs: 7.3 10*3/uL — ABNORMAL HIGH (ref 0.7–4.0)
MCH: 32.9 pg (ref 26.0–34.0)
MCHC: 30.6 g/dL (ref 30.0–36.0)
MCV: 107.4 fL — ABNORMAL HIGH (ref 80.0–100.0)
Metamyelocytes Relative: 5 %
Monocytes Absolute: 3.3 10*3/uL — ABNORMAL HIGH (ref 0.1–1.0)
Monocytes Relative: 8 %
Myelocytes: 1 %
Neutro Abs: 26.9 10*3/uL — ABNORMAL HIGH (ref 1.7–7.7)
Neutrophils Relative %: 62 %
Platelets: 539 10*3/uL — ABNORMAL HIGH (ref 150–400)
Promyelocytes Relative: 2 %
RBC: 2.16 MIL/uL — ABNORMAL LOW (ref 4.22–5.81)
RDW: 16.7 % — ABNORMAL HIGH (ref 11.5–15.5)
WBC: 40.7 10*3/uL — ABNORMAL HIGH (ref 4.0–10.5)
nRBC: 0.5 % — ABNORMAL HIGH (ref 0.0–0.2)

## 2020-12-29 LAB — URINALYSIS, ROUTINE W REFLEX MICROSCOPIC
Bilirubin Urine: NEGATIVE
Glucose, UA: 100 mg/dL — AB
Hgb urine dipstick: NEGATIVE
Ketones, ur: NEGATIVE mg/dL
Leukocytes,Ua: NEGATIVE
Nitrite: NEGATIVE
Protein, ur: NEGATIVE mg/dL
Specific Gravity, Urine: 1.015 (ref 1.005–1.030)
pH: 5.5 (ref 5.0–8.0)

## 2020-12-29 LAB — POCT I-STAT 7, (LYTES, BLD GAS, ICA,H+H)
Acid-base deficit: 7 mmol/L — ABNORMAL HIGH (ref 0.0–2.0)
Bicarbonate: 18.3 mmol/L — ABNORMAL LOW (ref 20.0–28.0)
Calcium, Ion: 0.99 mmol/L — ABNORMAL LOW (ref 1.15–1.40)
HCT: 28 % — ABNORMAL LOW (ref 39.0–52.0)
Hemoglobin: 9.5 g/dL — ABNORMAL LOW (ref 13.0–17.0)
O2 Saturation: 71 %
Patient temperature: 98.3
Potassium: 5 mmol/L (ref 3.5–5.1)
Sodium: 137 mmol/L (ref 135–145)
TCO2: 19 mmol/L — ABNORMAL LOW (ref 22–32)
pCO2 arterial: 34.8 mmHg (ref 32.0–48.0)
pH, Arterial: 7.327 — ABNORMAL LOW (ref 7.350–7.450)
pO2, Arterial: 39 mmHg — CL (ref 83.0–108.0)

## 2020-12-29 LAB — RESP PANEL BY RT-PCR (FLU A&B, COVID) ARPGX2
Influenza A by PCR: NEGATIVE
Influenza B by PCR: NEGATIVE
SARS Coronavirus 2 by RT PCR: POSITIVE — AB

## 2020-12-29 LAB — LACTIC ACID, PLASMA
Lactic Acid, Venous: >9 mmol/L (ref 0.5–1.9)
Lactic Acid, Venous: >9 mmol/L (ref 0.5–1.9)

## 2020-12-29 LAB — BLOOD GAS, VENOUS
Acid-base deficit: 22.7 mmol/L — ABNORMAL HIGH (ref 0.0–2.0)
Bicarbonate: 7 mmol/L — ABNORMAL LOW (ref 20.0–28.0)
FIO2: 100
O2 Saturation: 69.6 %
Patient temperature: 30
pCO2, Ven: 35.8 mmHg — ABNORMAL LOW (ref 44.0–60.0)
pH, Ven: 6.92 — CL (ref 7.250–7.430)
pO2, Ven: 45.9 mmHg — ABNORMAL HIGH (ref 32.0–45.0)

## 2020-12-29 LAB — OCCULT BLOOD GASTRIC / DUODENUM (SPECIMEN CUP)
Occult Blood, Gastric: POSITIVE — AB
pH, Gastric: 4

## 2020-12-29 LAB — AMMONIA: Ammonia: 115 umol/L — ABNORMAL HIGH (ref 9–35)

## 2020-12-29 LAB — PREPARE RBC (CROSSMATCH)

## 2020-12-29 LAB — ETHANOL: Alcohol, Ethyl (B): <10 mg/dL (ref <10)

## 2020-12-29 MED ORDER — SODIUM CHLORIDE 0.9 % IV SOLN
10.0000 mL/h | Freq: Once | INTRAVENOUS | Status: DC
Start: 2020-12-29 — End: 2021-01-03

## 2020-12-29 MED ORDER — MIDAZOLAM HCL 2 MG/2ML IJ SOLN
1.0000 mg | INTRAMUSCULAR | Status: DC | PRN
  Filled 2020-12-29: qty 2

## 2020-12-29 MED ORDER — CHLORHEXIDINE GLUCONATE CLOTH 2 % EX PADS
6.0000 | MEDICATED_PAD | Freq: Every day | CUTANEOUS | Status: DC
  Administered 2020-12-29 – 2021-01-11 (×14): 6 via TOPICAL

## 2020-12-29 MED ORDER — ROCURONIUM BROMIDE 10 MG/ML (PF) SYRINGE
PREFILLED_SYRINGE | INTRAVENOUS | Status: AC
  Filled 2020-12-29: qty 10

## 2020-12-29 MED ORDER — ETOMIDATE 2 MG/ML IV SOLN
INTRAVENOUS | Status: AC
  Filled 2020-12-29: qty 10

## 2020-12-29 MED ORDER — ETOMIDATE 2 MG/ML IV SOLN
INTRAVENOUS | Status: AC | PRN
Start: 2020-12-29 — End: 2020-12-29
  Administered 2020-12-29: 20 mg via INTRAVENOUS

## 2020-12-29 MED ORDER — ROCURONIUM BROMIDE 50 MG/5ML IV SOLN
INTRAVENOUS | Status: AC | PRN
  Administered 2020-12-29: 75 mg via INTRAVENOUS

## 2020-12-29 MED ORDER — ORAL CARE MOUTH RINSE
15.0000 mL | OROMUCOSAL | Status: DC
  Administered 2020-12-30 – 2021-01-05 (×64): 15 mL via OROMUCOSAL

## 2020-12-29 MED ORDER — SODIUM BICARBONATE 8.4 % IV SOLN
INTRAVENOUS | Status: DC
  Filled 2020-12-29 (×4): qty 1000

## 2020-12-29 MED ORDER — VANCOMYCIN HCL 750 MG/150ML IV SOLN
750.0000 mg | INTRAVENOUS | Status: DC
  Filled 2020-12-29: qty 150

## 2020-12-29 MED ORDER — VANCOMYCIN HCL 1750 MG/350ML IV SOLN
1750.0000 mg | Freq: Once | INTRAVENOUS | Status: AC
  Administered 2020-12-29: 12:00:00 1750 mg via INTRAVENOUS
  Filled 2020-12-29: qty 350

## 2020-12-29 MED ORDER — PIPERACILLIN-TAZOBACTAM 3.375 G IVPB
3.3750 g | Freq: Three times a day (TID) | INTRAVENOUS | Status: DC
  Administered 2020-12-29 – 2020-12-30 (×2): 3.375 g via INTRAVENOUS
  Filled 2020-12-29 (×2): qty 50

## 2020-12-29 MED ORDER — PROPOFOL 1000 MG/100ML IV EMUL
0.0000 ug/kg/min | INTRAVENOUS | Status: DC
  Administered 2020-12-29: 23:00:00 30 ug/kg/min via INTRAVENOUS
  Administered 2020-12-30 (×2): 20 ug/kg/min via INTRAVENOUS
  Administered 2020-12-31: 13:00:00 25 ug/kg/min via INTRAVENOUS
  Administered 2020-12-31 (×2): 20 ug/kg/min via INTRAVENOUS
  Administered 2021-01-01 – 2021-01-02 (×2): 15 ug/kg/min via INTRAVENOUS
  Filled 2020-12-29 (×7): qty 100

## 2020-12-29 MED ORDER — FENTANYL CITRATE PF 50 MCG/ML IJ SOSY: 50.0000 ug | PREFILLED_SYRINGE | INTRAMUSCULAR | Status: DC | PRN

## 2020-12-29 MED ORDER — SODIUM CHLORIDE 0.9 % IV SOLN
INTRAVENOUS | Status: AC | PRN
  Administered 2020-12-29: 1000 mL via INTRAVENOUS

## 2020-12-29 MED ORDER — PROPOFOL 1000 MG/100ML IV EMUL
INTRAVENOUS | Status: AC
  Filled 2020-12-29: qty 100

## 2020-12-29 MED ORDER — PANTOPRAZOLE SODIUM 40 MG IV SOLR
80.0000 mg | Freq: Once | INTRAVENOUS | Status: AC
  Administered 2020-12-29: 11:00:00 80 mg via INTRAVENOUS
  Filled 2020-12-29: qty 80

## 2020-12-29 MED ORDER — FENTANYL 2500MCG IN NS 250ML (10MCG/ML) PREMIX INFUSION
25.0000 ug/h | INTRAVENOUS | Status: DC
  Administered 2020-12-30 – 2021-01-01 (×2): 25 ug/h via INTRAVENOUS
  Filled 2020-12-29 (×3): qty 250

## 2020-12-29 MED ORDER — NALOXONE HCL 2 MG/2ML IJ SOSY
PREFILLED_SYRINGE | INTRAMUSCULAR | Status: AC
  Administered 2020-12-29: 09:00:00 2 mg
  Filled 2020-12-29: qty 2

## 2020-12-29 MED ORDER — MIDAZOLAM HCL 2 MG/2ML IJ SOLN
1.0000 mg | INTRAMUSCULAR | Status: DC | PRN
  Administered 2020-12-29 (×2): 1 mg via INTRAVENOUS
  Filled 2020-12-29 (×2): qty 2

## 2020-12-29 MED ORDER — CHLORHEXIDINE GLUCONATE 0.12% ORAL RINSE (MEDLINE KIT)
15.0000 mL | Freq: Two times a day (BID) | OROMUCOSAL | Status: DC
  Administered 2020-12-29 – 2021-01-06 (×16): 15 mL via OROMUCOSAL

## 2020-12-29 MED ORDER — FENTANYL CITRATE PF 50 MCG/ML IJ SOSY: 25.0000 ug | PREFILLED_SYRINGE | Freq: Once | INTRAMUSCULAR | Status: DC

## 2020-12-29 MED ORDER — FENTANYL BOLUS VIA INFUSION
25.0000 ug | INTRAVENOUS | Status: DC | PRN
  Filled 2020-12-29: qty 100

## 2020-12-29 MED ORDER — SODIUM CHLORIDE 0.9 % IV BOLUS
500.0000 mL | Freq: Once | INTRAVENOUS | Status: AC
  Administered 2020-12-29: 10:00:00 500 mL via INTRAVENOUS

## 2020-12-29 MED ORDER — PIPERACILLIN-TAZOBACTAM 3.375 G IVPB 30 MIN
3.3750 g | Freq: Once | INTRAVENOUS | Status: AC
  Administered 2020-12-29: 12:00:00 3.375 g via INTRAVENOUS
  Filled 2020-12-29: qty 50

## 2020-12-29 MED ORDER — PANTOPRAZOLE SODIUM 40 MG IV SOLR
40.0000 mg | Freq: Two times a day (BID) | INTRAVENOUS | Status: DC
  Administered 2020-12-30 – 2021-01-05 (×14): 40 mg via INTRAVENOUS
  Filled 2020-12-29 (×14): qty 40

## 2020-12-29 MED ORDER — FENTANYL CITRATE PF 50 MCG/ML IJ SOSY
50.0000 ug | PREFILLED_SYRINGE | INTRAMUSCULAR | Status: DC | PRN
  Administered 2020-12-29 (×2): 50 ug via INTRAVENOUS
  Filled 2020-12-29 (×2): qty 1

## 2020-12-29 MED ORDER — SODIUM BICARBONATE 8.4 % IV SOLN
INTRAVENOUS | Status: AC
  Filled 2020-12-29: qty 150

## 2020-12-29 MED ORDER — LACTULOSE 10 GM/15ML PO SOLN
20.0000 g | Freq: Three times a day (TID) | ORAL | Status: DC
Start: 2020-12-30 — End: 2021-01-01
  Administered 2020-12-30 (×2): 20 g
  Filled 2020-12-29 (×2): qty 30

## 2020-12-29 NOTE — H&P (Signed)
NAME:  Dalton Miller, MRN:  [id number], DOB:  [birth year], LOS: 0 ADMISSION DATE:  12/29/2020, CONSULTATION DATE:  12/29/20 REFERRING MD:  Eulis Foster - [facility], CHIEF COMPLAINT:  CVA and GIB    History of Present Illness:   75 yo with complex PMH including metastatic melanoma, lung cancer, urothelial carcinoma, ongoing tobacco use disorder,  thrombocytopenia (felt to be immunotherapy related ITP), recent R shoulder surgery for impingement + OA (12/17/20), presented to [facility] 12/27 with AMS after possible fall  Pt LKW 12/26 around 2100. Found 12/27 around 0600, unresponsive beside toilet. It is not known how long the patient was downed. He was reportedly pale and yellow appearing. Taken to [facility] where he was noted to be hypoxic SpO2 70s, hypotensive 74/50. Family states that 12/26 pt complained of upper GI pain and had a black stool.   Pt was obtunded in ED and required intubation. CT H obtained and revealed acute R MCA CVA. OGT place and promptly yielded >500 ml coffee ground emesis. His hgb is 7.1 in the ED -- received 1 PRBC. His COVID test has also resulted positive.   PCCM consulted for admission in this setting   Notable labs on admission  CO2 9 Cr 1.77 BUN 95 AG 24 Glu 284 Alk phos 261 AST 324 ALT 229 Ammonia 115 Tbili 3.6 LA > 9  WBC 40.7 Hgb 7.1 plt 539  6.9 / 35.8/ 45.9    Pertinent  Medical History  Metastatic melanoma , left lung  Recurrent urothelial cancer DM2 HTN HLD Emphysema  S/p nephrectomy   Significant Hospital Events: Including procedures, antibiotic start and stop dates in addition to other pertinent events   12/27 [facility] for AMS. Obtunded, intubated. R MCA stroke, GIB, AKI, Acute liver injury. Transferring to [facility]   Interim History / Subjective:    Objective   Blood pressure 138/72, pulse 93, temperature (!) 89.1 F (31.7 C), resp. rate (!) 22, SpO2 100 %.    Vent Mode: PRVC FiO2 (%):  [100 %] 100 % Set Rate:  [22 bmp] 22 bmp Vt Set:  [530 mL] 530 mL PEEP:  [5  cmH20] 5 cmH20 Plateau Pressure:  [16 cmH20] 16 cmH20   Intake/Output Summary (Last 24 hours) at 12/29/2020 1414 Last data filed at 12/29/2020 1347 Gross per 24 hour  Intake 1450 ml  Output --  Net 1450 ml   There were no vitals filed for this visit.  Examination: General: elderly male on vent. Intubated. Jaundiced HENT: [place]/AT, scleral icterus and mild edema. ETT Lungs: Bilateral wheeze.  Cardiovascular: RRR, no MRG Abdomen: Soft, non-distended. Black tarry stool.  Extremities: no acute deformity. No clubbing. No cyanosis. No edema.  Neuro: Breathing over ventilator. Dolls intact. Cough/gag intact. Moving all 4 extremities spontaneously, left sided weakness. Babinski equivocal. Facial symmetry  GU: foley, dark urine.  CT head reviewed by me: right basal ganglia and frontal lobe hypodensity concerning for MCA territory infarct   Resolved Hospital Problem list     Assessment & Plan:   Acute R MCA territory infarct  -involving R basal ganglia, R frontal lobe  -notably, is typically thrombocytopenic but on admission plt are >500  P -Neurology to see -ECHO  Acute metabolic encephalopathy: multifactorial  -hyperammonemia (115) , acute liver injury, AKI, sepsis, stroke, hypoxia  P -metabolic correction as below  -RASS goal 0 to -1  -lactulose -propofol/fentanyl for RASS goal -1 to -2  Acute respiratory failure with hypoxia -POA COVID-19 infection -POA P - Full vent support -  CXR, ABG - VAP bundle - Daily SAT/WUA - Clear CXR, hold COVID therapeutics   GIB -POA ABLA -POA P -Hgb goal > 7. 2 units transfused 12/27 -transfuse PRN  -NPO -BID PPI   Suspected severe sepsis -POA. Source unclear.  -WBC 40.7. Possible component of hemoconcentration contributing but picture is c/w severe sepsis  P -broad abx -follow cx data - send PCT -Await MRSA swab, can hopefully DC vanco.   AKI -POA Solitary kidney -POA P -check CK  -trend UOP, renal indices   Acute liver  injury - POA Hyperammonemia -POA -acutely elevated LFTs. Ammonia 115 P -check coags -trend LFTs, ammonia  -acute hepatitis panel -RUQ [place]   AGMA, severe - POA > improved Lactic Acidosis -POA  -reflective of above processes  P -Continue bicarb drip for now. LA will be slow to clear with liver failure.  -recheck CMP, LA   Hypothermia -environmental, down in bathroom for unknown period of time, up to 7 hrs  P -bair hugger   Acute thrombocytosis (? 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  P -hold outpt promacta (46m qD)   Metastatic recurrent melanoma - POA -L upper lung lesion, mediastinal adenopathy  -s/p 4 cycles ipi/nivo, maintenance nivo dc due to ITP P -no acute intervention  Recurrent low grade urothelial carcinoma s/p TURBT, repeat TURBT x2 - POA -no acute intervention    Best Practice (right click and "Reselect all SmartList Selections" daily)   Diet/type: NPO DVT prophylaxis: SCD GI prophylaxis: PPI Lines: N/A Foley:  Yes, and it is still needed Code Status:  full code Last date of multidisciplinary goals of care discussion [[place] code status, Full code]  Labs   CBC: Recent Labs  Lab 12/29/20 0908  WBC 40.7*  NEUTROABS 26.9*  HGB 7.1*  HCT 23.2*  MCV 107.4*  PLT 539*    Basic Metabolic Panel: Recent Labs  Lab 12/29/20 0908  NA 141  K 4.8  CL 108  CO2 9*  GLUCOSE 284*  BUN 95*  CREATININE 1.77*  CALCIUM 8.6*   GFR: Estimated Creatinine Clearance: 34.2 mL/min (A) (by C-G formula based on SCr of 1.77 mg/dL (H)). Recent Labs  Lab 12/29/20 0908 12/29/20 1101  WBC 40.7*  --   LATICACIDVEN >9.0* >9.0*    Liver Function Tests: Recent Labs  Lab 12/29/20 0908  AST 324*  ALT 229*  ALKPHOS 261*  BILITOT 3.6*  PROT 5.3*  ALBUMIN 2.6*   No results for input(s): LIPASE, AMYLASE in the last  168 hours. Recent Labs  Lab 12/29/20 0908  AMMONIA 115*    ABG    Component Value Date/Time   PHART 7.061 (LL) 12/29/2020 1217   PCO2ART 25.7 (L) 12/29/2020 1217   PO2ART 484 (H) 12/29/2020 1217   HCO3 8.3 (L) 12/29/2020 1217   TCO2 24 09/17/2015 1117   ACIDBASEDEF 21.2 (H) 12/29/2020 1217   O2SAT 98.8 12/29/2020 1217     Coagulation Profile: No results for input(s): INR, PROTIME in the last 168 hours.  Cardiac Enzymes: No results for input(s): CKTOTAL, CKMB, CKMBINDEX, TROPONINI in the last 168 hours.  HbA1C: HB A1C (BAYER DCA - WAIVED)  Date/Time Value Ref Range Status  05/20/2019 12:24 PM 7.0 (H) <7.0 % Final    Comment:  Diabetic Adult            <7.0                                       Healthy Adult        4.3 - 5.7                                                           (DCCT/NGSP) American Diabetes Association's Summary of Glycemic Recommendations for Adults with Diabetes: Hemoglobin A1c <7.0%. More stringent glycemic goals (A1c <6.0%) may further reduce complications at the cost of increased risk of hypoglycemia.   03/19/2018 08:36 AM 6.1 <7.0 % Final    Comment:                                          Diabetic Adult            <7.0                                       Healthy Adult        4.3 - 5.7                                                           (DCCT/NGSP) American Diabetes Association's Summary of Glycemic Recommendations for Adults with Diabetes: Hemoglobin A1c <7.0%. More stringent glycemic goals (A1c <6.0%) may further reduce complications at the cost of increased risk of hypoglycemia.    Hgb A1c MFr Bld  Date/Time Value Ref Range Status  12/14/2020 09:20 AM 7.6 (H) 4.8 - 5.6 % Final    Comment:    (NOTE) Pre diabetes:          5.7%-6.4%  Diabetes:              >6.4%  Glycemic control for   <7.0% adults with diabetes     CBG: No results for input(s): GLUCAP in the last 168  hours.  Review of Systems:   Unable to obtain, intubated   Past Medical History:  He,  has a past medical history of Acquired renal cyst of right kidney, Arthritis, Cancer ([place]), Erectile dysfunction, Heart murmur, Hematuria, History of cellulitis, History of kidney stones, Hyperlipidemia, Hypertension, Metastatic melanoma to lung, left ([place]), Nephrolithiasis, Type 2 diabetes mellitus ([place]), and Wears glasses.   Surgical History:   Past Surgical History:  Procedure Laterality Date   CARPAL TUNNEL RELEASE Right 1990  approx   COLONOSCOPY  03/04/2010   CYSTOSCOPY W/ RETROGRADES Left 10/28/2015   Procedure: CYSTOSCOPY WITH LEFT RETROGRADE PYELOGRAM;  Surgeon: Cleon Gustin, MD;  Location: [facility];  Service: Urology;  Laterality: Left;   CYSTOSCOPY W/ URETERAL STENT PLACEMENT Left 10/28/2015   Procedure: CYSTOSCOPY WITH LEFT URETERAL STENT EXCHANGE;  Surgeon: Cleon Gustin, MD;  Location: [facility];  Service:  Urology;  Laterality: Left;   CYSTOSCOPY WITH RETROGRADE PYELOGRAM, URETEROSCOPY AND STENT PLACEMENT Bilateral 09/17/2015   Procedure: CYSTOSCOPY WITH BILATERAL RETROGRADE PYELOGRAM, LEFT URETEROSCOPY WITH URETERAL BIOPSY AND  STENT PLACEMENT;  Surgeon: Irine Seal, MD;  [facility];  Service: Urology;  Laterality: Bilateral;   HOLMIUM LASER APPLICATION Left 53/64/6803   Procedure: HOLMIUM LASER APPLICATION;  Surgeon: Irine Seal, MD;  [facility];  Service: Urology;  Laterality: Left;   KNEE ARTHROSCOPY Right 2008   MELANOMA EXCISION  2018   Left arm   right total knee replacement  Right    2009   ROBOT ASSITED LAPAROSCOPIC NEPHROURETERECTOMY Left 12/04/2015   Procedure: XI ROBOT ASSITED LAPAROSCOPIC NEPHROURETERECTOMY;  Surgeon: Cleon Gustin, MD;  Location: [facility];  Service: Urology;  Laterality: Left;   SHOULDER ARTHROSCOPY Right 12/17/2020   Procedure: Right shoulder arthroscopy, debridement, subacromial decompression,  distal clavicle resection;  Surgeon: Justice Britain, MD;  Location: [facility];  Service: Orthopedics;  Laterality: Right;  34mn   STONE EXTRACTION WITH BASKET Left 09/17/2015   Procedure: STONE EXTRACTION WITH BASKET;  Surgeon: JIrine Seal MD;  [facility]  Service: Urology;  Laterality: Left;   TOTAL KNEE ARTHROPLASTY Right 2009   URETEROSCOPY  10/28/2015   Procedure: DIAGNOSTIC LEFT URETEROSCOPY;  Surgeon: PCleon Gustin MD;  Location: [facility];  Service: Urology;;   WISDOM TOOTH EXTRACTION       Social History:   reports that he has been smoking cigars and cigarettes. He has a 94.50 pack-year smoking history. He quit smokeless tobacco use about 28 years ago.  His smokeless tobacco use included chew. He reports that he does not drink alcohol and does not use drugs.   Family History:  His family history includes CAD in his mother; COPD in his son; Cancer in his brother; Congestive Heart Failure in his father; Diabetes in his father and mother; Emphysema in his son; Heart attack in his brother; Heart disease in his mother and son; Stroke in his mother.   Allergies No Known Allergies   Home Medications  Prior to Admission medications   Medication Sig Start Date End Date Taking? Authorizing Provider  amLODipine (NORVASC) 2.5 MG tablet Take 2.5 mg by mouth daily. 03/04/20  Yes [provider]  carboxymethylcellulose (REFRESH PLUS) 0.5 % SOLN Place 1 drop into both eyes in the morning, at noon, and at bedtime. 04/24/20  Yes [provider]  cyclobenzaprine (FLEXERIL) 10 MG tablet Take 1 tablet (10 mg total) by mouth 3 (three) times daily as needed for muscle spasms. 12/17/20  Yes Shuford, TOlivia Mackie PA-C  eltrombopag (PROMACTA) 25 MG tablet Take 25 mg by mouth daily. 04/08/20  Yes [provider]  loratadine (CLARITIN) 10 MG tablet Take 10 mg by mouth daily.   Yes [provider]  naproxen (NAPROSYN) 500 MG tablet Take 1 tablet (500 mg total)  by mouth 2 (two) times daily with a meal. 12/17/20  Yes Shuford, TOlivia Mackie PA-C  oxyCODONE (OXY IR/ROXICODONE) 5 MG immediate release tablet Take 5 mg by mouth every 6 (six) hours as needed. 12/24/20  Yes [provider]  pregabalin (LYRICA) 75 MG capsule Take 75 mg by mouth 2 (two) times daily. 02/28/20  Yes [provider]  WJay SchlichterOil (REFRESH P.M. OP) Place 1 application into both eyes at bedtime.   Yes [provider]  aspirin EC 81 MG tablet Take 1 tablet (81 mg total) by mouth daily. Patient  not taking: Reported on 12/09/2020 03/22/16   Evelina Dun A, FNP  atorvastatin (LIPITOR) 10 MG tablet Take 1 tablet (10 mg total) by mouth daily. Patient not taking: Reported on 12/09/2020 05/20/19   Sharion Balloon, FNP  fluticasone [facility]) 50 MCG/ACT nasal spray SPRAY 2 SPRAYS INTO EACH NOSTRIL EVERY DAY Patient not taking: Reported on 12/09/2020 05/20/19   Evelina Dun A, FNP  lisinopril (ZESTRIL) 10 MG tablet TAKE 1 TABLET (10 MG TOTAL) BY MOUTH DAILY. NEEDS TO BE SEEN BEFORE NEXT REFILL. Patient not taking: Reported on 12/09/2020 06/10/19   Evelina Dun A, FNP  ondansetron (ZOFRAN) 4 MG tablet Take 1 tablet (4 mg total) by mouth every 8 (eight) hours as needed for nausea or vomiting. Patient not taking: Reported on 12/29/2020 12/17/20   Shuford, Olivia Mackie, PA-C  oxyCODONE-acetaminophen (PERCOCET) 5-325 MG tablet Take 1 tablet by mouth every 4 (four) hours as needed (max 6 q). Patient not taking: Reported on 12/29/2020 12/17/20   Jenetta Loges, PA-C     Critical care time: 52 minutes.      Georgann Housekeeper, AGACNP-BC [facility]  See Amion for personal pager PCCM on call pager [phone] until 7pm. Please call Elink 7p-7a. [phone]  12/29/2020 11:07 PM

## 2020-12-29 NOTE — ED Notes (Signed)
Family at bedside. 

## 2020-12-29 NOTE — Consult Note (Signed)
NEURO HOSPITALIST CONSULT NOTE   Requestig physician: Dr. Carson Myrtle  Reason for Consult: Right MCA strokes  History obtained from:   Chart     HPI:                                                                                                                                          Dalton Miller is an 75 y.o. male with an extensive PMHx as documented below who presented to the [facility] ED on Tuesday with AMS after a fall at home. He was found unresponsive by his bedside toilet. He was last seen normal by family before bedtime on Monday night. The patient had been going through rehab after recent shoulder surgery. Family had noted recent black tarry stools, as well as patient complaining of abdominal pain x 1 day. At his post-op baseline, he was able to ambulate around the house, feed and dress himself.   On arrival to the ED he was jaundiced and demonstrated dysconjugate gaze. He was hypoxic on room air and was subsequently intubated in the ED. [facility] ED Triage note was reviewed: "LKW 2100 last night patient got up around 0600 to go to bathroom and pta found patient unresponsive bedside toilet.  Patient is pale and yellow.  Just had surgery on right arm.  Patient initial sats 75% and BP 74/50 HR 80.:"  In the ED, he was started on empiric antibiotics for possible infection. CT head revealed findings compatible with an acute right MCA stroke. He was then transferred to [facility] for further evaluation and management. He was found to be COVID-positive at [facility].     Past Medical History:  Diagnosis Date   Acquired renal cyst of right kidney    Arthritis    Cancer ([place])    renal cell    Erectile dysfunction    Heart murmur    CHILDHOOD   Hematuria    left ureteral bleeding   History of cellulitis    2012 left wrist   History of kidney stones    Hyperlipidemia    Hypertension    Metastatic melanoma to lung, left ([place])    Nephrolithiasis    bilateral non-obstructive per  ct 07-02-2015   Type 2 diabetes mellitus ([place])    Wears glasses     Past Surgical History:  Procedure Laterality Date   CARPAL TUNNEL RELEASE Right 1990  approx   COLONOSCOPY  03/04/2010   CYSTOSCOPY W/ RETROGRADES Left 10/28/2015   Procedure: CYSTOSCOPY WITH LEFT RETROGRADE PYELOGRAM;  Surgeon: Cleon Gustin, MD;  Location: [facility];  Service: Urology;  Laterality: Left;   CYSTOSCOPY W/ URETERAL STENT PLACEMENT Left 10/28/2015   Procedure: CYSTOSCOPY WITH LEFT URETERAL STENT EXCHANGE;  Surgeon: Cleon Gustin, MD;  Location: [facility];  Service: Urology;  Laterality: Left;   CYSTOSCOPY WITH RETROGRADE PYELOGRAM, URETEROSCOPY AND STENT PLACEMENT Bilateral 09/17/2015   Procedure: CYSTOSCOPY WITH BILATERAL RETROGRADE PYELOGRAM, LEFT URETEROSCOPY WITH URETERAL BIOPSY AND  STENT PLACEMENT;  Surgeon: Irine Seal, MD;  Location: [facility];  Service: Urology;  Laterality: Bilateral;   HOLMIUM LASER APPLICATION Left 65/78/4696   Procedure: HOLMIUM LASER APPLICATION;  Surgeon: Irine Seal, MD;  Location: [facility];  Service: Urology;  Laterality: Left;   KNEE ARTHROSCOPY Right 2008   MELANOMA EXCISION  2018   Left arm   right total knee replacement  Right    2009   ROBOT ASSITED LAPAROSCOPIC NEPHROURETERECTOMY Left 12/04/2015   Procedure: XI ROBOT ASSITED LAPAROSCOPIC NEPHROURETERECTOMY;  Surgeon: Cleon Gustin, MD;  Location: [facility];  Service: Urology;  Laterality: Left;   SHOULDER ARTHROSCOPY Right 12/17/2020   Procedure: Right shoulder arthroscopy, debridement, subacromial decompression, distal clavicle resection;  Surgeon: Justice Britain, MD;  Location: [facility];  Service: Orthopedics;  Laterality: Right;  45min   STONE EXTRACTION WITH BASKET Left 09/17/2015   Procedure: STONE EXTRACTION WITH BASKET;  Surgeon: Irine Seal, MD;  Location: [facility];  Service: Urology;  Laterality: Left;   TOTAL KNEE ARTHROPLASTY Right 2009   URETEROSCOPY   10/28/2015   Procedure: DIAGNOSTIC LEFT URETEROSCOPY;  Surgeon: Cleon Gustin, MD;  Location: [facility];  Service: Urology;;   WISDOM TOOTH EXTRACTION      Family History  Problem Relation Age of Onset   Diabetes Mother    Heart disease Mother    Stroke Mother    CAD Mother    Diabetes Father    Congestive Heart Failure Father    Cancer Brother    Heart disease Son    COPD Son    Emphysema Son    Heart attack Brother             Social History:  reports that he has been smoking cigars and cigarettes. He has a 94.50 pack-year smoking history. He quit smokeless tobacco use about 28 years ago.  His smokeless tobacco use included chew. He reports that he does not drink alcohol and does not use drugs.  No Known Allergies  MEDICATIONS:                                                                                                                     Prior to Admission:  Medications Prior to Admission  Medication Sig Dispense Refill Last Dose   amLODipine (NORVASC) 2.5 MG tablet Take 2.5 mg by mouth daily.   12/28/2020   carboxymethylcellulose (REFRESH PLUS) 0.5 % SOLN Place 1 drop into both eyes in the morning, at noon, and at bedtime.   unknown   cyclobenzaprine (FLEXERIL) 10 MG tablet Take 1 tablet (10 mg total) by mouth 3 (three) times daily as needed for muscle spasms. 30 tablet 1 Past Week   eltrombopag (PROMACTA) 25 MG tablet Take 25 mg by mouth daily.   12/28/2020  loratadine (CLARITIN) 10 MG tablet Take 10 mg by mouth daily.   unknown   naproxen (NAPROSYN) 500 MG tablet Take 1 tablet (500 mg total) by mouth 2 (two) times daily with a meal. 60 tablet 1 unknown   oxyCODONE (OXY IR/ROXICODONE) 5 MG immediate release tablet Take 5 mg by mouth every 6 (six) hours as needed.   Past Week   pregabalin (LYRICA) 75 MG capsule Take 75 mg by mouth 2 (two) times daily.   12/28/2020   White Petrolatum-Mineral Oil (REFRESH P.M. OP) Place 1 application into both eyes at bedtime.   12/28/2020    aspirin EC 81 MG tablet Take 1 tablet (81 mg total) by mouth daily. (Patient not taking: Reported on 12/09/2020) 90 tablet 1 Not Taking   atorvastatin (LIPITOR) 10 MG tablet Take 1 tablet (10 mg total) by mouth daily. (Patient not taking: Reported on 12/09/2020) 90 tablet 2 Not Taking   fluticasone (FLONASE) 50 MCG/ACT nasal spray SPRAY 2 SPRAYS INTO EACH NOSTRIL EVERY DAY (Patient not taking: Reported on 12/09/2020) 48 mL 1 Not Taking   lisinopril (ZESTRIL) 10 MG tablet TAKE 1 TABLET (10 MG TOTAL) BY MOUTH DAILY. NEEDS TO BE SEEN BEFORE NEXT REFILL. (Patient not taking: Reported on 12/09/2020) 30 tablet 0 Not Taking   ondansetron (ZOFRAN) 4 MG tablet Take 1 tablet (4 mg total) by mouth every 8 (eight) hours as needed for nausea or vomiting. (Patient not taking: Reported on 12/29/2020) 10 tablet 0 Not Taking   oxyCODONE-acetaminophen (PERCOCET) 5-325 MG tablet Take 1 tablet by mouth every 4 (four) hours as needed (max 6 q). (Patient not taking: Reported on 12/29/2020) 20 tablet 0 Not Taking   Scheduled:  chlorhexidine gluconate (MEDLINE KIT)  15 mL Mouth Rinse BID   Chlorhexidine Gluconate Cloth  6 each Topical Daily   fentaNYL (SUBLIMAZE) injection  25 mcg Intravenous Once   insulin aspart  0-15 Units Subcutaneous Q4H   lactulose  20 g Per Tube TID   mouth rinse  15 mL Mouth Rinse 10 times per day   pantoprazole (PROTONIX) IV  40 mg Intravenous Q12H   Continuous:  sodium chloride     fentaNYL infusion INTRAVENOUS 25 mcg/hr (12/30/20 0600)   piperacillin-tazobactam (ZOSYN)  IV 12.5 mL/hr at 12/30/20 0600   propofol (DIPRIVAN) infusion 20 mcg/kg/min (12/30/20 0727)   sodium bicarbonate 150 mEq in D5W infusion Stopped (12/30/20 0202)   vancomycin       ROS:                                                                                                                                       Unable to obtain due to sedation.    Blood pressure (!) 136/102, pulse (!) 107, temperature 98.1 F  (36.7 C), resp. rate (!) 32, SpO2 100 %.   General Examination:  Physical Exam  HEENT-  [place]/AT. Neck is supple. Scleral icterus noted.    Lungs- Intubated Extremities- Noncyanotic. Mild edema noted.   Neurological Examination Mental Status: Intubated and sedated on propofol at a rate of 20 and fentanyl at a rate of 25. No eye opening or attempts to communicate in response to calling of his name or noxious stimuli. Semipurposeful movements of his RUE to noxious, but does not localize.  Cranial Nerves: II: PERRL 3 mm >> 2 mm. No blink to threat.   III,IV, VI: Eyes are conjugate at the midline. No spontaneous eye movements. Does not move eyes towards or away from visual stimuli. Absent oculocephalic reflex.  V,VII: Face flaccidly symmetric. No blink to eyelid stimulation.  VIII: No response to auditory stimuli IX,X: Intubated XI: Head is midline XII: Intubated Motor/Sensory: RUE: Moves slowly and semipurposefully with RUE to noxious, including antigravity movement.  LUE: No movement to noxious. Flaccid tone. RUE will move in response to pinching LUE.  RLE with weak withdrawal to noxious.  LLE with no movement to noxious, but RUE will move semipurposefully when LLE is pinched.  Deep Tendon Reflexes: Hypoactive x 4 Plantars: Mute bilaterally Cerebellar/Gait: Unable to assess    Lab Results: Basic Metabolic Panel: Recent Labs  Lab 12/29/20 0908  NA 141  K 4.8  CL 108  CO2 9*  GLUCOSE 284*  BUN 95*  CREATININE 1.77*  CALCIUM 8.6*    CBC: Recent Labs  Lab 12/29/20 0908  WBC 40.7*  NEUTROABS 26.9*  HGB 7.1*  HCT 23.2*  MCV 107.4*  PLT 539*    Cardiac Enzymes: No results for input(s): CKTOTAL, CKMB, CKMBINDEX, TROPONINI in the last 168 hours.  Lipid Panel: No results for input(s): CHOL, TRIG, HDL, CHOLHDL, VLDL, LDLCALC in the last 168 hours.  Imaging: CT Head Wo  Contrast  Result Date: 12/29/2020 CLINICAL DATA:  Found unresponsive on the floor this morning. EXAM: CT HEAD WITHOUT CONTRAST CT CERVICAL SPINE WITHOUT CONTRAST TECHNIQUE: Multidetector CT imaging of the head and cervical spine was performed following the standard protocol without intravenous contrast. Multiplanar CT image reconstructions of the cervical spine were also generated. COMPARISON:  None. FINDINGS: CT HEAD FINDINGS Brain: Ill-defined hypodensity involving the right basal ganglia and right frontal lobe, concerning for acute infarct. No hemorrhage, hydrocephalus, extra-axial collection or mass lesion/mass effect. Mild generalized cerebral atrophy. Scattered mild periventricular and subcortical white matter hypodensities are nonspecific, but favored to reflect chronic microvascular ischemic changes. Vascular: Calcified atherosclerosis at the skull base. No hyperdense vessel. Skull: Normal. Negative for fracture or focal lesion. Sinuses/Orbits: No acute finding. Other: None. CT CERVICAL SPINE FINDINGS Alignment: Trace retrolisthesis at C3-C4. Skull base and vertebrae: No acute fracture. No primary bone lesion or focal pathologic process. Soft tissues and spinal canal: No prevertebral fluid or swelling. No visible canal hematoma. Disc levels: Solid osseous fusion at C4-C5. Moderate to severe disc height loss and uncovertebral hypertrophy at C3-C4, C5-C6, and C6-C7. Upper chest: Centrilobular emphysema. Other: Endotracheal and enteric tubes noted. IMPRESSION: 1. Ill-defined hypodensity involving the right basal ganglia and right frontal lobe, concerning for acute right MCA territory infarct. 2. No acute cervical spine fracture or traumatic listhesis. Moderate to severe degenerative changes throughout the cervical spine. 3. Emphysema (ICD10-J43.9). These results were called by telephone at the time of interpretation on 12/29/2020 at 10:09 am to provider [facility] , who verbally acknowledged these  results. Electronically Signed   By: Titus Dubin M.D.   On: 12/29/2020 10:13   CT  Cervical Spine Wo Contrast  Result Date: 12/29/2020 CLINICAL DATA:  Found unresponsive on the floor this morning. EXAM: CT HEAD WITHOUT CONTRAST CT CERVICAL SPINE WITHOUT CONTRAST TECHNIQUE: Multidetector CT imaging of the head and cervical spine was performed following the standard protocol without intravenous contrast. Multiplanar CT image reconstructions of the cervical spine were also generated. COMPARISON:  None. FINDINGS: CT HEAD FINDINGS Brain: Ill-defined hypodensity involving the right basal ganglia and right frontal lobe, concerning for acute infarct. No hemorrhage, hydrocephalus, extra-axial collection or mass lesion/mass effect. Mild generalized cerebral atrophy. Scattered mild periventricular and subcortical white matter hypodensities are nonspecific, but favored to reflect chronic microvascular ischemic changes. Vascular: Calcified atherosclerosis at the skull base. No hyperdense vessel. Skull: Normal. Negative for fracture or focal lesion. Sinuses/Orbits: No acute finding. Other: None. CT CERVICAL SPINE FINDINGS Alignment: Trace retrolisthesis at C3-C4. Skull base and vertebrae: No acute fracture. No primary bone lesion or focal pathologic process. Soft tissues and spinal canal: No prevertebral fluid or swelling. No visible canal hematoma. Disc levels: Solid osseous fusion at C4-C5. Moderate to severe disc height loss and uncovertebral hypertrophy at C3-C4, C5-C6, and C6-C7. Upper chest: Centrilobular emphysema. Other: Endotracheal and enteric tubes noted. IMPRESSION: 1. Ill-defined hypodensity involving the right basal ganglia and right frontal lobe, concerning for acute right MCA territory infarct. 2. No acute cervical spine fracture or traumatic listhesis. Moderate to severe degenerative changes throughout the cervical spine. 3. Emphysema (ICD10-J43.9). These results were called by telephone at the time of  interpretation on 12/29/2020 at 10:09 am to provider [facility] , who verbally acknowledged these results. Electronically Signed   By: Titus Dubin M.D.   On: 12/29/2020 10:13   DG Chest Port 1 View  Result Date: 12/29/2020 CLINICAL DATA:  Mental status change.  Unresponsive. EXAM: PORTABLE CHEST 1 VIEW COMPARISON:  01/21/2019 FINDINGS: Endotracheal tube with the tip 5.8 cm above the carina. Nasogastric tube with the tip projecting over the mid thoracic of esophagus. Recommend advancing the tube 20 cm. No focal consolidation. No pleural effusion or pneumothorax. Heart and mediastinal contours are unremarkable. No acute osseous abnormality. IMPRESSION: Endotracheal tube with the tip 5.8 cm above the carina. Nasogastric tube with the tip projecting over the mid thoracic of esophagus. Recommend advancing the tube 20 cm. Electronically Signed   By: Kathreen Devoid M.D.   On: 12/29/2020 10:00     Assessment: 75 year old male with subacute right MCA territory ischemic strokes 1. Exam reveals left hemiparesis/plegia. The patient is unresponsive on propofol and fentanyl sedation.  2. CT head: Ill-defined hypodensity involving the right basal ganglia and right frontal lobe, concerning for acute right MCA territory infarct.  3. CT cervical spine: No acute cervical spine fracture or traumatic listhesis. Moderate to severe degenerative changes throughout the cervical spine. 4. Given the basal ganglia location of the completed stroke, which would put him at increased risk for hemorrhagic conversion, risks of an endovascular procedure are felt to significantly outweigh potential benefits.  5. Stroke risk factors: Stroke risk factors: Cancer history, HLD, HTN, tobacco use and DM2  Recommendations: 1. HgbA1c, fasting lipid panel 2. MRI/MRA of the brain without contrast 3. Carotid ultrasound  4. Echocardiogram 5. PT consult, OT consult, Speech consult  6. Home ASA has been held due to GIB 7. Risk factor  modification 8. Telemetry monitoring 9. Frequent neuro checks 10. Management of medical comorbidities per CCM.   40 minutes spent in the neurological evaluation and management of this critically ill patient.  Electronically signed: Dr. Kerney Elbe 12/29/2020, 10:53 PM

## 2020-12-29 NOTE — ED Provider Notes (Signed)
°  Physical Exam  BP 108/69    Pulse (!) 101    Temp (!) 97.2 F (36.2 C)    Resp (!) 26    SpO2 100%   Physical Exam  ED Course/Procedures   Clinical Course as of 12/29/20 1934  Tue Dec 29, 2020  0937 Family arrived, patient does not have advanced directives or CODE STATUS.  We discussed his critical status.  They want to proceed with resuscitation and evaluation.  Patient intubated. [EW]  5520 Discussed head and cervical spine CT with radiologist who is reading them.  Patient has an acute right MCA stroke visible on the CT imaging [EW]  1022 Per nursing, patient with about half a liter of coffee-ground material from OG tube placement. [EW]  8022 I discussed critical nature of his status with family members who understand that he will be hospitalized. [EW]  [facility] intensivist, Dr. Melvyn Novas suggested that I call [facility] for transfer [EW]  1238 Arterial blood gas results noted with persistent acidosis, elevated PO2 and depressed PCO2 [EW]  1238 Blood pressure improving last 111/61 at 1227. [EW]  3361 Awaiting transfer for admission to [facility]. [EW]    Clinical Course User Index [EW] Daleen Bo, MD    Procedures  MDM  Patient pending transfer to [facility] ICU.  However no available beds.  Had become more awake and required some sedation here.  Bed placement is reportedly requesting ER to ER transfer.  Will tell ER provider at [facility], Ovid Curd, MD 12/29/20 [phone]

## 2020-12-29 NOTE — Progress Notes (Signed)
ABG performed for [facility] transport.  Awaiting results.

## 2020-12-29 NOTE — ED Notes (Signed)
Date and time results received: 12/29/20 1115   Test: [facility] Critical Value: 6.920  Name of Provider Notified: Dr. Eulis Foster  Orders Received? Or Actions Taken?: Notified

## 2020-12-29 NOTE — ED Triage Notes (Signed)
LKW 2100 last night patient got up around 0600 to go to bathroom and pta found patient unresponsive bedside toilet.  Patient is pale and yellow.  Just had surgery on right arm.  Patient initial sats 75% and BP 74/50 HR 80.

## 2020-12-29 NOTE — ED Notes (Signed)
Pt temp at 97, pt diaphoretic, warming blanket removed from pt

## 2020-12-29 NOTE — ED Provider Notes (Signed)
[facility] EMERGENCY DEPARTMENT Provider Note   CSN: [id number] Arrival date & time: 12/29/20  2202     History Chief Complaint  Patient presents with   Altered Mental Status    Dalton Miller is a 75 y.o. male.  HPI He presents via EMS for evaluation of fall, and aspirin.  Patient was seen by family members before bedtime last night.  He is unable to give any history.   Level 5 caveat-altered mental status    Patient with complicated history, including: Recurrent metastatic melanoma, lung cancer, thrombocytopenia, urothelial carcinoma  9:35 AM-additional history from family members.  He is recovering from recent shoulder surgery, and going through rehab.  Yesterday he complained of upper abdominal pain and had a black-colored stool.  He has been fairly functional, living with a grandson, and able to ambulate around the house, feed and dress himself.   Past Medical History:  Diagnosis Date   Acquired renal cyst of right kidney    Arthritis    Cancer ([place])    renal cell    Erectile dysfunction    Heart murmur    CHILDHOOD   Hematuria    left ureteral bleeding   History of cellulitis    2012 left wrist   History of kidney stones    Hyperlipidemia    Hypertension    Metastatic melanoma to lung, left (HCC)    Nephrolithiasis    bilateral non-obstructive per ct 07-02-2015   Type 2 diabetes mellitus ([place])    Wears glasses     Patient Active Problem List   Diagnosis Date Noted   CVA (cerebral vascular accident) ([place]) 12/29/2020   Anxiety state 05/11/2020   Bilateral plantar fasciitis 05/11/2020   Bladder disorder, unspecified 05/11/2020   Shortness of breath 05/11/2020   Encounter for screening for malignant neoplasm of respiratory organs 05/11/2020   Lung mass 05/11/2020   Malignant neoplasm of unspecified part of left bronchus or lung ([place]) 05/11/2020   Multiple nodules of lung 05/11/2020   Osteoarthrosis, unspecified whether  generalized or localized, other specified sites 05/11/2020   Thrombocytopenia, unspecified ([place]) 05/11/2020   Hyperlipidemia 05/11/2020   Pain in shoulder 05/11/2020   Malignant neoplasm of upper lobe of left lung ([place]) 03/12/2019   Malignant melanoma ([place]) 03/12/2019   Bladder polyps 09/21/2018   COPD (chronic obstructive pulmonary disease) ([place]) 03/19/2018   Single kidney 03/22/2016   Primary ureteral papillary carcinoma, 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   Overweight (BMI 25.0-29.9) 07/27/2015   Vitamin D deficiency 02/16/2014   Hypertension associated with diabetes ([place]) 02/14/2014   Tobacco user    Erectile dysfunction    Diabetes mellitus ([place]) 03/22/2010   Hyperlipidemia associated with type 2 diabetes mellitus ([place]) 03/22/2010    Past Surgical History:  Procedure Laterality Date   CARPAL TUNNEL RELEASE Right 1990  approx   COLONOSCOPY  03/04/2010   CYSTOSCOPY W/ RETROGRADES Left 10/28/2015   Procedure: CYSTOSCOPY WITH LEFT RETROGRADE PYELOGRAM;  Surgeon: Cleon Gustin, MD;  Location: [facility];  Service: Urology;  Laterality: Left;   CYSTOSCOPY W/ URETERAL STENT PLACEMENT Left 10/28/2015   Procedure: CYSTOSCOPY WITH LEFT URETERAL STENT EXCHANGE;  Surgeon: Cleon Gustin, MD;  Location: [facility];  Service: Urology;  Laterality: Left;   CYSTOSCOPY WITH RETROGRADE PYELOGRAM, URETEROSCOPY AND STENT PLACEMENT Bilateral 09/17/2015   Procedure: CYSTOSCOPY WITH BILATERAL RETROGRADE PYELOGRAM, LEFT URETEROSCOPY WITH URETERAL BIOPSY AND  STENT PLACEMENT;  Surgeon: Irine Seal, MD;  Location: [facility];  Service: Urology;  Laterality:  Bilateral;   HOLMIUM LASER APPLICATION Left 44/03/4740   Procedure: HOLMIUM LASER APPLICATION;  Surgeon: Irine Seal, MD;  Location: [facility];  Service: Urology;  Laterality: Left;   KNEE ARTHROSCOPY Right 2008   MELANOMA EXCISION  2018   Left arm   right total knee replacement  Right    2009   ROBOT  ASSITED LAPAROSCOPIC NEPHROURETERECTOMY Left 12/04/2015   Procedure: XI ROBOT ASSITED LAPAROSCOPIC NEPHROURETERECTOMY;  Surgeon: Cleon Gustin, MD;  Location: [facility];  Service: Urology;  Laterality: Left;   SHOULDER ARTHROSCOPY Right 12/17/2020   Procedure: Right shoulder arthroscopy, debridement, subacromial decompression, distal clavicle resection;  Surgeon: Justice Britain, MD;  Location: [facility];  Service: Orthopedics;  Laterality: Right;  12min   STONE EXTRACTION WITH BASKET Left 09/17/2015   Procedure: STONE EXTRACTION WITH BASKET;  Surgeon: Irine Seal, MD;  Location[facility];  Service: Urology;  Laterality: Left;   TOTAL KNEE ARTHROPLASTY Right 2009   URETEROSCOPY  10/28/2015   Procedure: DIAGNOSTIC LEFT URETEROSCOPY;  Surgeon: Cleon Gustin, MD;  Location: [facility];  Service: Urology;;   WISDOM TOOTH EXTRACTION         Family History  Problem Relation Age of Onset   Diabetes Mother    Heart disease Mother    Stroke Mother    CAD Mother    Diabetes Father    Congestive Heart Failure Father    Cancer Brother    Heart disease Son    COPD Son    Emphysema Son    Heart attack Brother     Social History   Tobacco Use   Smoking status: Every Day    Packs/day: 1.50    Years: 63.00    Pack years: 94.50    Types: Cigars, Cigarettes   Smokeless tobacco: Former    Types: Chew    Quit date: 01/03/1993   Tobacco comments:    Quit smoking cigarettes over 10 years ago- smoke 1.5 pack per day 60 years  Vaping Use   Vaping Use: Never used  Substance Use Topics   Alcohol use: No   Drug use: No    Home Medications Prior to Admission medications   Medication Sig Start Date End Date Taking? Authorizing Provider  amLODipine (NORVASC) 2.5 MG tablet Take 2.5 mg by mouth daily. 03/04/20  Yes [provider]  carboxymethylcellulose (REFRESH PLUS) 0.5 % SOLN Place 1 drop into both eyes in the morning, at noon, and at bedtime. 04/24/20   Yes [provider]  cyclobenzaprine (FLEXERIL) 10 MG tablet Take 1 tablet (10 mg total) by mouth 3 (three) times daily as needed for muscle spasms. 12/17/20  Yes Shuford, Olivia Mackie, PA-C  eltrombopag (PROMACTA) 25 MG tablet Take 25 mg by mouth daily. 04/08/20  Yes [provider]  loratadine (CLARITIN) 10 MG tablet Take 10 mg by mouth daily.   Yes [provider]  naproxen (NAPROSYN) 500 MG tablet Take 1 tablet (500 mg total) by mouth 2 (two) times daily with a meal. 12/17/20  Yes Shuford, Olivia Mackie, PA-C  oxyCODONE (OXY IR/ROXICODONE) 5 MG immediate release tablet Take 5 mg by mouth every 6 (six) hours as needed. 12/24/20  Yes [provider]  pregabalin (LYRICA) 75 MG capsule Take 75 mg by mouth 2 (two) times daily. 02/28/20  Yes [provider]  Jay Schlichter Oil (REFRESH P.M. OP) Place 1 application into both eyes at bedtime.   Yes [provider]  aspirin EC 81 MG tablet  Take 1 tablet (81 mg total) by mouth daily. Patient not taking: Reported on 12/09/2020 03/22/16   Evelina Dun A, FNP  atorvastatin (LIPITOR) 10 MG tablet Take 1 tablet (10 mg total) by mouth daily. Patient not taking: Reported on 12/09/2020 05/20/19   Sharion Balloon, FNP  fluticasone [facility]) 50 MCG/ACT nasal spray SPRAY 2 SPRAYS INTO EACH NOSTRIL EVERY DAY Patient not taking: Reported on 12/09/2020 05/20/19   Evelina Dun A, FNP  lisinopril (ZESTRIL) 10 MG tablet TAKE 1 TABLET (10 MG TOTAL) BY MOUTH DAILY. NEEDS TO BE SEEN BEFORE NEXT REFILL. Patient not taking: Reported on 12/09/2020 06/10/19   Evelina Dun A, FNP  ondansetron (ZOFRAN) 4 MG tablet Take 1 tablet (4 mg total) by mouth every 8 (eight) hours as needed for nausea or vomiting. Patient not taking: Reported on 12/29/2020 12/17/20   Shuford, Olivia Mackie, PA-C  oxyCODONE-acetaminophen (PERCOCET) 5-325 MG tablet Take 1 tablet by mouth every 4 (four) hours as needed (max 6 q). Patient not taking: Reported on 12/29/2020  12/17/20   Shuford, Olivia Mackie, PA-C    Allergies    Patient has no known allergies.  Review of Systems   Review of Systems  Unable to perform ROS: Mental status change   Physical Exam Updated Vital Signs BP 133/67    Pulse 91    Temp (!) 88.3 F (31.3 C)    Resp 20    SpO2 100%   Physical Exam Vitals and nursing note reviewed.  Constitutional:      General: He is in acute distress.     Appearance: He is well-developed and normal weight. He is not ill-appearing or diaphoretic.  HENT:     Head: Normocephalic and atraumatic.     Right Ear: External ear normal.     Left Ear: External ear normal.  Eyes:     Conjunctiva/sclera: Conjunctivae normal.     Pupils: Pupils are equal, round, and reactive to light.     Comments: Mild eye deviation to the right with the right eye.  Pupils 3 to 4 mm bilaterally and sluggishly reactive  Cardiovascular:     Rate and Rhythm: Normal rate and regular rhythm.     Heart sounds: Normal heart sounds.  Pulmonary:     Effort: Pulmonary effort is normal. No respiratory distress.     Breath sounds: No stridor.     Comments: Tachypneic Abdominal:     General: There is no distension.     Palpations: Abdomen is soft.     Tenderness: There is no abdominal tenderness.  Musculoskeletal:        General: No swelling or tenderness.     Cervical back: Normal range of motion and neck supple.     Comments: No large joint deformity  Skin:    Comments: Cool skin.  Jaundiced.  Scattered ecchymosis of the extremities.  Neurological:     Mental Status: He is alert.     Motor: No abnormal muscle tone.  Psychiatric:     Comments: Obtunded    ED Results / Procedures / Treatments   Labs (all labs ordered are listed, but only abnormal results are displayed) Labs Reviewed  RESP PANEL BY RT-PCR (FLU A&B, COVID) ARPGX2 - Abnormal; Notable for the following components:      Result Value   SARS Coronavirus 2 by RT PCR POSITIVE (*)    All other components within  normal limits  BLOOD GAS, VENOUS - Abnormal; Notable for the following components:   pH, Ven  6.920 (*)    pCO2, Ven 35.8 (*)    pO2, Ven 45.9 (*)    Bicarbonate 7.0 (*)    Acid-base deficit 22.7 (*)    All other components within normal limits  COMPREHENSIVE METABOLIC PANEL - Abnormal; Notable for the following components:   CO2 9 (*)    Glucose, Bld 284 (*)    BUN 95 (*)    Creatinine, Ser 1.77 (*)    Calcium 8.6 (*)    Total Protein 5.3 (*)    Albumin 2.6 (*)    AST 324 (*)    ALT 229 (*)    Alkaline Phosphatase 261 (*)    Total Bilirubin 3.6 (*)    GFR, Estimated 40 (*)    Anion gap 24 (*)    All other components within normal limits  LACTIC ACID, PLASMA - Abnormal; Notable for the following components:   Lactic Acid, Venous >9.0 (*)    All other components within normal limits  LACTIC ACID, PLASMA - Abnormal; Notable for the following components:   Lactic Acid, Venous >9.0 (*)    All other components within normal limits  CBC WITH DIFFERENTIAL/PLATELET - Abnormal; Notable for the following components:   WBC 40.7 (*)    RBC 2.16 (*)    Hemoglobin 7.1 (*)    HCT 23.2 (*)    MCV 107.4 (*)    RDW 16.7 (*)    Platelets 539 (*)    nRBC 0.5 (*)    Neutro Abs 26.9 (*)    Lymphs Abs 7.3 (*)    Monocytes Absolute 3.3 (*)    All other components within normal limits  AMMONIA - Abnormal; Notable for the following components:   Ammonia 115 (*)    All other components within normal limits  URINALYSIS, ROUTINE W REFLEX MICROSCOPIC - Abnormal; Notable for the following components:   Glucose, UA 100 (*)    All other components within normal limits  OCCULT BLOOD GASTRIC / DUODENUM (SPECIMEN CUP) - Abnormal; Notable for the following components:   Occult Blood, Gastric POSITIVE (*)    All other components within normal limits  BLOOD GAS, ARTERIAL - Abnormal; Notable for the following components:   pH, Arterial 7.061 (*)    pCO2 arterial 25.7 (*)    pO2, Arterial 484 (*)     Bicarbonate 8.3 (*)    Acid-base deficit 21.2 (*)    All other components within normal limits  CULTURE, BLOOD (ROUTINE X 2)  CULTURE, BLOOD (ROUTINE X 2)  URINE CULTURE  ETHANOL  TYPE AND SCREEN  PREPARE RBC (CROSSMATCH)    EKG EKG Interpretation  Date/Time:  Tuesday December 29 2020 09:25:51 EST Ventricular Rate:  76 PR Interval:  152 QRS Duration: 171 QT Interval:  461 QTC Calculation: 519 R Axis:   65 Text Interpretation: Sinus rhythm Nonspecific intraventricular conduction delay Minimal ST depression, inferior leads Since last tracing of earlier today No significant change was found Confirmed by Daleen Bo [phone]) on 12/29/2020 1:58:53 PM  Radiology CT Head Wo Contrast  Result Date: 12/29/2020 CLINICAL DATA:  Found unresponsive on the floor this morning. EXAM: CT HEAD WITHOUT CONTRAST CT CERVICAL SPINE WITHOUT CONTRAST TECHNIQUE: Multidetector CT imaging of the head and cervical spine was performed following the standard protocol without intravenous contrast. Multiplanar CT image reconstructions of the cervical spine were also generated. COMPARISON:  None. FINDINGS: CT HEAD FINDINGS Brain: Ill-defined hypodensity involving the right basal ganglia and right frontal lobe, concerning for acute infarct. No  hemorrhage, hydrocephalus, extra-axial collection or mass lesion/mass effect. Mild generalized cerebral atrophy. Scattered mild periventricular and subcortical white matter hypodensities are nonspecific, but favored to reflect chronic microvascular ischemic changes. Vascular: Calcified atherosclerosis at the skull base. No hyperdense vessel. Skull: Normal. Negative for fracture or focal lesion. Sinuses/Orbits: No acute finding. Other: None. CT CERVICAL SPINE FINDINGS Alignment: Trace retrolisthesis at C3-C4. Skull base and vertebrae: No acute fracture. No primary bone lesion or focal pathologic process. Soft tissues and spinal canal: No prevertebral fluid or swelling. No visible  canal hematoma. Disc levels: Solid osseous fusion at C4-C5. Moderate to severe disc height loss and uncovertebral hypertrophy at C3-C4, C5-C6, and C6-C7. Upper chest: Centrilobular emphysema. Other: Endotracheal and enteric tubes noted. IMPRESSION: 1. Ill-defined hypodensity involving the right basal ganglia and right frontal lobe, concerning for acute right MCA territory infarct. 2. No acute cervical spine fracture or traumatic listhesis. Moderate to severe degenerative changes throughout the cervical spine. 3. Emphysema (ICD10-J43.9). These results were called by telephone at the time of interpretation on 12/29/2020 at 10:09 am to provider [facility] , who verbally acknowledged these results. Electronically Signed   By: Titus Dubin M.D.   On: 12/29/2020 10:13   CT Cervical Spine Wo Contrast  Result Date: 12/29/2020 CLINICAL DATA:  Found unresponsive on the floor this morning. EXAM: CT HEAD WITHOUT CONTRAST CT CERVICAL SPINE WITHOUT CONTRAST TECHNIQUE: Multidetector CT imaging of the head and cervical spine was performed following the standard protocol without intravenous contrast. Multiplanar CT image reconstructions of the cervical spine were also generated. COMPARISON:  None. FINDINGS: CT HEAD FINDINGS Brain: Ill-defined hypodensity involving the right basal ganglia and right frontal lobe, concerning for acute infarct. No hemorrhage, hydrocephalus, extra-axial collection or mass lesion/mass effect. Mild generalized cerebral atrophy. Scattered mild periventricular and subcortical white matter hypodensities are nonspecific, but favored to reflect chronic microvascular ischemic changes. Vascular: Calcified atherosclerosis at the skull base. No hyperdense vessel. Skull: Normal. Negative for fracture or focal lesion. Sinuses/Orbits: No acute finding. Other: None. CT CERVICAL SPINE FINDINGS Alignment: Trace retrolisthesis at C3-C4. Skull base and vertebrae: No acute fracture. No primary bone lesion or  focal pathologic process. Soft tissues and spinal canal: No prevertebral fluid or swelling. No visible canal hematoma. Disc levels: Solid osseous fusion at C4-C5. Moderate to severe disc height loss and uncovertebral hypertrophy at C3-C4, C5-C6, and C6-C7. Upper chest: Centrilobular emphysema. Other: Endotracheal and enteric tubes noted. IMPRESSION: 1. Ill-defined hypodensity involving the right basal ganglia and right frontal lobe, concerning for acute right MCA territory infarct. 2. No acute cervical spine fracture or traumatic listhesis. Moderate to severe degenerative changes throughout the cervical spine. 3. Emphysema (ICD10-J43.9). These results were called by telephone at the time of interpretation on 12/29/2020 at 10:09 am to provider [facility] , who verbally acknowledged these results. Electronically Signed   By: Titus Dubin M.D.   On: 12/29/2020 10:13   DG Chest Port 1 View  Result Date: 12/29/2020 CLINICAL DATA:  Mental status change.  Unresponsive. EXAM: PORTABLE CHEST 1 VIEW COMPARISON:  01/21/2019 FINDINGS: Endotracheal tube with the tip 5.8 cm above the carina. Nasogastric tube with the tip projecting over the mid thoracic of esophagus. Recommend advancing the tube 20 cm. No focal consolidation. No pleural effusion or pneumothorax. Heart and mediastinal contours are unremarkable. No acute osseous abnormality. IMPRESSION: Endotracheal tube with the tip 5.8 cm above the carina. Nasogastric tube with the tip projecting over the mid thoracic of esophagus. Recommend advancing the tube 20 cm. Electronically Signed  By: Kathreen Devoid M.D.   On: 12/29/2020 10:00    Procedures Procedure Name: Intubation Date/Time: 12/29/2020 9:44 AM Performed by: Daleen Bo, MD Pre-anesthesia Checklist: Patient identified, Patient being monitored, Emergency Drugs available, Timeout performed and Suction available Oxygen Delivery Method: Non-rebreather mask Preoxygenation: Pre-oxygenation with 100%  oxygen Induction Type: Rapid sequence Ventilation: Mask ventilation without difficulty Laryngoscope Size: 4 and Glidescope Grade View: Grade I Tube size: 7.5 mm Number of attempts: 1 Placement Confirmation: ETT inserted through vocal cords under direct vision, CO2 detector and Breath sounds checked- equal and bilateral Secured at: 23 cm Tube secured with: ETT holder    .Critical Care Performed by: Daleen Bo, MD Authorized by: Daleen Bo, MD   Critical care provider statement:    Critical care time (minutes):  130   Critical care start time:  12/29/2020 8:55 AM   Critical care end time:  12/29/2020 4:27 PM   Critical care time was exclusive of:  Separately billable procedures and treating other patients   Critical care was time spent personally by me on the following activities:  Blood draw for specimens, development of treatment plan with patient or surrogate, discussions with consultants, evaluation of patient's response to treatment, examination of patient, ordering and performing treatments and interventions, ordering and review of laboratory studies, ordering and review of radiographic studies, pulse oximetry, re-evaluation of patient's condition and review of old charts   Medications Ordered in ED Medications  0.9 %  sodium chloride infusion (has no administration in time range)  sodium bicarbonate 150 mEq in dextrose 5 % 1,150 mL infusion ( Intravenous New Bag/Given 12/29/20 1157)  vancomycin (VANCOREADY) IVPB 750 mg/150 mL (has no administration in time range)  piperacillin-tazobactam (ZOSYN) IVPB 3.375 g (has no administration in time range)  sodium chloride 0.9 % bolus 500 mL (0 mLs Intravenous Stopped 12/29/20 1055)  naloxone (NARCAN) 2 MG/2ML injection (2 mg  Given 12/29/20 0910)  0.9 %  sodium chloride infusion (0 mLs Intravenous Stopped 12/29/20 1010)  etomidate (AMIDATE) injection (20 mg Intravenous Given 12/29/20 0932)  rocuronium (ZEMURON) injection (75 mg  Intravenous Given 12/29/20 0933)  pantoprazole (PROTONIX) injection 80 mg (80 mg Intravenous Given 12/29/20 1110)  piperacillin-tazobactam (ZOSYN) IVPB 3.375 g (0 g Intravenous Stopped 12/29/20 1158)  vancomycin (VANCOREADY) IVPB 1750 mg/350 mL (0 mg Intravenous Stopped 12/29/20 1347)    ED Course  I have reviewed the triage vital signs and the nursing notes.  Pertinent labs & imaging results that were available during my care of the patient were reviewed by me and considered in my medical decision making (see chart for details).  Clinical Course as of 12/29/20 1359  Tue Dec 29, 2020  0937 Family arrived, patient does not have advanced directives or CODE STATUS.  We discussed his critical status.  They want to proceed with resuscitation and evaluation.  Patient intubated. [EW]  9629 Discussed head and cervical spine CT with radiologist who is reading them.  Patient has an acute right MCA stroke visible on the CT imaging [EW]  1022 Per nursing, patient with about half a liter of coffee-ground material from OG tube placement. [EW]  5284 I discussed critical nature of his status with family members who understand that he will be hospitalized. [EW]  [place] intensivist, Dr. Melvyn Novas suggested that I call [facility] for transfer [EW]  1238 Arterial blood gas results noted with persistent acidosis, elevated PO2 and depressed PCO2 [EW]  1238 Blood pressure improving last 111/61 at 1227. [EW]  1324 [id number]  transfer for admission to [facility]. [EW]    Clinical Course User Index [EW] Daleen Bo, MD   MDM Rules/Calculators/A&P                          Patient Vitals for the past 24 hrs:  BP Temp Temp src Pulse Resp SpO2  12/29/20 1330 133/67 (!) 88.3 F (31.3 C) -- 91 20 100 %  12/29/20 1315 139/67 (!) 88 F (31.1 C) -- 91 20 100 %  12/29/20 1300 128/66 (!) 87.6 F (30.9 C) -- 89 (!) 22 100 %  12/29/20 1245 116/60 (!) 87.4 F (30.8 C) -- 88 20 100 %  12/29/20 1227 111/61 (!) 86.9  F (30.5 C) Bladder 86 (!) 22 100 %  12/29/20 1215 104/65 (!) 86.7 F (30.4 C) -- 83 (!) 24 100 %  12/29/20 1201 (!) 95/51 (!) 96.4 F (35.8 C) -- 82 20 100 %  12/29/20 1200 (!) 95/51 (!) 86.4 F (30.2 C) -- 82 (!) 0 100 %  12/29/20 1145 (!) 89/61 (!) 86.2 F (30.1 C) -- 81 20 100 %  12/29/20 1130 101/61 (!) 86 F (30 C) -- 81 (!) 0 100 %  12/29/20 1115 101/60 (!) 85.8 F (29.9 C) -- 80 (!) 0 100 %  12/29/20 1100 121/62 (!) 85.6 F (29.8 C) -- 79 20 100 %  12/29/20 1054 -- (!) 85.6 F (29.8 C) -- 77 (!) 0 100 %  12/29/20 1045 136/61 -- -- 78 13 100 %  12/29/20 1030 127/63 -- -- 77 (!) 22 100 %  12/29/20 1015 119/61 -- -- 77 (!) 0 100 %  12/29/20 1000 96/63 -- -- 80 19 100 %  12/29/20 0941 (!) 87/62 -- -- 81 (!) 22 100 %  12/29/20 0922 (!) 145/107 -- -- 76 (!) 26 96 %  12/29/20 0903 -- -- -- 77 (!) 28 --  12/29/20 0903 (!) 154/114 (!) 86.8 F (30.4 C) Rectal 77 (!) 28 100 %  12/29/20 0858 -- -- -- -- -- (!) 85 %  12/29/20 0855 -- -- -- -- -- (!) 85 %    11:17 AM Reevaluation with update and discussion. After initial assessment and treatment, an updated evaluation reveals stable on ventilator. Daleen Bo   Medical Decision Making:  This patient is presenting for evaluation of abdominal pain, fall, decreased responsiveness, which does require a range of treatment options, and is a complaint that involves a high risk of morbidity and mortality. The differential diagnoses include progression of illnesses, infection, stroke, complications from liver failure. I decided to review old records, and in summary elderly male followed at the [place], with history of metastatic melanoma, thrombocytopenia, melanoma, lung cancer, urothelial carcinoma presenting for altered mental status found down at home.  I received additional historical information from family members.  Clinical Laboratory Tests Ordered, included CBC, Metabolic panel, Urinalysis, and viral panel, venous blood gas, lactate,  blood cultures . Review indicates initial findings with blood count remarkable for high white count and low hemoglobin, MCV high, lactate elevated, venous pH low, venous PCO2 low. Radiologic Tests Ordered, included chest x-ray, CT head, CT cervical spine.  I independently Visualized: Radiograph images, which show ET tube above carina 5.8 cm, right MCA stroke; otherwise normal  Cardiac Monitor Tracing which shows normal sinus rhythm    Critical Interventions-clinical evaluation, supportive oxygenation with facemask, Narcan with partial recovery of movement, intubation, IV fluids, sedation, radiographic imaging  After These Interventions,  the Patient was reevaluated and was found severely hypothermic, not protecting airway, comatose, intubated for airway protection and to assist with work-up, acute right brain stroke found on CT imaging, severe lactate elevation, hyperammonemia, elevated white blood cell count, unspecified.  Empiric antibiotics ordered.  Consultation with intensivist.  CRITICAL CARE-yes Performed by: Daleen Bo  Nursing Notes Reviewed/ Care Coordinated Applicable Imaging Reviewed Interpretation of Laboratory Data incorporated into ED treatment   Consult intensivist to arrange for transfer and admission-11:20 AM-Dr. Carlis Abbott accepts the patient to [facility] ICU       Final Clinical Impression(s) / ED Diagnoses Final diagnoses:  Cerebrovascular accident (CVA), unspecified mechanism ([place])  Gastrointestinal hemorrhage, unspecified gastrointestinal hemorrhage type  Hyperammonemia ([place])  Hypothermia, initial encounter    Rx / DC Orders ED Discharge Orders     None        Daleen Bo, MD 12/29/20 1627

## 2020-12-29 NOTE — ED Notes (Signed)
C- link called at this time for transport. Dalton Miller [id number]

## 2020-12-29 NOTE — ED Notes (Signed)
Patient opening eyes and answering yes and no questions and following commands.  Able to wiggle toes on both feet.

## 2020-12-29 NOTE — Progress Notes (Signed)
ABG ordered to evaluate respiratory status prior to transfer

## 2020-12-29 NOTE — ED Notes (Signed)
Called bed placement and they are having to move patients around due to no available bed.  Patient will get bed once patients moved out and room is cleaned'

## 2020-12-29 NOTE — Progress Notes (Signed)
Got results from lab on ABG and gave to [facility] transport.  ABG 7.362 pH, PaCO2 28.7, PO2 263, Bicarb 17.9.  Will call report to [facility] on 4N.

## 2020-12-29 NOTE — ED Notes (Signed)
Patient able to nod yes or no when asked questions.  Still unable to move left side.

## 2020-12-29 NOTE — Progress Notes (Signed)
Received patient from [facility]. Placed on mechanical ventilation, previous vent settings. Verified with CCM at bedside.

## 2020-12-29 NOTE — ED Notes (Signed)
Advanced OG approx 20 cm and immediately suctioned out 500cc dark blood

## 2020-12-29 NOTE — Progress Notes (Signed)
Report called to [facility], RRT at [facility].

## 2020-12-29 NOTE — ED Notes (Signed)
Report given to [facility] 

## 2020-12-29 NOTE — Progress Notes (Signed)
Pharmacy Antibiotic Note  Dalton Miller is a 75 y.o. male admitted on 12/29/2020 with  unknown source of infection .  Pharmacy has been consulted for Vancomycin and Zosyn dosing.  Plan: Vancomycin 1750 mg IV x 1  Vancomycin 750 mg IV every 24 hours. Zosyn 3.375g IV every 8 hours. Monitor labs, c/s, and vanco level as indicated.     Temp (24hrs), Avg:87.3 F (30.7 C), Min:85.6 F (29.8 C), Max:96.4 F (35.8 C)  Recent Labs  Lab 12/29/20 0908 12/29/20 1101  WBC 40.7*  --   CREATININE 1.77*  --   LATICACIDVEN >9.0* >9.0*    Estimated Creatinine Clearance: 34.2 mL/min (A) (by C-G formula based on SCr of 1.77 mg/dL (H)).    No Known Allergies  Antimicrobials this admission: Vanco 12/27  Zosyn 12/27  Microbiology results: 12/27 BCx: pending 12/27 UCx: pending    Thank you for allowing pharmacy to be a part of this patients care.  Ramond Craver 12/29/2020 12:23 PM

## 2020-12-30 ENCOUNTER — Telehealth (HOSPITAL_COMMUNITY): Payer: Self-pay | Admitting: Occupational Therapy

## 2020-12-30 ENCOUNTER — Encounter (HOSPITAL_COMMUNITY): Payer: Self-pay | Admitting: Pulmonary Disease

## 2020-12-30 ENCOUNTER — Encounter (HOSPITAL_COMMUNITY)
Admission: EM | Disposition: A | Discharge status: Rehabilitation Facility | Payer: Self-pay | Source: Home / Self Care | Attending: Pulmonary Disease

## 2020-12-30 ENCOUNTER — Inpatient Hospital Stay (HOSPITAL_COMMUNITY): Payer: No Typology Code available for payment source

## 2020-12-30 DIAGNOSIS — J9601 Acute respiratory failure with hypoxia: Secondary | ICD-10-CM | POA: Diagnosis not present

## 2020-12-30 DIAGNOSIS — D75839 Thrombocytosis, unspecified: Secondary | ICD-10-CM | POA: Diagnosis present

## 2020-12-30 DIAGNOSIS — I6389 Other cerebral infarction: Secondary | ICD-10-CM

## 2020-12-30 DIAGNOSIS — G928 Other toxic encephalopathy: Secondary | ICD-10-CM

## 2020-12-30 DIAGNOSIS — R6520 Severe sepsis without septic shock: Secondary | ICD-10-CM | POA: Diagnosis present

## 2020-12-30 DIAGNOSIS — D72829 Elevated white blood cell count, unspecified: Secondary | ICD-10-CM

## 2020-12-30 DIAGNOSIS — R7989 Other specified abnormal findings of blood chemistry: Secondary | ICD-10-CM | POA: Diagnosis not present

## 2020-12-30 DIAGNOSIS — I6781 Acute cerebrovascular insufficiency: Secondary | ICD-10-CM

## 2020-12-30 DIAGNOSIS — A419 Sepsis, unspecified organism: Secondary | ICD-10-CM | POA: Diagnosis present

## 2020-12-30 DIAGNOSIS — K920 Hematemesis: Secondary | ICD-10-CM | POA: Diagnosis not present

## 2020-12-30 DIAGNOSIS — K921 Melena: Secondary | ICD-10-CM | POA: Diagnosis not present

## 2020-12-30 DIAGNOSIS — I8511 Secondary esophageal varices with bleeding: Secondary | ICD-10-CM | POA: Diagnosis not present

## 2020-12-30 DIAGNOSIS — I63231 Cerebral infarction due to unspecified occlusion or stenosis of right carotid arteries: Secondary | ICD-10-CM

## 2020-12-30 DIAGNOSIS — R17 Unspecified jaundice: Secondary | ICD-10-CM

## 2020-12-30 HISTORY — PX: ESOPHAGEAL BANDING: SHX5518

## 2020-12-30 HISTORY — PX: ESOPHAGOGASTRODUODENOSCOPY (EGD) WITH PROPOFOL: SHX5813

## 2020-12-30 LAB — TYPE AND SCREEN
ABO/RH(D): O POS
ABO/RH(D): O POS
Antibody Screen: NEGATIVE
Antibody Screen: NEGATIVE
Unit division: 0
Unit division: 0

## 2020-12-30 LAB — BPAM RBC
Blood Product Expiration Date: 202301212359
Blood Product Expiration Date: 202301212359
ISSUE DATE / TIME: 202212271204
ISSUE DATE / TIME: 202212271458
Unit Type and Rh: 5100
Unit Type and Rh: 5100

## 2020-12-30 LAB — POCT I-STAT 7, (LYTES, BLD GAS, ICA,H+H)
Acid-base deficit: 5 mmol/L — ABNORMAL HIGH (ref 0.0–2.0)
Bicarbonate: 20 mmol/L (ref 20.0–28.0)
Calcium, Ion: 0.98 mmol/L — ABNORMAL LOW (ref 1.15–1.40)
HCT: 27 % — ABNORMAL LOW (ref 39.0–52.0)
Hemoglobin: 9.2 g/dL — ABNORMAL LOW (ref 13.0–17.0)
O2 Saturation: 100 %
Patient temperature: 98.3
Potassium: 5.1 mmol/L (ref 3.5–5.1)
Sodium: 136 mmol/L (ref 135–145)
TCO2: 21 mmol/L — ABNORMAL LOW (ref 22–32)
pCO2 arterial: 33.4 mmHg (ref 32.0–48.0)
pH, Arterial: 7.383 (ref 7.350–7.450)
pO2, Arterial: 195 mmHg — ABNORMAL HIGH (ref 83.0–108.0)

## 2020-12-30 LAB — HEPATITIS PANEL, ACUTE
HCV Ab: NONREACTIVE
Hep A IgM: NONREACTIVE
Hep B C IgM: NONREACTIVE
Hepatitis B Surface Ag: NONREACTIVE

## 2020-12-30 LAB — CBC
HCT: 24.6 % — ABNORMAL LOW (ref 39.0–52.0)
HCT: 26.8 % — ABNORMAL LOW (ref 39.0–52.0)
HCT: 27.3 % — ABNORMAL LOW (ref 39.0–52.0)
Hemoglobin: 8.9 g/dL — ABNORMAL LOW (ref 13.0–17.0)
Hemoglobin: 9.5 g/dL — ABNORMAL LOW (ref 13.0–17.0)
Hemoglobin: 9.6 g/dL — ABNORMAL LOW (ref 13.0–17.0)
MCH: 31.3 pg (ref 26.0–34.0)
MCH: 32 pg (ref 26.0–34.0)
MCH: 32.5 pg (ref 26.0–34.0)
MCHC: 34.8 g/dL (ref 30.0–36.0)
MCHC: 35.8 g/dL (ref 30.0–36.0)
MCHC: 36.2 g/dL — ABNORMAL HIGH (ref 30.0–36.0)
MCV: 89.3 fL (ref 80.0–100.0)
MCV: 89.8 fL (ref 80.0–100.0)
MCV: 89.8 fL (ref 80.0–100.0)
Platelets: 224 10*3/uL (ref 150–400)
Platelets: 253 10*3/uL (ref 150–400)
Platelets: 266 10*3/uL (ref 150–400)
RBC: 2.74 MIL/uL — ABNORMAL LOW (ref 4.22–5.81)
RBC: 3 MIL/uL — ABNORMAL LOW (ref 4.22–5.81)
RBC: 3.04 MIL/uL — ABNORMAL LOW (ref 4.22–5.81)
RDW: 16.1 % — ABNORMAL HIGH (ref 11.5–15.5)
RDW: 16.3 % — ABNORMAL HIGH (ref 11.5–15.5)
RDW: 16.6 % — ABNORMAL HIGH (ref 11.5–15.5)
WBC: 24.8 10*3/uL — ABNORMAL HIGH (ref 4.0–10.5)
WBC: 26.6 10*3/uL — ABNORMAL HIGH (ref 4.0–10.5)
WBC: 27.4 10*3/uL — ABNORMAL HIGH (ref 4.0–10.5)
nRBC: 0.1 % (ref 0.0–0.2)
nRBC: 0.1 % (ref 0.0–0.2)
nRBC: 0.2 % (ref 0.0–0.2)

## 2020-12-30 LAB — BASIC METABOLIC PANEL
Anion gap: 15 (ref 5–15)
Anion gap: 18 — ABNORMAL HIGH (ref 5–15)
BUN: 108 mg/dL — ABNORMAL HIGH (ref 8–23)
BUN: 114 mg/dL — ABNORMAL HIGH (ref 8–23)
CO2: 16 mmol/L — ABNORMAL LOW (ref 22–32)
CO2: 18 mmol/L — ABNORMAL LOW (ref 22–32)
Calcium: 7 mg/dL — ABNORMAL LOW (ref 8.9–10.3)
Calcium: 7.1 mg/dL — ABNORMAL LOW (ref 8.9–10.3)
Chloride: 104 mmol/L (ref 98–111)
Chloride: 106 mmol/L (ref 98–111)
Creatinine, Ser: 2.47 mg/dL — ABNORMAL HIGH (ref 0.61–1.24)
Creatinine, Ser: 2.64 mg/dL — ABNORMAL HIGH (ref 0.61–1.24)
GFR, Estimated: 25 mL/min — ABNORMAL LOW (ref >60)
GFR, Estimated: 27 mL/min — ABNORMAL LOW (ref >60)
Glucose, Bld: 375 mg/dL — ABNORMAL HIGH (ref 70–99)
Glucose, Bld: 398 mg/dL — ABNORMAL HIGH (ref 70–99)
Potassium: 5 mmol/L (ref 3.5–5.1)
Potassium: 5.5 mmol/L — ABNORMAL HIGH (ref 3.5–5.1)
Sodium: 137 mmol/L (ref 135–145)
Sodium: 140 mmol/L (ref 135–145)

## 2020-12-30 LAB — HEPATIC FUNCTION PANEL
ALT: 735 U/L — ABNORMAL HIGH (ref 0–44)
AST: 1186 U/L — ABNORMAL HIGH (ref 15–41)
Albumin: 2.1 g/dL — ABNORMAL LOW (ref 3.5–5.0)
Alkaline Phosphatase: 176 U/L — ABNORMAL HIGH (ref 38–126)
Bilirubin, Direct: 1.7 mg/dL — ABNORMAL HIGH (ref 0.0–0.2)
Indirect Bilirubin: 1.7 mg/dL — ABNORMAL HIGH (ref 0.3–0.9)
Total Bilirubin: 3.4 mg/dL — ABNORMAL HIGH (ref 0.3–1.2)
Total Protein: 4.2 g/dL — ABNORMAL LOW (ref 6.5–8.1)

## 2020-12-30 LAB — MRSA NEXT GEN BY PCR, NASAL: MRSA by PCR Next Gen: NOT DETECTED

## 2020-12-30 LAB — SEDIMENTATION RATE: Sed Rate: 12 mm/hr (ref 0–16)

## 2020-12-30 LAB — LACTIC ACID, PLASMA: Lactic Acid, Venous: 3.2 mmol/L (ref 0.5–1.9)

## 2020-12-30 LAB — ECHOCARDIOGRAM COMPLETE
AR max vel: 1.89 cm2
AV Peak grad: 10.4 mmHg
Ao pk vel: 1.61 m/s
Area-P 1/2: 3.05 cm2
Calc EF: 53.9 %
S' Lateral: 2.7 cm
Single Plane A2C EF: 58.5 %
Single Plane A4C EF: 52.2 %
Weight: 2719.59 oz

## 2020-12-30 LAB — TRIGLYCERIDES: Triglycerides: 313 mg/dL — ABNORMAL HIGH (ref <150)

## 2020-12-30 LAB — C-REACTIVE PROTEIN: CRP: 4.6 mg/dL — ABNORMAL HIGH (ref <1.0)

## 2020-12-30 LAB — PROTIME-INR
INR: 1.5 — ABNORMAL HIGH (ref 0.8–1.2)
Prothrombin Time: 17.9 seconds — ABNORMAL HIGH (ref 11.4–15.2)

## 2020-12-30 LAB — PHOSPHORUS
Phosphorus: 7.4 mg/dL — ABNORMAL HIGH (ref 2.5–4.6)
Phosphorus: 7.5 mg/dL — ABNORMAL HIGH (ref 2.5–4.6)

## 2020-12-30 LAB — AMMONIA: Ammonia: 73 umol/L — ABNORMAL HIGH (ref 9–35)

## 2020-12-30 LAB — PROCALCITONIN
Procalcitonin: 5.44 ng/mL
Procalcitonin: 7.58 ng/mL

## 2020-12-30 LAB — GLUCOSE, CAPILLARY
Glucose-Capillary: 332 mg/dL — ABNORMAL HIGH (ref 70–99)
Glucose-Capillary: 313 mg/dL — ABNORMAL HIGH (ref 70–99)
Glucose-Capillary: 294 mg/dL — ABNORMAL HIGH (ref 70–99)
Glucose-Capillary: 194 mg/dL — ABNORMAL HIGH (ref 70–99)
Glucose-Capillary: 135 mg/dL — ABNORMAL HIGH (ref 70–99)

## 2020-12-30 LAB — MAGNESIUM
Magnesium: 1.9 mg/dL (ref 1.7–2.4)
Magnesium: 1.9 mg/dL (ref 1.7–2.4)

## 2020-12-30 LAB — LACTATE DEHYDROGENASE: LDH: 1235 U/L — ABNORMAL HIGH (ref 98–192)

## 2020-12-30 LAB — CK: Total CK: 357 U/L (ref 49–397)

## 2020-12-30 SURGERY — ESOPHAGOGASTRODUODENOSCOPY (EGD) WITH PROPOFOL
Anesthesia: Moderate Sedation

## 2020-12-30 MED ORDER — INSULIN GLARGINE-YFGN 100 UNIT/ML ~~LOC~~ SOLN
10.0000 [IU] | Freq: Every day | SUBCUTANEOUS | Status: DC
  Administered 2020-12-30: 10:00:00 10 [IU] via SUBCUTANEOUS
  Filled 2020-12-30 (×2): qty 0.1

## 2020-12-30 MED ORDER — MIDAZOLAM HCL (PF) 5 MG/ML IJ SOLN
INTRAMUSCULAR | Status: AC
  Filled 2020-12-30: qty 2

## 2020-12-30 MED ORDER — FENTANYL CITRATE (PF) 100 MCG/2ML IJ SOLN
INTRAMUSCULAR | Status: AC
  Filled 2020-12-30: qty 4

## 2020-12-30 MED ORDER — LACTATED RINGERS IV BOLUS
500.0000 mL | Freq: Once | INTRAVENOUS | Status: AC
  Administered 2020-12-30: 10:00:00 500 mL via INTRAVENOUS

## 2020-12-30 MED ORDER — VANCOMYCIN HCL 1250 MG/250ML IV SOLN
1250.0000 mg | INTRAVENOUS | Status: DC
  Filled 2020-12-30: qty 250

## 2020-12-30 MED ORDER — SODIUM CHLORIDE 0.9 % IV SOLN
2.0000 g | INTRAVENOUS | Status: DC
  Administered 2020-12-30: 10:00:00 2 g via INTRAVENOUS
  Filled 2020-12-30: qty 2

## 2020-12-30 MED ORDER — INSULIN ASPART 100 UNIT/ML IJ SOLN
0.0000 [IU] | INTRAMUSCULAR | Status: DC
  Administered 2020-12-30: 17:00:00 3 [IU] via SUBCUTANEOUS
  Administered 2020-12-30 (×2): 11 [IU] via SUBCUTANEOUS
  Administered 2020-12-30: 13:00:00 8 [IU] via SUBCUTANEOUS
  Administered 2020-12-30: 20:00:00 2 [IU] via SUBCUTANEOUS
  Administered 2021-01-01: 12:00:00 5 [IU] via SUBCUTANEOUS
  Administered 2021-01-01: 05:00:00 3 [IU] via SUBCUTANEOUS
  Administered 2021-01-01: 20:00:00 5 [IU] via SUBCUTANEOUS
  Administered 2021-01-01: 16:00:00 8 [IU] via SUBCUTANEOUS
  Administered 2021-01-02: 3 [IU] via SUBCUTANEOUS
  Administered 2021-01-02 (×2): 2 [IU] via SUBCUTANEOUS
  Administered 2021-01-02: 5 [IU] via SUBCUTANEOUS
  Administered 2021-01-03: 3 [IU] via SUBCUTANEOUS
  Administered 2021-01-03 (×2): 2 [IU] via SUBCUTANEOUS
  Administered 2021-01-03: 3 [IU] via SUBCUTANEOUS
  Administered 2021-01-03 – 2021-01-04 (×2): 2 [IU] via SUBCUTANEOUS
  Administered 2021-01-04 – 2021-01-05 (×6): 3 [IU] via SUBCUTANEOUS
  Administered 2021-01-05: 5 [IU] via SUBCUTANEOUS
  Administered 2021-01-05 (×2): 3 [IU] via SUBCUTANEOUS
  Administered 2021-01-06: 5 [IU] via SUBCUTANEOUS
  Administered 2021-01-06 (×2): 3 [IU] via SUBCUTANEOUS
  Administered 2021-01-06: 2 [IU] via SUBCUTANEOUS
  Administered 2021-01-06 (×2): 3 [IU] via SUBCUTANEOUS
  Administered 2021-01-07: 2 [IU] via SUBCUTANEOUS

## 2020-12-30 MED ORDER — LACTATED RINGERS IV SOLN: INTRAVENOUS | Status: DC

## 2020-12-30 MED ORDER — SODIUM CHLORIDE 0.9 % IV SOLN
50.0000 ug/h | INTRAVENOUS | Status: AC
  Administered 2020-12-30 – 2021-01-02 (×7): 50 ug/h via INTRAVENOUS
  Filled 2020-12-30 (×8): qty 1

## 2020-12-30 MED ORDER — PERFLUTREN LIPID MICROSPHERE
1.0000 mL | INTRAVENOUS | Status: AC | PRN
Start: 2020-12-30 — End: 2020-12-30
  Administered 2020-12-30: 10:00:00 2 mL via INTRAVENOUS
  Filled 2020-12-30: qty 10

## 2020-12-30 MED ORDER — SODIUM ZIRCONIUM CYCLOSILICATE 5 G PO PACK
5.0000 g | PACK | Freq: Once | ORAL | Status: AC
  Administered 2020-12-30: 10:00:00 5 g
  Filled 2020-12-30: qty 1

## 2020-12-30 SURGICAL SUPPLY — 15 items

## 2020-12-30 NOTE — Progress Notes (Signed)
Esophageal bands placed by MD Fuller Plan during EGD. OG tube was removed by MD Fuller Plan during procedure. Per MD Fuller Plan, OG tube not to be reinserted due to esophageal banding. Budd Palmer, RN notified.

## 2020-12-30 NOTE — Progress Notes (Signed)
Carotid duplex  has been completed. Refer to [facility] under chart review to view preliminary results.   12/30/2020  3:04 PM Dalton Miller, Bonnye Fava

## 2020-12-30 NOTE — Consult Note (Addendum)
Consultation  Referring Provider:  Dr Mannam/CCM Primary Care Physician:  [facility] Primary Gastroenterologist:  none/unassigned  Reason for Consultation:  Acute GI bleed  HPI: Dalton Miller is a 75 y.o. male, who was admitted yesterday with altered mental status after a fall at home.  He had been found unresponsive at home.  On arrival to the ER he was hypoxic and hypotensive with blood pressure 74/50 and was intubated.  CT of the head showed an acute right MCA stroke.  He has a left hemiparesis/plegia According to the notes patient had been noticing some dark stools at home over the past week or 2 and had complained of some abdominal discomfort the day before admission. He had undergone a recent shoulder surgery and had been taking naproxen twice daily and is also on baby aspirin daily.  Patient has not been anticoagulated as felt increased risk for hemorrhagic conversion of the CVA.  MRI is pending  NG tube was placed in the emergency room and he had immediate return of 500 cc of coffee-ground material.  He has continued to pass coffee ground appearing effluent from the NG tube.  No reports of melena today. Initial hemoglobin was 7.1 down from a hemoglobin of 14.9 on 12/14/2020.  He has been transfused and most recent hemoglobin stable at 9.6, platelets 266. WBC on admission 40.7, WBC today 26.6 BUN 108/creatinine 2.47/INR 1.5. Patient COVID-positive on admission Ammonia 115 On admit T bili 3.6/AST 324/ALT 229/alk phos 261 Today T bili 3.4/alk phos 176/AST 1186/ALT 735  Patient known very remotely to Dr. Deatra Ina with colonoscopy in 2012 showing 1 2 mm cecal AVM which was treated with APC and was otherwise a negative exam.  No prior EGD. Patient has multiple comorbidities, history of metastatic melanoma to the lung, renal cell CA, status post nephrectomy ,adult onset diabetes mellitus, hypertension, and history of kidney stones. He has been maintained on Promacta for  thrombocytopenia which his family says has been an issue ever since he had chemotherapy.  He is followed by the [facility] for this.  Upper abdominal ultrasound this morning shows no evidence of cirrhosis, no gallstones, CBD 4.2 mm    Past Medical History:  Diagnosis Date   Acquired renal cyst of right kidney    Arthritis    Cancer (HCC)    renal cell    Erectile dysfunction    Heart murmur    CHILDHOOD   Hematuria    left ureteral bleeding   History of cellulitis    2012 left wrist   History of kidney stones    Hyperlipidemia    Hypertension    Metastatic melanoma to lung, left ([place])    Nephrolithiasis    bilateral non-obstructive per ct 07-02-2015   Type 2 diabetes mellitus ([place])    Wears glasses     Past Surgical History:  Procedure Laterality Date   CARPAL TUNNEL RELEASE Right 1990  approx   COLONOSCOPY  03/04/2010   CYSTOSCOPY W/ RETROGRADES Left 10/28/2015   Procedure: CYSTOSCOPY WITH LEFT RETROGRADE PYELOGRAM;  Surgeon: Cleon Gustin, MD;  Location: [facility];  Service: Urology;  Laterality: Left;   CYSTOSCOPY W/ URETERAL STENT PLACEMENT Left 10/28/2015   Procedure: CYSTOSCOPY WITH LEFT URETERAL STENT EXCHANGE;  Surgeon: Cleon Gustin, MD;  Location: [facility];  Service: Urology;  Laterality: Left;   CYSTOSCOPY WITH RETROGRADE PYELOGRAM, URETEROSCOPY AND STENT PLACEMENT Bilateral 09/17/2015   Procedure: CYSTOSCOPY WITH BILATERAL RETROGRADE PYELOGRAM, LEFT URETEROSCOPY WITH URETERAL BIOPSY AND  STENT PLACEMENT;  Surgeon: Irine Seal, MD;  Location: [facility];  Service: Urology;  Laterality: Bilateral;   HOLMIUM LASER APPLICATION Left 54/62/7035   Procedure: HOLMIUM LASER APPLICATION;  Surgeon: Irine Seal, MD;  Location: [facility];  Service: Urology;  Laterality: Left;   KNEE ARTHROSCOPY Right 2008   MELANOMA EXCISION  2018   Left arm   right total knee replacement  Right    2009   ROBOT ASSITED LAPAROSCOPIC  NEPHROURETERECTOMY Left 12/04/2015   Procedure: XI ROBOT ASSITED LAPAROSCOPIC NEPHROURETERECTOMY;  Surgeon: Cleon Gustin, MD;  Location: [facility];  Service: Urology;  Laterality: Left;   SHOULDER ARTHROSCOPY Right 12/17/2020   Procedure: Right shoulder arthroscopy, debridement, subacromial decompression, distal clavicle resection;  Surgeon: Justice Britain, MD;  Location: [facility];  Service: Orthopedics;  Laterality: Right;  7mn   STONE EXTRACTION WITH BASKET Left 09/17/2015   Procedure: STONE EXTRACTION WITH BASKET;  Surgeon: JIrine Seal MD;  [facility]  Service: Urology;  Laterality: Left;   TOTAL KNEE ARTHROPLASTY Right 2009   URETEROSCOPY  10/28/2015   Procedure: DIAGNOSTIC LEFT URETEROSCOPY;  Surgeon: PCleon Gustin MD;  Location: [facility];  Service: Urology;;   WISDOM TOOTH EXTRACTION      Prior to Admission medications   Medication Sig Start Date End Date Taking? Authorizing Provider  amLODipine (NORVASC) 2.5 MG tablet Take 2.5 mg by mouth daily. 03/04/20  Yes [provider]  carboxymethylcellulose (REFRESH PLUS) 0.5 % SOLN Place 1 drop into both eyes in the morning, at noon, and at bedtime. 04/24/20  Yes [provider]  cyclobenzaprine (FLEXERIL) 10 MG tablet Take 1 tablet (10 mg total) by mouth 3 (three) times daily as needed for muscle spasms. 12/17/20  Yes Shuford, TOlivia Mackie PA-C  eltrombopag (PROMACTA) 25 MG tablet Take 25 mg by mouth daily. 04/08/20  Yes [provider]  loratadine (CLARITIN) 10 MG tablet Take 10 mg by mouth daily.   Yes [provider]  naproxen (NAPROSYN) 500 MG tablet Take 1 tablet (500 mg total) by mouth 2 (two) times daily with a meal. 12/17/20  Yes Shuford, TOlivia Mackie PA-C  oxyCODONE (OXY IR/ROXICODONE) 5 MG immediate release tablet Take 5 mg by mouth every 6 (six) hours as needed. 12/24/20  Yes [provider]  pregabalin (LYRICA) 75 MG capsule Take 75 mg by mouth 2 (two) times daily.  02/28/20  Yes [provider]  WJay SchlichterOil (REFRESH P.M. OP) Place 1 application into both eyes at bedtime.   Yes [provider]  aspirin EC 81 MG tablet Take 1 tablet (81 mg total) by mouth daily. Patient not taking: Reported on 12/09/2020 03/22/16   HEvelina DunA, FNP  atorvastatin (LIPITOR) 10 MG tablet Take 1 tablet (10 mg total) by mouth daily. Patient not taking: Reported on 12/09/2020 05/20/19   HSharion Balloon FNP  fluticasone ([facility] 50 MCG/ACT nasal spray SPRAY 2 SPRAYS INTO EACH NOSTRIL EVERY DAY Patient not taking: Reported on 12/09/2020 05/20/19   HEvelina DunA, FNP  lisinopril (ZESTRIL) 10 MG tablet TAKE 1 TABLET (10 MG TOTAL) BY MOUTH DAILY. NEEDS TO BE SEEN BEFORE NEXT REFILL. Patient not taking: Reported on 12/09/2020 06/10/19   HEvelina DunA, FNP  ondansetron (ZOFRAN) 4 MG tablet Take 1 tablet (4 mg total) by mouth every 8 (eight) hours as needed for nausea or vomiting. Patient not taking: Reported on 12/29/2020 12/17/20   Shuford, TOlivia Mackie PA-C  oxyCODONE-acetaminophen (PERCOCET) 5-325 MG tablet  Take 1 tablet by mouth every 4 (four) hours as needed (max 6 q). Patient not taking: Reported on 12/29/2020 12/17/20   Shuford, Olivia Mackie, PA-C    Current Facility-Administered Medications  Medication Dose Route Frequency Provider Last Rate Last Admin   0.9 %  sodium chloride infusion  10 mL/hr Intravenous Once Daleen Bo, MD       ceFEPIme (MAXIPIME) 2 g in sodium chloride 0.9 % 100 mL IVPB  2 g Intravenous Q24H Mannam, Praveen, MD 200 mL/hr at 12/30/20 1000 Infusion Verify at 12/30/20 1000   chlorhexidine gluconate (MEDLINE KIT) (PERIDEX) 0.12 % solution 15 mL  15 mL Mouth Rinse BID Anders Simmonds, MD   15 mL at 12/30/20 0350   Chlorhexidine Gluconate Cloth 2 % PADS 6 each  6 each Topical Daily Anders Simmonds, MD   6 each at 12/29/20 2315   fentaNYL (SUBLIMAZE) bolus via infusion 25-100 mcg  25-100 mcg Intravenous Q15 min PRN Corey Harold, NP       fentaNYL (SUBLIMAZE) injection 25 mcg  25 mcg Intravenous Once Corey Harold, NP       fentaNYL 2513mg in NS 2539m(1036mml) infusion-PREMIX  25-200 mcg/hr Intravenous Continuous HofCorey HaroldP 2.5 mL/hr at 12/30/20 1000 25 mcg/hr at 12/30/20 1000   insulin aspart (novoLOG) injection 0-15 Units  0-15 Units Subcutaneous Q4H HofCorey HaroldP   11 Units at 12/30/20 0827   insulin glargine-yfgn (SEMGLEE) injection 10 Units  10 Units Subcutaneous Daily Mannam, Praveen, MD   10 Units at 12/30/20 [id number] ringers infusion   Intravenous Continuous Mannam, Praveen, MD 75 mL/hr at 12/30/20 0956 New Bag at 12/30/20 0956   lactulose (CHRONULAC) 10 GM/15ML solution 20 g  20 g Per Tube TID HofCorey HaroldP   20 g at 12/30/20 [id number] mouth rinse  15 mL Mouth Rinse 10 times per day SomAnders SimmondsD   15 mL at 12/30/20 0956   midazolam (VERSED) injection 1 mg  1 mg Intravenous Q15 min PRN PicDavonna BellingD   1 mg at 12/29/20 2120   midazolam (VERSED) injection 1 mg  1 mg Intravenous Q2H PRN PicDavonna BellingD       pantoprazole (PROTONIX) injection 40 mg  40 mg Intravenous Q12H HofCorey HaroldP   40 mg at 12/30/20 [id number] lipid microspheres (DEFINITY) IV suspension  1-10 mL Intravenous PRN Mannam, Praveen, MD   2 mL at 12/30/20 1016   propofol (DIPRIVAN) 1000 MG/100ML infusion  0-50 mcg/kg/min Intravenous Continuous HofCorey HaroldP 8.86 mL/hr at 12/30/20 0800 20 mcg/kg/min at 12/30/20 0800   [START ON 12/31/2020] vancomycin (VANCOREADY) IVPB 1250 mg/250 mL  1,250 mg Intravenous Q48H Dang, Thuy D, RPH        Allergies as of 12/29/2020   (No Known Allergies)    Family History  Problem Relation Age of Onset   Diabetes Mother    Heart disease Mother    Stroke Mother    CAD Mother    Diabetes Father    Congestive Heart Failure Father    Cancer Brother    Heart disease Son    COPD Son    Emphysema Son    Heart  attack Brother     Social History   Socioeconomic History   Marital status: Widowed    Spouse name: Not on file   Number of children: 3   Years of education: 8  Highest education level: 8th grade  Occupational History   Occupation: [employer] in [place]: 33 years  Tobacco Use   Smoking status: Every Day    Packs/day: 1.50    Years: 63.00    Pack years: 94.50    Types: Cigars, Cigarettes   Smokeless tobacco: Former    Types: Chew    Quit date: 01/03/1993   Tobacco comments:    Quit smoking cigarettes over 10 years ago- smoke 1.5 pack per day 60 years  Vaping Use   Vaping Use: Never used  Substance and Sexual Activity   Alcohol use: No   Drug use: No   Sexual activity: Never  Other Topics Concern   Not on file  Social History Narrative   Not on file   Social Determinants of Health   Financial Resource Strain: Low Risk    Difficulty of Paying Living Expenses: Not hard at all  Food Insecurity: No Food Insecurity   Worried About Charity fundraiser in the Last Year: Never true   [place] in the Last Year: Never true  Transportation Needs: No Transportation Needs   Lack of Transportation (Medical): No   Lack of Transportation (Non-Medical): No  Physical Activity: Inactive   Days of Exercise per Week: 0 days   Minutes of Exercise per Session: 0 min  Stress: No Stress Concern Present   Feeling of Stress : Not at all  Social Connections: Socially Isolated   Frequency of Communication with Friends and Family: More than three times a week   Frequency of Social Gatherings with Friends and Family: More than three times a week   Attends Religious Services: Never   Marine scientist or Organizations: No   Attends Archivist Meetings: Never   Marital Status: Widowed  Human resources officer Violence: Not At Risk   Fear of Current or Ex-Partner: No   Emotionally Abused: No   Physically Abused: No    Sexually Abused: No    Review of Systems: Pertinent positive and negative review of systems were noted in the above HPI section.  All other review of systems was otherwise negative.  Physical Exam: Vital signs in last 24 hours: Temp:  [85.6 F (29.8 C)-98.3 F (36.8 C)] 98.3 F (36.8 C) (12/27 2240) Pulse Rate:  [77-110] 84 (12/28 1000) Resp:  [0-32] 21 (12/28 1000) BP: (88-164)/(51-102) 108/65 (12/28 1000) SpO2:  [100 %] 100 % (12/28 1000) FiO2 (%):  [40 %-100 %] 40 % (12/28 0837) Weight:  [77.1 kg] 77.1 kg (12/28 0425)   General:    Well-developed, elderly white male, intubated, sedated, NG in place with coffee-ground appearing effluent Head:  Normocephalic and atraumatic. Eyes:  Sclera clear, no icterus.   Conjunctiva piale Ears:  Normal auditory acuity. Nose:  No deformity, discharge,  or lesions. Mouth:  No deformity or lesions.   Neck:  Supple; no masses or thyromegaly. Lungs:  Clear throughout to auscultation.   No wheezes, crackles, or rhonchi.  Heart:  Regular rate and rhythm; no murmurs, clicks, rubs,  or gallops. Abdomen:  Soft,nontender, BS active,nonpalp mass or hsm.   Rectal: Not done, coffee-ground effluent present in NG Msk:  Symmetrical without gross deformities. . Pulses:  Normal pulses noted. Extremities:  Without clubbing or edema. Neurologic: Sedated Skin:  Intact without significant lesions or rashes.. Psych: Sedated  Intake/Output from previous day: 12/27 0701 - 12/28 0700 In: 4016.7 [I.V.:2248.1; Blood:670; IV Piggyback:1098.6] Out: 320 [Urine:320] Intake/Output  this shift: Total I/O In: 41 [I.V.:27.1; IV Piggyback:14] Out: -   Lab Results: Recent Labs    12/29/20 0908 12/29/20 2352 12/30/20 0006 12/30/20 0009 12/30/20 0356 12/30/20 0421  WBC 40.7*  --  26.6*  --   --  27.4*  HGB 7.1*   < > 9.5* 9.2* 9.2* 9.6*  HCT 23.2*   < > 27.3* 27.0* 27.0* 26.8*  PLT 539*  --  266  --   --  253   < > = values in this interval not displayed.    BMET Recent Labs    12/29/20 0908 12/29/20 2352 12/30/20 0006 12/30/20 0009 12/30/20 0356 12/30/20 0421  NA 141   < > 137 136 136 140  K 4.8   < > 5.0 4.8 5.1 5.5*  CL 108  --  106  --   --  104  CO2 9*  --  16*  --   --  18*  GLUCOSE 284*  --  398*  --   --  375*  BUN 95*  --  108*  --   --  114*  CREATININE 1.77*  --  2.47*  --   --  2.64*  CALCIUM 8.6*  --  7.1*  --   --  7.0*   < > = values in this interval not displayed.   LFT Recent Labs    12/30/20 0006  PROT 4.2*  ALBUMIN 2.1*  AST 1,186*  ALT 735*  ALKPHOS 176*  BILITOT 3.4*  BILIDIR 1.7*  IBILI 1.7*   PT/INR Recent Labs    12/30/20 0006  LABPROT 17.9*  INR 1.5*   Hepatitis Panel Recent Labs    12/30/20 0006  HEPBSAG NON REACTIVE  HCVAB NON REACTIVE  HEPAIGM NON REACTIVE  HEPBIGM NON REACTIVE     IMPRESSION:  #31  75 year old white male admitted yesterday with acute right MCA stroke with left hemiparesis Patient hypoxic and hypotensive on admission, intubated  #2 acute COVID-19 infection-with suspected sepsis #3 acute/subacute GI bleeding with coffee-ground emesis.  Patient had apparently reported black stools at home recently but this is in the setting of twice daily naproxen and aspirin  Suspect acute upper GI bleed secondary to erosive gastropathy versus peptic ulcer disease  #4 anemia acute/subacute secondary to above with having gram drop in hemoglobin over the past 2 weeks #5 elevated LFTs-US negative for cirrhosis, no evidence of cholelithiasis or choledocholithiasis-this may be reactive secondary to sepsis and/or secondary to acute COVID-19 hepatitis  #6 acute metabolic encephalopathy 7 acute kidney injury-solitary kidney with history of renal cell cancer #8 history of metastatic melanoma to the lung #9 adult onset diabetes mellitus #10  Hypertension #11 chronic thrombocytopenia on chronic Promacta as outpatient  Plan:  Keep n.p.o. Currently on IV PPI twice daily Trend  hemoglobin every 6 to 8 hours and transfuse to keep hemoglobin 8 Resume Promacta Trend LFTs and follow INR Patient will be scheduled for EGD at bedside with Dr. Fuller Plan today.  Procedure was discussed in detail with the patient's daughter per phone.  We discussed indications risks and benefits and family agreeable to proceed.  GI will follow with you   Amy Esterwood PA-C 12/30/2020, 10:24 AM     Attending Physician Note   I have taken a history, reviewed the chart and examined the patient. I personally saw the patient and performed a substantive portion of this encounter, including a complete performance of at least one of the key components, in conjunction with the  APP. I agree with the APP's note, impression and recommendations.   *UGI bleed with CGE and melena on NSAIDs and acute/subacute anemia. R/O ulcer, gastritis, duodenitis. IV PPI bid. Trend CBC. EGD today.   *Acute right MCA stroke with left hemiparesis in setting of Covid infection  *Elevated LFTs, etiology unclear. Suspect shock liver or Covid related. Trend LFTs and PT/INR.   Lucio Edward, MD [facility] See AM[facility], [facility], for our on call provider

## 2020-12-30 NOTE — Op Note (Addendum)
[facility] Patient Name: Dalton Miller Procedure Date : 12/30/2020 MRN: [id number] Attending MD: Ladene Artist , MD Date of Birth: [birth year] CSN: [id number] Age: 75 Admit Type: Inpatient Procedure:                Upper GI endoscopy Indications:              Coffee-ground emesis, Melena Providers:                Pricilla Riffle. Fuller Plan, MD, Dulcy Fanny, Lodema Hong Technician, Technician Referring MD:             CCM Medicines:                None Complications:            No immediate complications. Estimated Blood Loss:     Estimated blood loss: none. Procedure:                Pre-Anesthesia Assessment:                           - Prior to the procedure, a History and Physical                            was performed, and patient medications and                            allergies were reviewed. The patient's tolerance of                            previous anesthesia was also reviewed. The risks                            and benefits of the procedure and the sedation                            options and risks were discussed with the patient.                            All questions were answered, and informed consent                            was obtained. Prior Anticoagulants: The patient has                            taken no previous anticoagulant or antiplatelet                            agents. ASA Grade Assessment: II - A patient with                            mild systemic disease. After reviewing the risks  and benefits, the patient was deemed in                            satisfactory condition to undergo the procedure.                           After obtaining informed consent, the endoscope was                            passed under direct vision. Throughout the                            procedure, the patient's blood pressure, pulse, and                            oxygen saturations were  monitored continuously. The                            [id number]-[id number] ([id number]) Olympus endoscope was introduced                            through the mouth, and advanced to the second part                            of duodenum. The upper GI endoscopy was                            accomplished without difficulty. The patient                            tolerated the procedure well. Scope In: Scope Out: Findings:      Grade II varices were found in the distal esophagus. They were 5 mm in       largest diameter. One varix with an erosion and one with a red spot.       Four bands were successfully placed with complete eradication, resulting       in deflation of varices. There was no bleeding during the procedure.      The exam of the esophagus was otherwise normal.      Red blood clots were found in the gastric fundus.      The exam of the stomach was otherwise normal.      The duodenal bulb and second portion of the duodenum were normal. Impression:               - Grade II esophageal varices. Completely                            eradicated. Banded.                           - Red blood clots in the gastric fundus.                           - Normal duodenal bulb and second portion of the  duodenum.                           - No specimens collected. Recommendation:           - Remain in ICU for ongoing care.                           - Continue current medications.                           - CT [facility] when patient is medically stable.                           - Octreotide bolus and infusion.                           - Trend CBC. Procedure Code(s):        --- Professional ---                           [phone], Esophagogastroduodenoscopy, flexible,                            transoral; with band ligation of esophageal/gastric                            varices Diagnosis Code(s):        --- Professional ---                           I85.00, Esophageal varices without  bleeding                           K92.2, Gastrointestinal hemorrhage, unspecified                           K92.0, Hematemesis                           K92.1, Melena (includes Hematochezia) CPT copyright 2019 American Medical Association. All rights reserved. The codes documented in this report are preliminary and upon coder review may  be revised to meet current compliance requirements. Ladene Artist, MD 12/30/2020 2:39:41 PM This report has been signed electronically. Number of Addenda: 0

## 2020-12-30 NOTE — Progress Notes (Addendum)
NAME:  Dalton Miller, MRN:  [id number], DOB:  1945-04-05, LOS: 1 ADMISSION DATE:  12/29/2020, CONSULTATION DATE:  12/29/20 REFERRING MD:  Eulis Foster - [facility], CHIEF COMPLAINT:  CVA and GIB    History of Present Illness:   75 yo with complex PMH including metastatic melanoma, lung cancer, urothelial carcinoma, ongoing tobacco use disorder,  thrombocytopenia (felt to be immunotherapy related ITP), recent R shoulder surgery for impingement + OA (12/17/20), presented to [place] 12/27 with AMS after possible fall  Pt LKW 12/26 around 2100. Found 12/27 around 0600, unresponsive beside toilet. It is not known how long the patient was downed. He was reportedly pale and yellow appearing. Taken to [facility] where he was noted to be hypoxic SpO2 70s, hypotensive 74/50. Family states that 12/26 pt complained of upper GI pain and had a black stool.   Pt was obtunded in ED and required intubation. CT H obtained and revealed acute R MCA CVA. OGT place and promptly yielded >500 ml coffee ground emesis. His hgb is 7.1 in the ED -- received 1 PRBC. His COVID test has also resulted positive.   PCCM consulted for admission in this setting   Pertinent  Medical History  Metastatic melanoma , left lung  Recurrent urothelial cancer DM2 HTN HLD Emphysema  S/p nephrectomy   Significant Hospital Events: Including procedures, antibiotic start and stop dates in addition to other pertinent events   12/27 [facility] for AMS. Obtunded, intubated. R MCA stroke, GIB, AKI, Acute liver injury. Transferring to [facility].  COVID PCR is positive  Interim History / Subjective:   No acute events.   Objective   Blood pressure 97/65, pulse 83, temperature 98.3 F (36.8 C), resp. rate (!) 23, weight 77.1 kg, SpO2 100 %.    Vent Mode: PRVC FiO2 (%):  [40 %-100 %] 40 % Set Rate:  [22 bmp] 22 bmp Vt Set:  [530 mL] 530 mL PEEP:  [5 cmH20] 5 cmH20 Plateau Pressure:  [15 cmH20-16 cmH20] 15 cmH20   Intake/Output Summary (Last 24 hours) at 12/30/2020  2536 Last data filed at 12/30/2020 0800 Gross per 24 hour  Intake 4040.21 ml  Output 320 ml  Net 3720.21 ml   Filed Weights   12/30/20 0425  Weight: 77.1 kg    Examination: Gen:      No acute distress HEENT:  EOMI, sclera anicteric Neck:     No masses; no thyromegaly Lungs:    Clear to auscultation bilaterally; normal respiratory effort CV:         Regular rate and rhythm; no murmurs Abd:      + bowel sounds; soft, non-tender; no palpable masses, no distension Ext:    No edema; adequate peripheral perfusion Skin:      Warm and dry; no rash Neuro: Sedated  Labs/imaging reviewed Significant for potassium 5.5, creatinine 2.64, phosphorus 7.4, ammonia 73 WBC 27.4, hemoglobin 9.6, platelets 253 No new imaging  Resolved Hospital Problem list     Assessment & Plan:  Acute R MCA territory infarct  -involving R basal ganglia, R frontal lobe  -notably, is typically thrombocytopenic but on admission plt are >500  P Appreciate neurology input.  MRI pending Follow echocardiogram  Acute metabolic encephalopathy: multifactorial  -hyperammonemia (115) , acute liver injury, AKI, sepsis, stroke, hypoxia  P Continue lactulose, propofol fentanyl for sedation  Acute respiratory failure with hypoxia -POA COVID-19 infection -POA P Suspect COVID-19 infection is incidental as chest x-ray appears clear. Cannot get remdesivir and molnupiravir due to  liver failure. Paxlovid cannot be crushed in feeding tube Supportive care Steroids if he deteriorates  GIB, acute blood loss anemia P Transfused on 12/27 Follow CBC GI consult  Suspected severe sepsis -POA. Source unclear.  -WBC 40.7. Possible component of hemoconcentration contributing but picture is c/w severe sepsis  P Continue antibiotics.  Change Zosyn to cefepime as he has AKI Follow MRSA swab.  Can DC Vanco if negative Repeat Lactic acid levels  AKI Solitary kidney P Lokelma 1 dose Follow urine output and  creatinine  Acute liver injury - POA Hyperammonemia -POA -acutely elevated LFTs. Ammonia 115 P Upper quadrant ultrasound noted with no acute abnormality Follow labs  AGMA, severe - POA > improved Lactic Acidosis -POA  -reflective of above processes  P Stop bicarb drip  Acute thrombocytosis (? 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  P Hold outpt promacta (25mg  qD)   Metastatic recurrent melanoma - POA -L upper lung lesion, mediastinal adenopathy  -s/p 4 cycles ipi/nivo, maintenance nivo dc due to ITP Recurrent low grade urothelial carcinoma s/p TURBT, repeat TURBT x2 - POA P Supportive care  Best Practice (right click and "Reselect all SmartList Selections" daily)   Diet/type: NPO DVT prophylaxis: SCD GI prophylaxis: PPI Lines: N/A Foley:  Yes, and it is still needed Code Status:  full code Last date of multidisciplinary goals of care discussion [pending. Dr. Eulis Foster at [place] did speak with family RE code status, Full code]  Critical care time:    The patient is critically ill with multiple organ system failure and requires high complexity decision making for assessment and support, frequent evaluation and titration of therapies, advanced monitoring, review of radiographic studies and interpretation of complex data.   Critical Care Time devoted to patient care services, exclusive of separately billable procedures, described in this note is 45 minutes.   Marshell Garfinkel MD [place] [place] [facility] See Amion for pager  If no response to pager , please call [phone] until 7pm After 7:00 pm call Elink  [phone] 12/30/2020, 8:40 AM

## 2020-12-30 NOTE — H&P (View-Only) (Signed)
Consultation  Referring Provider:  Dr Mannam/CCM Primary Care Physician:  [facility] Primary Gastroenterologist:  none/unassigned  Reason for Consultation:  Acute GI bleed  HPI: Dalton Miller is a 75 y.o. male, who was admitted yesterday with altered mental status after a fall at home.  He had been found unresponsive at home.  On arrival to the ER he was hypoxic and hypotensive with blood pressure 74/50 and was intubated.  CT of the head showed an acute right MCA stroke.  He has a left hemiparesis/plegia According to the notes patient had been noticing some dark stools at home over the past week or 2 and had complained of some abdominal discomfort the day before admission. He had undergone a recent shoulder surgery and had been taking naproxen twice daily and is also on baby aspirin daily.  Patient has not been anticoagulated as felt increased risk for hemorrhagic conversion of the CVA.  MRI is pending  NG tube was placed in the emergency room and he had immediate return of 500 cc of coffee-ground material.  He has continued to pass coffee ground appearing effluent from the NG tube.  No reports of melena today. Initial hemoglobin was 7.1 down from a hemoglobin of 14.9 on 12/14/2020.  He has been transfused and most recent hemoglobin stable at 9.6, platelets 266. WBC on admission 40.7, WBC today 26.6 BUN 108/creatinine 2.47/INR 1.5. Patient COVID-positive on admission Ammonia 115 On admit T bili 3.6/AST 324/ALT 229/alk phos 261 Today T bili 3.4/alk phos 176/AST 1186/ALT 735  Patient known very remotely to Dr. Deatra Ina with colonoscopy in 2012 showing 1 2 mm cecal AVM which was treated with APC and was otherwise a negative exam.  No prior EGD. Patient has multiple comorbidities, history of metastatic melanoma to the lung, renal cell CA, status post nephrectomy ,adult onset diabetes mellitus, hypertension, and history of kidney stones. He has been maintained on Promacta for  thrombocytopenia which his family says has been an issue ever since he had chemotherapy.  He is followed by [facility] for this.  Upper abdominal ultrasound this morning shows no evidence of cirrhosis, no gallstones, CBD 4.2 mm    Past Medical History:  Diagnosis Date   Acquired renal cyst of right kidney    Arthritis    Cancer (HCC)    renal cell    Erectile dysfunction    Heart murmur    CHILDHOOD   Hematuria    left ureteral bleeding   History of cellulitis    2012 left wrist   History of kidney stones    Hyperlipidemia    Hypertension    Metastatic melanoma to lung, left ([place])    Nephrolithiasis    bilateral non-obstructive per ct 07-02-2015   Type 2 diabetes mellitus ([place])    Wears glasses     Past Surgical History:  Procedure Laterality Date   CARPAL TUNNEL RELEASE Right 1990  approx   COLONOSCOPY  03/04/2010   CYSTOSCOPY W/ RETROGRADES Left 10/28/2015   Procedure: CYSTOSCOPY WITH LEFT RETROGRADE PYELOGRAM;  Surgeon: Cleon Gustin, MD;  Location: [facility];  Service: Urology;  Laterality: Left;   CYSTOSCOPY W/ URETERAL STENT PLACEMENT Left 10/28/2015   Procedure: CYSTOSCOPY WITH LEFT URETERAL STENT EXCHANGE;  Surgeon: Cleon Gustin, MD;  Location: [facility];  Service: Urology;  Laterality: Left;   CYSTOSCOPY WITH RETROGRADE PYELOGRAM, URETEROSCOPY AND STENT PLACEMENT Bilateral 09/17/2015   Procedure: CYSTOSCOPY WITH BILATERAL RETROGRADE PYELOGRAM, LEFT URETEROSCOPY WITH URETERAL BIOPSY AND  STENT PLACEMENT;  Surgeon: Irine Seal, MD;  Location[facility];  Service: Urology;  Laterality: Bilateral;   HOLMIUM LASER APPLICATION Left 09/40/7680   Procedure: HOLMIUM LASER APPLICATION;  Surgeon: Irine Seal, MD;  Location: [facility];  Service: Urology;  Laterality: Left;   KNEE ARTHROSCOPY Right 2008   MELANOMA EXCISION  2018   Left arm   right total knee replacement  Right    2009   ROBOT ASSITED LAPAROSCOPIC  NEPHROURETERECTOMY Left 12/04/2015   Procedure: XI ROBOT ASSITED LAPAROSCOPIC NEPHROURETERECTOMY;  Surgeon: Cleon Gustin, MD;  Location: [facility];  Service: Urology;  Laterality: Left;   SHOULDER ARTHROSCOPY Right 12/17/2020   Procedure: Right shoulder arthroscopy, debridement, subacromial decompression, distal clavicle resection;  Surgeon: Justice Britain, MD;  Location: [facility];  Service: Orthopedics;  Laterality: Right;  10mn   STONE EXTRACTION WITH BASKET Left 09/17/2015   Procedure: STONE EXTRACTION WITH BASKET;  Surgeon: JIrine Seal MD;  [facility]  Service: Urology;  Laterality: Left;   TOTAL KNEE ARTHROPLASTY Right 2009   URETEROSCOPY  10/28/2015   Procedure: DIAGNOSTIC LEFT URETEROSCOPY;  Surgeon: PCleon Gustin MD;  Location: [facility];  Service: Urology;;   WISDOM TOOTH EXTRACTION      Prior to Admission medications   Medication Sig Start Date End Date Taking? Authorizing Provider  amLODipine (NORVASC) 2.5 MG tablet Take 2.5 mg by mouth daily. 03/04/20  Yes [provider]  carboxymethylcellulose (REFRESH PLUS) 0.5 % SOLN Place 1 drop into both eyes in the morning, at noon, and at bedtime. 04/24/20  Yes [provider]  cyclobenzaprine (FLEXERIL) 10 MG tablet Take 1 tablet (10 mg total) by mouth 3 (three) times daily as needed for muscle spasms. 12/17/20  Yes Shuford, TOlivia Mackie PA-C  eltrombopag (PROMACTA) 25 MG tablet Take 25 mg by mouth daily. 04/08/20  Yes [provider]  loratadine (CLARITIN) 10 MG tablet Take 10 mg by mouth daily.   Yes [provider]  naproxen (NAPROSYN) 500 MG tablet Take 1 tablet (500 mg total) by mouth 2 (two) times daily with a meal. 12/17/20  Yes Shuford, TOlivia Mackie PA-C  oxyCODONE (OXY IR/ROXICODONE) 5 MG immediate release tablet Take 5 mg by mouth every 6 (six) hours as needed. 12/24/20  Yes [provider]  pregabalin (LYRICA) 75 MG capsule Take 75 mg by mouth 2 (two) times daily.  02/28/20  Yes [provider]  WJay SchlichterOil (REFRESH P.M. OP) Place 1 application into both eyes at bedtime.   Yes [provider]  aspirin EC 81 MG tablet Take 1 tablet (81 mg total) by mouth daily. Patient not taking: Reported on 12/09/2020 03/22/16   HEvelina DunA, FNP  atorvastatin (LIPITOR) 10 MG tablet Take 1 tablet (10 mg total) by mouth daily. Patient not taking: Reported on 12/09/2020 05/20/19   HSharion Balloon FNP  fluticasone ([facility] 50 MCG/ACT nasal spray SPRAY 2 SPRAYS INTO EACH NOSTRIL EVERY DAY Patient not taking: Reported on 12/09/2020 05/20/19   HEvelina DunA, FNP  lisinopril (ZESTRIL) 10 MG tablet TAKE 1 TABLET (10 MG TOTAL) BY MOUTH DAILY. NEEDS TO BE SEEN BEFORE NEXT REFILL. Patient not taking: Reported on 12/09/2020 06/10/19   HEvelina DunA, FNP  ondansetron (ZOFRAN) 4 MG tablet Take 1 tablet (4 mg total) by mouth every 8 (eight) hours as needed for nausea or vomiting. Patient not taking: Reported on 12/29/2020 12/17/20   Shuford, TOlivia Mackie PA-C  oxyCODONE-acetaminophen (PERCOCET) 5-325 MG tablet  Take 1 tablet by mouth every 4 (four) hours as needed (max 6 q). Patient not taking: Reported on 12/29/2020 12/17/20   Shuford, Olivia Mackie, PA-C    Current Facility-Administered Medications  Medication Dose Route Frequency Provider Last Rate Last Admin   0.9 %  sodium chloride infusion  10 mL/hr Intravenous Once Daleen Bo, MD       ceFEPIme (MAXIPIME) 2 g in sodium chloride 0.9 % 100 mL IVPB  2 g Intravenous Q24H Mannam, Praveen, MD 200 mL/hr at 12/30/20 1000 Infusion Verify at 12/30/20 1000   chlorhexidine gluconate (MEDLINE KIT) (PERIDEX) 0.12 % solution 15 mL  15 mL Mouth Rinse BID Anders Simmonds, MD   15 mL at 12/30/20 9458   Chlorhexidine Gluconate Cloth 2 % PADS 6 each  6 each Topical Daily Anders Simmonds, MD   6 each at 12/29/20 2315   fentaNYL (SUBLIMAZE) bolus via infusion 25-100 mcg  25-100 mcg Intravenous Q15 min PRN Corey Harold, NP       fentaNYL (SUBLIMAZE) injection 25 mcg  25 mcg Intravenous Once Corey Harold, NP       fentaNYL 2556mg in NS 2525m(1065mml) infusion-PREMIX  25-200 mcg/hr Intravenous Continuous HofCorey HaroldP 2.5 mL/hr at 12/30/20 1000 25 mcg/hr at 12/30/20 1000   insulin aspart (novoLOG) injection 0-15 Units  0-15 Units Subcutaneous Q4H HofCorey HaroldP   11 Units at 12/30/20 0827   insulin glargine-yfgn (SEMGLEE) injection 10 Units  10 Units Subcutaneous Daily Mannam, Praveen, MD   10 Units at 12/30/20 [id number] ringers infusion   Intravenous Continuous Mannam, Praveen, MD 75 mL/hr at 12/30/20 0956 New Bag at 12/30/20 0956   lactulose (CHRONULAC) 10 GM/15ML solution 20 g  20 g Per Tube TID HofCorey HaroldP   20 g at 12/30/20 [id number] mouth rinse  15 mL Mouth Rinse 10 times per day SomAnders SimmondsD   15 mL at 12/30/20 0956   midazolam (VERSED) injection 1 mg  1 mg Intravenous Q15 min PRN PicDavonna BellingD   1 mg at 12/29/20 2120   midazolam (VERSED) injection 1 mg  1 mg Intravenous Q2H PRN PicDavonna BellingD       pantoprazole (PROTONIX) injection 40 mg  40 mg Intravenous Q12H HofCorey HaroldP   40 mg at 12/30/20 [id number] lipid microspheres (DEFINITY) IV suspension  1-10 mL Intravenous PRN Mannam, Praveen, MD   2 mL at 12/30/20 1016   propofol (DIPRIVAN) 1000 MG/100ML infusion  0-50 mcg/kg/min Intravenous Continuous HofCorey HaroldP 8.86 mL/hr at 12/30/20 0800 20 mcg/kg/min at 12/30/20 0800   [START ON 12/31/2020] vancomycin (VANCOREADY) IVPB 1250 mg/250 mL  1,250 mg Intravenous Q48H Dang, Thuy D, RPH        Allergies as of 12/29/2020   (No Known Allergies)    Family History  Problem Relation Age of Onset   Diabetes Mother    Heart disease Mother    Stroke Mother    CAD Mother    Diabetes Father    Congestive Heart Failure Father    Cancer Brother    Heart disease Son    COPD Son    Emphysema Son    Heart  attack Brother     Social History   Socioeconomic History   Marital status: Widowed    Spouse name: Not on file   Number of children: 3   Years of education: 8  Highest education level: 8th grade  Occupational History   Occupation: [employer] in [place]: 33 years  Tobacco Use   Smoking status: Every Day    Packs/day: 1.50    Years: 63.00    Pack years: 94.50    Types: Cigars, Cigarettes   Smokeless tobacco: Former    Types: Chew    Quit date: 01/03/1993   Tobacco comments:    Quit smoking cigarettes over 10 years ago- smoke 1.5 pack per day 60 years  Vaping Use   Vaping Use: Never used  Substance and Sexual Activity   Alcohol use: No   Drug use: No   Sexual activity: Never  Other Topics Concern   Not on file  Social History Narrative   Not on file   Social Determinants of Health   Financial Resource Strain: Low Risk    Difficulty of Paying Living Expenses: Not hard at all  Food Insecurity: No Food Insecurity   Worried About Charity fundraiser in the Last Year: Never true   [place] in the Last Year: Never true  Transportation Needs: No Transportation Needs   Lack of Transportation (Medical): No   Lack of Transportation (Non-Medical): No  Physical Activity: Inactive   Days of Exercise per Week: 0 days   Minutes of Exercise per Session: 0 min  Stress: No Stress Concern Present   Feeling of Stress : Not at all  Social Connections: Socially Isolated   Frequency of Communication with Friends and Family: More than three times a week   Frequency of Social Gatherings with Friends and Family: More than three times a week   Attends Religious Services: Never   Marine scientist or Organizations: No   Attends Archivist Meetings: Never   Marital Status: Widowed  Human resources officer Violence: Not At Risk   Fear of Current or Ex-Partner: No   Emotionally Abused: No   Physically Abused: No    Sexually Abused: No    Review of Systems: Pertinent positive and negative review of systems were noted in the above HPI section.  All other review of systems was otherwise negative.  Physical Exam: Vital signs in last 24 hours: Temp:  [85.6 F (29.8 C)-98.3 F (36.8 C)] 98.3 F (36.8 C) (12/27 2240) Pulse Rate:  [77-110] 84 (12/28 1000) Resp:  [0-32] 21 (12/28 1000) BP: (88-164)/(51-102) 108/65 (12/28 1000) SpO2:  [100 %] 100 % (12/28 1000) FiO2 (%):  [40 %-100 %] 40 % (12/28 0837) Weight:  [77.1 kg] 77.1 kg (12/28 0425)   General:    Well-developed, elderly white male, intubated, sedated, NG in place with coffee-ground appearing effluent Head:  Normocephalic and atraumatic. Eyes:  Sclera clear, no icterus.   Conjunctiva piale Ears:  Normal auditory acuity. Nose:  No deformity, discharge,  or lesions. Mouth:  No deformity or lesions.   Neck:  Supple; no masses or thyromegaly. Lungs:  Clear throughout to auscultation.   No wheezes, crackles, or rhonchi.  Heart:  Regular rate and rhythm; no murmurs, clicks, rubs,  or gallops. Abdomen:  Soft,nontender, BS active,nonpalp mass or hsm.   Rectal: Not done, coffee-ground effluent present in NG Msk:  Symmetrical without gross deformities. . Pulses:  Normal pulses noted. Extremities:  Without clubbing or edema. Neurologic: Sedated Skin:  Intact without significant lesions or rashes.. Psych: Sedated  Intake/Output from previous day: 12/27 0701 - 12/28 0700 In: 4016.7 [I.V.:2248.1; Blood:670; IV Piggyback:1098.6] Out: 320 [Urine:320] Intake/Output  this shift: Total I/O In: 41 [I.V.:27.1; IV Piggyback:14] Out: -   Lab Results: Recent Labs    12/29/20 0908 12/29/20 2352 12/30/20 0006 12/30/20 0009 12/30/20 0356 12/30/20 0421  WBC 40.7*  --  26.6*  --   --  27.4*  HGB 7.1*   < > 9.5* 9.2* 9.2* 9.6*  HCT 23.2*   < > 27.3* 27.0* 27.0* 26.8*  PLT 539*  --  266  --   --  253   < > = values in this interval not displayed.    BMET Recent Labs    12/29/20 0908 12/29/20 2352 12/30/20 0006 12/30/20 0009 12/30/20 0356 12/30/20 0421  NA 141   < > 137 136 136 140  K 4.8   < > 5.0 4.8 5.1 5.5*  CL 108  --  106  --   --  104  CO2 9*  --  16*  --   --  18*  GLUCOSE 284*  --  398*  --   --  375*  BUN 95*  --  108*  --   --  114*  CREATININE 1.77*  --  2.47*  --   --  2.64*  CALCIUM 8.6*  --  7.1*  --   --  7.0*   < > = values in this interval not displayed.   LFT Recent Labs    12/30/20 0006  PROT 4.2*  ALBUMIN 2.1*  AST 1,186*  ALT 735*  ALKPHOS 176*  BILITOT 3.4*  BILIDIR 1.7*  IBILI 1.7*   PT/INR Recent Labs    12/30/20 0006  LABPROT 17.9*  INR 1.5*   Hepatitis Panel Recent Labs    12/30/20 0006  HEPBSAG NON REACTIVE  HCVAB NON REACTIVE  HEPAIGM NON REACTIVE  HEPBIGM NON REACTIVE     IMPRESSION:  #39  75 year old white male admitted yesterday with acute right MCA stroke with left hemiparesis Patient hypoxic and hypotensive on admission, intubated  #2 acute COVID-19 infection-with suspected sepsis #3 acute/subacute GI bleeding with coffee-ground emesis.  Patient had apparently reported black stools at home recently but this is in the setting of twice daily naproxen and aspirin  Suspect acute upper GI bleed secondary to erosive gastropathy versus peptic ulcer disease  #4 anemia acute/subacute secondary to above with having gram drop in hemoglobin over the past 2 weeks #5 elevated LFTs-US negative for cirrhosis, no evidence of cholelithiasis or choledocholithiasis-this may be reactive secondary to sepsis and/or secondary to acute COVID-19 hepatitis  #6 acute metabolic encephalopathy 7 acute kidney injury-solitary kidney with history of renal cell cancer #8 history of metastatic melanoma to the lung #9 adult onset diabetes mellitus #10  Hypertension #11 chronic thrombocytopenia on chronic Promacta as outpatient  Plan:  Keep n.p.o. Currently on IV PPI twice daily Trend  hemoglobin every 6 to 8 hours and transfuse to keep hemoglobin 8 Resume Promacta Trend LFTs and follow INR Patient will be scheduled for EGD at bedside with Dr. Fuller Plan today.  Procedure was discussed in detail with the patient's daughter per phone.  We discussed indications risks and benefits and family agreeable to proceed.  GI will follow with you   Amy Esterwood PA-C 12/30/2020, 10:24 AM     Attending Physician Note   I have taken a history, reviewed the chart and examined the patient. I personally saw the patient and performed a substantive portion of this encounter, including a complete performance of at least one of the key components, in conjunction with the  APP. I agree with the APP's note, impression and recommendations.   *UGI bleed with CGE and melena on NSAIDs and acute/subacute anemia. R/O ulcer, gastritis, duodenitis. IV PPI bid. Trend CBC. EGD today.   *Acute right MCA stroke with left hemiparesis in setting of Covid infection  *Elevated LFTs, etiology unclear. Suspect shock liver or Covid related. Trend LFTs and PT/INR.   Lucio Edward, MD [facility] See AMION, [facility], for our on call provider

## 2020-12-30 NOTE — Telephone Encounter (Signed)
Phone call placed to daughter who is listed as pt contact. Discussed current hospitalization, daughter requesting to cancel all OP therapy appts at this time and they will call to resume when pt is able.    Guadelupe Sabin, OTR/L  [phone] 12/30/20

## 2020-12-30 NOTE — Progress Notes (Addendum)
STROKE TEAM PROGRESS NOTE   ATTENDING NOTE: I reviewed above note and agree with the assessment and plan. Pt was seen and examined.   75 year old male with history of DM, HTN, HLD, melanoma mets to lung, urothelia carcinoma, tobacco abuse, thrombocytopenia and recent surgery for R shoulder impingement admitted for altered mental status, falling at home, unresponsive.  Also found to have abdominal pain, black stool with coffee-ground emesis, disconjugate gaze.  ER, patient was hypoxic, hyperthermic, hypotension, jaundice.  Also found to have COVID-positive. He was intubated for airway protection.    CT showed right MCA scattered infarcts.  MRI and MRA pending.  EF 50 to 55%, carotid Doppler showed left ICA 60 to 79% stenosis, right ICA 40 to 59% stenosis.  LDL and A1c pending.  Due to black tarry stool and coffee-ground emesis, GI consulted.  Had endoscopy this morning showed esophageal varices, status post banding.  Anemia on admission hemoglobin 7.1, status post 2 unit PRBC transfusion, this morning hemoglobin 9.6.  Antiplatelet on hold given anemia and esophageal varices.    Ammonia level 115, on lactulose, repeat ammonia level 73.  Significantly elevated AST and ALT 1186/735, INR elevated 1.5, jaundice, together with esophageal varices concerning for cirrhosis. AKI with creatinine 2.47->2.64.  Significant leukocytosis WBC 26.6->27.4.  On exam, no family at bedside.  Patient intubated on sedation, not responsive, not blinking to visual threat, not tracking, doll's eyes present, positive gag and cough with weak corneal.  However, not moving extremities on pain stimulation.  Etiology for patient stroke likely due to carotid stenosis in the setting of septic shock.  Hold off antiplatelet for now due to anemia and esophageal varices.  Sepsis management per CCM.  Whenever stable, may proceed with MRI and MRA.  We will follow.  For detailed assessment and plan, please refer to above as I have made  changes wherever appropriate.   Rosalin Hawking, MD PhD Stroke Neurology 12/30/2020 9:01 PM  This patient is critically ill due to septic shock, cirrhosis, hyperammonemia, stroke, respiratory failure, GI bleeding and at significant risk of neurological worsening, death form recurrent stroke, severe sepsis, cirrhosis worsening, septic shock. This patient's care requires constant monitoring of vital signs, hemodynamics, respiratory and cardiac monitoring, review of multiple databases, neurological assessment, discussion with family, other specialists and medical decision making of high complexity. I spent 35 minutes of neurocritical care time in the care of this patient.  I discussed with Dr. Kimber Relic CCM.    INTERVAL HISTORY Patient is seen in his room with no family at the bedside.  He was found down in the bathroom by his family yesterday and EMS was called.  He was found to be hypotensive and hypoxic, and right MCA stroke was found on CT.  He was incidentally found to be COVID positive.  He was outside of the window for TNK, and the risks of thrombectomy were felt to outweigh the potential benefits.  Vitals:   12/30/20 1137 12/30/20 1200 12/30/20 1300 12/30/20 1327  BP:  99/66 (!) 85/62 96/61  Pulse: 84 84 85 85  Resp: (!) 23 (!) 22 (!) 29 (!) 25  Temp:      TempSrc:      SpO2: 100% 100% 100% 100%  Weight:       CBC:  Recent Labs  Lab 12/29/20 0908 12/29/20 2352 12/30/20 0421 12/30/20 1110  WBC 40.7*   < > 27.4* 24.8*  NEUTROABS 26.9*  --   --   --   HGB 7.1*   < >  9.6* 8.9*  HCT 23.2*   < > 26.8* 24.6*  MCV 107.4*   < > 89.3 89.8  PLT 539*   < > 253 224   < > = values in this interval not displayed.   Basic Metabolic Panel:  Recent Labs  Lab 12/30/20 0006 12/30/20 0009 12/30/20 0356 12/30/20 0421  NA 137   < > 136 140  K 5.0   < > 5.1 5.5*  CL 106  --   --  104  CO2 16*  --   --  18*  GLUCOSE 398*  --   --  375*  BUN 108*  --   --  114*  CREATININE 2.47*  --   --  2.64*   CALCIUM 7.1*  --   --  7.0*  MG 1.9  --   --  1.9  PHOS 7.5*  --   --  7.4*   < > = values in this interval not displayed.   Lipid Panel:  Recent Labs  Lab 12/30/20 0421  TRIG 313*   HgbA1c: No results for input(s): HGBA1C in the last 168 hours. Urine Drug Screen: No results for input(s): LABOPIA, COCAINSCRNUR, LABBENZ, AMPHETMU, THCU, LABBARB in the last 168 hours.  Alcohol Level  Recent Labs  Lab 12/29/20 0909  ETH <10    IMAGING past 24 hours DG CHEST PORT 1 VIEW  Result Date: 12/29/2020 CLINICAL DATA:  Intubated EXAM: PORTABLE CHEST 1 VIEW COMPARISON:  12/29/2020 FINDINGS: Single frontal view of the chest demonstrates endotracheal tube overlying tracheal air column tip midway between thoracic inlet and carina. Enteric catheter passes below diaphragm, tip excluded by collimation. Side port projects over gastric fundus. Cardiac silhouette is unremarkable. Diffuse interstitial prominence throughout the lungs without focal consolidation, effusion, or pneumothorax. No acute bony abnormalities. IMPRESSION: 1. Support devices as above. 2. Continued interstitial prominence, which may reflect mild interstitial edema. No airspace disease. Electronically Signed   By: Randa Ngo M.D.   On: 12/29/2020 23:37   ECHOCARDIOGRAM COMPLETE  Result Date: 12/30/2020    ECHOCARDIOGRAM REPORT   Patient Name:   Dalton Miller Date of Exam: 12/30/2020 Medical Rec #:  [id number]    Height:       67.0 in Accession #:    [id number]   Weight:       170.0 lb Date of Birth:  14-Oct-1945     BSA:          1.887 m Patient Age:    27 years     BP:           102/63 mmHg Patient Gender: M            HR:           89 bpm. Exam Location:  Inpatient Procedure: 2D Echo, Cardiac Doppler, Color Doppler and Intracardiac            Opacification Agent Indications:    Stroke  History:        Patient has no prior history of Echocardiogram examinations.  Sonographer:    Jyl Heinz Referring Phys: [id number] [place]   1. Left ventricular ejection fraction, by estimation, is 50 to 55%. The left ventricle has low normal function. The left ventricle has no regional wall motion abnormalities. Left ventricular diastolic parameters are consistent with Grade I diastolic dysfunction (impaired relaxation).  2. Right ventricular systolic function is normal. The right ventricular size is normal. There is normal pulmonary artery systolic pressure.  3. The mitral valve is normal in structure. Trivial mitral valve regurgitation. No evidence of mitral stenosis.  4. The aortic valve is tricuspid. There is mild calcification of the aortic valve. There is mild thickening of the aortic valve. Aortic valve regurgitation is not visualized. Aortic valve sclerosis is present, with no evidence of aortic valve stenosis.  5. The inferior vena cava is normal in size with <50% respiratory variability, suggesting right atrial pressure of 8 mmHg. FINDINGS  Left Ventricle: Left ventricular ejection fraction, by estimation, is 50 to 55%. The left ventricle has low normal function. The left ventricle has no regional wall motion abnormalities. The left ventricular internal cavity size was normal in size. There is no left ventricular hypertrophy. Left ventricular diastolic parameters are consistent with Grade I diastolic dysfunction (impaired relaxation). Normal left ventricular filling pressure. Right Ventricle: The right ventricular size is normal. No increase in right ventricular wall thickness. Right ventricular systolic function is normal. There is normal pulmonary artery systolic pressure. The tricuspid regurgitant velocity is 1.55 m/s, and  with an assumed right atrial pressure of 8 mmHg, the estimated right ventricular systolic pressure is 01.7 mmHg. Left Atrium: Left atrial size was normal in size. Right Atrium: Right atrial size was normal in size. Pericardium: There is no evidence of pericardial effusion. Mitral Valve: The mitral valve is normal in  structure. Trivial mitral valve regurgitation. No evidence of mitral valve stenosis. Tricuspid Valve: The tricuspid valve is normal in structure. Tricuspid valve regurgitation is trivial. No evidence of tricuspid stenosis. Aortic Valve: The aortic valve is tricuspid. There is mild calcification of the aortic valve. There is mild thickening of the aortic valve. Aortic valve regurgitation is not visualized. Aortic valve sclerosis is present, with no evidence of aortic valve stenosis. Aortic valve peak gradient measures 10.4 mmHg. Pulmonic Valve: The pulmonic valve was normal in structure. Pulmonic valve regurgitation is not visualized. No evidence of pulmonic stenosis. Aorta: The aortic root is normal in size and structure. Venous: The inferior vena cava is normal in size with less than 50% respiratory variability, suggesting right atrial pressure of 8 mmHg. IAS/Shunts: No atrial level shunt detected by color flow Doppler.  LEFT VENTRICLE PLAX 2D LVIDd:         4.40 cm     Diastology LVIDs:         2.70 cm     LV e' medial:    4.13 cm/s LV PW:         0.80 cm     LV E/e' medial:  14.7 LV IVS:        0.80 cm     LV e' lateral:   7.07 cm/s LVOT diam:     2.00 cm     LV E/e' lateral: 8.6 LV SV:         46 LV SV Index:   25 LVOT Area:     3.14 cm  LV Volumes (MOD) LV vol d, MOD A2C: 76.9 ml LV vol d, MOD A4C: 89.2 ml LV vol s, MOD A2C: 31.9 ml LV vol s, MOD A4C: 42.6 ml LV SV MOD A2C:     45.0 ml LV SV MOD A4C:     89.2 ml LV SV MOD BP:      44.9 ml RIGHT VENTRICLE             IVC RV Basal diam:  2.80 cm     IVC diam: 1.70 cm RV Mid diam:    2.00  cm RV S prime:     16.30 cm/s TAPSE (M-mode): 1.9 cm LEFT ATRIUM             Index        RIGHT ATRIUM           Index LA diam:        2.10 cm 1.11 cm/m   RA Area:     11.20 cm LA Vol (A2C):   33.2 ml 17.59 ml/m  RA Volume:   23.60 ml  12.51 ml/m LA Vol (A4C):   24.3 ml 12.88 ml/m LA Biplane Vol: 29.3 ml 15.53 ml/m  AORTIC VALVE AV Area (Vmax): 1.89 cm AV Vmax:         161.00 cm/s AV Peak Grad:   10.4 mmHg LVOT Vmax:      97.00 cm/s LVOT Vmean:     69.600 cm/s LVOT VTI:       0.148 m  AORTA Ao Root diam: 3.00 cm Ao Asc diam:  3.20 cm MITRAL VALVE               TRICUSPID VALVE MV Area (PHT): 3.05 cm    TR Peak grad:   9.6 mmHg MV Decel Time: 249 msec    TR Vmax:        155.00 cm/s MV E velocity: 60.90 cm/s MV A velocity: 63.60 cm/s  SHUNTS MV E/A ratio:  0.96        Systemic VTI:  0.15 m                            Systemic Diam: 2.00 cm Skeet Latch MD Electronically signed by Skeet Latch MD Signature Date/Time: 12/30/2020/1:34:13 PM    Final    US Abdomen Limited RUQ (LIVER/GB)  Result Date: 12/30/2020 CLINICAL DATA:  History of ETT. EXAM: ULTRASOUND ABDOMEN LIMITED RIGHT UPPER QUADRANT COMPARISON:  March 13, 2010 FINDINGS: Gallbladder: No gallstones are visualized. The gallbladder wall measures 4.24 mm in thickness. No sonographic Murphy sign noted by sonographer (it should be noted that the patient is intubated). Common bile duct: Diameter: 4.23 mm Liver: No focal lesion identified. Within normal limits in parenchymal echogenicity. Portal vein is patent on color Doppler imaging with normal direction of blood flow towards the liver. Other: A 2.7 cm x 2.0 cm x 2.1 cm anechoic structure is seen within the lower pole of the right kidney. No abnormal flow is noted within this region on color Doppler evaluation. IMPRESSION: 1. Gallbladder wall thickening without evidence of cholelithiasis or additional findings to suggest the presence of acute cholecystitis. 2. Simple right renal cyst. Electronically Signed   By: Virgina Norfolk M.D.   On: 12/30/2020 01:53    PHYSICAL EXAM General:  Well-developed, well-nourished intubated male in no acute distress   Neurological (on sedation with propofol): PERRL, cough and corneal reflexes present.  Doll's eyes reflex absent. Will withdraw bilateral lower extremities to noxious stimuli, no response in  BUE.  ASSESSMENT/PLAN Dalton Miller is a 76 y.o. male with history of metastatic melanoma, lung cancer, urothelia carcinoma, tobacco abuse, thrombocytopenia and recent surgery for R shoulder impingement presenting after being found down by family.  EMS was called.  He was found to be hypotensive and hypoxic, and right MCA stroke was found on CT.  He was incidentally found to be COVID positive.  He was outside of the window for TNK, and the risks of thrombectomy were felt to outweigh  the potential benefits.  Stroke:  right MCA infarct, could be due to carotid stenosis in the setting of septic shock  CT head Ill-defined hypodensity in right basal ganglia and right frontal lobe concerning for acute right MCA territory stroke CTA head & neck pending MRI  pending MRA  pending Carotid Doppler  40-59% stenosis in right ICA, 60-79% stenosis in left ICA 2D Echo EF 01-56%, grade 1 diastolic dysfunction, no atrial level shunt LDL 71 HgbA1c 7.6 VTE prophylaxis - SCDs    Diet   Diet NPO time specified   aspirin 81 mg daily prior to admission, now on No antithrombotic secondary to GI bleed Therapy recommendations:  pending Disposition:  pending  Hypertension Home meds:  lisinopril 10 mg daily Stable Permissive hypertension (OK if < 220/120) but gradually normalize in 5-7 days Long-term BP goal normotensive  Hyperlipidemia Home meds:  atorvastatin 10 mg daily, not resumed in hospital as elevated LFTs LDL 71, goal < 70 Resume lipitor once LFT normalizes   Diabetes type II Uncontrolled Home meds:  none HgbA1c 7.6, goal < 7.0 CBGs Management per CCM SSI  Other Stroke Risk Factors Advanced Age >/= 59  Cigarette smoker advised to stop smoking   Other Active Problems GI bleed S/p banding of esophageal varices Do not reinsert OG tube per GI Monitor H/H, transfuse as needed Management per primary team  Respiratory failure Septic shock Intubated due to inability to protect  airway Management per CCM.  Hospital day # [place] , MSN, AGACNP-BC [facility] See Amion for schedule and pager information 12/30/2020 3:21 PM    To contact Stroke Continuity provider, please refer to [email or web address] After hours, contact General Neurology

## 2020-12-30 NOTE — Progress Notes (Signed)
Pharmacy Antibiotic Note  Dalton Miller is a 75 y.o. male admitted on 12/29/2020 with  unknown source of infection .  Pharmacy has been consulted for vancomycin and cefepime dosing.   Renal function worsening, afebrile, WBC down to 27.4, PCT up to 7.5.  Plan: Change vanc to 1250mg  IV Q48H for AUC 512 using SCr 2.64 Zosyn >> Cefepime 2gm IV Q24H with AKI Monitor renal fxn, clinical progress, vanc levels as indicated  Weight: 77.1 kg (169 lb 15.6 oz)  Temp (24hrs), Avg:91 F (32.8 C), Min:85.6 F (29.8 C), Max:98.3 F (36.8 C)  Recent Labs  Lab 12/29/20 0908 12/29/20 1101 12/30/20 0006 12/30/20 0421  WBC 40.7*  --  26.6* 27.4*  CREATININE 1.77*  --  2.47* 2.64*  LATICACIDVEN >9.0* >9.0*  --   --      Estimated Creatinine Clearance: 23 mL/min (A) (by C-G formula based on SCr of 2.64 mg/dL (H)).    No Known Allergies  Vanc 12/27 >> Zosyn 12/27 >> 12/28 Cefepime 12/28 >>    12/27 BCx - NGTD 12/27 UCx -  12/27 Covid - positive (incidental) 12/27 MRSA PCR - negative  Adarrius Graeff D. Mina Marble, PharmD, BCPS, [place] 12/30/2020, 8:43 AM

## 2020-12-30 NOTE — Interval H&P Note (Signed)
History and Physical Interval Note:  12/30/2020 1:24 PM  Dalton Miller  has presented today for surgery, with the diagnosis of GI bleed.  The various methods of treatment have been discussed with the patient and family. After consideration of risks, benefits and other options for treatment, the patient has consented to  Procedure(s): ESOPHAGOGASTRODUODENOSCOPY (EGD) WITH PROPOFOL (N/A) as a surgical intervention.  The patient's history has been reviewed, patient examined, no change in status, stable for surgery.  I have reviewed the patient's chart and labs.  Questions were answered to the patient's satisfaction.     Pricilla Riffle. [place] Plan

## 2020-12-31 ENCOUNTER — Ambulatory Visit (HOSPITAL_COMMUNITY): Payer: No Typology Code available for payment source | Admitting: Occupational Therapy

## 2020-12-31 ENCOUNTER — Inpatient Hospital Stay (HOSPITAL_COMMUNITY): Payer: No Typology Code available for payment source

## 2020-12-31 DIAGNOSIS — T68XXXA Hypothermia, initial encounter: Secondary | ICD-10-CM

## 2020-12-31 DIAGNOSIS — J9601 Acute respiratory failure with hypoxia: Secondary | ICD-10-CM | POA: Diagnosis not present

## 2020-12-31 DIAGNOSIS — K920 Hematemesis: Secondary | ICD-10-CM | POA: Diagnosis not present

## 2020-12-31 DIAGNOSIS — Z978 Presence of other specified devices: Secondary | ICD-10-CM

## 2020-12-31 DIAGNOSIS — I8511 Secondary esophageal varices with bleeding: Secondary | ICD-10-CM | POA: Diagnosis not present

## 2020-12-31 DIAGNOSIS — R7989 Other specified abnormal findings of blood chemistry: Secondary | ICD-10-CM | POA: Diagnosis not present

## 2020-12-31 DIAGNOSIS — K7469 Other cirrhosis of liver: Secondary | ICD-10-CM

## 2020-12-31 LAB — HEMOGLOBIN A1C
Hgb A1c MFr Bld: 6.2 % — ABNORMAL HIGH (ref 4.8–5.6)
Mean Plasma Glucose: 131.24 mg/dL

## 2020-12-31 LAB — CBC
HCT: 22.2 % — ABNORMAL LOW (ref 39.0–52.0)
HCT: 21.2 % — ABNORMAL LOW (ref 39.0–52.0)
Hemoglobin: 7.9 g/dL — ABNORMAL LOW (ref 13.0–17.0)
Hemoglobin: 7.3 g/dL — ABNORMAL LOW (ref 13.0–17.0)
MCH: 32 pg (ref 26.0–34.0)
MCH: 32 pg (ref 26.0–34.0)
MCHC: 35.6 g/dL (ref 30.0–36.0)
MCHC: 34.4 g/dL (ref 30.0–36.0)
MCV: 89.9 fL (ref 80.0–100.0)
MCV: 93 fL (ref 80.0–100.0)
Platelets: 175 10*3/uL (ref 150–400)
Platelets: 164 10*3/uL (ref 150–400)
RBC: 2.47 MIL/uL — ABNORMAL LOW (ref 4.22–5.81)
RBC: 2.28 MIL/uL — ABNORMAL LOW (ref 4.22–5.81)
RDW: 16.7 % — ABNORMAL HIGH (ref 11.5–15.5)
RDW: 17.2 % — ABNORMAL HIGH (ref 11.5–15.5)
WBC: 20.1 10*3/uL — ABNORMAL HIGH (ref 4.0–10.5)
WBC: 15.3 10*3/uL — ABNORMAL HIGH (ref 4.0–10.5)
nRBC: 0 % (ref 0.0–0.2)
nRBC: 0.1 % (ref 0.0–0.2)

## 2020-12-31 LAB — GLUCOSE, CAPILLARY
Glucose-Capillary: 61 mg/dL — ABNORMAL LOW (ref 70–99)
Glucose-Capillary: 68 mg/dL — ABNORMAL LOW (ref 70–99)
Glucose-Capillary: 71 mg/dL (ref 70–99)
Glucose-Capillary: 73 mg/dL (ref 70–99)
Glucose-Capillary: 78 mg/dL (ref 70–99)
Glucose-Capillary: 79 mg/dL (ref 70–99)
Glucose-Capillary: 88 mg/dL (ref 70–99)
Glucose-Capillary: 97 mg/dL (ref 70–99)

## 2020-12-31 LAB — COMPREHENSIVE METABOLIC PANEL
ALT: 492 U/L — ABNORMAL HIGH (ref 0–44)
AST: 425 U/L — ABNORMAL HIGH (ref 15–41)
Albumin: 1.8 g/dL — ABNORMAL LOW (ref 3.5–5.0)
Alkaline Phosphatase: 100 U/L (ref 38–126)
Anion gap: 15 (ref 5–15)
BUN: 133 mg/dL — ABNORMAL HIGH (ref 8–23)
CO2: 19 mmol/L — ABNORMAL LOW (ref 22–32)
Calcium: 6.3 mg/dL — CL (ref 8.9–10.3)
Chloride: 101 mmol/L (ref 98–111)
Creatinine, Ser: 4.02 mg/dL — ABNORMAL HIGH (ref 0.61–1.24)
GFR, Estimated: 15 mL/min — ABNORMAL LOW (ref >60)
Glucose, Bld: 66 mg/dL — ABNORMAL LOW (ref 70–99)
Potassium: 5.3 mmol/L — ABNORMAL HIGH (ref 3.5–5.1)
Sodium: 135 mmol/L (ref 135–145)
Total Bilirubin: 2.6 mg/dL — ABNORMAL HIGH (ref 0.3–1.2)
Total Protein: 4 g/dL — ABNORMAL LOW (ref 6.5–8.1)

## 2020-12-31 LAB — LIPID PANEL
Cholesterol: 108 mg/dL (ref 0–200)
HDL: <10 mg/dL — ABNORMAL LOW (ref >40)
Triglycerides: 282 mg/dL — ABNORMAL HIGH (ref <150)
VLDL: 56 mg/dL — ABNORMAL HIGH (ref 0–40)

## 2020-12-31 LAB — ANA W/REFLEX IF POSITIVE: Anti Nuclear Antibody (ANA): NEGATIVE

## 2020-12-31 LAB — URINE CULTURE: Culture: NO GROWTH

## 2020-12-31 LAB — PROCALCITONIN: Procalcitonin: 5.5 ng/mL

## 2020-12-31 LAB — HAPTOGLOBIN: Haptoglobin: 78 mg/dL (ref 34–355)

## 2020-12-31 LAB — MAGNESIUM: Magnesium: 2.1 mg/dL (ref 1.7–2.4)

## 2020-12-31 LAB — PHOSPHORUS: Phosphorus: 7.8 mg/dL — ABNORMAL HIGH (ref 2.5–4.6)

## 2020-12-31 MED ORDER — LACTATED RINGERS IV BOLUS
500.0000 mL | Freq: Once | INTRAVENOUS | Status: AC
  Administered 2020-12-31: 19:00:00 500 mL via INTRAVENOUS

## 2020-12-31 MED ORDER — DEXTROSE IN LACTATED RINGERS 5 % IV SOLN
INTRAVENOUS | Status: DC
  Administered 2021-01-03: 75 mL/h via INTRAVENOUS

## 2020-12-31 MED ORDER — CALCIUM GLUCONATE-NACL 2-0.675 GM/100ML-% IV SOLN
2.0000 g | Freq: Once | INTRAVENOUS | Status: AC
  Administered 2020-12-31: 13:00:00 2000 mg via INTRAVENOUS
  Filled 2020-12-31: qty 100

## 2020-12-31 MED ORDER — SODIUM CHLORIDE 0.9 % IV SOLN
1.0000 g | INTRAVENOUS | Status: AC
  Administered 2020-12-31 – 2021-01-04 (×5): 1 g via INTRAVENOUS
  Filled 2020-12-31 (×5): qty 10

## 2020-12-31 NOTE — Progress Notes (Addendum)
STROKE TEAM PROGRESS NOTE   INTERVAL HISTORY Patient is seen in his room with no family at the bedside.  He has been slightly hypotensive and his neurological exam is unchanged.  His MRI shows scattered cortical and subcortical infarcts in right MCA distribution.  Spoke with patient's daughter Dalton Miller on the phone and gave her an update on MRI results and patient's condition.  Vitals:   12/31/20 1215 12/31/20 1230 12/31/20 1244 12/31/20 1245  BP: (!) 89/55 (!) 89/56 (!) 92/57 (!) 92/57  Pulse: 61 61 61 61  Resp: (!) 22 (!) 22 (!) 22 (!) 22  Temp:      TempSrc:      SpO2: 100% 100% 100% 100%  Weight:       CBC:  Recent Labs  Lab 12/29/20 0908 12/29/20 2352 12/30/20 1110 12/31/20 1138  WBC 40.7*   < > 24.8* 20.1*  NEUTROABS 26.9*  --   --   --   HGB 7.1*   < > 8.9* 7.9*  HCT 23.2*   < > 24.6* 22.2*  MCV 107.4*   < > 89.8 89.9  PLT 539*   < > 224 175   < > = values in this interval not displayed.    Basic Metabolic Panel:  Recent Labs  Lab 12/30/20 0421 12/31/20 1016  NA 140 135  K 5.5* 5.3*  CL 104 101  CO2 18* 19*  GLUCOSE 375* 66*  BUN 114* 133*  CREATININE 2.64* 4.02*  CALCIUM 7.0* 6.3*  MG 1.9 2.1  PHOS 7.4* 7.8*    Lipid Panel:  Recent Labs  Lab 12/31/20 1016  CHOL 108  TRIG 282*  HDL <10*  CHOLHDL NOT CALCULATED  VLDL 56*  LDLCALC NOT CALCULATED    HgbA1c: No results for input(s): HGBA1C in the last 168 hours. Urine Drug Screen: No results for input(s): LABOPIA, COCAINSCRNUR, LABBENZ, AMPHETMU, THCU, LABBARB in the last 168 hours.  Alcohol Level  Recent Labs  Lab 12/29/20 0909  ETH <10     IMAGING past 24 hours MR ANGIO HEAD WO CONTRAST  Result Date: 12/31/2020 CLINICAL DATA:  Stroke follow up. EXAM: MRI HEAD WITHOUT CONTRAST MRA HEAD WITHOUT CONTRAST TECHNIQUE: Multiplanar, multi-echo pulse sequences of the brain and surrounding structures were acquired without intravenous contrast. Angiographic images of the Circle of Willis were acquired  using MRA technique without intravenous contrast. COMPARISON:  2 days ago. FINDINGS: MRI HEAD FINDINGS Brain: Patchy restricted diffusion along the right cerebral convexity involving the frontal, parietal, and occipital cortex. Restricted diffusion and swelling at the right stratum with mild petechial hemorrhage. Mild chronic small vessel ischemia in the hemispheric white matter no hydrocephalus, shift, or collection. Vascular: See below. Skull and upper cervical spine: Normal marrow signal Sinuses/Orbits: Minor mastoid and sinus opacification in the setting of nasopharyngeal fluid. MRA HEAD FINDINGS Anterior circulation: Atheromatous irregularity of the cavernous carotids. 70% right M1 segment stenosis with wasted appearance rather than the appearance of central clot/embolism. No beading or aneurysm. Posterior circulation: The vertebral and basilar arteries are smoothly contoured and widely patent. Proximal basilar fenestration. IMPRESSION: 1. Scattered cortical and subcortical acute infarcts in the right MCA distribution with mild petechial hemorrhage. 2. 70% stenosis at the right M1 segment. Electronically Signed   By: Jorje Guild M.D.   On: 12/31/2020 07:12   MR BRAIN WO CONTRAST  Result Date: 12/31/2020 CLINICAL DATA:  Stroke follow up. EXAM: MRI HEAD WITHOUT CONTRAST MRA HEAD WITHOUT CONTRAST TECHNIQUE: Multiplanar, multi-echo pulse sequences of the brain  and surrounding structures were acquired without intravenous contrast. Angiographic images of the Circle of Willis were acquired using MRA technique without intravenous contrast. COMPARISON:  2 days ago. FINDINGS: MRI HEAD FINDINGS Brain: Patchy restricted diffusion along the right cerebral convexity involving the frontal, parietal, and occipital cortex. Restricted diffusion and swelling at the right stratum with mild petechial hemorrhage. Mild chronic small vessel ischemia in the hemispheric white matter no hydrocephalus, shift, or collection.  Vascular: See below. Skull and upper cervical spine: Normal marrow signal Sinuses/Orbits: Minor mastoid and sinus opacification in the setting of nasopharyngeal fluid. MRA HEAD FINDINGS Anterior circulation: Atheromatous irregularity of the cavernous carotids. 70% right M1 segment stenosis with wasted appearance rather than the appearance of central clot/embolism. No beading or aneurysm. Posterior circulation: The vertebral and basilar arteries are smoothly contoured and widely patent. Proximal basilar fenestration. IMPRESSION: 1. Scattered cortical and subcortical acute infarcts in the right MCA distribution with mild petechial hemorrhage. 2. 70% stenosis at the right M1 segment. Electronically Signed   By: Jorje Guild M.D.   On: 12/31/2020 07:12   DG Chest Port 1 View  Result Date: 12/31/2020 CLINICAL DATA:  Intubation, CVA EXAM: PORTABLE CHEST 1 VIEW COMPARISON:  Chest x-ray 12/29/2020 FINDINGS: Endotracheal tube tip is 4.7 cm above the carina. Enteric tube has been removed. Linear opacities in the right lower lung zone likely represent subsegmental atelectasis. No pleural effusion or pneumothorax identified. IMPRESSION: 1. Medical devices as described. 2. Linear opacities in the right lower lung zone likely represent subsegmental atelectasis. Electronically Signed   By: Ofilia Neas M.D.   On: 12/31/2020 08:50   VAS US CAROTID  Result Date: 12/30/2020 Carotid Arterial Duplex Study Patient Name:  Dalton Miller  Date of Exam:   12/30/2020 Medical Rec #: [id number]     Accession #:    [id number] Date of Birth: 09/28/1945      Patient Gender: M Patient Age:   75 years Exam Location:  [facility] Procedure:      VAS US CAROTID Referring Phys: Rosalin Hawking --------------------------------------------------------------------------------  Indications:       CVA and altered mental status after a fall at home. Risk Factors:      Hypertension, hyperlipidemia, Diabetes. Comparison Study:  No prior.  Performing Technologist: Oda Cogan RDMS, RVT  Examination Guidelines: A complete evaluation includes B-mode imaging, spectral Doppler, color Doppler, and power Doppler as needed of all accessible portions of each vessel. Bilateral testing is considered an integral part of a complete examination. Limited examinations for reoccurring indications may be performed as noted.  Right Carotid Findings: +----------+--------+--------+--------+------------------+--------+             PSV cm/s EDV cm/s Stenosis Plaque Description Comments  +----------+--------+--------+--------+------------------+--------+  CCA Prox   99       27                                             +----------+--------+--------+--------+------------------+--------+  CCA Distal 89       31                                             +----------+--------+--------+--------+------------------+--------+  ICA Prox   120      43       40-59%  calcific                     +----------+--------+--------+--------+------------------+--------+  ICA Mid    140      57       40-59%   calcific                     +----------+--------+--------+--------+------------------+--------+  ICA Distal 136      53                                             +----------+--------+--------+--------+------------------+--------+ +----------+--------+-------+----------------+-------------------+             PSV cm/s EDV cms Describe         Arm Pressure (mmHG)  +----------+--------+-------+----------------+-------------------+  Subclavian 98               Multiphasic, WNL                      +----------+--------+-------+----------------+-------------------+ +---------+--------+---+--------+--+---------+  Vertebral PSV cm/s 109 EDV cm/s 32 Antegrade  +---------+--------+---+--------+--+---------+  Left Carotid Findings: +----------+--------+--------+--------+------------------+--------+             PSV cm/s EDV cm/s Stenosis Plaque Description Comments   +----------+--------+--------+--------+------------------+--------+  CCA Prox   98       25                                             +----------+--------+--------+--------+------------------+--------+  CCA Distal 88       24                                             +----------+--------+--------+--------+------------------+--------+  ICA Prox   213      73       60-79%   heterogenous                 +----------+--------+--------+--------+------------------+--------+  ICA Mid    148      55       40-59%   heterogenous                 +----------+--------+--------+--------+------------------+--------+  ICA Distal 115      49                                             +----------+--------+--------+--------+------------------+--------+  ECA        107      21                                             +----------+--------+--------+--------+------------------+--------+ +----------+--------+--------+----------------+-------------------+             PSV cm/s EDV cm/s Describe         Arm Pressure (mmHG)  +----------+--------+--------+----------------+-------------------+  Subclavian 134               Multiphasic, WNL                      +----------+--------+--------+----------------+-------------------+ +---------+--------+--+--------+--+---------+  Vertebral PSV cm/s 89 EDV cm/s 25 Antegrade  +---------+--------+--+--------+--+---------+   Summary: Right Carotid: Velocities in the right ICA are consistent with a 40-59%                stenosis. Left Carotid: Velocities in the left ICA are consistent with a 60-79% stenosis. Vertebrals:  Bilateral vertebral arteries demonstrate antegrade flow. Subclavians: Normal flow hemodynamics were seen in bilateral subclavian              arteries. *See table(s) above for measurements and observations.  Electronically signed by Jamelle Haring on 12/30/2020 at 3:19:41 PM.    Final     PHYSICAL EXAM General:  Well-developed, well-nourished intubated male in no acute distress    Neurological (on sedation with propofol): PERRL, cough and weak corneal reflexes present.  Doll's eyes reflex present. Will flicker RUE and LLE to noxious stimuli.  ASSESSMENT/PLAN Mr. BROWNIE NEHME is a 75 y.o. male with history of metastatic melanoma, lung cancer, urothelia carcinoma, tobacco abuse, thrombocytopenia and recent surgery for R shoulder impingement presenting after being found down by family.  EMS was called.  He was found to be hypotensive and hypoxic, and right MCA stroke was found on CT.  He was incidentally found to be COVID positive.  He was outside of the window for TNK, and the risks of thrombectomy were felt to outweigh the potential benefits.  Stroke:  right MCA infarct, could be due to carotid stenosis in the setting of septic shock  CT head Ill-defined hypodensity in right basal ganglia and right frontal lobe concerning for acute right MCA territory stroke MRI  scattered cortical and subcortical acute infarcts in right MCA distribution MRA  70% stenosis at right M1 segment. Carotid Doppler  40-59% stenosis in right ICA, 60-79% stenosis in left ICA 2D Echo EF 85-88%, grade 1 diastolic dysfunction, no atrial level shunt LDL 71 HgbA1c 7.6 VTE prophylaxis - SCDs aspirin 81 mg daily prior to admission, now on No antithrombotic secondary to GI bleed Therapy recommendations:  pending Disposition:  pending  Hypertension Home meds:  lisinopril 10 mg daily Stable Long-term BP goal normotensive  Hyperlipidemia Home meds:  atorvastatin 10 mg daily, not resumed in hospital as elevated LFTs LDL 71, goal < 70 Resume lipitor once LFT normalizes   Diabetes type II Uncontrolled Home meds:  none HgbA1c 7.6, goal < 7.0 CBGs Management per CCM SSI  Other Stroke Risk Factors Advanced Age >/= 44  Cigarette smoker advised to stop smoking  GI bleed S/p banding of esophageal varices Do not reinsert OG tube per GI Monitor H/H, transfuse as needed Management per primary  team  Respiratory failure Septic shock Intubated due to inability to protect airway Management per CCM.  Hyperammonemia Ammonia level 115 on lactulose repeat ammonia level 73.    ?  Cirrhosis Significantly elevated AST and ALT 1186/735 INR elevated 1.5 Jaundice esophageal varices  AKI creatinine 2.47->2.64-> 4.02  leukocytosis  WBC 26.6->27.4-> 20.1-> 15.3  Hospital day # [place] , MSN, AGACNP-BC [facility] See Amion for schedule and pager information 12/31/2020 1:10 PM   ATTENDING NOTE: I reviewed above note and agree with the assessment and plan. Pt was seen and examined.   No family at bedside.  Patient still intubated, neurologically unchanged.  MRI showed scattered right MCA cortical and subcortical infarcts.  MRI showed right M1 70% stenosis.  A1c 6.2.  Right MCA stroke likely due to large vessel disease given right M1  high-grade stenosis and right ICA moderate stenosis.  Recommend to avoid low BP.  Continue septic shock, cirrhosis, and AKI treatment per primary team.  Once liver function normalizes, can resume statin.  Once GI clears, may consider antiplatelet.  We will follow  For detailed assessment and plan, please refer to above as I have made changes wherever appropriate.   Rosalin Hawking, MD PhD Stroke Neurology 12/31/2020 7:42 PM  This patient is critically ill due to septic shock, cirrhosis, right brain stroke, respiratory failure, GI bleeding, AKI and at significant risk of neurological worsening, death form septic shock, severe sepsis, renal failure, liver failure, respiratory failure. This patient's care requires constant monitoring of vital signs, hemodynamics, respiratory and cardiac monitoring, review of multiple databases, neurological assessment, discussion with family, other specialists and medical decision making of high complexity. I spent 30 minutes of neurocritical care time in the care of this patient.  To contact  Stroke Continuity provider, please refer to [email or web address] After hours, contact General Neurology

## 2020-12-31 NOTE — Progress Notes (Signed)
Pt transported to and from MRI without event.

## 2020-12-31 NOTE — Progress Notes (Signed)
°  Transition of Care [employer]) Screening Note   Patient Details  Name: Dalton Miller Date of Birth: Apr 25, 1945   Transition of Care [facility]) CM/SW Contact:    Benard Halsted, LCSW Phone Number: 12/31/2020, 9:30 AM    Transition of Care Department [facility]) has reviewed patient and no TOC needs have been identified at this time. We will continue to monitor patient advancement through interdisciplinary progression rounds. If new patient transition needs arise, please place a TOC consult.  [facility] Online Notification Completed, ID#: [id number]

## 2020-12-31 NOTE — Progress Notes (Addendum)
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 Found 12/27 around 0600, unresponsive beside toilet. It is not known how long the patient was downed. He was reportedly pale and yellow appearing. Taken to [facility] where he was noted to be hypoxic SpO2 70s, hypotensive 74/50. Family states that 12/26 pt complained of upper GI pain and had a black stool.   Pt was obtunded in ED and required intubation. CT H obtained and revealed acute R MCA CVA. OGT place and promptly yielded >500 ml coffee ground emesis. His hgb is 7.1 in the ED -- received 1 PRBC. His COVID test has also resulted positive.   PCCM consulted for admission in this setting   Pertinent  Medical History  Metastatic melanoma , left lung  Recurrent urothelial cancer DM2 HTN HLD Emphysema  S/p nephrectomy   Significant Hospital Events: Including procedures, antibiotic start and stop dates in addition to other pertinent events   12/27 [facility] for AMS. Obtunded, intubated. R MCA stroke, GIB, AKI, Acute liver injury. [facility]ring to [facility].  COVID PCR is positive  Interim History / Subjective:   Continues on pressure support weans.  Blood sugar low today  Objective   Blood pressure 102/61, pulse 68, temperature (!) 97.5 F (36.4 C), resp. rate (!) 22, weight 77 kg, SpO2 100 %.    Vent Mode: PRVC FiO2 (%):  [40 %] 40 % Set Rate:  [22 bmp] 22 bmp Vt Set:  [530 mL] 530 mL PEEP:  [5 cmH20] 5 cmH20 Pressure Support:  [10 cmH20] 10 cmH20 Plateau Pressure:  [15  cmH20-20 cmH20] 20 cmH20   Intake/Output Summary (Last 24 hours) at 12/31/2020 0849 Last data filed at 12/31/2020 0800 Gross per 24 hour  Intake 654.44 ml  Output 355 ml  Net 299.44 ml   Filed Weights   12/30/20 0425 12/31/20 0500  Weight: 77.1 kg 77 kg    Examination: Gen:      No acute distress HEENT:  EOMI, sclera anicteric Neck:     No masses; no thyromegaly, ET tube Lungs:    Clear to auscultation bilaterally; normal respiratory effort CV:         Regular rate and rhythm; no murmurs Abd:      + bowel sounds; soft, non-tender; no palpable masses, no distension Ext:    No edema; adequate peripheral perfusion Skin:      Warm and dry; no rash Neuro: Sedated  Labs/imaging reviewed Labs are pending Chest x-ray with right lower lobe atelectasis  Resolved Hospital Problem list     Assessment & Plan:  Acute R MCA territory infarct  -involving R basal ganglia, R frontal lobe  -notably, is typically thrombocytopenic but on admission plt are >500  P Appreciate neurology input.  MRI pending Echo reviewed with normal EF, diastolic dysfunction  Acute metabolic encephalopathy: multifactorial  -hyperammonemia (115) , acute liver injury, AKI, sepsis, stroke, hypoxia  P Continue lactulose, propofol for sedation  Acute respiratory failure with hypoxia -POA COVID-19 infection -POA P Suspect COVID-19 infection  is incidental as chest x-ray appears clear. Cannot get remdesivir and molnupiravir due to liver failure. Paxlovid cannot be crushed in feeding tube Supportive care Steroids if he deteriorates  Suspected severe sepsis -POA. Source unclear.  -WBC 40.7. Possible component of hemoconcentration contributing but picture is c/w severe sepsis  P DC Vanco as MRSA is negative Narrow abx to rocephin to cover possible CAP  AKI Solitary kidney P Given 1 dose Lokelma yesterday.  Follow labs Follow urine output and creatinine  Acute liver injury - POA Hyperammonemia -POA GIB,  acute blood loss anemia EGD showed varices s/p banding -acutely elevated LFTs. Ammonia 115 P Unclear why he has cirrhosis as per family he does not drink and hepatitis panel is negative Upper quadrant ultrasound noted with no acute abnormality Follow labs  AGMA, severe - POA > improved Lactic Acidosis -POA > improved -reflective of above processes  P Follow labs  Acute thrombocytosis (? 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  P Hold outpt promacta (25mg  qD)   Metastatic recurrent melanoma - POA -L upper lung lesion, mediastinal adenopathy  -s/p 4 cycles ipi/nivo, maintenance nivo dc due to ITP Recurrent low grade urothelial carcinoma s/p TURBT, repeat TURBT x2 - POA P Supportive care  Called daughter 12/29 but there was no answer  Best Practice (right click and "Reselect all SmartList Selections" daily)   Diet/type: NPO DVT prophylaxis: SCD GI prophylaxis: PPI Lines: N/A Foley:  Yes, and it is still needed Code Status:  full code Last date of multidisciplinary goals of care discussion [12/28] Family updated on 12/28. Remains full code  Critical care time:    The patient is critically ill with multiple organ system failure and requires high complexity decision making for assessment and support, frequent evaluation and titration of therapies, advanced monitoring, review of radiographic studies and interpretation of complex data.   Critical Care Time devoted to patient care services, exclusive of separately billable procedures, described in this note is 35 minutes.   Marshell Garfinkel MD [facility] See Amion for pager  If no response to pager , please call [phone] until 7pm After 7:00 pm call Elink  [phone] 12/31/2020, 9:16 AM

## 2020-12-31 NOTE — Progress Notes (Addendum)
Patient ID: Dalton Miller, male   DOB: 1945/02/03, 75 y.o.   MRN: [id number]    Progress Note   Subjective  Day #3 CC: Acute CVA requiring intubation, upper GI bleed, COVID-19 positive, sepsis  IV PPI IV octreotide Continues on pressor support  EGD yesterday-grade 2 esophageal varices 1 varix with erosion and 1 with a red spot banded x4, blood clots in the gastric fundus duodenum normal  MRI brain-scattered cortical and subcortical acute infarcts in the right MCA distribution with mild petechial hemorrhage, 90% stenosis right M1 segment  Labs pending this a.m-lab having difficulty obtaining specimens  No further active bleeding overnight, NG tube out since EGD    Objective   Vital signs in last 24 hours: Temp:  [97.5 F (36.4 C)-97.6 F (36.4 C)] 97.5 F (36.4 C) (12/29 0000) Pulse Rate:  [66-102] 66 (12/29 0915) Resp:  [15-29] 22 (12/29 0915) BP: (85-118)/(54-68) 98/60 (12/29 0915) SpO2:  [100 %] 100 % (12/29 0915) FiO2 (%):  [40 %] 40 % (12/29 0809) Weight:  [77 kg] 77 kg (12/29 0500) Last BM Date: 12/31/20 General:   Elderly white male intubated, sedated Heart:  Regular rate and rhythm; no murmurs Lungs: Respirations even and unlabored, lungs CTA bilaterally Abdomen:  Soft, nontender and nondistended. Normal bowel sounds. Extremities:  Without edema. Neurologic: Intubated and sedated Intake/Output from previous day: 12/28 0701 - 12/29 0700 In: 610.2 [I.V.:508.7; IV Piggyback:101.5] Out: 355 [Urine:355] Intake/Output this shift: Total I/O In: 112.5 [I.V.:112.5] Out: -   Lab Results: Recent Labs    12/30/20 0006 12/30/20 0009 12/30/20 0356 12/30/20 0421 12/30/20 1110  WBC 26.6*  --   --  27.4* 24.8*  HGB 9.5*   < > 9.2* 9.6* 8.9*  HCT 27.3*   < > 27.0* 26.8* 24.6*  PLT 266  --   --  253 224   < > = values in this interval not displayed.   BMET Recent Labs    12/29/20 0908 12/29/20 2352 12/30/20 0006 12/30/20 0009 12/30/20 0356 12/30/20 0421  NA  141   < > 137 136 136 140  K 4.8   < > 5.0 4.8 5.1 5.5*  CL 108  --  106  --   --  104  CO2 9*  --  16*  --   --  18*  GLUCOSE 284*  --  398*  --   --  375*  BUN 95*  --  108*  --   --  114*  CREATININE 1.77*  --  2.47*  --   --  2.64*  CALCIUM 8.6*  --  7.1*  --   --  7.0*   < > = values in this interval not displayed.   LFT Recent Labs    12/30/20 0006  PROT 4.2*  ALBUMIN 2.1*  AST 1,186*  ALT 735*  ALKPHOS 176*  BILITOT 3.4*  BILIDIR 1.7*  IBILI 1.7*   PT/INR Recent Labs    12/30/20 0006  LABPROT 17.9*  INR 1.5*    Studies/Results: MR ANGIO HEAD WO CONTRAST  Result Date: 12/31/2020 CLINICAL DATA:  Stroke follow up. EXAM: MRI HEAD WITHOUT CONTRAST MRA HEAD WITHOUT CONTRAST TECHNIQUE: Multiplanar, multi-echo pulse sequences of the brain and surrounding structures were acquired without intravenous contrast. Angiographic images of the Circle of Willis were acquired using MRA technique without intravenous contrast. COMPARISON:  2 days ago. FINDINGS: MRI HEAD FINDINGS Brain: Patchy restricted diffusion along the right cerebral convexity involving the frontal, parietal, and occipital  cortex. Restricted diffusion and swelling at the right stratum with mild petechial hemorrhage. Mild chronic small vessel ischemia in the hemispheric white matter no hydrocephalus, shift, or collection. Vascular: See below. Skull and upper cervical spine: Normal marrow signal Sinuses/Orbits: Minor mastoid and sinus opacification in the setting of nasopharyngeal fluid. MRA HEAD FINDINGS Anterior circulation: Atheromatous irregularity of the cavernous carotids. 70% right M1 segment stenosis with wasted appearance rather than the appearance of central clot/embolism. No beading or aneurysm. Posterior circulation: The vertebral and basilar arteries are smoothly contoured and widely patent. Proximal basilar fenestration. IMPRESSION: 1. Scattered cortical and subcortical acute infarcts in the right MCA  distribution with mild petechial hemorrhage. 2. 70% stenosis at the right M1 segment. Electronically Signed   By: Jorje Guild M.D.   On: 12/31/2020 07:12   MR BRAIN WO CONTRAST  Result Date: 12/31/2020 CLINICAL DATA:  Stroke follow up. EXAM: MRI HEAD WITHOUT CONTRAST MRA HEAD WITHOUT CONTRAST TECHNIQUE: Multiplanar, multi-echo pulse sequences of the brain and surrounding structures were acquired without intravenous contrast. Angiographic images of the Circle of Willis were acquired using MRA technique without intravenous contrast. COMPARISON:  2 days ago. FINDINGS: MRI HEAD FINDINGS Brain: Patchy restricted diffusion along the right cerebral convexity involving the frontal, parietal, and occipital cortex. Restricted diffusion and swelling at the right stratum with mild petechial hemorrhage. Mild chronic small vessel ischemia in the hemispheric white matter no hydrocephalus, shift, or collection. Vascular: See below. Skull and upper cervical spine: Normal marrow signal Sinuses/Orbits: Minor mastoid and sinus opacification in the setting of nasopharyngeal fluid. MRA HEAD FINDINGS Anterior circulation: Atheromatous irregularity of the cavernous carotids. 70% right M1 segment stenosis with wasted appearance rather than the appearance of central clot/embolism. No beading or aneurysm. Posterior circulation: The vertebral and basilar arteries are smoothly contoured and widely patent. Proximal basilar fenestration. IMPRESSION: 1. Scattered cortical and subcortical acute infarcts in the right MCA distribution with mild petechial hemorrhage. 2. 70% stenosis at the right M1 segment. Electronically Signed   By: Jorje Guild M.D.   On: 12/31/2020 07:12   DG Chest Port 1 View  Result Date: 12/31/2020 CLINICAL DATA:  Intubation, CVA EXAM: PORTABLE CHEST 1 VIEW COMPARISON:  Chest x-ray 12/29/2020 FINDINGS: Endotracheal tube tip is 4.7 cm above the carina. Enteric tube has been removed. Linear opacities in the right  lower lung zone likely represent subsegmental atelectasis. No pleural effusion or pneumothorax identified. IMPRESSION: 1. Medical devices as described. 2. Linear opacities in the right lower lung zone likely represent subsegmental atelectasis. Electronically Signed   By: Ofilia Neas M.D.   On: 12/31/2020 08:50   DG CHEST PORT 1 VIEW  Result Date: 12/29/2020 CLINICAL DATA:  Intubated EXAM: PORTABLE CHEST 1 VIEW COMPARISON:  12/29/2020 FINDINGS: Single frontal view of the chest demonstrates endotracheal tube overlying tracheal air column tip midway between thoracic inlet and carina. Enteric catheter passes below diaphragm, tip excluded by collimation. Side port projects over gastric fundus. Cardiac silhouette is unremarkable. Diffuse interstitial prominence throughout the lungs without focal consolidation, effusion, or pneumothorax. No acute bony abnormalities. IMPRESSION: 1. Support devices as above. 2. Continued interstitial prominence, which may reflect mild interstitial edema. No airspace disease. Electronically Signed   By: Randa Ngo M.D.   On: 12/29/2020 23:37   ECHOCARDIOGRAM COMPLETE  Result Date: 12/30/2020    ECHOCARDIOGRAM REPORT   Patient Name:   Dalton Miller Date of Exam: 12/30/2020 Medical Rec #:  [id number]    Height:       67.0  in Accession #:    [id number]   Weight:       170.0 lb Date of Birth:  November 23, 1945     BSA:          1.887 m Patient Age:    14 years     BP:           102/63 mmHg Patient Gender: M            HR:           89 bpm. Exam Location:  Inpatient Procedure: 2D Echo, Cardiac Doppler, Color Doppler and Intracardiac            Opacification Agent Indications:    Stroke  History:        Patient has no prior history of Echocardiogram examinations.  Sonographer:    Jyl Heinz Referring Phys: [id number] [address]  1. Left ventricular ejection fraction, by estimation, is 50 to 55%. The left ventricle has low normal function. The left ventricle has no regional  wall motion abnormalities. Left ventricular diastolic parameters are consistent with Grade I diastolic dysfunction (impaired relaxation).  2. Right ventricular systolic function is normal. The right ventricular size is normal. There is normal pulmonary artery systolic pressure.  3. The mitral valve is normal in structure. Trivial mitral valve regurgitation. No evidence of mitral stenosis.  4. The aortic valve is tricuspid. There is mild calcification of the aortic valve. There is mild thickening of the aortic valve. Aortic valve regurgitation is not visualized. Aortic valve sclerosis is present, with no evidence of aortic valve stenosis.  5. The inferior vena cava is normal in size with <50% respiratory variability, suggesting right atrial pressure of 8 mmHg. FINDINGS  Left Ventricle: Left ventricular ejection fraction, by estimation, is 50 to 55%. The left ventricle has low normal function. The left ventricle has no regional wall motion abnormalities. The left ventricular internal cavity size was normal in size. There is no left ventricular hypertrophy. Left ventricular diastolic parameters are consistent with Grade I diastolic dysfunction (impaired relaxation). Normal left ventricular filling pressure. Right Ventricle: The right ventricular size is normal. No increase in right ventricular wall thickness. Right ventricular systolic function is normal. There is normal pulmonary artery systolic pressure. The tricuspid regurgitant velocity is 1.55 m/s, and  with an assumed right atrial pressure of 8 mmHg, the estimated right ventricular systolic pressure is 26.7 mmHg. Left Atrium: Left atrial size was normal in size. Right Atrium: Right atrial size was normal in size. Pericardium: There is no evidence of pericardial effusion. Mitral Valve: The mitral valve is normal in structure. Trivial mitral valve regurgitation. No evidence of mitral valve stenosis. Tricuspid Valve: The tricuspid valve is normal in structure.  Tricuspid valve regurgitation is trivial. No evidence of tricuspid stenosis. Aortic Valve: The aortic valve is tricuspid. There is mild calcification of the aortic valve. There is mild thickening of the aortic valve. Aortic valve regurgitation is not visualized. Aortic valve sclerosis is present, with no evidence of aortic valve stenosis. Aortic valve peak gradient measures 10.4 mmHg. Pulmonic Valve: The pulmonic valve was normal in structure. Pulmonic valve regurgitation is not visualized. No evidence of pulmonic stenosis. Aorta: The aortic root is normal in size and structure. Venous: The inferior vena cava is normal in size with less than 50% respiratory variability, suggesting right atrial pressure of 8 mmHg. IAS/Shunts: No atrial level shunt detected by color flow Doppler.  LEFT VENTRICLE PLAX 2D LVIDd:  4.40 cm     Diastology LVIDs:         2.70 cm     LV e' medial:    4.13 cm/s LV PW:         0.80 cm     LV E/e' medial:  14.7 LV IVS:        0.80 cm     LV e' lateral:   7.07 cm/s LVOT diam:     2.00 cm     LV E/e' lateral: 8.6 LV SV:         46 LV SV Index:   25 LVOT Area:     3.14 cm  LV Volumes (MOD) LV vol d, MOD A2C: 76.9 ml LV vol d, MOD A4C: 89.2 ml LV vol s, MOD A2C: 31.9 ml LV vol s, MOD A4C: 42.6 ml LV SV MOD A2C:     45.0 ml LV SV MOD A4C:     89.2 ml LV SV MOD BP:      44.9 ml RIGHT VENTRICLE             IVC RV Basal diam:  2.80 cm     IVC diam: 1.70 cm RV Mid diam:    2.00 cm RV S prime:     16.30 cm/s TAPSE (M-mode): 1.9 cm LEFT ATRIUM             Index        RIGHT ATRIUM           Index LA diam:        2.10 cm 1.11 cm/m   RA Area:     11.20 cm LA Vol (A2C):   33.2 ml 17.59 ml/m  RA Volume:   23.60 ml  12.51 ml/m LA Vol (A4C):   24.3 ml 12.88 ml/m LA Biplane Vol: 29.3 ml 15.53 ml/m  AORTIC VALVE AV Area (Vmax): 1.89 cm AV Vmax:        161.00 cm/s AV Peak Grad:   10.4 mmHg LVOT Vmax:      97.00 cm/s LVOT Vmean:     69.600 cm/s LVOT VTI:       0.148 m  AORTA Ao Root diam: 3.00 cm Ao  Asc diam:  3.20 cm MITRAL VALVE               TRICUSPID VALVE MV Area (PHT): 3.05 cm    TR Peak grad:   9.6 mmHg MV Decel Time: 249 msec    TR Vmax:        155.00 cm/s MV E velocity: 60.90 cm/s MV A velocity: 63.60 cm/s  SHUNTS MV E/A ratio:  0.96        Systemic VTI:  0.15 m                            Systemic Diam: 2.00 cm Skeet Latch MD Electronically signed by Skeet Latch MD Signature Date/Time: 12/30/2020/1:34:13 PM    Final    VAS US CAROTID  Result Date: 12/30/2020 Carotid Arterial Duplex Study Patient Name:  Dalton Miller  Date of Exam:   12/30/2020 Medical Rec #: [id number]     Accession #:    [id number] Date of Birth: 01/26/1945      Patient Gender: M Patient Age:   41 years Exam Location:  [facility] Procedure:      VAS US CAROTID Referring Phys: Rosalin Hawking --------------------------------------------------------------------------------  Indications:  CVA and altered mental status after a fall at home. Risk Factors:      Hypertension, hyperlipidemia, Diabetes. Comparison Study:  No prior. Performing Technologist: Oda Cogan RDMS, RVT  Examination Guidelines: A complete evaluation includes B-mode imaging, spectral Doppler, color Doppler, and power Doppler as needed of all accessible portions of each vessel. Bilateral testing is considered an integral part of a complete examination. Limited examinations for reoccurring indications may be performed as noted.  Right Carotid Findings: +----------+--------+--------+--------+------------------+--------+             PSV cm/s EDV cm/s Stenosis Plaque Description Comments  +----------+--------+--------+--------+------------------+--------+  CCA Prox   99       27                                             +----------+--------+--------+--------+------------------+--------+  CCA Distal 89       31                                             +----------+--------+--------+--------+------------------+--------+  ICA Prox   120      43        40-59%   calcific                     +----------+--------+--------+--------+------------------+--------+  ICA Mid    140      57       40-59%   calcific                     +----------+--------+--------+--------+------------------+--------+  ICA Distal 136      53                                             +----------+--------+--------+--------+------------------+--------+ +----------+--------+-------+----------------+-------------------+             PSV cm/s EDV cms Describe         Arm Pressure (mmHG)  +----------+--------+-------+----------------+-------------------+  Subclavian 98               Multiphasic, WNL                      +----------+--------+-------+----------------+-------------------+ +---------+--------+---+--------+--+---------+  Vertebral PSV cm/s 109 EDV cm/s 32 Antegrade  +---------+--------+---+--------+--+---------+  Left Carotid Findings: +----------+--------+--------+--------+------------------+--------+             PSV cm/s EDV cm/s Stenosis Plaque Description Comments  +----------+--------+--------+--------+------------------+--------+  CCA Prox   98       25                                             +----------+--------+--------+--------+------------------+--------+  CCA Distal 88       24                                             +----------+--------+--------+--------+------------------+--------+  ICA Prox   213      73  60-79%   heterogenous                 +----------+--------+--------+--------+------------------+--------+  ICA Mid    148      55       40-59%   heterogenous                 +----------+--------+--------+--------+------------------+--------+  ICA Distal 115      49                                             +----------+--------+--------+--------+------------------+--------+  ECA        107      21                                             +----------+--------+--------+--------+------------------+--------+  +----------+--------+--------+----------------+-------------------+             PSV cm/s EDV cm/s Describe         Arm Pressure (mmHG)  +----------+--------+--------+----------------+-------------------+  Subclavian 134               Multiphasic, WNL                      +----------+--------+--------+----------------+-------------------+ +---------+--------+--+--------+--+---------+  Vertebral PSV cm/s 89 EDV cm/s 25 Antegrade  +---------+--------+--+--------+--+---------+   Summary: Right Carotid: Velocities in the right ICA are consistent with a 40-59%                stenosis. Left Carotid: Velocities in the left ICA are consistent with a 60-79% stenosis. Vertebrals:  Bilateral vertebral arteries demonstrate antegrade flow. Subclavians: Normal flow hemodynamics were seen in bilateral subclavian              arteries. *See table(s) above for measurements and observations.  Electronically signed by Jamelle Haring on 12/30/2020 at 3:19:41 PM.    Final    US Abdomen Limited RUQ (LIVER/GB)  Result Date: 12/30/2020 CLINICAL DATA:  History of ETT. EXAM: ULTRASOUND ABDOMEN LIMITED RIGHT UPPER QUADRANT COMPARISON:  March 13, 2010 FINDINGS: Gallbladder: No gallstones are visualized. The gallbladder wall measures 4.24 mm in thickness. No sonographic Murphy sign noted by sonographer (it should be noted that the patient is intubated). Common bile duct: Diameter: 4.23 mm Liver: No focal lesion identified. Within normal limits in parenchymal echogenicity. Portal vein is patent on color Doppler imaging with normal direction of blood flow towards the liver. Other: A 2.7 cm x 2.0 cm x 2.1 cm anechoic structure is seen within the lower pole of the right kidney. No abnormal flow is noted within this region on color Doppler evaluation. IMPRESSION: 1. Gallbladder wall thickening without evidence of cholelithiasis or additional findings to suggest the presence of acute cholecystitis. 2. Simple right renal cyst. Electronically Signed    By: Virgina Norfolk M.D.   On: 12/30/2020 01:53       Assessment / Plan:    #57 75 year old white male with acute right CVA MRI as above shows scattered acute infarcts in the right MCA distribution  #2 acute upper GI bleed-secondary to distal esophageal varices, status post banding x4 yesterday No active bleeding since procedure Patient on octreotide and IV PPI Labs pending  #3 acute metabolic encephalopathy multifactorial #4 acute COVID-19 infection last respiratory failure/sepsis No  remdesivir or molnupravir due to evidence of hepatic decompensation-Paxil.  Cannot be crushed Plan per CCM and steroids continue to geriatrics  #5 acute kidney injury-solitary kidney status post nephrectomy #6 acute hepatic injury-- No evidence of cirrhosis by ultrasound, but found to have varices on EGD-patient needs CT of the abdomen pelvis preferably with contrast in the next few days rule out underlying decompensated liver disease, rule out other source for portal hypertension Acute LFT elevation may be secondary to sepsis, COVID-19 hepatitis, shock  #7 history of chronic thrombocytopenia, had been on Promacta as an outpatient now being held due to thrombocytopenia #8 history of metastatic melanoma to the lung #9 history of renal cell CA status post nephrectomy  Plan:  Continue current supportive management Trying to avoid enteral feeding tube/NG tube for a few days post banding of esophageal varices Continue IV octreotide x 72 hours total Continue IV PPI twice daily Continue to trend hemoglobin and LFTs/INR He will need CT of the abdomen and pelvis-await today's labs then decide if this can be done contrasted or noncontrasted.    LOS: 2 days   Amy Esterwood PA-C 12/31/2020, 11:04 AM     Attending Physician Note   I have taken an interval history, reviewed the chart and examined the patient. I personally saw the patient and performed a substantive portion of this encounter, including  a complete performance of at least one of the key components, in conjunction with the APP. I agree with the APP's note, impression and recommendations.   *Esophageal variceal bleed post banding. No further bleeding. Hgb has decreased to 7.9, likely equilibrating. Continue IV octreotide x 3 days, IV pantoprazole. Further evaluation for cause of portal hypertension/varices with CT AP.  Trend CBC.   *Elevated LFTs which are improving. Suspect shock liver, possibly Covid related. Trend LFTs, PT/INR.   Lucio Edward, MD [facility] See AMION, [name], for our on call provider

## 2020-12-31 NOTE — Progress Notes (Signed)
Initial Nutrition Assessment  DOCUMENTATION CODES:   Not applicable  INTERVENTION:   If pt remains intubated and able to obtain enteral access recommend:  Vital 1.5 at 50 ml/h (1200 ml per day) Prosource TF 45 ml TID  Provides 1920 kcal, 114 gm protein, 912 ml free water daily  If able to extubate and advance diet encourage PO intake as able; supplement diet as appropriate.   NUTRITION DIAGNOSIS:   Inadequate oral intake related to inability to eat as evidenced by NPO status.  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 Pt with >500 ml coffee ground emesis in ED.    Pt does not currently have enteral access, GI recommends avoiding NG/OG tube after esophageal banding.  Per MD unclear why pt with cirrhosis, per family pt does not drink  Pt discussed during ICU rounds and with RN.   Patient is currently intubated on ventilator support MV: 10.7 L/min Temp (24hrs), Avg:97.6 F (36.4 C), Min:97.5 F (36.4 C), Max:97.6 F (36.4 C)  Propofol: 11 ml/hr provides: 290 kcal  Medications reviewed and include: SSI, lactulose, protonix  D5 @ 125 ml/hr Fentanyl  Octreotide  Labs reviewed: K: 5.3, BUN: 133, Cr: 4.02, Ca 6.3, PO4: 7.8 AST: 425 ALT: 492 Ammonia: 73 A1C: 6.2     Diet Order:   Diet Order             Diet NPO time specified  Diet effective now                   EDUCATION NEEDS:   Not appropriate for education at this time  Skin:  Skin Assessment: Reviewed RN Assessment  Last BM:  12/28 large/black  Height:   Ht Readings from Last 1 Encounters:  12/17/20 5\' 7"  (1.702 m)    Weight:   Wt Readings from Last 1  Encounters:  12/31/20 77 kg    BMI:  Body mass index is 26.59 kg/m.  Estimated Nutritional Needs:   Kcal:  1900-2100  Protein:  110-125 grams  Fluid:  >1.9 L/day  Lockie Pares., RD, LDN, CNSC See AMiON for contact information

## 2020-12-31 NOTE — Progress Notes (Signed)
Orthopedic chart review:  Recent events noted for this critically ill patient.  Patient known to my practice after recent right shoulder arthroscopy performed 12/17/2020.  At initial postop follow-up visit 12/24/2020 patient noted to be profoundly jaundiced.  Patient's primary care provider was contacted for intended outpatient follow-up.  Subsequent rapid deterioration noted as documented in current chart.  From orthopedic perspective supportive care with positioning R arm for comfort.  No specific interventions or precautions.  Please contact us if we may be of further assistance.  Marin Shutter MD  Office: [phone]

## 2020-12-31 NOTE — Progress Notes (Signed)
CRITICAL VALUE STICKER  CRITICAL VALUE: Calcium 6.3  RECEIVER (on-site recipient of call): Caryl Comes RN  DATE & TIME NOTIFIED: 12/29 @ 1143  MESSENGER (representative from lab):  MD NOTIFIED: Marshell Garfinkel MD  [place]: 12/29 @ 1145  RESPONSE:  See new orders

## 2021-01-01 ENCOUNTER — Inpatient Hospital Stay (HOSPITAL_COMMUNITY): Payer: No Typology Code available for payment source

## 2021-01-01 ENCOUNTER — Inpatient Hospital Stay: Payer: Self-pay

## 2021-01-01 DIAGNOSIS — R7989 Other specified abnormal findings of blood chemistry: Secondary | ICD-10-CM | POA: Diagnosis not present

## 2021-01-01 DIAGNOSIS — I8511 Secondary esophageal varices with bleeding: Secondary | ICD-10-CM | POA: Diagnosis not present

## 2021-01-01 DIAGNOSIS — J9601 Acute respiratory failure with hypoxia: Secondary | ICD-10-CM | POA: Diagnosis not present

## 2021-01-01 DIAGNOSIS — K7030 Alcoholic cirrhosis of liver without ascites: Secondary | ICD-10-CM

## 2021-01-01 DIAGNOSIS — K920 Hematemesis: Secondary | ICD-10-CM | POA: Diagnosis not present

## 2021-01-01 LAB — CBC
HCT: 23.2 % — ABNORMAL LOW (ref 39.0–52.0)
Hemoglobin: 7.8 g/dL — ABNORMAL LOW (ref 13.0–17.0)
MCH: 31.7 pg (ref 26.0–34.0)
MCHC: 33.6 g/dL (ref 30.0–36.0)
MCV: 94.3 fL (ref 80.0–100.0)
Platelets: 203 10*3/uL (ref 150–400)
RBC: 2.46 MIL/uL — ABNORMAL LOW (ref 4.22–5.81)
RDW: 17.6 % — ABNORMAL HIGH (ref 11.5–15.5)
WBC: 15.4 10*3/uL — ABNORMAL HIGH (ref 4.0–10.5)
nRBC: 0.2 % (ref 0.0–0.2)

## 2021-01-01 LAB — POCT I-STAT 7, (LYTES, BLD GAS, ICA,H+H)
Acid-base deficit: 5 mmol/L — ABNORMAL HIGH (ref 0.0–2.0)
Bicarbonate: 18.8 mmol/L — ABNORMAL LOW (ref 20.0–28.0)
Calcium, Ion: 0.99 mmol/L — ABNORMAL LOW (ref 1.15–1.40)
HCT: 27 % — ABNORMAL LOW (ref 39.0–52.0)
Hemoglobin: 9.2 g/dL — ABNORMAL LOW (ref 13.0–17.0)
O2 Saturation: 100 %
Patient temperature: 98.3
Potassium: 4.8 mmol/L (ref 3.5–5.1)
Sodium: 136 mmol/L (ref 135–145)
TCO2: 20 mmol/L — ABNORMAL LOW (ref 22–32)
pCO2 arterial: 30.4 mmHg — ABNORMAL LOW (ref 32.0–48.0)
pH, Arterial: 7.398 (ref 7.350–7.450)
pO2, Arterial: 194 mmHg — ABNORMAL HIGH (ref 83.0–108.0)

## 2021-01-01 LAB — COMPREHENSIVE METABOLIC PANEL
ALT: 348 U/L — ABNORMAL HIGH (ref 0–44)
AST: 247 U/L — ABNORMAL HIGH (ref 15–41)
Albumin: 1.8 g/dL — ABNORMAL LOW (ref 3.5–5.0)
Alkaline Phosphatase: 94 U/L (ref 38–126)
Anion gap: 17 — ABNORMAL HIGH (ref 5–15)
BUN: 129 mg/dL — ABNORMAL HIGH (ref 8–23)
CO2: 17 mmol/L — ABNORMAL LOW (ref 22–32)
Calcium: 6.6 mg/dL — ABNORMAL LOW (ref 8.9–10.3)
Chloride: 105 mmol/L (ref 98–111)
Creatinine, Ser: 4.38 mg/dL — ABNORMAL HIGH (ref 0.61–1.24)
Glucose, Bld: 128 mg/dL — ABNORMAL HIGH (ref 70–99)
Potassium: 4.8 mmol/L (ref 3.5–5.1)
Sodium: 139 mmol/L (ref 135–145)
Total Bilirubin: 2.5 mg/dL — ABNORMAL HIGH (ref 0.3–1.2)
Total Protein: 4.3 g/dL — ABNORMAL LOW (ref 6.5–8.1)

## 2021-01-01 LAB — GLUCOSE, CAPILLARY
Glucose-Capillary: 177 mg/dL — ABNORMAL HIGH (ref 70–99)
Glucose-Capillary: 203 mg/dL — ABNORMAL HIGH (ref 70–99)
Glucose-Capillary: 253 mg/dL — ABNORMAL HIGH (ref 70–99)
Glucose-Capillary: 260 mg/dL — ABNORMAL HIGH (ref 70–99)
Glucose-Capillary: 222 mg/dL — ABNORMAL HIGH (ref 70–99)
Glucose-Capillary: 92 mg/dL (ref 70–99)

## 2021-01-01 LAB — PROTIME-INR
INR: 1.2 (ref 0.8–1.2)
Prothrombin Time: 14.9 seconds (ref 11.4–15.2)

## 2021-01-01 LAB — SODIUM, URINE, RANDOM: Sodium, Ur: 53 mmol/L

## 2021-01-01 LAB — LACTIC ACID, PLASMA: Lactic Acid, Venous: 1.5 mmol/L (ref 0.5–1.9)

## 2021-01-01 LAB — MAGNESIUM: Magnesium: 2 mg/dL (ref 1.7–2.4)

## 2021-01-01 LAB — PHOSPHORUS: Phosphorus: 6.5 mg/dL — ABNORMAL HIGH (ref 2.5–4.6)

## 2021-01-01 MED ORDER — SODIUM CHLORIDE 0.9% FLUSH: 10.0000 mL | INTRAVENOUS | Status: DC | PRN

## 2021-01-01 MED ORDER — LACTULOSE ENEMA
300.0000 mL | Freq: Two times a day (BID) | ORAL | Status: DC
  Administered 2021-01-01 – 2021-01-05 (×9): 300 mL via RECTAL
  Filled 2021-01-01 (×10): qty 300

## 2021-01-01 MED ORDER — CALCIUM GLUCONATE-NACL 2-0.675 GM/100ML-% IV SOLN
2.0000 g | Freq: Once | INTRAVENOUS | Status: AC
  Administered 2021-01-01: 10:00:00 2000 mg via INTRAVENOUS
  Filled 2021-01-01: qty 100

## 2021-01-01 MED ORDER — SODIUM CHLORIDE 0.9% FLUSH
10.0000 mL | Freq: Two times a day (BID) | INTRAVENOUS | Status: DC
  Administered 2021-01-01: 23:00:00 20 mL
  Administered 2021-01-02 – 2021-01-03 (×3): 10 mL
  Administered 2021-01-03 – 2021-01-04 (×2): 20 mL
  Administered 2021-01-04 – 2021-01-05 (×3): 10 mL
  Administered 2021-01-06: 30 mL
  Administered 2021-01-06: 10 mL
  Administered 2021-01-07: 30 mL
  Administered 2021-01-07 – 2021-01-09 (×4): 10 mL
  Administered 2021-01-09: 30 mL
  Administered 2021-01-10 – 2021-01-12 (×5): 10 mL

## 2021-01-01 NOTE — Progress Notes (Signed)
Peripherally Inserted Central Catheter Placement  The IV Nurse has discussed with the patient and/or persons authorized to consent for the patient, the purpose of this procedure and the potential benefits and risks involved with this procedure.  The benefits include less needle sticks, lab draws from the catheter, and the patient may be discharged home with the catheter. Risks include, but not limited to, infection, bleeding, blood clot (thrombus formation), and puncture of an artery; nerve damage and irregular heartbeat and possibility to perform a PICC exchange if needed/ordered by physician.  Alternatives to this procedure were also discussed.  Bard Power PICC patient education guide, fact sheet on infection prevention and patient information card has been provided to patient /or left at bedside.    PICC Placement Documentation  PICC Triple Lumen 62/56/38 PICC Left Cephalic 43 cm 0 cm (Active)  Indication for Insertion or Continuance of Line Vasoactive infusions;Prolonged intravenous therapies 01/01/21 2125  Exposed Catheter (cm) 0 cm 01/01/21 2125  Site Assessment Clean;Dry;Intact 01/01/21 2125  Lumen #1 Status Flushed;Saline locked;Blood return noted 01/01/21 2125  Lumen #2 Status Flushed;Saline locked;Blood return noted 01/01/21 2125  Lumen #3 Status Flushed;Saline locked;Blood return noted 01/01/21 2125  Dressing Type Transparent;Securing device 01/01/21 2125  Dressing Status Clean;Dry;Intact 01/01/21 2125  Antimicrobial disc in place? Yes 01/01/21 2125  Safety Lock Not Applicable 93/73/42 8768  Dressing Change Due 01/08/21 01/01/21 2125       Shon Hale 01/01/2021, 9:54 PM

## 2021-01-01 NOTE — Progress Notes (Signed)
Inpatient Diabetes Program Recommendations  AACE/ADA: New Consensus Statement on Inpatient Glycemic Control (2015)  Target Ranges:  Prepandial:   less than 140 mg/dL      Peak postprandial:   less than 180 mg/dL (1-2 hours)      Critically ill patients:  140 - 180 mg/dL   Lab Results  Component Value Date   GLUCAP 203 (H) 01/01/2021   HGBA1C 6.2 (H) 12/31/2020     Diabetes history: Type 2 DM Outpatient Diabetes medications: none Current orders for Inpatient glycemic control: Novolog 0-15 units Q4H  Inpatient Diabetes Program Recommendations:    Consider reducing correction to Novolog 0-6 units Q4H given worsening creatinine an hypoglycemic episode.   Thanks, Bronson Curb, MSN, RNC-OB Diabetes Coordinator [phone] (8a-5p)

## 2021-01-01 NOTE — Progress Notes (Signed)
NAME:  Dalton Miller, MRN:  [id number], DOB:  1945/09/11, LOS: 3 ADMISSION DATE:  12/29/2020, CONSULTATION DATE:  12/29/20 REFERRING MD:  Eulis Foster - [facility], CHIEF COMPLAINT:  CVA and GIB    History of Present Illness:   75 yo with complex PMH including metastatic melanoma, lung cancer, urothelial carcinoma, ongoing tobacco use disorder,  thrombocytopenia (felt to be immunotherapy related ITP), recent R shoulder surgery for impingement + OA (12/17/20), presented to [place] 12/27 with AMS after possible fall  Pt LKW 12/26 around 2100. Found 12/27 around 0600, unresponsive beside toilet. It is not known how long the patient was downed. He was reportedly pale and yellow appearing. Taken to [facility] where he was noted to be hypoxic SpO2 70s, hypotensive 74/50. Family states that 12/26 pt complained of upper GI pain and had a black stool.   Pt was obtunded in ED and required intubation. CT H obtained and revealed acute R MCA CVA. OGT place and promptly yielded >500 ml coffee ground emesis. His hgb is 7.1 in the ED -- received 1 PRBC. His COVID test has also resulted positive.   PCCM consulted for admission in this setting   Pertinent  Medical History  Metastatic melanoma , left lung  Recurrent urothelial cancer DM2 HTN HLD Emphysema  S/p nephrectomy   Significant Hospital Events: Including procedures, antibiotic start and stop dates in addition to other pertinent events   12/27 [facility] for AMS. Obtunded, intubated. R MCA stroke, GIB, AKI, Acute liver injury. Transferring to [facility].  COVID PCR is positive 12/28 EGD with findings of esophageal varices which were banded  Interim History / Subjective:   No acute events overnight. Mental status remains poor   Objective   Blood pressure (!) 103/58, pulse 79, temperature (!) 97.5 F (36.4 C), resp. rate (!) 24, weight 86.4 kg, SpO2 100 %.    Vent Mode: PRVC FiO2 (%):  [40 %] 40 % Set Rate:  [22 bmp] 22 bmp Vt Set:  [530 mL] 530 mL PEEP:  [5 cmH20] 5  cmH20 Pressure Support:  [10 cmH20] 10 cmH20 Plateau Pressure:  [18 cmH20-20 cmH20] 19 cmH20   Intake/Output Summary (Last 24 hours) at 01/01/2021 0944 Last data filed at 01/01/2021 0800 Gross per 24 hour  Intake 3321.85 ml  Output 900 ml  Net 2421.85 ml   Filed Weights   12/30/20 0425 12/31/20 0500 01/01/21 0500  Weight: 77.1 kg 77 kg 86.4 kg    Examination: Blood pressure (!) 103/58, pulse 79, temperature (!) 97.5 F (36.4 C), resp. rate (!) 24, weight 86.4 kg, SpO2 100 %. Gen:      No acute distress HEENT:  EOMI, sclera anicteric, ETT Neck:     No masses; no thyromegaly Lungs:    Clear to auscultation bilaterally; normal respiratory effort CV:         Regular rate and rhythm; no murmurs Abd:      + bowel sounds; soft, non-tender; no palpable masses, no distension Ext:    No edema; adequate peripheral perfusion Skin:      Warm and dry; no rash Neuro: Sedated  Lab/imaging reviewed Significant creatinine increased to 4.38, WBC 15.4 AST 247, ALT 348  Resolved Hospital Problem list   AGMA, severe Lactic Acidosis Acute thrombocytosis (? Component of Hemoconcentration)   Assessment & Plan:  Acute R MCA territory infarct  -involving R basal ganglia, R frontal lobe  -notably, is typically thrombocytopenic but on admission plt are >500  P Appreciate neurology input.  MRI reviewed with MCA infarct Echo with normal EF, diastolic dysfunction  Acute metabolic encephalopathy: multifactorial  -hyperammonemia (115) , acute liver injury, AKI, sepsis, stroke, hypoxia  P Continue lactulose rectal  Acute respiratory failure with hypoxia -POA COVID-19 infection -POA P Suspect COVID-19 infection is incidental as chest x-ray appears clear. Cannot get remdesivir and molnupiravir due to liver failure. Paxlovid cannot be crushed in feeding tube Supportive care Steroids if he deteriorates  Suspected severe sepsis -POA. Source unclear.  -WBC 40.7. Possible component of  hemoconcentration contributing but picture is c/w severe sepsis  P DC Vanco as MRSA is negative Narrowed abx to rocephin to cover possible CAP. Day 4/7  AKI Solitary kidney P Follow urine output and creatinine  Acute liver injury - POA Hyperammonemia -POA GIB, acute blood loss anemia EGD showed varices s/p banding -acutely elevated LFTs. 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  P Hold outpt promacta (25mg  qD)   Metastatic recurrent melanoma - POA -L upper lung lesion, mediastinal adenopathy  -s/p 4 cycles ipi/nivo, maintenance nivo dc due to ITP Recurrent low grade urothelial carcinoma s/p TURBT, repeat TURBT x2 - POA P Supportive care  Daughter updated over telephone 12/30  Best Practice (right click and "Reselect all SmartList Selections" daily)   Diet/type: NPO DVT prophylaxis: SCD GI prophylaxis: PPI Lines: N/A Foley:  Yes, and it is still needed Code Status:  full code Last date of multidisciplinary goals of care discussion [12/28] Family updated on 12/30. Remains full code  Critical care time:    The patient is critically ill with multiple organ system failure and requires high complexity decision making for assessment and support, frequent evaluation and titration of therapies, advanced monitoring, review of radiographic studies and interpretation of complex data.   Critical Care Time devoted to patient care services, exclusive of separately billable procedures, described in this note is 35 minutes.   Marshell Garfinkel MD [facility] See Amion for pager  If no response to pager , please call [phone] until 7pm After 7:00 pm call Elink   [phone] 01/01/2021, 9:57 AM

## 2021-01-01 NOTE — Progress Notes (Addendum)
STROKE TEAM PROGRESS NOTE   INTERVAL HISTORY No family at bedside.  Patient still intubated, unresponsive, not candidate for intubation at this time.  On pain stimulation, moving bilateral lower extremities and right upper extremity, however not left upper extremity.  Vitals:   01/01/21 0845 01/01/21 0900 01/01/21 0902 01/01/21 1118  BP: 96/61 (!) 103/58    Pulse: 62 76 79   Resp: (!) 22 18 (!) 24   Temp:      TempSrc:      SpO2: 100% 99% 100% 100%  Weight:       CBC:  Recent Labs  Lab 12/29/20 0908 12/29/20 2352 12/31/20 1823 01/01/21 0409  WBC 40.7*   < > 15.3* 15.4*  NEUTROABS 26.9*  --   --   --   HGB 7.1*   < > 7.3* 7.8*  HCT 23.2*   < > 21.2* 23.2*  MCV 107.4*   < > 93.0 94.3  PLT 539*   < > 164 203   < > = values in this interval not displayed.   Basic Metabolic Panel:  Recent Labs  Lab 12/31/20 1016 01/01/21 0409  NA 135 139  K 5.3* 4.8  CL 101 105  CO2 19* 17*  GLUCOSE 66* 128*  BUN 133* 129*  CREATININE 4.02* 4.38*  CALCIUM 6.3* 6.6*  MG 2.1 2.0  PHOS 7.8* 6.5*   Lipid Panel:  Recent Labs  Lab 12/31/20 1016  CHOL 108  TRIG 282*  HDL <10*  CHOLHDL NOT CALCULATED  VLDL 56*  LDLCALC NOT CALCULATED   HgbA1c:  Recent Labs  Lab 12/31/20 1138  HGBA1C 6.2*   Urine Drug Screen: No results for input(s): LABOPIA, COCAINSCRNUR, LABBENZ, AMPHETMU, THCU, LABBARB in the last 168 hours.  Alcohol Level  Recent Labs  Lab 12/29/20 0909  ETH <10    IMAGING past 24 hours DG Chest Port 1 View  Result Date: 01/01/2021 CLINICAL DATA:  Acute respiratory failure; COVID positive EXAM: PORTABLE CHEST 1 VIEW COMPARISON:  12/31/2020 FINDINGS: Endotracheal tube is unchanged. Persistent opacity at the right lung base with change in configuration. No pleural effusion or pneumothorax. Stable cardiomediastinal contours. IMPRESSION: Right basilar atelectasis/consolidation. Electronically Signed   By: Macy Mis M.D.   On: 01/01/2021 08:08    PHYSICAL  EXAM  Pulse Rate:  [56-85] 79 (12/30 0902) Resp:  [0-28] 24 (12/30 0902) BP: (79-120)/(43-63) 103/58 (12/30 0900) SpO2:  [99 %-100 %] 100 % (12/30 1118) FiO2 (%):  [40 %] 40 % (12/30 1118) Weight:  [86.4 kg] 86.4 kg (12/30 0500)  General - Well nourished, well developed, intubated on propofol.  Ophthalmologic - fundi not visualized due to noncooperation.  Cardiovascular - Regular rate and rhythm.  Neuro - intubated on sedation, eyes closed, not following commands. With forced eye opening, eyes in need position, not blinking to visual threat, doll's eyes present, not tracking, PERRL present. Corneal reflex present on the right, gag and cough present. Breathing over the vent.  Facial symmetry not able to test due to ET tube.  Tongue protrusion not cooperative. On pain stimulation, withdrawal right upper extremity and bilateral lower extremity, however no movement on left upper extremity.Sensation, coordination and gait not tested.   ASSESSMENT/PLAN Mr. SADAT SLIWA is a 75 y.o. male with history of metastatic melanoma, lung cancer, urothelia carcinoma, tobacco abuse, thrombocytopenia and recent surgery for R shoulder impingement presenting after being found down by family.  EMS was called.  He was found to be hypotensive and hypoxic, and  right MCA stroke was found on CT.  He was incidentally found to be COVID positive.  He was outside of the window for TNK, and the risks of thrombectomy were felt to outweigh the potential benefits.  Stroke:  right MCA scattered infarcts, could be due to carotid stenosis in the setting of septic shock  CT head Ill-defined hypodensity in right basal ganglia and right frontal lobe concerning for acute right MCA territory stroke MRI  scattered cortical and subcortical acute infarcts in right MCA distribution MRA  70% stenosis at right M1 segment. Carotid Doppler  40-59% stenosis in right ICA, 60-79% stenosis in left ICA 2D Echo EF 96-29%, grade 1 diastolic  dysfunction, no atrial level shunt LDL 71 HgbA1c 7.6 VTE prophylaxis - SCDs aspirin 81 mg daily prior to admission, now on No antithrombotic secondary to GI bleed.  Consider antiplatelet once cleared by GI Therapy recommendations:  pending Disposition:  pending  GI bleed S/p banding of esophageal varices Do not reinsert OG tube per GI Monitor H/H, transfuse as needed Management per primary team  Respiratory failure Septic shock Intubated due to inability to protect airway Not candidate for extubation today On Rocephin Management per CCM.  Hyperammonemia Ammonia level 115 on lactulose rectally repeat ammonia level 73.    ?  Cirrhosis Significantly elevated AST and ALT 1186/735 INR elevated 1.5 Jaundice esophageal varices On octreotide  AKI creatinine 2.47->2.64-> 4.02-> 4.38 Management per CCM  leukocytosis  WBC 26.6->27.4-> 20.1-> 15.3->15.4 On Rocephin  Hypertension Home meds:  lisinopril 10 mg daily Stable Long-term BP goal normotensive  Hyperlipidemia Home meds:  atorvastatin 10 mg daily, not resumed in hospital as elevated LFTs LDL 71, goal < 70 Resume lipitor once LFT normalizes   Diabetes type II Uncontrolled Home meds:  none HgbA1c 7.6, goal < 7.0 CBGs Management per CCM SSI  COVID infection On airborne isolation Management per CCM  Other Stroke Risk Factors Advanced Age >/= 46  Cigarette smoker - cessation education will be provided later   Hospital day # 3  This patient is critically ill due to septic shock, cirrhosis, stroke, respiratory failure, GI bleeding, and worsening AKI and at significant risk of neurological worsening, death form severe sepsis, renal failure, liver failure, restaurant failure, septic shock. This patient's care requires constant monitoring of vital signs, hemodynamics, respiratory and cardiac monitoring, review of multiple databases, neurological assessment, discussion with family, other specialists and medical  decision making of high complexity. I spent 35 minutes of neurocritical care time in the care of this patient. I discussed with Dr. Kimber Relic from [place].  Neurology will sign off for now. Please feel free to call with questions. Pt can follow up with stroke clinic NP at [facility] in about 4 weeks after discharge. Thanks for the consult.   Rosalin Hawking, MD PhD Stroke Neurology 01/01/2021 1:16 PM    To contact Stroke Continuity provider, please refer to [email or web address] After hours, contact General Neurology

## 2021-01-01 NOTE — Progress Notes (Addendum)
Patient ID: Dalton Miller, male   DOB: 07-21-1945, 75 y.o.   MRN: [id number]    Progress Note   Subjective  Day # 4 CC: acute right MCA CVA, requiring intubation, acute upper GI bleed secondary to esophageal varices, sepsis, COVID-19 infection  IV PPI Octreotide day #3  Labs today-WBC 15.4, hemoglobin 7.8/hematocrit 23.2 stable, Edward stools. INR 1.2 Lactic acid 1.5 BUN 129/creatinine 4.38 T bili 2.5/alk phos 94/AST 247/ALT 348.  Improved  No further active bleeding, remains intubated, not alert    Objective   Vital signs in last 24 hours: Pulse Rate:  [56-85] 79 (12/30 0902) Resp:  [0-28] 24 (12/30 0902) BP: (79-120)/(43-63) 103/58 (12/30 0900) SpO2:  [99 %-100 %] 100 % (12/30 0902) FiO2 (%):  [40 %] 40 % (12/30 0902) Weight:  [86.4 kg] 86.4 kg (12/30 0500) Last BM Date: 12/31/20  General: Elderly white male, obtunded, intubated-blood pressure 84/45 Heart:  Regular rate and rhythm; no murmurs Lungs: Respirations even and unlabored, lungs CTA bilaterally Abdomen:  Soft, protuberant and full feeling, bowel sounds present no definite fluid wave but query ascites  Extremities:  Without edema. Neurologic: Intubated and obtunded   Intake/Output from previous day: 12/29 0701 - 12/30 0700 In: 3285.5 [I.V.:3085.5; IV Piggyback:200] Out: 900 [Urine:900] Intake/Output this shift: Total I/O In: 148.8 [I.V.:148.8] Out: -   Lab Results: Recent Labs    12/31/20 1138 12/31/20 1823 01/01/21 0409  WBC 20.1* 15.3* 15.4*  HGB 7.9* 7.3* 7.8*  HCT 22.2* 21.2* 23.2*  PLT 175 164 203   BMET Recent Labs    12/30/20 0421 12/31/20 1016 01/01/21 0409  NA 140 135 139  K 5.5* 5.3* 4.8  CL 104 101 105  CO2 18* 19* 17*  GLUCOSE 375* 66* 128*  BUN 114* 133* 129*  CREATININE 2.64* 4.02* 4.38*  CALCIUM 7.0* 6.3* 6.6*   LFT Recent Labs    12/30/20 0006 12/31/20 1016 01/01/21 0409  PROT 4.2*   < > 4.3*  ALBUMIN 2.1*   < > 1.8*  AST 1,186*   < > 247*  ALT 735*   < > 348*   ALKPHOS 176*   < > 94  BILITOT 3.4*   < > 2.5*  BILIDIR 1.7*  --   --   IBILI 1.7*  --   --    < > = values in this interval not displayed.   PT/INR Recent Labs    12/30/20 0006 01/01/21 0409  LABPROT 17.9* 14.9  INR 1.5* 1.2    Studies/Results: MR ANGIO HEAD WO CONTRAST  Result Date: 12/31/2020 CLINICAL DATA:  Stroke follow up. EXAM: MRI HEAD WITHOUT CONTRAST MRA HEAD WITHOUT CONTRAST TECHNIQUE: Multiplanar, multi-echo pulse sequences of the brain and surrounding structures were acquired without intravenous contrast. Angiographic images of the Circle of Willis were acquired using MRA technique without intravenous contrast. COMPARISON:  2 days ago. FINDINGS: MRI HEAD FINDINGS Brain: Patchy restricted diffusion along the right cerebral convexity involving the frontal, parietal, and occipital cortex. Restricted diffusion and swelling at the right stratum with mild petechial hemorrhage. Mild chronic small vessel ischemia in the hemispheric white matter no hydrocephalus, shift, or collection. Vascular: See below. Skull and upper cervical spine: Normal marrow signal Sinuses/Orbits: Minor mastoid and sinus opacification in the setting of nasopharyngeal fluid. MRA HEAD FINDINGS Anterior circulation: Atheromatous irregularity of the cavernous carotids. 70% right M1 segment stenosis with wasted appearance rather than the appearance of central clot/embolism. No beading or aneurysm. Posterior circulation: The vertebral and basilar arteries  are smoothly contoured and widely patent. Proximal basilar fenestration. IMPRESSION: 1. Scattered cortical and subcortical acute infarcts in the right MCA distribution with mild petechial hemorrhage. 2. 70% stenosis at the right M1 segment. Electronically Signed   By: Jorje Guild M.D.   On: 12/31/2020 07:12   MR BRAIN WO CONTRAST  Result Date: 12/31/2020 CLINICAL DATA:  Stroke follow up. EXAM: MRI HEAD WITHOUT CONTRAST MRA HEAD WITHOUT CONTRAST TECHNIQUE:  Multiplanar, multi-echo pulse sequences of the brain and surrounding structures were acquired without intravenous contrast. Angiographic images of the Circle of Willis were acquired using MRA technique without intravenous contrast. COMPARISON:  2 days ago. FINDINGS: MRI HEAD FINDINGS Brain: Patchy restricted diffusion along the right cerebral convexity involving the frontal, parietal, and occipital cortex. Restricted diffusion and swelling at the right stratum with mild petechial hemorrhage. Mild chronic small vessel ischemia in the hemispheric white matter no hydrocephalus, shift, or collection. Vascular: See below. Skull and upper cervical spine: Normal marrow signal Sinuses/Orbits: Minor mastoid and sinus opacification in the setting of nasopharyngeal fluid. MRA HEAD FINDINGS Anterior circulation: Atheromatous irregularity of the cavernous carotids. 70% right M1 segment stenosis with wasted appearance rather than the appearance of central clot/embolism. No beading or aneurysm. Posterior circulation: The vertebral and basilar arteries are smoothly contoured and widely patent. Proximal basilar fenestration. IMPRESSION: 1. Scattered cortical and subcortical acute infarcts in the right MCA distribution with mild petechial hemorrhage. 2. 70% stenosis at the right M1 segment. Electronically Signed   By: Jorje Guild M.D.   On: 12/31/2020 07:12   DG Chest Port 1 View  Result Date: 01/01/2021 CLINICAL DATA:  Acute respiratory failure; COVID positive EXAM: PORTABLE CHEST 1 VIEW COMPARISON:  12/31/2020 FINDINGS: Endotracheal tube is unchanged. Persistent opacity at the right lung base with change in configuration. No pleural effusion or pneumothorax. Stable cardiomediastinal contours. IMPRESSION: Right basilar atelectasis/consolidation. Electronically Signed   By: Macy Mis M.D.   On: 01/01/2021 08:08   DG Chest Port 1 View  Result Date: 12/31/2020 CLINICAL DATA:  Intubation, CVA EXAM: PORTABLE CHEST 1  VIEW COMPARISON:  Chest x-ray 12/29/2020 FINDINGS: Endotracheal tube tip is 4.7 cm above the carina. Enteric tube has been removed. Linear opacities in the right lower lung zone likely represent subsegmental atelectasis. No pleural effusion or pneumothorax identified. IMPRESSION: 1. Medical devices as described. 2. Linear opacities in the right lower lung zone likely represent subsegmental atelectasis. Electronically Signed   By: Ofilia Neas M.D.   On: 12/31/2020 08:50   VAS US CAROTID  Result Date: 12/30/2020 Carotid Arterial Duplex Study Patient Name:  TROYCE GIESKE  Date of Exam:   12/30/2020 Medical Rec #: [id number]     Accession #:    [id number] Date of Birth: 1945-10-05      Patient Gender: M Patient Age:   64 years Exam Location:  [facility] Procedure:      VAS US CAROTID Referring Phys: Rosalin Hawking --------------------------------------------------------------------------------  Indications:       CVA and altered mental status after a fall at home. Risk Factors:      Hypertension, hyperlipidemia, Diabetes. Comparison Study:  No prior. Performing Technologist: Oda Cogan RDMS, RVT  Examination Guidelines: A complete evaluation includes B-mode imaging, spectral Doppler, color Doppler, and power Doppler as needed of all accessible portions of each vessel. Bilateral testing is considered an integral part of a complete examination. Limited examinations for reoccurring indications may be performed as noted.  Right Carotid Findings: +----------+--------+--------+--------+------------------+--------+  PSV cm/s EDV cm/s Stenosis Plaque Description Comments  +----------+--------+--------+--------+------------------+--------+  CCA Prox   99       27                                             +----------+--------+--------+--------+------------------+--------+  CCA Distal 89       31                                              +----------+--------+--------+--------+------------------+--------+  ICA Prox   120      43       40-59%   calcific                     +----------+--------+--------+--------+------------------+--------+  ICA Mid    140      57       40-59%   calcific                     +----------+--------+--------+--------+------------------+--------+  ICA Distal 136      53                                             +----------+--------+--------+--------+------------------+--------+ +----------+--------+-------+----------------+-------------------+             PSV cm/s EDV cms Describe         Arm Pressure (mmHG)  +----------+--------+-------+----------------+-------------------+  Subclavian 98               Multiphasic, WNL                      +----------+--------+-------+----------------+-------------------+ +---------+--------+---+--------+--+---------+  Vertebral PSV cm/s 109 EDV cm/s 32 Antegrade  +---------+--------+---+--------+--+---------+  Left Carotid Findings: +----------+--------+--------+--------+------------------+--------+             PSV cm/s EDV cm/s Stenosis Plaque Description Comments  +----------+--------+--------+--------+------------------+--------+  CCA Prox   98       25                                             +----------+--------+--------+--------+------------------+--------+  CCA Distal 88       24                                             +----------+--------+--------+--------+------------------+--------+  ICA Prox   213      73       60-79%   heterogenous                 +----------+--------+--------+--------+------------------+--------+  ICA Mid    148      55       40-59%   heterogenous                 +----------+--------+--------+--------+------------------+--------+  ICA Distal 115      49                                             +----------+--------+--------+--------+------------------+--------+  ECA        107      21                                              +----------+--------+--------+--------+------------------+--------+ +----------+--------+--------+----------------+-------------------+             PSV cm/s EDV cm/s Describe         Arm Pressure (mmHG)  +----------+--------+--------+----------------+-------------------+  Subclavian 134               Multiphasic, WNL                      +----------+--------+--------+----------------+-------------------+ +---------+--------+--+--------+--+---------+  Vertebral PSV cm/s 89 EDV cm/s 25 Antegrade  +---------+--------+--+--------+--+---------+   Summary: Right Carotid: Velocities in the right ICA are consistent with a 40-59%                stenosis. Left Carotid: Velocities in the left ICA are consistent with a 60-79% stenosis. Vertebrals:  Bilateral vertebral arteries demonstrate antegrade flow. Subclavians: Normal flow hemodynamics were seen in bilateral subclavian              arteries. *See table(s) above for measurements and observations.  Electronically signed by Jamelle Haring on 12/30/2020 at 3:19:41 PM.    Final        Assessment / Plan:    #77 75 year old white male admitted with acute right MCA stroke with scattered acute infarcts noted in the right MCA distribution on MRI  #2 acute upper GI bleed secondary to esophageal varices, status post banding x 4 01/31/2020 No active bleeding since banding Remains on IV PPI and IV octreotide  #3 anemia multifactorial stable  #4 acute metabolic encephalopathy-multifactorial  #5 acute COVID-19 infection with respiratory failure/sepsis  #6 acute kidney injury-solitary kidney status post unilateral nephrectomy-parameters worsening  #7 Elevated LFTs - no evidence of cirrhosis by ultrasound however found to have varices on EGD and therefore has portal hypertension.  Patient needs CT of the abdomen and pelvis to further evaluate the liver and abdominal vessels. Would prefer to do with contrast however will proceed as may get some information from  noncontrasted study.  #8 history of chronic thrombocytopenia, had been on Promacta as an outpatient on hold currently  #9 history of metastatic melanoma to the lung  #10 history of renal cell CA status post nephrectomy  Plan:  From GI perspective would like to wait at least another 24 hours post banding of esophageal varices before placement of any sort of enteral feeding tube Complete 72 hours of octreotide IV then discontinue Continue IV PPI twice daily Urine sodium  Would like to go ahead with CT of the abdomen and pelvis noncontrasted-we will defer to critical care to order when patient is stable enough to be transported    LOS: 3 days   Amy Esterwood PA-C 01/01/2021, 10:38 AM     Attending Physician Note   I have taken an interval history, reviewed the chart and examined the patient. I personally saw the patient and performed a substantive portion of this encounter, including a complete performance of at least one of the key components, in conjunction with the APP. I agree with the APP's note, impression and recommendations.   Lucio Edward, MD [facility] See AMION, [facility], for our on call provider

## 2021-01-02 ENCOUNTER — Inpatient Hospital Stay (HOSPITAL_COMMUNITY): Payer: No Typology Code available for payment source

## 2021-01-02 DIAGNOSIS — J9601 Acute respiratory failure with hypoxia: Secondary | ICD-10-CM | POA: Diagnosis not present

## 2021-01-02 DIAGNOSIS — I8511 Secondary esophageal varices with bleeding: Secondary | ICD-10-CM | POA: Diagnosis not present

## 2021-01-02 DIAGNOSIS — R7989 Other specified abnormal findings of blood chemistry: Secondary | ICD-10-CM | POA: Diagnosis not present

## 2021-01-02 LAB — COMPREHENSIVE METABOLIC PANEL
ALT: 231 U/L — ABNORMAL HIGH (ref 0–44)
AST: 108 U/L — ABNORMAL HIGH (ref 15–41)
Albumin: 1.7 g/dL — ABNORMAL LOW (ref 3.5–5.0)
Alkaline Phosphatase: 91 U/L (ref 38–126)
Anion gap: 14 (ref 5–15)
BUN: 112 mg/dL — ABNORMAL HIGH (ref 8–23)
CO2: 19 mmol/L — ABNORMAL LOW (ref 22–32)
Calcium: 6.9 mg/dL — ABNORMAL LOW (ref 8.9–10.3)
Chloride: 109 mmol/L (ref 98–111)
Creatinine, Ser: 3.96 mg/dL — ABNORMAL HIGH (ref 0.61–1.24)
GFR, Estimated: 15 mL/min — ABNORMAL LOW (ref >60)
Glucose, Bld: 224 mg/dL — ABNORMAL HIGH (ref 70–99)
Potassium: 4 mmol/L (ref 3.5–5.1)
Sodium: 142 mmol/L (ref 135–145)
Total Bilirubin: 2.3 mg/dL — ABNORMAL HIGH (ref 0.3–1.2)
Total Protein: 4.3 g/dL — ABNORMAL LOW (ref 6.5–8.1)

## 2021-01-02 LAB — CBC
HCT: 21.3 % — ABNORMAL LOW (ref 39.0–52.0)
Hemoglobin: 7 g/dL — ABNORMAL LOW (ref 13.0–17.0)
MCH: 32.1 pg (ref 26.0–34.0)
MCHC: 32.9 g/dL (ref 30.0–36.0)
MCV: 97.7 fL (ref 80.0–100.0)
Platelets: 231 10*3/uL (ref 150–400)
RBC: 2.18 MIL/uL — ABNORMAL LOW (ref 4.22–5.81)
RDW: 18.2 % — ABNORMAL HIGH (ref 11.5–15.5)
WBC: 10.4 10*3/uL (ref 4.0–10.5)
nRBC: 0.2 % (ref 0.0–0.2)

## 2021-01-02 LAB — TRIGLYCERIDES: Triglycerides: 319 mg/dL — ABNORMAL HIGH (ref <150)

## 2021-01-02 LAB — GLUCOSE, CAPILLARY
Glucose-Capillary: 124 mg/dL — ABNORMAL HIGH (ref 70–99)
Glucose-Capillary: 143 mg/dL — ABNORMAL HIGH (ref 70–99)
Glucose-Capillary: 144 mg/dL — ABNORMAL HIGH (ref 70–99)
Glucose-Capillary: 172 mg/dL — ABNORMAL HIGH (ref 70–99)
Glucose-Capillary: 213 mg/dL — ABNORMAL HIGH (ref 70–99)
Glucose-Capillary: 327 mg/dL — ABNORMAL HIGH (ref 70–99)
Glucose-Capillary: 36 mg/dL — CL (ref 70–99)
Glucose-Capillary: 97 mg/dL (ref 70–99)

## 2021-01-02 LAB — MAGNESIUM: Magnesium: 1.9 mg/dL (ref 1.7–2.4)

## 2021-01-02 LAB — PHOSPHORUS: Phosphorus: 4.5 mg/dL (ref 2.5–4.6)

## 2021-01-02 MED ORDER — DEXTROSE 50 % IV SOLN: 25.0000 g | INTRAVENOUS | Status: AC

## 2021-01-02 MED ORDER — NOREPINEPHRINE 4 MG/250ML-% IV SOLN
0.0000 ug/min | INTRAVENOUS | Status: DC
  Administered 2021-01-02 – 2021-01-03 (×2): 2 ug/min via INTRAVENOUS
  Administered 2021-01-03: 3 ug/min via INTRAVENOUS
  Filled 2021-01-02 (×2): qty 250

## 2021-01-02 MED ORDER — DEXTROSE 50 % IV SOLN
INTRAVENOUS | Status: AC
  Administered 2021-01-02: 25 g via INTRAVENOUS
  Filled 2021-01-02: qty 50

## 2021-01-02 MED ORDER — SODIUM CHLORIDE 0.9 % IV SOLN
250.0000 mL | INTRAVENOUS | Status: DC
  Administered 2021-01-02: 250 mL via INTRAVENOUS

## 2021-01-02 NOTE — Progress Notes (Signed)
[facility] Progress Note Patient Name: Dalton Miller DOB: August 07, 1945 MRN: [id number]   Date of Service  01/02/2021  HPI/Events of Note  Hemoglobin 7.0 gm/ dl, no evidence of active bleeding.  eICU Interventions  Transfuse 1 unit PRBC for Hemoglobin < 7 gm / dl.        Kerry Kass Tawanda Schall 01/02/2021, 5:36 AM

## 2021-01-02 NOTE — Plan of Care (Signed)
Patient remains withdrawn non interactive but will wiggle toes at times when asked.    Problem: Education: Goal: Knowledge of disease or condition will improve Outcome: Not Progressing Goal: Knowledge of secondary prevention will improve (SELECT ALL) Outcome: Not Progressing Goal: Knowledge of patient specific risk factors will improve (INDIVIDUALIZE FOR PATIENT) Outcome: Not Progressing Goal: Individualized Educational Video(s) Outcome: Not Progressing   Problem: Education: Goal: Knowledge of disease or condition will improve Outcome: Not Progressing Goal: Knowledge of secondary prevention will improve (SELECT ALL) Outcome: Not Progressing Goal: Knowledge of patient specific risk factors will improve (INDIVIDUALIZE FOR PATIENT) Outcome: Not Progressing   Problem: Coping: Goal: Will verbalize positive feelings about self Outcome: Not Progressing Goal: Will identify appropriate support needs Outcome: Not Progressing   Problem: Health Behavior/Discharge Planning: Goal: Ability to manage health-related needs will improve Outcome: Not Progressing   Problem: Self-Care: Goal: Ability to participate in self-care as condition permits will improve Outcome: Not Progressing Goal: Verbalization of feelings and concerns over difficulty with self-care will improve Outcome: Not Progressing   Problem: Nutrition: Goal: Risk of aspiration will decrease Outcome: Not Progressing   Problem: Ischemic Stroke/TIA Tissue Perfusion: Goal: Complications of ischemic stroke/TIA will be minimized Outcome: Not Progressing

## 2021-01-02 NOTE — Progress Notes (Signed)
[place] Progress Note Patient Name: Dalton Miller DOB: [birth year] MRN: [id number]   Date of Service  01/02/2021  HPI/Events of Note  Patient with soft blood pressures and MAP dipping below 65 mmHg, CVP is 14. HR 55 bpm.  eICU Interventions  Peripheral Norepinephrine gtt ordered targeting a MAP > 65.        Kerry Kass Corin Tilly 01/02/2021, 6:52 AM

## 2021-01-02 NOTE — Progress Notes (Addendum)
Patient ID: Dalton Miller, male   DOB: 30-May-1945, 75 y.o.   MRN: [id number]    Progress Note   Subjective  Day # 5 CC: acute right MCA CVA, requiring intubation, acute upper GI bleed secondary to esophageal varices, sepsis, COVID-19 infection  IV PPI twice daily IV octreotide  Labs today-WBC 10.4/hemoglobin 7/hematocrit 21.3/platelets 231 BUN 112/creatinine 3.96 T bili 2.3/alk phos 91/AST 108/ALT 231-improved  Continued intermittent hypotension requiring low-dose pressor  Nursing reports brown stool, no melena, no hematemesis Patient is responding to commands    Objective   Vital signs in last 24 hours: Temp:  [97 F (36.1 C)-98.4 F (36.9 C)] 98.4 F (36.9 C) (12/31 1200) Pulse Rate:  [51-83] 69 (12/31 1200) Resp:  [0-28] 22 (12/31 1200) BP: (83-148)/(47-95) 131/71 (12/31 1200) SpO2:  [88 %-100 %] 100 % (12/31 1200) FiO2 (%):  [40 %] 40 % (12/31 1200) Last BM Date: 01/02/21 General:    Elderly white male, intubated    Intake/Output from previous day: 12/30 0701 - 12/31 0700 In: 3084.2 [I.V.:2584.2; IV Piggyback:200] Out: 6073 [XTGGY:6948; Stool:675] Intake/Output this shift: Total I/O In: 546 [I.V.:514.9; Other:300; IV Piggyback:100.1] Out: 660 [Urine:260; Stool:400]  Lab Results: Recent Labs    12/31/20 1823 01/01/21 0409 01/02/21 0407  WBC 15.3* 15.4* 10.4  HGB 7.3* 7.8* 7.0*  HCT 21.2* 23.2* 21.3*  PLT 164 203 231   BMET Recent Labs    12/31/20 1016 01/01/21 0409 01/02/21 0407  NA 135 139 142  K 5.3* 4.8 4.0  CL 101 105 109  CO2 19* 17* 19*  GLUCOSE 66* 128* 224*  BUN 133* 129* 112*  CREATININE 4.02* 4.38* 3.96*  CALCIUM 6.3* 6.6* 6.9*   LFT Recent Labs    01/02/21 0407  PROT 4.3*  ALBUMIN 1.7*  AST 108*  ALT 231*  ALKPHOS 91  BILITOT 2.3*   PT/INR Recent Labs    01/01/21 0409  LABPROT 14.9  INR 1.2    Studies/Results: DG Chest Port 1 View  Result Date: 01/02/2021 CLINICAL DATA:  Respiratory failure with hypoxia. Altered  mental status. EXAM: PORTABLE CHEST 1 VIEW COMPARISON:  01/01/2021 FINDINGS: The ETT tip is stable above the carina. Interval placement of left arm PICC line with tip in the cavoatrial junction. Stable cardiomediastinal contours. The lung volumes are low and there is atelectasis noted within both lung bases. IMPRESSION: 1. Satisfactory position of left arm PICC line. 2. Low lung volumes with bibasilar atelectasis. Electronically Signed   By: Kerby Moors M.D.   On: 01/02/2021 12:54   DG Chest Port 1 View  Result Date: 01/01/2021 CLINICAL DATA:  Acute respiratory failure; COVID positive EXAM: PORTABLE CHEST 1 VIEW COMPARISON:  12/31/2020 FINDINGS: Endotracheal tube is unchanged. Persistent opacity at the right lung base with change in configuration. No pleural effusion or pneumothorax. Stable cardiomediastinal contours. IMPRESSION: Right basilar atelectasis/consolidation. Electronically Signed   By: Macy Mis M.D.   On: 01/01/2021 08:08   [place] EKG SITE RITE  Result Date: 01/01/2021 If Site Rite image not attached, placement could not be confirmed due to current cardiac rhythm.      Assessment / Plan:    #81 75 year old white male with acute right MCA stroke, scattered acute infarcts in the right MCA distribution on MRI  #2 acute upper GI bleed secondary to esophageal varices-status post banding x4 on 01/31/2020  No further active bleeding, octreotide will be discontinued this afternoon Hemoglobin has drifted but no evidence for active bleeding  #3 anemia multifactorial-hemoglobin down  to 7 today #4 acute metabolic encephalopathy-multifactorial-receiving lactulose enemas #5 acute COVID-19 infection with respiratory failure/sepsis-parameters improving, WBC has normalized, platelets normal Still requiring low-dose pressors  #6 acute kidney injury setting of solitary kidney status post nephrectomy for renal cell CVA Creatinine trending down today Sodium normal  #7 Elevated LFTs-continue  to improve Probably reactive secondary to sepsis, COVID-19 No evidence for cirrhosis by ultrasound Needs CT to further evaluate abdomen-rule out other causes of portal hypertension  #8 history of metastatic melanoma to the lung #9 renal cell CA status post nephrectomy  Plan:  Discontinue octreotide this afternoon Continue IV PPI twice daily for now Will defer to CCM, transfuse for hemoglobin less than 7 We will proceed with CT of the abdomen pelvis without contrast this will be ordered for tomorrow May be able to place Dobbhoff tomorrow which will be 5 days out from variceal banding    LOS: 4 days   Amy Esterwood PA-C 01/02/2021, 1:22 PM     Attending Physician Note   I have taken an interval history, reviewed the chart and examined the patient. I personally saw the patient and performed a substantive portion of this encounter, including a complete performance of at least one of the key components, in conjunction with the APP. I agree with the APP's note, impression and recommendations.   *Esophageal variceal bleed post banding without recurrent bleeding. CT AP to evaluate for cause of varices. DC octreotide this afternoon.   *Anemia multifactorial. Continue to trend. Defer transfusion decisions to CCM.   *Elevated LFTs, steadily improving. Likely due to shock liver, sepsis or Covid-19.  *Acute right MCA CVA  *AKI with solitary kidney  Lucio Edward, MD [facility] See AMION, [facility], for our on call provider

## 2021-01-02 NOTE — Progress Notes (Addendum)
NAME:  Dalton Miller, MRN:  [id number], DOB:  09-Apr-1945, LOS: 4 ADMISSION DATE:  12/29/2020, CONSULTATION DATE:  12/29/20 REFERRING MD:  Eulis Foster - [facility], CHIEF COMPLAINT:  CVA and GIB    History of Present Illness:   75 yo with complex PMH including metastatic melanoma, lung cancer, urothelial carcinoma, ongoing tobacco use disorder,  thrombocytopenia (felt to be immunotherapy related ITP), recent R shoulder surgery for impingement + OA (12/17/20), presented to [place] 12/27 with AMS after possible fall  Pt LKW 12/26 around 2100. Found 12/27 around 0600, unresponsive beside toilet. It is not known how long the patient was downed. He was reportedly pale and yellow appearing. Taken to [facility] where he was noted to be hypoxic SpO2 70s, hypotensive 74/50. Family states that 12/26 pt complained of upper GI pain and had a black stool.   Pt was obtunded in ED and required intubation. CT H obtained and revealed acute R MCA CVA. OGT place and promptly yielded >500 ml coffee ground emesis. His hgb is 7.1 in the ED -- received 1 PRBC. His COVID test has also resulted positive.   PCCM consulted for admission in this setting   Pertinent  Medical History  Metastatic melanoma , left lung  Recurrent urothelial cancer DM2 HTN HLD Emphysema  S/p nephrectomy   Significant Hospital Events: Including procedures, antibiotic start and stop dates in addition to other pertinent events   12/27 [facility] for AMS. Obtunded, intubated. R MCA stroke, GIB, AKI, Acute liver injury. Transferring to [facility].  COVID PCR is positive 12/28 EGD with findings of esophageal varices which were banded 12/31 has intermittent hypotension requiring Levophed.  PICC line placed on 12/30 PICC line placed yesterday.   Interim History / Subjective:   Blood sugars are variable Briefly on Levophed  Objective   Blood pressure (!) 88/51, pulse (!) 56, temperature (!) 97.2 F (36.2 C), resp. rate (!) 22, weight 86.4 kg, SpO2 100 %. CVP:  [0 mmHg-14  mmHg] 14 mmHg  Vent Mode: PRVC FiO2 (%):  [40 %] 40 % Set Rate:  [22 bmp] 22 bmp Vt Set:  [530 mL] 530 mL PEEP:  [5 cmH20] 5 cmH20 Plateau Pressure:  [19 cmH20-20 cmH20] 19 cmH20   Intake/Output Summary (Last 24 hours) at 01/02/2021 0741 Last data filed at 01/02/2021 0700 Gross per 24 hour  Intake 3084.2 ml  Output 1850 ml  Net 1234.2 ml   Filed Weights   12/30/20 0425 12/31/20 0500 01/01/21 0500  Weight: 77.1 kg 77 kg 86.4 kg    Examination: Gen:      No acute distress HEENT:  EOMI, sclera anicteric Neck:     No masses; no thyromegaly, ETT Lungs:    Clear to auscultation bilaterally; normal respiratory effort CV:         Regular rate and rhythm; no murmurs Abd:      + bowel sounds; soft, non-tender; no palpable masses, no distension Ext:    No edema; adequate peripheral perfusion Skin:      Warm and dry; no rash Neuro: Sedated  Lab/imaging reviewed Creatinine slightly better at 3.96, transaminases continue to improve Hemoglobin drifting down to 7  Resolved Hospital Problem list   AGMA, severe Lactic Acidosis Acute thrombocytosis (? Component of Hemoconcentration)   Assessment & Plan:  Acute R MCA territory infarct  -involving R basal ganglia, R frontal lobe  -notably, is typically thrombocytopenic but on admission plt are >500  P Appreciate neurology input.   MRI reviewed with MCA  infarct Echo with normal EF, diastolic dysfunction  Acute metabolic encephalopathy: multifactorial  -hyperammonemia (115) , acute liver injury, AKI, sepsis, stroke, hypoxia  P Continue lactulose rectal  Acute respiratory failure with hypoxia -POA COVID-19 infection -POA P Suspect COVID-19 infection is incidental as chest x-ray appears clear. Cannot get remdesivir and molnupiravir due to liver failure. Paxlovid cannot be crushed in feeding tube Supportive care Steroids if he deteriorates  Suspected severe sepsis -POA. Source unclear.  -WBC 40.7. Possible component of  hemoconcentration contributing but picture is c/w severe sepsis  P DC Vanco as MRSA is negative Narrowed abx to rocephin to cover possible CAP. Day 5/7  AKI Solitary kidney P Follow urine output and creatinine  Acute liver injury - POA Hyperammonemia -POA GIB, acute blood loss anemia EGD showed varices s/p banding -acutely elevated LFTs. 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  P Hold outpt promacta (25mg  qD)   Metastatic recurrent melanoma - POA -L upper lung lesion, mediastinal adenopathy  -s/p 4 cycles ipi/nivo, maintenance nivo dc due to ITP Recurrent low grade urothelial carcinoma s/p TURBT, repeat TURBT x2 - POA P Supportive care  Daughter updated over telephone 12/31  Best Practice (right click and "Reselect all SmartList Selections" daily)   Diet/type: NPO DVT prophylaxis: SCD GI prophylaxis: PPI Lines: Central line Foley:  Yes, and it is still needed Code Status:  full code Last date of multidisciplinary goals of care discussion [12/28] Family updated on 12/30. Remains full code  Critical care time:    The patient is critically ill with multiple organ system failure and requires high complexity decision making for assessment and support, frequent evaluation and titration of therapies, advanced monitoring, review of radiographic studies and interpretation of complex data.   Critical Care Time devoted to patient care services, exclusive of separately billable procedures, described in this note is 35 minutes.   Marshell Garfinkel MD [place] [facility] See Amion for pager  If no response to pager , please call [phone] until 7pm After 7:00 pm call Elink   [phone] 01/02/2021, 7:41 AM

## 2021-01-02 NOTE — Progress Notes (Signed)
Contacted Elink for pt's hypotension.  Instructed to get a CVP.  Pt's CVP is 14.  Notified Elink.  Will continue to monitor.  Waiting for orders.

## 2021-01-03 ENCOUNTER — Inpatient Hospital Stay (HOSPITAL_COMMUNITY): Payer: No Typology Code available for payment source

## 2021-01-03 ENCOUNTER — Encounter (HOSPITAL_COMMUNITY): Payer: Self-pay | Admitting: Gastroenterology

## 2021-01-03 DIAGNOSIS — R7989 Other specified abnormal findings of blood chemistry: Secondary | ICD-10-CM | POA: Diagnosis not present

## 2021-01-03 DIAGNOSIS — I8511 Secondary esophageal varices with bleeding: Secondary | ICD-10-CM | POA: Diagnosis not present

## 2021-01-03 DIAGNOSIS — J9601 Acute respiratory failure with hypoxia: Secondary | ICD-10-CM | POA: Diagnosis not present

## 2021-01-03 LAB — CULTURE, BLOOD (ROUTINE X 2)
Culture: NO GROWTH
Culture: NO GROWTH
Special Requests: ADEQUATE

## 2021-01-03 LAB — CBC
HCT: 21.6 % — ABNORMAL LOW (ref 39.0–52.0)
Hemoglobin: 6.8 g/dL — CL (ref 13.0–17.0)
MCH: 31.9 pg (ref 26.0–34.0)
MCHC: 31.5 g/dL (ref 30.0–36.0)
MCV: 101.4 fL — ABNORMAL HIGH (ref 80.0–100.0)
Platelets: 256 10*3/uL (ref 150–400)
RBC: 2.13 MIL/uL — ABNORMAL LOW (ref 4.22–5.81)
RDW: 18.5 % — ABNORMAL HIGH (ref 11.5–15.5)
WBC: 11.2 10*3/uL — ABNORMAL HIGH (ref 4.0–10.5)
nRBC: 0.2 % (ref 0.0–0.2)

## 2021-01-03 LAB — COMPREHENSIVE METABOLIC PANEL
ALT: 165 U/L — ABNORMAL HIGH (ref 0–44)
AST: 64 U/L — ABNORMAL HIGH (ref 15–41)
Albumin: 1.7 g/dL — ABNORMAL LOW (ref 3.5–5.0)
Alkaline Phosphatase: 107 U/L (ref 38–126)
Anion gap: 10 (ref 5–15)
BUN: 96 mg/dL — ABNORMAL HIGH (ref 8–23)
CO2: 19 mmol/L — ABNORMAL LOW (ref 22–32)
Calcium: 7.3 mg/dL — ABNORMAL LOW (ref 8.9–10.3)
Chloride: 115 mmol/L — ABNORMAL HIGH (ref 98–111)
Creatinine, Ser: 3.87 mg/dL — ABNORMAL HIGH (ref 0.61–1.24)
GFR, Estimated: 16 mL/min — ABNORMAL LOW (ref >60)
Glucose, Bld: 396 mg/dL — ABNORMAL HIGH (ref 70–99)
Potassium: 3.8 mmol/L (ref 3.5–5.1)
Sodium: 144 mmol/L (ref 135–145)
Total Bilirubin: 2 mg/dL — ABNORMAL HIGH (ref 0.3–1.2)
Total Protein: 4.3 g/dL — ABNORMAL LOW (ref 6.5–8.1)

## 2021-01-03 LAB — PREPARE RBC (CROSSMATCH)

## 2021-01-03 LAB — PHOSPHORUS: Phosphorus: 3.6 mg/dL (ref 2.5–4.6)

## 2021-01-03 LAB — GLUCOSE, CAPILLARY
Glucose-Capillary: 119 mg/dL — ABNORMAL HIGH (ref 70–99)
Glucose-Capillary: 173 mg/dL — ABNORMAL HIGH (ref 70–99)
Glucose-Capillary: 150 mg/dL — ABNORMAL HIGH (ref 70–99)
Glucose-Capillary: 134 mg/dL — ABNORMAL HIGH (ref 70–99)
Glucose-Capillary: 152 mg/dL — ABNORMAL HIGH (ref 70–99)
Glucose-Capillary: 136 mg/dL — ABNORMAL HIGH (ref 70–99)

## 2021-01-03 LAB — MAGNESIUM: Magnesium: 1.9 mg/dL (ref 1.7–2.4)

## 2021-01-03 MED ORDER — SODIUM CHLORIDE 0.9% IV SOLUTION: Freq: Once | INTRAVENOUS | Status: DC

## 2021-01-03 MED ORDER — ALTEPLASE 2 MG IJ SOLR: 2.0000 mg | Freq: Once | INTRAMUSCULAR | Status: DC

## 2021-01-03 NOTE — Progress Notes (Signed)
Date and time results received: 01/03/21 0440   Test: Hemoglobin     Critical Value: 6.8  Notified Elink at 0441  Waiting for orders

## 2021-01-03 NOTE — Progress Notes (Signed)
NAME:  Dalton Miller, MRN:  [id number], DOB:  04-16-1945, LOS: 5 ADMISSION DATE:  12/29/2020, CONSULTATION DATE:  12/29/20 REFERRING MD:  Eulis Foster - [facility], CHIEF COMPLAINT:  CVA and GIB    History of Present Illness:   76 yo with complex PMH including metastatic melanoma, lung cancer, urothelial carcinoma, ongoing tobacco use disorder,  thrombocytopenia (felt to be immunotherapy related ITP), recent R shoulder surgery for impingement + OA (12/17/20), presented to [place] 12/27 with AMS after possible fall  Pt LKW 12/26 around 2100. Found 12/27 around 0600, unresponsive beside toilet. It is not known how long the patient was downed. He was reportedly pale and yellow appearing. Taken to [facility] where he was noted to be hypoxic SpO2 70s, hypotensive 74/50. Family states that 12/26 pt complained of upper GI pain and had a black stool.   Pt was obtunded in ED and required intubation. CT H obtained and revealed acute R MCA CVA. OGT place and promptly yielded >500 ml coffee ground emesis. His hgb is 7.1 in the ED -- received 1 PRBC. His COVID test has also resulted positive.   PCCM consulted for admission in this setting   Pertinent  Medical History  Metastatic melanoma , left lung  Recurrent urothelial cancer DM2 HTN HLD Emphysema  S/p nephrectomy   Significant Hospital Events: Including procedures, antibiotic start and stop dates in addition to other pertinent events   12/27 [facility] for AMS. Obtunded, intubated. R MCA stroke, GIB, AKI, Acute liver injury. Transferring to [facility].  COVID PCR is positive 12/28 EGD with findings of esophageal varices which were banded 12/31 has intermittent hypotension requiring Levophed.  PICC line placed on 12/30 PICC line placed yesterday.   Interim History / Subjective:   Off pressors, getting PRBCs for drop in hemoglobin  Objective   Blood pressure (!) 106/58, pulse (!) 55, temperature 98.4 F (36.9 C), temperature source Bladder, resp. rate 18, weight 86.4 kg, SpO2  100 %. CVP:  [10 mmHg-26 mmHg] 14 mmHg  Vent Mode: PRVC FiO2 (%):  [40 %] 40 % Set Rate:  [22 bmp] 22 bmp Vt Set:  [530 mL] 530 mL PEEP:  [5 cmH20] 5 cmH20 Plateau Pressure:  [16 cmH20-21 cmH20] 20 cmH20   Intake/Output Summary (Last 24 hours) at 01/03/2021 1021 Last data filed at 01/03/2021 1009 Gross per 24 hour  Intake 2645.23 ml  Output 1845 ml  Net 800.23 ml   Filed Weights   12/30/20 0425 12/31/20 0500 01/01/21 0500  Weight: 77.1 kg 77 kg 86.4 kg    Examination: Gen:      No acute distress HEENT:  EOMI, sclera anicteric Neck:     No masses; no thyromegaly, ETT Lungs:    Clear to auscultation bilaterally; normal respiratory effort CV:         Regular rate and rhythm; no murmurs Abd:      + bowel sounds; soft, non-tender; no palpable masses, no distension Ext:    No edema; adequate peripheral perfusion Skin:      Warm and dry; no rash Neuro: Somnolent, arousable.  Follows commands on the right  Lab/imaging reviewed Creatinine 3.87 hemoglobin 6.8 CT abdomen pelvis with signs of cirrhosis, low-attenuation lesions in the liver with thickening of stomach wall, lower lobe airspace consolidations and small effusions.  Resolved Hospital Problem list   AGMA, severe Lactic Acidosis Acute thrombocytosis (? Component of Hemoconcentration)   Assessment & Plan:  Acute R MCA territory infarct  -involving R basal ganglia, R frontal  lobe  -notably, is typically thrombocytopenic but on admission plt are >500  P Appreciate neurology input.   MRI reviewed with MCA infarct Echo with normal EF, diastolic dysfunction  Acute metabolic encephalopathy: multifactorial  -hyperammonemia (115) , acute liver injury, AKI, sepsis, stroke, hypoxia  P Continue lactulose rectal  Acute respiratory failure with hypoxia -POA COVID-19 infection -POA P Suspect COVID-19 infection is incidental as chest x-ray appears clear. Cannot get remdesivir and molnupiravir due to liver failure. Paxlovid cannot  be crushed in feeding tube Supportive care Steroids if he deteriorates  Suspected severe sepsis -POA. Source unclear.  -WBC 40.7. Possible component of hemoconcentration contributing but picture is c/w severe sepsis  P DC Vanco as MRSA is negative Narrowed abx to rocephin to cover possible CAP. Day 6/7  AKI Solitary kidney P Follow urine output and creatinine  Acute liver injury - POA Hyperammonemia -POA GIB, acute blood loss anemia EGD showed varices s/p banding -acutely elevated LFTs. Ammonia 115 P Unclear why he has cirrhosis as per family he does not drink and hepatitis panel is negative Upper quadrant ultrasound noted with no acute abnormality CT abd pelvis reviewed IV PPI. 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  P Hold outpt promacta (25mg  qD)   Metastatic recurrent melanoma - POA -L upper lung lesion, mediastinal adenopathy  -s/p 4 cycles ipi/nivo, maintenance nivo dc due to ITP Recurrent low grade urothelial carcinoma s/p TURBT, repeat TURBT x2 - POA P Supportive care  Daughter updated over telephone 12/31  Best Practice (right click and "Reselect all SmartList Selections" daily)   Diet/type: NPO DVT prophylaxis: SCD GI prophylaxis: PPI Lines: Central line Foley:  Yes, and it is still needed Code Status:  full code Last date of multidisciplinary goals of care discussion [12/28] Family updated on 12/30. Remains full code  Critical care time:    The patient is critically ill with multiple organ system failure and requires high complexity decision making for assessment and support, frequent evaluation and titration of therapies, advanced monitoring, review of radiographic studies and interpretation of complex data.   Critical Care Time devoted to patient care services, exclusive of separately billable procedures, described in this note is 35 minutes.   Marshell Garfinkel MD  Pulmonary & Critical care See Amion for pager  If no response to pager , please call [phone] until 7pm After 7:00 pm call Elink  [phone] 01/03/2021, 10:21 AM

## 2021-01-03 NOTE — Progress Notes (Addendum)
Patient ID: Dalton Miller, male   DOB: May 16, 1945, 76 y.o.   MRN: [id number]    Progress Note   Subjective  Day # 6 CC: acute right MCA CVA, requiring intubation, acute upper GI bleed secondary to esophageal varices, sepsis, COVID-19 infection  Continues on IV PPI twice daily Still requiring pressors  Patient is on very low-dose sedation, per nursing will follow commands to wiggle toes etc. No active GI bleeding CT of the abdomen pelvis without contrast-morphologic features of the liver suggestive of early cirrhosis, moderate volume ascites, 2 new low-attenuation structures within the dome of the liver cannot exclude benign or malignant lesion radiology suggest abdominal MRI Wall thickening of the gastric antrum also with several areas of nonspecific bowel wall edema involving the left colon sigmoid and rectum nonspecific in setting of portal venous hypertension bilateral lower lobe airspace consolidation right greater than left suspicious for pneumonia and/or aspiration pneumonia small bilateral effusions  WBC 11.2/hemoglobin 6.8/hematocrit 21.6/platelets 256-using 1 unit BUN 96/creatinine 3.8 T bili 2.0/alk phos 107/AST 64/ALT 165 improved   Objective   Vital signs in last 24 hours: Temp:  [97.3 F (36.3 C)-99 F (37.2 C)] 98.4 F (36.9 C) (01/01 0945) Pulse Rate:  [50-111] 52 (01/01 1400) Resp:  [16-32] 22 (01/01 1400) BP: (94-152)/(53-100) 123/67 (01/01 1400) SpO2:  [79 %-100 %] 100 % (01/01 1457) FiO2 (%):  [40 %] 40 % (01/01 1457) Last BM Date: 01/03/21 General:   Elderly white male intubated, somnolent Heart:  Regular rate and rhythm; no murmurs Lungs: Respirations even and unlabored, lungs CTA bilaterally Abdomen:  Soft, nontender and nondistended.  Full feeling but no definite fluid wave normal bowel sounds. Extremities:  Without edema. Neurologic: Intubated, sedated somnolent   Intake/Output from previous day: 12/31 0701 - 01/01 0700 In: 2911.8 [I.V.:2211.7; IV  Piggyback:100.1] Out: 2130 [Urine:1430; Stool:700] Intake/Output this shift: Total I/O In: 725.6 [I.V.:110.6; Blood:315; Other:300] Out: 100 [Urine:100]  Lab Results: Recent Labs    01/01/21 0409 01/02/21 0407 01/03/21 0351  WBC 15.4* 10.4 11.2*  HGB 7.8* 7.0* 6.8*  HCT 23.2* 21.3* 21.6*  PLT 203 231 256   BMET Recent Labs    01/01/21 0409 01/02/21 0407 01/03/21 0351  NA 139 142 144  K 4.8 4.0 3.8  CL 105 109 115*  CO2 17* 19* 19*  GLUCOSE 128* 224* 396*  BUN 129* 112* 96*  CREATININE 4.38* 3.96* 3.87*  CALCIUM 6.6* 6.9* 7.3*   LFT Recent Labs    01/03/21 0351  PROT 4.3*  ALBUMIN 1.7*  AST 64*  ALT 165*  ALKPHOS 107  BILITOT 2.0*   PT/INR Recent Labs    01/01/21 0409  LABPROT 14.9  INR 1.2    Studies/Results: CT ABDOMEN PELVIS WO CONTRAST  Result Date: 01/03/2021 CLINICAL DATA:  Portal hypertension suspected.  Suspect cirrhosis. EXAM: CT ABDOMEN AND PELVIS WITHOUT CONTRAST TECHNIQUE: Multidetector CT imaging of the abdomen and pelvis was performed following the standard protocol without IV contrast. COMPARISON:  04/04/2019 FINDINGS: Lower chest: Small bilateral pleural effusions. Airspace consolidation is identified within both lower lobes, right greater than left. Hepatobiliary: 9 mm low-attenuation structure within the lateral dome of liver is identified, image 11/3. This is a new finding when compared with the previous exam. Tiny low-density structure within the posterolateral dome of liver measuring less than 1 cm is also noted and appears new compared with the previous exam, image 10/3. Inferior and lateral margin of the right hepatic lobe a Spears slightly irregular which may reflect changes  of early cirrhosis. Mild edema of the gallbladder wall is identified, image 25/3. Nonspecific in the setting of ascites. No calcified gallstones noted. High attenuation material within the gallbladder may reflect gallbladder sludge. Common bile duct is upper limits of  normal measuring 7 mm. Pancreas: Unremarkable. No pancreatic ductal dilatation or surrounding inflammatory changes. Spleen: The spleen measures 13 by 10.4 x 4.5 cm (volume = 320 cm^3). Adrenals/Urinary Tract: Normal right adrenal gland. Unchanged appearance of low-attenuation enlargement of the left adrenal gland, image 25/3. This is similar to 07/02/2015 and is compatible with underlying benign adenomas. Status post left nephrectomy. Right kidney cysts are again noted. The largest arises off the posterior cortex of the inferior pole measuring 2.6 cm. No right-sided hydronephrosis or nephrolithiasis. Urinary bladder is decompressed around a Foley catheter. Stomach/Bowel: Stomach is nondistended. The wall of the gastric antrum appears thickened measuring 1.8 cm, image 22/3. The appendix is not confidently identified. There are scattered areas of bowel wall thickening including the descending colon, proximal sigmoid colon and rectum. Findings are nonspecific in the setting of ascites and suspected portal venous hypertension. Differential considerations include portal hypertensive gastroenterocolopathy as well as inflammatory/infectious colitis. Vascular/Lymphatic: Aortic atherosclerosis. There is no abdominopelvic adenopathy Reproductive: Prostate is unremarkable. Other: Moderate volume of ascites is identified within the abdomen and pelvis. No discrete fluid collections identified. Musculoskeletal: Mild spondylosis within the thoracolumbar spine. Diffuse body wall edema is noted compatible with anasarca. IMPRESSION: 1. Morphologic features of the liver suggestive of early cirrhosis. There is a moderate volume of ascites within the abdomen and pelvis. 2. There are 2 new low-attenuation structures within the dome of liver. These are new when compared with CT from 04/04/2019. Cannot exclude benign or malignant liver lesions. When the patient is clinically stable and able to follow directions and hold their breath  (preferably as an outpatient) further evaluation with dedicated multiphase contrast enhanced abdominal MRI should be considered. 3. Wall thickening of the gastric antrum is identified. Additionally, there are several areas of nonspecific bowel wall edema involving the left colon, sigmoid colon and rectum. Nonspecific in the setting of portal venous hypertension. Findings may reflect inflammatory/infectious colitis versus portal hypertensive gastroenterocolopathy. Clinical correlation advised. 4. Bilateral lower lobe airspace consolidation is noted, right greater than left. Suspicious for pneumonia and or aspiration. 5. Small bilateral pleural effusions. 6. Aortic Atherosclerosis (ICD10-I70.0). Electronically Signed   By: Kerby Moors M.D.   On: 01/03/2021 09:10   DG Chest Port 1 View  Result Date: 01/02/2021 CLINICAL DATA:  Respiratory failure with hypoxia. Altered mental status. EXAM: PORTABLE CHEST 1 VIEW COMPARISON:  01/01/2021 FINDINGS: The ETT tip is stable above the carina. Interval placement of left arm PICC line with tip in the cavoatrial junction. Stable cardiomediastinal contours. The lung volumes are low and there is atelectasis noted within both lung bases. IMPRESSION: 1. Satisfactory position of left arm PICC line. 2. Low lung volumes with bibasilar atelectasis. Electronically Signed   By: Kerby Moors M.D.   On: 01/02/2021 12:54   [place] EKG SITE RITE  Result Date: 01/01/2021 If Site Rite image not attached, placement could not be confirmed due to current cardiac rhythm.      Assessment / Plan:    #21 76 year old white male with acute right MCA stroke, scattered infarcts in the right MCA distribution on MRI #2 acute upper GI bleed secondary esophageal varices-status post banding x4 on 01/31/2020 No active bleeding since, hemoglobin has drifted and received 1 unit of packed RBCs today Off octreotide, continues  on twice daily PPI  #3 anemia multifactorial #4 acute metabolic  encephalopathy-multifactorial continues on lactulose enemas #5 acute COVID-19 infection with respiratory failure/sepsis-continues to require pressors  #6 acute kidney injury in setting of solitary kidney status post nephrectomy for renal cell CA Creatinine stable, not improving #7 elevated LFTs improving-likely reactive secondary to sepsis/COVID-19 #8 patient does have changes of cirrhosis by noncontrasted CT explaining his portal hypertension Also with 2 indeterminant liver lesions-cannot exclude benign versus malignant Will need MRI when stable  #9  History of metastatic melanoma to the lung #10  History of renal cell CA status post nephrectomy #11  Nutrition-day 6 of admission-okay to place [place]/feeding tube tomorrow and start tube feedings  Plan:  As above Continue twice daily PPI Continue to transfuse to keep hemoglobin 7 Place feeding tube tomorrow and then okay to start tube feedings per CCM Patient will need eventual MRI of the liver to further evaluate the indeterminate liver lesions If he recovers from his acute illness will also need repeat EGD with banding in 4 to 6 weeks (Dr. Fuller Plan)    LOS: 5 days   Amy Trellis Paganini PA-C 01/03/2021, 3:03 PM     Attending Physician Note   I have taken an interval history, reviewed the chart and examined the patient. I personally saw the patient and performed more than 50% of this encounter in conjunction with the APP. I agree with the APP's note, impression and recommendations. My additional impressions and recommendations are as follows.   Lucio Edward, MD [facility] See AMION, [facility], for our on call provider

## 2021-01-04 ENCOUNTER — Inpatient Hospital Stay (HOSPITAL_COMMUNITY): Payer: No Typology Code available for payment source

## 2021-01-04 DIAGNOSIS — J9601 Acute respiratory failure with hypoxia: Secondary | ICD-10-CM | POA: Diagnosis not present

## 2021-01-04 DIAGNOSIS — I8511 Secondary esophageal varices with bleeding: Secondary | ICD-10-CM | POA: Diagnosis not present

## 2021-01-04 DIAGNOSIS — R7989 Other specified abnormal findings of blood chemistry: Secondary | ICD-10-CM | POA: Diagnosis not present

## 2021-01-04 LAB — BPAM RBC
Blood Product Expiration Date: 202301202359
ISSUE DATE / TIME: 202301010735
Unit Type and Rh: 5100

## 2021-01-04 LAB — COMPREHENSIVE METABOLIC PANEL
ALT: 121 U/L — ABNORMAL HIGH (ref 0–44)
AST: 44 U/L — ABNORMAL HIGH (ref 15–41)
Albumin: 1.7 g/dL — ABNORMAL LOW (ref 3.5–5.0)
Alkaline Phosphatase: 108 U/L (ref 38–126)
Anion gap: 14 (ref 5–15)
BUN: 83 mg/dL — ABNORMAL HIGH (ref 8–23)
CO2: 18 mmol/L — ABNORMAL LOW (ref 22–32)
Calcium: 7.5 mg/dL — ABNORMAL LOW (ref 8.9–10.3)
Chloride: 115 mmol/L — ABNORMAL HIGH (ref 98–111)
Creatinine, Ser: 3.4 mg/dL — ABNORMAL HIGH (ref 0.61–1.24)
GFR, Estimated: 18 mL/min — ABNORMAL LOW (ref >60)
Glucose, Bld: 393 mg/dL — ABNORMAL HIGH (ref 70–99)
Potassium: 3.6 mmol/L (ref 3.5–5.1)
Sodium: 147 mmol/L — ABNORMAL HIGH (ref 135–145)
Total Bilirubin: 2.2 mg/dL — ABNORMAL HIGH (ref 0.3–1.2)
Total Protein: 4.3 g/dL — ABNORMAL LOW (ref 6.5–8.1)

## 2021-01-04 LAB — GLUCOSE, CAPILLARY
Glucose-Capillary: 118 mg/dL — ABNORMAL HIGH (ref 70–99)
Glucose-Capillary: 152 mg/dL — ABNORMAL HIGH (ref 70–99)
Glucose-Capillary: 166 mg/dL — ABNORMAL HIGH (ref 70–99)
Glucose-Capillary: 140 mg/dL — ABNORMAL HIGH (ref 70–99)
Glucose-Capillary: 166 mg/dL — ABNORMAL HIGH (ref 70–99)

## 2021-01-04 LAB — MAGNESIUM
Magnesium: 1.8 mg/dL (ref 1.7–2.4)
Magnesium: 1.7 mg/dL (ref 1.7–2.4)

## 2021-01-04 LAB — PHOSPHORUS
Phosphorus: 3.5 mg/dL (ref 2.5–4.6)
Phosphorus: 3.5 mg/dL (ref 2.5–4.6)

## 2021-01-04 LAB — TYPE AND SCREEN
ABO/RH(D): O POS
Antibody Screen: NEGATIVE
Unit division: 0

## 2021-01-04 LAB — CBC
HCT: 25.5 % — ABNORMAL LOW (ref 39.0–52.0)
Hemoglobin: 8.2 g/dL — ABNORMAL LOW (ref 13.0–17.0)
MCH: 31.3 pg (ref 26.0–34.0)
MCHC: 32.2 g/dL (ref 30.0–36.0)
MCV: 97.3 fL (ref 80.0–100.0)
Platelets: 245 10*3/uL (ref 150–400)
RBC: 2.62 MIL/uL — ABNORMAL LOW (ref 4.22–5.81)
RDW: 18.7 % — ABNORMAL HIGH (ref 11.5–15.5)
WBC: 10.8 10*3/uL — ABNORMAL HIGH (ref 4.0–10.5)
nRBC: 0 % (ref 0.0–0.2)

## 2021-01-04 MED ORDER — PROSOURCE TF PO LIQD
45.0000 mL | Freq: Three times a day (TID) | ORAL | Status: DC
  Administered 2021-01-04 – 2021-01-06 (×6): 45 mL
  Filled 2021-01-04 (×6): qty 45

## 2021-01-04 MED ORDER — VITAL 1.5 CAL PO LIQD
1000.0000 mL | ORAL | Status: DC
  Administered 2021-01-04 (×2): 1000 mL

## 2021-01-04 MED ORDER — FREE WATER
250.0000 mL | Freq: Four times a day (QID) | Status: DC
  Administered 2021-01-04 – 2021-01-06 (×8): 250 mL

## 2021-01-04 MED ORDER — METOCLOPRAMIDE HCL 5 MG/ML IJ SOLN
5.0000 mg | Freq: Once | INTRAMUSCULAR | Status: AC
  Administered 2021-01-04: 5 mg via INTRAVENOUS
  Filled 2021-01-04: qty 2

## 2021-01-04 MED ORDER — DIATRIZOATE MEGLUMINE & SODIUM 66-10 % PO SOLN
15.0000 mL | Freq: Once | ORAL | Status: AC
  Administered 2021-01-04: 15 mL
  Filled 2021-01-04: qty 30

## 2021-01-04 NOTE — Progress Notes (Signed)
Nutrition Follow-up  DOCUMENTATION CODES:   Not applicable  INTERVENTION:   Initiate tube feeds via Cortrak: - Start Vital 1.5 @ 20 ml/hr and advance by 10 ml q 8 hours to goal rate of 50 ml/hr (1200 ml/day) - ProSource TF 45 ml TID - Free water flushes per CCM, currently 250 ml q 6 hours  Tube feeding regimen at goal provides 1920 kcal, 114 grams of protein, and 917 ml of H2O.  Total free water with current flushes: 1917 ml  Monitor magnesium, potassium, and phosphorus BID for at least 3 days, MD to replete as needed, as pt is at risk for refeeding syndrome given NPO since 12/29/20.  NUTRITION DIAGNOSIS:   Inadequate oral intake related to inability to eat as evidenced by NPO status.  Ongoing, being addressed via initiation of TF  GOAL:   Patient will meet greater than or equal to 90% of their needs  Met via TF at goal rate  MONITOR:   Vent status, Labs, Weight trends, TF tolerance  REASON FOR ASSESSMENT:   Consult Enteral/tube feeding initiation and management  ASSESSMENT:   Pt with PMH of metastatic melanoma, lung cancer, urothelial carcinoma, ongoing tobacco use, thrombocytopenia (felt to be immunotherapy related ITP), recent R shoulder surgery admitted with acute R MCA CVA and COVID positive. Pt with >500 ml coffee ground emesis in ED.  11/27 - NPO 12/28 - s/p banding of esophageal varices x 4  Discussed pt with RN. Pt cleared by GI on 1/01 for placement of enteral access (Cortrak tube) today after banding. Cortrak placed today by diagnostic radiology, tip at the pylorus. Consult received for tube feeding initiation and management. Given pt has been without nutrition since 12/27, suspect pt will experience refeeding. Will start tube feeds at trickle rate and slowly advance to goal. Discussed plan with RN. MD has ordered free water flushes of 250 ml q 6 hours for hypernatremia. Pt off pressors.  Admit weight: 77.1 kg Current weight: 86.4 kg  Pt with moderate  pitting generalized edema per nursing assessment. Suspect weight gain related to positive fluid balance.  Patient is currently intubated on ventilator support MV: 11.4 L/min Temp (24hrs), Avg:98.1 F (36.7 C), Min:97.7 F (36.5 C), Max:98.6 F (37 C)  Drips: D5 in LR: 75 ml/hr Fentanyl  Medications reviewed and include: SSI q 4 hours, lactulose, IV protonix  Labs reviewed: sodium 147, BUN 83, creatinine 3.40, elevated LFTs, hemoglobin 8.2 CBG's: 118-152 x 24 hours  UOP: 1395 ml x 24 hours Rectal tube: 400 ml x 24 hours I/O's: +9.3 L since admit  Diet Order:   Diet Order             Diet NPO time specified  Diet effective now                   EDUCATION NEEDS:   Not appropriate for education at this time  Skin:  Skin Assessment: Reviewed RN Assessment (laceration L thigh and L elbow)  Last BM:  01/04/21 rectal tube  Height:   Ht Readings from Last 1 Encounters:  12/17/20 5' 7" (1.702 m)    Weight:   Wt Readings from Last 1 Encounters:  01/01/21 86.4 kg    BMI:  Body mass index is 29.83 kg/m.  Estimated Nutritional Needs:   Kcal:  1900-2100  Protein:  110-125 grams  Fluid:  >1.9 L/day    Gustavus Bryant, MS, RD, LDN Inpatient Clinical Dietitian Please see AMiON for contact information.

## 2021-01-04 NOTE — Progress Notes (Signed)
Discussed PICC/Foley with critical care team. Order to keep PICC for indication of poor venous access and long term need.  Trial foley removal today.

## 2021-01-04 NOTE — Progress Notes (Addendum)
Patient ID: Dalton Miller, male   DOB: November 11, 1945, 76 y.o.   MRN: [id number]    Progress Note   Subjective  Day # 7 CC: acute right MCA CVA, acute upper GI bleed secondary to esophageal varices, sepsis, COVID-19 infection  Continues on IV PPI twice daily, off octreotide Still requiring pressors EGD 12/28-banding of varices x4  WBC 10.8/hemoglobin 8.2/hematocrit 25.5/platelets 245-stable post 1 unit yesterday  Still has melenic appearing liquid stool in Flexi-Seal    Objective   Vital signs in last 24 hours: Temp:  [97.7 F (36.5 C)-98.6 F (37 C)] 97.7 F (36.5 C) (01/02 0900) Pulse Rate:  [43-85] 67 (01/02 1000) Resp:  [11-55] 22 (01/02 1000) BP: (95-143)/(51-85) 104/64 (01/02 1000) SpO2:  [96 %-100 %] 99 % (01/02 1000) FiO2 (%):  [40 %] 40 % (01/02 0840) Last BM Date: 01/03/21 General:    Elderly white male intubated, obtunded Heart:  Regular rate and rhythm; no murmurs Lungs: Respirations even and unlabored, lungs CTA bilaterally Abdomen:  Soft, nontender and nondistended. Normal bowel sounds. Extremities:  Without edema. Neurologic: Intubated, minimal sedation, obtunded-has responded to some commands   Intake/Output from previous day: 01/01 0701 - 01/02 0700 In: 2982 [I.V.:1967; Blood:315; IV Piggyback:100] Out: 9562 [Urine:1395; Stool:400] Intake/Output this shift: Total I/O In: -  Out: 200 [Urine:200]  Lab Results: Recent Labs    01/02/21 0407 01/03/21 0351 01/04/21 0418  WBC 10.4 11.2* 10.8*  HGB 7.0* 6.8* 8.2*  HCT 21.3* 21.6* 25.5*  PLT 231 256 245   BMET Recent Labs    01/02/21 0407 01/03/21 0351 01/04/21 0418  NA 142 144 147*  K 4.0 3.8 3.6  CL 109 115* 115*  CO2 19* 19* 18*  GLUCOSE 224* 396* 393*  BUN 112* 96* 83*  CREATININE 3.96* 3.87* 3.40*  CALCIUM 6.9* 7.3* 7.5*   LFT Recent Labs    01/04/21 0418  PROT 4.3*  ALBUMIN 1.7*  AST 44*  ALT 121*  ALKPHOS 108  BILITOT 2.2*   PT/INR No results for input(s): LABPROT, INR in  the last 72 hours.  Studies/Results: CT ABDOMEN PELVIS WO CONTRAST  Result Date: 01/03/2021 CLINICAL DATA:  Portal hypertension suspected.  Suspect cirrhosis. EXAM: CT ABDOMEN AND PELVIS WITHOUT CONTRAST TECHNIQUE: Multidetector CT imaging of the abdomen and pelvis was performed following the standard protocol without IV contrast. COMPARISON:  04/04/2019 FINDINGS: Lower chest: Small bilateral pleural effusions. Airspace consolidation is identified within both lower lobes, right greater than left. Hepatobiliary: 9 mm low-attenuation structure within the lateral dome of liver is identified, image 11/3. This is a new finding when compared with the previous exam. Tiny low-density structure within the posterolateral dome of liver measuring less than 1 cm is also noted and appears new compared with the previous exam, image 10/3. Inferior and lateral margin of the right hepatic lobe a Spears slightly irregular which may reflect changes of early cirrhosis. Mild edema of the gallbladder wall is identified, image 25/3. Nonspecific in the setting of ascites. No calcified gallstones noted. High attenuation material within the gallbladder may reflect gallbladder sludge. Common bile duct is upper limits of normal measuring 7 mm. Pancreas: Unremarkable. No pancreatic ductal dilatation or surrounding inflammatory changes. Spleen: The spleen measures 13 by 10.4 x 4.5 cm (volume = 320 cm^3). Adrenals/Urinary Tract: Normal right adrenal gland. Unchanged appearance of low-attenuation enlargement of the left adrenal gland, image 25/3. This is similar to 07/02/2015 and is compatible with underlying benign adenomas. Status post left nephrectomy. Right kidney cysts are  again noted. The largest arises off the posterior cortex of the inferior pole measuring 2.6 cm. No right-sided hydronephrosis or nephrolithiasis. Urinary bladder is decompressed around a Foley catheter. Stomach/Bowel: Stomach is nondistended. The wall of the gastric  antrum appears thickened measuring 1.8 cm, image 22/3. The appendix is not confidently identified. There are scattered areas of bowel wall thickening including the descending colon, proximal sigmoid colon and rectum. Findings are nonspecific in the setting of ascites and suspected portal venous hypertension. Differential considerations include portal hypertensive gastroenterocolopathy as well as inflammatory/infectious colitis. Vascular/Lymphatic: Aortic atherosclerosis. There is no abdominopelvic adenopathy Reproductive: Prostate is unremarkable. Other: Moderate volume of ascites is identified within the abdomen and pelvis. No discrete fluid collections identified. Musculoskeletal: Mild spondylosis within the thoracolumbar spine. Diffuse body wall edema is noted compatible with anasarca. IMPRESSION: 1. Morphologic features of the liver suggestive of early cirrhosis. There is a moderate volume of ascites within the abdomen and pelvis. 2. There are 2 new low-attenuation structures within the dome of liver. These are new when compared with CT from 04/04/2019. Cannot exclude benign or malignant liver lesions. When the patient is clinically stable and able to follow directions and hold their breath (preferably as an outpatient) further evaluation with dedicated multiphase contrast enhanced abdominal MRI should be considered. 3. Wall thickening of the gastric antrum is identified. Additionally, there are several areas of nonspecific bowel wall edema involving the left colon, sigmoid colon and rectum. Nonspecific in the setting of portal venous hypertension. Findings may reflect inflammatory/infectious colitis versus portal hypertensive gastroenterocolopathy. Clinical correlation advised. 4. Bilateral lower lobe airspace consolidation is noted, right greater than left. Suspicious for pneumonia and or aspiration. 5. Small bilateral pleural effusions. 6. Aortic Atherosclerosis (ICD10-I70.0). Electronically Signed   By:  Kerby Moors M.D.   On: 01/03/2021 09:10       Assessment / Plan:    #1 76 year old status post acute right MCA CVA #2 acute metabolic encephalopathy multifactorial continues on lactulose enemas #3 sepsis, acute COVID-19 infection with respiratory failure-continued requirement for pressors  #4 acute upper GI bleed secondary to esophageal varices-new diagnosis of cirrhosis Status post EGD with banding x 4 12/30/2020  He has not had active bleeding, did require 1 unit transfusion yesterday and still has melenic appearing liquid stool via the Flexi-Seal. Question persistent oozing or clearing old blood.   #5 acute kidney injury in setting of solitary kidney status post nephrectomy for renal cell CA-creatinine still above 3  #6 elevated LFTs-continued improvement and felt reactive secondary to shock liver/sepsis/COVID-19 #7 Two indeterminate liver lesions on noncontrasted CT cannot exclude benign versus malignant-we will need MRI when able  #8 nutrition-Cortrak to be placed today #9 history of metastatic melanoma to the lung and prior nephrectomy for renal cell CA  Plan:  Continue twice daily PPI Continue to trend hemoglobin Patient will need eventual MRI to further evaluate the indeterminate liver lesions when he is more stable and able to be transported Cortrak to be placed today to start nutritional support GI going to standby. Please call if needed.    LOS: 6 days   Amy Esterwood PA-C 01/04/2021, 11:33 AM     Attending Physician Note   I have taken an interval history, reviewed the chart and examined the patient. I personally saw the patient and performed more than 50% of this encounter in conjunction with the APP. I agree with the APP's note, impression and recommendations.   Lucio Edward, MD [facility] See AMION, [facility], for our on  call provider

## 2021-01-04 NOTE — Progress Notes (Addendum)
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 Found 12/27 around 0600, unresponsive beside toilet. It is not known how long the patient was downed. He was reportedly pale and yellow appearing. Taken to [facility] where he was noted to be hypoxic SpO2 70s, hypotensive 74/50. Family states that 12/26 pt complained of upper GI pain and had a black stool.   Pt was obtunded in ED and required intubation. CT H obtained and revealed acute R MCA CVA. OGT place and promptly yielded >500 ml coffee ground emesis. His hgb is 7.1 in the ED -- received 1 PRBC. His COVID test has also resulted positive.   PCCM consulted for admission in this setting   Pertinent  Medical History  Metastatic melanoma , left lung  Recurrent urothelial cancer DM2 HTN HLD Emphysema  S/p nephrectomy   Significant Hospital Events: Including procedures, antibiotic start and stop dates in addition to other pertinent events   12/27 [facility] for AMS. Obtunded, intubated. R MCA stroke, GIB, AKI, Acute liver injury. Transferring to [facility]  COVID PCR is positive 12/28 EGD with findings of esophageal varices which were banded 12/31 has intermittent hypotension requiring Levophed.  PICC line placed on 12/30  1/1 Off pressors, 1 unit PRBCs for drop in hemoglobin  Interim History / Subjective:   Off pressors, getting PRBCs for drop in hemoglobin  Objective   Blood pressure (!) 143/65, pulse 76, temperature 97.7 F (36.5 C), temperature source Bladder, resp. rate  (!) 22, weight 86.4 kg, SpO2 100 %. CVP:  [8 mmHg-14 mmHg] 8 mmHg  Vent Mode: PRVC FiO2 (%):  [40 %] 40 % Set Rate:  [22 bmp] 22 bmp Vt Set:  [530 mL] 530 mL PEEP:  [5 cmH20] 5 cmH20 Plateau Pressure:  [17 cmH20-20 cmH20] 19 cmH20   Intake/Output Summary (Last 24 hours) at 01/04/2021 1005 Last data filed at 01/04/2021 4098 Gross per 24 hour  Intake 2106.94 ml  Output 1895 ml  Net 211.94 ml   Filed Weights   12/30/20 0425 12/31/20 0500 01/01/21 0500  Weight: 77.1 kg 77 kg 86.4 kg    Examination: Gen:      No acute distress HEENT:  EOMI, sclera anicteric Neck:     No masses; no thyromegaly, ETT Lungs:    Clear to auscultation bilaterally; normal respiratory effort CV:         Regular rate and rhythm; no murmurs Abd:      + bowel sounds; soft, non-tender; no palpable masses, no distension Ext:    No edema; adequate peripheral perfusion Skin:      Warm and dry; no rash Neuro: Sedated  Lab/imaging reviewed Significant for creatinine 3.4, sodium 147 Transaminases improving Hemoglobin 8.2 No new imaging  Resolved Hospital Problem list   AGMA, severe Lactic Acidosis Acute thrombocytosis (? Component of Hemoconcentration)   Assessment & Plan:  Acute R MCA territory infarct  -involving R basal ganglia, R frontal lobe  -notably, is typically thrombocytopenic but on admission plt are >500  P Appreciate neurology  input.   MRI reviewed with MCA infarct Echo with normal EF, diastolic dysfunction  Acute metabolic encephalopathy: multifactorial  -hyperammonemia (115) , acute liver injury, AKI, sepsis, stroke, hypoxia  P Continue rectal lactulose  Acute respiratory failure with hypoxia -POA COVID-19 infection -POA P Suspect COVID-19 infection is incidental as chest x-ray appears clear. Cannot get remdesivir and molnupiravir due to liver failure. Paxlovid cannot be crushed in feeding tube Supportive care Steroids if he deteriorates  Suspected severe sepsis -POA. Source  unclear.  -WBC 40.7. Possible component of hemoconcentration contributing but picture is c/w severe sepsis  P DC Vanco as MRSA is negative Narrowed abx to rocephin to cover possible CAP. Day 7/7  AKI Solitary kidney P Follow urine output and creatinine  Acute liver injury - POA Hyperammonemia -POA GIB, acute blood loss anemia EGD showed varices s/p banding -acutely elevated LFTs. P Unclear why he has cirrhosis as per family he does not drink and hepatitis panel is negative Upper quadrant ultrasound noted with no acute abnormality CT abd pelvis reviewed IV PPI. 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  P Hold outpt promacta (25mg  qD)   Metastatic recurrent melanoma - POA -L upper lung lesion, mediastinal adenopathy  -s/p 4 cycles ipi/nivo, maintenance nivo dc due to ITP Recurrent low grade urothelial carcinoma s/p TURBT, repeat TURBT x2 - POA P Supportive care  Hyponatremia, moderate malnutrition Will place Cortrak as it is cleared by GI Start tube feeds and free water  Daughter updated over telephone 1/2  Best Practice (right click and "Reselect all SmartList Selections" daily)   Diet/type: NPO DVT prophylaxis: SCD GI prophylaxis: PPI Lines: Central line Foley:  Yes, and it is no longer needed and removal ordered  Code Status:  full code Last date of multidisciplinary goals of care discussion [12/28] Family updated on 12/31. Remains full code  Critical care time:    The patient is critically ill with multiple organ system failure and requires high complexity decision making for assessment and support, frequent evaluation and titration of therapies, advanced monitoring, review of radiographic studies and interpretation of complex data.   Critical Care Time devoted to patient care services, exclusive of separately billable procedures, described in this note is 35 minutes.    Marshell Garfinkel MD [facility] See Amion for pager  If no response to pager , please call [phone] until 7pm After 7:00 pm call Elink  [phone] 01/04/2021, 10:05 AM

## 2021-01-04 NOTE — Progress Notes (Signed)
Patient transported to IR and back without complications. RN at bedside.

## 2021-01-04 NOTE — Progress Notes (Signed)
Cortrak Tube Team Note:  Consult received to place a Cortrak feeding tube.   RD unable to advance cortrak tube past the GE junction after multiple attempts. Recommend fluoroscopy guided nasogastric tube placement.   Koleen Distance MS, RD, LDN Please refer to [facility] for RD and/or RD on-call/weekend/after hours pager

## 2021-01-04 NOTE — Progress Notes (Signed)
OK by Dr Wonda Amis to use home Refresh PM lubricant eye ointment from home.

## 2021-01-05 ENCOUNTER — Ambulatory Visit (HOSPITAL_COMMUNITY): Payer: No Typology Code available for payment source

## 2021-01-05 DIAGNOSIS — J9601 Acute respiratory failure with hypoxia: Secondary | ICD-10-CM | POA: Diagnosis not present

## 2021-01-05 LAB — PHOSPHORUS
Phosphorus: 3.7 mg/dL (ref 2.5–4.6)
Phosphorus: 4 mg/dL (ref 2.5–4.6)

## 2021-01-05 LAB — BASIC METABOLIC PANEL WITH GFR
Anion gap: 11 (ref 5–15)
BUN: 67 mg/dL — ABNORMAL HIGH (ref 8–23)
CO2: 18 mmol/L — ABNORMAL LOW (ref 22–32)
Calcium: 7.4 mg/dL — ABNORMAL LOW (ref 8.9–10.3)
Chloride: 119 mmol/L — ABNORMAL HIGH (ref 98–111)
Creatinine, Ser: 2.85 mg/dL — ABNORMAL HIGH (ref 0.61–1.24)
GFR, Estimated: 22 mL/min — ABNORMAL LOW
Glucose, Bld: 516 mg/dL (ref 70–99)
Potassium: 3.4 mmol/L — ABNORMAL LOW (ref 3.5–5.1)
Sodium: 148 mmol/L — ABNORMAL HIGH (ref 135–145)

## 2021-01-05 LAB — CBC
HCT: 25.8 % — ABNORMAL LOW (ref 39.0–52.0)
Hemoglobin: 8 g/dL — ABNORMAL LOW (ref 13.0–17.0)
MCH: 31.7 pg (ref 26.0–34.0)
MCHC: 31 g/dL (ref 30.0–36.0)
MCV: 102.4 fL — ABNORMAL HIGH (ref 80.0–100.0)
Platelets: 229 10*3/uL (ref 150–400)
RBC: 2.52 MIL/uL — ABNORMAL LOW (ref 4.22–5.81)
RDW: 19.4 % — ABNORMAL HIGH (ref 11.5–15.5)
WBC: 10.1 10*3/uL (ref 4.0–10.5)
nRBC: 0 % (ref 0.0–0.2)

## 2021-01-05 LAB — BASIC METABOLIC PANEL
Anion gap: 8 (ref 5–15)
BUN: 73 mg/dL — ABNORMAL HIGH (ref 8–23)
CO2: 20 mmol/L — ABNORMAL LOW (ref 22–32)
Calcium: 7.3 mg/dL — ABNORMAL LOW (ref 8.9–10.3)
Chloride: 121 mmol/L — ABNORMAL HIGH (ref 98–111)
Creatinine, Ser: 3.03 mg/dL — ABNORMAL HIGH (ref 0.61–1.24)
GFR, Estimated: 21 mL/min — ABNORMAL LOW (ref >60)
Glucose, Bld: 199 mg/dL — ABNORMAL HIGH (ref 70–99)
Potassium: 3.3 mmol/L — ABNORMAL LOW (ref 3.5–5.1)
Sodium: 149 mmol/L — ABNORMAL HIGH (ref 135–145)

## 2021-01-05 LAB — GLUCOSE, CAPILLARY
Glucose-Capillary: 157 mg/dL — ABNORMAL HIGH (ref 70–99)
Glucose-Capillary: 172 mg/dL — ABNORMAL HIGH (ref 70–99)
Glucose-Capillary: 180 mg/dL — ABNORMAL HIGH (ref 70–99)
Glucose-Capillary: 188 mg/dL — ABNORMAL HIGH (ref 70–99)
Glucose-Capillary: 198 mg/dL — ABNORMAL HIGH (ref 70–99)
Glucose-Capillary: 219 mg/dL — ABNORMAL HIGH (ref 70–99)

## 2021-01-05 LAB — MAGNESIUM
Magnesium: 1.7 mg/dL (ref 1.7–2.4)
Magnesium: 2.6 mg/dL — ABNORMAL HIGH (ref 1.7–2.4)

## 2021-01-05 MED ORDER — PANTOPRAZOLE 2 MG/ML SUSPENSION
40.0000 mg | Freq: Two times a day (BID) | ORAL | Status: DC
  Administered 2021-01-05: 40 mg
  Filled 2021-01-05: qty 20

## 2021-01-05 MED ORDER — ORAL CARE MOUTH RINSE
15.0000 mL | Freq: Two times a day (BID) | OROMUCOSAL | Status: DC
  Administered 2021-01-05 – 2021-01-06 (×4): 15 mL via OROMUCOSAL

## 2021-01-05 MED ORDER — MAGNESIUM SULFATE 2 GM/50ML IV SOLN
2.0000 g | Freq: Once | INTRAVENOUS | Status: AC
  Administered 2021-01-05: 2 g via INTRAVENOUS
  Filled 2021-01-05: qty 50

## 2021-01-05 MED ORDER — LACTULOSE 10 GM/15ML PO SOLN
20.0000 g | Freq: Three times a day (TID) | ORAL | Status: DC
  Administered 2021-01-05 (×2): 20 g
  Filled 2021-01-05 (×2): qty 30

## 2021-01-05 MED ORDER — POTASSIUM CHLORIDE 20 MEQ PO PACK
40.0000 meq | PACK | Freq: Once | ORAL | Status: AC
  Administered 2021-01-05: 40 meq
  Filled 2021-01-05: qty 2

## 2021-01-05 MED ORDER — ARTIFICIAL TEARS OPHTHALMIC OINT
TOPICAL_OINTMENT | Freq: Every day | OPHTHALMIC | Status: DC
  Administered 2021-01-11: 1 via OPHTHALMIC
  Filled 2021-01-05 (×2): qty 3.5

## 2021-01-05 NOTE — Progress Notes (Signed)
Patient extubated ~1220 pm on 01/05/21.  Patient drowsy but oriented x 4 and asking appropriate and relevant questions related to transfer from [facility] to [facility] and stroke.  Patient passed [employer] screen and diet order placed after confirmation with MD.  Patient ate only a few bites of dinner (2 pieces of cut baked chicken, 2 spoonfuls of rice and 1 spoonful of corn.)  Patient able to drink approximately 450cc of fluids PO.  TF still running.  Medications still given via cortrak at current time.

## 2021-01-05 NOTE — Procedures (Signed)
Extubation Procedure Note  Patient Details:   Name: Dalton Miller DOB: [birth year] MRN: [id number]   Airway Documentation:    Vent end date: 01/05/21 Vent end time: 1216   Evaluation  O2 sats: stable throughout and currently acceptable Complications: No apparent complications Patient did tolerate procedure well. Bilateral Breath Sounds: Fine crackles   Yes  Patient was extubated to a 4L [place]. Cuff leak was heard. No stridor was noted. RN was at the bedside with RT during extubation. Patient tolerated well.  Tiburcio Bash 01/05/2021, 12:23 PM

## 2021-01-05 NOTE — Progress Notes (Signed)
NAME:  Dalton Miller, MRN:  [id number], DOB:  Apr 15, 1945, LOS: 7 ADMISSION DATE:  12/29/2020, CONSULTATION DATE:  12/29/20 REFERRING MD:  Eulis Foster - [facility], CHIEF COMPLAINT:  CVA and GIB    History of Present Illness:   76 yo with complex PMH including metastatic melanoma, lung cancer, urothelial carcinoma, ongoing tobacco use disorder,  thrombocytopenia (felt to be immunotherapy related ITP), recent R shoulder surgery for impingement + OA (12/17/20), presented to [facility] 12/27 with AMS after possible fall  Pt LKW 12/26 around 2100. Found 12/27 around 0600, unresponsive beside toilet. It is not known how long the patient was downed. He was reportedly pale and yellow appearing. Taken to [facility] where he was noted to be hypoxic SpO2 70s, hypotensive 74/50. Family states that 12/26 pt complained of upper GI pain and had a black stool.   Pt was obtunded in ED and required intubation. CT H obtained and revealed acute R MCA CVA. OGT place and promptly yielded >500 ml coffee ground emesis. His hgb is 7.1 in the ED -- received 1 PRBC. His COVID test has also resulted positive.   PCCM consulted for admission in this setting   Pertinent  Medical History  Metastatic melanoma , left lung  Recurrent urothelial cancer DM2 HTN HLD Emphysema  S/p nephrectomy   Significant Hospital Events: Including procedures, antibiotic start and stop dates in addition to other pertinent events   12/27 [facility] for AMS. Obtunded, intubated. R MCA stroke, GIB, AKI, Acute liver injury. Transferring to [facility].  COVID PCR is positive 12/28 EGD with findings of esophageal varices which were banded 12/31 has intermittent hypotension requiring Levophed.  PICC line placed on 12/30  1/1 Off pressors, 1 unit PRBCs for drop in hemoglobin  Interim History / Subjective:   No acute events overnight. Tolerating SBT this AM.   Objective   Blood pressure 118/67, pulse 79, temperature 98.3 F (36.8 C), temperature source Axillary, resp. rate (!)  27, weight 86.6 kg, SpO2 100 %. CVP:  [6 mmHg-10 mmHg] 6 mmHg  Vent Mode: PSV;CPAP FiO2 (%):  [40 %] 40 % Set Rate:  [22 bmp] 22 bmp Vt Set:  [530 mL] 530 mL PEEP:  [5 cmH20] 5 cmH20 Pressure Support:  [8 cmH20] 8 cmH20 Plateau Pressure:  [14 cmH20-19 cmH20] 18 cmH20   Intake/Output Summary (Last 24 hours) at 01/05/2021 1010 Last data filed at 01/05/2021 0700 Gross per 24 hour  Intake 3884.43 ml  Output 2355 ml  Net 1529.43 ml   Filed Weights   12/31/20 0500 01/01/21 0500 01/05/21 0500  Weight: 77 kg 86.4 kg 86.6 kg    Examination: Gen:      No acute distress, intubated, sedated HEENT:  EOMI, sclera anicteric Neck:     No masses; no thyromegaly, ETT Lungs:    Clear to auscultation bilaterally; normal respiratory effort CV:         Regular rate and rhythm; no murmurs Abd:      + bowel sounds; soft, non-tender; no palpable masses, no distension Ext:    trace edema; adequate peripheral perfusion Skin:      Warm and dry; no rash Neuro:  Sedated  Resolved Hospital Problem list   AGMA, severe Lactic Acidosis Acute thrombocytosis (? Component of Hemoconcentration)   Assessment & Plan:  Acute R MCA territory infarct  -involving R basal ganglia, R frontal lobe  -notably, is typically thrombocytopenic but on admission plt are >500  P Appreciate neurology input.   MRI reviewed with  MCA infarct Echo with normal EF, diastolic dysfunction  Acute metabolic encephalopathy: multifactorial  -hyperammonemia (115) , acute liver injury, AKI, sepsis, stroke, hypoxia  P Continue lactulose TID  Acute respiratory failure with hypoxia -POA COVID-19 infection -POA P Suspect COVID-19 infection is incidental as chest x-ray appears clear. Cannot get remdesivir and molnupiravir due to liver failure. Paxlovid cannot be crushed in feeding tube Supportive care Steroids if he deteriorates Plan to extubate today  Suspected severe sepsis -POA. Source unclear.  -WBC 40.7. Possible component of  hemoconcentration contributing but picture is c/w severe sepsis  P DC Vanco as MRSA is negative Narrowed abx to rocephin to cover possible CAP. Day 7/7  AKI Solitary kidney P Follow urine output and creatinine  Acute liver injury - POA Hyperammonemia -POA GIB, acute blood loss anemia EGD showed varices s/p banding -acutely elevated LFTs. P Unclear why he has cirrhosis as per family he does not drink and hepatitis panel is negative Upper quadrant ultrasound noted with no acute abnormality CT abd pelvis reviewed IV PPI. 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  P Hold outpt promacta (25mg  qD)   Metastatic recurrent melanoma - POA -L upper lung lesion, mediastinal adenopathy  -s/p 4 cycles ipi/nivo, maintenance nivo dc due to ITP Recurrent low grade urothelial carcinoma s/p TURBT, repeat TURBT x2 - POA P Supportive care  Hyponatremia, moderate malnutrition Will place Cortrak as it is cleared by GI Start tube feeds and free water  Daughter updated over telephone 1/2  Best Practice (right click and "Reselect all SmartList Selections" daily)   Diet/type: NPO DVT prophylaxis: SCD GI prophylaxis: PPI Lines: Central line Foley:  Yes, and it is no longer needed and removal ordered  Code Status:  full code Last date of multidisciplinary goals of care discussion [12/28] Family updated on 12/31. Remains full code  Critical care time:    The patient is critically ill with multiple organ system failure and requires high complexity decision making for assessment and support, frequent evaluation and titration of therapies, advanced monitoring, review of radiographic studies and interpretation of complex data.   Critical Care Time devoted to patient care services, exclusive of separately billable procedures, described in this note is 45 minutes.   Freda Jackson, MD [facility] Office: [phone]   See Amion for personal pager PCCM on call pager [phone] until 7pm. Please call Elink 7p-7a. [phone]

## 2021-01-06 DIAGNOSIS — J9601 Acute respiratory failure with hypoxia: Secondary | ICD-10-CM | POA: Diagnosis not present

## 2021-01-06 LAB — BASIC METABOLIC PANEL
Anion gap: 6 (ref 5–15)
BUN: 60 mg/dL — ABNORMAL HIGH (ref 8–23)
CO2: 20 mmol/L — ABNORMAL LOW (ref 22–32)
Calcium: 7.7 mg/dL — ABNORMAL LOW (ref 8.9–10.3)
Chloride: 120 mmol/L — ABNORMAL HIGH (ref 98–111)
Creatinine, Ser: 2.47 mg/dL — ABNORMAL HIGH (ref 0.61–1.24)
GFR, Estimated: 27 mL/min — ABNORMAL LOW (ref >60)
Glucose, Bld: 180 mg/dL — ABNORMAL HIGH (ref 70–99)
Potassium: 3.7 mmol/L (ref 3.5–5.1)
Sodium: 146 mmol/L — ABNORMAL HIGH (ref 135–145)

## 2021-01-06 LAB — CREATININE, SERUM
Creatinine, Ser: 2.55 mg/dL — ABNORMAL HIGH (ref 0.61–1.24)
GFR, Estimated: 26 mL/min — ABNORMAL LOW (ref >60)

## 2021-01-06 LAB — MAGNESIUM: Magnesium: 2.1 mg/dL (ref 1.7–2.4)

## 2021-01-06 LAB — GLUCOSE, CAPILLARY
Glucose-Capillary: 127 mg/dL — ABNORMAL HIGH (ref 70–99)
Glucose-Capillary: 153 mg/dL — ABNORMAL HIGH (ref 70–99)
Glucose-Capillary: 174 mg/dL — ABNORMAL HIGH (ref 70–99)
Glucose-Capillary: 174 mg/dL — ABNORMAL HIGH (ref 70–99)
Glucose-Capillary: 194 mg/dL — ABNORMAL HIGH (ref 70–99)
Glucose-Capillary: 195 mg/dL — ABNORMAL HIGH (ref 70–99)
Glucose-Capillary: 212 mg/dL — ABNORMAL HIGH (ref 70–99)

## 2021-01-06 LAB — PHOSPHORUS: Phosphorus: 4 mg/dL (ref 2.5–4.6)

## 2021-01-06 MED ORDER — LACTULOSE 10 GM/15ML PO SOLN
20.0000 g | Freq: Three times a day (TID) | ORAL | Status: DC
  Administered 2021-01-06 – 2021-01-08 (×7): 20 g via ORAL
  Filled 2021-01-06 (×7): qty 30

## 2021-01-06 MED ORDER — ORAL CARE MOUTH RINSE
15.0000 mL | Freq: Two times a day (BID) | OROMUCOSAL | Status: DC
  Administered 2021-01-07 – 2021-01-09 (×6): 15 mL via OROMUCOSAL

## 2021-01-06 MED ORDER — ENSURE MAX PROTEIN PO LIQD
11.0000 [oz_av] | Freq: Every day | ORAL | Status: DC
  Administered 2021-01-07 – 2021-01-12 (×4): 11 [oz_av] via ORAL
  Filled 2021-01-06 (×7): qty 330

## 2021-01-06 MED ORDER — PANTOPRAZOLE SODIUM 40 MG PO TBEC
40.0000 mg | DELAYED_RELEASE_TABLET | Freq: Two times a day (BID) | ORAL | Status: DC
  Administered 2021-01-06 – 2021-01-12 (×13): 40 mg via ORAL
  Filled 2021-01-06 (×13): qty 1

## 2021-01-06 MED ORDER — GLUCERNA SHAKE PO LIQD
237.0000 mL | Freq: Three times a day (TID) | ORAL | Status: DC
  Administered 2021-01-06 – 2021-01-12 (×11): 237 mL via ORAL

## 2021-01-06 MED ORDER — INFLUENZA VAC A&B SA ADJ QUAD 0.5 ML IM PRSY
0.5000 mL | PREFILLED_SYRINGE | INTRAMUSCULAR | Status: DC
  Filled 2021-01-06: qty 0.5

## 2021-01-06 MED ORDER — ADULT MULTIVITAMIN W/MINERALS CH
1.0000 | ORAL_TABLET | Freq: Every day | ORAL | Status: DC
  Administered 2021-01-06 – 2021-01-12 (×7): 1 via ORAL
  Filled 2021-01-06 (×7): qty 1

## 2021-01-06 MED ORDER — TAMSULOSIN HCL 0.4 MG PO CAPS
0.4000 mg | ORAL_CAPSULE | Freq: Every day | ORAL | Status: AC
  Administered 2021-01-06 – 2021-01-08 (×3): 0.4 mg via ORAL
  Filled 2021-01-06 (×3): qty 1

## 2021-01-06 NOTE — Progress Notes (Signed)
NAME:  HAMAD WHYTE, MRN:  [id number], DOB:  05-14-1945, LOS: 8 ADMISSION DATE:  12/29/2020, CONSULTATION DATE:  12/29/20 REFERRING MD:  Eulis Foster - [facility], CHIEF COMPLAINT:  CVA and GIB    History of Present Illness:   76 yo with complex PMH including metastatic melanoma, lung cancer, urothelial carcinoma, ongoing tobacco use disorder,  thrombocytopenia (felt to be immunotherapy related ITP), recent R shoulder surgery for impingement + OA (12/17/20), presented to [facility] 12/27 with AMS after possible fall  Pt LKW 12/26 around 2100. Found 12/27 around 0600, unresponsive beside toilet. It is not known how long the patient was downed. He was reportedly pale and yellow appearing. Taken to [facility] where he was noted to be hypoxic SpO2 70s, hypotensive 74/50. Family states that 12/26 pt complained of upper GI pain and had a black stool.   Pt was obtunded in ED and required intubation. CT H obtained and revealed acute R MCA CVA. OGT place and promptly yielded >500 ml coffee ground emesis. His hgb is 7.1 in the ED -- received 1 PRBC. His COVID test has also resulted positive.   PCCM consulted for admission in this setting   Pertinent  Medical History  Metastatic melanoma , left lung  Recurrent urothelial cancer DM2 HTN HLD Emphysema  S/p nephrectomy   Significant Hospital Events: Including procedures, antibiotic start and stop dates in addition to other pertinent events   12/27 [facility] for AMS. Obtunded, intubated. R MCA stroke, GIB, AKI, Acute liver injury. Transferring to [facility].  COVID PCR is positive 12/28 EGD with findings of esophageal varices which were banded 12/31 has intermittent hypotension requiring Levophed.  PICC line placed on 12/30  1/1 Off pressors, 1 unit PRBCs for drop in hemoglobin  Interim History / Subjective:   No acute events overnight. Extubated yesterday and doing well.  Objective   Blood pressure 132/67, pulse 65, temperature 98.6 F (37 C), temperature source Axillary, resp.  rate (!) 24, weight 86.6 kg, SpO2 96 %.    Vent Mode: PSV;CPAP FiO2 (%):  [40 %] 40 % PEEP:  [5 cmH20] 5 cmH20 Pressure Support:  [5 cmH20-8 cmH20] 5 cmH20   Intake/Output Summary (Last 24 hours) at 01/06/2021 0744 Last data filed at 01/06/2021 0500 Gross per 24 hour  Intake 2982.11 ml  Output 2550 ml  Net 432.11 ml   Filed Weights   12/31/20 0500 01/01/21 0500 01/05/21 0500  Weight: 77 kg 86.4 kg 86.6 kg    Examination: Gen:      No acute distress HEENT:  EOMI, sclera anicteric, NG in place Neck:     No masses; no thyromegaly, ETT Lungs:    Clear to auscultation bilaterally; normal respiratory effort CV:         Regular rate and rhythm; no murmurs Abd:      + bowel sounds; soft, non-tender; no palpable masses, no distension Ext:    trace edema; adequate peripheral perfusion Skin:      Warm and dry; no rash Neuro:  Sedated  Resolved Hospital Problem list   AGMA, severe Lactic Acidosis Acute thrombocytosis (? Component of Hemoconcentration)   Assessment & Plan:  Acute R MCA territory infarct  -involving R basal ganglia, R frontal lobe  -notably, is typically thrombocytopenic but on admission plt are >500  P Appreciate neurology input. Signed off 12/30 MRI reviewed with MCA infarct Echo with normal EF, diastolic dysfunction Consider antiplatelet once cleared by GI  Acute metabolic encephalopathy: multifactorial  -hyperammonemia (115) , acute  liver injury, AKI, sepsis, stroke, hypoxia  P Continue lactulose TID  Acute respiratory failure with hypoxia -POA COVID-19 infection -POA P Suspect COVID-19 infection is incidental as chest x-ray appears clear. Cannot get remdesivir and molnupiravir due to liver failure. Paxlovid cannot be crushed in feeding tube Supportive care Steroids if he deteriorates Extubated 1/3  Suspected severe sepsis -POA. Source unclear.  -WBC 40.7. Possible component of hemoconcentration contributing but picture is c/w severe sepsis   P Completed 7 day course of ceftriaxone, completed 1/2  AKI Solitary kidney P Follow urine output and creatinine  Acute liver injury - POA Hyperammonemia -POA GIB, acute blood loss anemia EGD showed varices s/p banding -acutely elevated LFTs. P Unclear why he has cirrhosis as per family he does not drink and hepatitis panel is negative Upper quadrant ultrasound noted with no acute abnormality CT abd pelvis reviewed IV PPI. 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  P Hold outpt promacta (25mg  qD)   Metastatic recurrent melanoma - POA -L upper lung lesion, mediastinal adenopathy  -s/p 4 cycles ipi/nivo, maintenance nivo dc due to ITP Recurrent low grade urothelial carcinoma s/p TURBT, repeat TURBT x2 - POA P Supportive care  Hypernatremia, moderate malnutrition Continue free water flushes until oral input increases Continue protein supplementation until oral input increases  Best Practice (right click and "Reselect all SmartList Selections" daily)   Diet/type: Regular consistency (see orders) DVT prophylaxis: SCD GI prophylaxis: PPI Lines: N/A Foley:  Yes, and it is no longer needed and removal ordered  Code Status:  full code Last date of multidisciplinary goals of care discussion [12/28] Family updated on 12/31. Remains full code  Critical care time: n/a    Freda Jackson, MD [facility] Office: [phone]   See Amion for personal pager PCCM on call pager [phone] until 7pm. Please call Elink 7p-7a. [phone]

## 2021-01-06 NOTE — Progress Notes (Signed)
Nutrition Follow-up  DOCUMENTATION CODES:  Not applicable  INTERVENTION:  Continue diet as ordered.  Add Glucerna Shake po TID, each supplement provides 220 kcal and 10 grams of protein.  Add Ensure Max po daily, each supplement provides 150 kcal and 30 grams of protein.    Add MVI with minerals daily.  Encourage PO and supplement intake.  NUTRITION DIAGNOSIS:  Inadequate oral intake related to inability to eat as evidenced by NPO status. - ongoing  GOAL:  Patient will meet greater than or equal to 90% of their needs. - progressing PO  MONITOR:  PO intake, Supplement acceptance, Labs, Weight trends, Skin, I & O's  REASON FOR ASSESSMENT:  Consult Enteral/tube feeding initiation and management  ASSESSMENT:  Pt with PMH of metastatic melanoma, lung cancer, urothelial carcinoma, ongoing tobacco use, thrombocytopenia (felt to be immunotherapy related ITP), recent R shoulder surgery admitted with acute R MCA CVA and COVID positive. Pt with >500 ml coffee ground emesis in ED. 12/27 - transfer to [facility] from [facility], intubated, tested positive for COVID 12/28 - EGD (esophageal varices that were banded) 12/30 - PICC line placed 01/01 - off pressors 01/02 - Cortrak placed by fluoro (tip gastric) 01/03 - extubated, diet advanced to Carb Mod  Unclear why he has cirrhosis as per family he does not drink and hepatitis panel is negative.  Per Epic, pt ate 20% of breakfast this morning. RN endorsed this.  Spoke with RN. Plan to remove Cortrak this afternoon. She reports that he is a bit weak and his grandson is assisting with feeding in the room with meals.  She feels confident that he will be able to feed himself once he regains strength.  Of note, pt with moderate generalized, mild BLE, and mild BUE edema.  RD to add Glucerna shakes TID, as well as Ensure Max daily and MVI with minerals.  Medications: reviewed; FWF of 250 ml QID, SSI, lactulose TID, Protonix BID  Labs: reviewed; Na 146  (H), CBG 153-195 (H)  Diet Order:   Diet Order             Diet Carb Modified Fluid consistency: Thin; Room service appropriate? Yes with Assist  Diet effective now                  EDUCATION NEEDS:  Not appropriate for education at this time  Skin:  Skin Assessment: Reviewed RN Assessment (laceration L thigh and L elbow)  Last BM:  01/06/21 - rectal tube  Height:  Ht Readings from Last 1 Encounters:  12/17/20 5\' 7"  (1.702 m)   Weight:  Wt Readings from Last 1 Encounters:  01/05/21 86.6 kg   BMI:  Body mass index is 29.9 kg/m.  Estimated Nutritional Needs:  Kcal:  1900-2100 Protein:  110-125 grams Fluid:  >1.9 L/day  Derrel Nip, RD, LDN (she/her/hers) Clinical Inpatient Dietitian RD Pager/After-Hours/Weekend Pager # in [phone]

## 2021-01-06 NOTE — Progress Notes (Signed)
Consult placed for PIV to remove triple lumen PICC. RN assessed with ultrasound however vasculature in right arm was not compressible. Recommend leaving PICC in place if there are no concerns with its function. Spoke with Roland Rack, RN and let her know of recommendations.   Chung Chagoya Lorita Officer, RN

## 2021-01-07 ENCOUNTER — Encounter (HOSPITAL_COMMUNITY): Payer: No Typology Code available for payment source | Admitting: Occupational Therapy

## 2021-01-07 ENCOUNTER — Encounter (HOSPITAL_COMMUNITY): Payer: Self-pay | Admitting: Orthopedic Surgery

## 2021-01-07 LAB — COMPREHENSIVE METABOLIC PANEL
ALT: 64 U/L — ABNORMAL HIGH (ref 0–44)
AST: 30 U/L (ref 15–41)
Albumin: 2.1 g/dL — ABNORMAL LOW (ref 3.5–5.0)
Alkaline Phosphatase: 151 U/L — ABNORMAL HIGH (ref 38–126)
Anion gap: 7 (ref 5–15)
BUN: 49 mg/dL — ABNORMAL HIGH (ref 8–23)
CO2: 20 mmol/L — ABNORMAL LOW (ref 22–32)
Calcium: 8 mg/dL — ABNORMAL LOW (ref 8.9–10.3)
Chloride: 118 mmol/L — ABNORMAL HIGH (ref 98–111)
Creatinine, Ser: 2.08 mg/dL — ABNORMAL HIGH (ref 0.61–1.24)
GFR, Estimated: 33 mL/min — ABNORMAL LOW (ref >60)
Glucose, Bld: 218 mg/dL — ABNORMAL HIGH (ref 70–99)
Potassium: 4.1 mmol/L (ref 3.5–5.1)
Sodium: 145 mmol/L (ref 135–145)
Total Bilirubin: 1.4 mg/dL — ABNORMAL HIGH (ref 0.3–1.2)
Total Protein: 5 g/dL — ABNORMAL LOW (ref 6.5–8.1)

## 2021-01-07 LAB — CBC
HCT: 31.4 % — ABNORMAL LOW (ref 39.0–52.0)
Hemoglobin: 9.9 g/dL — ABNORMAL LOW (ref 13.0–17.0)
MCH: 32.2 pg (ref 26.0–34.0)
MCHC: 31.5 g/dL (ref 30.0–36.0)
MCV: 102.3 fL — ABNORMAL HIGH (ref 80.0–100.0)
Platelets: 233 10*3/uL (ref 150–400)
RBC: 3.07 MIL/uL — ABNORMAL LOW (ref 4.22–5.81)
RDW: 19.3 % — ABNORMAL HIGH (ref 11.5–15.5)
WBC: 12.2 10*3/uL — ABNORMAL HIGH (ref 4.0–10.5)
nRBC: 0 % (ref 0.0–0.2)

## 2021-01-07 LAB — GLUCOSE, CAPILLARY
Glucose-Capillary: 120 mg/dL — ABNORMAL HIGH (ref 70–99)
Glucose-Capillary: 122 mg/dL — ABNORMAL HIGH (ref 70–99)
Glucose-Capillary: 137 mg/dL — ABNORMAL HIGH (ref 70–99)
Glucose-Capillary: 162 mg/dL — ABNORMAL HIGH (ref 70–99)
Glucose-Capillary: 171 mg/dL — ABNORMAL HIGH (ref 70–99)
Glucose-Capillary: 185 mg/dL — ABNORMAL HIGH (ref 70–99)

## 2021-01-07 MED ORDER — AMLODIPINE BESYLATE 5 MG PO TABS
5.0000 mg | ORAL_TABLET | Freq: Every day | ORAL | Status: DC
  Administered 2021-01-07 – 2021-01-08 (×2): 5 mg via ORAL
  Filled 2021-01-07 (×2): qty 1

## 2021-01-07 MED ORDER — INSULIN ASPART 100 UNIT/ML IJ SOLN
0.0000 [IU] | Freq: Three times a day (TID) | INTRAMUSCULAR | Status: DC
  Administered 2021-01-07 (×2): 3 [IU] via SUBCUTANEOUS
  Administered 2021-01-08: 2 [IU] via SUBCUTANEOUS
  Administered 2021-01-08: 5 [IU] via SUBCUTANEOUS
  Administered 2021-01-08: 2 [IU] via SUBCUTANEOUS
  Administered 2021-01-09: 3 [IU] via SUBCUTANEOUS
  Administered 2021-01-09: 2 [IU] via SUBCUTANEOUS
  Administered 2021-01-09: 3 [IU] via SUBCUTANEOUS
  Administered 2021-01-10: 2 [IU] via SUBCUTANEOUS
  Administered 2021-01-10: 3 [IU] via SUBCUTANEOUS
  Administered 2021-01-11 – 2021-01-12 (×3): 2 [IU] via SUBCUTANEOUS

## 2021-01-07 NOTE — Progress Notes (Signed)
PROGRESS NOTE  Dalton Miller  YPP:[id number] DOB: 10-Dec-1945 DOA: 12/29/2020 PCP: [facility]   Brief Narrative: Dalton Miller is a 76 y.o. male with a history of metastatic melanoma, lung CA, urothelial CA, tobacco use, thrombocytopenia, right shoulder OA and impingement s/p surgery 12/17/20 who presented to the [facility] 12/27 found down, unresponsive by the toilet at home (unknown duration, LKW previous evening when he complained of upper abd pain and black stool). He required intubation in the ED, noted to have acute right MCA CVA on CT. OGT placed with >500cc coffee ground emesis, hgb 7.1g/dl. LFTs elevated. +SARS-CoV-2 PCR also noted on admission to ICU at [facility]. EGD performed 12/28 revealed esophageal varices which were banded. He received 1u PRBCs on 1/1. Ultimately he was weaned off pressor support and extubated on 1/3, transferred to floor on hospitalist service as of 1/5.   Assessment & Plan: Principal Problem:   Acute respiratory failure with hypoxia (HCC) Active Problems:   CVA (cerebral vascular accident) ([place])   Gastrointestinal hemorrhage   Hyperammonemia (HCC)   Toxic metabolic encephalopathy   Jaundice   Abnormal LFTs   AKI (acute kidney injury) ([place])   Hypotension   COVID-19 virus infection   Leukocytosis   Thrombocytosis   Severe sepsis (HCC)   Hematemesis with nausea   Melena  Acute blood loss anemia due to upper GI bleeding due to esophageal varices: s/p banding x4 12/28, 1u PRBCs 1/1.  - Hgb recheck today and is stable.  - Continue PPI BID, completed octreotide.  - Will need repeat EGD in 4-6 weeks with banding (initial EGD by Dr. Fuller Plan). - Not initiating nonselective BB due to 1st degree heart block with rate in 60's. If improves, may consider low dose.   Hepatic cirrhosis: By morphologic appearance on CT abd/pelvis. Also with ascites and changes consistent with portal HTN. - LFTs have improved steadily, likely acutely worsened from sepsis/covid-19.  Hyperbilirubinemia still noted. - Needs GI follow up as outpatient.   Acute right MCA CVA affecting basal ganglia, right frontal lobe. Scattered cortical and subcortical acute infarcts in the right MCA distribution with mild petechial hemorrhage. MRA revealed 70% stenosis at the right M1 segment. - PT/OT - Stroke neurology signed off 12/30, recommending restarting statin (LDL 71 once LFTs improve) and antiplatelet once bleeding subsided. Will need neurology follow up in 6-8 weeks.   Acute hypoxic respiratory failure: Suspect this is due to aspiration pneumonia during admission that was only later seen on subsequent CT at lung bases.  - s/p abx - Wean oxygen as tolerated  Acute metabolic encephalopathy: Multifactorial.  - Continue delirium precautions - Continue lactulose with hyperammonemia.   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 ?if related to liver disease.  - Continue monitoring, not continuing promacta 25mg  daily.   Covid-19 infection: Appears incidental. Was not able to take paxlovid or mulnupiravir due to LFT derangements or paxlovid due to NPO/OGT. Outside window of potential benefit and never needed steroids.  - Continue isolation x10 days from positive test (12/27 at ~9am).   Severe sepsis with lactic acidosis: Resolved. Source remains unclear. Completed ceftriaxone x7 days as of 1/2.   AKI, solitary kidney s/p nephrectomy due to RCC:  - SCr continues improving, unclear where CrCl will settle. Will continue monitoring intermittently to minimize blood draws.   Moderate protein calorie malnutrition:  - Encourage enteral intake, has removed tube.   Hypernatremia: Resolved. Anticipate continued improvement since he's clinically  stabilizing.   T2DM: HbA1c was 7.6% on 12/14/2020 (PTA), 6.2% during this admission (before transfusion) and has been <6.5% on multiple checks since 2016.  - PCP follow up  recommended. Continue CBGs, change to AC/HS to ensure tight glycemic control.   HTN:  - Holding lisinopril with AKI.  - Initial permissive HTN, will now start norvasc 5mg .   Metastatic recurrent melanoma: LUL lesion, mediastinal adenopathy. s/p 4 cycles ipi/nivo.  - Outpatient follow up to continue - Note 2 new liver lesions on CT on Jan 03, 2021 > recommend further evaluation as outpatient with dedicated multiphase abdominal MRI w/contrast.  Recurrent low grade urothelial carcinoma: s/p TURBT x3 in total.  - Continue outpatient follow up. Continue tamsulosin  DVT prophylaxis: SCDs Code Status: Full Family Communication: None at bedside Disposition Plan:  Status is: Inpatient  Remains inpatient appropriate because: Needs PT/OT evaluations, pushing oral intake.   Consultants:  PCCM GI  Procedures:  EGD  Antimicrobials: Ceftriaxone x7 days   Subjective: Ate a full breakfast today, denies pain anywhere. No bleeding. Hasn't gotten out of bed yet.   Objective: Vitals:   01/07/21 0000 01/07/21 0401 01/07/21 0843 01/07/21 1131  BP: (!) 153/84 (!) 176/94 (!) 166/88 (!) 157/85  Pulse: 87 86 85 85  Resp: (!) 26 (!) 24 (!) 24 18  Temp: 98.3 F (36.8 C) 97.9 F (36.6 C) 97.8 F (36.6 C)   TempSrc: Oral Oral Oral   SpO2: 100% 100% 100% 100%  Weight:        Intake/Output Summary (Last 24 hours) at 01/07/2021 1642 Last data filed at 01/06/2021 1800 Gross per 24 hour  Intake --  Output 190 ml  Net -190 ml   Filed Weights   12/31/20 0500 01/01/21 0500 01/05/21 0500  Weight: 77 kg 86.4 kg 86.6 kg    Gen: 76 y.o. male in no distress, chronically very ill appearing Pulm: Non-labored breathing. Clear to auscultation bilaterally.  CV: Regular rate and rhythm. No murmur, rub, or gallop. No JVD, trace pedal edema. GI: Abdomen soft, distended, nontender, with normoactive bowel sounds. No organomegaly or masses felt. Ext: Warm, no deformities Skin: No rashes, lesions or ulcers on  visualized skin.  Neuro: Drowsy but appropriately responsive and oriented. No focal neurological deficits. No asterixis Psych: Judgement and insight appear fair. Mood & affect appropriate.   Data Reviewed: I have personally reviewed following labs and imaging studies  CBC: Recent Labs  Lab 01/02/21 0407 01/03/21 0351 01/04/21 0418 01/05/21 0511 01/07/21 1234  WBC 10.4 11.2* 10.8* 10.1 12.2*  HGB 7.0* 6.8* 8.2* 8.0* 9.9*  HCT 21.3* 21.6* 25.5* 25.8* 31.4*  MCV 97.7 101.4* 97.3 102.4* 102.3*  PLT 231 256 245 229 867   Basic Metabolic Panel: Recent Labs  Lab 01/04/21 0418 01/04/21 1955 01/05/21 0511 01/05/21 0703 01/05/21 1616 01/06/21 0600 01/07/21 1234  NA 147*  --  148* 149*  --  146* 145  K 3.6  --  3.4* 3.3*  --  3.7 4.1  CL 115*  --  119* 121*  --  120* 118*  CO2 18*  --  18* 20*  --  20* 20*  GLUCOSE 393*  --  516* 199*  --  180* 218*  BUN 83*  --  67* 73*  --  60* 49*  CREATININE 3.40*  --  2.85* 3.03*  --  2.47*   2.55* 2.08*  CALCIUM 7.5*  --  7.4* 7.3*  --  7.7* 8.0*  MG 1.8 1.7 1.7  --  2.6* 2.1  --   PHOS 3.5 3.5 3.7  --  4.0 4.0  --    GFR: Estimated Creatinine Clearance: 32.7 mL/min (A) (by C-G formula based on SCr of 2.08 mg/dL (H)). Liver Function Tests: Recent Labs  Lab 01/01/21 0409 01/02/21 0407 01/03/21 0351 01/04/21 0418 01/07/21 1234  AST 247* 108* 64* 44* 30  ALT 348* 231* 165* 121* 64*  ALKPHOS 94 91 107 108 151*  BILITOT 2.5* 2.3* 2.0* 2.2* 1.4*  PROT 4.3* 4.3* 4.3* 4.3* 5.0*  ALBUMIN 1.8* 1.7* 1.7* 1.7* 2.1*   No results for input(s): LIPASE, AMYLASE in the last 168 hours. No results for input(s): AMMONIA in the last 168 hours. Coagulation Profile: Recent Labs  Lab 01/01/21 0409  INR 1.2   Cardiac Enzymes: No results for input(s): CKTOTAL, CKMB, CKMBINDEX, TROPONINI in the last 168 hours. BNP (last 3 results) No results for input(s): PROBNP in the last 8760 hours. HbA1C: No results for input(s): HGBA1C in the last 72  hours. CBG: Recent Labs  Lab 01/06/21 2030 01/07/21 0003 01/07/21 0416 01/07/21 0842 01/07/21 1232  GLUCAP 212* 137* 120* 122* 185*   Lipid Profile: No results for input(s): CHOL, HDL, LDLCALC, TRIG, CHOLHDL, LDLDIRECT in the last 72 hours. Thyroid Function Tests: No results for input(s): TSH, T4TOTAL, FREET4, T3FREE, THYROIDAB in the last 72 hours. Anemia Panel: No results for input(s): VITAMINB12, FOLATE, FERRITIN, TIBC, IRON, RETICCTPCT in the last 72 hours. Urine analysis:    Component Value Date/Time   COLORURINE YELLOW 12/29/2020 1012   APPEARANCEUR CLEAR 12/29/2020 1012   LABSPEC 1.015 12/29/2020 1012   PHURINE 5.5 12/29/2020 1012   GLUCOSEU 100 (A) 12/29/2020 1012   HGBUR NEGATIVE 12/29/2020 1012   BILIRUBINUR NEGATIVE 12/29/2020 1012   BILIRUBINUR negative 02/17/2015 1336   KETONESUR NEGATIVE 12/29/2020 1012   PROTEINUR NEGATIVE 12/29/2020 1012   UROBILINOGEN negative 02/17/2015 1336   UROBILINOGEN 0.2 01/27/2014 2028   NITRITE NEGATIVE 12/29/2020 1012   LEUKOCYTESUR NEGATIVE 12/29/2020 1012   Recent Results (from the past 240 hour(s))  Resp Panel by RT-PCR (Flu A&B, Covid) Nasopharyngeal Swab     Status: Abnormal   Collection Time: 12/29/20  9:33 AM   Specimen: Nasopharyngeal Swab; Nasopharyngeal(NP) swabs in vial transport medium  Result Value Ref Range Status   SARS Coronavirus 2 by RT PCR POSITIVE (A) NEGATIVE Final    Comment: (NOTE) SARS-CoV-2 target nucleic acids are DETECTED.  The SARS-CoV-2 RNA is generally detectable in upper respiratory specimens during the acute phase of infection. Positive results are indicative of the presence of the identified virus, but do not rule out bacterial infection or co-infection with other pathogens not detected by the test. Clinical correlation with patient history and other diagnostic information is necessary to determine patient infection status. The expected result is Negative.  Fact Sheet for  Patients: EntrepreneurPulse.com.au  Fact Sheet for Healthcare Providers: IncredibleEmployment.be  This test is not yet approved or cleared by the [place] FDA and  has been authorized for detection and/or diagnosis of SARS-CoV-2 by FDA under an Emergency Use Authorization (EUA).  This EUA will remain in effect (meaning this test can be used) for the duration of  the COVID-19 declaration under Section 564(b)(1) of the A ct, 21 U.S.C. section 360bbb-3(b)(1), unless the authorization is terminated or revoked sooner.     Influenza A by PCR NEGATIVE NEGATIVE Final   Influenza B by PCR NEGATIVE NEGATIVE Final    Comment: (NOTE) The Xpert Xpress SARS-CoV-2/FLU/RSV plus assay is intended  as an aid in the diagnosis of influenza from Nasopharyngeal swab specimens and should not be used as a sole basis for treatment. Nasal washings and aspirates are unacceptable for Xpert Xpress SARS-CoV-2/FLU/RSV testing.  Fact Sheet for Patients: EntrepreneurPulse.com.au  Fact Sheet for Healthcare Providers: IncredibleEmployment.be  This test is not yet approved or cleared by the [place] FDA and has been authorized for detection and/or diagnosis of SARS-CoV-2 by FDA under an Emergency Use Authorization (EUA). This EUA will remain in effect (meaning this test can be used) for the duration of the COVID-19 declaration under Section 564(b)(1) of the Act, 21 U.S.C. section 360bbb-3(b)(1), unless the authorization is terminated or revoked.  Performed at [facility], [address] [place], [place] [address]   Urine Culture     Status: None   Collection Time: 12/29/20 10:13 AM   Specimen: Urine, Catheterized  Result Value Ref Range Status   Specimen Description   Final    URINE, CATHETERIZED Performed at [facility], [address] [place], [place] [address]    Special Requests   Final    NONE Performed at [facility], [address], [place], [place] [address]    Culture   Final    NO GROWTH Performed at [facility], [place] [address] [place], [place] [address]    Report Status 12/31/2020 FINAL  Final  Culture, blood (routine x 2)     Status: None   Collection Time: 12/29/20 11:01 AM   Specimen: BLOOD RIGHT HAND  Result Value Ref Range Status   Specimen Description BLOOD RIGHT HAND  Final   Special Requests   Final    BOTTLES DRAWN AEROBIC AND ANAEROBIC Blood Culture adequate volume   Culture   Final    NO GROWTH 5 DAYS Performed at [facility], [address], [place], [place] [address]    Report Status 01/03/2021 FINAL  Final  Culture, blood (routine x 2)     Status: None   Collection Time: 12/29/20 11:01 AM   Specimen: BLOOD LEFT HAND  Result Value Ref Range Status   Specimen Description BLOOD LEFT HAND  Final   Special Requests   Final    BOTTLES DRAWN AEROBIC AND ANAEROBIC Blood Culture results may not be optimal due to an inadequate volume of blood received in culture bottles   Culture   Final    NO GROWTH 5 DAYS Performed at [facility], [address] [place], [place] [address]    Report Status 01/03/2021 FINAL  Final  MRSA Next Gen by PCR, Nasal     Status: None   Collection Time: 12/29/20 10:31 PM   Specimen: Nasal Mucosa; Nasal Swab  Result Value Ref Range Status   MRSA by PCR Next Gen NOT DETECTED NOT DETECTED Final    Comment: (NOTE) The GeneXpert MRSA Assay (FDA approved for NASAL specimens only), is one component of a comprehensive MRSA colonization surveillance program. It is not intended to diagnose MRSA infection nor to guide or monitor treatment for MRSA infections. Test performance is not FDA approved in patients less than 32 years old. Performed at [facility], [place] [address] [place], [place] [address]       Radiology Studies: No results found.  Scheduled Meds:  sodium chloride   Intravenous Once   alteplase  2 mg Intracatheter Once    artificial tears   Both Eyes QHS   Chlorhexidine Gluconate Cloth  6 each Topical Daily   feeding supplement (  GLUCERNA SHAKE)  237 mL Oral TID BM   influenza vaccine adjuvanted  0.5 mL Intramuscular Tomorrow-1000   insulin aspart  0-15 Units Subcutaneous TID WC   lactulose  20 g Oral TID   mouth rinse  15 mL Mouth Rinse BID   multivitamin with minerals  1 tablet Oral Daily   pantoprazole  40 mg Oral BID   Ensure Max Protein  11 oz Oral Daily   sodium chloride flush  10-40 mL Intracatheter Q12H   tamsulosin  0.4 mg Oral Daily   Continuous Infusions:  sodium chloride Stopped (01/02/21 0856)     LOS: 9 days    Patrecia Pour, MD [facility] [email or web address] 01/07/2021, 4:42 PM

## 2021-01-07 NOTE — Evaluation (Signed)
Physical Therapy Evaluation Patient Details Name: Dalton Miller MRN: [id number] DOB: 1945/03/05 Today's Date: 01/07/2021  History of Present Illness  76 yo male presents to ED on 12/27 with unresponsiveness, hypoxia. Pt with recent shoulder surgery. ETT 12/27-1/3; CTH and MRI show acute R MCA CVA. Pt also with heme + stools, covid +. PMH includes OA, renal cell cancer s/p nephrectomy, melanoma with lung mets and urothelial cancer, cellulitis, HLD, HTN, DMII, COPD, R TKA,  Clinical Impression   Pt presents with L sided weakness, dyspnea on exertion, L inattention, decreased sensation LUE/LE, max difficulty mobilizing, and decreased activity tolerance vs baseline. Pt to benefit from acute PT to address deficits. Pt requiring mod-max assist for bed mobility and rise to stand, could not progress to stepping with +1 assist. At baseline, pt is independent and would like to return to highest level of function post-acutely, has son at home with him 24/7. PT notified RN pt needs new pulse ox. PT to progress mobility as tolerated, and will continue to follow acutely.         Recommendations for follow up therapy are one component of a multi-disciplinary discharge planning process, led by the attending physician.  Recommendations may be updated based on patient status, additional functional criteria and insurance authorization.  Follow Up Recommendations Acute inpatient rehab (3hours/day)    Assistance Recommended at Discharge Frequent or constant Supervision/Assistance  Patient can return home with the following  A lot of help with bathing/dressing/bathroom;Assistance with feeding;Assist for transportation;Two people to help with walking and/or transfers    Equipment Recommendations Other (comment)  Recommendations for Other Services       Functional Status Assessment Patient has had a recent decline in their functional status and demonstrates the ability to make significant improvements in function in  a reasonable and predictable amount of time.     Precautions / Restrictions Precautions Precautions: Fall Precaution Comments: on RA upon PT arrival Restrictions Weight Bearing Restrictions: No      Mobility  Bed Mobility Overal bed mobility: Needs Assistance Bed Mobility: Rolling;Sidelying to Sit;Sit to Sidelying Rolling: Mod assist Sidelying to sit: +2 for physical assistance;+2 for safety/equipment;Mod assist     Sit to sidelying: +2 for physical assistance;+2 for safety/equipment;Mod assist General bed mobility comments: mod assist for initiation of truncal translation towards L, LLE and UE management, and scooting to/from EOB. Pt able to boost self up in bed with HOB flat and use of RLE.    Transfers Overall transfer level: Needs assistance Equipment used: 1 person hand held assist Transfers: Sit to/from Stand Sit to Stand: Mod assist           General transfer comment: assist for power up, rise, steadying, initially blocking LLE but progressing to close guard on LLE. STS x1 from EOB.    Ambulation/Gait                  Stairs            Wheelchair Mobility    Modified Rankin (Stroke Patients Only) Modified Rankin (Stroke Patients Only) Pre-Morbid Rankin Score: No significant disability Modified Rankin: Severe disability     Balance Overall balance assessment: Needs assistance Sitting-balance support: Single extremity supported;Feet supported Sitting balance-Leahy Scale: Fair Sitting balance - Comments: Pt requiring max- intermittent min A support. Pt with heavy r Lean, however follows commands well to lean towards the L and forward Postural control: Right lateral lean;Posterior lean   Standing balance-Leahy Scale: Poor Standing balance comment: reliant on  PT assist                             Pertinent Vitals/Pain Pain Assessment: No/denies pain    Home Living Family/patient expects to be discharged to:: Private  residence Living Arrangements: Children (lives with youngest son) Available Help at Discharge: Family;Available 24 hours/day Type of Home: Mobile home Home Access: Ramped entrance       Home Layout: One level Home Equipment: [place] - single point;BSC/3in1      Prior Function Prior Level of Function : Independent/Modified Independent                     Hand Dominance   Dominant Hand: Right    Extremity/Trunk Assessment   Upper Extremity Assessment Upper Extremity Assessment: Defer to OT evaluation RUE Deficits / Details: recent shoulder surgery due to impingment, RTC? RUE: Shoulder pain with ROM RUE Sensation: WNL RUE Coordination: decreased gross motor LUE Deficits / Details: Minimal shoulder flexion, trace muscle contraction in elbows, wrists, and fingers. LUE Sensation: decreased light touch;decreased proprioception LUE Coordination: decreased fine motor;decreased gross motor    Lower Extremity Assessment Lower Extremity Assessment: LLE deficits/detail LLE Deficits / Details: 2+/5 DF/PF, 2/5 knee flexion/ext modified MMT in supine LLE Sensation: decreased light touch;decreased proprioception LLE Coordination: decreased fine motor;decreased gross motor    Cervical / Trunk Assessment Cervical / Trunk Assessment: Normal  Communication   Communication: No difficulties  Cognition Arousal/Alertness: Awake/alert Behavior During Therapy: WFL for tasks assessed/performed Overall Cognitive Status: Impaired/Different from baseline Area of Impairment: Orientation                 Orientation Level: Disoriented to;Time             General Comments: Pt states it is December, then corrects to November. Pt with L inattention requiring max cues to attend to L. Pt is emotional at end of session, tearful x2.        General Comments General comments (skin integrity, edema, etc.): VSS on RA. Pt on 4L @ 100% on entry, OT removed O2 and pt remained 98-100%  throuhgout all session.    Exercises     Assessment/Plan    PT Assessment Patient needs continued PT services  PT Problem List Decreased strength;Decreased mobility;Decreased balance;Decreased knowledge of use of DME;Pain;Decreased cognition;Decreased activity tolerance;Decreased coordination;Decreased safety awareness;Impaired tone       PT Treatment Interventions DME instruction;Therapeutic activities;Gait training;Therapeutic exercise;Patient/family education;Balance training;Stair training;Functional mobility training;Neuromuscular re-education    PT Goals (Current goals can be found in the Care Plan section)  Acute Rehab PT Goals PT Goal Formulation: With patient Time For Goal Achievement: 01/21/21 Potential to Achieve Goals: Good    Frequency Min 4X/week     Co-evaluation               AM-PAC PT "6 Clicks" Mobility  Outcome Measure Help needed turning from your back to your side while in a flat bed without using bedrails?: A Lot Help needed moving from lying on your back to sitting on the side of a flat bed without using bedrails?: A Lot Help needed moving to and from a bed to a chair (including a wheelchair)?: A Lot Help needed standing up from a chair using your arms (e.g., wheelchair or bedside chair)?: A Lot Help needed to walk in hospital room?: Total Help needed climbing 3-5 steps with a railing? : Total 6 Click Score: 10  End of Session   Activity Tolerance: Patient tolerated treatment well Patient left: in bed;with bed alarm set;with call bell/phone within reach;with SCD's reapplied Nurse Communication: Mobility status PT Visit Diagnosis: Other abnormalities of gait and mobility (R26.89);Difficulty in walking, not elsewhere classified (R26.2)    Time: 7703-4035 PT Time Calculation (min) (ACUTE ONLY): 24 min   Charges:   PT Evaluation $PT Eval Low Complexity: 1 Low PT Treatments $Therapeutic Activity: 8-22 mins      Stacie Glaze, PT DPT Acute  Rehabilitation Services Pager [phone]  Office [phone]   Roxine Caddy E Ruffin Pyo 01/07/2021, 5:55 PM

## 2021-01-07 NOTE — Progress Notes (Signed)
°  Inpatient Rehabilitation Admissions Coordinator   Inpatient rehab consult./prescreen received. Noted COVID + 12/29/20. Patients are eligible to be considered for admit to the [facility] when cleared from airborne precautions by acute MD or Infectious disease regardless of onset day.  I will place order for rehab consult per protocol.Please call me with any questions.    Danne Baxter, RN, MSN Rehab Admissions Coordinator [phone] 01/07/2021 8:25 PM

## 2021-01-07 NOTE — Evaluation (Signed)
Occupational Therapy Evaluation Patient Details Name: Dalton Miller MRN: [id number] DOB: [birth year] Today's Date: 01/07/2021   History of Present Illness 76 yo male presents to ED on 12/27 with unresponsiveness, hypoxia. Pt with recent shoulder surgery. ETT 12/27-1/3; CTH and MRI show acute R MCA CVA. Pt also with heme + stools, covid +. PMH includes OA, renal cell cancer s/p nephrectomy, melanoma with lung mets and urothelial cancer, cellulitis, HLD, HTN, DMII, COPD, R TKA,   Clinical Impression   Pt admitted for concerns listed above. PTA pt reported that he was independent with all ADL's and IADL's, including driving. At this time, pt presents with L sided inattention, weakness, and incoordination. He is requiring max A +2 for bed mobility and ADL's. Additionally, pt with poor balance, requiring min-max support EOB due to heavy R sided and posterior lean, however he is able to correct with verbal cuing. Family present throughout session and very supportive. Recommending AIR to maximize his return to independence and decrease caregiver burden. OT will continue to follow acutely.       Recommendations for follow up therapy are one component of a multi-disciplinary discharge planning process, led by the attending physician.  Recommendations may be updated based on patient status, additional functional criteria and insurance authorization.   Follow Up Recommendations  Acute inpatient rehab (3hours/day)    Assistance Recommended at Discharge Frequent or constant Supervision/Assistance  Patient can return home with the following Two people to help with walking and/or transfers;Two people to help with bathing/dressing/bathroom;Assistance with cooking/housework;Direct supervision/assist for medications management;Direct supervision/assist for financial management;Help with stairs or ramp for entrance    Functional Status Assessment  Patient has had a recent decline in their functional status and  demonstrates the ability to make significant improvements in function in a reasonable and predictable amount of time.  Equipment Recommendations  Tub/shower bench;Other (comment) (RW)    Recommendations for Other Services Rehab consult     Precautions / Restrictions Precautions Precautions: Fall Precaution Comments: Watch O2 Restrictions Weight Bearing Restrictions: No      Mobility Bed Mobility Overal bed mobility: Needs Assistance Bed Mobility: Rolling;Sidelying to Sit;Sit to Sidelying Rolling: Max assist Sidelying to sit: Max assist;+2 for physical assistance;+2 for safety/equipment     Sit to sidelying: Max assist;+2 for physical assistance;+2 for safety/equipment General bed mobility comments: Pt getting up on his weak side.    Transfers                   General transfer comment: Differed due to poor sitting balance and needing +2 assist      Balance Overall balance assessment: Needs assistance Sitting-balance support: Single extremity supported;Feet supported Sitting balance-Leahy Scale: Poor Sitting balance - Comments: Pt requiring max- intermittent min A support. Pt with heavy r Lean, however follows commands well to lean towards the L and forward Postural control: Right lateral lean;Posterior lean                                 ADL either performed or assessed with clinical judgement   ADL Overall ADL's : Needs assistance/impaired Eating/Feeding: Set up;Cueing for sequencing;Cueing for compensatory techinques;Bed level   Grooming: Minimal assistance;Bed level   Upper Body Bathing: Maximal assistance;Bed level   Lower Body Bathing: Maximal assistance;+2 for physical assistance;+2 for safety/equipment;Sitting/lateral leans;Sit to/from stand   Upper Body Dressing : Maximal assistance;Bed level   Lower Body Dressing: Maximal assistance;+2 for physical assistance;+2  for safety/equipment;Sitting/lateral leans;Sit to/from stand   Toilet  Transfer: Maximal assistance;+2 for physical assistance;+2 for safety/equipment;Stand-pivot   Toileting- Clothing Manipulation and Hygiene: Maximal assistance;+2 for physical assistance;+2 for safety/equipment;Sitting/lateral lean;Sit to/from stand         General ADL Comments: Due to poor balance, incoordination/weakness of LUE, pt requiring max A +2 for all ADL's, difficulty with balnce EOB, some needing to be completed with bed in chair position     Vision Baseline Vision/History: 1 Wears glasses Ability to See in Adequate Light: 0 Adequate Patient Visual Report: Blurring of vision Vision Assessment?: Yes Eye Alignment: Within Functional Limits Ocular Range of Motion: Restricted on the left Alignment/Gaze Preference: Gaze right Tracking/Visual Pursuits: Decreased smoothness of horizontal tracking;Decreased smoothness of vertical tracking;Decreased smoothness of eye movement to LEFT superior field;Decreased smoothness of eye movement to LEFT inferior field;Requires cues, head turns, or add eye shifts to track Saccades: Undershoots;Decreased speed of saccadic movement Convergence: Impaired - to be further tested in functional context Visual Fields: Left homonymous hemianopsia;Left visual field deficit Depth Perception: Undershoots     Perception     Praxis      Pertinent Vitals/Pain Pain Assessment: No/denies pain     Hand Dominance Right   Extremity/Trunk Assessment Upper Extremity Assessment Upper Extremity Assessment: LUE deficits/detail;RUE deficits/detail RUE Deficits / Details: recent shoulder surgery due to impingment RUE: Shoulder pain with ROM RUE Sensation: WNL RUE Coordination: decreased gross motor LUE Deficits / Details: Minimal shoulder flexion, trace muscle contraction in elbows, wrists, and fingers. LUE Sensation: decreased light touch;decreased proprioception LUE Coordination: decreased gross motor;decreased fine motor   Lower Extremity Assessment Lower  Extremity Assessment: Defer to PT evaluation   Cervical / Trunk Assessment Cervical / Trunk Assessment: Kyphotic   Communication Communication Communication: No difficulties   Cognition Arousal/Alertness: Awake/alert Behavior During Therapy: WFL for tasks assessed/performed Overall Cognitive Status: Within Functional Limits for tasks assessed                                       General Comments  VSS on RA. Pt on 4L @ 100% on entry, OT removed O2 and pt remained 98-100% throuhgout all session.    Exercises     Shoulder Instructions      Home Living Family/patient expects to be discharged to:: Private residence Living Arrangements: Children (lives with youngest son) Available Help at Discharge: Family;Available 24 hours/day Type of Home: Mobile home Home Access: Ramped entrance     Home Layout: One level     Bathroom Shower/Tub: Teacher, early years/pre: Standard Bathroom Accessibility: Yes How Accessible: Accessible via walker Home Equipment: [place] - single point          Prior Functioning/Environment Prior Level of Function : Independent/Modified Independent                        OT Problem List: Decreased strength;Decreased range of motion;Decreased activity tolerance;Impaired balance (sitting and/or standing);Impaired vision/perception;Decreased coordination;Decreased safety awareness;Decreased knowledge of use of DME or AE;Impaired sensation;Impaired tone;Impaired UE functional use      OT Treatment/Interventions: Self-care/ADL training;Therapeutic exercise;Energy conservation;DME and/or AE instruction;Therapeutic activities;Visual/perceptual remediation/compensation;Patient/family education;Balance training    OT Goals(Current goals can be found in the care plan section) Acute Rehab OT Goals Patient Stated Goal: To get better and go home OT Goal Formulation: With patient/family Time For Goal Achievement:  01/21/21 Potential to Achieve Goals:  Good ADL Goals Pt Will Perform Grooming: with min assist;with adaptive equipment;sitting Pt Will Perform Upper Body Bathing: with min assist;sitting Pt Will Perform Lower Body Bathing: with min assist;with adaptive equipment;sitting/lateral leans;sit to/from stand Pt Will Perform Upper Body Dressing: with min assist;sitting Pt Will Perform Lower Body Dressing: with min assist;sitting/lateral leans;sit to/from stand;with adaptive equipment Pt Will Transfer to Toilet: with min assist;stand pivot transfer Pt Will Perform Toileting - Clothing Manipulation and hygiene: with min assist;sitting/lateral leans;sit to/from stand;with adaptive equipment Additional ADL Goal #1: Pt will attend to items on L side of his body 50% of session  OT Frequency: Min 2X/week    Co-evaluation              AM-PAC OT "6 Clicks" Daily Activity     Outcome Measure Help from another person eating meals?: A Little Help from another person taking care of personal grooming?: A Lot Help from another person toileting, which includes using toliet, bedpan, or urinal?: Total Help from another person bathing (including washing, rinsing, drying)?: A Lot Help from another person to put on and taking off regular upper body clothing?: A Lot Help from another person to put on and taking off regular lower body clothing?: Total 6 Click Score: 11   End of Session Equipment Utilized During Treatment: Oxygen Nurse Communication: Mobility status;Other (comment) (Pt left off O2 at end of session)  Activity Tolerance: Patient tolerated treatment well Patient left: in bed;with call bell/phone within reach;with bed alarm set  OT Visit Diagnosis: Unsteadiness on feet (R26.81);Other abnormalities of gait and mobility (R26.89);Muscle weakness (generalized) (M62.81);Hemiplegia and hemiparesis Hemiplegia - Right/Left: Left Hemiplegia - dominant/non-dominant: Non-Dominant Hemiplegia - caused by:  Cerebral infarction                Time: 1400-1434 OT Time Calculation (min): 34 min Charges:  OT General Charges $OT Visit: 1 Visit OT Evaluation $OT Eval Moderate Complexity: 1 Mod OT Treatments $Therapeutic Activity: 8-22 mins  Cordella Nyquist H., OTR/L Acute Rehabilitation  Odean Fester Elane Fardeen Steinberger 01/07/2021, 5:19 PM

## 2021-01-07 NOTE — Plan of Care (Signed)
°  Problem: Education: Goal: Knowledge of disease or condition will improve Outcome: Progressing Goal: Knowledge of secondary prevention will improve (SELECT ALL) Outcome: Progressing Goal: Knowledge of patient specific risk factors will improve (INDIVIDUALIZE FOR PATIENT) Outcome: Progressing Goal: Individualized Educational Video(s) Outcome: Progressing   Problem: Education: Goal: Knowledge of disease or condition will improve Outcome: Progressing Goal: Knowledge of secondary prevention will improve (SELECT ALL) Outcome: Progressing Goal: Knowledge of patient specific risk factors will improve (INDIVIDUALIZE FOR PATIENT) Outcome: Progressing   Problem: Coping: Goal: Will identify appropriate support needs Outcome: Progressing   Problem: Health Behavior/Discharge Planning: Goal: Ability to manage health-related needs will improve Outcome: Progressing   Problem: Self-Care: Goal: Ability to participate in self-care as condition permits will improve Outcome: Progressing Goal: Verbalization of feelings and concerns over difficulty with self-care will improve Outcome: Progressing   Problem: Nutrition: Goal: Risk of aspiration will decrease Outcome: Progressing   Problem: Ischemic Stroke/TIA Tissue Perfusion: Goal: Complications of ischemic stroke/TIA will be minimized Outcome: Progressing   Problem: Education: Goal: Knowledge of General Education information will improve Description: Including pain rating scale, medication(s)/side effects and non-pharmacologic comfort measures Outcome: Progressing   Problem: Health Behavior/Discharge Planning: Goal: Ability to manage health-related needs will improve Outcome: Progressing   Problem: Clinical Measurements: Goal: Ability to maintain clinical measurements within normal limits will improve Outcome: Progressing Goal: Will remain free from infection Outcome: Progressing Goal: Diagnostic test results will improve Outcome:  Progressing Goal: Respiratory complications will improve Outcome: Progressing Goal: Cardiovascular complication will be avoided Outcome: Progressing   Problem: Activity: Goal: Risk for activity intolerance will decrease Outcome: Progressing   Problem: Nutrition: Goal: Adequate nutrition will be maintained Outcome: Progressing   Problem: Coping: Goal: Level of anxiety will decrease Outcome: Progressing   Problem: Elimination: Goal: Will not experience complications related to bowel motility Outcome: Progressing Goal: Will not experience complications related to urinary retention Outcome: Progressing   Problem: Pain Managment: Goal: General experience of comfort will improve Outcome: Progressing   Problem: Safety: Goal: Ability to remain free from injury will improve Outcome: Progressing

## 2021-01-08 ENCOUNTER — Telehealth: Payer: Self-pay

## 2021-01-08 DIAGNOSIS — I8501 Esophageal varices with bleeding: Secondary | ICD-10-CM

## 2021-01-08 DIAGNOSIS — K7682 Hepatic encephalopathy: Secondary | ICD-10-CM | POA: Diagnosis not present

## 2021-01-08 DIAGNOSIS — R7989 Other specified abnormal findings of blood chemistry: Secondary | ICD-10-CM | POA: Diagnosis not present

## 2021-01-08 DIAGNOSIS — K7031 Alcoholic cirrhosis of liver with ascites: Secondary | ICD-10-CM | POA: Diagnosis not present

## 2021-01-08 LAB — GLUCOSE, CAPILLARY
Glucose-Capillary: 145 mg/dL — ABNORMAL HIGH (ref 70–99)
Glucose-Capillary: 134 mg/dL — ABNORMAL HIGH (ref 70–99)
Glucose-Capillary: 210 mg/dL — ABNORMAL HIGH (ref 70–99)
Glucose-Capillary: 211 mg/dL — ABNORMAL HIGH (ref 70–99)

## 2021-01-08 LAB — BASIC METABOLIC PANEL
Anion gap: 8 (ref 5–15)
BUN: 42 mg/dL — ABNORMAL HIGH (ref 8–23)
CO2: 19 mmol/L — ABNORMAL LOW (ref 22–32)
Calcium: 7.9 mg/dL — ABNORMAL LOW (ref 8.9–10.3)
Chloride: 119 mmol/L — ABNORMAL HIGH (ref 98–111)
Creatinine, Ser: 1.88 mg/dL — ABNORMAL HIGH (ref 0.61–1.24)
GFR, Estimated: 37 mL/min — ABNORMAL LOW (ref >60)
Glucose, Bld: 147 mg/dL — ABNORMAL HIGH (ref 70–99)
Potassium: 3.7 mmol/L (ref 3.5–5.1)
Sodium: 146 mmol/L — ABNORMAL HIGH (ref 135–145)

## 2021-01-08 LAB — CBC
HCT: 30.8 % — ABNORMAL LOW (ref 39.0–52.0)
Hemoglobin: 9.5 g/dL — ABNORMAL LOW (ref 13.0–17.0)
MCH: 31.6 pg (ref 26.0–34.0)
MCHC: 30.8 g/dL (ref 30.0–36.0)
MCV: 102.3 fL — ABNORMAL HIGH (ref 80.0–100.0)
Platelets: 224 10*3/uL (ref 150–400)
RBC: 3.01 MIL/uL — ABNORMAL LOW (ref 4.22–5.81)
RDW: 18.9 % — ABNORMAL HIGH (ref 11.5–15.5)
WBC: 12 10*3/uL — ABNORMAL HIGH (ref 4.0–10.5)
nRBC: 0 % (ref 0.0–0.2)

## 2021-01-08 MED ORDER — AMLODIPINE BESYLATE 10 MG PO TABS
10.0000 mg | ORAL_TABLET | Freq: Every day | ORAL | Status: DC
  Administered 2021-01-09 – 2021-01-12 (×4): 10 mg via ORAL
  Filled 2021-01-08 (×4): qty 1

## 2021-01-08 MED ORDER — LACTULOSE 10 GM/15ML PO SOLN
10.0000 g | Freq: Three times a day (TID) | ORAL | Status: DC
  Administered 2021-01-08 (×2): 10 g via ORAL
  Filled 2021-01-08 (×2): qty 30

## 2021-01-08 MED ORDER — RIFAXIMIN 550 MG PO TABS
550.0000 mg | ORAL_TABLET | Freq: Two times a day (BID) | ORAL | Status: DC
  Administered 2021-01-08 – 2021-01-12 (×9): 550 mg via ORAL
  Filled 2021-01-08 (×9): qty 1

## 2021-01-08 NOTE — Telephone Encounter (Signed)
-----   Message from Levin Erp, [place] sent at 01/08/2021 11:35 AM EST ----- Regarding: follow up Patient needs follow up with Amy or Dr. Fuller Plan in 3-4 weeks for decompensated cirrhosis.  Thanks-JLL

## 2021-01-08 NOTE — Telephone Encounter (Signed)
Patient has been scheduled for a hospital follow up with Dr. Fuller Plan on Tuesday, 02/03/20 at 11:10 am. [name] is aware of pt's appt. I have mailed letter to patient's home address with appt information as well.

## 2021-01-08 NOTE — Progress Notes (Addendum)
Re-Consultation  Referring Provider:  Dr. Bonner Puna    Primary Care Physician:  [facility] Primary Gastroenterologist: None/unassigned        Reason for Re-Consultation: Cirrhosis status post variceal bleed questions regarding medicines going forward             HPI:   Dalton Miller is a 76 y.o. male with past medical history as listed below including renal cell cancer, who initially presented to the hospital with an acute right MCA stroke and an acute GI bleed on 12/30/2020.  We are reconsulted now in regards to the patient's cirrhosis going forward and long-term plans for medications.    12/30/2020 EGD with grade 2 esophageal varices and one varix with erosion and 1 with a red spot banded x4, blood clots in the gastric fundus and duodenum was normal.    12/31/2020 patient seen by our service and noted to be COVID-19 positive at that time had no further bleeding.  Also noted to have acute metabolic encephalopathy thought multifactorial.  Also AKI (solitary kidney status post nephrectomy), acute hepatic injury with no evidence of cirrhosis by ultrasound but varices on EGD, CT was recommended in the next few days to rule out underlying decompensated liver disease.  Had a history of chronic thrombocytopenia has been on Promacta as an outpatient which was held.    01/01/2021 patient remained intubated with no further active bleeding.  Noncontrast CT the abdomen and pelvis was ordered.    01/02/21 patient was responding to commands.  At that point still requiring low-dose pressors given COVID-19 infection with respiratory failure/sepsis.  Creatinine had started trending down.    01/03/2021 CT of the abdomen pelvis without contrast showed morphologic features of the liver suggestive of early cirrhosis, moderate volume ascites, 2 new low-attenuation structures within the dome of the liver which could not exclude benign or malignant lesion, recommended abdominal MRI.  Wall thickening of the  gastric antrum also with several areas of nonspecific bowel wall edema involving the left colon sigmoid and rectum which were nonspecific in the setting of portal venous hypertension, bilateral lower lobe airspace consolidation, right greater than left suspicious for pneumonia and/or aspiration pneumonia and small bilateral effusions.  He was still on lactulose enemas for acute metabolic encephalopathy.  That time discussed repeat EGD in 4 to 6 weeks for repeat banding if needed.  He did come off pressors    01/04/2021 patient noted to still be requiring pressors, hemoglobin stable.  Did describe getting a 1 unit transfusion of PRBCs on 1/1 and some oozing of melena which was thought related to possible clearing of old blood.  He was continued on twice daily PPI and recommended eventual MRI to evaluate indeterminate liver lesions.  Core track was placed that day.    01/05/2021 patient continued on Lactulose 3 times daily, he was extubated.  He had finished his Octreotide.    01/06/2021 at that time discussed possibly adding an antiplatelet given his acute right MCA once cleared by GI.  He was continued on Lactulose 3 times daily though it was thought likely his acute metabolic encephalopathy was multifactorial with acute liver injury, AKI, sepsis, stroke and hypoxia as well as hyperammonemia.    01/07/2021 patient transferred to the hospitalist care from the ICU.  At that time discussed that nonselective beta-blocker was not initiated due to first degree heart block with a rate in the 60s.  That time some question about whether or not his thrombocytopenia was  related to his liver disease even though it was thought to be ITP in the past and related to immunotherapy.    Today, the patient tells me that in general he is feeling about the same as before he came in.  He really has had no abdominal pain, no nausea or vomiting.  Describes that he has some distention of his abdomen which he is not sure if it is normal for him  or not.  He feels like maybe it has gotten a little bigger than normal.  But is not uncomfortable.  Is aware that he has a rectal tube and would like to get this out if possible.    Denies any other acute complaints or concerns today.     Past Medical History:  Diagnosis Date   Acquired renal cyst of right kidney    Arthritis    Cancer ([place])    renal cell    Erectile dysfunction    Heart murmur    CHILDHOOD   Hematuria    left ureteral bleeding   History of cellulitis    2012 left wrist   History of kidney stones    Hyperlipidemia    Hypertension    Metastatic melanoma to lung, left ([place])    Nephrolithiasis    bilateral non-obstructive per ct 07-02-2015   Type 2 diabetes mellitus ([place])    Wears glasses     Past Surgical History:  Procedure Laterality Date   CARPAL TUNNEL RELEASE Right 1990  approx   COLONOSCOPY  03/04/2010   CYSTOSCOPY W/ RETROGRADES Left 10/28/2015   Procedure: CYSTOSCOPY WITH LEFT RETROGRADE PYELOGRAM;  Surgeon: Cleon Gustin, MD;  Location: [facility];  Service: Urology;  Laterality: Left;   CYSTOSCOPY W/ URETERAL STENT PLACEMENT Left 10/28/2015   Procedure: CYSTOSCOPY WITH LEFT URETERAL STENT EXCHANGE;  Surgeon: Cleon Gustin, MD;  Location: [facility];  Service: Urology;  Laterality: Left;   CYSTOSCOPY WITH RETROGRADE PYELOGRAM, URETEROSCOPY AND STENT PLACEMENT Bilateral 09/17/2015   Procedure: CYSTOSCOPY WITH BILATERAL RETROGRADE PYELOGRAM, LEFT URETEROSCOPY WITH URETERAL BIOPSY AND  STENT PLACEMENT;  Surgeon: Irine Seal, MD;  Location: [facility];  Service: Urology;  Laterality: Bilateral;   ESOPHAGEAL BANDING  12/30/2020   Procedure: ESOPHAGEAL BANDING;  Surgeon: Ladene Artist, MD;  Location: [facility] END[facility]COPY;  Service: Endoscopy;;   ESOPHAGOGASTRODUODENOSCOPY (EGD) WITH PROPOFOL N/A 12/30/2020   Procedure: ESOPHAGOGASTRODUODENOSCOPY (EGD) WITH PROPOFOL;  Surgeon: Ladene Artist, MD;  Location: [facility] ENDOSCOPY;   Service: Endoscopy;  Laterality: N/A;   HOLMIUM LASER APPLICATION Left 99/37/1696   Procedure: HOLMIUM LASER APPLICATION;  Surgeon: Irine Seal, MD;  Location: [facility];  Service: Urology;  Laterality: Left;   KNEE ARTHROSCOPY Right 2008   MELANOMA EXCISION  2018   Left arm   right total knee replacement  Right    2009   ROBOT ASSITED LAPAROSCOPIC NEPHROURETERECTOMY Left 12/04/2015   Procedure: XI ROBOT ASSITED LAPAROSCOPIC NEPHROURETERECTOMY;  Surgeon: Cleon Gustin, MD;  Location: [facility];  Service: Urology;  Laterality: Left;   SHOULDER ARTHROSCOPY Right 12/17/2020   Procedure: Right shoulder arthroscopy, debridement, subacromial decompression, distal clavicle resection;  Surgeon: Justice Britain, MD;  Location: [facility];  Service: Orthopedics;  Laterality: Right;  44min   STONE EXTRACTION WITH BASKET Left 09/17/2015   Procedure: STONE EXTRACTION WITH BASKET;  Surgeon: Irine Seal, MD;  Location: [facility];  Service: Urology;  Laterality: Left;   TOTAL KNEE ARTHROPLASTY Right 2009   URETEROSCOPY  10/28/2015  Procedure: DIAGNOSTIC LEFT URETEROSCOPY;  Surgeon: Cleon Gustin, MD;  Location: [facility];  Service: Urology;;   WISDOM TOOTH EXTRACTION      Family History  Problem Relation Age of Onset   Diabetes Mother    Heart disease Mother    Stroke Mother    CAD Mother    Diabetes Father    Congestive Heart Failure Father    Cancer Brother    Heart disease Son    COPD Son    Emphysema Son    Heart attack Brother     Social History   Tobacco Use   Smoking status: Every Day    Packs/day: 1.50    Years: 63.00    Pack years: 94.50    Types: Cigars, Cigarettes   Smokeless tobacco: Former    Types: Chew    Quit date: 01/03/1993   Tobacco comments:    Quit smoking cigarettes over 10 years ago- smoke 1.5 pack per day 60 years  Vaping Use   Vaping Use: Never used  Substance Use Topics   Alcohol use: No   Drug use:  No    Prior to Admission medications   Medication Sig Start Date End Date Taking? Authorizing Provider  amLODipine (NORVASC) 2.5 MG tablet Take 2.5 mg by mouth daily. 03/04/20  Yes [provider]  carboxymethylcellulose (REFRESH PLUS) 0.5 % SOLN Place 1 drop into both eyes in the morning, at noon, and at bedtime. 04/24/20  Yes [provider]  cyclobenzaprine (FLEXERIL) 10 MG tablet Take 1 tablet (10 mg total) by mouth 3 (three) times daily as needed for muscle spasms. 12/17/20  Yes Shuford, Olivia Mackie, PA-C  eltrombopag (PROMACTA) 25 MG tablet Take 25 mg by mouth daily. 04/08/20  Yes [provider]  loratadine (CLARITIN) 10 MG tablet Take 10 mg by mouth daily.   Yes [provider]  naproxen (NAPROSYN) 500 MG tablet Take 1 tablet (500 mg total) by mouth 2 (two) times daily with a meal. 12/17/20  Yes Shuford, Olivia Mackie, PA-C  oxyCODONE (OXY IR/ROXICODONE) 5 MG immediate release tablet Take 5 mg by mouth every 6 (six) hours as needed. 12/24/20  Yes [provider]  pregabalin (LYRICA) 75 MG capsule Take 75 mg by mouth 2 (two) times daily. 02/28/20  Yes [provider]  Jay Schlichter Oil (REFRESH P.M. OP) Place 1 application into both eyes at bedtime.   Yes [provider]  aspirin EC 81 MG tablet Take 1 tablet (81 mg total) by mouth daily. Patient not taking: Reported on 12/09/2020 03/22/16   Evelina Dun A, FNP  atorvastatin (LIPITOR) 10 MG tablet Take 1 tablet (10 mg total) by mouth daily. Patient not taking: Reported on 12/09/2020 05/20/19   Sharion Balloon, FNP  fluticasone [facility]) 50 MCG/ACT nasal spray SPRAY 2 SPRAYS INTO EACH NOSTRIL EVERY DAY Patient not taking: Reported on 12/09/2020 05/20/19   Evelina Dun A, FNP  lisinopril (ZESTRIL) 10 MG tablet TAKE 1 TABLET (10 MG TOTAL) BY MOUTH DAILY. NEEDS TO BE SEEN BEFORE NEXT REFILL. Patient not taking: Reported on 12/09/2020 06/10/19   Evelina Dun A, FNP  ondansetron (ZOFRAN) 4 MG  tablet Take 1 tablet (4 mg total) by mouth every 8 (eight) hours as needed for nausea or vomiting. Patient not taking: Reported on 12/29/2020 12/17/20   Shuford, Olivia Mackie, PA-C  oxyCODONE-acetaminophen (PERCOCET) 5-325 MG tablet Take 1 tablet by mouth every 4 (four) hours as needed (max 6 q). Patient not taking: Reported on 12/29/2020 12/17/20  Shuford, Olivia Mackie, PA-C    Current Facility-Administered Medications  Medication Dose Route Frequency Provider Last Rate Last Admin   0.9 %  sodium chloride infusion (Manually program via Guardrails IV Fluids)   Intravenous Once Cecilie Lowers T, MD       0.9 %  sodium chloride infusion  250 mL Intravenous Continuous Frederik Pear, MD   Stopped at 01/02/21 0856   alteplase (CATHFLO ACTIVASE) injection 2 mg  2 mg Intracatheter Once Marshell Garfinkel, MD       [START ON 01/09/2021] amLODipine (NORVASC) tablet 10 mg  10 mg Oral Daily Patrecia Pour, MD       artificial tears (LACRILUBE) ophthalmic ointment   Both Eyes QHS Mannam, Praveen, MD   Given at 01/07/21 2155   Chlorhexidine Gluconate Cloth 2 % PADS 6 each  6 each Topical Daily Anders Simmonds, MD   6 each at 01/07/21 2157   feeding supplement (GLUCERNA SHAKE) (GLUCERNA SHAKE) liquid 237 mL  237 mL Oral TID BM Freddi Starr, MD   237 mL at 01/08/21 0939   influenza vaccine adjuvanted (FLUAD) injection 0.5 mL  0.5 mL Intramuscular Tomorrow-1000 Freddi Starr, MD       insulin aspart (novoLOG) injection 0-15 Units  0-15 Units Subcutaneous TID WC Patrecia Pour, MD   2 Units at 01/08/21 3016   lactulose (CHRONULAC) 10 GM/15ML solution 10 g  10 g Oral TID Patrecia Pour, MD       MEDLINE mouth rinse  15 mL Mouth Rinse BID Freddi Starr, MD   15 mL at 01/08/21 0109   multivitamin with minerals tablet 1 tablet  1 tablet Oral Daily Freddi Starr, MD   1 tablet at 01/08/21 3235   pantoprazole (PROTONIX) EC tablet 40 mg  40 mg Oral BID Tyrone Apple, RPH   40 mg at 01/08/21 5732    protein supplement (ENSURE MAX) liquid  11 oz Oral Daily Freddi Starr, MD   11 oz at 01/08/21 2025   rifaximin (XIFAXAN) tablet 550 mg  550 mg Oral BID Patrecia Pour, MD       sodium chloride flush (NS) 0.9 % injection 10-40 mL  10-40 mL Intracatheter Q12H Mannam, Praveen, MD   10 mL at 01/08/21 0939   sodium chloride flush (NS) 0.9 % injection 10-40 mL  10-40 mL Intracatheter PRN Marshell Garfinkel, MD        Allergies as of 12/29/2020   (No Known Allergies)     Review of Systems:    Constitutional: No weight loss, fever or chills Cardiovascular: No chest pain Respiratory: +cough Gastrointestinal: See HPI and otherwise negative Genitourinary: No dysuria Neurological: No headache, dizziness or syncope Musculoskeletal: No new muscle or joint pain Hematologic: No bleeding  Psychiatric: No history of depression or anxiety    Physical Exam:  Vital signs in last 24 hours: Temp:  [97.6 F (36.4 C)-98.9 F (37.2 C)] 97.6 F (36.4 C) (01/06 0742) Pulse Rate:  [67-90] 67 (01/06 0742) Resp:  [18-20] 20 (01/06 0742) BP: (138-162)/(76-100) 162/76 (01/06 0742) SpO2:  [94 %-100 %] 100 % (01/06 0742) Last BM Date: 01/06/21 General:   Pleasant Elderly chronically ill appearing Caucasian male appears to be in NAD, Well developed, Well nourished, alert and cooperative Head:  Normocephalic and atraumatic. Eyes:   PEERL, EOMI. No icterus. Conjunctiva pink. Ears:  Normal auditory acuity. Neck:  Supple Throat: Oral cavity and pharynx without inflammation, swelling or lesion.  Lungs:  Respirations even and unlabored. Lungs clear to auscultation bilaterally.   No wheezes, crackles, or rhonchi.  Heart: Normal S1, S2. No MRG. Regular rate and rhythm.1+ pitting edema b/l LE Abdomen:  Soft, mild distension, nontender. No rebound or guarding. Normal bowel sounds. No appreciable masses or hepatomegaly. Rectal:  =rectal tube in place with copious amounts of liquid brown stool present Msk:   Symmetrical without gross deformities. Peripheral pulses intact.  Extremities:  Without edema, no deformity or joint abnormality. Neurologic:  Alert and  oriented x4;  grossly normal neurologically. No asterixis Skin:   Dry and intact without significant lesions or rashes. Psychiatric: Demonstrates good judgement and reason without abnormal affect or behaviors.   LAB RESULTS: Recent Labs    01/07/21 1234 01/08/21 0500  WBC 12.2* 12.0*  HGB 9.9* 9.5*  HCT 31.4* 30.8*  PLT 233 224   BMET Recent Labs    01/06/21 0600 01/07/21 1234 01/08/21 0500  NA 146* 145 146*  K 3.7 4.1 3.7  CL 120* 118* 119*  CO2 20* 20* 19*  GLUCOSE 180* 218* 147*  BUN 60* 49* 42*  CREATININE 2.47*   2.55* 2.08* 1.88*  CALCIUM 7.7* 8.0* 7.9*   LFT Recent Labs    01/07/21 1234  PROT 5.0*  ALBUMIN 2.1*  AST 30  ALT 64*  ALKPHOS 151*  BILITOT 1.4*     Impression / Plan:   Impression: 1.  Acute blood loss anemia due to upper GI bleeding: Status post EGD 12/30/2020 with banding of varices, hemoglobin has remained stable, patient only required 1 unit of PRBCs on 1/1, now with hgb stable 2.  New decompensated cirrhosis: As seen on CT with bleeding varices and hepatic encephalopathy this admission, thought to be idiopathic with no history of alcohol abuse 3.  Acute hypoxic respiratory failure: In the setting of aspiration pneumonia during admission, status post antibiotics and wean from oxygen 4.  Acute metabolic/hepatic encephalopathy: Thought multifactorial with cirrhosis being possible cause, has been on Lactulose 3 times daily, currently on rectal tube due to frequency of stools 5.  COVID-19 infection: Appeared incidental, this is day 10, precautions ended this morning around 9 AM 6.  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  AKI: Solitary kidney status post nephrectomy due to RCC, creatinine now at  baseline 8.  Metastatic recurrent melanoma: 2 new liver lesions on CT January 03, 2021, recommended further evaluation as outpatient dedicated multiphase abdominal MRI with contrast 9.  Recurrent low-grade urothelial carcinoma: Status post TURBT x3 and total  Plan: 1.  In regards to initiating statin, I do not see a contraindication for this, LFTs are still minimally elevated but have been improving slowly.  Continue to monitor going forward. 2.  Will discuss timing of initiating antiplatelet therapy further with Dr. Henrene Pastor. 3.  Could consider diagnostic paracentesis during this hospitalization. 4.  Do not recommend low-dose nonselective beta-blocker given that patient has already had variceal bleeding so there is no need for this. 5.  Would recommend decreasing Lactulose to once daily and adding Rifaximin 550 mg twice daily.  Monitor stool out with a goal for 2-3 bowel movements per day. 5.  Will discuss adding Lasix and Spironolactone with Dr. Henrene Pastor, patient appears to be around baseline creatinine of 1.88.  Could trial low-dose in hospital and monitor for the next few days. 6.  Again patient will require repeat EGD in 4 to 6 weeks outpatient for possible  repeat banding. 7.  Patient will also benefit from further work-up of cirrhosis.  I will go ahead and make him a follow-up appointment in our clinic in the next 2 to 3 weeks.  Thank you for your kind consultation, we will likely sign off.  Lavone Nian Lemmon  01/08/2021, 10:18 AM  GI ATTENDING  Extremely complicated case.  Reviewed the clinical history, laboratories, x-rays, endoscopy report, and questions being asked regarding medications and discharge planning.  Agree with interval comprehensive progress note as outlined above.  I participated in greater than 50% of this patient's assessment, exam, and decision making in conjunction with the physician assistant.  My recommendations are as follows: 1.  No contraindication to initiating  statin.  PCP should monitor liver tests as outpatient as you would normally in a patient on statin therapy 2.  At this point, if medically beneficial, okay to initiate antiplatelet therapy.  Of course, he is at higher than baseline risk for GI bleeding.  It has been 9 days since his endoscopy.  Hemoglobin stable without evidence of bleeding. 3.  Paracentesis only if patient is uncomfortable in which case you could remove several liters.  For every liter removed provide 8 g of IV albumin. 4.  For ascites and edema okay to initiate diuretics.  Would start with Lasix 20 mg daily and Aldactone 50 mg daily.  The primary service should monitor his renal function and electrolytes.  Diuretics can be adjusted depending upon the clinical response and effects on electrolytes and renal function (if any). 5.  Agree with Xifaxan 550 mg twice daily.  Agree with decreasing lactulose as to achieve 2-3 bowel movements per day 6.  We will arrange outpatient GI follow-up in 2 to 3 weeks with Dr. Fuller Plan or one of our advanced practitioners. 7.  At the time of outpatient follow-up the decision regarding repeat EGD with possible banding, to be made. 8.  No role for beta-blocker after a variceal bleed.  The preferred treatment to prevent rebleeding is endoscopic variceal eradication. 9.  2 g sodium diet  We are available as needed for any problems or additional questions.  I hope this was helpful.  Thanks.  We will sign off.  Docia Chuck. Geri Seminole., M.D. [facility] Division of Gastroenterology

## 2021-01-08 NOTE — PMR Pre-admission (Signed)
PMR Admission Coordinator Pre-Admission Assessment  Patient: Dalton Miller is an 76 y.o., male MRN: [id number] DOB: 06/23/1945 Height:   Weight: 86.6 kg  Insurance Information HMO:     PPO:      PCP:      IPA:      80/20:      OTHER:  PRIMARY: [facility]      BULAGT#:[id number]       Subscriber: Pt CM Name: Sherlynn Carbon       Phone#: [phone]     Fax#: [phone] Pre-Cert#:       Employer:   Benefits:  Phone #:      Name:  Irene Shipper. Date: current     Deduct:       Out of Pocket Max:       Life Max:  CIR:       SNF:  Outpatient:      Co-Pay:  Home Health:       Co-Pay:  DME:      Co-Pay:  Providers:  SECONDARY:       Policy#:      Phone#:   Development worker, community: none       Phone#: none   The Actuary for patients in Inpatient Rehabilitation Facilities with attached Privacy Act [place] Records was provided and verbally reviewed with: Family  Emergency Contact Information Contact Information     Name Relation Home Work Mobile   Roussell,Dawn Daughter [phone]  [phone]   Rylan, Bernard [phone]  [phone]   [place] Son   [phone]       Current Medical History  Patient Admitting Diagnosis: CVA,  COVID, GI BLEED  History of Present Illness: Dalton Miller is a 76 y.o. male, who was admitted 12/30/20 with altered mental status after a fall at home.  He had been found unresponsive at home.  On arrival to the ER he was hypoxic and hypotensive with blood pressure 74/50 and was intubated.  CT of the head showed an acute right MCA stroke.  He has a left hemiparesis/plegia Patient had been noticing some dark stools at home over the past week or 2 and had complained of some abdominal discomfort the day before admission.NG tube was placed in the emergency room and he had immediate return of 500 cc of coffee-ground material.  Initial hemoglobin was 7.1 down from a hemoglobin of 14.9 on 12/14/2020.  He has been transfused and  most recent hemoglobin stable at 9.6, platelets 266. WBC on admission 40.7, WBC today 26.6 BUN 108/creatinine 2.47/INR 1.5. Patient COVID-positive on admission Ammonia 115. .. EGD performed 12/28 revealed esophageal varices which were banded.  Ultimately he was weaned off pressor support and extubated on 1/3, transferred to floor on hospitalist service as of 1/5. On 4 L now. COVID precautions lifted 01/09/20. CIR was consulted to assist return to PLOF.   Complete NIHSS TOTAL: 7  Patient's medical record from [facility]  has been reviewed by the rehabilitation admission coordinator and physician.  Past Medical History  Past Medical History:  Diagnosis Date   Acquired renal cyst of right kidney    Arthritis    Cancer ([place])    renal cell    Erectile dysfunction    Heart murmur    CHILDHOOD   Hematuria    left ureteral bleeding   History of cellulitis    2012 left wrist   History of kidney stones    Hyperlipidemia    Hypertension  Metastatic melanoma to lung, left ([place])    Nephrolithiasis    bilateral non-obstructive per ct 07-02-2015   Type 2 diabetes mellitus ([place])    Wears glasses     Has the patient had major surgery during 100 days prior to admission? Yes  Family History   family history includes CAD in his mother; COPD in his son; Cancer in his brother; Congestive Heart Failure in his father; Diabetes in his father and mother; Emphysema in his son; Heart attack in his brother; Heart disease in his mother and son; Stroke in his mother.  Current Medications  Current Facility-Administered Medications:    0.9 %  sodium chloride infusion (Manually program via Guardrails IV Fluids), , Intravenous, Once, Mohan, Kinila T, MD   0.9 %  sodium chloride infusion, 250 mL, Intravenous, Continuous, Ogan, Okoronkwo U, MD, Stopped at 01/02/21 0856   alteplase (CATHFLO ACTIVASE) injection 2 mg, 2 mg, Intracatheter, Once, Mannam, Praveen, MD   [START ON 01/09/2021]  amLODipine (NORVASC) tablet 10 mg, 10 mg, Oral, Daily, Vance Gather B, MD   artificial tears (LACRILUBE) ophthalmic ointment, , Both Eyes, QHS, Mannam, Praveen, MD, Given at 01/07/21 2155   Chlorhexidine Gluconate Cloth 2 % PADS 6 each, 6 each, Topical, Daily, Anders Simmonds, MD, 6 each at 01/07/21 2157   feeding supplement (GLUCERNA SHAKE) (GLUCERNA SHAKE) liquid 237 mL, 237 mL, Oral, TID BM, Freda Jackson B, MD, 237 mL at 01/08/21 1433   influenza vaccine adjuvanted (FLUAD) injection 0.5 mL, 0.5 mL, Intramuscular, Tomorrow-1000, Dewald, Jonathan B, MD   insulin aspart (novoLOG) injection 0-15 Units, 0-15 Units, Subcutaneous, TID WC, Patrecia Pour, MD, 2 Units at 01/08/21 1208   lactulose ([place]) 10 GM/15ML solution 10 g, 10 g, Oral, TID, Patrecia Pour, MD   MEDLINE mouth rinse, 15 mL, Mouth Rinse, BID, Freda Jackson B, MD, 15 mL at 01/08/21 6073   multivitamin with minerals tablet 1 tablet, 1 tablet, Oral, Daily, Freda Jackson B, MD, 1 tablet at 01/08/21 0939   pantoprazole (PROTONIX) EC tablet 40 mg, 40 mg, Oral, BID, Dang, Thuy D, RPH, 40 mg at 01/08/21 7106   protein supplement (ENSURE MAX) liquid, 11 oz, Oral, Daily, Freda Jackson B, MD, 11 oz at 01/08/21 0939   rifaximin (XIFAXAN) tablet 550 mg, 550 mg, Oral, BID, Vance Gather B, MD, 550 mg at 01/08/21 1208   sodium chloride flush (NS) 0.9 % injection 10-40 mL, 10-40 mL, Intracatheter, Q12H, Mannam, Praveen, MD, 10 mL at 01/08/21 0939   sodium chloride flush (NS) 0.9 % injection 10-40 mL, 10-40 mL, Intracatheter, PRN, Mannam, Praveen, MD  Patients Current Diet:  Diet Order             Diet Carb Modified Fluid consistency: Thin; Room service appropriate? Yes with Assist  Diet effective now                   Precautions / Restrictions Precautions Precautions: Fall Precaution Comments: on RA upon PT arrival Restrictions Weight Bearing Restrictions: No   Has the patient had 2 or more falls or a fall with injury  in the past year? No  Prior Activity Level Community (5-7x/wk): Active in the community PTA  Prior Functional Level Self Care: Did the patient need help bathing, dressing, using the toilet or eating? Independent  Indoor Mobility: Did the patient need assistance with walking from room to room (with or without device)? Independent  Stairs: Did the patient need assistance with internal or external stairs (with  or without device)? Independent  Functional Cognition: Did the patient need help planning regular tasks such as shopping or remembering to take medications? Independent  Patient Information Are you of Hispanic, Latino/a,or Spanish origin?: A. No, not of Hispanic, Latino/a, or Spanish origin What is your race?: A. White Do you need or want an interpreter to communicate with a doctor or health care staff?: 0. No  Patient's Response To:  Health Literacy and Transportation Is the patient able to respond to health literacy and transportation needs?: Yes Health Literacy - How often do you need to have someone help you when you read instructions, pamphlets, or other written material from your doctor or pharmacy?: Never In the past 12 months, has lack of transportation kept you from medical appointments or from getting medications?: No In the past 12 months, has lack of transportation kept you from meetings, work, or from getting things needed for daily living?: No  Home Assistive Devices / [place]: Cane - single point, BSC/3in1  Prior Device Use: Indicate devices/aids used by the patient prior to current illness, exacerbation or injury? None of the above  Current Functional Level Cognition  Overall Cognitive Status: Impaired/Different from baseline Current Attention Level: Sustained Orientation Level: Oriented X4 Following Commands: Follows one step commands with increased time, Follows one step commands consistently Safety/Judgement: Decreased awareness of  deficits General Comments: L inattention    Extremity Assessment (includes Sensation/Coordination)  Upper Extremity Assessment: Defer to OT evaluation RUE Deficits / Details: recent shoulder surgery due to impingment, RTC? RUE: Shoulder pain with ROM RUE Sensation: WNL RUE Coordination: decreased gross motor LUE Deficits / Details: Minimal shoulder flexion, trace muscle contraction in elbows, wrists, and fingers. LUE Sensation: decreased light touch, decreased proprioception LUE Coordination: decreased fine motor, decreased gross motor  Lower Extremity Assessment: LLE deficits/detail LLE Deficits / Details: 2+/5 DF/PF, 2/5 knee flexion/ext modified MMT in supine LLE Sensation: decreased light touch, decreased proprioception LLE Coordination: decreased fine motor, decreased gross motor    ADLs  Overall ADL's : Needs assistance/impaired Eating/Feeding: Set up, Cueing for sequencing, Cueing for compensatory techinques, Bed level Grooming: Minimal assistance, Bed level Upper Body Bathing: Maximal assistance, Bed level Lower Body Bathing: Maximal assistance, +2 for physical assistance, +2 for safety/equipment, Sitting/lateral leans, Sit to/from stand Upper Body Dressing : Maximal assistance, Bed level Lower Body Dressing: Maximal assistance, +2 for physical assistance, +2 for safety/equipment, Sitting/lateral leans, Sit to/from stand Toilet Transfer: Maximal assistance, +2 for physical assistance, +2 for safety/equipment, Stand-pivot Toileting- Clothing Manipulation and Hygiene: Maximal assistance, +2 for physical assistance, +2 for safety/equipment, Sitting/lateral lean, Sit to/from stand General ADL Comments: Due to poor balance, incoordination/weakness of LUE, pt requiring max A +2 for all ADL's, difficulty with balnce EOB, some needing to be completed with bed in chair position    Mobility  Overal bed mobility: Needs Assistance Bed Mobility: Rolling, Sidelying to Sit Rolling: Mod  assist Sidelying to sit: Mod assist Sit to sidelying: +2 for physical assistance, +2 for safety/equipment, Mod assist General bed mobility comments: cues for technique, assist to position shoulders and cues for reaching for rail, assist for L LE off bed and trunk upright    Transfers  Overall transfer level: Needs assistance Equipment used: Rolling walker (2 wheels) Transfers: Sit to/from Stand, Bed to chair/wheelchair/BSC Sit to Stand: From elevated surface, Mod assist, +2 physical assistance Bed to/from chair/wheelchair/BSC transfer type:: Step pivot Step pivot transfers: +2 physical assistance, Mod assist General transfer comment: assist for balance/lifting; stepping forward then  around with RW to recliner with step by step cues, L LE support in stance and some assist for L LE progression, assist to keep L hand on walker and for balance    Ambulation / Gait / Stairs / Wheelchair Mobility       Posture / Balance Dynamic Sitting Balance Sitting balance - Comments: cues for anterior and L lateral lean due to falling back and to R; min A overall sitting balance Balance Overall balance assessment: Needs assistance Sitting-balance support: Feet supported, Single extremity supported Sitting balance-Leahy Scale: Poor Sitting balance - Comments: cues for anterior and L lateral lean due to falling back and to R; min A overall sitting balance Postural control: Right lateral lean, Posterior lean Standing balance support: Bilateral upper extremity supported Standing balance-Leahy Scale: Poor Standing balance comment: min A with RW standing standing, mod A for balance with walker in motion    Special needs/care consideration Skin abrasion, ecchymosis to L elbow, hip, and R arm   Previous Home Environment (from acute therapy documentation) Living Arrangements: Children (lives with youngest son) Available Help at Discharge: Family, Available 24 hours/day Type of Home: Mobile home Home Layout:  One level Home Access: Ramped entrance Bathroom Shower/Tub: Chiropodist: Standard Bathroom Accessibility: Yes How Accessible: Accessible via walker  Discharge Living Setting Plans for Discharge Living Setting: Patient's home Type of Home at Discharge: Mobile home Discharge Home Layout: One level Discharge Home Access: Ramped entrance Discharge Bathroom Shower/Tub: Tub/shower unit Discharge Bathroom Toilet: Standard Discharge Bathroom Accessibility: Yes  Social/Family/Support Systems Patient Roles: Other (Comment) Contact Information: [phone] Anticipated Caregiver: Searcy Miyoshi (daughter) Sons and daughter to rotate to provide care Anticipated Caregiver's Contact Information: [phone] Ability/Limitations of Caregiver: 24/7 min a Caregiver Availability: 24/7 Discharge Plan Discussed with Primary Caregiver: Yes Is Caregiver In Agreement with Plan?: Yes Does Caregiver/Family have Issues with Lodging/Transportation while Pt is in Rehab?: No  Goals Patient/Family Goal for Rehab: PT/OT/SLP Min A Expected length of stay: 14-16 days Pt/Family Agrees to Admission and willing to participate: Yes Program Orientation Provided & Reviewed with Pt/Caregiver Including Roles  & Responsibilities: Yes  Decrease burden of Care through IP rehab admission: Specialzed equipment needs, Decrease number of caregivers, Bowel and bladder program, and Patient/family education  Possible need for SNF placement upon discharge: not anticipated   Patient Condition: I have reviewed medical records from [facility] , spoken with CM, and patient and daughter. I met with patient at the bedside for inpatient rehabilitation assessment.  Patient will benefit from ongoing PT, OT, and SLP, can actively participate in 3 hours of therapy a day 5 days of the week, and can make measurable gains during the admission.  Patient will also benefit from the coordinated team approach during  an Inpatient Acute Rehabilitation admission.  The patient will receive intensive therapy as well as Rehabilitation physician, nursing, social worker, and care management interventions.  Due to bladder management, bowel management, safety, skin/wound care, disease management, medication administration, pain management, and patient education the patient requires 24 hour a day rehabilitation nursing.  The patient is currently min-mod A with mobility and basic ADLs.  Discharge setting and therapy post discharge at home with home health is anticipated.  Patient has agreed to participate in the Acute Inpatient Rehabilitation Program and will admit today.  Preadmission Screen Completed By:  Genella Mech, 01/08/2021 3:00 PM ______________________________________________________________________   Discussed status with Dr. Naaman Plummer on 01/12/21 at 38  and received approval for admission today.  Admission Coordinator:  Genella Mech, CCC-SLP, time 1100/Date 11/12/21   Assessment/Plan: Diagnosis: CVA, covid + Does the need for close, 24 hr/day Medical supervision in concert with the patient's rehab needs make it unreasonable for this patient to be served in a less intensive setting? Yes Co-Morbidities requiring supervision/potential complications: GIB, abla, elevated ammonia level Due to bladder management, bowel management, safety, skin/wound care, disease management, medication administration, pain management, and patient education, does the patient require 24 hr/day rehab nursing? Yes Does the patient require coordinated care of a physician, rehab nurse, PT, OT, and SLP to address physical and functional deficits in the context of the above medical diagnosis(es)? Yes Addressing deficits in the following areas: balance, endurance, locomotion, strength, transferring, bowel/bladder control, bathing, dressing, feeding, grooming, toileting, cognition, and psychosocial support Can the patient actively participate in  an intensive therapy program of at least 3 hrs of therapy 5 days a week? Yes The potential for patient to make measurable gains while on inpatient rehab is excellent Anticipated functional outcomes upon discharge from inpatient rehab: min assist PT, min assist OT, min assist SLP Estimated rehab length of stay to reach the above functional goals is: 14-16 days Anticipated discharge destination: Home 10. Overall Rehab/Functional Prognosis: excellent   MD Signature: Meredith Staggers, MD, [place] Director Rehabilitation Services 01/12/2021

## 2021-01-08 NOTE — Progress Notes (Signed)
PROGRESS NOTE  Dalton Miller  OEU:[id number] DOB: July 13, 1945 DOA: 12/29/2020 PCP: [facility], [facility]   Brief Narrative: Dalton Miller is a 76 y.o. male with a history of metastatic melanoma, lung CA, urothelial CA, tobacco use, thrombocytopenia, right shoulder OA and impingement s/p surgery 12/17/20 who presented to the [facility] 12/27 found down, unresponsive by the toilet at home (unknown duration, LKW previous evening when he complained of upper abd pain and black stool). He required intubation in the ED, noted to have acute right MCA CVA on CT. OGT placed with >500cc coffee ground emesis, hgb 7.1g/dl. LFTs elevated. +SARS-CoV-2 PCR also noted on admission to ICU at [facility]. EGD performed 12/28 revealed esophageal varices which were banded. He received 1u PRBCs on 1/1. Ultimately he was weaned off pressor support and extubated on 1/3, transferred to floor on hospitalist service as of 1/5.   Assessment & Plan: Principal Problem:   Acute respiratory failure with hypoxia (HCC) Active Problems:   CVA (cerebral vascular accident) ()   Gastrointestinal hemorrhage   Hyperammonemia (HCC)   Toxic metabolic encephalopathy   Jaundice   Abnormal LFTs   AKI (acute kidney injury) ([place])   Hypotension   COVID-19 virus infection   Leukocytosis   Thrombocytosis   Severe sepsis (HCC)   Hematemesis with nausea   Melena  Acute blood loss anemia due to upper GI bleeding due to esophageal varices: s/p banding x4 12/28, 1u PRBCs 1/1.  - Hgb recheck today and is stable.  - Continue PPI BID, completed octreotide.  - Will need repeat EGD in 4-6 weeks with banding (initial EGD by Dr. Fuller Plan). - Not initiating nonselective BB due to 1st degree heart block with rate in 60's. If improves, may consider low dose.   Hepatic cirrhosis: By morphologic appearance on CT abd/pelvis. Also with ascites and changes consistent with portal HTN. - LFTs have improved steadily, likely acutely worsened from sepsis/covid-19.  Hyperbilirubinemia still noted. - Needs GI follow up as outpatient.  - Having ascites, pitting edema, would benefit from lasix/spiro, though renal function not back to baseline or seeming to have reached nadir. Will defer timing of diuretic initiation to GI. No current evidence of SBP.  Acute right MCA CVA affecting basal ganglia, right frontal lobe. Scattered cortical and subcortical acute infarcts in the right MCA distribution with mild petechial hemorrhage. MRA revealed 70% stenosis at the right M1 segment. - PT/OT - Stroke neurology signed off 12/30, recommending restarting statin (LDL 71) once LFTs improve and antiplatelet once bleeding subsided. Will need neurology follow up in 6-8 weeks.   Acute hypoxic respiratory failure: Suspect this is due to aspiration pneumonia during admission that was only later seen on subsequent CT at lung bases.  - s/p abx - Weaned from oxygen   Acute hepatic/metabolic encephalopathy: Multifactorial.  - Continue delirium precautions - Continue lactulose with hyperammonemia. Pt is completely oriented and responsive appropriately, having diarrhea requiring rectal tube still with lactulose > decrease lactulose. Will add rifaximin.   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 ?if related to liver disease. Oddly, had thrombocytosis on presentation here and platelets have remained normal. - Continue monitoring, not continuing promacta 25mg  daily.   Covid-19 infection: Appears incidental. Was not able to take paxlovid or mulnupiravir due to LFT derangements or paxlovid due to NPO/OGT. Outside window of potential benefit and never needed steroids.  - Continue isolation x10 days from positive test (12/27).   Severe sepsis with lactic  acidosis: Resolved. Source remains unclear. Completed ceftriaxone x7 days as of 1/2.   AKI, solitary kidney s/p nephrectomy due to RCC: Previous baseline Cr appears to  be ~1.0.  - SCr continues improving, unclear where CrCl will settle. Will continue monitoring intermittently to minimize blood draws.   Moderate protein calorie malnutrition:  - Encourage enteral intake, has removed tube.   Hypernatremia: Improved. Anticipate continued improvement since he's clinically stabilizing.   T2DM: HbA1c was 7.6% on 12/14/2020 (PTA), 6.2% during this admission (before transfusion) and has been <6.5% on multiple checks since 2016.  - PCP follow up recommended. Continue CBGs, change to AC/HS to ensure tight glycemic control.   HTN:  - Holding lisinopril with AKI.  - Initial permissive HTN, started norvasc 5mg  1/5, likely will titrate upward.  Metastatic recurrent melanoma: LUL lesion, mediastinal adenopathy. s/p 4 cycles ipi/nivo.  - Outpatient follow up to continue - Note 2 new liver lesions on CT on Jan 03, 2021 > recommend further evaluation as outpatient with dedicated multiphase abdominal MRI w/contrast.  Recurrent low grade urothelial carcinoma: s/p TURBT x3 in total.  - Continue outpatient follow up. Continue tamsulosin  DVT prophylaxis: SCDs Code Status: Full Family Communication: None at bedside Disposition Plan:  Status is: Inpatient  Remains inpatient appropriate because: Needs PT/OT evaluations, pushing oral intake.   Consultants:  [facility]  Procedures:  EGD 12/28 Dr. Fuller Plan:  Impression:               - Grade II esophageal varices. Completely                            eradicated. Banded.                           - Red blood clots in the gastric fundus.                           - Normal duodenal bulb and second portion of the                            duodenum.                           - No specimens collected.  Antimicrobials: Ceftriaxone x7 days   Subjective: Eating well, oriented to situation, place, and month/year. He denies abd pain. No N/V. He's still having diarrhea from the lactulose but isn't bothered by it with  rectal tube. No cough or dyspnea.   Objective: Vitals:   01/07/21 1727 01/07/21 2343 01/08/21 0307 01/08/21 0742  BP: (!) 155/78 (!) 138/100 (!) 149/79 (!) 162/76  Pulse: 90 81 69 67  Resp: 19 20 20 20   Temp: 98.2 F (36.8 C) 98.7 F (37.1 C) 98.9 F (37.2 C) 97.6 F (36.4 C)  TempSrc:  Oral Oral Oral  SpO2: 94% 98% 96% 100%  Weight:        Intake/Output Summary (Last 24 hours) at 01/08/2021 0936 Last data filed at 01/08/2021 0308 Gross per 24 hour  Intake --  Output 1900 ml  Net -1900 ml   Filed Weights   12/31/20 0500 01/01/21 0500 01/05/21 0500  Weight: 77 kg 86.4 kg 86.6 kg   Gen: 76 y.o. male in no distress Pulm: Nonlabored breathing room air. Clear. CV:  Regular rate and rhythm. No murmur, rub, or gallop. No JVD, 1+ pitting dependent edema. GI: Abdomen nontender but distended, distant BS. Ext: Warm, no deformities Skin: No new rashes, lesions or ulcers on visualized skin. PICC site c/d/I LUA. Neuro: Alert and oriented though has bilateral ptosis and appears drowsy at time he is very interactive and conversant.  Psych: Judgement and insight appear intact. Mood euthymic & affect congruent. Behavior is appropriate.    Data Reviewed: I have personally reviewed following labs and imaging studies  CBC: Recent Labs  Lab 01/03/21 0351 01/04/21 0418 01/05/21 0511 01/07/21 1234 01/08/21 0500  WBC 11.2* 10.8* 10.1 12.2* 12.0*  HGB 6.8* 8.2* 8.0* 9.9* 9.5*  HCT 21.6* 25.5* 25.8* 31.4* 30.8*  MCV 101.4* 97.3 102.4* 102.3* 102.3*  PLT 256 245 229 233 831   Basic Metabolic Panel: Recent Labs  Lab 01/04/21 0418 01/04/21 1955 01/05/21 0511 01/05/21 0703 01/05/21 1616 01/06/21 0600 01/07/21 1234 01/08/21 0500  NA 147*  --  148* 149*  --  146* 145 146*  K 3.6  --  3.4* 3.3*  --  3.7 4.1 3.7  CL 115*  --  119* 121*  --  120* 118* 119*  CO2 18*  --  18* 20*  --  20* 20* 19*  GLUCOSE 393*  --  516* 199*  --  180* 218* 147*  BUN 83*  --  67* 73*  --  60* 49* 42*   CREATININE 3.40*  --  2.85* 3.03*  --  2.47*   2.55* 2.08* 1.88*  CALCIUM 7.5*  --  7.4* 7.3*  --  7.7* 8.0* 7.9*  MG 1.8 1.7 1.7  --  2.6* 2.1  --   --   PHOS 3.5 3.5 3.7  --  4.0 4.0  --   --    GFR: Estimated Creatinine Clearance: 36.2 mL/min (A) (by C-G formula based on SCr of 1.88 mg/dL (H)). Liver Function Tests: Recent Labs  Lab 01/02/21 0407 01/03/21 0351 01/04/21 0418 01/07/21 1234  AST 108* 64* 44* 30  ALT 231* 165* 121* 64*  ALKPHOS 91 107 108 151*  BILITOT 2.3* 2.0* 2.2* 1.4*  PROT 4.3* 4.3* 4.3* 5.0*  ALBUMIN 1.7* 1.7* 1.7* 2.1*   No results for input(s): LIPASE, AMYLASE in the last 168 hours. No results for input(s): AMMONIA in the last 168 hours. Coagulation Profile: No results for input(s): INR, PROTIME in the last 168 hours.  Cardiac Enzymes: No results for input(s): CKTOTAL, CKMB, CKMBINDEX, TROPONINI in the last 168 hours. BNP (last 3 results) No results for input(s): PROBNP in the last 8760 hours. HbA1C: No results for input(s): HGBA1C in the last 72 hours. CBG: Recent Labs  Lab 01/07/21 0842 01/07/21 1232 01/07/21 1842 01/07/21 2135 01/08/21 0618  GLUCAP 122* 185* 162* 171* 145*   Lipid Profile: No results for input(s): CHOL, HDL, LDLCALC, TRIG, CHOLHDL, LDLDIRECT in the last 72 hours. Thyroid Function Tests: No results for input(s): TSH, T4TOTAL, FREET4, T3FREE, THYROIDAB in the last 72 hours. Anemia Panel: No results for input(s): VITAMINB12, FOLATE, FERRITIN, TIBC, IRON, RETICCTPCT in the last 72 hours. Urine analysis:    Component Value Date/Time   COLORURINE YELLOW 12/29/2020 1012   APPEARANCEUR CLEAR 12/29/2020 1012   LABSPEC 1.015 12/29/2020 1012   PHURINE 5.5 12/29/2020 1012   GLUCOSEU 100 (A) 12/29/2020 1012   HGBUR NEGATIVE 12/29/2020 1012   BILIRUBINUR NEGATIVE 12/29/2020 1012   BILIRUBINUR negative 02/17/2015 [place] 12/29/2020 1012   PROTEINUR  NEGATIVE 12/29/2020 1012   UROBILINOGEN negative 02/17/2015  1336   UROBILINOGEN 0.2 01/27/2014 2028   NITRITE NEGATIVE 12/29/2020 1012   LEUKOCYTESUR NEGATIVE 12/29/2020 1012   Recent Results (from the past 240 hour(s))  Urine Culture     Status: None   Collection Time: 12/29/20 10:13 AM   Specimen: Urine, Catheterized  Result Value Ref Range Status   Specimen Description   Final    URINE, CATHETERIZED Performed at [facility], [address], [place], [place] [address]    Special Requests   Final    NONE Performed at [facility], [address], [place], [place] [address]    Culture   Final    NO GROWTH Performed at [facility], [place] [address] [place], [place] [address]    Report Status 12/31/2020 FINAL  Final  Culture, blood (routine x 2)     Status: None   Collection Time: 12/29/20 11:01 AM   Specimen: BLOOD RIGHT HAND  Result Value Ref Range Status   Specimen Description BLOOD RIGHT HAND  Final   Special Requests   Final    BOTTLES DRAWN AEROBIC AND ANAEROBIC Blood Culture adequate volume   Culture   Final    NO GROWTH 5 DAYS Performed at [facility] [address] [place], [place] [address]    Report Status 01/03/2021 FINAL  Final  Culture, blood (routine x 2)     Status: None   Collection Time: 12/29/20 11:01 AM   Specimen: BLOOD LEFT HAND  Result Value Ref Range Status   Specimen Description BLOOD LEFT HAND  Final   Special Requests   Final    BOTTLES DRAWN AEROBIC AND ANAEROBIC Blood Culture results may not be optimal due to an inadequate volume of blood received in culture bottles   Culture   Final    NO GROWTH 5 DAYS Performed at [facility], [address], [place], [place] [address]    Report Status 01/03/2021 FINAL  Final  MRSA Next Gen by PCR, Nasal     Status: None   Collection Time: 12/29/20 10:31 PM   Specimen: Nasal Mucosa; Nasal Swab  Result Value Ref Range Status   MRSA by PCR Next Gen NOT DETECTED NOT DETECTED Final    Comment: (NOTE) The GeneXpert MRSA Assay (FDA approved for NASAL specimens  only), is one component of a comprehensive MRSA colonization surveillance program. It is not intended to diagnose MRSA infection nor to guide or monitor treatment for MRSA infections. Test performance is not FDA approved in patients less than 75 years old. Performed at [facility], [place] [address] [place], [place] [address]       Radiology Studies: No results found.  Scheduled Meds:  sodium chloride   Intravenous Once   alteplase  2 mg Intracatheter Once   amLODipine  5 mg Oral Daily   artificial tears   Both Eyes QHS   Chlorhexidine Gluconate Cloth  6 each Topical Daily   feeding supplement (GLUCERNA SHAKE)  237 mL Oral TID BM   influenza vaccine adjuvanted  0.5 mL Intramuscular Tomorrow-1000   insulin aspart  0-15 Units Subcutaneous TID WC   lactulose  20 g Oral TID   mouth rinse  15 mL Mouth Rinse BID   multivitamin with minerals  1 tablet Oral Daily   pantoprazole  40 mg Oral BID   Ensure Max Protein  11 oz Oral Daily   sodium chloride flush  10-40 mL Intracatheter Q12H  tamsulosin  0.4 mg Oral Daily   Continuous Infusions:  sodium chloride Stopped (01/02/21 0856)     LOS: 10 days    Patrecia Pour, MD [facility] [email or web address] 01/08/2021, 9:36 AM

## 2021-01-08 NOTE — Progress Notes (Signed)
Physical Therapy Treatment Patient Details Name: Dalton Miller MRN: [id number] DOB: 1945-02-11 Today's Date: 01/08/2021   History of Present Illness 76 yo male presents to ED on 12/27 with unresponsiveness, hypoxia. Pt with recent shoulder surgery. ETT 12/27-1/3; CTH and MRI show acute R MCA CVA. Pt also with heme + stools, covid +. PMH includes OA, renal cell cancer s/p nephrectomy, melanoma with lung mets and urothelial cancer, cellulitis, HLD, HTN, DMII, COPD, R TKA,    PT Comments    Patient progressing with mobility able to stand and step with RW and 2 person assist with L LE supporting for R step with some external support as well.  Patient fatigued with mobility and demonstrated increased RR though SpO2 maintained above 90 while on RA.  Family in the room and supportive.  Continue to feel he is appropriate for acute inpatient rehab at d/c.  PT will continue to follow acutely.  Recommendations for follow up therapy are one component of a multi-disciplinary discharge planning process, led by the attending physician.  Recommendations may be updated based on patient status, additional functional criteria and insurance authorization.  Follow Up Recommendations  Acute inpatient rehab (3hours/day)     Assistance Recommended at Discharge Frequent or constant Supervision/Assistance  Patient can return home with the following A lot of help with bathing/dressing/bathroom;Assistance with feeding;Assist for transportation;Two people to help with walking and/or transfers;Help with stairs or ramp for entrance   Equipment Recommendations  Other (comment) (TBA)    Recommendations for Other Services       Precautions / Restrictions Precautions Precautions: Fall     Mobility  Bed Mobility Overal bed mobility: Needs Assistance Bed Mobility: Rolling;Sidelying to Sit Rolling: Mod assist Sidelying to sit: Mod assist       General bed mobility comments: cues for technique, assist to position  shoulders and cues for reaching for rail, assist for L LE off bed and trunk upright    Transfers Overall transfer level: Needs assistance Equipment used: Rolling walker (2 wheels) Transfers: Sit to/from Stand;Bed to chair/wheelchair/BSC Sit to Stand: From elevated surface;Mod assist;+2 physical assistance     Step pivot transfers: +2 physical assistance;Mod assist     General transfer comment: assist for balance/lifting; stepping forward then around with RW to recliner with step by step cues, L LE support in stance and some assist for L LE progression, assist to keep L hand on walker and for balance    Ambulation/Gait                   Stairs             Wheelchair Mobility    Modified Rankin (Stroke Patients Only) Modified Rankin (Stroke Patients Only) Pre-Morbid Rankin Score: No significant disability Modified Rankin: Severe disability     Balance Overall balance assessment: Needs assistance Sitting-balance support: Feet supported;Single extremity supported Sitting balance-Leahy Scale: Poor Sitting balance - Comments: cues for anterior and L lateral lean due to falling back and to R; min A overall sitting balance Postural control: Right lateral lean;Posterior lean Standing balance support: Bilateral upper extremity supported Standing balance-Leahy Scale: Poor Standing balance comment: min A with RW standing standing, mod A for balance with walker in motion                            Cognition Arousal/Alertness: Awake/alert Behavior During Therapy: Flat affect Overall Cognitive Status: Impaired/Different from baseline Area of Impairment: Problem solving;Following  commands;Attention;Safety/judgement                   Current Attention Level: Sustained   Following Commands: Follows one step commands with increased time;Follows one step commands consistently Safety/Judgement: Decreased awareness of deficits   Problem Solving: Slow  processing;Decreased initiation;Difficulty sequencing;Requires verbal cues General Comments: L inattention        Exercises      General Comments General comments (skin integrity, edema, etc.): on RA throughout HR max 115, SpO2 throughout 91 or above, increased RR with  mobility      Pertinent Vitals/Pain Pain Assessment: No/denies pain    Home Living                          Prior Function            PT Goals (current goals can now be found in the care plan section) Progress towards PT goals: Progressing toward goals    Frequency    Min 4X/week      PT Plan Current plan remains appropriate    Co-evaluation              AM-PAC PT "6 Clicks" Mobility   Outcome Measure  Help needed turning from your back to your side while in a flat bed without using bedrails?: A Lot Help needed moving from lying on your back to sitting on the side of a flat bed without using bedrails?: A Lot Help needed moving to and from a bed to a chair (including a wheelchair)?: Total Help needed standing up from a chair using your arms (e.g., wheelchair or bedside chair)?: Total Help needed to walk in hospital room?: Total Help needed climbing 3-5 steps with a railing? : Total 6 Click Score: 8    End of Session Equipment Utilized During Treatment: Gait belt Activity Tolerance: Patient tolerated treatment well Patient left: with call bell/phone within reach;in chair;with family/visitor present;with chair alarm set   PT Visit Diagnosis: Other abnormalities of gait and mobility (R26.89);Difficulty in walking, not elsewhere classified (R26.2);Hemiplegia and hemiparesis Hemiplegia - Right/Left: Left Hemiplegia - dominant/non-dominant: Non-dominant Hemiplegia - caused by: Cerebral infarction     Time: 1217-1242 PT Time Calculation (min) (ACUTE ONLY): 25 min  Charges:  $Therapeutic Activity: 23-37 mins                     Magda Kiel, PT Acute Rehabilitation  Services GOTLX:[phone] [phone] 01/08/2021    Dalton Miller 01/08/2021, 2:18 PM

## 2021-01-08 NOTE — Progress Notes (Signed)
Inpatient Rehab Admissions Coordinator:   I spoke with Pt's daughter regarding potential CIR admit. She is interested and confirms family can provide 24/7 support. I will open a case with Pt.'s insurance and pursue for admission.  Clemens Catholic, [place], [place] Admissions Coordinator  [phone] ([place]) [phone] (office)

## 2021-01-08 NOTE — Progress Notes (Signed)
RN requested RT to assess due to SOB. Upon entry pt on [facility] sat 98%, pt states he is feeling much better, BS are clear and bilateral. No resp distress noted at this time. Will continue to monitor.

## 2021-01-09 LAB — GLUCOSE, CAPILLARY
Glucose-Capillary: 160 mg/dL — ABNORMAL HIGH (ref 70–99)
Glucose-Capillary: 161 mg/dL — ABNORMAL HIGH (ref 70–99)
Glucose-Capillary: 140 mg/dL — ABNORMAL HIGH (ref 70–99)
Glucose-Capillary: 134 mg/dL — ABNORMAL HIGH (ref 70–99)

## 2021-01-09 MED ORDER — LACTULOSE 10 GM/15ML PO SOLN
10.0000 g | Freq: Every day | ORAL | Status: DC
  Administered 2021-01-09 – 2021-01-12 (×4): 10 g via ORAL
  Filled 2021-01-09 (×4): qty 30

## 2021-01-09 MED ORDER — ATORVASTATIN CALCIUM 10 MG PO TABS
10.0000 mg | ORAL_TABLET | Freq: Every day | ORAL | Status: DC
  Administered 2021-01-09 – 2021-01-12 (×4): 10 mg via ORAL
  Filled 2021-01-09 (×4): qty 1

## 2021-01-09 MED ORDER — GERHARDT'S BUTT CREAM
TOPICAL_CREAM | Freq: Three times a day (TID) | CUTANEOUS | Status: DC
  Filled 2021-01-09: qty 1

## 2021-01-09 MED ORDER — ASPIRIN EC 81 MG PO TBEC
81.0000 mg | DELAYED_RELEASE_TABLET | Freq: Every day | ORAL | Status: DC
  Administered 2021-01-09 – 2021-01-12 (×4): 81 mg via ORAL
  Filled 2021-01-09 (×4): qty 1

## 2021-01-09 NOTE — Progress Notes (Signed)
PROGRESS NOTE  Dalton Miller  IRS:[id number] DOB: Sep 09, 1945 DOA: 12/29/2020 PCP: [facility], [facility]   Brief Narrative: Dalton Miller is a 76 y.o. male with a history of metastatic melanoma, lung CA, urothelial CA, tobacco use, thrombocytopenia, right shoulder OA and impingement s/p surgery 12/17/20 who presented to the [facility] 12/27 found down, unresponsive by the toilet at home (unknown duration, LKW previous evening when he complained of upper abd pain and black stool). He required intubation in the ED, noted to have acute right MCA CVA on CT. OGT placed with >500cc coffee ground emesis, hgb 7.1g/dl. LFTs elevated. +SARS-CoV-2 PCR also noted on admission to ICU at [facility]. EGD performed 12/28 revealed esophageal varices which were banded. He received 1u PRBCs on 1/1. Ultimately he was weaned off pressor support and extubated on 1/3, transferred to floor on hospitalist service as of 1/5. GI reengaged, cleared for antiplatelet and statin initiation, among other recommendations, and have scheduled follow up as an outpatient on 1/31. Lactulose dose is being decreased with improved diarrhea, rifaximin added. The plan remains to pursue CIR at discharge.    Assessment & Plan: Principal Problem:   Acute respiratory failure with hypoxia (HCC) Active Problems:   CVA (cerebral vascular accident) ([place])   Gastrointestinal hemorrhage   Hyperammonemia (HCC)   Toxic metabolic encephalopathy   Jaundice   Abnormal LFTs   AKI (acute kidney injury) ([place])   Hypotension   COVID-19 virus infection   Leukocytosis   Thrombocytosis   Severe sepsis (HCC)   Hematemesis with nausea   Melena   Idiopathic esophageal varices with bleeding (HCC)   Alcoholic cirrhosis of liver with ascites (HCC)   Encephalopathy, hepatic  Acute blood loss anemia due to upper GI bleeding due to esophageal varices: s/p banding x4 12/28, 1u PRBCs 1/1.  - Monitor CBC with initiation of aspirin. No clinical evidence of bleeding since  endoscopy. - Continue PPI BID, completed octreotide.  - Follow up in place for 1/31 with Dr. Fuller Plan, with consideration of repeat EGD with possible banding.   Hepatic cirrhosis: By morphologic appearance on CT abd/pelvis. Also with ascites and changes consistent with portal HTN. - LFTs have improved steadily, likely acutely worsened from sepsis/covid-19. At this level, no contraindication to initiating statin therapy, so starting low dose atorvastatin with plans to increase as tolerated (by LFTs and clinically).  - Having ascites, pitting edema, would benefit from lasix/spiro, though renal function not back to baseline or seeming to have reached nadir. Will likely start lasix 20mg , spironolactone 50mg  once renal function optimized. No current evidence of SBP/discomfort, so not pursuing paracentesis. - 2g sodium restriction  Acute right MCA CVA affecting basal ganglia, right frontal lobe. Scattered cortical and subcortical acute infarcts in the right MCA distribution with mild petechial hemorrhage. MRA revealed 70% stenosis at the right M1 segment. - PT/OT - Stroke neurology signed off 12/30, recommending restarting statin (LDL 71) once LFTs improve and antiplatelet once bleeding subsided. Aspirin monotherapy initiated 1/7, 10 days after endoscopy.  - Will need neurology follow up in 6-8 weeks.   Acute hypoxic respiratory failure: Suspect this is due to aspiration pneumonia during admission that was only later seen on subsequent CT at lung bases.  - s/p abx - Weaned from oxygen, no hypoxia with ambulation.  Acute hepatic/metabolic encephalopathy: Multifactorial.  - Continue delirium precautions - Decrease lactulose in effort to remove rectal tube to facilitate rehabilitation. Added rifaximin 550mg  BID.    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 ?if related to liver disease. Oddly, had thrombocytosis on presentation here and  platelets have remained normal. - Continue monitoring, not continuing promacta 25mg  daily.   Covid-19 infection: Appears incidental. Was not able to take paxlovid or mulnupiravir due to LFT derangements or paxlovid due to NPO/OGT. Outside window of potential benefit and never needed steroids.  - Continue isolation x10 days from positive test (12/27).   Severe sepsis with lactic acidosis: Resolved. Source remains unclear. Completed ceftriaxone x7 days as of 1/2.   AKI, solitary kidney s/p nephrectomy due to RCC: Previous baseline Cr appears to be ~1.0.  - SCr continues improving, unclear where CrCl will settle. Will continue monitoring intermittently to minimize blood draws.   Moderate protein calorie malnutrition:  - Encourage enteral intake, has removed tube.   Hypernatremia: Improved. Anticipate continued improvement since he's clinically stabilizing.   T2DM: HbA1c was 7.6% on 12/14/2020 (PTA), 6.2% during this admission (before transfusion) and has been <6.5% on multiple checks since 2016.  - PCP follow up recommended. Continue CBGs AC/HS to ensure tight glycemic control.   HTN:  - Holding lisinopril with AKI.  - Initial permissive HTN, started norvasc 5mg  > 10mg   Metastatic recurrent melanoma: LUL lesion, mediastinal adenopathy. s/p 4 cycles ipi/nivo.  - Outpatient follow up to continue - Note 2 new liver lesions on CT on Jan 03, 2021 > recommend further evaluation as outpatient with dedicated multiphase abdominal MRI w/contrast.  Recurrent low grade urothelial carcinoma: s/p TURBT x3 in total.  - Continue outpatient follow up. Continue tamsulosin  DVT prophylaxis: SCDs Code Status: Full Family Communication: Son and DIL at bedside Disposition Plan:  Status is: Inpatient  Remains inpatient appropriate because: Needs decrease in diarrhea with stable mentation to remove rectal tube. Possibly DC to CIR 1/9.  Consultants:  PCCM [facility]  Procedures:  EGD 12/28 Dr. Fuller Plan:   Impression:        - Grade II esophageal varices. Completely                            eradicated. Banded.                           - Red blood clots in the gastric fundus.                           - Normal duodenal bulb and second portion of the                            duodenum.                           - No specimens collected.  Antimicrobials: Ceftriaxone x7 days   Subjective: No complaints, he's getting his sense of humor back while family is in room this morning. Says he feels fine. Eager to get rectal tube out and rehabilitate. No abdominal pain, N/V.  Objective: Vitals:   01/09/21 0457 01/09/21 0813 01/09/21 0914 01/09/21 1232  BP:  129/64 139/74 (!) 152/76  Pulse:  79 82 98  Resp:  (!) 23 (!) 22 20  Temp:  98.9 F (37.2 C)  98.2 F (36.8 C)  TempSrc:  Oral  Oral  SpO2:  99% 98% 97%  Weight: 83.9 kg  Intake/Output Summary (Last 24 hours) at 01/09/2021 1324 Last data filed at 01/09/2021 8099 Gross per 24 hour  Intake 30 ml  Output 1050 ml  Net -1020 ml   Filed Weights   01/01/21 0500 01/05/21 0500 01/09/21 0457  Weight: 86.4 kg 86.6 kg 83.9 kg   Gen: 76 y.o. male in no distress Pulm: Nonlabored breathing room air. Clear. CV: Regular rate and rhythm. No murmur, rub, or gallop. No JVD, 1+ pitting dependent edema. GI: Abdomen distended moderately without tenderness, +BS.  Ext: Warm, no deformities Skin: No new rashes, lesions or ulcers on visualized skin. [place] c/d/I. Neuro: Alert and oriented. No focal neurological deficits. Psych: Judgement and insight appear fair. Mood euthymic & affect congruent. Behavior is appropriate.    Data Reviewed: I have personally reviewed following labs and imaging studies  CBC: Recent Labs  Lab 01/03/21 0351 01/04/21 0418 01/05/21 0511 01/07/21 1234 01/08/21 0500  WBC 11.2* 10.8* 10.1 12.2* 12.0*  HGB 6.8* 8.2* 8.0* 9.9* 9.5*  HCT 21.6* 25.5* 25.8* 31.4* 30.8*  MCV 101.4* 97.3 102.4* 102.3* 102.3*  PLT 256  245 229 233 833   Basic Metabolic Panel: Recent Labs  Lab 01/04/21 0418 01/04/21 1955 01/05/21 0511 01/05/21 0703 01/05/21 1616 01/06/21 0600 01/07/21 1234 01/08/21 0500  NA 147*  --  148* 149*  --  146* 145 146*  K 3.6  --  3.4* 3.3*  --  3.7 4.1 3.7  CL 115*  --  119* 121*  --  120* 118* 119*  CO2 18*  --  18* 20*  --  20* 20* 19*  GLUCOSE 393*  --  516* 199*  --  180* 218* 147*  BUN 83*  --  67* 73*  --  60* 49* 42*  CREATININE 3.40*  --  2.85* 3.03*  --  2.47*   2.55* 2.08* 1.88*  CALCIUM 7.5*  --  7.4* 7.3*  --  7.7* 8.0* 7.9*  MG 1.8 1.7 1.7  --  2.6* 2.1  --   --   PHOS 3.5 3.5 3.7  --  4.0 4.0  --   --    GFR: Estimated Creatinine Clearance: 35.7 mL/min (A) (by C-G formula based on SCr of 1.88 mg/dL (H)). Liver Function Tests: Recent Labs  Lab 01/03/21 0351 01/04/21 0418 01/07/21 1234  AST 64* 44* 30  ALT 165* 121* 64*  ALKPHOS 107 108 151*  BILITOT 2.0* 2.2* 1.4*  PROT 4.3* 4.3* 5.0*  ALBUMIN 1.7* 1.7* 2.1*   No results for input(s): LIPASE, AMYLASE in the last 168 hours. No results for input(s): AMMONIA in the last 168 hours. Coagulation Profile: No results for input(s): INR, PROTIME in the last 168 hours.  Cardiac Enzymes: No results for input(s): CKTOTAL, CKMB, CKMBINDEX, TROPONINI in the last 168 hours. BNP (last 3 results) No results for input(s): PROBNP in the last 8760 hours. HbA1C: No results for input(s): HGBA1C in the last 72 hours. CBG: Recent Labs  Lab 01/08/21 1110 01/08/21 1612 01/08/21 2152 01/09/21 0552 01/09/21 1235  GLUCAP 134* 210* 211* 160* 161*   Lipid Profile: No results for input(s): CHOL, HDL, LDLCALC, TRIG, CHOLHDL, LDLDIRECT in the last 72 hours. Thyroid Function Tests: No results for input(s): TSH, T4TOTAL, FREET4, T3FREE, THYROIDAB in the last 72 hours. Anemia Panel: No results for input(s): VITAMINB12, FOLATE, FERRITIN, TIBC, IRON, RETICCTPCT in the last 72 hours. Urine analysis:    Component Value Date/Time    COLORURINE YELLOW 12/29/2020 [place] 12/29/2020  1012   LABSPEC 1.015 12/29/2020 1012   PHURINE 5.5 12/29/2020 1012   GLUCOSEU 100 (A) 12/29/2020 1012   HGBUR NEGATIVE 12/29/2020 [place] 12/29/2020 1012   BILIRUBINUR negative 02/17/2015 1336   KETONESUR NEGATIVE 12/29/2020 1012   PROTEINUR NEGATIVE 12/29/2020 1012   UROBILINOGEN negative 02/17/2015 1336   UROBILINOGEN 0.2 01/27/2014 2028   NITRITE NEGATIVE 12/29/2020 1012   LEUKOCYTESUR NEGATIVE 12/29/2020 1012   No results found for this or any previous visit (from the past 240 hour(s)).     Radiology Studies: No results found.  Scheduled Meds:  sodium chloride   Intravenous Once   alteplase  2 mg Intracatheter Once   amLODipine  10 mg Oral Daily   artificial tears   Both Eyes QHS   aspirin EC  81 mg Oral Daily   atorvastatin  10 mg Oral Daily   Chlorhexidine Gluconate Cloth  6 each Topical Daily   feeding supplement (GLUCERNA SHAKE)  237 mL Oral TID BM   influenza vaccine adjuvanted  0.5 mL Intramuscular Tomorrow-1000   insulin aspart  0-15 Units Subcutaneous TID WC   lactulose  10 g Oral Daily   mouth rinse  15 mL Mouth Rinse BID   multivitamin with minerals  1 tablet Oral Daily   pantoprazole  40 mg Oral BID   Ensure Max Protein  11 oz Oral Daily   rifaximin  550 mg Oral BID   sodium chloride flush  10-40 mL Intracatheter Q12H   Continuous Infusions:  sodium chloride Stopped (01/02/21 0856)     LOS: 11 days    Patrecia Pour, MD [facility] [email or web address] 01/09/2021, 1:24 PM

## 2021-01-09 NOTE — Progress Notes (Signed)
Inpatient Rehab Admissions Coordinator:   I received notice from the [place] that they are not denying CIR request but do not feel Pt. Is ready. Request updated therapy and MD notes indicating medical stability on Monday.   Clemens Catholic, [place], [place] Admissions Coordinator  [phone] ([place]) [phone] (office)

## 2021-01-09 NOTE — Plan of Care (Signed)
Patient alert, oriented today. Patient able to tolerate oral intake today. Patient able to get out of bed to chair with 2x staff assistance and back to bed with staff assist. Family visited today and updated by Attending Provider Dr. Bonner Puna. Patient stool remains liquid, flexiseal rectal tube still in place. Bed in lowest position, call bell within reach, and bed alarm on.   Problem: Education: Goal: Knowledge of disease or condition will improve Outcome: Progressing   Problem: Education: Goal: Knowledge of disease or condition will improve Outcome: Progressing   Problem: Ischemic Stroke/TIA Tissue Perfusion: Goal: Complications of ischemic stroke/TIA will be minimized Outcome: Progressing   Problem: Education: Goal: Knowledge of General Education information will improve Description: Including pain rating scale, medication(s)/side effects and non-pharmacologic comfort measures Outcome: Progressing   Problem: Activity: Goal: Risk for activity intolerance will decrease Outcome: Progressing   Problem: Nutrition: Goal: Adequate nutrition will be maintained Outcome: Progressing

## 2021-01-09 NOTE — Progress Notes (Signed)
Physical Therapy Treatment Patient Details Name: Dalton Miller MRN: [id number] DOB: [birth year] Today's Date: 01/09/2021   History of Present Illness 76 yo male presents to ED on 12/27 with unresponsiveness, hypoxia. Pt with recent shoulder surgery. ETT 12/27-1/3; CTH and MRI show acute R MCA CVA. Pt also with heme + stools, covid +. PMH includes OA, renal cell cancer s/p nephrectomy, melanoma with lung mets and urothelial cancer, cellulitis, HLD, HTN, DMII, COPD, R TKA,    PT Comments    Pt making progress based on chart review of previous notes in regards to cognition and mobility. Pt would likely make an excellent candidate for AIR as he still has significant deficits.    Per RN pt is now off COVID precautions.     Recommendations for follow up therapy are one component of a multi-disciplinary discharge planning process, led by the attending physician.  Recommendations may be updated based on patient status, additional functional criteria and insurance authorization.  Follow Up Recommendations  Acute inpatient rehab (3hours/day)     Assistance Recommended at Discharge Frequent or constant Supervision/Assistance  Patient can return home with the following A lot of help with bathing/dressing/bathroom;Assistance with feeding;Assist for transportation;Help with stairs or ramp for entrance;A lot of help with walking and/or transfers   Equipment Recommendations  Wheelchair (measurements PT)    Recommendations for Other Services       Precautions / Restrictions Precautions Precautions: Fall Restrictions Weight Bearing Restrictions: No     Mobility  Bed Mobility Overal bed mobility: Needs Assistance Bed Mobility: Supine to Sit     Supine to sit: Mod assist   Sit to sidelying: +2 for safety/equipment General bed mobility comments: Requires cues for technique and to be sure he is bringing his left leg and arm along    Transfers Overall transfer level: Needs assistance Equipment  used: 1 person hand held assist Transfers: Sit to/from Stand;Bed to chair/wheelchair/BSC Sit to Stand: Mod assist Stand pivot transfers: Mod assist;+2 safety/equipment   Step pivot transfers: Mod assist;+2 safety/equipment     General transfer comment: Performed withou RW today. difficulty advancing legs with step pivot and with weight shift    Ambulation/Gait Ambulation/Gait assistance:  (performed pregait activities in standing with attempting to step forward and back with each foot twice)           Pre-gait activities: Standing and stepping with each foot     Stairs             Wheelchair Mobility    Modified Rankin (Stroke Patients Only) Modified Rankin (Stroke Patients Only) Pre-Morbid Rankin Score: No significant disability Modified Rankin: Severe disability     Balance Overall balance assessment: Needs assistance Sitting-balance support: Feet supported;Single extremity supported Sitting balance-Leahy Scale: Poor Sitting balance - Comments: Tends to lose balance posterior in sitting Postural control: Right lateral lean;Posterior lean Standing balance support: Single extremity supported Standing balance-Leahy Scale: Poor                              Cognition Arousal/Alertness: Awake/alert Behavior During Therapy: WFL for tasks assessed/performed Overall Cognitive Status: Impaired/Different from baseline Area of Impairment: Following commands                       Following Commands: Follows one step commands with increased time;Follows one step commands consistently       General Comments: L inattention  Exercises      General Comments General comments (skin integrity, edema, etc.): on O2 today. Sats generally stayed above 95%.  Cueing required to get pt to look past midline towards left      Pertinent Vitals/Pain Pain Assessment: No/denies pain    Home Living                          Prior  Function            PT Goals (current goals can now be found in the care plan section) Progress towards PT goals: Progressing toward goals    Frequency    Min 4X/week      PT Plan Current plan remains appropriate    Co-evaluation              AM-PAC PT "6 Clicks" Mobility   Outcome Measure  Help needed turning from your back to your side while in a flat bed without using bedrails?: A Lot Help needed moving from lying on your back to sitting on the side of a flat bed without using bedrails?: A Lot Help needed moving to and from a bed to a chair (including a wheelchair)?: A Lot Help needed standing up from a chair using your arms (e.g., wheelchair or bedside chair)?: A Lot Help needed to walk in hospital room?: Total Help needed climbing 3-5 steps with a railing? : Total 6 Click Score: 10    End of Session Equipment Utilized During Treatment: Gait belt Activity Tolerance: Patient tolerated treatment well Patient left: in chair;with call bell/phone within reach;with chair alarm set;Other (comment) (Family to return shortly) Nurse Communication: Mobility status PT Visit Diagnosis: Hemiplegia and hemiparesis Hemiplegia - Right/Left: Left Hemiplegia - dominant/non-dominant: Non-dominant Hemiplegia - caused by: Cerebral infarction     Time: 6203-5597 PT Time Calculation (min) (ACUTE ONLY): 40 min  Charges:  $Gait Training: 23-37 mins $Neuromuscular Re-education: 8-22 mins                     Lavonia Dana, PT   Acute Rehabilitation Services  Pager [phone] Office [phone] 01/09/2021    [name] 01/09/2021, 1:31 PM

## 2021-01-10 LAB — CBC
HCT: 26.6 % — ABNORMAL LOW (ref 39.0–52.0)
Hemoglobin: 8.5 g/dL — ABNORMAL LOW (ref 13.0–17.0)
MCH: 32.4 pg (ref 26.0–34.0)
MCHC: 32 g/dL (ref 30.0–36.0)
MCV: 101.5 fL — ABNORMAL HIGH (ref 80.0–100.0)
Platelets: 178 10*3/uL (ref 150–400)
RBC: 2.62 MIL/uL — ABNORMAL LOW (ref 4.22–5.81)
RDW: 18.2 % — ABNORMAL HIGH (ref 11.5–15.5)
WBC: 11.5 10*3/uL — ABNORMAL HIGH (ref 4.0–10.5)
nRBC: 0 % (ref 0.0–0.2)

## 2021-01-10 LAB — COMPREHENSIVE METABOLIC PANEL
ALT: 44 U/L (ref 0–44)
AST: 32 U/L (ref 15–41)
Albumin: 2 g/dL — ABNORMAL LOW (ref 3.5–5.0)
Alkaline Phosphatase: 121 U/L (ref 38–126)
Anion gap: 9 (ref 5–15)
BUN: 28 mg/dL — ABNORMAL HIGH (ref 8–23)
CO2: 20 mmol/L — ABNORMAL LOW (ref 22–32)
Calcium: 7.7 mg/dL — ABNORMAL LOW (ref 8.9–10.3)
Chloride: 116 mmol/L — ABNORMAL HIGH (ref 98–111)
Creatinine, Ser: 1.6 mg/dL — ABNORMAL HIGH (ref 0.61–1.24)
GFR, Estimated: 45 mL/min — ABNORMAL LOW (ref >60)
Glucose, Bld: 131 mg/dL — ABNORMAL HIGH (ref 70–99)
Potassium: 3.7 mmol/L (ref 3.5–5.1)
Sodium: 145 mmol/L (ref 135–145)
Total Bilirubin: 1.4 mg/dL — ABNORMAL HIGH (ref 0.3–1.2)
Total Protein: 4.8 g/dL — ABNORMAL LOW (ref 6.5–8.1)

## 2021-01-10 LAB — GLUCOSE, CAPILLARY
Glucose-Capillary: 100 mg/dL — ABNORMAL HIGH (ref 70–99)
Glucose-Capillary: 176 mg/dL — ABNORMAL HIGH (ref 70–99)
Glucose-Capillary: 125 mg/dL — ABNORMAL HIGH (ref 70–99)
Glucose-Capillary: 180 mg/dL — ABNORMAL HIGH (ref 70–99)

## 2021-01-10 MED ORDER — GERHARDT'S BUTT CREAM
TOPICAL_CREAM | CUTANEOUS | Status: DC
  Administered 2021-01-11 (×2): 1 via TOPICAL
  Filled 2021-01-10: qty 1

## 2021-01-10 MED ORDER — ALBUTEROL SULFATE (2.5 MG/3ML) 0.083% IN NEBU
2.5000 mg | INHALATION_SOLUTION | RESPIRATORY_TRACT | Status: DC | PRN
  Administered 2021-01-10: 2.5 mg via RESPIRATORY_TRACT
  Filled 2021-01-10: qty 3

## 2021-01-10 NOTE — Consult Note (Signed)
WOC Nurse Consult Note: Reason for Consult: MASD to buttocks, perineal area, scrotum and posterior and medial thighs secondary to fecal incontinence. Urinary incontinence is being managed by an indwelling urinary catheter at this time. An indwelling bowel management system is also present. Wound type:moisture with subsequent irritant contact dermatitis. There is also erythema intertrigo related to the incontinence in the inguinal areas.  ICD-10 CM Codes for Irritant Dermatitis  L24A2 - Due to fecal, urinary or dual incontinence  L30.4  - Erythema intertrigo. Also used for abrasion of the hand, chafing of the skin, dermatitis due to sweating and friction, friction dermatitis, friction eczema, and genital/thigh intertrigo.   Pressure Injury POA: N/A Wound bed: scattered partial thickness lesions Drainage (amount, consistency, odor) scant serous Periwound:erythematous, mild edema Dressing procedure/placement/frequency: Turning and repositioning is in place, I will add guidance to minimize time in the supine position to the orders.  Gerhart's Butt Cream, a compounded 1:1:1 cream consisting of hydrocortisone cream, lotrimin cream and zinc oxide, was ordered last evening, but I will increase the frequency for this and additionally add applications PRN post soiling. I have provided Orders for a mattress replacement with low air loss feature to mitigate microclimate and bilateral Prevalon Boots for pressure redistribution to the heels. I was assisted in the development of this POC by the patient's Bedside RN, Loyal Gambler, whose expertise was appreciated.  Recommendation:  Consider continuing bowel management device for 2-3 more days to allow skin to recover with aggressive interventions  WOC nursing team will not follow, but will remain available to this patient, the nursing and medical teams.  Please re-consult if needed. Thanks, Maudie Flakes, MSN, RN, [place], [facility]  Pager# [phone]

## 2021-01-10 NOTE — Progress Notes (Signed)
PROGRESS NOTE  Dalton Miller  VOJ:[id number] DOB: 14-Apr-1945 DOA: 12/29/2020 PCP: [facility], [facility]   Brief Narrative: Dalton Miller is a 76 y.o. male with a history of metastatic melanoma, lung CA, urothelial CA, tobacco use, thrombocytopenia, right shoulder OA and impingement s/p surgery 12/17/20 who presented to the [facility] 12/27 found down, unresponsive by the toilet at home (unknown duration, LKW previous evening when he complained of upper abd pain and black stool). He required intubation in the ED, noted to have acute right MCA CVA on CT. OGT placed with >500cc coffee ground emesis, hgb 7.1g/dl. LFTs elevated. +SARS-CoV-2 PCR also noted on admission to ICU at [facility]. EGD performed 12/28 revealed esophageal varices which were banded. He received 1u PRBCs on 1/1. Ultimately he was weaned off pressor support and extubated on 1/3, transferred to floor on hospitalist service as of 1/5. GI reengaged, cleared for antiplatelet and statin initiation, among other recommendations, and have scheduled follow up as an outpatient on 1/31. Lactulose dose is being decreased with improved diarrhea, rifaximin added. Rectal tube and foley catheter removed 1/8. The plan remains to pursue CIR at discharge.    Assessment & Plan: Principal Problem:   Acute respiratory failure with hypoxia (HCC) Active Problems:   CVA (cerebral vascular accident) ([place])   Gastrointestinal hemorrhage   Hyperammonemia (HCC)   Toxic metabolic encephalopathy   Jaundice   Abnormal LFTs   AKI (acute kidney injury) ([place])   Hypotension   COVID-19 virus infection   Leukocytosis   Thrombocytosis   Severe sepsis (HCC)   Hematemesis with nausea   Melena   Idiopathic esophageal varices with bleeding (HCC)   Alcoholic cirrhosis of liver with ascites (HCC)   Encephalopathy, hepatic  Acute blood loss anemia due to upper GI bleeding due to esophageal varices: s/p banding x4 12/28.  - Hgb 6.8 so an additional 1u PRBCs 1/1. Hgb 8.5, will  continue monitoring with initiation of aspirin. No clinical evidence of bleeding since endoscopy. - Continue PPI BID, completed octreotide.  - Follow up in place for 1/31 with Dr. Fuller Plan, with consideration of repeat EGD with possible banding.   Hepatic cirrhosis: By morphologic appearance on CT abd/pelvis. Also with ascites and changes consistent with portal HTN. - LFTs have improved steadily (normalized AP, AST, ALT), likely acutely worsened from sepsis/covid-19. At this level, no contraindication to initiating statin therapy, so started low dose atorvastatin with plans to increase as tolerated (by LFTs and clinically).  - Having ascites, pitting edema, would benefit from lasix/spiro, though renal function not back to baseline or seeming to have reached nadir. Will likely start lasix 20mg , spironolactone 50mg  once renal function optimized. No current evidence of SBP/discomfort, so not pursuing paracentesis. - 2g sodium restriction  Acute right MCA CVA affecting basal ganglia, right frontal lobe. Scattered cortical and subcortical acute infarcts in the right MCA distribution with mild petechial hemorrhage. MRA revealed 70% stenosis at the right M1 segment. - PT/OT to continue rehabilitation efforts at [facility]. - Stroke neurology signed off 12/30, recommending restarting statin (LDL 71) once LFTs improve and antiplatelet once bleeding subsided. Aspirin monotherapy initiated 1/7, 10 days after endoscopy.  - Will need neurology follow up in 6-8 weeks.   Acute hypoxic respiratory failure: Suspect this is due to aspiration pneumonia during admission that was only later seen on subsequent CT at lung bases.  - s/p abx - Weaned from oxygen, no hypoxia with ambulation. - Will give albuterol neb prn wheezing, though predominance of current clinical picture  is upper airway. He does have tobacco hx.  Acute hepatic/metabolic encephalopathy: Multifactorial.  - Continue delirium precautions - Decreased lactulose  in effort to remove rectal tube to facilitate rehabilitation. 150cc out over past 24 hours, will remove tube for now and monitor. He has MASD and is at risk of worsening, so we may have to reinsert this depending on how he does.  - Added rifaximin 550mg  BID.    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 ?if related to liver disease. Oddly, had thrombocytosis on presentation here and platelets have remained normal. - Continue monitoring, not continuing promacta 25mg  daily.   AKI, solitary kidney s/p nephrectomy due to RCC: Previous baseline Cr appears to be ~1.0.  - SCr continues improving, down to 1.6, unclear where CrCl will settle. Will continue monitoring intermittently to minimize blood draws. Holding ACE and holding initiation of diuretics at this time.   Moderate protein calorie malnutrition:  - Encourage enteral intake   Hypernatremia: Resolved.  T2DM: HbA1c was 7.6% on 12/14/2020 (PTA), 6.2% during this admission (before transfusion) and has been <6.5% on multiple checks since 2016.  - PCP follow up recommended. Continue CBGs AC/HS to ensure tight glycemic control.   HTN:  - Holding lisinopril with AKI.  - Initial permissive HTN, started norvasc 5mg  > 10mg .  Severe sepsis with lactic acidosis: Resolved. Source remains unclear. Completed ceftriaxone x7 days as of 1/2.   Metastatic recurrent melanoma: LUL lesion, mediastinal adenopathy. s/p 4 cycles ipi/nivo.  - Outpatient follow up to continue - Note 2 new liver lesions on CT on Jan 03, 2021 > recommend further evaluation as outpatient with dedicated multiphase abdominal MRI w/contrast.  Covid-19 infection: Appears incidental. Was not able to take paxlovid or mulnupiravir due to LFT derangements or paxlovid due to NPO/OGT. Outside window of potential benefit and never needed steroids.  - Continued isolation x10 days from positive test (12/27). No longer requires  isolation or treatment.  Recurrent low grade urothelial carcinoma: s/p TURBT x3 in total.  - Continue outpatient follow up. Continue tamsulosin  DVT prophylaxis: SCDs Code Status: Full Family Communication: Different son at bedside.  Disposition Plan:  Status is: Inpatient  Remains inpatient appropriate because: Possibly DC to CIR 1/9.  Consultants:  PCCM [facility]  Procedures:  EGD 12/28 Dr. Fuller Plan:  Impression:        - Grade II esophageal varices. Completely                            eradicated. Banded.                           - Red blood clots in the gastric fundus.                           - Normal duodenal bulb and second portion of the                            duodenum.                           - No specimens collected.  Antimicrobials: Ceftriaxone x7 days   Subjective: Eager to remove rectal tube, had foley removed. He was wheezing earlier, the RN could hear it from  the door, though he's not hypoxic and denies dyspnea. Slept like a baby last night.  Objective: Vitals:   01/09/21 2353 01/10/21 0324 01/10/21 0340 01/10/21 0900  BP: 133/68  133/62 126/76  Pulse: 85  69 71  Resp: (!) 23  20 (!) 29  Temp: 98.2 F (36.8 C)  98 F (36.7 C) 98.3 F (36.8 C)  TempSrc: Oral  Oral Oral  SpO2: 99%  96% 95%  Weight:  85 kg    Height:        Intake/Output Summary (Last 24 hours) at 01/10/2021 1117 Last data filed at 01/10/2021 0544 Gross per 24 hour  Intake 90 ml  Output 1260 ml  Net -1170 ml   Filed Weights   01/05/21 0500 01/09/21 0457 01/10/21 0324  Weight: 86.6 kg 83.9 kg 85 kg   Gen: 76 y.o. male in no distress Pulm: Nonlabored breathing room air. Upper airway noises transmitted intermittently, scant end-expiratory squeaks, good air movement. CV: Regular rate and rhythm. No murmur, rub, or gallop. No JVD, stable 1-2+ dependent edema. GI: Abdomen distended, stable, but non-tender, +BS Ext: Warm, no deformities Skin: No new rashes, lesions or ulcers on  visualized skin. Neuro: Alert and oriented. No focal neurological deficits. Psych: Judgement and insight appear fair. Mood euthymic & affect congruent. Behavior is appropriate.    Data Reviewed: I have personally reviewed following labs and imaging studies  CBC: Recent Labs  Lab 01/04/21 0418 01/05/21 0511 01/07/21 1234 01/08/21 0500 01/10/21 0327  WBC 10.8* 10.1 12.2* 12.0* 11.5*  HGB 8.2* 8.0* 9.9* 9.5* 8.5*  HCT 25.5* 25.8* 31.4* 30.8* 26.6*  MCV 97.3 102.4* 102.3* 102.3* 101.5*  PLT 245 229 233 224 960   Basic Metabolic Panel: Recent Labs  Lab 01/04/21 0418 01/04/21 1955 01/05/21 0511 01/05/21 0703 01/05/21 1616 01/06/21 0600 01/07/21 1234 01/08/21 0500 01/10/21 0327  NA 147*  --  148* 149*  --  146* 145 146* 145  K 3.6  --  3.4* 3.3*  --  3.7 4.1 3.7 3.7  CL 115*  --  119* 121*  --  120* 118* 119* 116*  CO2 18*  --  18* 20*  --  20* 20* 19* 20*  GLUCOSE 393*  --  516* 199*  --  180* 218* 147* 131*  BUN 83*  --  67* 73*  --  60* 49* 42* 28*  CREATININE 3.40*  --  2.85* 3.03*  --  2.47*   2.55* 2.08* 1.88* 1.60*  CALCIUM 7.5*  --  7.4* 7.3*  --  7.7* 8.0* 7.9* 7.7*  MG 1.8 1.7 1.7  --  2.6* 2.1  --   --   --   PHOS 3.5 3.5 3.7  --  4.0 4.0  --   --   --    GFR: Estimated Creatinine Clearance: 41.6 mL/min (A) (by C-G formula based on SCr of 1.6 mg/dL (H)). Liver Function Tests: Recent Labs  Lab 01/04/21 0418 01/07/21 1234 01/10/21 0327  AST 44* 30 32  ALT 121* 64* 44  ALKPHOS 108 151* 121  BILITOT 2.2* 1.4* 1.4*  PROT 4.3* 5.0* 4.8*  ALBUMIN 1.7* 2.1* 2.0*   No results for input(s): LIPASE, AMYLASE in the last 168 hours. No results for input(s): AMMONIA in the last 168 hours. Coagulation Profile: No results for input(s): INR, PROTIME in the last 168 hours.  Cardiac Enzymes: No results for input(s): CKTOTAL, CKMB, CKMBINDEX, TROPONINI in the last 168 hours. BNP (last 3 results) No results for  input(s): PROBNP in the last 8760 hours. HbA1C: No  results for input(s): HGBA1C in the last 72 hours. CBG: Recent Labs  Lab 01/09/21 0552 01/09/21 1235 01/09/21 1645 01/09/21 2140 01/10/21 0604  GLUCAP 160* 161* 140* 134* 100*   Lipid Profile: No results for input(s): CHOL, HDL, LDLCALC, TRIG, CHOLHDL, LDLDIRECT in the last 72 hours. Thyroid Function Tests: No results for input(s): TSH, T4TOTAL, FREET4, T3FREE, THYROIDAB in the last 72 hours. Anemia Panel: No results for input(s): VITAMINB12, FOLATE, FERRITIN, TIBC, IRON, RETICCTPCT in the last 72 hours. Urine analysis:    Component Value Date/Time   COLORURINE YELLOW 12/29/2020 1012   APPEARANCEUR CLEAR 12/29/2020 1012   LABSPEC 1.015 12/29/2020 1012   PHURINE 5.5 12/29/2020 1012   GLUCOSEU 100 (A) 12/29/2020 1012   HGBUR NEGATIVE 12/29/2020 1012   BILIRUBINUR NEGATIVE 12/29/2020 1012   BILIRUBINUR negative 02/17/2015 1336   KETONESUR NEGATIVE 12/29/2020 1012   PROTEINUR NEGATIVE 12/29/2020 1012   UROBILINOGEN negative 02/17/2015 1336   UROBILINOGEN 0.2 01/27/2014 2028   NITRITE NEGATIVE 12/29/2020 1012   LEUKOCYTESUR NEGATIVE 12/29/2020 1012   No results found for this or any previous visit (from the past 240 hour(s)).     Radiology Studies: No results found.  Scheduled Meds:  alteplase  2 mg Intracatheter Once   amLODipine  10 mg Oral Daily   artificial tears   Both Eyes QHS   aspirin EC  81 mg Oral Daily   atorvastatin  10 mg Oral Daily   Chlorhexidine Gluconate Cloth  6 each Topical Daily   feeding supplement (GLUCERNA SHAKE)  237 mL Oral TID BM   Gerhardt's butt cream   Topical Q4H   insulin aspart  0-15 Units Subcutaneous TID WC   lactulose  10 g Oral Daily   multivitamin with minerals  1 tablet Oral Daily   pantoprazole  40 mg Oral BID   Ensure Max Protein  11 oz Oral Daily   rifaximin  550 mg Oral BID   sodium chloride flush  10-40 mL Intracatheter Q12H   Continuous Infusions:  sodium chloride Stopped (01/02/21 0856)     LOS: 12 days    Patrecia Pour, MD [facility] [email or web address] 01/10/2021, 11:17 AM

## 2021-01-11 LAB — GLUCOSE, CAPILLARY
Glucose-Capillary: 125 mg/dL — ABNORMAL HIGH (ref 70–99)
Glucose-Capillary: 170 mg/dL — ABNORMAL HIGH (ref 70–99)
Glucose-Capillary: 175 mg/dL — ABNORMAL HIGH (ref 70–99)

## 2021-01-11 NOTE — Progress Notes (Signed)
Physical Therapy Treatment Patient Details Name: Dalton Miller MRN: [id number] DOB: 04/09/1945 Today's Date: 01/11/2021   History of Present Illness 76 yo male presents to ED on 12/27 with unresponsiveness, hypoxia. Pt with recent shoulder surgery. ETT 12/27-1/3; CTH and MRI show acute R MCA CVA. Pt also with heme + stools, covid +. PMH includes OA, renal cell cancer s/p nephrectomy, melanoma with lung mets and urothelial cancer, cellulitis, HLD, HTN, DMII, COPD, R TKA,    PT Comments    Patient progressing slowly with continued weakness, decreased activity tolerance and today with audible wheezes, though on RA with SpO2 94%.  Patient still with significant L side weakness and L inattention.  He has significant assist available with family in daily to visit.  Still feel he is excellent candidate for acute inpatient rehab at d/c.    Recommendations for follow up therapy are one component of a multi-disciplinary discharge planning process, led by the attending physician.  Recommendations may be updated based on patient status, additional functional criteria and insurance authorization.  Follow Up Recommendations  Acute inpatient rehab (3hours/day)     Assistance Recommended at Discharge Frequent or constant Supervision/Assistance  Patient can return home with the following A lot of help with bathing/dressing/bathroom;Assistance with feeding;Assist for transportation;Help with stairs or ramp for entrance;A lot of help with walking and/or transfers   Equipment Recommendations       Recommendations for Other Services       Precautions / Restrictions Precautions Precautions: Fall     Mobility  Bed Mobility Overal bed mobility: Needs Assistance Bed Mobility: Rolling Rolling: Mod assist Sidelying to sit: Mod assist       General bed mobility comments: rolling in bed for hygiene initially due to soiled with BM,  Patient side to sit with A for trunk and L LE off the bed, L Ue to rail     Transfers Overall transfer level: Needs assistance Equipment used: Rolling walker (2 wheels) Transfers: Sit to/from Stand Sit to Stand: Mod assist;+2 physical assistance     Step pivot transfers: Mod assist;+2 safety/equipment     General transfer comment: with RW taking steps toward R toward chair with cues, assist for balance, walker management and L LE progression.    Ambulation/Gait                   Stairs             Wheelchair Mobility    Modified Rankin (Stroke Patients Only) Modified Rankin (Stroke Patients Only) Pre-Morbid Rankin Score: No significant disability Modified Rankin: Severe disability     Balance Overall balance assessment: Needs assistance Sitting-balance support: Feet supported Sitting balance-Leahy Scale: Poor Sitting balance - Comments: close S to minguard A with cues for anterior and L weight shift Postural control: Posterior lean;Right lateral lean Standing balance support: Bilateral upper extremity supported Standing balance-Leahy Scale: Poor Standing balance comment: min A and UE support for balance static, working on L side attention in sitting EOB                            Cognition Arousal/Alertness: Awake/alert Behavior During Therapy: WFL for tasks assessed/performed Overall Cognitive Status: Impaired/Different from baseline Area of Impairment: Following commands;Attention;Safety/judgement;Problem solving                   Current Attention Level: Sustained   Following Commands: Follows one step commands with increased time;Follows one step commands  consistently Safety/Judgement: Decreased awareness of deficits   Problem Solving: Slow processing;Decreased initiation;Requires verbal cues          Exercises      General Comments General comments (skin integrity, edema, etc.): on RA, SpO2 94%, dyspnea about 2-3/4 with some audible wheezing, RN made aware; daughter arrived during session       Pertinent Vitals/Pain Pain Assessment: No/denies pain    Home Living                          Prior Function            PT Goals (current goals can now be found in the care plan section) Progress towards PT goals: Progressing toward goals    Frequency    Min 4X/week      PT Plan Current plan remains appropriate    Co-evaluation              AM-PAC PT "6 Clicks" Mobility   Outcome Measure  Help needed turning from your back to your side while in a flat bed without using bedrails?: A Lot Help needed moving from lying on your back to sitting on the side of a flat bed without using bedrails?: A Lot Help needed moving to and from a bed to a chair (including a wheelchair)?: Total Help needed standing up from a chair using your arms (e.g., wheelchair or bedside chair)?: A Lot Help needed to walk in hospital room?: Total Help needed climbing 3-5 steps with a railing? : Total 6 Click Score: 9    End of Session Equipment Utilized During Treatment: Gait belt Activity Tolerance: Patient tolerated treatment well Patient left: in chair;with call bell/phone within reach;with chair alarm set   PT Visit Diagnosis: Hemiplegia and hemiparesis Hemiplegia - dominant/non-dominant: Non-dominant Hemiplegia - caused by: Cerebral infarction     Time: 1130-1206 PT Time Calculation (min) (ACUTE ONLY): 36 min  Charges:  $Therapeutic Activity: 23-37 mins                     Dalton Miller, PT Acute Rehabilitation Services Pager:[phone] Office:[phone] 01/11/2021    Dalton Miller 01/11/2021, 1:19 PM

## 2021-01-11 NOTE — H&P (Signed)
Physical Medicine and Rehabilitation Admission H&P    Chief Complaint  Patient presents with   Altered Mental Status  : HPI: Dalton Miller is a 76 year old right-handed male with complicated medical history including melanoma with lung metastasis and urothelial carcinoma/renal cell cancer status post nephrectomy, hypertension, hyperlipidemia,, recent shoulder surgery, type 2 diabetes mellitus, COPD/tobacco use, right TKA.  Per chart review lives with youngest son.  Modified independent prior to admission.  1 level home with ramped entrance.  Presented 12/29/2020 with altered mental status after a fall to [facility], ER.  Admission chemistries unremarkable aside glucose 284, BUN 95, creatinine 1.77, AST 324, ALT 229, alkaline phosphatase 261, total bilirubin 3.6, WBC 40,700, hemoglobin 7.1, ammonia level 115, lactic acid greater than 9.0, blood cultures no growth to date, CK  357, COVID-19 positive.[name]  He was found to be obtunded and demonstrated disconjugate gaze as well as noted to be jaundiced.  He required intubation for airway protection due to hypoxia.  Family had noted complaints of abdominal pain and black tarry stools from the previous day.  Hemoccult was positive.  CT of the head showed ill-defined hypodensity involving the right basal ganglia and right frontal lobe concerning for acute right MCA territory infarct.  CT cervical spine negative for acute spine fracture or traumatic listhesis.  Ultrasound abdomen showed gallbladder wall thickening without evidence of cholelithiasis or additional findings to suggest the presence of acute cholecystitis.  Patient was transferred to [facility].  MRI/MRA 12/31/2020 scattered cortical and subcortical acute infarcts in the right MCA distribution with mild petechial hemorrhage.  70% stenosis of the right M1 segment.  Echocardiogram with ejection fraction of 50 to 55% no wall motion abnormalities grade 1 diastolic dysfunction.  Hospital course  patient underwent upper GI endoscopy 12/30/2020 per Dr. Lucio Edward findings of grade 2 varices in the distal esophagus requiring banding some red blood clots in the gastric fundus.  Normal duodenal bulb and second portion of duodenum.  He did receive 1 unit packed red blood cells.  In regards to patient's COVID-19 was not able to take paxlovid or mulnupiravir due to LFT derangement and outside of window for benefit of steroids.  Continued isolation x10 days from positive test 12/27 and isolation has been completed.  Neurology follow-up for CVA currently maintained on low-dose aspirin for CVA prophylaxis/no antithrombotic at this time secondary to GI bleed.  Patient was weaned from pressor support extubated 01/06/2020.  Rectal tube and Foley tube removed 01/11/2020.  His LFTs have markedly improved he remains on lactulose.  CT abdomen pelvis follow-up 01/05/2021 morphology feature liver suggestive of early cirrhosis moderate volume of ascites within the abdomen and pelvis.  There were 2 new low-attenuation structures within the dome of the liver.  These were new when compared to CT of 04/04/2019.  Cannot exclude benign or malignant liver lesions and recommendations were for outpatient follow-up.  He is tolerating a regular consistency diet.  WOC follow-up for MASD buttocks, perineal area, scrotum posterior medial thighs secondary to fecal incontinence with recommendations of routine skin care.  Therapy evaluations completed due to patient decreased functional mobility/multi medical was admitted for a comprehensive rehab program.  Review of Systems  Constitutional:  Negative for chills and fever.  HENT:  Negative for hearing loss.   Eyes:  Negative for blurred vision and double vision.  Respiratory:  Positive for shortness of breath. Negative for cough and wheezing.   Cardiovascular:  Positive for leg swelling. Negative for chest pain  and palpitations.  Gastrointestinal:  Positive for abdominal pain, blood in stool  and diarrhea. Negative for heartburn.  Genitourinary:  Negative for dysuria, flank pain and hematuria.  Musculoskeletal:  Positive for joint pain and myalgias.  Skin:  Negative for rash.  All other systems reviewed and are negative. Past Medical History:  Diagnosis Date   Acquired renal cyst of right kidney    Arthritis    Cancer ([place])    renal cell    Erectile dysfunction    Heart murmur    CHILDHOOD   Hematuria    left ureteral bleeding   History of cellulitis    2012 left wrist   History of kidney stones    Hyperlipidemia    Hypertension    Metastatic melanoma to lung, left ([place])    Nephrolithiasis    bilateral non-obstructive per ct 07-02-2015   Type 2 diabetes mellitus ([place])    Wears glasses    Past Surgical History:  Procedure Laterality Date   CARPAL TUNNEL RELEASE Right 1990  approx   COLONOSCOPY  03/04/2010   CYSTOSCOPY W/ RETROGRADES Left 10/28/2015   Procedure: CYSTOSCOPY WITH LEFT RETROGRADE PYELOGRAM;  Surgeon: Cleon Gustin, MD;  Location: [facility];  Service: Urology;  Laterality: Left;   CYSTOSCOPY W/ URETERAL STENT PLACEMENT Left 10/28/2015   Procedure: CYSTOSCOPY WITH LEFT URETERAL STENT EXCHANGE;  Surgeon: Cleon Gustin, MD;  Location: [facility];  Service: Urology;  Laterality: Left;   CYSTOSCOPY WITH RETROGRADE PYELOGRAM, URETEROSCOPY AND STENT PLACEMENT Bilateral 09/17/2015   Procedure: CYSTOSCOPY WITH BILATERAL RETROGRADE PYELOGRAM, LEFT URETEROSCOPY WITH URETERAL BIOPSY AND  STENT PLACEMENT;  Surgeon: Irine Seal, MD;  [facility];  Service: Urology;  Laterality: Bilateral;   ESOPHAGEAL BANDING  12/30/2020   Procedure: ESOPHAGEAL BANDING;  Surgeon: Ladene Artist, MD;  Location: [facility] ENDOSCOPY;  Service: Endoscopy;;   ESOPHAGOGASTRODUODENOSCOPY (EGD) WITH PROPOFOL N/A 12/30/2020   Procedure: ESOPHAGOGASTRODUODENOSCOPY (EGD) WITH PROPOFOL;  Surgeon: Ladene Artist, MD;  Location: [facility] ENDOSCOPY;  Service: Endoscopy;   Laterality: N/A;   HOLMIUM LASER APPLICATION Left 84/16/6063   Procedure: HOLMIUM LASER APPLICATION;  Surgeon: Irine Seal, MD;  Location: [facility];  Service: Urology;  Laterality: Left;   KNEE ARTHROSCOPY Right 2008   MELANOMA EXCISION  2018   Left arm   right total knee replacement  Right    2009   ROBOT ASSITED LAPAROSCOPIC NEPHROURETERECTOMY Left 12/04/2015   Procedure: XI ROBOT ASSITED LAPAROSCOPIC NEPHROURETERECTOMY;  Surgeon: Cleon Gustin, MD;  Location: [facility];  Service: Urology;  Laterality: Left;   SHOULDER ARTHROSCOPY Right 12/17/2020   Procedure: Right shoulder arthroscopy, debridement, subacromial decompression, distal clavicle resection;  Surgeon: Justice Britain, MD;  Location: [facility];  Service: Orthopedics;  Laterality: Right;  37min   STONE EXTRACTION WITH BASKET Left 09/17/2015   Procedure: STONE EXTRACTION WITH BASKET;  Surgeon: Irine Seal, MD;  Location: [facility];  Service: Urology;  Laterality: Left;   TOTAL KNEE ARTHROPLASTY Right 2009   URETEROSCOPY  10/28/2015   Procedure: DIAGNOSTIC LEFT URETEROSCOPY;  Surgeon: Cleon Gustin, MD;  Location: [facility];  Service: Urology;;   WISDOM TOOTH EXTRACTION     Family History  Problem Relation Age of Onset   Diabetes Mother    Heart disease Mother    Stroke Mother    CAD Mother    Diabetes Father    Congestive Heart Failure Father    Cancer Brother    Heart disease Son  COPD Son    Emphysema Son    Heart attack Brother    Social History:  reports that he has been smoking cigars and cigarettes. He has a 94.50 pack-year smoking history. He quit smokeless tobacco use about 28 years ago.  His smokeless tobacco use included chew. He reports that he does not drink alcohol and does not use drugs. Allergies: No Known Allergies Medications Prior to Admission  Medication Sig Dispense Refill   amLODipine (NORVASC) 2.5 MG tablet Take 2.5 mg by mouth daily.     carboxymethylcellulose  (REFRESH PLUS) 0.5 % SOLN Place 1 drop into both eyes in the morning, at noon, and at bedtime.     cyclobenzaprine (FLEXERIL) 10 MG tablet Take 1 tablet (10 mg total) by mouth 3 (three) times daily as needed for muscle spasms. 30 tablet 1   eltrombopag (PROMACTA) 25 MG tablet Take 25 mg by mouth daily.     loratadine (CLARITIN) 10 MG tablet Take 10 mg by mouth daily.     naproxen (NAPROSYN) 500 MG tablet Take 1 tablet (500 mg total) by mouth 2 (two) times daily with a meal. 60 tablet 1   oxyCODONE (OXY IR/ROXICODONE) 5 MG immediate release tablet Take 5 mg by mouth every 6 (six) hours as needed.     pregabalin (LYRICA) 75 MG capsule Take 75 mg by mouth 2 (two) times daily.     White Petrolatum-Mineral Oil (REFRESH P.M. OP) Place 1 application into both eyes at bedtime.     aspirin EC 81 MG tablet Take 1 tablet (81 mg total) by mouth daily. (Patient not taking: Reported on 12/09/2020) 90 tablet 1   atorvastatin (LIPITOR) 10 MG tablet Take 1 tablet (10 mg total) by mouth daily. (Patient not taking: Reported on 12/09/2020) 90 tablet 2   fluticasone (FLONASE) 50 MCG/ACT nasal spray SPRAY 2 SPRAYS INTO EACH NOSTRIL EVERY DAY (Patient not taking: Reported on 12/09/2020) 48 mL 1   lisinopril (ZESTRIL) 10 MG tablet TAKE 1 TABLET (10 MG TOTAL) BY MOUTH DAILY. NEEDS TO BE SEEN BEFORE NEXT REFILL. (Patient not taking: Reported on 12/09/2020) 30 tablet 0   ondansetron (ZOFRAN) 4 MG tablet Take 1 tablet (4 mg total) by mouth every 8 (eight) hours as needed for nausea or vomiting. (Patient not taking: Reported on 12/29/2020) 10 tablet 0   oxyCODONE-acetaminophen (PERCOCET) 5-325 MG tablet Take 1 tablet by mouth every 4 (four) hours as needed (max 6 q). (Patient not taking: Reported on 12/29/2020) 20 tablet 0    Drug Regimen Review Drug regimen was reviewed and remains appropriate with no significant issues identified  Home: Home Living Family/patient expects to be discharged to:: Inpatient rehab Living  Arrangements: Children Available Help at Discharge: Family, Available 24 hours/day Type of Home: Mobile home Home Access: Ramped entrance Home Layout: One level Bathroom Shower/Tub: Chiropodist: Standard Bathroom Accessibility: Yes Home Equipment: Cane - single point, BSC/3in1   Functional History: Prior Function Prior Level of Function : Independent/Modified Independent  Functional Status:  Mobility: Bed Mobility Overal bed mobility: Needs Assistance Bed Mobility: Supine to Sit Rolling: Mod assist Sidelying to sit: Mod assist Supine to sit: Mod assist Sit to sidelying: +2 for safety/equipment General bed mobility comments: Requires cues for technique and to be sure he is bringing his left leg and arm along Transfers Overall transfer level: Needs assistance Equipment used: 1 person hand held assist Transfers: Sit to/from Stand, Bed to chair/wheelchair/BSC Sit to Stand: Mod assist Bed to/from chair/wheelchair/BSC transfer  type:: Stand pivot Stand pivot transfers: Mod assist, +2 safety/equipment Step pivot transfers: Mod assist, +2 safety/equipment General transfer comment: Performed withou RW today. difficulty advancing legs with step pivot and with weight shift Ambulation/Gait Ambulation/Gait assistance:  (performed pregait activities in standing with attempting to step forward and back with each foot twice) Pre-gait activities: Standing and stepping with each foot    ADL: ADL Overall ADL's : Needs assistance/impaired Eating/Feeding: Set up, Cueing for sequencing, Cueing for compensatory techinques, Bed level Grooming: Minimal assistance, Bed level Upper Body Bathing: Maximal assistance, Bed level Lower Body Bathing: Maximal assistance, +2 for physical assistance, +2 for safety/equipment, Sitting/lateral leans, Sit to/from stand Upper Body Dressing : Maximal assistance, Bed level Lower Body Dressing: Maximal assistance, +2 for physical assistance, +2  for safety/equipment, Sitting/lateral leans, Sit to/from stand Toilet Transfer: Maximal assistance, +2 for physical assistance, +2 for safety/equipment, Stand-pivot Toileting- Clothing Manipulation and Hygiene: Maximal assistance, +2 for physical assistance, +2 for safety/equipment, Sitting/lateral lean, Sit to/from stand General ADL Comments: Due to poor balance, incoordination/weakness of LUE, pt requiring max A +2 for all ADL's, difficulty with balnce EOB, some needing to be completed with bed in chair position  Cognition: Cognition Overall Cognitive Status: Impaired/Different from baseline Orientation Level: Oriented X4 Cognition Arousal/Alertness: Awake/alert Behavior During Therapy: WFL for tasks assessed/performed Overall Cognitive Status: Impaired/Different from baseline Area of Impairment: Following commands Orientation Level: Disoriented to, Time Current Attention Level: Sustained Following Commands: Follows one step commands with increased time, Follows one step commands consistently Safety/Judgement: Decreased awareness of deficits Problem Solving: Slow processing, Decreased initiation, Difficulty sequencing, Requires verbal cues General Comments: L inattention  Physical Exam: Blood pressure 128/76, pulse 73, temperature 98 F (36.7 C), temperature source Oral, resp. rate 20, height 5\' 7"  (1.702 m), weight 85 kg, SpO2 97 %. Physical Exam Constitutional:      General: He is not in acute distress. HENT:     Head: Normocephalic.     Right Ear: External ear normal.     Left Ear: External ear normal.     Nose: Nose normal.  Eyes:     Pupils: Pupils are equal, round, and reactive to light.  Cardiovascular:     Rate and Rhythm: Normal rate and regular rhythm.     Heart sounds: No murmur heard.   No gallop.  Pulmonary:     Effort: Pulmonary effort is normal. No respiratory distress.     Breath sounds: No wheezing.  Abdominal:     General: Bowel sounds are normal. There  is no distension.     Palpations: Abdomen is soft.     Tenderness: There is no abdominal tenderness.  Musculoskeletal:        General: No swelling, tenderness or deformity.     Cervical back: Normal range of motion.  Skin:    General: Skin is warm and dry.  Neurological:     Mental Status: He is alert.     Comments: Patient is alert.  Makes eye contact with examiner.  Provides name and age but limited medical historian. Normal language. Speech sl dysarthric. Follows simple commands. Left central 7 and tongue deviation. Right gaze preference.  LUE 2 to 2+/5. LLE 3 to 3+/5. RUE and RLE 4 to 4+/5. No obvious sensory deficits. No limb ataxia apparent. Normal resting tone and DTR's 1+.     Results for orders placed or performed during the hospital encounter of 12/29/20 (from the past 48 hour(s))  Glucose, capillary     Status: Abnormal  Collection Time: 01/09/21 12:35 PM  Result Value Ref Range   Glucose-Capillary 161 (H) 70 - 99 mg/dL    Comment: Glucose reference range applies only to samples taken after fasting for at least 8 hours.   Comment 1 Notify RN    Comment 2 Document in Chart   Glucose, capillary     Status: Abnormal   Collection Time: 01/09/21  4:45 PM  Result Value Ref Range   Glucose-Capillary 140 (H) 70 - 99 mg/dL    Comment: Glucose reference range applies only to samples taken after fasting for at least 8 hours.   Comment 1 Notify RN    Comment 2 Document in Chart   Glucose, capillary     Status: Abnormal   Collection Time: 01/09/21  9:40 PM  Result Value Ref Range   Glucose-Capillary 134 (H) 70 - 99 mg/dL    Comment: Glucose reference range applies only to samples taken after fasting for at least 8 hours.   Comment 1 Notify RN    Comment 2 Document in Chart   Comprehensive metabolic panel     Status: Abnormal   Collection Time: 01/10/21  3:27 AM  Result Value Ref Range   Sodium 145 135 - 145 mmol/L   Potassium 3.7 3.5 - 5.1 mmol/L   Chloride 116 (H) 98 - 111  mmol/L   CO2 20 (L) 22 - 32 mmol/L   Glucose, Bld 131 (H) 70 - 99 mg/dL    Comment: Glucose reference range applies only to samples taken after fasting for at least 8 hours.   BUN 28 (H) 8 - 23 mg/dL   Creatinine, Ser 1.60 (H) 0.61 - 1.24 mg/dL   Calcium 7.7 (L) 8.9 - 10.3 mg/dL   Total Protein 4.8 (L) 6.5 - 8.1 g/dL   Albumin 2.0 (L) 3.5 - 5.0 g/dL   AST 32 15 - 41 U/L   ALT 44 0 - 44 U/L   Alkaline Phosphatase 121 38 - 126 U/L   Total Bilirubin 1.4 (H) 0.3 - 1.2 mg/dL   GFR, Estimated 45 (L) >60 mL/min    Comment: (NOTE) Calculated using the CKD-EPI Creatinine Equation (2021)    Anion gap 9 5 - 15    Comment: Performed at [facility], [place] [address], [place], [place] [address]  CBC     Status: Abnormal   Collection Time: 01/10/21  3:27 AM  Result Value Ref Range   WBC 11.5 (H) 4.0 - 10.5 K/uL   RBC 2.62 (L) 4.22 - 5.81 MIL/uL   Hemoglobin 8.5 (L) 13.0 - 17.0 g/dL   HCT 26.6 (L) 39.0 - 52.0 %   MCV 101.5 (H) 80.0 - 100.0 fL   MCH 32.4 26.0 - 34.0 pg   MCHC 32.0 30.0 - 36.0 g/dL   RDW 18.2 (H) 11.5 - 15.5 %   Platelets 178 150 - 400 K/uL   nRBC 0.0 0.0 - 0.2 %    Comment: Performed at [facility], [place] [address] [place], [place] [address]  Glucose, capillary     Status: Abnormal   Collection Time: 01/10/21  6:04 AM  Result Value Ref Range   Glucose-Capillary 100 (H) 70 - 99 mg/dL    Comment: Glucose reference range applies only to samples taken after fasting for at least 8 hours.   Comment 1 Notify RN    Comment 2 Document in Chart   Glucose, capillary     Status: Abnormal   Collection Time:  01/10/21  1:18 PM  Result Value Ref Range   Glucose-Capillary 176 (H) 70 - 99 mg/dL    Comment: Glucose reference range applies only to samples taken after fasting for at least 8 hours.   Comment 1 Notify RN    Comment 2 Document in Chart   Glucose, capillary     Status: Abnormal   Collection Time: 01/10/21  4:56 PM  Result Value Ref Range   Glucose-Capillary 125  (H) 70 - 99 mg/dL    Comment: Glucose reference range applies only to samples taken after fasting for at least 8 hours.   Comment 1 Notify RN    Comment 2 Document in Chart   Glucose, capillary     Status: Abnormal   Collection Time: 01/10/21 10:06 PM  Result Value Ref Range   Glucose-Capillary 180 (H) 70 - 99 mg/dL    Comment: Glucose reference range applies only to samples taken after fasting for at least 8 hours.   Comment 1 Notify RN    Comment 2 Document in Chart   Glucose, capillary     Status: Abnormal   Collection Time: 01/11/21  6:06 AM  Result Value Ref Range   Glucose-Capillary 125 (H) 70 - 99 mg/dL    Comment: Glucose reference range applies only to samples taken after fasting for at least 8 hours.   Comment 1 Notify RN    Comment 2 Document in Chart    No results found.     Medical Problem List and Plan: 1. Functional deficits  disconjugate gaze with altered mental status/acute hepatic metabolic encephalopathy/multifactorial secondary to right MCA scattered infarcts  -patient may shower  -ELOS/Goals: 14-17 days, min assist goals with PT, OT, SLP 2.  Antithrombotics: -DVT/anticoagulation:  Mechanical: Antiembolism stockings, thigh (TED hose) Bilateral lower extremities  -antiplatelet therapy: Aspirin 81 mg daily 3. Pain Management: Tylenol held due to elevated LFTs 4. Mood: Provide emotional support  -antipsychotic agents: N/A 5. Neuropsych: This patient is capable of making decisions on his own behalf. 6. Skin/Wound Care: Routine skin checks 7. Fluids/Electrolytes/Nutrition: Routine in and outs with follow-up chemistries 8.  Acute blood loss anemia due to upper GI bleed esophageal varices.  Status post banding x4 12/30/2020.   -Continue Protonix twice daily.   -serial hgb's -Follow-up per GI services 9.  Acute hypoxic respiratory failure.  Extubated 01/06/2020 10.  Metastatic recurrent melanoma/left upper lobe lesion, mediastinal adenopathy.  Status post 4 cycles  ipo.nivo.  Follow-up outpatient  -currently on oxygen. Wean to off as able 10.  COVID-19 infection.  Appeared incidental.  Isolation completed. 11.  Recurrent low-grade urothelial carcinoma.  Status post TURBT x3.  Follow-up outpatient.   12.  Elevated LFTs/hepatic cirrhosis.  Markedly improved.  Continue lactulose.  Holding Lasix/Spiro due to AKI 13.  AKI.  Status post solitary kidney status post nephrectomy due to [place]. 14.  Diabetes mellitus.  Hemoglobin A1c 7.6.  SSI.  -borderline to fair control at present 15.  Hypertension.  Norvasc 10 mg daily.  Monitor with increased mobility 16.  Hyperlipidemia.  Lipitor resumed 10 mg daily   Cathlyn Parsons, PA-C 01/11/2021

## 2021-01-11 NOTE — Progress Notes (Signed)
PROGRESS NOTE  Dalton Miller  EXB:[id number] DOB: 1945-08-06 DOA: 12/29/2020 PCP: [facility]   Brief Narrative: Dalton Miller is a 76 y.o. male with a history of metastatic melanoma, lung CA, urothelial CA, tobacco use, thrombocytopenia, right shoulder OA and impingement s/p surgery 12/17/20 who presented to the [facility] 12/27 found down, unresponsive by the toilet at home (unknown duration, LKW previous evening when he complained of upper abd pain and black stool). He required intubation in the ED, noted to have acute right MCA CVA on CT. OGT placed with >500cc coffee ground emesis, hgb 7.1g/dl. LFTs elevated. +SARS-CoV-2 PCR also noted on admission to ICU at [facility]. EGD performed 12/28 revealed esophageal varices which were banded. He received 1u PRBCs on 1/1. Ultimately he was weaned off pressor support and extubated on 1/3, transferred to floor on hospitalist service as of 1/5. GI reengaged, cleared for antiplatelet and statin initiation, among other recommendations, and have scheduled follow up as an outpatient on 1/31. Lactulose dose is being decreased with improved diarrhea, rifaximin added. Rectal tube and foley catheter removed 1/8. Hypoxic respiratory failure, GI bleeding, acute encephalopathy have all resolved. He has medically stabilized and the plan remains to pursue CIR at discharge.    Assessment & Plan: Principal Problem:   Acute respiratory failure with hypoxia (HCC) Active Problems:   CVA (cerebral vascular accident) ([place])   Gastrointestinal hemorrhage   Hyperammonemia (HCC)   Toxic metabolic encephalopathy   Jaundice   Abnormal LFTs   AKI (acute kidney injury) ([place])   Hypotension   COVID-19 virus infection   Leukocytosis   Thrombocytosis   Severe sepsis (HCC)   Hematemesis with nausea   Melena   Idiopathic esophageal varices with bleeding (HCC)   Alcoholic cirrhosis of liver with ascites (HCC)   Encephalopathy, hepatic  Acute blood loss anemia due to upper GI  bleeding due to esophageal varices: s/p banding x4 12/28, completed octreotide.   - Hgb 6.8 so an additional 1u PRBCs 1/1. Hgb 8.5, will continue monitoring with initiation of aspirin. No clinical evidence of bleeding since endoscopy. - Continue PPI BID - Follow up in place for 1/31 with Dr. Fuller Plan, with consideration of repeat EGD with possible banding.   Hepatic cirrhosis: By morphologic appearance on CT abd/pelvis. Also with ascites and changes consistent with portal HTN. - LFTs have improved steadily (normalized AP, AST, ALT), likely acutely worsened from sepsis/covid-19. At this level, no contraindication to initiating statin therapy, so started low dose atorvastatin with plans to increase as tolerated (by LFTs and clinically).  - Having ascites, pitting edema, would benefit from lasix/spiro, though renal function not back to baseline or seeming to have reached nadir. Will likely start lasix 20mg , spironolactone 50mg  once renal function optimized. No current evidence of SBP/discomfort, so not pursuing paracentesis. - 2g sodium restriction  Acute right MCA CVA affecting basal ganglia, right frontal lobe. Scattered cortical and subcortical acute infarcts in the right MCA distribution with mild petechial hemorrhage. MRA revealed 70% stenosis at the right M1 segment. - PT/OT to continue rehabilitation efforts at [facility]. - Stroke neurology signed off 12/30, recommending restarting statin (LDL 71) once LFTs improve and antiplatelet once bleeding subsided. Aspirin monotherapy initiated 1/7, 10 days after endoscopy.  - Will need neurology follow up in 6-8 weeks.   Acute hypoxic respiratory failure: Suspect this is due to aspiration pneumonia during admission that was only later seen on subsequent CT at [facility].  - s/p abx - Weaned from oxygen, no hypoxia  with ambulation. - Continue albuterol neb prn wheezing (improved), though predominance of current clinical picture is upper airway. He does have  tobacco hx.  Acute hepatic/metabolic encephalopathy: Multifactorial.  - Continue delirium precautions - Continue low dose lactulose. Discontinued rectal tube 1/8. - Added rifaximin 550mg  BID.    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 ?if related to liver disease. Oddly, had thrombocytosis on presentation here and platelets have remained normal. - Continue monitoring, not continuing promacta 25mg  daily.   AKI, solitary kidney s/p nephrectomy due to RCC: Previous baseline Cr appears to be ~1.0.  - SCr continues improving, down to 1.6, unclear where CrCl will settle. Will continue monitoring intermittently to minimize blood draws. Holding ACE and holding initiation of diuretics at this time.   Moderate protein calorie malnutrition:  - Encourage enteral intake   Hypernatremia: Resolved.  T2DM: HbA1c was 7.6% on 12/14/2020 (PTA), 6.2% during this admission (before transfusion) and has been <6.5% on multiple checks since 2016.  - PCP follow up recommended. Continue CBGs AC/HS to ensure tight glycemic control.   HTN:  - Holding lisinopril with AKI.  - Initial permissive HTN, started norvasc 5mg  > 10mg .  Severe sepsis with lactic acidosis: Resolved. Source remains unclear. Completed ceftriaxone x7 days as of 1/2.   Metastatic recurrent melanoma: LUL lesion, mediastinal adenopathy. s/p 4 cycles ipi/nivo.  - Outpatient follow up to continue - Note 2 new liver lesions on CT on Jan 03, 2021 > recommend further evaluation as outpatient with dedicated multiphase abdominal MRI w/contrast.  Covid-19 infection: Appears incidental. Was not able to take paxlovid or mulnupiravir due to LFT derangements or paxlovid due to NPO/OGT. Outside window of potential benefit and never needed steroids.  - Continued isolation x10 days from positive test (12/27). No longer requires isolation or treatment.  Recurrent low grade urothelial  carcinoma: s/p TURBT x3 in total.  - Continue outpatient follow up. Continue tamsulosin  DVT prophylaxis: SCDs Code Status: Full Family Communication: None at bedside Disposition Plan:  Status is: Inpatient. He is medically stable for discharge to CIR.  Consultants:  PCCM  GI  Procedures:  EGD 12/28 Dr. Fuller Plan:  Impression:        - Grade II esophageal varices. Completely                            eradicated. Banded.                           - Red blood clots in the gastric fundus.                           - Normal duodenal bulb and second portion of the                            duodenum.                           - No specimens collected.  Antimicrobials: Ceftriaxone x7 days   Subjective: Happy to have rectal tube and foley out. No wheezing today, no dyspnea. Working with therapy. No bleeding or abd pain.   Objective: Vitals:   01/10/21 2113 01/11/21 0003 01/11/21 0344 01/11/21 0902  BP: 131/85 138/82 128/76 (!) 144/78  Pulse: 81  78 73 87  Resp: 20 (!) 22 20 20   Temp: 97.9 F (36.6 C) 98.9 F (37.2 C) 98 F (36.7 C) (!) 97.4 F (36.3 C)  TempSrc: Oral Oral Oral Axillary  SpO2: 98% 97%  95%  Weight:      Height:        Intake/Output Summary (Last 24 hours) at 01/11/2021 1212 Last data filed at 01/11/2021 0300 Gross per 24 hour  Intake 190 ml  Output 800 ml  Net -610 ml   Filed Weights   01/05/21 0500 01/09/21 0457 01/10/21 0324  Weight: 86.6 kg 83.9 kg 85 kg   Gen: 76 y.o. male in no distress Pulm: Nonlabored breathing room air. Clear. CV: Regular rate and rhythm. No murmur, rub, or gallop. No JVD, stable dependent pitting edema. GI: Abdomen soft, non-tender, non-distended, with normoactive bowel sounds.  Ext: Warm, no deformities Skin: No new rashes, lesions or ulcers on visualized skin. Neuro: Alert and oriented. No asterixis or new focal neurological deficits. Psych: Judgement and insight appear fair. Mood euthymic & affect congruent. Behavior  is appropriate.    Data Reviewed: I have personally reviewed following labs and imaging studies  CBC: Recent Labs  Lab 01/05/21 0511 01/07/21 1234 01/08/21 0500 01/10/21 0327  WBC 10.1 12.2* 12.0* 11.5*  HGB 8.0* 9.9* 9.5* 8.5*  HCT 25.8* 31.4* 30.8* 26.6*  MCV 102.4* 102.3* 102.3* 101.5*  PLT 229 233 224 258   Basic Metabolic Panel: Recent Labs  Lab 01/04/21 1955 01/05/21 0511 01/05/21 0511 01/05/21 0703 01/05/21 1616 01/06/21 0600 01/07/21 1234 01/08/21 0500 01/10/21 0327  NA  --  148*   < > 149*  --  146* 145 146* 145  K  --  3.4*   < > 3.3*  --  3.7 4.1 3.7 3.7  CL  --  119*   < > 121*  --  120* 118* 119* 116*  CO2  --  18*   < > 20*  --  20* 20* 19* 20*  GLUCOSE  --  516*   < > 199*  --  180* 218* 147* 131*  BUN  --  67*   < > 73*  --  60* 49* 42* 28*  CREATININE  --  2.85*   < > 3.03*  --  2.47*   2.55* 2.08* 1.88* 1.60*  CALCIUM  --  7.4*   < > 7.3*  --  7.7* 8.0* 7.9* 7.7*  MG 1.7 1.7  --   --  2.6* 2.1  --   --   --   PHOS 3.5 3.7  --   --  4.0 4.0  --   --   --    < > = values in this interval not displayed.   GFR: Estimated Creatinine Clearance: 41.6 mL/min (A) (by C-G formula based on SCr of 1.6 mg/dL (H)). Liver Function Tests: Recent Labs  Lab 01/07/21 1234 01/10/21 0327  AST 30 32  ALT 64* 44  ALKPHOS 151* 121  BILITOT 1.4* 1.4*  PROT 5.0* 4.8*  ALBUMIN 2.1* 2.0*   No results for input(s): LIPASE, AMYLASE in the last 168 hours. No results for input(s): AMMONIA in the last 168 hours. Coagulation Profile: No results for input(s): INR, PROTIME in the last 168 hours.  Cardiac Enzymes: No results for input(s): CKTOTAL, CKMB, CKMBINDEX, TROPONINI in the last 168 hours. BNP (last 3 results) No results for input(s): PROBNP in the last 8760 hours. HbA1C: No results for input(s): HGBA1C in the  last 72 hours. CBG: Recent Labs  Lab 01/10/21 0604 01/10/21 1318 01/10/21 1656 01/10/21 2206 01/11/21 0606  GLUCAP 100* 176* 125* 180* 125*    Lipid Profile: No results for input(s): CHOL, HDL, LDLCALC, TRIG, CHOLHDL, LDLDIRECT in the last 72 hours. Thyroid Function Tests: No results for input(s): TSH, T4TOTAL, FREET4, T3FREE, THYROIDAB in the last 72 hours. Anemia Panel: No results for input(s): VITAMINB12, FOLATE, FERRITIN, TIBC, IRON, RETICCTPCT in the last 72 hours. Urine analysis:    Component Value Date/Time   COLORURINE YELLOW 12/29/2020 1012   APPEARANCEUR CLEAR 12/29/2020 1012   LABSPEC 1.015 12/29/2020 1012   PHURINE 5.5 12/29/2020 1012   GLUCOSEU 100 (A) 12/29/2020 1012   HGBUR NEGATIVE 12/29/2020 1012   BILIRUBINUR NEGATIVE 12/29/2020 1012   BILIRUBINUR negative 02/17/2015 1336   KETONESUR NEGATIVE 12/29/2020 1012   PROTEINUR NEGATIVE 12/29/2020 1012   UROBILINOGEN negative 02/17/2015 1336   UROBILINOGEN 0.2 01/27/2014 2028   NITRITE NEGATIVE 12/29/2020 1012   LEUKOCYTESUR NEGATIVE 12/29/2020 1012   No results found for this or any previous visit (from the past 240 hour(s)).     Radiology Studies: No results found.  Scheduled Meds:  amLODipine  10 mg Oral Daily   artificial tears   Both Eyes QHS   aspirin EC  81 mg Oral Daily   atorvastatin  10 mg Oral Daily   Chlorhexidine Gluconate Cloth  6 each Topical Daily   feeding supplement (GLUCERNA SHAKE)  237 mL Oral TID BM   Gerhardt's butt cream   Topical Q4H   insulin aspart  0-15 Units Subcutaneous TID WC   lactulose  10 g Oral Daily   multivitamin with minerals  1 tablet Oral Daily   pantoprazole  40 mg Oral BID   Ensure Max Protein  11 oz Oral Daily   rifaximin  550 mg Oral BID   sodium chloride flush  10-40 mL Intracatheter Q12H   Continuous Infusions:  sodium chloride Stopped (01/02/21 0856)     LOS: 13 days    Patrecia Pour, MD [facility] [email or web address] 01/11/2021, 12:12 PM

## 2021-01-12 ENCOUNTER — Encounter (HOSPITAL_COMMUNITY): Payer: Self-pay | Admitting: Physical Medicine and Rehabilitation

## 2021-01-12 ENCOUNTER — Encounter (HOSPITAL_COMMUNITY): Payer: No Typology Code available for payment source

## 2021-01-12 ENCOUNTER — Inpatient Hospital Stay (HOSPITAL_COMMUNITY)
Admission: RE | Admit: 2021-01-12 | Discharge: 2021-01-29 | DRG: 056 | Disposition: A | Discharge status: Home Health | Payer: No Typology Code available for payment source | Source: Intra-hospital | Attending: Physical Medicine and Rehabilitation | Admitting: Physical Medicine and Rehabilitation

## 2021-01-12 ENCOUNTER — Other Ambulatory Visit: Payer: Self-pay

## 2021-01-12 DIAGNOSIS — K922 Gastrointestinal hemorrhage, unspecified: Secondary | ICD-10-CM

## 2021-01-12 DIAGNOSIS — E119 Type 2 diabetes mellitus without complications: Secondary | ICD-10-CM | POA: Diagnosis present

## 2021-01-12 DIAGNOSIS — R32 Unspecified urinary incontinence: Secondary | ICD-10-CM | POA: Diagnosis present

## 2021-01-12 DIAGNOSIS — D62 Acute posthemorrhagic anemia: Secondary | ICD-10-CM | POA: Diagnosis present

## 2021-01-12 DIAGNOSIS — I63511 Cerebral infarction due to unspecified occlusion or stenosis of right middle cerebral artery: Secondary | ICD-10-CM | POA: Diagnosis present

## 2021-01-12 DIAGNOSIS — Z809 Family history of malignant neoplasm, unspecified: Secondary | ICD-10-CM

## 2021-01-12 DIAGNOSIS — K7460 Unspecified cirrhosis of liver: Secondary | ICD-10-CM | POA: Diagnosis present

## 2021-01-12 DIAGNOSIS — C7802 Secondary malignant neoplasm of left lung: Secondary | ICD-10-CM | POA: Diagnosis present

## 2021-01-12 DIAGNOSIS — Z8249 Family history of ischemic heart disease and other diseases of the circulatory system: Secondary | ICD-10-CM

## 2021-01-12 DIAGNOSIS — Z85528 Personal history of other malignant neoplasm of kidney: Secondary | ICD-10-CM

## 2021-01-12 DIAGNOSIS — G9341 Metabolic encephalopathy: Secondary | ICD-10-CM | POA: Diagnosis present

## 2021-01-12 DIAGNOSIS — Z823 Family history of stroke: Secondary | ICD-10-CM

## 2021-01-12 DIAGNOSIS — R1312 Dysphagia, oropharyngeal phase: Secondary | ICD-10-CM | POA: Diagnosis present

## 2021-01-12 DIAGNOSIS — E1142 Type 2 diabetes mellitus with diabetic polyneuropathy: Secondary | ICD-10-CM | POA: Diagnosis present

## 2021-01-12 DIAGNOSIS — Z7982 Long term (current) use of aspirin: Secondary | ICD-10-CM

## 2021-01-12 DIAGNOSIS — C439 Malignant melanoma of skin, unspecified: Secondary | ICD-10-CM

## 2021-01-12 DIAGNOSIS — I10 Essential (primary) hypertension: Secondary | ICD-10-CM | POA: Diagnosis present

## 2021-01-12 DIAGNOSIS — E785 Hyperlipidemia, unspecified: Secondary | ICD-10-CM | POA: Diagnosis present

## 2021-01-12 DIAGNOSIS — Z905 Acquired absence of kidney: Secondary | ICD-10-CM

## 2021-01-12 DIAGNOSIS — N179 Acute kidney failure, unspecified: Secondary | ICD-10-CM | POA: Diagnosis present

## 2021-01-12 DIAGNOSIS — R059 Cough, unspecified: Secondary | ICD-10-CM

## 2021-01-12 DIAGNOSIS — Z79899 Other long term (current) drug therapy: Secondary | ICD-10-CM

## 2021-01-12 DIAGNOSIS — I69354 Hemiplegia and hemiparesis following cerebral infarction affecting left non-dominant side: Secondary | ICD-10-CM

## 2021-01-12 DIAGNOSIS — Z833 Family history of diabetes mellitus: Secondary | ICD-10-CM

## 2021-01-12 DIAGNOSIS — Z85820 Personal history of malignant melanoma of skin: Secondary | ICD-10-CM

## 2021-01-12 DIAGNOSIS — Z6830 Body mass index (BMI) 30.0-30.9, adult: Secondary | ICD-10-CM

## 2021-01-12 DIAGNOSIS — G4700 Insomnia, unspecified: Secondary | ICD-10-CM | POA: Diagnosis present

## 2021-01-12 DIAGNOSIS — F17210 Nicotine dependence, cigarettes, uncomplicated: Secondary | ICD-10-CM | POA: Diagnosis present

## 2021-01-12 DIAGNOSIS — C680 Malignant neoplasm of urethra: Secondary | ICD-10-CM | POA: Diagnosis present

## 2021-01-12 DIAGNOSIS — J449 Chronic obstructive pulmonary disease, unspecified: Secondary | ICD-10-CM | POA: Diagnosis present

## 2021-01-12 DIAGNOSIS — F17290 Nicotine dependence, other tobacco product, uncomplicated: Secondary | ICD-10-CM | POA: Diagnosis present

## 2021-01-12 DIAGNOSIS — E669 Obesity, unspecified: Secondary | ICD-10-CM | POA: Diagnosis present

## 2021-01-12 DIAGNOSIS — Z96651 Presence of right artificial knee joint: Secondary | ICD-10-CM | POA: Diagnosis present

## 2021-01-12 DIAGNOSIS — I69322 Dysarthria following cerebral infarction: Principal | ICD-10-CM

## 2021-01-12 DIAGNOSIS — Z8616 Personal history of COVID-19: Secondary | ICD-10-CM

## 2021-01-12 LAB — GLUCOSE, CAPILLARY
Glucose-Capillary: 137 mg/dL — ABNORMAL HIGH (ref 70–99)
Glucose-Capillary: 106 mg/dL — ABNORMAL HIGH (ref 70–99)
Glucose-Capillary: 126 mg/dL — ABNORMAL HIGH (ref 70–99)
Glucose-Capillary: 161 mg/dL — ABNORMAL HIGH (ref 70–99)

## 2021-01-12 MED ORDER — ALBUTEROL SULFATE (2.5 MG/3ML) 0.083% IN NEBU: 2.5000 mg | INHALATION_SOLUTION | RESPIRATORY_TRACT | Status: DC | PRN

## 2021-01-12 MED ORDER — LACTULOSE 10 GM/15ML PO SOLN: 10.0000 g | Freq: Every day | ORAL | 0 refills | Status: DC

## 2021-01-12 MED ORDER — ASPIRIN EC 81 MG PO TBEC
81.0000 mg | DELAYED_RELEASE_TABLET | Freq: Every day | ORAL | Status: DC
  Administered 2021-01-13 – 2021-01-29 (×17): 81 mg via ORAL
  Filled 2021-01-12 (×17): qty 1

## 2021-01-12 MED ORDER — ENSURE MAX PROTEIN PO LIQD
11.0000 [oz_av] | Freq: Every day | ORAL | Status: DC
Start: 2021-01-12 — End: 2021-01-14
  Administered 2021-01-13 – 2021-01-14 (×2): 11 [oz_av] via ORAL
  Filled 2021-01-12 (×3): qty 330

## 2021-01-12 MED ORDER — AMLODIPINE BESYLATE 10 MG PO TABS
10.0000 mg | ORAL_TABLET | Freq: Every day | ORAL | Status: DC
  Administered 2021-01-13 – 2021-01-28 (×16): 10 mg via ORAL
  Filled 2021-01-12 (×16): qty 1

## 2021-01-12 MED ORDER — ARTIFICIAL TEARS OPHTHALMIC OINT
TOPICAL_OINTMENT | Freq: Every day | OPHTHALMIC | Status: DC
Start: 2021-01-12 — End: 2021-01-29
  Administered 2021-01-22: 1 via OPHTHALMIC
  Filled 2021-01-12: qty 3.5

## 2021-01-12 MED ORDER — GLUCERNA SHAKE PO LIQD
237.0000 mL | Freq: Three times a day (TID) | ORAL | Status: DC
  Administered 2021-01-13 – 2021-01-14 (×3): 237 mL via ORAL

## 2021-01-12 MED ORDER — LIVING WELL WITH DIABETES BOOK
Freq: Once | Status: AC
  Filled 2021-01-12 (×2): qty 1

## 2021-01-12 MED ORDER — LACTULOSE 10 GM/15ML PO SOLN
10.0000 g | Freq: Every day | ORAL | Status: DC
Start: 2021-01-12 — End: 2021-01-14
  Administered 2021-01-13: 10 g via ORAL
  Filled 2021-01-12 (×2): qty 15

## 2021-01-12 MED ORDER — PANTOPRAZOLE SODIUM 40 MG PO TBEC: 40.0000 mg | DELAYED_RELEASE_TABLET | Freq: Two times a day (BID) | ORAL | Status: DC

## 2021-01-12 MED ORDER — INSULIN ASPART 100 UNIT/ML IJ SOLN
0.0000 [IU] | Freq: Three times a day (TID) | INTRAMUSCULAR | Status: DC
  Administered 2021-01-12 – 2021-01-13 (×2): 2 [IU] via SUBCUTANEOUS
  Administered 2021-01-13: 1 [IU] via SUBCUTANEOUS
  Administered 2021-01-14: 2 [IU] via SUBCUTANEOUS
  Administered 2021-01-15 – 2021-01-16 (×3): 3 [IU] via SUBCUTANEOUS
  Administered 2021-01-16 – 2021-01-17 (×3): 2 [IU] via SUBCUTANEOUS
  Administered 2021-01-18: 3 [IU] via SUBCUTANEOUS
  Administered 2021-01-19: 2 [IU] via SUBCUTANEOUS
  Administered 2021-01-19: 3 [IU] via SUBCUTANEOUS
  Administered 2021-01-20 – 2021-01-21 (×2): 2 [IU] via SUBCUTANEOUS
  Administered 2021-01-22: 3 [IU] via SUBCUTANEOUS
  Administered 2021-01-23 – 2021-01-28 (×9): 2 [IU] via SUBCUTANEOUS

## 2021-01-12 MED ORDER — EXERCISE FOR HEART AND HEALTH BOOK
Freq: Once | Status: AC
  Filled 2021-01-12 (×2): qty 1

## 2021-01-12 MED ORDER — GERHARDT'S BUTT CREAM
TOPICAL_CREAM | CUTANEOUS | Status: DC
  Administered 2021-01-22 – 2021-01-24 (×5): 1 via TOPICAL
  Filled 2021-01-12 (×3): qty 1

## 2021-01-12 MED ORDER — ATORVASTATIN CALCIUM 10 MG PO TABS
10.0000 mg | ORAL_TABLET | Freq: Every day | ORAL | Status: DC
Start: 2021-01-12 — End: 2021-01-29
  Administered 2021-01-13 – 2021-01-29 (×17): 10 mg via ORAL
  Filled 2021-01-12 (×17): qty 1

## 2021-01-12 MED ORDER — PANTOPRAZOLE SODIUM 40 MG PO TBEC
40.0000 mg | DELAYED_RELEASE_TABLET | Freq: Two times a day (BID) | ORAL | Status: DC
  Administered 2021-01-12 – 2021-01-29 (×34): 40 mg via ORAL
  Filled 2021-01-12 (×34): qty 1

## 2021-01-12 MED ORDER — RIFAXIMIN 550 MG PO TABS: 550.0000 mg | ORAL_TABLET | Freq: Two times a day (BID) | ORAL | Status: DC

## 2021-01-12 MED ORDER — ADULT MULTIVITAMIN W/MINERALS CH
1.0000 | ORAL_TABLET | Freq: Every day | ORAL | Status: DC
Start: 2021-01-12 — End: 2021-01-29
  Administered 2021-01-13 – 2021-01-29 (×17): 1 via ORAL
  Filled 2021-01-12 (×18): qty 1

## 2021-01-12 MED ORDER — RIFAXIMIN 550 MG PO TABS
550.0000 mg | ORAL_TABLET | Freq: Two times a day (BID) | ORAL | Status: DC
  Administered 2021-01-12 – 2021-01-29 (×34): 550 mg via ORAL
  Filled 2021-01-12 (×36): qty 1

## 2021-01-12 MED ORDER — BLOOD PRESSURE CONTROL BOOK
Freq: Once | Status: AC
  Filled 2021-01-12 (×2): qty 1

## 2021-01-12 MED ORDER — AMLODIPINE BESYLATE 10 MG PO TABS: 10.0000 mg | ORAL_TABLET | Freq: Every day | ORAL | Status: DC

## 2021-01-12 NOTE — Discharge Summary (Signed)
Physician Discharge Summary  Barnett Elzey Grayer MBW:[id number] DOB: 1945-11-17 DOA: 12/29/2020  PCP: [facility]  Admit date: 12/29/2020 Discharge date: 01/12/2021  Admitted From: Home Disposition: CIR   Recommendations for Outpatient Follow-up:  Recommend monitoring CBC, CMP. Continue aspirin, statin for CVA. Await stability of renal function to initiate low dose diuretics for ascites due to cirrhosis. Also continue rifaximin for hepatic encephalopathy.  Follow up with GI as detailed below Recommend nonurgent MRI w/contrast to evaluate 2 liver lesions seen on CT  Home Health: N/A Equipment/Devices: N/A Discharge Condition: Stable CODE STATUS: Full Diet recommendation: 2g sodium restriction  Brief/Interim Summary: KARTHIKEYA FUNKE is a 76 y.o. male with a history of metastatic melanoma, lung CA, urothelial CA, tobacco use, thrombocytopenia, right shoulder OA and impingement s/p surgery 12/17/20 who presented to the [facility] 12/27 found down, unresponsive by the toilet at home (unknown duration, LKW previous evening when he complained of upper abd pain and black stool). He required intubation in the ED, noted to have acute right MCA CVA on CT. OGT placed with >500cc coffee ground emesis, hgb 7.1g/dl. LFTs elevated. +SARS-CoV-2 PCR also noted on admission to ICU at [facility]. EGD performed 12/28 revealed esophageal varices which were banded. He received 1u PRBCs on 1/1. Ultimately he was weaned off pressor support and extubated on 1/3, transferred to floor on hospitalist service as of 1/5. GI reengaged, cleared for antiplatelet and statin initiation, among other recommendations, and have scheduled follow up as an outpatient on 1/31. Lactulose dose is being decreased with improved diarrhea, rifaximin added. Rectal tube and foley catheter removed 1/8. Hypoxic respiratory failure, GI bleeding, acute encephalopathy have all resolved. He has medically stabilized and the plan remains to pursue CIR at discharge.     Discharge Diagnoses:  Principal Problem:   Acute respiratory failure with hypoxia ([place]) Active Problems:   CVA (cerebral vascular accident) ([place])   Gastrointestinal hemorrhage   Hyperammonemia (HCC)   Toxic metabolic encephalopathy   Jaundice   Abnormal LFTs   AKI (acute kidney injury) ([place])   Hypotension   COVID-19 virus infection   Leukocytosis   Thrombocytosis   Severe sepsis (HCC)   Hematemesis with nausea   Melena   Idiopathic esophageal varices with bleeding (HCC)   Alcoholic cirrhosis of liver with ascites (HCC)   Encephalopathy, hepatic  Acute blood loss anemia due to upper GI bleeding due to esophageal varices: s/p banding x4 12/28, completed octreotide.   - Hgb 6.8 so an additional 1u PRBCs 1/1. Hgb 8.5, recommend continue monitoring with initiation of aspirin. No clinical evidence of bleeding since endoscopy. - Continue PPI BID - Follow up in place for 1/31 with Dr. Fuller Plan, with consideration of repeat EGD with possible banding.    Hepatic cirrhosis: By morphologic appearance on CT abd/pelvis. Also with ascites and changes consistent with portal HTN. - LFTs have improved steadily (normalized AP, AST, ALT), likely acutely worsened from sepsis/covid-19. At this level, no contraindication to initiating statin therapy, so started low dose atorvastatin with plans to increase as tolerated (by LFTs and clinically).  - Having ascites, pitting edema, would benefit from lasix/spiro, though renal function not back to baseline or seeming to have reached nadir. Will likely start lasix 20mg , spironolactone 50mg  once renal function optimized. No current evidence of SBP/discomfort, so not pursuing paracentesis. - 2g sodium restriction   Acute right MCA CVA affecting basal ganglia, right frontal lobe. Scattered cortical and subcortical acute infarcts in the right MCA distribution with mild petechial hemorrhage. MRA  revealed 70% stenosis at the right M1 segment. - PT/OT to continue  rehabilitation efforts at [facility]. - Stroke neurology signed off 12/30, recommending restarting statin (LDL 71) once LFTs improve and antiplatelet once bleeding subsided. Aspirin monotherapy initiated 1/7, 10 days after endoscopy.  - Will need neurology follow up in 6-8 weeks.    Acute hypoxic respiratory failure: Suspect this is due to aspiration pneumonia during admission that was only later seen on subsequent CT at lung bases.  - s/p abx - Weaned from oxygen, no hypoxia with ambulation. - Continue albuterol neb prn wheezing (improved), though predominance of current clinical picture is upper airway. He does have tobacco hx.   Acute hepatic/metabolic encephalopathy: Multifactorial.  - Continue delirium precautions - Dose of lactulose has been decreased, having 2 BMs per day. Discontinued rectal tube 1/8. - Added rifaximin 550mg  BID.     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 ?if related to liver disease. Oddly, had thrombocytosis on presentation here and platelets have remained normal. - Continue monitoring, not continuing promacta 25mg  daily.    AKI, solitary kidney s/p nephrectomy due to RCC: Previous baseline Cr appears to be ~1.0.  - SCr continues improving, down to 1.6, unclear where CrCl will settle. Will continue monitoring intermittently to minimize blood draws. Holding ACE and holding initiation of diuretics at this time.    Moderate protein calorie malnutrition:  - Encourage enteral intake    Hypernatremia: Resolved.   T2DM: HbA1c was 7.6% on 12/14/2020 (PTA), 6.2% during this admission (before transfusion) and has been <6.5% on multiple checks since 2016.  - PCP follow up recommended. Remained at inpatient goal with minimal SSI.     HTN:  - Holding lisinopril with AKI.  - Initial permissive HTN, started norvasc 5mg  >  10mg .   Severe sepsis with lactic acidosis: Resolved. Source remains unclear. Completed ceftriaxone x7 days as of 1/2.    Metastatic recurrent melanoma: LUL lesion, mediastinal adenopathy. s/p 4 cycles ipi/nivo.  - Outpatient follow up to continue - Note 2 new liver lesions on CT on Jan 03, 2021 > recommend further evaluation as outpatient with dedicated multiphase abdominal MRI w/contrast.   Covid-19 infection: Appears incidental. Was not able to take paxlovid or mulnupiravir due to LFT derangements or paxlovid due to NPO/OGT. Outside window of potential benefit and never needed steroids.  - Continued isolation x10 days from positive test (12/27). No longer requires isolation or treatment.   Recurrent low grade urothelial carcinoma: s/p TURBT x3 in total.  - Continue outpatient follow up. Continue tamsulosin  Obesity: Estimated body mass index is 30.25 kg/m as calculated from the following:   Height as of this encounter: 5\' 7"  (1.702 m).   Weight as of this encounter: 87.6 kg.  Discharge Instructions  Allergies as of 01/12/2021   No Known Allergies      Medication List     STOP taking these medications    eltrombopag 25 MG tablet Commonly known as: PROMACTA   fluticasone 50 MCG/ACT nasal spray Commonly known as: FLONASE   lisinopril 10 MG tablet Commonly known as: ZESTRIL   naproxen 500 MG tablet Commonly known as: Naprosyn   oxyCODONE 5 MG immediate release tablet Commonly known as: Oxy IR/ROXICODONE   oxyCODONE-acetaminophen 5-325 MG tablet Commonly known as: Percocet       TAKE these medications    albuterol (2.5 MG/3ML)  0.083% nebulizer solution Commonly known as: PROVENTIL Take 3 mLs (2.5 mg total) by nebulization every 3 (three) hours as needed for wheezing or shortness of breath.   amLODipine 10 MG tablet Commonly known as: NORVASC Take 1 tablet (10 mg total) by mouth daily. What changed:  medication strength how much to take   aspirin EC 81 MG  tablet Take 1 tablet (81 mg total) by mouth daily.   atorvastatin 10 MG tablet Commonly known as: LIPITOR Take 1 tablet (10 mg total) by mouth daily.   carboxymethylcellulose 0.5 % Soln Commonly known as: REFRESH PLUS Place 1 drop into both eyes in the morning, at noon, and at bedtime.   cyclobenzaprine 10 MG tablet Commonly known as: FLEXERIL Take 1 tablet (10 mg total) by mouth 3 (three) times daily as needed for muscle spasms.   lactulose 10 GM/15ML solution Commonly known as: CHRONULAC Take 15 mLs (10 g total) by mouth daily. Start taking on: January 13, 2021   loratadine 10 MG tablet Commonly known as: CLARITIN Take 10 mg by mouth daily.   ondansetron 4 MG tablet Commonly known as: Zofran Take 1 tablet (4 mg total) by mouth every 8 (eight) hours as needed for nausea or vomiting.   pantoprazole 40 MG tablet Commonly known as: PROTONIX Take 1 tablet (40 mg total) by mouth 2 (two) times daily.   pregabalin 75 MG capsule Commonly known as: LYRICA Take 75 mg by mouth 2 (two) times daily.   REFRESH P.M. OP Place 1 application into both eyes at bedtime.   rifaximin 550 MG Tabs tablet Commonly known as: XIFAXAN Take 1 tablet (550 mg total) by mouth 2 (two) times daily.        Follow-up Information     [facility]. Schedule an appointment as soon as possible for a visit in 1 month(s).   Specialty: Neurology Why: stroke clinic Contact information: [place] [phone]               No Known Allergies  Consultations: PCCM GI EGD 12/28 Dr. Fuller Plan:  Impression:        - Grade II esophageal varices. Completely                            eradicated. Banded.                           - Red blood clots in the gastric fundus.                           - Normal duodenal bulb and second portion of the                            duodenum.                           - No specimens  collected.  Procedures/Studies: CT ABDOMEN PELVIS WO CONTRAST  Result Date: 01/03/2021 CLINICAL DATA:  Portal hypertension suspected.  Suspect cirrhosis. EXAM: CT ABDOMEN AND PELVIS WITHOUT CONTRAST TECHNIQUE: Multidetector CT imaging of the abdomen and pelvis was performed following the standard protocol without IV contrast. COMPARISON:  04/04/2019 FINDINGS: Lower chest: Small bilateral pleural effusions. Airspace consolidation is identified within both lower lobes, right greater than  left. Hepatobiliary: 9 mm low-attenuation structure within the lateral dome of liver is identified, image 11/3. This is a new finding when compared with the previous exam. Tiny low-density structure within the posterolateral dome of liver measuring less than 1 cm is also noted and appears new compared with the previous exam, image 10/3. Inferior and lateral margin of the right hepatic lobe a Spears slightly irregular which may reflect changes of early cirrhosis. Mild edema of the gallbladder wall is identified, image 25/3. Nonspecific in the setting of ascites. No calcified gallstones noted. High attenuation material within the gallbladder may reflect gallbladder sludge. Common bile duct is upper limits of normal measuring 7 mm. Pancreas: Unremarkable. No pancreatic ductal dilatation or surrounding inflammatory changes. Spleen: The spleen measures 13 by 10.4 x 4.5 cm (volume = 320 cm^3). Adrenals/Urinary Tract: Normal right adrenal gland. Unchanged appearance of low-attenuation enlargement of the left adrenal gland, image 25/3. This is similar to 07/02/2015 and is compatible with underlying benign adenomas. Status post left nephrectomy. Right kidney cysts are again noted. The largest arises off the posterior cortex of the inferior pole measuring 2.6 cm. No right-sided hydronephrosis or nephrolithiasis. Urinary bladder is decompressed around a Foley catheter. Stomach/Bowel: Stomach is nondistended. The wall of the gastric antrum  appears thickened measuring 1.8 cm, image 22/3. The appendix is not confidently identified. There are scattered areas of bowel wall thickening including the descending colon, proximal sigmoid colon and rectum. Findings are nonspecific in the setting of ascites and suspected portal venous hypertension. Differential considerations include portal hypertensive gastroenterocolopathy as well as inflammatory/infectious colitis. Vascular/Lymphatic: Aortic atherosclerosis. There is no abdominopelvic adenopathy Reproductive: Prostate is unremarkable. Other: Moderate volume of ascites is identified within the abdomen and pelvis. No discrete fluid collections identified. Musculoskeletal: Mild spondylosis within the thoracolumbar spine. Diffuse body wall edema is noted compatible with anasarca. IMPRESSION: 1. Morphologic features of the liver suggestive of early cirrhosis. There is a moderate volume of ascites within the abdomen and pelvis. 2. There are 2 new low-attenuation structures within the dome of liver. These are new when compared with CT from 04/04/2019. Cannot exclude benign or malignant liver lesions. When the patient is clinically stable and able to follow directions and hold their breath (preferably as an outpatient) further evaluation with dedicated multiphase contrast enhanced abdominal MRI should be considered. 3. Wall thickening of the gastric antrum is identified. Additionally, there are several areas of nonspecific bowel wall edema involving the left colon, sigmoid colon and rectum. Nonspecific in the setting of portal venous hypertension. Findings may reflect inflammatory/infectious colitis versus portal hypertensive gastroenterocolopathy. Clinical correlation advised. 4. Bilateral lower lobe airspace consolidation is noted, right greater than left. Suspicious for pneumonia and or aspiration. 5. Small bilateral pleural effusions. 6. Aortic Atherosclerosis (ICD10-I70.0). Electronically Signed   By: Kerby Moors M.D.   On: 01/03/2021 09:10   DG Abd 1 View  Result Date: 01/04/2021 CLINICAL DATA:  Feeding tube placement. EXAM: ABDOMEN - 1 VIEW COMPARISON:  CT abdomen and pelvis 01/03/2021. FINDINGS: Intraoperative feeding tube placement. 1 low resolution intraoperative spot views of the upper abdomen were obtained. Feeding tube was placed with distal tip near the duodenal bulb. Total fluoroscopy time: 54 seconds Total radiation dose: Not recorded IMPRESSION: Feeding tube placement as above. Electronically Signed   By: Ronney Asters M.D.   On: 01/04/2021 16:18   CT Head Wo Contrast  Result Date: 12/29/2020 CLINICAL DATA:  Found unresponsive on the floor this morning. EXAM: CT HEAD WITHOUT CONTRAST CT CERVICAL  SPINE WITHOUT CONTRAST TECHNIQUE: Multidetector CT imaging of the head and cervical spine was performed following the standard protocol without intravenous contrast. Multiplanar CT image reconstructions of the cervical spine were also generated. COMPARISON:  None. FINDINGS: CT HEAD FINDINGS Brain: Ill-defined hypodensity involving the right basal ganglia and right frontal lobe, concerning for acute infarct. No hemorrhage, hydrocephalus, extra-axial collection or mass lesion/mass effect. Mild generalized cerebral atrophy. Scattered mild periventricular and subcortical white matter hypodensities are nonspecific, but favored to reflect chronic microvascular ischemic changes. Vascular: Calcified atherosclerosis at the skull base. No hyperdense vessel. Skull: Normal. Negative for fracture or focal lesion. Sinuses/Orbits: No acute finding. Other: None. CT CERVICAL SPINE FINDINGS Alignment: Trace retrolisthesis at C3-C4. Skull base and vertebrae: No acute fracture. No primary bone lesion or focal pathologic process. Soft tissues and spinal canal: No prevertebral fluid or swelling. No visible canal hematoma. Disc levels: Solid osseous fusion at C4-C5. Moderate to severe disc height loss and uncovertebral  hypertrophy at C3-C4, C5-C6, and C6-C7. Upper chest: Centrilobular emphysema. Other: Endotracheal and enteric tubes noted. IMPRESSION: 1. Ill-defined hypodensity involving the right basal ganglia and right frontal lobe, concerning for acute right MCA territory infarct. 2. No acute cervical spine fracture or traumatic listhesis. Moderate to severe degenerative changes throughout the cervical spine. 3. Emphysema (ICD10-J43.9). These results were called by telephone at the time of interpretation on 12/29/2020 at 10:09 am to provider [facility] , who verbally acknowledged these results. Electronically Signed   By: Titus Dubin M.D.   On: 12/29/2020 10:13   CT Cervical Spine Wo Contrast  Result Date: 12/29/2020 CLINICAL DATA:  Found unresponsive on the floor this morning. EXAM: CT HEAD WITHOUT CONTRAST CT CERVICAL SPINE WITHOUT CONTRAST TECHNIQUE: Multidetector CT imaging of the head and cervical spine was performed following the standard protocol without intravenous contrast. Multiplanar CT image reconstructions of the cervical spine were also generated. COMPARISON:  None. FINDINGS: CT HEAD FINDINGS Brain: Ill-defined hypodensity involving the right basal ganglia and right frontal lobe, concerning for acute infarct. No hemorrhage, hydrocephalus, extra-axial collection or mass lesion/mass effect. Mild generalized cerebral atrophy. Scattered mild periventricular and subcortical white matter hypodensities are nonspecific, but favored to reflect chronic microvascular ischemic changes. Vascular: Calcified atherosclerosis at the skull base. No hyperdense vessel. Skull: Normal. Negative for fracture or focal lesion. Sinuses/Orbits: No acute finding. Other: None. CT CERVICAL SPINE FINDINGS Alignment: Trace retrolisthesis at C3-C4. Skull base and vertebrae: No acute fracture. No primary bone lesion or focal pathologic process. Soft tissues and spinal canal: No prevertebral fluid or swelling. No visible canal hematoma.  Disc levels: Solid osseous fusion at C4-C5. Moderate to severe disc height loss and uncovertebral hypertrophy at C3-C4, C5-C6, and C6-C7. Upper chest: Centrilobular emphysema. Other: Endotracheal and enteric tubes noted. IMPRESSION: 1. Ill-defined hypodensity involving the right basal ganglia and right frontal lobe, concerning for acute right MCA territory infarct. 2. No acute cervical spine fracture or traumatic listhesis. Moderate to severe degenerative changes throughout the cervical spine. 3. Emphysema (ICD10-J43.9). These results were called by telephone at the time of interpretation on 12/29/2020 at 10:09 am to provider [facility] , who verbally acknowledged these results. Electronically Signed   By: Titus Dubin M.D.   On: 12/29/2020 10:13   MR ANGIO HEAD WO CONTRAST  Result Date: 12/31/2020 CLINICAL DATA:  Stroke follow up. EXAM: MRI HEAD WITHOUT CONTRAST MRA HEAD WITHOUT CONTRAST TECHNIQUE: Multiplanar, multi-echo pulse sequences of the brain and surrounding structures were acquired without intravenous contrast. Angiographic images of the Circle of  [place] were acquired using MRA technique without intravenous contrast. COMPARISON:  2 days ago. FINDINGS: MRI HEAD FINDINGS Brain: Patchy restricted diffusion along the right cerebral convexity involving the frontal, parietal, and occipital cortex. Restricted diffusion and swelling at the right stratum with mild petechial hemorrhage. Mild chronic small vessel ischemia in the hemispheric white matter no hydrocephalus, shift, or collection. Vascular: See below. Skull and upper cervical spine: Normal marrow signal Sinuses/Orbits: Minor mastoid and sinus opacification in the setting of nasopharyngeal fluid. MRA HEAD FINDINGS Anterior circulation: Atheromatous irregularity of the cavernous carotids. 70% right M1 segment stenosis with wasted appearance rather than the appearance of central clot/embolism. No beading or aneurysm. Posterior circulation: The  vertebral and basilar arteries are smoothly contoured and widely patent. Proximal basilar fenestration. IMPRESSION: 1. Scattered cortical and subcortical acute infarcts in the right MCA distribution with mild petechial hemorrhage. 2. 70% stenosis at the right M1 segment. Electronically Signed   By: Jorje Guild M.D.   On: 12/31/2020 07:12   MR BRAIN WO CONTRAST  Result Date: 12/31/2020 CLINICAL DATA:  Stroke follow up. EXAM: MRI HEAD WITHOUT CONTRAST MRA HEAD WITHOUT CONTRAST TECHNIQUE: Multiplanar, multi-echo pulse sequences of the brain and surrounding structures were acquired without intravenous contrast. Angiographic images of the Circle of [place] were acquired using MRA technique without intravenous contrast. COMPARISON:  2 days ago. FINDINGS: MRI HEAD FINDINGS Brain: Patchy restricted diffusion along the right cerebral convexity involving the frontal, parietal, and occipital cortex. Restricted diffusion and swelling at the right stratum with mild petechial hemorrhage. Mild chronic small vessel ischemia in the hemispheric white matter no hydrocephalus, shift, or collection. Vascular: See below. Skull and upper cervical spine: Normal marrow signal Sinuses/Orbits: Minor mastoid and sinus opacification in the setting of nasopharyngeal fluid. MRA HEAD FINDINGS Anterior circulation: Atheromatous irregularity of the cavernous carotids. 70% right M1 segment stenosis with wasted appearance rather than the appearance of central clot/embolism. No beading or aneurysm. Posterior circulation: The vertebral and basilar arteries are smoothly contoured and widely patent. Proximal basilar fenestration. IMPRESSION: 1. Scattered cortical and subcortical acute infarcts in the right MCA distribution with mild petechial hemorrhage. 2. 70% stenosis at the right M1 segment. Electronically Signed   By: Jorje Guild M.D.   On: 12/31/2020 07:12   DG Chest Port 1 View  Result Date: 01/02/2021 CLINICAL DATA:  Respiratory  failure with hypoxia. Altered mental status. EXAM: PORTABLE CHEST 1 VIEW COMPARISON:  01/01/2021 FINDINGS: The ETT tip is stable above the carina. Interval placement of left arm PICC line with tip in the cavoatrial junction. Stable cardiomediastinal contours. The lung volumes are low and there is atelectasis noted within both lung bases. IMPRESSION: 1. Satisfactory position of left arm PICC line. 2. Low lung volumes with bibasilar atelectasis. Electronically Signed   By: Kerby Moors M.D.   On: 01/02/2021 12:54   DG Chest Port 1 View  Result Date: 01/01/2021 CLINICAL DATA:  Acute respiratory failure; COVID positive EXAM: PORTABLE CHEST 1 VIEW COMPARISON:  12/31/2020 FINDINGS: Endotracheal tube is unchanged. Persistent opacity at the right lung base with change in configuration. No pleural effusion or pneumothorax. Stable cardiomediastinal contours. IMPRESSION: Right basilar atelectasis/consolidation. Electronically Signed   By: Macy Mis M.D.   On: 01/01/2021 08:08   DG Chest Port 1 View  Result Date: 12/31/2020 CLINICAL DATA:  Intubation, CVA EXAM: PORTABLE CHEST 1 VIEW COMPARISON:  Chest x-ray 12/29/2020 FINDINGS: Endotracheal tube tip is 4.7 cm above the carina. Enteric tube has been removed. Linear opacities in the right  lower lung zone likely represent subsegmental atelectasis. No pleural effusion or pneumothorax identified. IMPRESSION: 1. Medical devices as described. 2. Linear opacities in the right lower lung zone likely represent subsegmental atelectasis. Electronically Signed   By: Ofilia Neas M.D.   On: 12/31/2020 08:50   DG CHEST PORT 1 VIEW  Result Date: 12/29/2020 CLINICAL DATA:  Intubated EXAM: PORTABLE CHEST 1 VIEW COMPARISON:  12/29/2020 FINDINGS: Single frontal view of the chest demonstrates endotracheal tube overlying tracheal air column tip midway between thoracic inlet and carina. Enteric catheter passes below diaphragm, tip excluded by collimation. Side port projects  over gastric fundus. Cardiac silhouette is unremarkable. Diffuse interstitial prominence throughout the lungs without focal consolidation, effusion, or pneumothorax. No acute bony abnormalities. IMPRESSION: 1. Support devices as above. 2. Continued interstitial prominence, which may reflect mild interstitial edema. No airspace disease. Electronically Signed   By: Randa Ngo M.D.   On: 12/29/2020 23:37   DG Chest Port 1 View  Result Date: 12/29/2020 CLINICAL DATA:  Mental status change.  Unresponsive. EXAM: PORTABLE CHEST 1 VIEW COMPARISON:  01/21/2019 FINDINGS: Endotracheal tube with the tip 5.8 cm above the carina. Nasogastric tube with the tip projecting over the mid thoracic of esophagus. Recommend advancing the tube 20 cm. No focal consolidation. No pleural effusion or pneumothorax. Heart and mediastinal contours are unremarkable. No acute osseous abnormality. IMPRESSION: Endotracheal tube with the tip 5.8 cm above the carina. Nasogastric tube with the tip projecting over the mid thoracic of esophagus. Recommend advancing the tube 20 cm. Electronically Signed   By: Kathreen Devoid M.D.   On: 12/29/2020 10:00   ECHOCARDIOGRAM COMPLETE  Result Date: 12/30/2020    ECHOCARDIOGRAM REPORT   Patient Name:   THADD APUZZO Date of Exam: 12/30/2020 Medical Rec #:  [id number]    Height:       67.0 in Accession #:    [id number]   Weight:       170.0 lb Date of Birth:  [birth year]     BSA:          1.887 m Patient Age:    76 years     BP:           102/63 mmHg Patient Gender: M            HR:           89 bpm. Exam Location:  Inpatient Procedure: 2D Echo, Cardiac Doppler, Color Doppler and Intracardiac            Opacification Agent Indications:    Stroke  History:        Patient has no prior history of Echocardiogram examinations.  Sonographer:    Jyl Heinz Referring Phys: [id number]  1. Left ventricular ejection fraction, by estimation, is 50 to 55%. The left ventricle has low normal  function. The left ventricle has no regional wall motion abnormalities. Left ventricular diastolic parameters are consistent with Grade I diastolic dysfunction (impaired relaxation).  2. Right ventricular systolic function is normal. The right ventricular size is normal. There is normal pulmonary artery systolic pressure.  3. The mitral valve is normal in structure. Trivial mitral valve regurgitation. No evidence of mitral stenosis.  4. The aortic valve is tricuspid. There is mild calcification of the aortic valve. There is mild thickening of the aortic valve. Aortic valve regurgitation is not visualized. Aortic valve sclerosis is present, with no evidence of aortic valve stenosis.  5. The inferior vena cava is normal  in size with <50% respiratory variability, suggesting right atrial pressure of 8 mmHg. FINDINGS  Left Ventricle: Left ventricular ejection fraction, by estimation, is 50 to 55%. The left ventricle has low normal function. The left ventricle has no regional wall motion abnormalities. The left ventricular internal cavity size was normal in size. There is no left ventricular hypertrophy. Left ventricular diastolic parameters are consistent with Grade I diastolic dysfunction (impaired relaxation). Normal left ventricular filling pressure. Right Ventricle: The right ventricular size is normal. No increase in right ventricular wall thickness. Right ventricular systolic function is normal. There is normal pulmonary artery systolic pressure. The tricuspid regurgitant velocity is 1.55 m/s, and  with an assumed right atrial pressure of 8 mmHg, the estimated right ventricular systolic pressure is 70.6 mmHg. Left Atrium: Left atrial size was normal in size. Right Atrium: Right atrial size was normal in size. Pericardium: There is no evidence of pericardial effusion. Mitral Valve: The mitral valve is normal in structure. Trivial mitral valve regurgitation. No evidence of mitral valve stenosis. Tricuspid Valve: The  tricuspid valve is normal in structure. Tricuspid valve regurgitation is trivial. No evidence of tricuspid stenosis. Aortic Valve: The aortic valve is tricuspid. There is mild calcification of the aortic valve. There is mild thickening of the aortic valve. Aortic valve regurgitation is not visualized. Aortic valve sclerosis is present, with no evidence of aortic valve stenosis. Aortic valve peak gradient measures 10.4 mmHg. Pulmonic Valve: The pulmonic valve was normal in structure. Pulmonic valve regurgitation is not visualized. No evidence of pulmonic stenosis. Aorta: The aortic root is normal in size and structure. Venous: The inferior vena cava is normal in size with less than 50% respiratory variability, suggesting right atrial pressure of 8 mmHg. IAS/Shunts: No atrial level shunt detected by color flow Doppler.  LEFT VENTRICLE PLAX 2D LVIDd:         4.40 cm     Diastology LVIDs:         2.70 cm     LV e' medial:    4.13 cm/s LV PW:         0.80 cm     LV E/e' medial:  14.7 LV IVS:        0.80 cm     LV e' lateral:   7.07 cm/s LVOT diam:     2.00 cm     LV E/e' lateral: 8.6 LV SV:         46 LV SV Index:   25 LVOT Area:     3.14 cm  LV Volumes (MOD) LV vol d, MOD A2C: 76.9 ml LV vol d, MOD A4C: 89.2 ml LV vol s, MOD A2C: 31.9 ml LV vol s, MOD A4C: 42.6 ml LV SV MOD A2C:     45.0 ml LV SV MOD A4C:     89.2 ml LV SV MOD BP:      44.9 ml RIGHT VENTRICLE             IVC RV Basal diam:  2.80 cm     IVC diam: 1.70 cm RV Mid diam:    2.00 cm RV S prime:     16.30 cm/s TAPSE (M-mode): 1.9 cm LEFT ATRIUM             Index        RIGHT ATRIUM           Index LA diam:        2.10 cm 1.11 cm/m   RA Area:  11.20 cm LA Vol (A2C):   33.2 ml 17.59 ml/m  RA Volume:   23.60 ml  12.51 ml/m LA Vol (A4C):   24.3 ml 12.88 ml/m LA Biplane Vol: 29.3 ml 15.53 ml/m  AORTIC VALVE AV Area (Vmax): 1.89 cm AV Vmax:        161.00 cm/s AV Peak Grad:   10.4 mmHg LVOT Vmax:      97.00 cm/s LVOT Vmean:     69.600 cm/s LVOT VTI:        0.148 m  AORTA Ao Root diam: 3.00 cm Ao Asc diam:  3.20 cm MITRAL VALVE               TRICUSPID VALVE MV Area (PHT): 3.05 cm    TR Peak grad:   9.6 mmHg MV Decel Time: 249 msec    TR Vmax:        155.00 cm/s MV E velocity: 60.90 cm/s MV A velocity: 63.60 cm/s  SHUNTS MV E/A ratio:  0.96        Systemic VTI:  0.15 m                            Systemic Diam: 2.00 cm Skeet Latch MD Electronically signed by Skeet Latch MD Signature Date/Time: 12/30/2020/1:34:13 PM    Final    VAS US CAROTID  Result Date: 12/30/2020 Carotid Arterial Duplex Study Patient Name:  STRIDER VALLANCE  Date of Exam:   12/30/2020 Medical Rec #: [id number]     Accession #:    [id number] Date of Birth: [birth year]      Patient Gender: M Patient Age:   38 years Exam Location:  [facility] Procedure:      VAS US CAROTID Referring Phys: Rosalin Hawking --------------------------------------------------------------------------------  Indications:       CVA and altered mental status after a fall at home. Risk Factors:      Hypertension, hyperlipidemia, Diabetes. Comparison Study:  No prior. Performing Technologist: Oda Cogan RDMS, RVT  Examination Guidelines: A complete evaluation includes B-mode imaging, spectral Doppler, color Doppler, and power Doppler as needed of all accessible portions of each vessel. Bilateral testing is considered an integral part of a complete examination. Limited examinations for reoccurring indications may be performed as noted.  Right Carotid Findings: +----------+--------+--------+--------+------------------+--------+             PSV cm/s EDV cm/s Stenosis Plaque Description Comments  +----------+--------+--------+--------+------------------+--------+  CCA Prox   99       27                                             +----------+--------+--------+--------+------------------+--------+  CCA Distal 89       31                                              +----------+--------+--------+--------+------------------+--------+  ICA Prox   120      43       40-59%   calcific                     +----------+--------+--------+--------+------------------+--------+  ICA Mid    140      57  40-59%   calcific                     +----------+--------+--------+--------+------------------+--------+  ICA Distal 136      53                                             +----------+--------+--------+--------+------------------+--------+ +----------+--------+-------+----------------+-------------------+             PSV cm/s EDV cms Describe         Arm Pressure (mmHG)  +----------+--------+-------+----------------+-------------------+  Subclavian 98               Multiphasic, WNL                      +----------+--------+-------+----------------+-------------------+ +---------+--------+---+--------+--+---------+  Vertebral PSV cm/s 109 EDV cm/s 32 Antegrade  +---------+--------+---+--------+--+---------+  Left Carotid Findings: +----------+--------+--------+--------+------------------+--------+             PSV cm/s EDV cm/s Stenosis Plaque Description Comments  +----------+--------+--------+--------+------------------+--------+  CCA Prox   98       25                                             +----------+--------+--------+--------+------------------+--------+  CCA Distal 88       24                                             +----------+--------+--------+--------+------------------+--------+  ICA Prox   213      73       60-79%   heterogenous                 +----------+--------+--------+--------+------------------+--------+  ICA Mid    148      55       40-59%   heterogenous                 +----------+--------+--------+--------+------------------+--------+  ICA Distal 115      49                                             +----------+--------+--------+--------+------------------+--------+  ECA        107      21                                              +----------+--------+--------+--------+------------------+--------+ +----------+--------+--------+----------------+-------------------+             PSV cm/s EDV cm/s Describe         Arm Pressure (mmHG)  +----------+--------+--------+----------------+-------------------+  Subclavian 134               Multiphasic, WNL                      +----------+--------+--------+----------------+-------------------+ +---------+--------+--+--------+--+---------+  Vertebral PSV cm/s 89 EDV cm/s 25 Antegrade  +---------+--------+--+--------+--+---------+   Summary: Right Carotid: Velocities in the right ICA are consistent with a 40-59%  stenosis. Left Carotid: Velocities in the left ICA are consistent with a 60-79% stenosis. Vertebrals:  Bilateral vertebral arteries demonstrate antegrade flow. Subclavians: Normal flow hemodynamics were seen in bilateral subclavian              arteries. *See table(s) above for measurements and observations.  Electronically signed by Jamelle Haring on 12/30/2020 at 3:19:41 PM.    Final    [place] EKG SITE RITE  Result Date: 01/01/2021 If Site Rite image not attached, placement could not be confirmed due to current cardiac rhythm.  US Abdomen Limited RUQ (LIVER/GB)  Result Date: 12/30/2020 CLINICAL DATA:  History of ETT. EXAM: ULTRASOUND ABDOMEN LIMITED RIGHT UPPER QUADRANT COMPARISON:  March 13, 2010 FINDINGS: Gallbladder: No gallstones are visualized. The gallbladder wall measures 4.24 mm in thickness. No sonographic Murphy sign noted by sonographer (it should be noted that the patient is intubated). Common bile duct: Diameter: 4.23 mm Liver: No focal lesion identified. Within normal limits in parenchymal echogenicity. Portal vein is patent on color Doppler imaging with normal direction of blood flow towards the liver. Other: A 2.7 cm x 2.0 cm x 2.1 cm anechoic structure is seen within the lower pole of the right kidney. No abnormal flow is noted within this region on color  Doppler evaluation. IMPRESSION: 1. Gallbladder wall thickening without evidence of cholelithiasis or additional findings to suggest the presence of acute cholecystitis. 2. Simple right renal cyst. Electronically Signed   By: Virgina Norfolk M.D.   On: 12/30/2020 01:53    Subjective: No complaints. No dyspnea, wheezing has improved. No bleeding, had 2 BMs yesterday.   Discharge Exam: Vitals:   01/12/21 0807 01/12/21 1126  BP: (!) 148/73 129/66  Pulse: 77 66  Resp: 15 (!) 23  Temp: 98.7 F (37.1 C) 98.9 F (37.2 C)  SpO2: 95% 99%   General: Pt is alert, awake, not in acute distress Cardiovascular: RRR, S1/S2 +, no rubs, no gallops Respiratory: Nonlabored and clear. Some upper airway transmission. No wheezes. Abdominal: Distended but nontender, +BS Extremities: Stable 1+ pitting edema, no cyanosis  Labs: Basic Metabolic Panel: Recent Labs  Lab 01/05/21 1616 01/06/21 0600 01/07/21 1234 01/08/21 0500 01/10/21 0327  NA  --  146* 145 146* 145  K  --  3.7 4.1 3.7 3.7  CL  --  120* 118* 119* 116*  CO2  --  20* 20* 19* 20*  GLUCOSE  --  180* 218* 147* 131*  BUN  --  60* 49* 42* 28*  CREATININE  --  2.47*   2.55* 2.08* 1.88* 1.60*  CALCIUM  --  7.7* 8.0* 7.9* 7.7*  MG 2.6* 2.1  --   --   --   PHOS 4.0 4.0  --   --   --    Liver Function Tests: Recent Labs  Lab 01/07/21 1234 01/10/21 0327  AST 30 32  ALT 64* 44  ALKPHOS 151* 121  BILITOT 1.4* 1.4*  PROT 5.0* 4.8*  ALBUMIN 2.1* 2.0*   CBC: Recent Labs  Lab 01/07/21 1234 01/08/21 0500 01/10/21 0327  WBC 12.2* 12.0* 11.5*  HGB 9.9* 9.5* 8.5*  HCT 31.4* 30.8* 26.6*  MCV 102.3* 102.3* 101.5*  PLT 233 224 178   CBG: Recent Labs  Lab 01/11/21 0606 01/11/21 1739 01/11/21 2114 01/12/21 0629 01/12/21 1131  GLUCAP 125* 170* 175* 137* 106*   Urinalysis    Component Value Date/Time   COLORURINE YELLOW 12/29/2020 1012   APPEARANCEUR CLEAR 12/29/2020 1012   LABSPEC 1.015  12/29/2020 1012   PHURINE 5.5  12/29/2020 1012   GLUCOSEU 100 (A) 12/29/2020 1012   HGBUR NEGATIVE 12/29/2020 [place] 12/29/2020 1012   BILIRUBINUR negative 02/17/2015 1336   KETONESUR NEGATIVE 12/29/2020 1012   PROTEINUR NEGATIVE 12/29/2020 1012   UROBILINOGEN negative 02/17/2015 1336   UROBILINOGEN 0.2 01/27/2014 2028   NITRITE NEGATIVE 12/29/2020 1012   [place] 12/29/2020 1012   Time coordinating discharge: Approximately 40 minutes  Patrecia Pour, MD  [facility] 01/12/2021, 2:07 PM

## 2021-01-12 NOTE — TOC Transition Note (Signed)
Transition of Care [facility]) - CM/SW Discharge Note   Patient Details  Name: Dalton Miller MRN: [id number] Date of Birth: [birth year]  [facility] CM/SW Contact:  Pollie Friar, RN Phone Number: 01/12/2021, 11:08 AM   Clinical Narrative:    Patient is discharging to CIR today. CM signing off.    Final next level of care: IP Rehab Facility Barriers to Discharge: No Barriers Identified   Patient Goals and CMS Choice     Choice offered to / list presented to : Patient  Discharge Placement                       Discharge Plan and Services                                     Social Determinants of Health (SDOH) Interventions     Readmission Risk Interventions No flowsheet data found.

## 2021-01-12 NOTE — Progress Notes (Signed)
Inpatient Rehabilitation  Patient information reviewed and entered into eRehab system by Jarell Mcewen M. Ilissa Rosner, M.A., CCC/SLP, PPS Coordinator.  Information including medical coding, functional ability and quality indicators will be reviewed and updated through discharge.    

## 2021-01-12 NOTE — Progress Notes (Signed)
Inpatient Rehabilitation Admission Medication Review by a Pharmacist  A complete drug regimen review was completed for this patient to identify any potential clinically significant medication issues.  High Risk Drug Classes Is patient taking? Indication by Medication  Antipsychotic No   Anticoagulant No   Antibiotic Yes Rifaximin for hepatic encephalopathy  Opioid No   Antiplatelet Yes ASA for CVA prophx  Hypoglycemics/insulin Yes SSI for DM  Vasoactive Medication Yes Norvasc for HTN  Chemotherapy No   Other No      Type of Medication Issue Identified Description of Issue Recommendation(s)  Drug Interaction(s) (clinically significant)     Duplicate Therapy     Allergy     No Medication Administration End Date     Incorrect Dose     Additional Drug Therapy Needed  Claritin, Oxycodone, Tears,  Lyrica, Eye ointment/hs Resume if needed  Significant med changes from prior encounter (inform family/care partners about these prior to discharge).  D/c PTA Naproxen with GIB. Hold Eltrombopag and f/u with Heme/Onc.  Other       Clinically significant medication issues were identified that warrant physician communication and completion of prescribed/recommended actions by midnight of the next day:  No  Time spent performing this drug regimen review (minutes):  49min  Ashley Montminy S. Alford Highland, PharmD, BCPS Clinical Staff Pharmacist [email or web address] [place] 01/12/2021 2:47 PM

## 2021-01-12 NOTE — H&P (Signed)
Physical Medicine and Rehabilitation Admission H&P        Chief Complaint  Patient presents with   Altered Mental Status  : HPI: Dalton Miller is a 76 year old right-handed male with complicated medical history including melanoma with lung metastasis and urothelial carcinoma/renal cell cancer status post nephrectomy, hypertension, hyperlipidemia,, recent shoulder surgery, type 2 diabetes mellitus, COPD/tobacco use, right TKA.  Per chart review lives with youngest son.  Modified independent prior to admission.  1 level home with ramped entrance.  Presented 12/29/2020 with altered mental status after a fall to [facility], ER.  Admission chemistries unremarkable aside glucose 284, BUN 95, creatinine 1.77, AST 324, ALT 229, alkaline phosphatase 261, total bilirubin 3.6, WBC 40,700, hemoglobin 7.1, ammonia level 115, lactic acid greater than 9.0, blood cultures no growth to date, CK  357, COVID-19 positive.[name]  He was found to be obtunded and demonstrated disconjugate gaze as well as noted to be jaundiced.  He required intubation for airway protection due to hypoxia.  Family had noted complaints of abdominal pain and black tarry stools from the previous day.  Hemoccult was positive.  CT of the head showed ill-defined hypodensity involving the right basal ganglia and right frontal lobe concerning for acute right MCA territory infarct.  CT cervical spine negative for acute spine fracture or traumatic listhesis.  Ultrasound abdomen showed gallbladder wall thickening without evidence of cholelithiasis or additional findings to suggest the presence of acute cholecystitis.  Patient was transferred to [facility].  MRI/MRA 12/31/2020 scattered cortical and subcortical acute infarcts in the right MCA distribution with mild petechial hemorrhage.  70% stenosis of the right M1 segment.  Echocardiogram with ejection fraction of 50 to 55% no wall motion abnormalities grade 1 diastolic dysfunction.  Hospital course  patient underwent upper GI endoscopy 12/30/2020 per Dr. Lucio Edward findings of grade 2 varices in the distal esophagus requiring banding some red blood clots in the gastric fundus.  Normal duodenal bulb and second portion of duodenum.  He did receive 1 unit packed red blood cells.  In regards to patient's COVID-19 was not able to take paxlovid or mulnupiravir due to LFT derangement and outside of window for benefit of steroids.  Continued isolation x10 days from positive test 12/27 and isolation has been completed.  Neurology follow-up for CVA currently maintained on low-dose aspirin for CVA prophylaxis/no antithrombotic at this time secondary to GI bleed.  Patient was weaned from pressor support extubated 01/06/2020.  Rectal tube and Foley tube removed 01/11/2020.  His LFTs have markedly improved he remains on lactulose.  CT abdomen pelvis follow-up 01/05/2021 morphology feature liver suggestive of early cirrhosis moderate volume of ascites within the abdomen and pelvis.  There were 2 new low-attenuation structures within the dome of the liver.  These were new when compared to CT of 04/04/2019.  Cannot exclude benign or malignant liver lesions and recommendations were for outpatient follow-up.  He is tolerating a regular consistency diet.  WOC follow-up for MASD buttocks, perineal area, scrotum posterior medial thighs secondary to fecal incontinence with recommendations of routine skin care.  Therapy evaluations completed due to patient decreased functional mobility/multi medical was admitted for a comprehensive rehab program.   Review of Systems  Constitutional:  Negative for chills and fever.  HENT:  Negative for hearing loss.   Eyes:  Negative for blurred vision and double vision.  Respiratory:  Positive for shortness of breath. Negative for cough and wheezing.   Cardiovascular:  Positive for leg  swelling. Negative for chest pain and palpitations.  Gastrointestinal:  Positive for abdominal pain, blood in  stool and diarrhea. Negative for heartburn.  Genitourinary:  Negative for dysuria, flank pain and hematuria.  Musculoskeletal:  Positive for joint pain and myalgias.  Skin:  Negative for rash.  All other systems reviewed and are negative.     Past Medical History:  Diagnosis Date   Acquired renal cyst of right kidney     Arthritis     Cancer ([place])      renal cell    Erectile dysfunction     Heart murmur      CHILDHOOD   Hematuria      left ureteral bleeding   History of cellulitis      2012 left wrist   History of kidney stones     Hyperlipidemia     Hypertension     Metastatic melanoma to lung, left ([place])     Nephrolithiasis      bilateral non-obstructive per ct 07-02-2015   Type 2 diabetes mellitus ([place])     Wears glasses           Past Surgical History:  Procedure Laterality Date   CARPAL TUNNEL RELEASE Right 1990  approx   COLONOSCOPY   03/04/2010   CYSTOSCOPY W/ RETROGRADES Left 10/28/2015    Procedure: CYSTOSCOPY WITH LEFT RETROGRADE PYELOGRAM;  Surgeon: Cleon Gustin, MD;  Location: [facility];  Service: Urology;  Laterality: Left;   CYSTOSCOPY W/ URETERAL STENT PLACEMENT Left 10/28/2015    Procedure: CYSTOSCOPY WITH LEFT URETERAL STENT EXCHANGE;  Surgeon: Cleon Gustin, MD;  Location: [facility];  Service: Urology;  Laterality: Left;   CYSTOSCOPY WITH RETROGRADE PYELOGRAM, URETEROSCOPY AND STENT PLACEMENT Bilateral 09/17/2015    Procedure: CYSTOSCOPY WITH BILATERAL RETROGRADE PYELOGRAM, LEFT URETEROSCOPY WITH URETERAL BIOPSY AND  STENT PLACEMENT;  Surgeon: Irine Seal, MD;  Location[facility];  Service: Urology;  Laterality: Bilateral;   ESOPHAGEAL BANDING   12/30/2020    Procedure: ESOPHAGEAL BANDING;  Surgeon: Ladene Artist, MD;  Location: [facility] ENDOSCOPY;  Service: Endoscopy;;   ESOPHAGOGASTRODUODENOSCOPY (EGD) WITH PROPOFOL N/A 12/30/2020    Procedure: ESOPHAGOGASTRODUODENOSCOPY (EGD) WITH PROPOFOL;  Surgeon: Ladene Artist, MD;  Location: [facility]  ENDOSCOPY;  Service: Endoscopy;  Laterality: N/A;   HOLMIUM LASER APPLICATION Left 03/47/4259    Procedure: HOLMIUM LASER APPLICATION;  Surgeon: Irine Seal, MD;  Location: [facility];  Service: Urology;  Laterality: Left;   KNEE ARTHROSCOPY Right 2008   MELANOMA EXCISION   2018    Left arm   right total knee replacement  Right      2009   ROBOT ASSITED LAPAROSCOPIC NEPHROURETERECTOMY Left 12/04/2015    Procedure: XI ROBOT ASSITED LAPAROSCOPIC NEPHROURETERECTOMY;  Surgeon: Cleon Gustin, MD;  Location: [facility];  Service: Urology;  Laterality: Left;   SHOULDER ARTHROSCOPY Right 12/17/2020    Procedure: Right shoulder arthroscopy, debridement, subacromial decompression, distal clavicle resection;  Surgeon: Justice Britain, MD;  Location: [facility];  Service: Orthopedics;  Laterality: Right;  4min   STONE EXTRACTION WITH BASKET Left 09/17/2015    Procedure: STONE EXTRACTION WITH BASKET;  Surgeon: Irine Seal, MD;  Location: [facility];  Service: Urology;  Laterality: Left;   TOTAL KNEE ARTHROPLASTY Right 2009   URETEROSCOPY   10/28/2015    Procedure: DIAGNOSTIC LEFT URETEROSCOPY;  Surgeon: Cleon Gustin, MD;  Location: [facility];  Service: Urology;;   WISDOM TOOTH EXTRACTION  Family History  Problem Relation Age of Onset   Diabetes Mother     Heart disease Mother     Stroke Mother     CAD Mother     Diabetes Father     Congestive Heart Failure Father     Cancer Brother     Heart disease Son     COPD Son     Emphysema Son     Heart attack Brother      Social History:  reports that he has been smoking cigars and cigarettes. He has a 94.50 pack-year smoking history. He quit smokeless tobacco use about 28 years ago.  His smokeless tobacco use included chew. He reports that he does not drink alcohol and does not use drugs. Allergies: No Known Allergies       Medications Prior to Admission  Medication Sig Dispense Refill   amLODipine (NORVASC)  2.5 MG tablet Take 2.5 mg by mouth daily.       carboxymethylcellulose (REFRESH PLUS) 0.5 % SOLN Place 1 drop into both eyes in the morning, at noon, and at bedtime.       cyclobenzaprine (FLEXERIL) 10 MG tablet Take 1 tablet (10 mg total) by mouth 3 (three) times daily as needed for muscle spasms. 30 tablet 1   eltrombopag (PROMACTA) 25 MG tablet Take 25 mg by mouth daily.       loratadine (CLARITIN) 10 MG tablet Take 10 mg by mouth daily.       naproxen (NAPROSYN) 500 MG tablet Take 1 tablet (500 mg total) by mouth 2 (two) times daily with a meal. 60 tablet 1   oxyCODONE (OXY IR/ROXICODONE) 5 MG immediate release tablet Take 5 mg by mouth every 6 (six) hours as needed.       pregabalin (LYRICA) 75 MG capsule Take 75 mg by mouth 2 (two) times daily.       White Petrolatum-Mineral Oil (REFRESH P.M. OP) Place 1 application into both eyes at bedtime.       aspirin EC 81 MG tablet Take 1 tablet (81 mg total) by mouth daily. (Patient not taking: Reported on 12/09/2020) 90 tablet 1   atorvastatin (LIPITOR) 10 MG tablet Take 1 tablet (10 mg total) by mouth daily. (Patient not taking: Reported on 12/09/2020) 90 tablet 2   fluticasone (FLONASE) 50 MCG/ACT nasal spray SPRAY 2 SPRAYS INTO EACH NOSTRIL EVERY DAY (Patient not taking: Reported on 12/09/2020) 48 mL 1   lisinopril (ZESTRIL) 10 MG tablet TAKE 1 TABLET (10 MG TOTAL) BY MOUTH DAILY. NEEDS TO BE SEEN BEFORE NEXT REFILL. (Patient not taking: Reported on 12/09/2020) 30 tablet 0   ondansetron (ZOFRAN) 4 MG tablet Take 1 tablet (4 mg total) by mouth every 8 (eight) hours as needed for nausea or vomiting. (Patient not taking: Reported on 12/29/2020) 10 tablet 0   oxyCODONE-acetaminophen (PERCOCET) 5-325 MG tablet Take 1 tablet by mouth every 4 (four) hours as needed (max 6 q). (Patient not taking: Reported on 12/29/2020) 20 tablet 0      Drug Regimen Review Drug regimen was reviewed and remains appropriate with no significant issues identified   Home: Home  Living Family/patient expects to be discharged to:: Inpatient rehab Living Arrangements: Children Available Help at Discharge: Family, Available 24 hours/day Type of Home: Mobile home Home Access: Ramped entrance Home Layout: One level Bathroom Shower/Tub: Chiropodist: Standard Bathroom Accessibility: Yes Home Equipment: Cane - single point, BSC/3in1   Functional History: Prior Function Prior Level of Function :  Independent/Modified Independent   Functional Status:  Mobility: Bed Mobility Overal bed mobility: Needs Assistance Bed Mobility: Supine to Sit Rolling: Mod assist Sidelying to sit: Mod assist Supine to sit: Mod assist Sit to sidelying: +2 for safety/equipment General bed mobility comments: Requires cues for technique and to be sure he is bringing his left leg and arm along Transfers Overall transfer level: Needs assistance Equipment used: 1 person hand held assist Transfers: Sit to/from Stand, Bed to chair/wheelchair/BSC Sit to Stand: Mod assist Bed to/from chair/wheelchair/BSC transfer type:: Stand pivot Stand pivot transfers: Mod assist, +2 safety/equipment Step pivot transfers: Mod assist, +2 safety/equipment General transfer comment: Performed withou RW today. difficulty advancing legs with step pivot and with weight shift Ambulation/Gait Ambulation/Gait assistance:  (performed pregait activities in standing with attempting to step forward and back with each foot twice) Pre-gait activities: Standing and stepping with each foot   ADL: ADL Overall ADL's : Needs assistance/impaired Eating/Feeding: Set up, Cueing for sequencing, Cueing for compensatory techinques, Bed level Grooming: Minimal assistance, Bed level Upper Body Bathing: Maximal assistance, Bed level Lower Body Bathing: Maximal assistance, +2 for physical assistance, +2 for safety/equipment, Sitting/lateral leans, Sit to/from stand Upper Body Dressing : Maximal assistance, Bed  level Lower Body Dressing: Maximal assistance, +2 for physical assistance, +2 for safety/equipment, Sitting/lateral leans, Sit to/from stand Toilet Transfer: Maximal assistance, +2 for physical assistance, +2 for safety/equipment, Stand-pivot Toileting- Clothing Manipulation and Hygiene: Maximal assistance, +2 for physical assistance, +2 for safety/equipment, Sitting/lateral lean, Sit to/from stand General ADL Comments: Due to poor balance, incoordination/weakness of LUE, pt requiring max A +2 for all ADL's, difficulty with balnce EOB, some needing to be completed with bed in chair position   Cognition: Cognition Overall Cognitive Status: Impaired/Different from baseline Orientation Level: Oriented X4 Cognition Arousal/Alertness: Awake/alert Behavior During Therapy: WFL for tasks assessed/performed Overall Cognitive Status: Impaired/Different from baseline Area of Impairment: Following commands Orientation Level: Disoriented to, Time Current Attention Level: Sustained Following Commands: Follows one step commands with increased time, Follows one step commands consistently Safety/Judgement: Decreased awareness of deficits Problem Solving: Slow processing, Decreased initiation, Difficulty sequencing, Requires verbal cues General Comments: L inattention   Physical Exam: Blood pressure 128/76, pulse 73, temperature 98 F (36.7 C), temperature source Oral, resp. rate 20, height 5\' 7"  (1.702 m), weight 85 kg, SpO2 97 %. Physical Exam Constitutional:      General: He is not in acute distress. HENT:     Head: Normocephalic.     Right Ear: External ear normal.     Left Ear: External ear normal.     Nose: Nose normal.  Eyes:     Pupils: Pupils are equal, round, and reactive to light.  Cardiovascular:     Rate and Rhythm: Normal rate and regular rhythm.     Heart sounds: No murmur heard.   No gallop.  Pulmonary:     Effort: Pulmonary effort is normal. No respiratory distress.     Breath  sounds: No wheezing.  Abdominal:     General: Bowel sounds are normal. There is no distension.     Palpations: Abdomen is soft.     Tenderness: There is no abdominal tenderness.  Musculoskeletal:        General: No swelling, tenderness or deformity.     Cervical back: Normal range of motion.  Skin:    General: Skin is warm and dry.  Neurological:     Mental Status: He is alert.     Comments: Patient is alert.  Makes eye contact with examiner.  Provides name and age but limited medical historian. Normal language. Speech sl dysarthric. Follows simple commands. Left central 7 and tongue deviation. Right gaze preference.  LUE 2 to 2+/5. LLE 3 to 3+/5. RUE and RLE 4 to 4+/5. No obvious sensory deficits. No limb ataxia apparent. Normal resting tone and DTR's 1+.       Lab Results Last 48 Hours        Results for orders placed or performed during the hospital encounter of 12/29/20 (from the past 48 hour(s))  Glucose, capillary     Status: Abnormal    Collection Time: 01/09/21 12:35 PM  Result Value Ref Range    Glucose-Capillary 161 (H) 70 - 99 mg/dL      Comment: Glucose reference range applies only to samples taken after fasting for at least 8 hours.    Comment 1 Notify RN      Comment 2 Document in Chart    Glucose, capillary     Status: Abnormal    Collection Time: 01/09/21  4:45 PM  Result Value Ref Range    Glucose-Capillary 140 (H) 70 - 99 mg/dL      Comment: Glucose reference range applies only to samples taken after fasting for at least 8 hours.    Comment 1 Notify RN      Comment 2 Document in Chart    Glucose, capillary     Status: Abnormal    Collection Time: 01/09/21  9:40 PM  Result Value Ref Range    Glucose-Capillary 134 (H) 70 - 99 mg/dL      Comment: Glucose reference range applies only to samples taken after fasting for at least 8 hours.    Comment 1 Notify RN      Comment 2 Document in Chart    Comprehensive metabolic panel     Status: Abnormal    Collection Time:  01/10/21  3:27 AM  Result Value Ref Range    Sodium 145 135 - 145 mmol/L    Potassium 3.7 3.5 - 5.1 mmol/L    Chloride 116 (H) 98 - 111 mmol/L    CO2 20 (L) 22 - 32 mmol/L    Glucose, Bld 131 (H) 70 - 99 mg/dL      Comment: Glucose reference range applies only to samples taken after fasting for at least 8 hours.    BUN 28 (H) 8 - 23 mg/dL    Creatinine, Ser 1.60 (H) 0.61 - 1.24 mg/dL    Calcium 7.7 (L) 8.9 - 10.3 mg/dL    Total Protein 4.8 (L) 6.5 - 8.1 g/dL    Albumin 2.0 (L) 3.5 - 5.0 g/dL    AST 32 15 - 41 U/L    ALT 44 0 - 44 U/L    Alkaline Phosphatase 121 38 - 126 U/L    Total Bilirubin 1.4 (H) 0.3 - 1.2 mg/dL    GFR, Estimated 45 (L) >60 mL/min      Comment: (NOTE) Calculated using the CKD-EPI Creatinine Equation (2021)      Anion gap 9 5 - 15      Comment: Performed at [facility], [place] [address], [place] [address]  CBC     Status: Abnormal    Collection Time: 01/10/21  3:27 AM  Result Value Ref Range    WBC 11.5 (H) 4.0 - 10.5 K/uL    RBC 2.62 (L) 4.22 - 5.81 MIL/uL    Hemoglobin 8.5 (L)  13.0 - 17.0 g/dL    HCT 26.6 (L) 39.0 - 52.0 %    MCV 101.5 (H) 80.0 - 100.0 fL    MCH 32.4 26.0 - 34.0 pg    MCHC 32.0 30.0 - 36.0 g/dL    RDW 18.2 (H) 11.5 - 15.5 %    Platelets 178 150 - 400 K/uL    nRBC 0.0 0.0 - 0.2 %      Comment: Performed at [facility] [address] [place], [place] [address]  Glucose, capillary     Status: Abnormal    Collection Time: 01/10/21  6:04 AM  Result Value Ref Range    Glucose-Capillary 100 (H) 70 - 99 mg/dL      Comment: Glucose reference range applies only to samples taken after fasting for at least 8 hours.    Comment 1 Notify RN      Comment 2 Document in Chart    Glucose, capillary     Status: Abnormal    Collection Time: 01/10/21  1:18 PM  Result Value Ref Range    Glucose-Capillary 176 (H) 70 - 99 mg/dL      Comment: Glucose reference range applies only to samples taken after fasting for at least 8 hours.     Comment 1 Notify RN      Comment 2 Document in Chart    Glucose, capillary     Status: Abnormal    Collection Time: 01/10/21  4:56 PM  Result Value Ref Range    Glucose-Capillary 125 (H) 70 - 99 mg/dL      Comment: Glucose reference range applies only to samples taken after fasting for at least 8 hours.    Comment 1 Notify RN      Comment 2 Document in Chart    Glucose, capillary     Status: Abnormal    Collection Time: 01/10/21 10:06 PM  Result Value Ref Range    Glucose-Capillary 180 (H) 70 - 99 mg/dL      Comment: Glucose reference range applies only to samples taken after fasting for at least 8 hours.    Comment 1 Notify RN      Comment 2 Document in Chart    Glucose, capillary     Status: Abnormal    Collection Time: 01/11/21  6:06 AM  Result Value Ref Range    Glucose-Capillary 125 (H) 70 - 99 mg/dL      Comment: Glucose reference range applies only to samples taken after fasting for at least 8 hours.    Comment 1 Notify RN      Comment 2 Document in Chart        Imaging Results (Last 48 hours)  No results found.           Medical Problem List and Plan: 1. Functional deficits  disconjugate gaze with altered mental status/acute hepatic metabolic encephalopathy/multifactorial secondary to right MCA scattered infarcts             -patient may shower             -ELOS/Goals: 14-17 days, min assist goals with PT, OT, SLP 2.  Antithrombotics: -DVT/anticoagulation:  Mechanical: Antiembolism stockings, thigh (TED hose) Bilateral lower extremities             -antiplatelet therapy: Aspirin 81 mg daily 3. Pain Management: Tylenol held due to elevated LFTs 4. Mood: Provide emotional support             -antipsychotic agents: N/A  5. Neuropsych: This patient is capable of making decisions on his own behalf. 6. Skin/Wound Care: Routine skin checks 7. Fluids/Electrolytes/Nutrition: Routine in and outs with follow-up chemistries 8.  Acute blood loss anemia due to upper GI bleed  esophageal varices.  Status post banding x4 12/30/2020.   -Continue Protonix twice daily.   -serial hgb's -Follow-up per GI services 9.  Acute hypoxic respiratory failure.  Extubated 01/06/2020 10.  Metastatic recurrent melanoma/left upper lobe lesion, mediastinal adenopathy.  Status post 4 cycles ipo.nivo.  Follow-up outpatient             -currently on oxygen. Wean to off as able 10.  COVID-19 infection.  Appeared incidental.  Isolation completed. 11.  Recurrent low-grade urothelial carcinoma.  Status post TURBT x3.  Follow-up outpatient.   12.  Elevated LFTs/hepatic cirrhosis.  Markedly improved.  Continue lactulose.  Holding Lasix/Spiro due to AKI 13.  AKI.  Status post solitary kidney status post nephrectomy due to [place]. 14.  Diabetes mellitus.  Hemoglobin A1c 7.6.  SSI.             -borderline to fair control at present 15.  Hypertension.  Norvasc 10 mg daily.  Monitor with increased mobility 16.  Hyperlipidemia.  Lipitor resumed 10 mg daily     Cathlyn Parsons, PA-C 01/11/2021   I have personally performed a face to face diagnostic evaluation of this patient and formulated the key components of the plan.  Additionally, I have personally reviewed laboratory data, imaging studies, as well as relevant notes and concur with the physician assistant's documentation above.  The patient's status has not changed from the original H&P.  Any changes in documentation from the acute care chart have been noted above.  Meredith Staggers, MD, Mellody Drown

## 2021-01-12 NOTE — Progress Notes (Signed)
Inpatient Rehab Admissions Coordinator:  ? ?I have a bed for this Pt. On CIR today. RN may call report to [phone]. ? ?Lakeasha Petion, MS, CCC-SLP ?Rehab Admissions Coordinator  ?[phone] (celll) ?[phone] (office) ?

## 2021-01-12 NOTE — Progress Notes (Signed)
Patient arrived via stretcher at approximately 1440. Patient is A&O x 4 and able to make his needs known. Patient denies pain or discomfort at this time. Incontinent of bowel and bladder. Remains on a nasal cannula 2 L. Bilateral lungs wheezing, diminished lower lobes. ABD soft, non tender, scattered ABD bruising. 3 lumen PICC left upper arm, CDI. L elbow abrasion, Mepilex dressing removed,  no draining or odor. Cleansed and applied new Mepilex dressing. Left thigh abrasion. Mepilex dressings removed no drainage or odor, cleansed and applied new Mepilex. Bilateral lower leg edema +2. Positive pedal pulses, cap refill less that 3 seconds warm to touch. Buttock and peri area red, no open areas. Gerhardt's butt cream applied as ordered. Continue to reposition patient every 2 hours and PRN. Family at bedside, no questions or concerns at this time. Bed in low position, call light and personal items within reach. Safety maintained.

## 2021-01-12 NOTE — Progress Notes (Addendum)
PMR Admission Coordinator Pre-Admission Assessment   Patient: Dalton Miller is an 76 y.o., male MRN: [id number] DOB: 1945-03-23 Height:   Weight: 86.6 kg   Insurance Information HMO:     PPO:      PCP:      IPA:      80/20:      OTHER:  PRIMARY: [facility]      ENIDPO#:[id number]       Subscriber: Pt CM Name: Sherlynn Carbon           Phone#: [phone]     Fax#: [phone] Pt. Was approved for 30 days beginning 01/12/21 (updates due 01/05/43) Pre-Cert#:  [id number] Employer:         Benefits:  Phone #:      Name:  Irene Shipper. Date: current     Deduct:       Out of Pocket Max:       Life Max:  CIR:       SNF:  Outpatient:      Co-Pay:  Home Health:       Co-Pay:  DME:      Co-Pay:  Providers:  SECONDARY:  [employer] Medicare     Policy#: [id number]     Phone#:    Financial Counselor: none       Phone#: none    The Data Collection Information Summary for patients in Inpatient Rehabilitation Facilities with attached Privacy Act [place] Records was provided and verbally reviewed with: Family   Emergency Contact Information Contact Information       Name Relation Home Work Mobile    [place],Dawn Daughter [phone]   [phone]    [place], Gassman [phone]   [phone]    [place] Son     [phone]           Current Medical History  Patient Admitting Diagnosis: CVA,  COVID, GI BLEED  History of Present Illness: Dalton Miller is a 76 y.o. male, who was admitted 12/30/20 with altered mental status after a fall at home.  He had been found unresponsive at home.  On arrival to the ER he was hypoxic and hypotensive with blood pressure 74/50 and was intubated.  CT of the head showed an acute right MCA stroke.  He has a left hemiparesis/plegia Patient had been noticing some dark stools at home over the past week or 2 and had complained of some abdominal discomfort the day before admission.NG tube was placed in the emergency room and he had immediate return of 500 cc of  coffee-ground material.  Initial hemoglobin was 7.1 down from a hemoglobin of 14.9 on 12/14/2020.  He has been transfused and most recent hemoglobin stable at 9.6, platelets 266. WBC on admission 40.7, WBC today 26.6 BUN 108/creatinine 2.47/INR 1.5. Patient COVID-positive on admission Ammonia 115. .. EGD performed 12/28 revealed esophageal varices which were banded.  Ultimately he was weaned off pressor support and extubated on 1/3, transferred to floor on hospitalist service as of 1/5. On 4 L now. COVID precautions lifted 01/09/20. CIR was consulted to assist return to PLOF.    Complete NIHSS TOTAL: 7   Patient's [facility]  has been reviewed by the rehabilitation admission coordinator and physician.   Past Medical History      Past Medical History:  Diagnosis Date   Acquired renal cyst of right kidney     Arthritis     Cancer ([place])      renal cell  Erectile dysfunction     Heart murmur      CHILDHOOD   Hematuria      left ureteral bleeding   History of cellulitis      2012 left wrist   History of kidney stones     Hyperlipidemia     Hypertension     Metastatic melanoma to lung, left ([place])     Nephrolithiasis      bilateral non-obstructive per ct 07-02-2015   Type 2 diabetes mellitus ([place])     Wears glasses        Has the patient had major surgery during 100 days prior to admission? Yes   Family History   family history includes CAD in his mother; COPD in his son; Cancer in his brother; Congestive Heart Failure in his father; Diabetes in his father and mother; Emphysema in his son; Heart attack in his brother; Heart disease in his mother and son; Stroke in his mother.   Current Medications   Current Facility-Administered Medications:    0.9 %  sodium chloride infusion (Manually program via Guardrails IV Fluids), , Intravenous, Once, Mohan, Kinila T, MD   0.9 %  sodium chloride infusion, 250 mL, Intravenous, Continuous, Ogan,  Okoronkwo U, MD, Stopped at 01/02/21 0856   alteplase (CATHFLO ACTIVASE) injection 2 mg, 2 mg, Intracatheter, Once, Mannam, Praveen, MD   [START ON 01/09/2021] amLODipine (NORVASC) tablet 10 mg, 10 mg, Oral, Daily, Vance Gather B, MD   artificial tears (LACRILUBE) ophthalmic ointment, , Both Eyes, QHS, Mannam, Praveen, MD, Given at 01/07/21 2155   Chlorhexidine Gluconate Cloth 2 % PADS 6 each, 6 each, Topical, Daily, Anders Simmonds, MD, 6 each at 01/07/21 2157   feeding supplement (GLUCERNA SHAKE) (GLUCERNA SHAKE) liquid 237 mL, 237 mL, Oral, TID BM, Freda Jackson B, MD, 237 mL at 01/08/21 1433   influenza vaccine adjuvanted (FLUAD) injection 0.5 mL, 0.5 mL, Intramuscular, Tomorrow-1000, Dewald, Jonathan B, MD   insulin aspart (novoLOG) injection 0-15 Units, 0-15 Units, Subcutaneous, TID WC, Patrecia Pour, MD, 2 Units at 01/08/21 1208   lactulose (Poca) 10 GM/15ML solution 10 g, 10 g, Oral, TID, Patrecia Pour, MD   MEDLINE mouth rinse, 15 mL, Mouth Rinse, BID, Freda Jackson B, MD, 15 mL at 01/08/21 5638   multivitamin with minerals tablet 1 tablet, 1 tablet, Oral, Daily, Freda Jackson B, MD, 1 tablet at 01/08/21 0939   pantoprazole (PROTONIX) EC tablet 40 mg, 40 mg, Oral, BID, Dang, Thuy D, RPH, 40 mg at 01/08/21 9373   protein supplement (ENSURE MAX) liquid, 11 oz, Oral, Daily, Freda Jackson B, MD, 11 oz at 01/08/21 0939   rifaximin (XIFAXAN) tablet 550 mg, 550 mg, Oral, BID, Vance Gather B, MD, 550 mg at 01/08/21 1208   sodium chloride flush (NS) 0.9 % injection 10-40 mL, 10-40 mL, Intracatheter, Q12H, Mannam, Praveen, MD, 10 mL at 01/08/21 0939   sodium chloride flush (NS) 0.9 % injection 10-40 mL, 10-40 mL, Intracatheter, PRN, Mannam, Praveen, MD   Patients Current Diet:  Diet Order                  Diet Carb Modified Fluid consistency: Thin; Room service appropriate? Yes with Assist  Diet effective now                         Precautions /  Restrictions Precautions Precautions: Fall Precaution Comments: on RA upon PT arrival Restrictions Weight Bearing Restrictions: No  Has the patient had 2 or more falls or a fall with injury in the past year? No   Prior Activity Level Community (5-7x/wk): Active in the community PTA   Prior Functional Level Self Care: Did the patient need help bathing, dressing, using the toilet or eating? Independent   Indoor Mobility: Did the patient need assistance with walking from room to room (with or without device)? Independent   Stairs: Did the patient need assistance with internal or external stairs (with or without device)? Independent   Functional Cognition: Did the patient need help planning regular tasks such as shopping or remembering to take medications? Independent   Patient Information Are you of Hispanic, Latino/a,or Spanish origin?: A. No, not of Hispanic, Latino/a, or Spanish origin What is your race?: A. White Do you need or want an interpreter to communicate with a doctor or health care staff?: 0. No   Patient's Response To:  Health Literacy and Transportation Is the patient able to respond to health literacy and transportation needs?: Yes Health Literacy - How often do you need to have someone help you when you read instructions, pamphlets, or other written material from your doctor or pharmacy?: Never In the past 12 months, has lack of transportation kept you from medical appointments or from getting medications?: No In the past 12 months, has lack of transportation kept you from meetings, work, or from getting things needed for daily living?: No   Home Assistive Devices / [place]: Cane - single point, BSC/3in1   Prior Device Use: Indicate devices/aids used by the patient prior to current illness, exacerbation or injury? None of the above   Current Functional Level Cognition   Overall Cognitive Status: Impaired/Different from baseline Current  Attention Level: Sustained Orientation Level: Oriented X4 Following Commands: Follows one step commands with increased time, Follows one step commands consistently Safety/Judgement: Decreased awareness of deficits General Comments: L inattention    Extremity Assessment (includes Sensation/Coordination)   Upper Extremity Assessment: Defer to OT evaluation RUE Deficits / Details: recent shoulder surgery due to impingment, RTC? RUE: Shoulder pain with ROM RUE Sensation: WNL RUE Coordination: decreased gross motor LUE Deficits / Details: Minimal shoulder flexion, trace muscle contraction in elbows, wrists, and fingers. LUE Sensation: decreased light touch, decreased proprioception LUE Coordination: decreased fine motor, decreased gross motor  Lower Extremity Assessment: LLE deficits/detail LLE Deficits / Details: 2+/5 DF/PF, 2/5 knee flexion/ext modified MMT in supine LLE Sensation: decreased light touch, decreased proprioception LLE Coordination: decreased fine motor, decreased gross motor     ADLs   Overall ADL's : Needs assistance/impaired Eating/Feeding: Set up, Cueing for sequencing, Cueing for compensatory techinques, Bed level Grooming: Minimal assistance, Bed level Upper Body Bathing: Maximal assistance, Bed level Lower Body Bathing: Maximal assistance, +2 for physical assistance, +2 for safety/equipment, Sitting/lateral leans, Sit to/from stand Upper Body Dressing : Maximal assistance, Bed level Lower Body Dressing: Maximal assistance, +2 for physical assistance, +2 for safety/equipment, Sitting/lateral leans, Sit to/from stand Toilet Transfer: Maximal assistance, +2 for physical assistance, +2 for safety/equipment, Stand-pivot Toileting- Clothing Manipulation and Hygiene: Maximal assistance, +2 for physical assistance, +2 for safety/equipment, Sitting/lateral lean, Sit to/from stand General ADL Comments: Due to poor balance, incoordination/weakness of LUE, pt requiring max A +2  for all ADL's, difficulty with balnce EOB, some needing to be completed with bed in chair position     Mobility   Overal bed mobility: Needs Assistance Bed Mobility: Rolling, Sidelying to Sit Rolling: Mod assist Sidelying to sit: Mod assist  Sit to sidelying: +2 for physical assistance, +2 for safety/equipment, Mod assist General bed mobility comments: cues for technique, assist to position shoulders and cues for reaching for rail, assist for L LE off bed and trunk upright     Transfers   Overall transfer level: Needs assistance Equipment used: Rolling walker (2 wheels) Transfers: Sit to/from Stand, Bed to chair/wheelchair/BSC Sit to Stand: From elevated surface, Mod assist, +2 physical assistance Bed to/from chair/wheelchair/BSC transfer type:: Step pivot Step pivot transfers: +2 physical assistance, Mod assist General transfer comment: assist for balance/lifting; stepping forward then around with RW to recliner with step by step cues, L LE support in stance and some assist for L LE progression, assist to keep L hand on walker and for balance     Ambulation / Gait / Stairs / Wheelchair Mobility         Posture / Balance Dynamic Sitting Balance Sitting balance - Comments: cues for anterior and L lateral lean due to falling back and to R; min A overall sitting balance Balance Overall balance assessment: Needs assistance Sitting-balance support: Feet supported, Single extremity supported Sitting balance-Leahy Scale: Poor Sitting balance - Comments: cues for anterior and L lateral lean due to falling back and to R; min A overall sitting balance Postural control: Right lateral lean, Posterior lean Standing balance support: Bilateral upper extremity supported Standing balance-Leahy Scale: Poor Standing balance comment: min A with RW standing standing, mod A for balance with walker in motion     Special needs/care consideration Skin abrasion, ecchymosis to L elbow, hip, and R arm     Previous Home Environment (from acute therapy documentation) Living Arrangements: Children (lives with youngest son) Available Help at Discharge: Family, Available 24 hours/day Type of Home: Mobile home Home Layout: One level Home Access: Ramped entrance Bathroom Shower/Tub: Chiropodist: Standard Bathroom Accessibility: Yes How Accessible: Accessible via walker   Discharge Living Setting Plans for Discharge Living Setting: Patient's home Type of Home at Discharge: Mobile home Discharge Home Layout: One level Discharge Home Access: Ramped entrance Discharge Bathroom Shower/Tub: Tub/shower unit Discharge Bathroom Toilet: Standard Discharge Bathroom Accessibility: Yes   Social/Family/Support Systems Patient Roles: Other (Comment) Contact Information: [phone] Anticipated Caregiver: Duanne Duchesne (daughter) Sons and daughter to rotate to provide care Anticipated Caregiver's Contact Information: [phone] Ability/Limitations of Caregiver: 24/7 min a Caregiver Availability: 24/7 Discharge Plan Discussed with Primary Caregiver: Yes Is Caregiver In Agreement with Plan?: Yes Does Caregiver/Family have Issues with Lodging/Transportation while Pt is in Rehab?: No   Goals Patient/Family Goal for Rehab: PT/OT/SLP Min A Expected length of stay: 14-16 days Pt/Family Agrees to Admission and willing to participate: Yes Program Orientation Provided & Reviewed with Pt/Caregiver Including Roles  & Responsibilities: Yes   Decrease burden of Care through IP rehab admission: Specialzed equipment needs, Decrease number of caregivers, Bowel and bladder program, and Patient/family education   Possible need for SNF placement upon discharge: not anticipated    Patient Condition: I have reviewed medical records from [facility] , spoken with CM, and patient and daughter. I met with patient at the bedside for inpatient rehabilitation assessment.  Patient will  benefit from ongoing PT, OT, and SLP, can actively participate in 3 hours of therapy a day 5 days of the week, and can make measurable gains during the admission.  Patient will also benefit from the coordinated team approach during an Inpatient Acute Rehabilitation admission.  The patient will receive intensive therapy as well as Rehabilitation physician,  nursing, Education officer, museum, and care management interventions.  Due to bladder management, bowel management, safety, skin/wound care, disease management, medication administration, pain management, and patient education the patient requires 24 hour a day rehabilitation nursing.  The patient is currently min-mod A with mobility and basic ADLs.  Discharge setting and therapy post discharge at home with home health is anticipated.  Patient has agreed to participate in the Acute Inpatient Rehabilitation Program and will admit today.   Preadmission Screen Completed By:  Genella Mech, 01/08/2021 3:00 PM ______________________________________________________________________   Discussed status with Dr. Naaman Plummer on 01/12/21 at 58   and received approval for admission today.   Admission Coordinator:  Genella Mech, CCC-SLP, time 1100/Date 11/12/21    Assessment/Plan: Diagnosis: CVA, covid + Does the need for close, 24 hr/day Medical supervision in concert with the patient's rehab needs make it unreasonable for this patient to be served in a less intensive setting? Yes Co-Morbidities requiring supervision/potential complications: GIB, abla, elevated ammonia level Due to bladder management, bowel management, safety, skin/wound care, disease management, medication administration, pain management, and patient education, does the patient require 24 hr/day rehab nursing? Yes Does the patient require coordinated care of a physician, rehab nurse, PT, OT, and SLP to address physical and functional deficits in the context of the above medical diagnosis(es)? Yes Addressing  deficits in the following areas: balance, endurance, locomotion, strength, transferring, bowel/bladder control, bathing, dressing, feeding, grooming, toileting, cognition, and psychosocial support Can the patient actively participate in an intensive therapy program of at least 3 hrs of therapy 5 days a week? Yes The potential for patient to make measurable gains while on inpatient rehab is excellent Anticipated functional outcomes upon discharge from inpatient rehab: min assist PT, min assist OT, min assist SLP Estimated rehab length of stay to reach the above functional goals is: 14-16 days Anticipated discharge destination: Home 10. Overall Rehab/Functional Prognosis: excellent     MD Signature: Meredith Staggers, MD, [place] Director Rehabilitation Services 01/12/2021

## 2021-01-12 NOTE — Discharge Instructions (Addendum)
Inpatient Rehab Discharge Instructions  [place] Discharge date and time: No discharge date for patient encounter.   Activities/Precautions/ Functional Status: Activity: activity as tolerated Diet: Carb modified Wound Care: Routine skin checks Functional status:  ___ No restrictions     ___ Walk up steps independently ___ 24/7 supervision/assistance   ___ Walk up steps with assistance ___ Intermittent supervision/assistance  ___ Bathe/dress independently ___ Walk with walker     __x_ Bathe/dress with assistance ___ Walk Independently    ___ Shower independently ___ Walk with assistance    ___ Shower with assistance ___ No alcohol     ___ Return to work/school ________  Special Instructions: No driving smoking or alcohol    COMMUNITY REFERRALS UPON DISCHARGE:    Outpatient: PT, OT, SP             Agency:[facility] [place] [address]   [phone]              Appointment Date/Time:WILL CALL TO SET UP APPOINTMENTS  Medical Equipment/Items Ordered: ROLLING WALKER & 3 IN 1                                                 [facility]Supplier[facility]   My questions have been answered and I understand these instructions. I will adhere to these goals and the provided educational materials after my discharge from the hospital.  Patient/Caregiver Signature _______________________________ Date __________  Clinician Signature _______________________________________ Date __________  Please bring this form and your medication list with you to all your follow-up doctor's appointments.  STROKE/TIA DISCHARGE INSTRUCTIONS SMOKING Cigarette smoking nearly doubles your risk of having a stroke & is the single most alterable risk factor  If you smoke or have smoked in the last 12 months, you are advised to quit smoking for your health. Most of the excess cardiovascular risk related to smoking disappears within a year of  stopping. Ask you doctor about anti-smoking medications [place] Quit Line: 1-800-QUIT NOW Free Smoking Cessation Classes [phone]  CHOLESTEROL Know your levels; limit fat & cholesterol in your diet  Lipid Panel     Component Value Date/Time   CHOL 108 12/31/2020 1016   CHOL 124 05/20/2019 1224   TRIG 319 (H) 01/02/2021 0407   TRIG 106 10/04/2012 1640   HDL <10 (L) 12/31/2020 1016   HDL 37 (L) 05/20/2019 1224   HDL 42 10/04/2012 1640   CHOLHDL NOT CALCULATED 12/31/2020 1016   VLDL 56 (H) 12/31/2020 1016   [place] NOT CALCULATED 12/31/2020 1016   LDLCALC 71 05/20/2019 1224   [place] 78 10/04/2012 1640     Many patients benefit from treatment even if their cholesterol is at goal. Goal: Total Cholesterol (CHOL) less than 160 Goal:  Triglycerides (TRIG) less than 150 Goal:  HDL greater than 40 Goal:  LDL (LDLCALC) less than 100   BLOOD PRESSURE American Stroke Association blood pressure target is less that 120/80 mm/Hg  Your discharge blood pressure is:  BP: 132/76 Monitor your blood pressure Limit your salt and alcohol intake Many individuals will require more than one medication for high blood pressure  DIABETES (A1c is a blood sugar average for last 3 months) Goal HGBA1c is under 7% (HBGA1c is blood sugar average for last 3 months)  Diabetes:   Lab Results  Component Value Date   HGBA1C 6.2 (H) 12/31/2020    Your HGBA1c can be lowered with medications, healthy diet, and exercise. Check your blood sugar as directed by your physician Call your physician if you experience unexplained or low blood sugars.  PHYSICAL ACTIVITY/REHABILITATION Goal is 30 minutes at least 4 days per week  Activity: Increase activity slowly, Therapies: Physical Therapy: Home Health Return to work:  Activity decreases your risk of heart attack and stroke and makes your heart stronger.  It helps control your weight and blood pressure; helps you relax and can improve your mood. Participate in a regular  exercise program. Talk with your doctor about the best form of exercise for you (dancing, walking, swimming, cycling).  DIET/WEIGHT Goal is to maintain a healthy weight  Your discharge diet is:  Diet Order             Diet Carb Modified Fluid consistency: Thin; Room service appropriate? Yes with Assist  Diet effective now                   liquids Your height is:    Your current weight is:   Your Body Mass Index (BMI) is:    Following the type of diet specifically designed for you will help prevent another stroke. Your goal weight range is:   Your goal Body Mass Index (BMI) is 19-24. Healthy food habits can help reduce 3 risk factors for stroke:  High cholesterol, hypertension, and excess weight.  RESOURCES Stroke/Support Group:  Call [phone]   STROKE EDUCATION PROVIDED/REVIEWED AND GIVEN TO PATIENT Stroke warning signs and symptoms How to activate emergency medical system (call 911). Medications prescribed at discharge. Need for follow-up after discharge. Personal risk factors for stroke. Pneumonia vaccine given: No Flu vaccine given: No My questions have been answered, the writing is legible, and I understand these instructions.  I will adhere to these goals & educational materials that have been provided to me after my discharge from the hospital.

## 2021-01-13 LAB — COMPREHENSIVE METABOLIC PANEL
ALT: 33 U/L (ref 0–44)
AST: 32 U/L (ref 15–41)
Albumin: 2.2 g/dL — ABNORMAL LOW (ref 3.5–5.0)
Alkaline Phosphatase: 120 U/L (ref 38–126)
Anion gap: 8 (ref 5–15)
BUN: 20 mg/dL (ref 8–23)
CO2: 22 mmol/L (ref 22–32)
Calcium: 7.7 mg/dL — ABNORMAL LOW (ref 8.9–10.3)
Chloride: 115 mmol/L — ABNORMAL HIGH (ref 98–111)
Creatinine, Ser: 1.38 mg/dL — ABNORMAL HIGH (ref 0.61–1.24)
GFR, Estimated: 53 mL/min — ABNORMAL LOW (ref >60)
Glucose, Bld: 126 mg/dL — ABNORMAL HIGH (ref 70–99)
Potassium: 4.2 mmol/L (ref 3.5–5.1)
Sodium: 145 mmol/L (ref 135–145)
Total Bilirubin: 1.5 mg/dL — ABNORMAL HIGH (ref 0.3–1.2)
Total Protein: 5.5 g/dL — ABNORMAL LOW (ref 6.5–8.1)

## 2021-01-13 LAB — CBC WITH DIFFERENTIAL/PLATELET
Abs Immature Granulocytes: 0.05 10*3/uL (ref 0.00–0.07)
Basophils Absolute: 0.1 10*3/uL (ref 0.0–0.1)
Basophils Relative: 1 %
Eosinophils Absolute: 0.1 10*3/uL (ref 0.0–0.5)
Eosinophils Relative: 1 %
HCT: 31.4 % — ABNORMAL LOW (ref 39.0–52.0)
Hemoglobin: 9.9 g/dL — ABNORMAL LOW (ref 13.0–17.0)
Immature Granulocytes: 0 %
Lymphocytes Relative: 13 %
Lymphs Abs: 1.6 10*3/uL (ref 0.7–4.0)
MCH: 31.9 pg (ref 26.0–34.0)
MCHC: 31.5 g/dL (ref 30.0–36.0)
MCV: 101.3 fL — ABNORMAL HIGH (ref 80.0–100.0)
Monocytes Absolute: 1.1 10*3/uL — ABNORMAL HIGH (ref 0.1–1.0)
Monocytes Relative: 9 %
Neutro Abs: 8.9 10*3/uL — ABNORMAL HIGH (ref 1.7–7.7)
Neutrophils Relative %: 76 %
Platelets: 143 10*3/uL — ABNORMAL LOW (ref 150–400)
RBC: 3.1 MIL/uL — ABNORMAL LOW (ref 4.22–5.81)
RDW: 17.2 % — ABNORMAL HIGH (ref 11.5–15.5)
WBC: 11.8 10*3/uL — ABNORMAL HIGH (ref 4.0–10.5)
nRBC: 0 % (ref 0.0–0.2)

## 2021-01-13 LAB — GLUCOSE, CAPILLARY
Glucose-Capillary: 120 mg/dL — ABNORMAL HIGH (ref 70–99)
Glucose-Capillary: 139 mg/dL — ABNORMAL HIGH (ref 70–99)
Glucose-Capillary: 147 mg/dL — ABNORMAL HIGH (ref 70–99)
Glucose-Capillary: 161 mg/dL — ABNORMAL HIGH (ref 70–99)

## 2021-01-13 MED ORDER — CHLORHEXIDINE GLUCONATE CLOTH 2 % EX PADS
6.0000 | MEDICATED_PAD | Freq: Every day | CUTANEOUS | Status: DC
  Administered 2021-01-13 – 2021-01-22 (×10): 6 via TOPICAL

## 2021-01-13 NOTE — Evaluation (Signed)
Occupational Therapy Assessment and Plan  Patient Details  Name: Dalton Miller MRN: [id number] Date of Birth: 1945-04-13  OT Diagnosis: disturbance of vision, hemiplegia affecting non-dominant side, and muscle weakness (generalized) Rehab Potential: Rehab Potential (ACUTE ONLY): Good ELOS: 2-2.5 weeks   Today's Date: 01/13/2021 OT Individual Time: 1100-1204 OT Individual Time Calculation (min): 64 min     Hospital Problem: Principal Problem:   Right middle cerebral artery stroke [facility])   Past Medical History:  Past Medical History:  Diagnosis Date   Acquired renal cyst of right kidney    Arthritis    Cancer ([place])    renal cell    Erectile dysfunction    Heart murmur    CHILDHOOD   Hematuria    left ureteral bleeding   History of cellulitis    2012 left wrist   History of kidney stones    Hyperlipidemia    Hypertension    Metastatic melanoma to lung, left ([place])    Nephrolithiasis    bilateral non-obstructive per ct 07-02-2015   Type 2 diabetes mellitus ([place])    Wears glasses    Past Surgical History:  Past Surgical History:  Procedure Laterality Date   CARPAL TUNNEL RELEASE Right 1990  approx   COLONOSCOPY  03/04/2010   CYSTOSCOPY W/ RETROGRADES Left 10/28/2015   Procedure: CYSTOSCOPY WITH LEFT RETROGRADE PYELOGRAM;  Surgeon: Cleon Gustin, MD;  Location: [facility];  Service: Urology;  Laterality: Left;   CYSTOSCOPY W/ URETERAL STENT PLACEMENT Left 10/28/2015   Procedure: CYSTOSCOPY WITH LEFT URETERAL STENT EXCHANGE;  Surgeon: Cleon Gustin, MD;  Location: [facility];  Service: Urology;  Laterality: Left;   CYSTOSCOPY WITH RETROGRADE PYELOGRAM, URETEROSCOPY AND STENT PLACEMENT Bilateral 09/17/2015   Procedure: CYSTOSCOPY WITH BILATERAL RETROGRADE PYELOGRAM, LEFT URETEROSCOPY WITH URETERAL BIOPSY AND  STENT PLACEMENT;  Surgeon: Irine Seal, MD;  Location: [facility];  Service: Urology;  Laterality: Bilateral;   ESOPHAGEAL BANDING  12/30/2020    Procedure: ESOPHAGEAL BANDING;  Surgeon: Ladene Artist, MD;  Location: [facility] ENDOSCOPY;  Service: Endoscopy;;   ESOPHAGOGASTRODUODENOSCOPY (EGD) WITH PROPOFOL N/A 12/30/2020   Procedure: ESOPHAGOGASTRODUODENOSCOPY (EGD) WITH PROPOFOL;  Surgeon: Ladene Artist, MD;  Location: [facility] ENDOSCOPY;  Service: Endoscopy;  Laterality: N/A;   HOLMIUM LASER APPLICATION Left 29/79/8921   Procedure: HOLMIUM LASER APPLICATION;  Surgeon: Irine Seal, MD;  Location: [facility];  Service: Urology;  Laterality: Left;   KNEE ARTHROSCOPY Right 2008   MELANOMA EXCISION  2018   Left arm   right total knee replacement  Right    2009   ROBOT ASSITED LAPAROSCOPIC NEPHROURETERECTOMY Left 12/04/2015   Procedure: XI ROBOT ASSITED LAPAROSCOPIC NEPHROURETERECTOMY;  Surgeon: Cleon Gustin, MD;  Location: [facility];  Service: Urology;  Laterality: Left;   SHOULDER ARTHROSCOPY Right 12/17/2020   Procedure: Right shoulder arthroscopy, debridement, subacromial decompression, distal clavicle resection;  Surgeon: Justice Britain, MD;  Location: [facility];  Service: Orthopedics;  Laterality: Right;  26mn   STONE EXTRACTION WITH BASKET Left 09/17/2015   Procedure: STONE EXTRACTION WITH BASKET;  Surgeon: JIrine Seal MD;  [facility]  Service: Urology;  Laterality: Left;   TOTAL KNEE ARTHROPLASTY Right 2009   URETEROSCOPY  10/28/2015   Procedure: DIAGNOSTIC LEFT URETEROSCOPY;  Surgeon: PCleon Gustin MD;  Location: [facility];  Service: Urology;;   WISDOM TOOTH EXTRACTION      Assessment & Plan Clinical Impression:  Patient is a 76year old right-handed male with complicated medical history including  melanoma with lung metastasis and urothelial carcinoma/renal cell cancer status post nephrectomy, hypertension, hyperlipidemia,, recent shoulder surgery, type 2 diabetes mellitus, COPD/tobacco use, right TKA.  Per chart review lives with youngest son.  Modified independent prior to admission.  1  level home with ramped entrance.  Presented 12/29/2020 with altered mental status after a fall to [facility], ER.  Admission chemistries unremarkable aside glucose 284, BUN 95, creatinine 1.77, AST 324, ALT 229, alkaline phosphatase 261, total bilirubin 3.6, WBC 40,700, hemoglobin 7.1, ammonia level 115, lactic acid greater than 9.0, blood cultures no growth to date, CK  357, COVID-19 positive.[name]  He was found to be obtunded and demonstrated disconjugate gaze as well as noted to be jaundiced.  He required intubation for airway protection due to hypoxia.  Family had noted complaints of abdominal pain and black tarry stools from the previous day.  Hemoccult was positive.  CT of the head showed ill-defined hypodensity involving the right basal ganglia and right frontal lobe concerning for acute right MCA territory infarct.  CT cervical spine negative for acute spine fracture or traumatic listhesis.  Ultrasound abdomen showed gallbladder wall thickening without evidence of cholelithiasis or additional findings to suggest the presence of acute cholecystitis.  Patient was transferred to [facility].  MRI/MRA 12/31/2020 scattered cortical and subcortical acute infarcts in the right MCA distribution with mild petechial hemorrhage.  70% stenosis of the right M1 segment.  Echocardiogram with ejection fraction of 50 to 55% no wall motion abnormalities grade 1 diastolic dysfunction.  Hospital course patient underwent upper GI endoscopy 12/30/2020 per Dr. Lucio Edward findings of grade 2 varices in the distal esophagus requiring banding some red blood clots in the gastric fundus.  Normal duodenal bulb and second portion of duodenum.  He did receive 1 unit packed red blood cells.  In regards to patient's COVID-19 was not able to take paxlovid or mulnupiravir due to LFT derangement and outside of window for benefit of steroids.  Continued isolation x10 days from positive test 12/27 and isolation has been completed.   Neurology follow-up for CVA currently maintained on low-dose aspirin for CVA prophylaxis/no antithrombotic at this time secondary to GI bleed.  Patient was weaned from pressor support extubated 01/06/2020.  Rectal tube and Foley tube removed 01/11/2020.  His LFTs have markedly improved he remains on lactulose.  CT abdomen pelvis follow-up 01/05/2021 morphology feature liver suggestive of early cirrhosis moderate volume of ascites within the abdomen and pelvis.  There were 2 new low-attenuation structures within the dome of the liver.  These were new when compared to CT of 04/04/2019.  Cannot exclude benign or malignant liver lesions and recommendations were for outpatient follow-up.  He is tolerating a regular consistency diet.  WOC follow-up for MASD buttocks, perineal area, scrotum posterior medial thighs secondary to fecal incontinence with recommendations of routine skin care.  Therapy evaluations completed due to patient decreased functional mobility/multi medical was admitted for a comprehensive rehab program.  Patient transferred to CIR on 01/12/2021 .   Patient currently requires mod with basic self-care skills secondary to muscle weakness and muscle paralysis, impaired timing and sequencing, abnormal tone, unbalanced muscle activation, decreased coordination, and decreased motor planning, decreased visual perceptual skills, and decreased attention to left and left side neglect.  Prior to hospitalization, patient could complete ADLs with modified independence.  Patient will benefit from skilled intervention to decrease level of assist with basic self-care skills and increase independence with basic self-care skills prior to discharge home with  care partner.  Anticipate patient will require 24 hour supervision and minimal physical assistance and follow up home health.  OT - End of Session Activity Tolerance: Tolerates 10 - 20 min activity with multiple rests Endurance Deficit: Yes Endurance Deficit  Description: Oxygen desaturation while on 2L to 85% with simple bed mobility. Requires prolonged rest breaks for recovery and during simple functional mobility tasks OT Assessment Rehab Potential (ACUTE ONLY): Good OT Barriers to Discharge: Incontinence;New oxygen OT Patient demonstrates impairments in the following area(s): Balance;Cognition;Endurance;Motor;Skin Integrity;Vision OT Basic ADL's Functional Problem(s): Grooming;Bathing;Dressing;Toileting OT Transfers Functional Problem(s): Toilet;Tub/Shower OT Additional Impairment(s): Fuctional Use of Upper Extremity OT Plan OT Intensity: Minimum of 1-2 x/day, 45 to 90 minutes OT Frequency: 5 out of 7 days OT Duration/Estimated Length of Stay: 2-2.5 weeks OT Treatment/Interventions: Balance/vestibular training;Cognitive remediation/compensation;Community reintegration;Discharge planning;DME/adaptive equipment instruction;Functional electrical stimulation;Functional mobility training;Neuromuscular re-education;Patient/family education;Psychosocial support;Self Care/advanced ADL retraining;Skin care/wound managment;Therapeutic Activities;Therapeutic Exercise;UE/LE Strength taining/ROM;UE/LE Coordination activities;Visual/perceptual remediation/compensation OT Self Feeding Anticipated Outcome(s): N/A OT Basic Self-Care Anticipated Outcome(s): CGA-Min A OT Toileting Anticipated Outcome(s): CGA-Min A OT Bathroom Transfers Anticipated Outcome(s): CGA OT Recommendation Patient destination: Home Follow Up Recommendations: Home health OT Equipment Recommended: To be determined   OT Evaluation Precautions/Restrictions  Precautions Precautions: Fall Precaution Comments: L inattention; L hemi; post covid; recent R shoulder arthroscopy and debridement (12/17/20) Restrictions Weight Bearing Restrictions: No General Chart Reviewed: Yes Additional Pertinent History: PMHx significant for melanoma with lung mets, urothelial carcinoma/renal cell cancer  status post nephrectomy, HTN, HLD, recent shoulder surgery, DMII, COPD/tobacco use and right TKA. Family/Caregiver Present: No Vital Signs  Pain Pain Assessment Pain Scale: 0-10 Pain Score: 0-No pain Faces Pain Scale: No hurt Home Living/Prior Functioning Home Living Family/patient expects to be discharged to:: Private residence Living Arrangements: Children Available Help at Discharge: Family, Available 24 hours/day Type of Home: Mobile home Home Access: Ramped entrance Home Layout: One level Bathroom Shower/Tub: Optometrist: Yes  Lives With: Family, Other (Comment) (son) IADL History Homemaking Responsibilities: Yes Meal Prep Responsibility: No Laundry Responsibility: Primary Cleaning Responsibility: Primary Bill Paying/Finance Responsibility: Primary Shopping Responsibility: Primary Leisure and Hobbies: Fishing Prior Function Level of Independence: Independent with basic ADLs, Independent with homemaking with ambulation Driving: Yes Vocation: Retired Leisure: Hobbies-yes (Comment) (enjoys fishing) Vision Baseline Vision/History: 1 Wears glasses Ability to See in Adequate Light: 0 Adequate Patient Visual Report: No change from baseline Vision Assessment?: Yes Eye Alignment: Within Functional Limits Ocular Range of Motion: Restricted on the left Alignment/Gaze Preference: Gaze right;Head turned Tracking/Visual Pursuits: Decreased smoothness of horizontal tracking;Decreased smoothness of vertical tracking;Decreased smoothness of eye movement to LEFT superior field;Decreased smoothness of eye movement to LEFT inferior field;Requires cues, head turns, or add eye shifts to track Visual Fields: No apparent deficits Perception  Perception: Impaired Inattention/Neglect: Does not attend to left side of body Praxis Praxis: Intact Cognition Overall Cognitive Status: Impaired/Different from baseline Arousal/Alertness:  Awake/alert Orientation Level: Person;Place;Situation Person: Oriented Place: Oriented Situation: Oriented Year: 2023 Month: January Day of Week: Correct Memory: Impaired Memory Impairment: Retrieval deficit Immediate Memory Recall: Sock;Blue;Bed Memory Recall Sock: Without Cue Memory Recall Blue: Without Cue Memory Recall Bed: With Cue Awareness: Appears intact Problem Solving: Impaired Problem Solving Impairment: Verbal complex;Functional complex Safety/Judgment: Impaired Comments: Decreased knowledge of current deficits and decreased knowledge of need for external assist. Sensation Sensation Light Touch: Appears Intact Hot/Cold: Appears Intact Proprioception: Appears Intact Stereognosis: Not tested Coordination Gross Motor Movements are Fluid and Coordinated: No Fine Motor Movements are Fluid and Coordinated: No  Coordination and Movement Description: L hemi and global deconditioning/weakness post covid and hospitalization Finger Nose Finger Test: Slight deficits on R; uanble to assess on L 2/2 L hemi Heel Shin Test: UTA due to weakness Motor  Motor Motor: Hemiplegia;Abnormal postural alignment and control Motor - Skilled Clinical Observations: L hemi  Trunk/Postural Assessment  Cervical Assessment Cervical Assessment: Within Functional Limits Thoracic Assessment Thoracic Assessment:  (rounded shoulders and mild kyphosis) Lumbar Assessment Lumbar Assessment: Exceptions to [facility] (posterior pelvic tilt in sitting) Postural Control Postural Control: Deficits on evaluation (delayed and insufficient)  Balance Balance Balance Assessed: Yes Standardized Balance Assessment Standardized Balance Assessment: Berg Balance Test Berg Balance Test Sit to Stand: Needs moderate or maximal assist to stand Standing Unsupported: Unable to stand 30 seconds unassisted Sitting with Back Unsupported but Feet Supported on Floor or Stool: Able to sit 10 seconds Stand to Sit: Needs  assistance to sit Transfers: Needs one person to assist Standing Unsupported with Eyes Closed: Needs help to keep from falling Standing Ubsupported with Feet Together: Needs help to attain position and unable to hold for 15 seconds From Standing, Reach Forward with Outstretched Arm: Loses balance while trying/requires external support From Standing Position, Pick up Object from Floor: Unable to try/needs assist to keep balance From Standing Position, Turn to Look Behind Over each Shoulder: Needs assist to keep from losing balance and falling Turn 360 Degrees: Needs assistance while turning Standing Unsupported, Alternately Place Feet on Step/Stool: Needs assistance to keep from falling or unable to try Standing Unsupported, One Foot in Front: Loses balance while stepping or standing Standing on One Leg: Unable to try or needs assist to prevent fall Total Score: 2 Static Sitting Balance Static Sitting - Balance Support: No upper extremity supported Static Sitting - Level of Assistance: 4: Min assist;5: Stand by assistance Dynamic Sitting Balance Dynamic Sitting - Balance Support: Right upper extremity supported Dynamic Sitting - Level of Assistance: 4: Min Insurance risk surveyor Standing - Balance Support: No upper extremity supported Static Standing - Level of Assistance: 3: Mod assist Dynamic Standing Balance Dynamic Standing - Balance Support: No upper extremity supported Dynamic Standing - Level of Assistance: 3: Mod assist;2: Max assist Extremity/Trunk Assessment RUE Assessment RUE Assessment: Exceptions to [facility] Passive Range of Motion (PROM) Comments: WFL Active Range of Motion (AROM) Comments: Limited ROM at shoulder (Hx recent shoulder surgery); WFL at elbow, wrist and digits General Strength Comments: MMT 4/5 grossly LUE Assessment LUE Assessment: Exceptions to [facility] Active Range of Motion (AROM) Comments: ~30 degrees active shoulder flexion/abduction; ~80 degrees  active elbow flexion; trace movement at wrist/digits. General Strength Comments: MMT 2/5 - 2+/5 grossly LUE Body System: Neuro Brunstrum levels for arm and hand: Arm;Hand Brunstrum level for arm: Stage II Synergy is developing Brunstrum level for hand: Stage I Flaccidity  Care Tool Care Tool Self Care Eating   Eating Assist Level: Set up assist    Oral Care    Oral Care Assist Level: Moderate Assistance - Patient 50 - 74%    Bathing Bathing activity did not occur: Refused            Upper Body Dressing(including orthotics) Upper body dressing/undressing activity did not occur (including orthotics): Refused          Lower Body Dressing (excluding footwear) Lower body dressing activity did not occur: Refused        Putting on/Taking off footwear Putting on/taking off footwear activity did not occur: Refused  Care Tool Toileting Toileting activity   Assist for toileting: Moderate Assistance - Patient 50 - 74%     Care Tool Bed Mobility Roll left and right activity Roll left and right activity did not occur: N/A Roll left and right assist level: Maximal Assistance - Patient 25 - 49%    Sit to lying activity Sit to lying activity did not occur: N/A Sit to lying assist level: Maximal Assistance - Patient 25 - 49%    Lying to sitting on side of bed activity Lying to sitting on side of bed activity did not occur: the ability to move from lying on the back to sitting on the side of the bed with no back support.: N/A Lying to sitting on side of bed assist level: the ability to move from lying on the back to sitting on the side of the bed with no back support.: Maximal Assistance - Patient 25 - 49%     Care Tool Transfers Sit to stand transfer   Sit to stand assist level: Moderate Assistance - Patient 50 - 74%    Chair/bed transfer   Chair/bed transfer assist level: Moderate Assistance - Patient 50 - 74%     Toilet transfer   Assist Level: Moderate Assistance  - Patient 50 - 74%     Care Tool Cognition  Expression of Ideas and Wants Expression of Ideas and Wants: 3. Some difficulty - exhibits some difficulty with expressing needs and ideas (e.g, some words or finishing thoughts) or speech is not clear  Understanding Verbal and Non-Verbal Content Understanding Verbal and Non-Verbal Content: 3. Usually understands - understands most conversations, but misses some part/intent of message. Requires cues at times to understand   Memory/Recall Ability Memory/Recall Ability : That he or she is in a hospital/hospital unit   Refer to Care Plan for Long Term Goals  SHORT TERM GOAL WEEK 1 OT Short Term Goal 1 (Week 1): Patient will 1/3 parts of toileting task with Min A. OT Short Term Goal 2 (Week 1): Patient will complete stand-pivot to [facility] with Min A and LRAD. OT Short Term Goal 3 (Week 1): Patient will don LB clothing with Min A and good return demo of hemi technique. OT Short Term Goal 4 (Week 1): Patient will don UB clothing with Min A and good return demo of hemi technique.  Recommendations for other services: Other: TBD    Skilled Therapeutic Intervention Patient met seated in wc in agreement with OT treatment session. 0/10 pain reported at rest and with activity. Provided education on purpose of rehab, role of OT, ELOS and goals. Patient would benefit from continued education. Patient declined bathing/dressing but completed oral hygiene and 3/3 parts of toileting task. Mod-Max cues for sequencing/safety with ADL transfers and functional mobility. Please refer below for additional details. Patient is limited by L-hemi, L neglect/inattention, gait disturbance and increased need for external assist with ADLs/IADLs and would benefit from CIR level therapies to maximize safety/independence in prep for safe return home. Session concluded with patient seated in wc with call bell within reach, belt alarm activated and all needs met.   ADL ADL Eating: Not  assessed Grooming: Moderate assistance Where Assessed-Grooming: Sitting at sink Upper Body Bathing: Not assessed Lower Body Bathing: Not assessed Upper Body Dressing: Not assessed Lower Body Dressing: Not assessed Toileting: Moderate assistance Toilet Transfer: Moderate assistance Toilet Transfer Equipment: Radiographer, therapeutic: Not assessed Social research officer, government: Not assessed ADL Comments: Patient declined bathing/dressing at time of evaluation.  Mobility  Bed Mobility Bed Mobility: Rolling Right;Rolling Left;Supine to Sit Rolling Right: Maximal Assistance - Patient 25-49% Rolling Left: Moderate Assistance - Patient 50-74% Supine to Sit: Moderate Assistance - Patient 50-74% Transfers Sit to Stand: Moderate Assistance - Patient 50-74% Stand to Sit: Moderate Assistance - Patient 50-74%   Discharge Criteria: Patient will be discharged from OT if patient refuses treatment 3 consecutive times without medical reason, if treatment goals not met, if there is a change in medical status, if patient makes no progress towards goals or if patient is discharged from hospital.  The above assessment, treatment plan, treatment alternatives and goals were discussed and mutually agreed upon: by patient  Aylene Acoff R [place]-Davis 01/13/2021, 12:37 PM

## 2021-01-13 NOTE — Evaluation (Signed)
Speech Language Pathology Assessment and Plan  Patient Details  Name: Dalton Miller MRN: [id number] Date of Birth: 09-Dec-1945  SLP Diagnosis: Cognitive Impairments  Rehab Potential: Good ELOS: 2.5 weeks    Today's Date: 01/13/2021 SLP Individual Time: 1030-1100 SLP Individual Time Calculation (min): 30 min   Hospital Problem: Principal Problem:   Right middle cerebral artery stroke [facility])  Past Medical History:  Past Medical History:  Diagnosis Date   Acquired renal cyst of right kidney    Arthritis    Cancer ([place])    renal cell    Erectile dysfunction    Heart murmur    CHILDHOOD   Hematuria    left ureteral bleeding   History of cellulitis    2012 left wrist   History of kidney stones    Hyperlipidemia    Hypertension    Metastatic melanoma to lung, left ([place])    Nephrolithiasis    bilateral non-obstructive per ct 07-02-2015   Type 2 diabetes mellitus ([place])    Wears glasses    Past Surgical History:  Past Surgical History:  Procedure Laterality Date   CARPAL TUNNEL RELEASE Right 1990  approx   COLONOSCOPY  03/04/2010   CYSTOSCOPY W/ RETROGRADES Left 10/28/2015   Procedure: CYSTOSCOPY WITH LEFT RETROGRADE PYELOGRAM;  Surgeon: Cleon Gustin, MD;  Location: [facility];  Service: Urology;  Laterality: Left;   CYSTOSCOPY W/ URETERAL STENT PLACEMENT Left 10/28/2015   Procedure: CYSTOSCOPY WITH LEFT URETERAL STENT EXCHANGE;  Surgeon: Cleon Gustin, MD;  Location: [facility];  Service: Urology;  Laterality: Left;   CYSTOSCOPY WITH RETROGRADE PYELOGRAM, URETEROSCOPY AND STENT PLACEMENT Bilateral 09/17/2015   Procedure: CYSTOSCOPY WITH BILATERAL RETROGRADE PYELOGRAM, LEFT URETEROSCOPY WITH URETERAL BIOPSY AND  STENT PLACEMENT;  Surgeon: Irine Seal, MD;  Location: [facility];  Service: Urology;  Laterality: Bilateral;   ESOPHAGEAL BANDING  12/30/2020   Procedure: ESOPHAGEAL BANDING;  Surgeon: Ladene Artist, MD;  Location: [facility] ENDOSCOPY;  Service:  Endoscopy;;   ESOPHAGOGASTRODUODENOSCOPY (EGD) WITH PROPOFOL N/A 12/30/2020   Procedure: ESOPHAGOGASTRODUODENOSCOPY (EGD) WITH PROPOFOL;  Surgeon: Ladene Artist, MD;  Location: [facility] ENDOSCOPY;  Service: Endoscopy;  Laterality: N/A;   HOLMIUM LASER APPLICATION Left 17/61/6073   Procedure: HOLMIUM LASER APPLICATION;  Surgeon: Irine Seal, MD;  Location: [facility];  Service: Urology;  Laterality: Left;   KNEE ARTHROSCOPY Right 2008   MELANOMA EXCISION  2018   Left arm   right total knee replacement  Right    2009   ROBOT ASSITED LAPAROSCOPIC NEPHROURETERECTOMY Left 12/04/2015   Procedure: XI ROBOT ASSITED LAPAROSCOPIC NEPHROURETERECTOMY;  Surgeon: Cleon Gustin, MD;  Location: [facility];  Service: Urology;  Laterality: Left;   SHOULDER ARTHROSCOPY Right 12/17/2020   Procedure: Right shoulder arthroscopy, debridement, subacromial decompression, distal clavicle resection;  Surgeon: Justice Britain, MD;  Location: [facility];  Service: Orthopedics;  Laterality: Right;  51mn   STONE EXTRACTION WITH BASKET Left 09/17/2015   Procedure: STONE EXTRACTION WITH BASKET;  Surgeon: JIrine Seal MD;  [facility]  Service: Urology;  Laterality: Left;   TOTAL KNEE ARTHROPLASTY Right 2009   URETEROSCOPY  10/28/2015   Procedure: DIAGNOSTIC LEFT URETEROSCOPY;  Surgeon: PCleon Gustin MD;  Location: [facility];  Service: Urology;;   WISDOM TOOTH EXTRACTION      Assessment / Plan / Recommendation Clinical Impression Dalton BANASis a 76year old right-handed male with complicated medical history including melanoma with lung metastasis and urothelial carcinoma/renal cell cancer status  post nephrectomy, hypertension, hyperlipidemia,, recent shoulder surgery, type 2 diabetes mellitus, COPD/tobacco use, right TKA.  Per chart review lives with youngest son.  Modified independent prior to admission.  1 level home with ramped entrance.  Presented 12/29/2020 with altered mental status  after a fall to [facility], ER.  Admission chemistries unremarkable aside glucose 284, BUN 95, creatinine 1.77, AST 324, ALT 229, alkaline phosphatase 261, total bilirubin 3.6, WBC 40,700, hemoglobin 7.1, ammonia level 115, lactic acid greater than 9.0, blood cultures no growth to date, CK  357, COVID-19 positive.Dalton Miller  He was found to be obtunded and demonstrated disconjugate gaze as well as noted to be jaundiced.  He required intubation for airway protection due to hypoxia.  Family had noted complaints of abdominal pain and black tarry stools from the previous day.  Hemoccult was positive.  CT of the head showed ill-defined hypodensity involving the right basal ganglia and right frontal lobe concerning for acute right MCA territory infarct.  CT cervical spine negative for acute spine fracture or traumatic listhesis.  Ultrasound abdomen showed gallbladder wall thickening without evidence of cholelithiasis or additional findings to suggest the presence of acute cholecystitis.  Patient was transferred to [facility].  MRI/MRA 12/31/2020 scattered cortical and subcortical acute infarcts in the right MCA distribution with mild petechial hemorrhage.  70% stenosis of the right M1 segment.  Echocardiogram with ejection fraction of 50 to 55% no wall motion abnormalities grade 1 diastolic dysfunction.  Hospital course patient underwent upper GI endoscopy 12/30/2020 per Dr. Lucio Edward findings of grade 2 varices in the distal esophagus requiring banding some red blood clots in the gastric fundus.  Normal duodenal bulb and second portion of duodenum.  He did receive 1 unit packed red blood cells.  In regards to patient's COVID-19 was not able to take paxlovid or mulnupiravir due to LFT derangement and outside of window for benefit of steroids.  Continued isolation x10 days from positive test 12/27 and isolation has been completed.  Neurology follow-up for CVA currently maintained on low-dose aspirin for CVA  prophylaxis/no antithrombotic at this time secondary to GI bleed.  Patient was weaned from pressor support extubated 01/06/2020.  Rectal tube and Foley tube removed 01/11/2020.  His LFTs have markedly improved he remains on lactulose.  CT abdomen pelvis follow-up 01/05/2021 morphology feature liver suggestive of early cirrhosis moderate volume of ascites within the abdomen and pelvis.  There were 2 new low-attenuation structures within the dome of the liver.  These were new when compared to CT of 04/04/2019.  Cannot exclude benign or malignant liver lesions and recommendations were for outpatient follow-up.  He is tolerating a regular consistency diet.  WOC follow-up for MASD buttocks, perineal area, scrotum posterior medial thighs secondary to fecal incontinence with recommendations of routine skin care.  Therapy evaluations completed due to patient decreased functional mobility/multi medical was admitted for a comprehensive rehab program.  Pt presents with moderate-severe cognitive impairments, deficits include reduced basic/mildly complex problem solving, emergent awareness, short term recall, and left inattention, which appear to be primarily impacted by reduced alertness/sustained attention. SLP divided the evaluation session into two session due to fatigue impacting performance, however reduced attention continued to be a barrier. SLP administered formal cognitive linguistic assessment, Cognistat, pt scored WFL on all subsection except for severe impairment in construction task, moderate impairments in memory and mild impairments in calculations. Pt supports 8th grade education and calculation result reflecting baseline abilities. Pt demonstrated increase awareness of deficits throughout the session,  expressing surprise in acute changes in attention/vision on the left and reduced problem solving skills. Pt presents with no impairments in speech, nor expressed difficulty with swallowing skills. Pt would benefit from  skilled ST services in order to maximize functional independence and reduce burden of care, likely requiring 24 hour supervision at discharge with continued skilled ST services.     Skilled Therapeutic Interventions          Skilled ST services focused on cognitive skills. SLP facilitated administration of cognitive linguistic formal assessment and provided education of results. SLP and pt collaborated to set goals for cognitive linguistic needs during length of stay. SLP administered subsection of SLUMS, the clock drawing task. Pt initial listed all numbers on the left side only. SLP provided anchor cues and structure listing numbers 12, 3,6 and 9 to aid in organization and left scanning, however pt continued to require max A verbal cues to scan left of midline and space number appropriately. All questions answered to satisfaction.  Pt was left in room with call bell within reach and chair alarm set. SLP recommends to continue skilled services.    SLP Assessment  Patient will need skilled [facility] during CIR admission    Recommendations  Patient destination: Home Follow up Recommendations: Home Health SLP;24 hour supervision/assistance;Outpatient SLP Equipment Recommended: None recommended by SLP    SLP Frequency 3 to 5 out of 7 days   SLP Duration  SLP Intensity  SLP Treatment/Interventions 2.5 weeks  Minumum of 1-2 x/day, 30 to 90 minutes  Cognitive remediation/compensation;Cueing hierarchy;Medication managment;Patient/family education;Internal/external aids;Functional tasks    Pain Pain Assessment Pain Scale: 0-10 Pain Score: 0-No pain Faces Pain Scale: No hurt  Prior Functioning Type of Home: Mobile home  Lives With: Family;Other (Comment) (son) Available Help at Discharge: Family;Available 24 hours/day Vocation: Retired  Programmer, systems Overall Cognitive Status: Impaired/Different from baseline Arousal/Alertness:  Awake/alert Orientation Level: Oriented X4 (except for date) Year: 2023 Month: January Day of Week: Correct Memory: Impaired Memory Impairment: Retrieval deficit Immediate Memory Recall: Sock;Blue;Bed Memory Recall Sock: Without Cue Memory Recall Blue: Without Cue Memory Recall Bed: With Cue Awareness: Impaired Awareness Impairment: Emergent impairment Problem Solving: Impaired Problem Solving Impairment: Verbal complex;Functional complex;Verbal basic;Functional basic Safety/Judgment: Impaired Comments: Decreased knowledge of current deficits and decreased knowledge of need for external assist.  Comprehension Auditory Comprehension Overall Auditory Comprehension: Appears within functional limits for tasks assessed Yes/No Questions: Within Functional Limits Commands: Within Functional Limits Expression Expression Primary Mode of Expression: Verbal Verbal Expression Overall Verbal Expression: Appears within functional limits for tasks assessed Written Expression Dominant Hand: Right Oral Motor Oral Motor/Sensory Function Overall Oral Motor/Sensory Function: Within functional limits Motor Speech Overall Motor Speech: Appears within functional limits for tasks assessed  Care Tool Care Tool Cognition Ability to hear (with hearing aid or hearing appliances if normally used Ability to hear (with hearing aid or hearing appliances if normally used): 1. Minimal difficulty - difficulty in some environments (e.g. when person speaks softly or setting is noisy)   Expression of Ideas and Wants Expression of Ideas and Wants: 3. Some difficulty - exhibits some difficulty with expressing needs and ideas (e.g, some words or finishing thoughts) or speech is not clear   Understanding Verbal and Non-Verbal Content Understanding Verbal and Non-Verbal Content: 3. Usually understands - understands most conversations, but misses some part/intent of message. Requires cues at times to understand   Memory/Recall Ability Memory/Recall Ability : That he or she is in a hospital/hospital unit  Short Term Goals: Week 1: SLP Short Term Goal 1 (Week 1): Pt will demonstrate alertness and sustained attention during functional task for 5 minutes with mod A verbal cues in 75% of opportunties. SLP Short Term Goal 2 (Week 1): Pt will demonstrate basic to mildly complex problem solving skills in fucntional tasks with mod A verbal cues. SLP Short Term Goal 3 (Week 1): Pt will demonstrate self monitoring of functional errors in problem solving tasks with mod A multimodal cues. SLP Short Term Goal 4 (Week 1): Pt will demonstrate recall of daily and novel information with mod A verbal cues to utilize compensatory aids. SLP Short Term Goal 5 (Week 1): Pt will scan left of midline in functional tasks to locate objects with 60% accuracy given max A multimodal cues.  Refer to Care Plan for Long Term Goals  Recommendations for other services: None   Discharge Criteria: Patient will be discharged from SLP if patient refuses treatment 3 consecutive times without medical reason, if treatment goals not met, if there is a change in medical status, if patient makes no progress towards goals or if patient is discharged from hospital.  The above assessment, treatment plan, treatment alternatives and goals were discussed and mutually agreed upon: by patient  Mardell Suttles  [facility] 01/13/2021, 12:46 PM

## 2021-01-13 NOTE — Progress Notes (Signed)
PROGRESS NOTE   Subjective/Complaints: No new complaints today.  Discussed his one kidney, importance of maintaining its good health, encouraged drinking 6-8 glasses of water per day  Objective:   No results found. Recent Labs    01/13/21 0544  WBC 11.8*  HGB 9.9*  HCT 31.4*  PLT 143*   Recent Labs    01/13/21 0544  NA 145  K 4.2  CL 115*  CO2 22  GLUCOSE 126*  BUN 20  CREATININE 1.38*  CALCIUM 7.7*   No intake or output data in the 24 hours ending 01/13/21 1004      Physical Exam: Vital Signs Blood pressure (!) 143/73, pulse 84, temperature 98 F (36.7 C), temperature source Oral, resp. rate 18, height 5\' 7"  (1.702 m), weight 87.6 kg, SpO2 98 %. Gen: no distress, normal appearing HEENT: oral mucosa pink and moist, NCAT Cardio: Reg rate Chest: normal effort, normal rate of breathing Abd: soft, non-distended Ext: no edema Psych: pleasant, normal affect Skin: intact Neurological:     Mental Status: He is alert.     Comments: Patient is alert.  Makes eye contact with examiner.  Provides name and age but limited medical historian. Normal language. Speech sl dysarthric. Follows simple commands. Left central 7 and tongue deviation. Right gaze preference.  LUE 2 to 2+/5. LLE 3 to 3+/5. RUE and RLE 4 to 4+/5. No obvious sensory deficits. No limb ataxia apparent. Normal resting tone and DTR's 1+.     Assessment/Plan: 1. Functional deficits which require 3+ hours per day of interdisciplinary therapy in a comprehensive inpatient rehab setting. Physiatrist is providing close team supervision and 24 hour management of active medical problems listed below. Physiatrist and rehab team continue to assess barriers to discharge/monitor patient progress toward functional and medical goals  Care Tool:  Bathing              Bathing assist       Upper Body Dressing/Undressing Upper body dressing        Upper body  assist      Lower Body Dressing/Undressing Lower body dressing            Lower body assist       Toileting Toileting    Toileting assist Assist for toileting: Maximal Assistance - Patient 25 - 49%     Transfers Chair/bed transfer  Transfers assist           Locomotion Ambulation   Ambulation assist              Walk 10 feet activity   Assist           Walk 50 feet activity   Assist           Walk 150 feet activity   Assist           Walk 10 feet on uneven surface  activity   Assist           Wheelchair     Assist               Wheelchair 50 feet with 2 turns activity    Assist  Wheelchair 150 feet activity     Assist          Blood pressure (!) 143/73, pulse 84, temperature 98 F (36.7 C), temperature source Oral, resp. rate 18, height 5\' 7"  (1.702 m), weight 87.6 kg, SpO2 98 %.    Medical Problem List and Plan: 1. Functional deficits  disconjugate gaze with altered mental status/acute hepatic metabolic encephalopathy/multifactorial secondary to right MCA scattered infarcts             -patient may shower             -ELOS/Goals: 14-17 days, min assist goals with PT, OT, SLP  --Interdisciplinary Team Conference today   2.  Antithrombotics: -DVT/anticoagulation:  Mechanical: Antiembolism stockings, thigh (TED hose) Bilateral lower extremities             -antiplatelet therapy: Aspirin 81 mg daily 3. Pain Management: Tylenol held due to elevated LFTs 4. Mood: Provide emotional support             -antipsychotic agents: N/A 5. Neuropsych: This patient is capable of making decisions on his own behalf. 6. Skin/Wound Care: Routine skin checks 7. Fluids/Electrolytes/Nutrition: Routine in and outs with follow-up chemistries 8.  Acute blood loss anemia due to upper GI bleed esophageal varices.  Status post banding x4 12/30/2020.   -Continue Protonix twice daily.   -serial  hgb's -Follow-up per GI services 9.  Acute hypoxic respiratory failure.  Extubated 01/06/2020 10.  Metastatic recurrent melanoma/left upper lobe lesion, mediastinal adenopathy.  Status post 4 cycles ipo.nivo.  Follow-up outpatient             -currently on oxygen. Wean to off as able 10.  COVID-19 infection.  Appeared incidental.  Isolation completed. 11.  Recurrent low-grade urothelial carcinoma.  Status post TURBT x3.  Follow-up outpatient.   12.  Elevated LFTs/hepatic cirrhosis.  Markedly improved.  Continue lactulose.  Holding Lasix/Spiro due to AKI 13.  AKI.  Status post solitary kidney status post nephrectomy due to [place]. Encouraged 6-8 glasses of water per day.  14.  Diabetes mellitus.  Hemoglobin A1c 7.6.  SSI.             -borderline to fair control at present. Provided dietary education.  15.  Hypertension.  Norvasc 10 mg daily.  Monitor with increased mobility 16.  Hyperlipidemia.  Lipitor resumed 10 mg daily 17. Obesity BMI 30.25: provided dietary education.     LOS: 1 days A FACE TO FACE EVALUATION WAS PERFORMED  Arlethia Basso P Asanti Craigo 01/13/2021, 10:04 AM

## 2021-01-13 NOTE — Progress Notes (Signed)
[facility] Individual Statement of Services  Patient Name:  Dalton Miller  Date:  01/13/2021  Welcome to the [facility].  Our goal is to provide you with an individualized program based on your diagnosis and situation, designed to meet your specific needs.  With this comprehensive rehabilitation program, you will be expected to participate in at least 3 hours of rehabilitation therapies Monday-Friday, with modified therapy programming on the weekends.  Your rehabilitation program will include the following services:  Physical Therapy (PT), Occupational Therapy (OT), Speech Therapy (ST), 24 hour per day rehabilitation nursing, Neuropsychology, Care Coordinator, Rehabilitation Medicine, Nutrition Services, and Pharmacy Services  Weekly team conferences will be held on Wednesday to discuss your progress.  Your Inpatient Rehabilitation Care Coordinator will talk with you frequently to get your input and to update you on team discussions.  Team conferences with you and your family in attendance may also be held.  Expected length of stay: 2.5 weeks  Overall anticipated outcome: overall min assist level  Depending on your progress and recovery, your program may change. Your Inpatient Rehabilitation Care Coordinator will coordinate services and will keep you informed of any changes. Your Inpatient Rehabilitation Care Coordinator's name and contact numbers are listed  below.  The following services may also be recommended but are not provided by the [facility] will be made to provide these services after discharge if needed.  Arrangements include referral to agencies that provide these services.  Your insurance has been verified to be:  [place] [employer] Your primary doctor is:  [place]  Pertinent information will be  shared with your doctor and your insurance company.  Inpatient Rehabilitation Care Coordinator:  Ovidio Kin, [place] or Emilia Beck  Information discussed with and copy given to patient by: Elease Hashimoto, 01/13/2021, 10:42 AM

## 2021-01-13 NOTE — Progress Notes (Signed)
Gave patient CHG bath

## 2021-01-13 NOTE — Progress Notes (Signed)
Physical Therapy Session Note  Patient Details  Name: Dalton Miller MRN: [id number] Date of Birth: 1945-06-26  Today's Date: 01/13/2021 PT Individual Time: 1400-1430 PT Individual Time Calculation (min): 30 min   Short Term Goals: Week 1:  PT Short Term Goal 1 (Week 1): Pt will complete bed mobility with minA and no hospital bed features PT Short Term Goal 2 (Week 1): Pt will complete bed<>chair transfers with modA and LRAD PT Short Term Goal 3 (Week 1): Pt will ambulate 72f with modA and LRAD PT Short Term Goal 4 (Week 1): Pt will improve BERG score >7 points  Skilled Therapeutic Interventions/Progress Updates:    Pt presents sidelying R in bed with daughter in law at the bedside. Pt appearing fatigue from busy day of therapies. He denies pain but is agreeable to PT tx. Due to high level of fatigue, focused session on family ed, confirming PLOF/social factors, and confirming DC plan. Daughter in law reports plan to DC to his home where she and his son will provide 24/7 S/A. Confirms ramped entrance with 1 lvl home. Education provided on [employer] therapy schedules, PT goals, and PT POC. Pt requests to remain in bed during therapy session. [facility] for time and energy conservation. Required modA for rolling L and positioned in sidelying on L for pressure relief and to promote L attention. Rearranged room to promote L attention - ed to daughter in law on sitting to his L. Daughter in law appearing very supportive and understanding of PT POC. Concluded session sidelying L with pillow support, all needs met, bed alarm on.   Therapy Documentation Precautions:  Precautions Precautions: Fall Precaution Comments: L inattention; L hemi; post covid; recent R shoulder arthroscopy and debridement (12/17/20) Restrictions Weight Bearing Restrictions: No General: Chart Reviewed: Yes Family/Caregiver Present: No  Therapy/Group: Individual Therapy  CAlger Simons1/11/2021, 12:39 PM

## 2021-01-13 NOTE — Progress Notes (Signed)
Inpatient Rehabilitation Care Coordinator Assessment and Plan Patient Details  Name: Dalton Miller MRN: [id number] Date of Birth: Jun 03, 1945  Today's Date: 01/13/2021  Hospital Problems: Principal Problem:   Right middle cerebral artery stroke [facility])  Past Medical History:  Past Medical History:  Diagnosis Date   Acquired renal cyst of right kidney    Arthritis    Cancer ([place])    renal cell    Erectile dysfunction    Heart murmur    CHILDHOOD   Hematuria    left ureteral bleeding   History of cellulitis    2012 left wrist   History of kidney stones    Hyperlipidemia    Hypertension    Metastatic melanoma to lung, left ([place])    Nephrolithiasis    bilateral non-obstructive per ct 07-02-2015   Type 2 diabetes mellitus ([place])    Wears glasses    Past Surgical History:  Past Surgical History:  Procedure Laterality Date   CARPAL TUNNEL RELEASE Right 1990  approx   COLONOSCOPY  03/04/2010   CYSTOSCOPY W/ RETROGRADES Left 10/28/2015   Procedure: CYSTOSCOPY WITH LEFT RETROGRADE PYELOGRAM;  Surgeon: Cleon Gustin, MD;  Location: [facility];  Service: Urology;  Laterality: Left;   CYSTOSCOPY W/ URETERAL STENT PLACEMENT Left 10/28/2015   Procedure: CYSTOSCOPY WITH LEFT URETERAL STENT EXCHANGE;  Surgeon: Cleon Gustin, MD;  Location: [facility];  Service: Urology;  Laterality: Left;   CYSTOSCOPY WITH RETROGRADE PYELOGRAM, URETEROSCOPY AND STENT PLACEMENT Bilateral 09/17/2015   Procedure: CYSTOSCOPY WITH BILATERAL RETROGRADE PYELOGRAM, LEFT URETEROSCOPY WITH URETERAL BIOPSY AND  STENT PLACEMENT;  Surgeon: Irine Seal, MD;  Location: [facility];  Service: Urology;  Laterality: Bilateral;   ESOPHAGEAL BANDING  12/30/2020   Procedure: ESOPHAGEAL BANDING;  Surgeon: Ladene Artist, MD;  Location: [facility];  Service: Endoscopy;;   ESOPHAGOGASTRODUODENOSCOPY (EGD) WITH PROPOFOL N/A 12/30/2020   Procedure: ESOPHAGOGASTRODUODENOSCOPY (EGD) WITH PROPOFOL;  Surgeon: Ladene Artist, MD;  Location: [facility] ENDOSCOPY;  Service: Endoscopy;  Laterality: N/A;   HOLMIUM LASER APPLICATION Left 05/39/7673   Procedure: HOLMIUM LASER APPLICATION;  Surgeon: Irine Seal, MD;  Location: [facility];  Service: Urology;  Laterality: Left;   KNEE ARTHROSCOPY Right 2008   MELANOMA EXCISION  2018   Left arm   right total knee replacement  Right    2009   ROBOT ASSITED LAPAROSCOPIC NEPHROURETERECTOMY Left 12/04/2015   Procedure: XI ROBOT ASSITED LAPAROSCOPIC NEPHROURETERECTOMY;  Surgeon: Cleon Gustin, MD;  Location: [facility];  Service: Urology;  Laterality: Left;   SHOULDER ARTHROSCOPY Right 12/17/2020   Procedure: Right shoulder arthroscopy, debridement, subacromial decompression, distal clavicle resection;  Surgeon: Justice Britain, MD;  Location: [facility];  Service: Orthopedics;  Laterality: Right;  54min   STONE EXTRACTION WITH BASKET Left 09/17/2015   Procedure: STONE EXTRACTION WITH BASKET;  Surgeon: Irine Seal, MD;  Location: [facility];  Service: Urology;  Laterality: Left;   TOTAL KNEE ARTHROPLASTY Right 2009   URETEROSCOPY  10/28/2015   Procedure: DIAGNOSTIC LEFT URETEROSCOPY;  Surgeon: Cleon Gustin, MD;  Location: [facility];  Service: Urology;;   WISDOM TOOTH EXTRACTION     Social History:  reports that he has been smoking cigars and cigarettes. He has a 94.50 pack-year smoking history. He quit smokeless tobacco use about 28 years ago.  His smokeless tobacco use included chew. He reports that he does not drink alcohol and does not use drugs.  Family / Support Systems Marital Status: Widow/Widower Patient  Roles: Parent Children: Dawn-daughter [phone]  Hulan Fess [phone] and Karie Soda [phone]  Donnie-son Other Supports: Friends and neighbors Anticipated Caregiver: Dawn and his son;s plan to rotate his care Ability/Limitations of Caregiver: Plan to provide 24/7 Donnie does live with pt and is not employed at this time Building control surveyor  Availability: 24/7 Family Dynamics: Close knit family who will pull together to assist their Dad, as he would them if needed. He has always been independent and taken care of himself and hopes to be able to dso this again  Social History Preferred language: English Religion: None Cultural Background: No issues Education: [employer] - How often do you need to have someone help you when you read instructions, pamphlets, or other written material from your doctor or pharmacy?: Never Writes: Yes Employment Status: Retired Public relations account executive Issues: No issues Guardian/Conservator: None-according to MD pt is capable of making his own decisions while here   Abuse/Neglect Abuse/Neglect Assessment Can Be Completed: Yes Physical Abuse: Denies Verbal Abuse: Denies Sexual Abuse: Denies Exploitation of patient/patient's resources: Denies Self-Neglect: Denies  Patient response to: Social Isolation - How often do you feel lonely or isolated from those around you?: Never  Emotional Status Pt's affect, behavior and adjustment status: Pt is motivated and thankful he is here. He has been through quite a lot since shoulder surgery. He is one who rolls with it and feels is doing as well as expected with all that has happened to him since coming to the hospital. Recent Psychosocial Issues: other health issues recent shoulder surgery 12/17/2020 Psychiatric History: No history do feel with all pt has happen he would benefit from seeing neuro-psych while here. He is tough and feels he can soldier through as he always has done. Substance Abuse History: No issues  Patient / Family Perceptions, Expectations & Goals Pt/Family understanding of illness & functional limitations: Pt and daughter seem to have a good understanding of his condition and recent medical issues-GI bleed, CVA and COVID. Both are amazed how well he is doing and glad he is on rehab now to get the therapy he needs. Premorbid  pt/family roles/activities: Dad, retiree, veteran, grandfather, neighbor, etc Anticipated changes in roles/activities/participation: resume Pt/family expectations/goals: Pt states: " I hope to do well and get moving while here. I have a lot going on."  Daughter states: " We all hope he does well but will do what we need to do for him."  [employer]: Other (Comment) [facility] [place] Premorbid Home Care/DME Agencies: Other (Comment) ([facility] for OT prior to admission Has cane and bsc) Transportation available at discharge: Family members-pt was driving prior to shoulder surgery Is the patient able to respond to transportation needs?: Yes In the past 12 months, has lack of transportation kept you from medical appointments or from getting medications?: No In the past 12 months, has lack of transportation kept you from meetings, work, or from getting things needed for daily living?: No Resource referrals recommended: Neuropsychology  Discharge Planning Living Arrangements: Children Support Systems: Children, Other relatives, Friends/neighbors Type of Residence: Private residence Insurance Resources: Multimedia programmer (specify) ([employer] and Psychologist, prison and probation services) Financial Resources: Social Security Financial Screen Referred: No Living Expenses: Own Money Management: Patient Does the patient have any problems obtaining your medications?: No Home Management: Family has assisted since shoulder surgery 12/2020 Patient/Family Preliminary Plans: Return home with children assisting with his care. They plan to rotate and work out 24/7 care. His youngest son-Donnie lives with him and is not working at  this time. Aware team conference today and may not be able to set a target DC date due to being evaluated today. Care Coordinator Barriers to Discharge: Insurance for SNF coverage Care Coordinator Anticipated Follow Up Needs: HH/OP  Clinical Impression Pleasant gentleman who has had  much happen to him in a short period of time. Had shoulder surgery and then was admitted with GI bleed, CVA and COVID. He is a strong willed person and will push himself to do well here. He has very involved children who are willing to assist him at discharge. Will await team evaluations and work on discharge needs. Do feel would benefit from seeing neruo-psych whole here, will place on list.  Elease Hashimoto 01/13/2021, 10:40 AM

## 2021-01-13 NOTE — Patient Care Conference (Cosign Needed)
Inpatient RehabilitationTeam Conference and Plan of Care Update Date: 01/13/2021   Time: 11:43 AM    Patient Name: Dalton Miller      Medical Record Number: [id number]  Date of Birth: 03-19-1945 Sex: Male         Room/Bed: 5C20C/5C20C-01 Payor Info: Payor: [employer] / Plan: [place] / Product Type: *No Product type* /    Admit Date/Time:  01/12/2021  2:22 PM  Primary Diagnosis:  Right middle cerebral artery stroke [facility])  Hospital Problems: Principal Problem:   Right middle cerebral artery stroke [facility])    Expected Discharge Date: Expected Discharge Date:  (2.5 ELOS; eval pending)  Team Members Present: Physician leading conference: Dr. Leeroy Cha Social Worker Present: Ovidio Kin, LCSW Nurse Present: Dorien Chihuahua, RN PT Present: Ginnie Smart, PT OT Present: Willeen Cass, OT SLP Present: Charolett Bumpers, SLP PPS Coordinator present : Gunnar Fusi, SLP     Current Status/Progress Goal Weekly Team Focus  Bowel/Bladder   Pt is incontinent of bowel/bladder  Pt will continence of bowel/bladder  Will assess qshift and PRN   Swallow/Nutrition/ Hydration             ADL's   eval pending         Mobility      EVAL PENDING      Communication             Safety/Cognition/ Behavioral Observations  Max-Mod A  Min A  sustained attention/alertness, left scanning, basic/mild problem solving, emergent awareness and recall   Pain   Pt is currently pain free  Pt will remain pain free  Will assess qshift and PRN   Skin   Pt's buttocks is excoriated  Pt's buttocks will heal  Will assess qshift and PRN     Discharge Planning:  New evaluation today-plan home with children taking turns rotating his care-will confirm today   Team Discussion: Patient post Right MCA CVA. Hx of renal cancer with nephrectomy, AKI improved. Requiring oxygen, Recent GI bleed but WBC are stable per MD. Progress limited by fatigue, left inattention, right gaze  preference, decreased error awareness, memory but is alert and oriented.  Patient on target to meet rehab goals: yes, currently mod - max assist overall. Needs mod assist to sit up unsupported. Need max assist for squat pivot transfers. Goals for discharge set for min assist.  *See Care Plan and progress notes for long and short-term goals.   Revisions to Treatment Plan:  Memory notebook Rest breaks incorporated into schedule for poor activity tolerance level  Teaching Needs: Safety, transfers, toileting, medications, secondary risk management,skin care, etc.   Current Barriers to Discharge: Decreased caregiver support  Possible Resolutions to Barriers: Family education     Medical Summary Current Status: diarrhea, s/p nephrectomy with AKI, leukocytosis, acute blood loss anemia secondary to GI bleed, R MCA stroke, requiring oxygen, tachypnea  Barriers to Discharge: Medical stability  Barriers to Discharge Comments: diarrhea, s/p nephrectomy with AKI, leukocytosis, acute blood loss anemia secondary to GI bleed, R MCA stroke, requiring oxygen, tachypnea Possible Resolutions to [employer]: consider adjustment of lactulose if possible, continue to monitor creatinine, encourage 6-8 glasses of water per day, continue to monitor hemoglobin   Continued Need for Acute Rehabilitation Level of Care: The patient requires daily medical management by a physician with specialized training in physical medicine and rehabilitation for the following reasons: Direction of a multidisciplinary physical rehabilitation program to maximize functional independence : Yes Medical management of  patient stability for increased activity during participation in an intensive rehabilitation regime.: Yes Analysis of laboratory values and/or radiology reports with any subsequent need for medication adjustment and/or medical intervention. : Yes   I attest that I was present, lead the team conference, and  concur with the assessment and plan of the team.   Dorien Chihuahua B 01/13/2021, 11:43 AM

## 2021-01-13 NOTE — Evaluation (Signed)
Physical Therapy Assessment and Plan  Patient Details  Name: Dalton Miller MRN: [id number] Date of Birth: 1945-08-20  PT Diagnosis: Abnormal posture, Abnormality of gait, Cognitive deficits, Difficulty walking, Hemiparesis non-dominant, Impaired cognition, and Muscle weakness Rehab Potential: Good ELOS: 2.5 weeks   Today's Date: 01/13/2021 PT Individual Time: 0800-0900 PT Individual Time Calculation (min): 60 min    Hospital Problem: Principal Problem:   Right middle cerebral artery stroke [facility])   Past Medical History:  Past Medical History:  Diagnosis Date   Acquired renal cyst of right kidney    Arthritis    Cancer ([place])    renal cell    Erectile dysfunction    Heart murmur    CHILDHOOD   Hematuria    left ureteral bleeding   History of cellulitis    2012 left wrist   History of kidney stones    Hyperlipidemia    Hypertension    Metastatic melanoma to lung, left ([place])    Nephrolithiasis    bilateral non-obstructive per ct 07-02-2015   Type 2 diabetes mellitus ([place])    Wears glasses    Past Surgical History:  Past Surgical History:  Procedure Laterality Date   CARPAL TUNNEL RELEASE Right 1990  approx   COLONOSCOPY  03/04/2010   CYSTOSCOPY W/ RETROGRADES Left 10/28/2015   Procedure: CYSTOSCOPY WITH LEFT RETROGRADE PYELOGRAM;  Surgeon: Cleon Gustin, MD;  Location: [facility];  Service: Urology;  Laterality: Left;   CYSTOSCOPY W/ URETERAL STENT PLACEMENT Left 10/28/2015   Procedure: CYSTOSCOPY WITH LEFT URETERAL STENT EXCHANGE;  Surgeon: Cleon Gustin, MD;  Location: [facility];  Service: Urology;  Laterality: Left;   CYSTOSCOPY WITH RETROGRADE PYELOGRAM, URETEROSCOPY AND STENT PLACEMENT Bilateral 09/17/2015   Procedure: CYSTOSCOPY WITH BILATERAL RETROGRADE PYELOGRAM, LEFT URETEROSCOPY WITH URETERAL BIOPSY AND  STENT PLACEMENT;  Surgeon: Irine Seal, MD;  Location: [facility];  Service: Urology;  Laterality: Bilateral;   ESOPHAGEAL BANDING  12/30/2020    Procedure: ESOPHAGEAL BANDING;  Surgeon: Ladene Artist, MD;  Location: [facility] ENDOSCOPY;  Service: Endoscopy;;   ESOPHAGOGASTRODUODENOSCOPY (EGD) WITH PROPOFOL N/A 12/30/2020   Procedure: ESOPHAGOGASTRODUODENOSCOPY (EGD) WITH PROPOFOL;  Surgeon: Ladene Artist, MD;  Location: [facility] ENDOSCOPY;  Service: Endoscopy;  Laterality: N/A;   HOLMIUM LASER APPLICATION Left 60/10/9321   Procedure: HOLMIUM LASER APPLICATION;  Surgeon: Irine Seal, MD;  Location: [facility];  Service: Urology;  Laterality: Left;   KNEE ARTHROSCOPY Right 2008   MELANOMA EXCISION  2018   Left arm   right total knee replacement  Right    2009   ROBOT ASSITED LAPAROSCOPIC NEPHROURETERECTOMY Left 12/04/2015   Procedure: XI ROBOT ASSITED LAPAROSCOPIC NEPHROURETERECTOMY;  Surgeon: Cleon Gustin, MD;  Location: [facility];  Service: Urology;  Laterality: Left;   SHOULDER ARTHROSCOPY Right 12/17/2020   Procedure: Right shoulder arthroscopy, debridement, subacromial decompression, distal clavicle resection;  Surgeon: Justice Britain, MD;  Location: [facility];  Service: Orthopedics;  Laterality: Right;  73mn   STONE EXTRACTION WITH BASKET Left 09/17/2015   Procedure: STONE EXTRACTION WITH BASKET;  Surgeon: JIrine Seal MD;  [facility]  Service: Urology;  Laterality: Left;   TOTAL KNEE ARTHROPLASTY Right 2009   URETEROSCOPY  10/28/2015   Procedure: DIAGNOSTIC LEFT URETEROSCOPY;  Surgeon: PCleon Gustin MD;  Location: [facility];  Service: Urology;;   WISDOM TOOTH EXTRACTION      Assessment & Plan Clinical Impression: Patient is a 76year old right-handed male with complicated medical history including melanoma  with lung metastasis and urothelial carcinoma/renal cell cancer status post nephrectomy, hypertension, hyperlipidemia,, recent shoulder surgery, type 2 diabetes mellitus, COPD/tobacco use, right TKA.  Per chart review lives with youngest son.  Modified independent prior to admission.  1  level home with ramped entrance.  Presented 12/29/2020 with altered mental status after a fall to [facility] ER.  Admission chemistries unremarkable aside glucose 284, BUN 95, creatinine 1.77, AST 324, ALT 229, alkaline phosphatase 261, total bilirubin 3.6, WBC 40,700, hemoglobin 7.1, ammonia level 115, lactic acid greater than 9.0, blood cultures no growth to date, CK  357, COVID-19 positive.[name]  He was found to be obtunded and demonstrated disconjugate gaze as well as noted to be jaundiced.  He required intubation for airway protection due to hypoxia.  Family had noted complaints of abdominal pain and black tarry stools from the previous day.  Hemoccult was positive.  CT of the head showed ill-defined hypodensity involving the right basal ganglia and right frontal lobe concerning for acute right MCA territory infarct.  CT cervical spine negative for acute spine fracture or traumatic listhesis.  Ultrasound abdomen showed gallbladder wall thickening without evidence of cholelithiasis or additional findings to suggest the presence of acute cholecystitis.  Patient was transferred to [facility].  MRI/MRA 12/31/2020 scattered cortical and subcortical acute infarcts in the right MCA distribution with mild petechial hemorrhage.  70% stenosis of the right M1 segment.  Echocardiogram with ejection fraction of 50 to 55% no wall motion abnormalities grade 1 diastolic dysfunction.  Hospital course patient underwent upper GI endoscopy 12/30/2020 per Dr. Lucio Edward findings of grade 2 varices in the distal esophagus requiring banding some red blood clots in the gastric fundus.  Normal duodenal bulb and second portion of duodenum.  He did receive 1 unit packed red blood cells.  In regards to patient's COVID-19 was not able to take paxlovid or mulnupiravir due to LFT derangement and outside of window for benefit of steroids.  Continued isolation x10 days from positive test 12/27 and isolation has been completed.   Neurology follow-up for CVA currently maintained on low-dose aspirin for CVA prophylaxis/no antithrombotic at this time secondary to GI bleed.  Patient was weaned from pressor support extubated 01/06/2020.  Rectal tube and Foley tube removed 01/11/2020.  His LFTs have markedly improved he remains on lactulose.  CT abdomen pelvis follow-up 01/05/2021 morphology feature liver suggestive of early cirrhosis moderate volume of ascites within the abdomen and pelvis.  There were 2 new low-attenuation structures within the dome of the liver.  These were new when compared to CT of 04/04/2019.  Cannot exclude benign or malignant liver lesions and recommendations were for outpatient follow-up.  He is tolerating a regular consistency diet.  WOC follow-up for MASD buttocks, perineal area, scrotum posterior medial thighs secondary to fecal incontinence with recommendations of routine skin care.  Therapy evaluations completed due to patient decreased functional mobility/multi medical was admitted for a comprehensive rehab program.  Patient transferred to CIR on 01/12/2021 .   Patient currently requires mod with mobility secondary to muscle weakness, decreased cardiorespiratoy endurance and decreased oxygen support, unbalanced muscle activation and motor apraxia, decreased attention to left, decreased initiation, decreased problem solving, and decreased memory, and decreased sitting balance, decreased standing balance, decreased postural control, hemiplegia, and decreased balance strategies.  Prior to hospitalization, patient was independent  with mobility and lived with Family, Other (Comment) (son) in a Mobile home home.  Home access is  Ramped entrance.  Patient will benefit  from skilled PT intervention to maximize safe functional mobility, minimize fall risk, and decrease caregiver burden for planned discharge home with 24 hour assist.  Anticipate patient will benefit from follow up [facility] at discharge.  PT - End of Session Activity  Tolerance: Tolerates < 10 min activity with changes in vital signs Endurance Deficit: Yes Endurance Deficit Description: Oxygen desaturation while on 2L to 85% with simple bed mobility. Requires prolonged rest breaks for recovery and during simple functional mobility tasks PT Assessment Rehab Potential (ACUTE/IP ONLY): Good PT Barriers to Discharge: Decreased caregiver support;Home environment access/layout;Insurance for SNF coverage;New oxygen PT Patient demonstrates impairments in the following area(s): Balance;Endurance;Motor;Safety;Skin Integrity PT Transfers Functional Problem(s): Bed Mobility;Bed to Chair;Car;Furniture PT Locomotion Functional Problem(s): Ambulation;Wheelchair Mobility;Stairs PT Plan PT Intensity: Minimum of 1-2 x/day ,45 to 90 minutes PT Frequency: 5 out of 7 days;Total of 15 hours over 7 days of combined therapies PT Duration Estimated Length of Stay: 2.5 weeks PT Treatment/Interventions: Ambulation/gait training;Balance/vestibular training;Cognitive remediation/compensation;Community reintegration;Discharge planning;Disease management/prevention;DME/adaptive equipment instruction;Functional mobility training;Neuromuscular re-education;Functional electrical stimulation;Pain management;Patient/family education;Skin care/wound management;Psychosocial support;Splinting/orthotics;Stair training;Therapeutic Activities;Therapeutic Exercise;UE/LE Strength taining/ROM;UE/LE Coordination activities;Visual/perceptual remediation/compensation;Wheelchair propulsion/positioning PT Transfers Anticipated Outcome(s): minA with LRAD PT Locomotion Anticipated Outcome(s): minA with LRAD PT Recommendation Follow Up Recommendations: 24 hour supervision/assistance Patient destination: Home Equipment Recommended: To be determined   PT Evaluation Precautions/Restrictions Precautions Precautions: Fall Precaution Comments: L inattention; L hemi; post covid; recent R shoulder arthroscopy and  debridement (12/17/20) Restrictions Weight Bearing Restrictions: No General Chart Reviewed: Yes Family/Caregiver Present: No  Pain Pain Assessment Pain Scale: 0-10 Pain Score: 0-No pain Faces Pain Scale: No hurt Pain Interference Pain Interference Pain Effect on Sleep: 1. Rarely or not at all Pain Interference with Therapy Activities: 1. Rarely or not at all Pain Interference with Day-to-Day Activities: 1. Rarely or not at all  Home Living/Prior [place] Living Arrangements: Children Available Help at Discharge: Family;Available 24 hours/day Type of Home: Mobile home Home Access: Ramped entrance Home Layout: One level Bathroom Shower/Tub: Chiropodist: Standard Bathroom Accessibility: Yes  Lives With: Family;Other (Comment) (son) Prior Function Level of Independence: Independent with basic ADLs;Independent with homemaking with ambulation Driving: Yes Vocation: Retired Leisure: Hobbies-yes (Comment) (enjoys fishing) Vision/Perception  Vision - History Ability to See in Adequate Light: 0 Adequate Vision - Assessment Eye Alignment: Within Functional Limits Ocular Range of Motion: Restricted on the left Alignment/Gaze Preference: Gaze right;Head turned Tracking/Visual Pursuits: Decreased smoothness of horizontal tracking;Decreased smoothness of vertical tracking;Decreased smoothness of eye movement to LEFT superior field;Decreased smoothness of eye movement to LEFT inferior field;Requires cues, head turns, or add eye shifts to track Perception Perception: Impaired Inattention/Neglect: Does not attend to left side of body Praxis Praxis: Intact  Cognition Overall Cognitive Status: Impaired/Different from baseline Arousal/Alertness: Awake/alert Orientation Level: Oriented X4 Year: 2023 Month: January Day of Week: Correct Memory: Impaired Memory Impairment: Retrieval deficit Immediate Memory Recall: Sock;Blue;Bed Memory Recall Sock:  Without Cue Memory Recall Blue: Without Cue Memory Recall Bed: With Cue Awareness: Appears intact Problem Solving: Impaired Problem Solving Impairment: Verbal complex;Functional complex Safety/Judgment: Impaired Comments: Decreased knowledge of current deficits and decreased knowledge of need for external assist. Sensation Sensation Light Touch: Appears Intact Hot/Cold: Appears Intact Proprioception: Appears Intact Stereognosis: Not tested Coordination Gross Motor Movements are Fluid and Coordinated: No Fine Motor Movements are Fluid and Coordinated: No Coordination and Movement Description: L hemi and global deconditioning/weakness post covid and hospitalization Finger Nose Finger Test: Slight deficits on R; uanble to assess on L 2/2 L hemi  Heel Shin Test: UTA due to weakness Motor  Motor Motor: Hemiplegia;Abnormal postural alignment and control Motor - Skilled Clinical Observations: L hemi   Trunk/Postural Assessment  Cervical Assessment Cervical Assessment: Within Functional Limits Thoracic Assessment Thoracic Assessment:  (rounded shoulders and mild kyphosis) Lumbar Assessment Lumbar Assessment: Exceptions to [facility] (posterior pelvic tilt in sitting) Postural Control Postural Control: Deficits on evaluation (delayed and insufficient)  Balance Balance Balance Assessed: Yes Standardized Balance Assessment Standardized Balance Assessment: Berg Balance Test Berg Balance Test Sit to Stand: Needs moderate or maximal assist to stand Standing Unsupported: Unable to stand 30 seconds unassisted Sitting with Back Unsupported but Feet Supported on Floor or Stool: Able to sit 10 seconds Stand to Sit: Needs assistance to sit Transfers: Needs one person to assist Standing Unsupported with Eyes Closed: Needs help to keep from falling Standing Ubsupported with Feet Together: Needs help to attain position and unable to hold for 15 seconds From Standing, Reach Forward with Outstretched  Arm: Loses balance while trying/requires external support From Standing Position, Pick up Object from Floor: Unable to try/needs assist to keep balance From Standing Position, Turn to Look Behind Over each Shoulder: Needs assist to keep from losing balance and falling Turn 360 Degrees: Needs assistance while turning Standing Unsupported, Alternately Place Feet on Step/Stool: Needs assistance to keep from falling or unable to try Standing Unsupported, One Foot in Front: Loses balance while stepping or standing Standing on One Leg: Unable to try or needs assist to prevent fall Total Score: 2 Static Sitting Balance Static Sitting - Balance Support: No upper extremity supported Static Sitting - Level of Assistance: 4: Min assist;5: Stand by assistance Dynamic Sitting Balance Dynamic Sitting - Balance Support: Right upper extremity supported Dynamic Sitting - Level of Assistance: 4: Min Insurance risk surveyor Standing - Balance Support: No upper extremity supported Static Standing - Level of Assistance: 3: Mod assist Dynamic Standing Balance Dynamic Standing - Balance Support: No upper extremity supported Dynamic Standing - Level of Assistance: 3: Mod assist;2: Max assist Extremity Assessment  RUE Assessment RUE Assessment: Exceptions to [facility] Passive Range of Motion (PROM) Comments: WFL Active Range of Motion (AROM) Comments: Limited ROM at shoulder (Hx recent shoulder surgery); WFL at elbow, wrist and digits General Strength Comments: MMT 4/5 grossly LUE Assessment LUE Assessment: Exceptions to [facility] Active Range of Motion (AROM) Comments: ~30 degrees active shoulder flexion/abduction; ~80 degrees active elbow flexion; trace movement at wrist/digits. General Strength Comments: MMT 2/5 - 2+/5 grossly LUE Body System: Neuro Brunstrum levels for arm and hand: Arm;Hand Brunstrum level for arm: Stage II Synergy is developing Brunstrum level for hand: Stage I Flaccidity RLE  Assessment RLE Assessment: Exceptions to [facility] General Strength Comments: Grossly 4-/5 LLE Assessment LLE Assessment: Exceptions to [facility] LLE Strength LLE Overall Strength: Deficits Left Hip Flexion: 2+/5 Left Hip ABduction: 2+/5 Left Knee Flexion: 2+/5 Left Knee Extension: 3+/5 Left Ankle Dorsiflexion: 3-/5  Care Tool Care Tool Bed Mobility Roll left and right activity Roll left and right activity did not occur: N/A Roll left and right assist level: Maximal Assistance - Patient 25 - 49%    Sit to lying activity Sit to lying activity did not occur: N/A Sit to lying assist level: Maximal Assistance - Patient 25 - 49%    Lying to sitting on side of bed activity Lying to sitting on side of bed activity did not occur: the ability to move from lying on the back to sitting on the side of the bed  with no back support.: N/A Lying to sitting on side of bed assist level: the ability to move from lying on the back to sitting on the side of the bed with no back support.: Maximal Assistance - Patient 25 - 49%     Care Tool Transfers Sit to stand transfer   Sit to stand assist level: Moderate Assistance - Patient 50 - 74%    Chair/bed transfer   Chair/bed transfer assist level: Maximal Assistance - Patient 25 - 49%     Toilet transfer   Assist Level: Moderate Assistance - Patient 50 - 74%    Car transfer Car transfer activity did not occur: Safety/medical concerns        Care Tool Locomotion Ambulation Ambulation activity did not occur: Safety/medical concerns        Walk 10 feet activity Walk 10 feet activity did not occur: Safety/medical concerns       Walk 50 feet with 2 turns activity Walk 50 feet with 2 turns activity did not occur: Safety/medical concerns      Walk 150 feet activity Walk 150 feet activity did not occur: Safety/medical concerns      Walk 10 feet on uneven surfaces activity Walk 10 feet on uneven surfaces activity did not occur: Safety/medical concerns       Stairs Stair activity did not occur: Safety/medical concerns        Walk up/down 1 step activity Walk up/down 1 step or curb (drop down) activity did not occur: Safety/medical concerns      Walk up/down 4 steps activity Walk up/down 4 steps activity did not occur: Safety/medical concerns      Walk up/down 12 steps activity Walk up/down 12 steps activity did not occur: Safety/medical concerns      Pick up small objects from floor Pick up small object from the floor (from standing position) activity did not occur: Safety/medical concerns      Wheelchair Is the patient using a wheelchair?: Yes Type of Wheelchair: Manual Wheelchair activity did not occur:  (fatigue at end of session) Wheelchair assist level: Dependent - Patient 0%    Wheel 50 feet with 2 turns activity   Assist Level: Dependent - Patient 0%  Wheel 150 feet activity   Assist Level: Dependent - Patient 0%    Refer to Care Plan for Long Term Goals  SHORT TERM GOAL WEEK 1 PT Short Term Goal 1 (Week 1): Pt will complete bed mobility with minA and no hospital bed features PT Short Term Goal 2 (Week 1): Pt will complete bed<>chair transfers with modA and LRAD PT Short Term Goal 3 (Week 1): Pt will ambulate 76f with modA and LRAD PT Short Term Goal 4 (Week 1): Pt will improve BERG score >7 points  Recommendations for other services: None   Skilled Therapeutic Intervention Mobility Bed Mobility Bed Mobility: Rolling Right;Rolling Left;Supine to Sit Rolling Right: Maximal Assistance - Patient 25-49% Rolling Left: Moderate Assistance - Patient 50-74% Supine to Sit: Moderate Assistance - Patient 50-74% Transfers Transfers: Sit to Stand;Stand to Sit;Squat Pivot Transfers Sit to Stand: Moderate Assistance - Patient 50-74% Stand to Sit: Moderate Assistance - Patient 50-74% Stand Pivot Transfers: Moderate Assistance - Patient 50 - 74% Stand Pivot Transfer Details: Visual cues/gestures for sequencing;Verbal cues for  sequencing;Tactile cues for sequencing;Verbal cues for gait pattern;Verbal cues for safe use of DME/AE;Manual facilitation for placement Stand Pivot Transfer Details (indicate cue type and reason): wc <> BSC; max multimodal cues for sequencing, hand palcement and  technique. Hand over hand assist to maintain position of LUE on RW. Squat Pivot Transfers: Maximal Assistance - Patient 25-49% Transfer (Assistive device): None Locomotion  Stairs / Additional Locomotion Stairs: No  Skilled Intervention: Pt presenting supine in bed at start of session. On 2L O2 via [place] saturating at 97% resting. Pt agreeable to PT evaluation. Pt pleasant and cooperative throughout evaluation, oriented x4. Retrieved 18x18 w/c and portable oxygen tank. Reviewed CIR policies, PT POC, PT goals, therapy scheduling, fall safety and use of chair alarms, etc. Pt verbalized understanding and in agreement. Initiated functional mobility as outlined above. Overall, requires modA for rolling L, maxA for rolling R and heavy modA for supine<>sit with HOB flat and no hospital bed features. He requires minA for static sitting while unsupported due to posterior bias. Donned pants with totalA and shirt with maxA for time. Completed sit<>stand with modA from EOB with no AD and required modA for static standing due to posterior lean. Completed squat<>pivot transfer with maxA from EOB to w/c using [place] over the back method to promote forward weight shift. Pt concluded session seated in w/c with chair alarm on and all needs in reach. Of note, he desaturated to 85% after bed mobility and required prolonged rest break and VC for pursed lip breathing with bumping O2 to 2.5L to increase oxygen to baseline.   Discharge Criteria: Patient will be discharged from PT if patient refuses treatment 3 consecutive times without medical reason, if treatment goals not met, if there is a change in medical status, if patient makes no progress towards goals or if  patient is discharged from hospital.  The above assessment, treatment plan, treatment alternatives and goals were discussed and mutually agreed upon: by patient  [name] PT, DPT 01/13/2021, 12:38 PM

## 2021-01-14 ENCOUNTER — Encounter (HOSPITAL_COMMUNITY): Payer: No Typology Code available for payment source | Admitting: Occupational Therapy

## 2021-01-14 LAB — GLUCOSE, CAPILLARY
Glucose-Capillary: 120 mg/dL — ABNORMAL HIGH (ref 70–99)
Glucose-Capillary: 131 mg/dL — ABNORMAL HIGH (ref 70–99)
Glucose-Capillary: 84 mg/dL (ref 70–99)
Glucose-Capillary: 131 mg/dL — ABNORMAL HIGH (ref 70–99)

## 2021-01-14 MED ORDER — LACTULOSE 10 GM/15ML PO SOLN
10.0000 g | ORAL | Status: DC
  Administered 2021-01-28: 10 g via ORAL
  Filled 2021-01-14 (×5): qty 15

## 2021-01-14 MED ORDER — ACETAMINOPHEN 325 MG PO TABS
650.0000 mg | ORAL_TABLET | Freq: Four times a day (QID) | ORAL | Status: DC | PRN
  Administered 2021-01-14 – 2021-01-17 (×3): 650 mg via ORAL
  Filled 2021-01-14 (×3): qty 2

## 2021-01-14 NOTE — Progress Notes (Signed)
Physical Therapy Session Note  Patient Details  Name: Dalton Miller MRN: [id number] Date of Birth: [birth year]  Today's Date: 01/14/2021 PT Individual Time: 0915-1000 + 1330-1430 PT Individual Time Calculation (min): 45 min  + 60 min  Short Term Goals: Week 1:  PT Short Term Goal 1 (Week 1): Pt will complete bed mobility with minA and no hospital bed features PT Short Term Goal 2 (Week 1): Pt will complete bed<>chair transfers with modA and LRAD PT Short Term Goal 3 (Week 1): Pt will ambulate 10ft with modA and LRAD PT Short Term Goal 4 (Week 1): Pt will improve BERG score >7 points  Skilled Therapeutic Interventions/Progress Updates:      1st session: Pt supine in bed to start session. On 1.5L wall oxygen at 97% resting. Remained on 2L portable tank during treatment, desaturating to 92% with short distance gait in // bars but recovering quickly to baseline with seated rest. Pt noted to be incontinent of bladder and small smear of BM. Provided pericare and brief change at bed level, dependant for time. Rolling in bed towards his L side with modA and maxA towards his R. Supine<>sit with modA for BLE and trunk support. Requires CGA during initial sitting but fades to minA with fatigue with posterior lean. Donned pants with totalA for time - required assist for pulling up in standing. Stand<>pivot transfer completed with modA to w/c and then transported downstairs to [facility] rehab gym for time and energy conservation. Wheeled in // bars. Completed sit<>stand in bars with minA and gait training ~76ft + ~3ft with minA with +2 for w/c follow for safety. Gait is slowed with decreased initiation on L side. Required hand-over-hand assist for guiding LUE along rail and TC for facilitating LLE swing. Pt without knee buckling but weakness noted on LLE. Pt returned upstairs to his room in w/c and concluded session seated with belt alarm on, all needs in reach. Throughout session, encouraged L visual scanning and  attending to targets on L/front to reduce his strong R gaze preference.   2nd session: Pt seen sitting in w/c, on 2L wall oxygen 97% resting. Remains >95% during session on 2L. Pt reporting 8/10 L wrist pain - noted some pitting edema in the wrist that appears worse than yesterday. Notified RN and ice pack provided and elevation/repositoining at end of session. Pt transported downstairs to [facility] rehab gym in w/c for time and energy conservation. Completed stand<>pivot transfer with modA from w/c to mat table. Worked on pre-gait and standing balance tasks. Provided RW with RW splint for paretic LUE. Sit<>stand to RW with modA and he required modA for initial standing to due to posterior lean. Used large mirror for visual feedback to improve balance and to reduce R gaze preference. In standing, able to progress pre-gait tasks with forward/backward stepping with modA and forward weight shifting with modA. Pt struggles with correcting posterior bias despite multi-modal cues. Able to progress gait this session, ambulating 67ft with modA and RW with +2 assist for w/c follow for safety. Primary gait deficits include step-to gait pattern, L and forward trunk lean, decreased gait speed, decreased L hip/knee flexion in swing. No overt LOB or knee buckling present. Pt returned upstairs to his room in w/c and remained seated with safety belt alarm on, all needs within reach. Retrieved ROHO cushion and switched this for standard foam cushion as pt incontinent as well as has pressure sore on sacrum.    Therapy Documentation Precautions:  Precautions Precautions: Fall  Precaution Comments: L inattention; L hemi; post covid; recent R shoulder arthroscopy and debridement (12/17/20) Restrictions Weight Bearing Restrictions: No General:    Therapy/Group: Individual Therapy  [name] 01/14/2021, 7:47 AM

## 2021-01-14 NOTE — Progress Notes (Signed)
Speech Language Pathology Daily Session Note  Patient Details  Name: Dalton Miller MRN: [id number] Date of Birth: [birth year]  Today's Date: 01/14/2021 SLP Individual Time: 1100-1200 SLP Individual Time Calculation (min): 60 min  Short Term Goals: Week 1: SLP Short Term Goal 1 (Week 1): Pt will demonstrate alertness and sustained attention during functional task for 5 minutes with mod A verbal cues in 75% of opportunties. SLP Short Term Goal 2 (Week 1): Pt will demonstrate basic to mildly complex problem solving skills in fucntional tasks with mod A verbal cues. SLP Short Term Goal 3 (Week 1): Pt will demonstrate self monitoring of functional errors in problem solving tasks with mod A multimodal cues. SLP Short Term Goal 4 (Week 1): Pt will demonstrate recall of daily and novel information with mod A verbal cues to utilize compensatory aids. SLP Short Term Goal 5 (Week 1): Pt will scan left of midline in functional tasks to locate objects with 60% accuracy given max A multimodal cues.  Skilled Therapeutic Interventions:   Patient seen for skilled ST session focused on cognitive function goals. Patient sitting up in [facility] watching TV when SLP arrived. During task of matching shapes on playing cards, he required initially maxA verbal, visual cues to scan to left side of visual field but this improved to modA as session progressed. Patient easily distracted by TV on in room and attention improved when turned off. He demonstrated recall of people telling him to look to left, but functionally did not demonstrate awareness. Initially he was fairly flat in affect but during informal conversation about his work history, etc he was much more animated. He was left in [facility] with all needs within reach. He continues to benefit from skilled SLP intervention to maximize cognitive function prior to discharge.   Pain Pain Assessment Pain Scale: 0-10 Pain Score: 0-No pain Pain Type: Acute pain Pain Location:  Wrist Pain Orientation: Left Pain Intervention(s): Medication (See eMAR);Pain med given for lower pain score than stated, per patient request;Cold applied;Elevated extremity;Emotional support;Relaxation;Rest  Therapy/Group: Individual Therapy  Sonia Baller, MA, CCC-SLP Speech Therapy

## 2021-01-14 NOTE — Progress Notes (Signed)
PROGRESS NOTE   Subjective/Complaints: No new complaints today About to work with PT Discussed importance of using incentive spirometer He is bothered by diarrhea- will change lactulose to every other day.   ROS: +diarrhea  Objective:   No results found. Recent Labs    01/13/21 0544  WBC 11.8*  HGB 9.9*  HCT 31.4*  PLT 143*   Recent Labs    01/13/21 0544  NA 145  K 4.2  CL 115*  CO2 22  GLUCOSE 126*  BUN 20  CREATININE 1.38*  CALCIUM 7.7*    Intake/Output Summary (Last 24 hours) at 01/14/2021 1155 Last data filed at 01/13/2021 2200 Gross per 24 hour  Intake 220 ml  Output --  Net 220 ml        Physical Exam: Vital Signs Blood pressure 140/71, pulse 85, temperature 98.3 F (36.8 C), temperature source Oral, resp. rate 18, height 5\' 7"  (1.702 m), weight 83.4 kg, SpO2 97 %. Gen: no distress, normal appearing HEENT: oral mucosa pink and moist, NCAT, [place] in place Cardio: Reg rate Chest: normal effort, normal rate of breathing Abd: soft, non-distended Ext: no edema Psych: pleasant, normal affect Skin: intact Neurological:     Mental Status: He is alert.     Comments: Patient is alert.  Makes eye contact with examiner.  Provides name and age but limited medical historian. Normal language. Speech sl dysarthric. Follows simple commands. Left central 7 and tongue deviation. Right gaze preference.  LUE 2 to 2+/5. LLE 3 to 3+/5. RUE and RLE 4 to 4+/5. No obvious sensory deficits. No limb ataxia apparent. Normal resting tone and DTR's 1+.     Assessment/Plan: 1. Functional deficits which require 3+ hours per day of interdisciplinary therapy in a comprehensive inpatient rehab setting. Physiatrist is providing close team supervision and 24 hour management of active medical problems listed below. Physiatrist and rehab team continue to assess barriers to discharge/monitor patient progress toward functional and  medical goals  Care Tool:  Bathing  Bathing activity did not occur: Refused           Bathing assist       Upper Body Dressing/Undressing Upper body dressing Upper body dressing/undressing activity did not occur (including orthotics): Refused What is the patient wearing?: Hospital gown only    Upper body assist Assist Level: Moderate Assistance - Patient 50 - 74%    Lower Body Dressing/Undressing Lower body dressing    Lower body dressing activity did not occur: Refused What is the patient wearing?: Incontinence brief     Lower body assist Assist for lower body dressing: Moderate Assistance - Patient 50 - 74%     Toileting Toileting    Toileting assist Assist for toileting: Moderate Assistance - Patient 50 - 74%     Transfers Chair/bed transfer  Transfers assist  Chair/bed transfer activity did not occur: Safety/medical concerns  Chair/bed transfer assist level: Maximal Assistance - Patient 25 - 49%     Locomotion Ambulation   Ambulation assist   Ambulation activity did not occur: Safety/medical concerns          Walk 10 feet activity   Assist  Walk 10 feet activity  did not occur: Safety/medical concerns        Walk 50 feet activity   Assist Walk 50 feet with 2 turns activity did not occur: Safety/medical concerns         Walk 150 feet activity   Assist Walk 150 feet activity did not occur: Safety/medical concerns         Walk 10 feet on uneven surface  activity   Assist Walk 10 feet on uneven surfaces activity did not occur: Safety/medical concerns         Wheelchair     Assist Is the patient using a wheelchair?: Yes Type of Wheelchair: Manual Wheelchair activity did not occur:  (fatigue at end of session)  Wheelchair assist level: Dependent - Patient 0%      Wheelchair 50 feet with 2 turns activity    Assist        Assist Level: Dependent - Patient 0%   Wheelchair 150 feet activity      Assist      Assist Level: Dependent - Patient 0%   Blood pressure 140/71, pulse 85, temperature 98.3 F (36.8 C), temperature source Oral, resp. rate 18, height 5\' 7"  (1.702 m), weight 83.4 kg, SpO2 97 %.    Medical Problem List and Plan: 1. Functional deficits  disconjugate gaze with altered mental status/acute hepatic metabolic encephalopathy/multifactorial secondary to right MCA scattered infarcts             -patient may shower             -ELOS/Goals: 14-17 days, min assist goals with PT, OT, SLP  Continue CIR  Messaged Lisa to schedule HFU 2.  Impaired mobility -DVT/anticoagulation:  Mechanical: Antiembolism stockings, thigh (TED hose) Bilateral lower extremities             -antiplatelet therapy: continue Aspirin 81 mg daily 3. Pain Management: Tylenol held due to elevated LFTs 4. Mood: Provide emotional support             -antipsychotic agents: N/A 5. Neuropsych: This patient is capable of making decisions on his own behalf. 6. Skin/Wound Care: Routine skin checks 7. Fluids/Electrolytes/Nutrition: Routine in and outs with follow-up chemistries 8.  Acute blood loss anemia due to upper GI bleed esophageal varices.  Status post banding x4 12/30/2020.   -Continue Protonix twice daily.   -serial hgb's -Follow-up per GI services 9.  Acute hypoxic respiratory failure.  Extubated 01/06/2020 10.  Metastatic recurrent melanoma/left upper lobe lesion, mediastinal adenopathy.  Status post 4 cycles ipo.nivo.  Follow-up outpatient             -currently on oxygen. Wean to off as able 10.  COVID-19 infection.  Appeared incidental.  Isolation completed. 11.  Recurrent low-grade urothelial carcinoma.  Status post TURBT x3.  Follow-up outpatient.   12.  Elevated LFTs/hepatic cirrhosis.  Markedly improved.  Continue lactulose.  Holding Lasix/Spiro due to AKI 13.  AKI.  Status post solitary kidney status post nephrectomy due to [place]. Encouraged 6-8 glasses of water per day.  14.   Diabetes mellitus.  Hemoglobin A1c 7.6.  SSI.             -borderline to fair control at present. Provided dietary education. D/c feeding supplements 15.  Hypertension.  Norvasc 10 mg daily.  Monitor with increased mobility 16.  Hyperlipidemia.  Lipitor resumed 10 mg daily 17. Obesity BMI 30.25-->28.80: provided dietary education. 18. Diarrhea: decrease lactulose to every other day     LOS: 2 days  A FACE TO FACE EVALUATION WAS PERFORMED  Izora Ribas 01/14/2021, 11:55 AM

## 2021-01-14 NOTE — Progress Notes (Signed)
Occupational Therapy Session Note  Patient Details  Name: Dalton Miller MRN: [id number] Date of Birth: [birth year]  Today's Date: 01/14/2021 OT Individual Time: 1006-1046 OT Individual Time Calculation (min): 40 min   Short Term Goals: Week 1:  OT Short Term Goal 1 (Week 1): Patient will 1/3 parts of toileting task with Min A. OT Short Term Goal 2 (Week 1): Patient will complete stand-pivot to [facility] with Min A and LRAD. OT Short Term Goal 3 (Week 1): Patient will don LB clothing with Min A and good return demo of hemi technique. OT Short Term Goal 4 (Week 1): Patient will don UB clothing with Min A and good return demo of hemi technique.  Skilled Therapeutic Interventions/Progress Updates:    Pt greeted seated in wc and agreeable to OT treatment session focused on self-care retraining. Pt maintained on 2L of O2 throughout session. UB bathing/dressing completed from wc with hand over hand A to integrate L UE. Pt reported soreness around elbow, increased when brought into extension. Addressed L attention within BADL tasks requiring multimodal cues to locate items on L side of sink. Mod A for UB ADLs. LB BADLs completed with Max A overall, mod A sit<>stands with verbal cues for technique. Pt with poor initiation and internally distracted when trying to brush dentures and perform oral care. Pt needed max multimodal cues to initiate taking dentures out, then OT assist to clean and apply fixodent. Pt left seated in wc at end of session with alarm belt on, call bell in reach, and L UE supported on pillow.   Therapy Documentation Precautions:  Precautions Precautions: Fall Precaution Comments: L inattention; L hemi; post covid; recent R shoulder arthroscopy and debridement (12/17/20) Restrictions Weight Bearing Restrictions: No Pain: Pain Assessment Pain Scale: No number given, pain in L elbow, repositioned for comfort   Therapy/Group: Individual Therapy  Valma Cava 01/14/2021, 10:54 AM

## 2021-01-15 LAB — GLUCOSE, CAPILLARY
Glucose-Capillary: 108 mg/dL — ABNORMAL HIGH (ref 70–99)
Glucose-Capillary: 155 mg/dL — ABNORMAL HIGH (ref 70–99)
Glucose-Capillary: 161 mg/dL — ABNORMAL HIGH (ref 70–99)
Glucose-Capillary: 93 mg/dL (ref 70–99)

## 2021-01-15 NOTE — Progress Notes (Signed)
Occupational Therapy Session Note  Patient Details  Name: Dalton Miller MRN: [id number] Date of Birth: Jun 02, 1945  Today's Date: 01/15/2021 OT Individual Time: 1300-1330 OT Individual Time Calculation (min): 30 min    Short Term Goals: Week 1:  OT Short Term Goal 1 (Week 1): Patient will 1/3 parts of toileting task with Min A. OT Short Term Goal 2 (Week 1): Patient will complete stand-pivot to [facility] with Min A and LRAD. OT Short Term Goal 3 (Week 1): Patient will don LB clothing with Min A and good return demo of hemi technique. OT Short Term Goal 4 (Week 1): Patient will don UB clothing with Min A and good return demo of hemi technique.  Skilled Therapeutic Interventions/Progress Updates:  Pt greeted seated in w/c  agreeable to OT intervention with pt finishing up lunch. Pt noted to have what appeared to be a choking episode as pt became red and unresponsive, with pt eventually able to cough up liquid ( ~ 20 secs) with back blows provided. Pt reports these episodes have been happening even before his stroke. Alerted RN,NT and SLP, asked pt to wait for SLP assessment before having anymore think liquids. Also set up suction in pts room and provided education and demo on how to use. Provided pt with compliant cube for L hand to work on functional grasp and release as well as pressing cube into table working on shoulder activation. Pt demonstrates trace movement. Pt left seated in w/c with alarm belt activated, suction and call bell within reach.                       Therapy Documentation Precautions:  Precautions Precautions: Fall Precaution Comments: L inattention; L hemi; post covid; recent R shoulder arthroscopy and debridement (12/17/20) Restrictions Weight Bearing Restrictions: No  Pain: no pain reported during session     Therapy/Group: Individual Therapy  Corinne Ports [facility] 01/15/2021, 2:01 PM

## 2021-01-15 NOTE — Progress Notes (Signed)
Physical Therapy Session Note  Patient Details  Name: Dalton Miller MRN: [id number] Date of Birth: August 09, 1945  Today's Date: 01/15/2021 PT Individual Time: 1000-1100 PT Individual Time Calculation (min): 60 min   Short Term Goals: Week 1:  PT Short Term Goal 1 (Week 1): Pt will complete bed mobility with minA and no hospital bed features PT Short Term Goal 2 (Week 1): Pt will complete bed<>chair transfers with modA and LRAD PT Short Term Goal 3 (Week 1): Pt will ambulate 13ft with modA and LRAD PT Short Term Goal 4 (Week 1): Pt will improve BERG score >7 points  Skilled Therapeutic Interventions/Progress Updates:      Pt sitting in w/c at start of session, agreeable to PT tx. Reports some improvement in R wrist pain but still some pitting edema on wrist. Provided him with 1/2 lap tray during session to assist with LUE support and elevation which he was pleased with. Pt requesting to finish up some of his breakfast prior to leaving to rehab gym. Noted some difficulty with cutting his breakfast quesidilla, attending to breakfast tray on his L side, and sustaining focused attention to eat his breakfast. Pt also trying to use his fork to stab the small stress ball, believing it was a tomato. Notified SLP of concerns with possible recommendation for supervision for meals to improve nutrition and diet. Pt transported in w/c downstairs to [facility] rehab gym and connected to 2L portable O2 during session, >90% during session. Assisted to mat table via stand<>pivot transfer with no AD and modA for balance and weight shifting to facilitate stepping during transfer - continues to show moderate posterior lean in standing. Remainder of session focused on standing tolerance, sit<>stand transfers, L visual scanning, and reaching outside BOS - tasked to match playing cards on back of mirror in standing with minA for balance with no UE support. Cards placed on L side to promote scanning and forced reaching with RUE across  midline outside BOS. Pt completed ~5-8 cards with some prolonged seated rest breaks b/w efforts due to BLE fatigue > endurance. Pt required mod to max cues for scanning L and mod cues for identifying correct card to match. Pt able to stand for ~2-3 minute intervals each time prior to fatigue. Pt assisted back to chair in similar manner as above and returned upstairs to his room. Reconnected to wall O2 at 2L, safety belt alarm on, all immediate needs within reach.   Therapy Documentation Precautions:  Precautions Precautions: Fall Precaution Comments: L inattention; L hemi; post covid; recent R shoulder arthroscopy and debridement (12/17/20) Restrictions Weight Bearing Restrictions: No General:    Therapy/Group: Individual Therapy  [name] 01/15/2021, 7:38 AM

## 2021-01-15 NOTE — Evaluation (Signed)
Speech Language Pathology  Bedside/Clinical Swallow Evaluation Patient Details  Name: Dalton Miller MRN: [id number] Date of Birth: 03-Oct-1945   Today's Date: 01/15/2021 SLP Individual Time: 41-1430 SLP Individual Time Calculation (min): 59 min   Hospital Problem: Principal Problem:   Right middle cerebral artery stroke [facility])  Past Medical History:  Past Medical History:  Diagnosis Date   Acquired renal cyst of right kidney    Arthritis    Cancer ([place])    renal cell    Erectile dysfunction    Heart murmur    CHILDHOOD   Hematuria    left ureteral bleeding   History of cellulitis    2012 left wrist   History of kidney stones    Hyperlipidemia    Hypertension    Metastatic melanoma to lung, left ([place])    Nephrolithiasis    bilateral non-obstructive per ct 07-02-2015   Type 2 diabetes mellitus ([place])    Wears glasses    Past Surgical History:  Past Surgical History:  Procedure Laterality Date   CARPAL TUNNEL RELEASE Right 1990  approx   COLONOSCOPY  03/04/2010   CYSTOSCOPY W/ RETROGRADES Left 10/28/2015   Procedure: CYSTOSCOPY WITH LEFT RETROGRADE PYELOGRAM;  Surgeon: Cleon Gustin, MD;  Location: [facility];  Service: Urology;  Laterality: Left;   CYSTOSCOPY W/ URETERAL STENT PLACEMENT Left 10/28/2015   Procedure: CYSTOSCOPY WITH LEFT URETERAL STENT EXCHANGE;  Surgeon: Cleon Gustin, MD;  Location: [facility];  Service: Urology;  Laterality: Left;   CYSTOSCOPY WITH RETROGRADE PYELOGRAM, URETEROSCOPY AND STENT PLACEMENT Bilateral 09/17/2015   Procedure: CYSTOSCOPY WITH BILATERAL RETROGRADE PYELOGRAM, LEFT URETEROSCOPY WITH URETERAL BIOPSY AND  STENT PLACEMENT;  Surgeon: Irine Seal, MD;  Location[facility];  Service: Urology;  Laterality: Bilateral;   ESOPHAGEAL BANDING  12/30/2020   Procedure: ESOPHAGEAL BANDING;  Surgeon: Ladene Artist, MD;  Location: [facility] ENDOSCOPY;  Service: Endoscopy;;   ESOPHAGOGASTRODUODENOSCOPY (EGD) WITH PROPOFOL N/A 12/30/2020    Procedure: ESOPHAGOGASTRODUODENOSCOPY (EGD) WITH PROPOFOL;  Surgeon: Ladene Artist, MD;  Location: [facility] ENDOSCOPY;  Service: Endoscopy;  Laterality: N/A;   HOLMIUM LASER APPLICATION Left 32/35/5732   Procedure: HOLMIUM LASER APPLICATION;  Surgeon: Irine Seal, MD;  Location: [facility];  Service: Urology;  Laterality: Left;   KNEE ARTHROSCOPY Right 2008   MELANOMA EXCISION  2018   Left arm   right total knee replacement  Right    2009   ROBOT ASSITED LAPAROSCOPIC NEPHROURETERECTOMY Left 12/04/2015   Procedure: XI ROBOT ASSITED LAPAROSCOPIC NEPHROURETERECTOMY;  Surgeon: Cleon Gustin, MD;  Location: [facility];  Service: Urology;  Laterality: Left;   SHOULDER ARTHROSCOPY Right 12/17/2020   Procedure: Right shoulder arthroscopy, debridement, subacromial decompression, distal clavicle resection;  Surgeon: Justice Britain, MD;  Location: [facility];  Service: Orthopedics;  Laterality: Right;  59mn   STONE EXTRACTION WITH BASKET Left 09/17/2015   Procedure: STONE EXTRACTION WITH BASKET;  Surgeon: JIrine Seal MD;  [facility]  Service: Urology;  Laterality: Left;   TOTAL KNEE ARTHROPLASTY Right 2009   URETEROSCOPY  10/28/2015   Procedure: DIAGNOSTIC LEFT URETEROSCOPY;  Surgeon: PCleon Gustin MD;  Location: [facility];  Service: Urology;;   WISDOM TOOTH EXTRACTION      Assessment / Plan / Recommendation Clinical Impression Patient presents with clinicial s/s of dysphagia as per this bedside swallow evaluation. When consuming thin liquids via straw and cup, he exhbited immediate and delayed cough response approximately 40% of the time. When drinking nectar  thick liquids via straw sips, he did not exhibit any cough response. Patient reported that he has had coughing with liquids, especially when drinking liquids to wash down solids and that this has been going on for approximately one year. When he was drinking nectar thick liquids, he did report that this  consistency felt better when swallowing. His daughter arrived in room during session and SLP educated her about plan for short term modification of his liquids to nectar thick consistency and possibility of objective swallow assessment. Patient and daughter both in agreement with this plan.                  Pain Pain Assessment Pain Scale: 0-10 Pain Score: 0-No pain    Bedside Swallowing Assessment General Date of Onset: 01/15/21 Previous Swallow Assessment: none found Diet Prior to this Study: Regular;Thin liquids Temperature Spikes Noted: No Respiratory Status: Supplemental O2 delivered via (comment) (nasal cannula) History of Recent Intubation: No Behavior/Cognition: Alert;Cooperative;Pleasant mood Oral Cavity - Dentition: Adequate natural dentition Self-Feeding Abilities: Able to feed self Patient Positioning: Upright in chair/Tumbleform Baseline Vocal Quality: Normal Volitional Cough: Strong Volitional Swallow: Able to elicit  Oral Care Assessment   Ice Chips   Thin Liquid Thin Liquid: Impaired Presentation: Straw;Cup Pharyngeal  Phase Impairments: Cough - Immediate;Cough - Delayed Nectar Thick Nectar Thick Liquid: Within functional limits Presentation: Cup;Straw Honey Thick   Puree Puree: Not tested Solid Solid: Not tested BSE Assessment Risk for Aspiration Impact on safety and function: Mild aspiration risk Other Related Risk Factors: Other (comment);Cognitive impairment (patient reports approximately year long history of coughing occasionally with liquids)  Short Term Goals: Week 1: SLP Short Term Goal 1 (Week 1): Pt will demonstrate alertness and sustained attention during functional task for 5 minutes with mod A verbal cues in 75% of opportunties. SLP Short Term Goal 2 (Week 1): Pt will demonstrate basic to mildly complex problem solving skills in fucntional tasks with mod A verbal cues. SLP Short Term Goal 3 (Week 1): Pt will demonstrate self monitoring  of functional errors in problem solving tasks with mod A multimodal cues. SLP Short Term Goal 4 (Week 1): Pt will demonstrate recall of daily and novel information with mod A verbal cues to utilize compensatory aids. SLP Short Term Goal 5 (Week 1): Pt will scan left of midline in functional tasks to locate objects with 60% accuracy given max A multimodal cues. SLP Short Term Goal 6 (Week 1): Patient will tolerate regular solids, nectar thick liquids without overt s/s aspiration or penetration.  Refer to Care Plan for Long Term Goals  Recommendations for other services: None   Discharge Criteria: Patient will be discharged from SLP if patient refuses treatment 3 consecutive times without medical reason, if treatment goals not met, if there is a change in medical status, if patient makes no progress towards goals or if patient is discharged from hospital.  The above assessment, treatment plan, treatment alternatives and goals were discussed and mutually agreed upon: by patient and by family  Sonia Baller, MA, CCC-SLP Speech Therapy

## 2021-01-15 NOTE — Progress Notes (Signed)
PROGRESS NOTE   Subjective/Complaints: No new complaints today Wrist pain improved- advised he can ice is hurting again or use tylenol if this doesn't help Eating lunch  ROS: +diarrhea, denies shortness of breath  Objective:   No results found. Recent Labs    01/13/21 0544  WBC 11.8*  HGB 9.9*  HCT 31.4*  PLT 143*   Recent Labs    01/13/21 0544  NA 145  K 4.2  CL 115*  CO2 22  GLUCOSE 126*  BUN 20  CREATININE 1.38*  CALCIUM 7.7*    Intake/Output Summary (Last 24 hours) at 01/15/2021 1226 Last data filed at 01/14/2021 2134 Gross per 24 hour  Intake 535 ml  Output --  Net 535 ml        Physical Exam: Vital Signs Blood pressure 131/82, pulse 89, temperature 98.9 F (37.2 C), resp. rate 20, height 5\' 7"  (1.702 m), weight 84.5 kg, SpO2 98 %. Gen: no distress, normal appearing HEENT: oral mucosa pink and moist, NCAT, [place] in place Cardio: Reg rate Chest: normal effort, normal rate of breathing, sp02 >94% with activity Abd: soft, non-distended Ext: no edema Psych: pleasant, normal affect Skin: intact Neurological:     Mental Status: He is alert.     Comments: Patient is alert.  Makes eye contact with examiner.  Provides name and age but limited medical historian. Normal language. Speech sl dysarthric. Follows simple commands. Left central 7 and tongue deviation. Right gaze preference.  LUE 2 to 2+/5. LLE 3 to 3+/5. RUE and RLE 4 to 4+/5. No obvious sensory deficits. No limb ataxia apparent. Normal resting tone and DTR's 1+.     Assessment/Plan: 1. Functional deficits which require 3+ hours per day of interdisciplinary therapy in a comprehensive inpatient rehab setting. Physiatrist is providing close team supervision and 24 hour management of active medical problems listed below. Physiatrist and rehab team continue to assess barriers to discharge/monitor patient progress toward functional and medical  goals  Care Tool:  Bathing  Bathing activity did not occur: Refused Body parts bathed by patient: Left arm, Chest, Abdomen, Face   Body parts bathed by helper: Right arm, Left lower leg, Right lower leg, Left upper leg, Right upper leg, Buttocks, Front perineal area     Bathing assist Assist Level: Maximal Assistance - Patient 24 - 49%     Upper Body Dressing/Undressing Upper body dressing Upper body dressing/undressing activity did not occur (including orthotics): Refused What is the patient wearing?: Pull over shirt    Upper body assist Assist Level: Moderate Assistance - Patient 50 - 74%    Lower Body Dressing/Undressing Lower body dressing    Lower body dressing activity did not occur: Refused What is the patient wearing?: Pants     Lower body assist Assist for lower body dressing: Maximal Assistance - Patient 25 - 49%     Toileting Toileting    Toileting assist Assist for toileting: Maximal Assistance - Patient 25 - 49%     Transfers Chair/bed transfer  Transfers assist  Chair/bed transfer activity did not occur: Safety/medical concerns  Chair/bed transfer assist level: Minimal Assistance - Patient > 75%     Locomotion Ambulation  Ambulation assist   Ambulation activity did not occur: Safety/medical concerns          Walk 10 feet activity   Assist  Walk 10 feet activity did not occur: Safety/medical concerns        Walk 50 feet activity   Assist Walk 50 feet with 2 turns activity did not occur: Safety/medical concerns         Walk 150 feet activity   Assist Walk 150 feet activity did not occur: Safety/medical concerns         Walk 10 feet on uneven surface  activity   Assist Walk 10 feet on uneven surfaces activity did not occur: Safety/medical concerns         Wheelchair     Assist Is the patient using a wheelchair?: Yes Type of Wheelchair: Manual Wheelchair activity did not occur:  (fatigue at end of  session)  Wheelchair assist level: Dependent - Patient 0%      Wheelchair 50 feet with 2 turns activity    Assist        Assist Level: Dependent - Patient 0%   Wheelchair 150 feet activity     Assist      Assist Level: Dependent - Patient 0%   Blood pressure 131/82, pulse 89, temperature 98.9 F (37.2 C), resp. rate 20, height 5\' 7"  (1.702 m), weight 84.5 kg, SpO2 98 %.    Medical Problem List and Plan: 1. Functional deficits  disconjugate gaze with altered mental status/acute hepatic metabolic encephalopathy/multifactorial secondary to right MCA scattered infarcts             -patient may shower             -ELOS/Goals: 14-17 days, min assist goals with PT, OT, SLP  Continue CIR  Messaged April to schedule HFU 2.  Impaired mobility -DVT/anticoagulation:  Mechanical: Antiembolism stockings, thigh (TED hose) Bilateral lower extremities             -antiplatelet therapy: continue Aspirin 81 mg daily 3. Pain Management: Tylenol held due to elevated LFTs 4. Mood: Provide emotional support             -antipsychotic agents: N/A 5. Neuropsych: This patient is capable of making decisions on his own behalf. 6. Skin/Wound Care: Routine skin checks 7. Fluids/Electrolytes/Nutrition: Routine in and outs with follow-up chemistries 8.  Acute blood loss anemia due to upper GI bleed esophageal varices.  Status post banding x4 12/30/2020.   -Continue Protonix twice daily.   -serial hgb's -Follow-up per GI services 9.  Acute hypoxic respiratory failure.  Extubated 01/06/2020. Discussed importance of using incentive spirometer.  10.  Metastatic recurrent melanoma/left upper lobe lesion, mediastinal adenopathy.  Status post 4 cycles ipo.nivo.  Follow-up outpatient             -currently on oxygen. Wean to off as able 10.  COVID-19 infection.  Appeared incidental.  Isolation completed. 11.  Recurrent low-grade urothelial carcinoma.  Status post TURBT x3.  Follow-up outpatient.   12.   Elevated LFTs/hepatic cirrhosis.  Markedly improved.  Continue lactulose.  Holding Lasix/Spiro due to AKI 13.  AKI.  Status post solitary kidney status post nephrectomy due to [place]. Encouraged 6-8 glasses of water per day.  14.  Diabetes mellitus.  Hemoglobin A1c 7.6.  SSI.             -borderline to fair control at present. Provided dietary education. D/c feeding supplements 15.  Hypertension.  Well controlled, continue Norvasc  10 mg daily.  Monitor with increased mobility 16.  Hyperlipidemia.  Lipitor resumed 10 mg daily 17. Obesity BMI 30.25-->28.80: provided dietary education. 18. Diarrhea: decrease lactulose to every other day     LOS: 3 days A FACE TO FACE EVALUATION WAS PERFORMED  Clide Deutscher Hailei Besser 01/15/2021, 12:26 PM

## 2021-01-15 NOTE — IPOC Note (Signed)
Overall Plan of Care [facility]) Patient Details Name: Dalton Miller MRN: [id number] DOB: 01-28-1945  Admitting Diagnosis: Right middle cerebral artery stroke [facility])  Hospital Problems: Principal Problem:   Right middle cerebral artery stroke [facility])     Functional Problem List: Nursing Bladder, Medication Management, Safety, Pain, Endurance, Bowel, Skin Integrity  PT Balance, Endurance, Motor, Safety, Skin Integrity  OT Balance, Cognition, Endurance, Motor, Skin Integrity, Vision  SLP Cognition  TR         Basic ADLs: OT Grooming, Bathing, Dressing, Toileting     Advanced  ADLs: OT       Transfers: PT Bed Mobility, Bed to Chair, Car, Manufacturing systems engineer, Metallurgist: PT Ambulation, Emergency planning/management officer, Stairs     Additional Impairments: OT Fuctional Use of Upper Extremity  SLP Social Cognition   Problem Solving, Memory, Attention, Awareness  TR      Anticipated Outcomes Item Anticipated Outcome  Self Feeding N/A  Swallowing      Basic self-care  CGA-Min A  Toileting  CGA-Min A   Bathroom Transfers CGA  Bowel/Bladder  Manage bowel with mod I and bladder with min assist  Transfers  minA with LRAD  Locomotion  minA with LRAD  Communication     Cognition  Min A  Pain  pain at or below level 4 with prn meds  Safety/Judgment  maintain safety wtih cues/reminders   Therapy Plan: PT Intensity: Minimum of 1-2 x/day ,45 to 90 minutes PT Frequency: 5 out of 7 days, Total of 15 hours over 7 days of combined therapies PT Duration Estimated Length of Stay: 2.5 weeks OT Intensity: Minimum of 1-2 x/day, 45 to 90 minutes OT Frequency: 5 out of 7 days OT Duration/Estimated Length of Stay: 2-2.5 weeks SLP Intensity: Minumum of 1-2 x/day, 30 to 90 minutes SLP Frequency: 3 to 5 out of 7 days SLP Duration/Estimated Length of Stay: 2.5 weeks   Due to the current state of emergency, patients may not be receiving their 3-hours of Medicare-mandated  therapy.   Team Interventions: Nursing Interventions Patient/Family Education, Bowel Management, Pain Management, Skin Care/Wound Management, Medication Management, Disease Management/Prevention, Bladder Management, Discharge Planning  PT interventions Ambulation/gait training, Balance/vestibular training, Cognitive remediation/compensation, Community reintegration, Discharge planning, Disease management/prevention, DME/adaptive equipment instruction, Functional mobility training, Neuromuscular re-education, Functional electrical stimulation, Pain management, Patient/family education, Skin care/wound management, Psychosocial support, Splinting/orthotics, Stair training, Therapeutic Activities, Therapeutic Exercise, UE/LE Strength taining/ROM, UE/LE Coordination activities, Visual/perceptual remediation/compensation, Wheelchair propulsion/positioning  OT Interventions Training and development officer, Cognitive remediation/compensation, Community reintegration, Discharge planning, DME/adaptive equipment instruction, Functional electrical stimulation, Functional mobility training, Neuromuscular re-education, Patient/family education, Psychosocial support, Self Care/advanced ADL retraining, Skin care/wound managment, Therapeutic Activities, Therapeutic Exercise, UE/LE Strength taining/ROM, UE/LE Coordination activities, Visual/perceptual remediation/compensation  SLP Interventions Cognitive remediation/compensation, Cueing hierarchy, Medication managment, Patient/family education, Internal/external aids, Functional tasks  TR Interventions    SW/CM Interventions Discharge Planning, Psychosocial Support, Patient/Family Education   Barriers to Discharge MD  Medical stability  Nursing Decreased caregiver support, Incontinence 1 level mobilie home ramped entry with son  PT Decreased caregiver support, Home environment Child psychotherapist, Insurance for SNF coverage, New oxygen    OT Incontinence, New oxygen    SLP       SW Insurance for SNF coverage     Team Discharge Planning: Destination: PT-Home ,OT- Home , SLP-Home Projected Follow-up: PT-24 hour supervision/assistance, OT-  Home health OT, SLP-Home Health SLP, 24 hour supervision/assistance, Outpatient SLP Projected Equipment Needs: PT-To be determined, OT- To be  determined, SLP-None recommended by SLP Equipment Details: PT- , OT-  Patient/family involved in discharge planning: PT- Patient,  OT-Patient, SLP-Patient  MD ELOS: 14-17 days Medical Rehab Prognosis:  Excellent Assessment: [name] is a 76 year old man who is admitted to CIR with functional deficits disconjugate gaze with altered mental status/acute hepatic metabolic encephalopathy/multifactorial secondary to right MCA scattered infarcts. Medications are being managed, and labs and vitals are being monitored regularly.      See Team Conference Notes for weekly updates to the plan of care

## 2021-01-15 NOTE — Progress Notes (Signed)
Occupational Therapy Session Note  Patient Details  Name: Dalton Miller MRN: [id number] Date of Birth: 26-Aug-1945  Today's Date: 01/15/2021 OT Individual Time: 7628-3151 OT Individual Time Calculation (min): 75 min    Short Term Goals: Week 1:  OT Short Term Goal 1 (Week 1): Patient will 1/3 parts of toileting task with Min A. OT Short Term Goal 2 (Week 1): Patient will complete stand-pivot to [facility] with Min A and LRAD. OT Short Term Goal 3 (Week 1): Patient will don LB clothing with Min A and good return demo of hemi technique. OT Short Term Goal 4 (Week 1): Patient will don UB clothing with Min A and good return demo of hemi technique.  Skilled Therapeutic Interventions/Progress Updates:  Pt greeted supine in bed  agreeable to OT intervention. Session focus on BADL reeducation, functional mobility, dynamic standing balance, LUE AAROM and decreasing overall caregiver burden. Pt completed supine>sit exiting to L side with MOD A needed to elevate trunk. MAX A to don pants from EOB, MIN A to stand from EOB with cues for hand plament. MIN A for stand pivot transfer from EOB>w/c to R side with RW. Pt declined need for further ADLs.  Pt transported to gym on [facility] with total A. Worked on seated AAROM with LUE. Pt completed x10 towels slides facilitating shoulder horizontal ABD/ADD and scapular protraction/retraction with LUE on top of towel  RUE providing hand over hand assist. Worked on visual attention as pt   presents with heavy R gaze preference. Pt completed line bisection therapeutic activity with pt only able to bisect 5 lines on R side out of 10. Education provided on visual compensation strategies with pt able to complete remainder of line bisection task with MIN cues. Worked on dynamic standing balance with an emphasis on L attention with pt instructed to stand and retrieve clothes pins placed on line. Pt retrieved 3 on R side using RUE but then reports urgency to void bowels. Pt able to ambulate in  to bathroom on [facility] gym with Rw and MODA, +BM. MAX A for toileting tasks d/t incontinency. Pt exited bathroom in similar fashion with pt transported back to room with total A where pt handed off to nursing for nurisg care.                      Pt on 2L [place] during session with SpO2 > 94%  Therapy Documentation Precautions:  Precautions Precautions: Fall Precaution Comments: L inattention; L hemi; post covid; recent R shoulder arthroscopy and debridement (12/17/20) Restrictions Weight Bearing Restrictions: No  Pain: no pain reported during session     Therapy/Group: Individual Therapy  Corinne Ports [facility] 01/15/2021, 12:14 PM

## 2021-01-16 LAB — GLUCOSE, CAPILLARY
Glucose-Capillary: 103 mg/dL — ABNORMAL HIGH (ref 70–99)
Glucose-Capillary: 145 mg/dL — ABNORMAL HIGH (ref 70–99)
Glucose-Capillary: 153 mg/dL — ABNORMAL HIGH (ref 70–99)
Glucose-Capillary: 227 mg/dL — ABNORMAL HIGH (ref 70–99)

## 2021-01-16 MED ORDER — SODIUM CHLORIDE 0.9% FLUSH
10.0000 mL | INTRAVENOUS | Status: DC | PRN
  Administered 2021-01-18: 10 mL

## 2021-01-16 NOTE — Progress Notes (Signed)
Physical Therapy Session Note  Patient Details  Name: Dalton Miller MRN: [id number] Date of Birth: [birth year]  Today's Date: 01/16/2021 PT Individual Time: 1101-1200 PT Individual Time Calculation (min): 59 min   Short Term Goals: Week 1:  PT Short Term Goal 1 (Week 1): Pt will complete bed mobility with minA and no hospital bed features PT Short Term Goal 2 (Week 1): Pt will complete bed<>chair transfers with modA and LRAD PT Short Term Goal 3 (Week 1): Pt will ambulate 52ft with modA and LRAD PT Short Term Goal 4 (Week 1): Pt will improve BERG score >7 points  Skilled Therapeutic Interventions/Progress Updates: Pt presents sitting in w/c and agreeable to therapy.  Pt wheeled to 5th floor gym for time conservation.  Pt performed multiple sit to stand transfers during session w/ min A and verbal cues for L hand placement.  Pt performed stepping to mat table w/ mod A w/ R UE support and verbal cues for LLE clearance and placement to maintain BOS.  Pt requires min facilitation for weight shift to right for LLE advancement.  Pt performed standing reaching forward and stacking/placing objects outside of BOS to left.  PT maintaining WB through LUE.  Pt requires occasional verbal cues for L knee extension w/ WB.  Pt states need to use BR.  Pt amb w/ RUE HHA and mod A to BR x 10'.  Pt requires verbal cues for use of UE and posture.  Pt required max A for clothing management.  Pt was incontinent of small amount of urine in brief.  Pt was unable to void in toilet.  Clean brief applied and NT called for assist w/ pulling clean brief and pants over hip w/ PT assist w/ standing using RUE.  NT to chart on incontinence.  Pt amb back to mat table w/ mod A and HHA.  Pt completed reaching to left and returning items to table, then seated bringing items to w/c seat.  Pt transferred sit to stand w/ min A and amb using RW and min A to mod A to w/c.  Pt returned to room and remained sitting in w/c w/ chair alarm on and all  needs in reach.     Therapy Documentation Precautions:  Precautions Precautions: Fall Precaution Comments: L inattention; L hemi; post covid; recent R shoulder arthroscopy and debridement (12/17/20) Restrictions Weight Bearing Restrictions: No General:   Vital Signs:   Pain:0/10   Mobility:       Therapy/Group: Individual Therapy  [name] 01/16/2021, 12:13 PM

## 2021-01-16 NOTE — Progress Notes (Addendum)
Speech Language Pathology Daily Session Note  Patient Details  Name: Dalton Miller MRN: [id number] Date of Birth: [birth year]  Today's Date: 01/16/2021 SLP Individual Time: 1400-1445 SLP Individual Time Calculation (min): 45 min  Short Term Goals: Week 1: SLP Short Term Goal 1 (Week 1): Pt will demonstrate alertness and sustained attention during functional task for 5 minutes with mod A verbal cues in 75% of opportunties. SLP Short Term Goal 2 (Week 1): Pt will demonstrate basic to mildly complex problem solving skills in fucntional tasks with mod A verbal cues. SLP Short Term Goal 3 (Week 1): Pt will demonstrate self monitoring of functional errors in problem solving tasks with mod A multimodal cues. SLP Short Term Goal 4 (Week 1): Pt will demonstrate recall of daily and novel information with mod A verbal cues to utilize compensatory aids. SLP Short Term Goal 5 (Week 1): Pt will scan left of midline in functional tasks to locate objects with 60% accuracy given max A multimodal cues. SLP Short Term Goal 6 (Week 1): Patient will tolerate regular solids, nectar thick liquids without overt s/s aspiration or penetration.  Skilled Therapeutic Interventions:   Patient seen for skilled ST session focused on cognitive function goals. Of note, patient reported that "the thickener doesn't even affect the taste" of his bottled waters. He was able to recall and describe earlier therapy sessions with PT  today without difficulty. SLP introduced peg board task and provided patient with instructions and demonstration. He was able to place pegs to match pattern with photo on the right side without significant difficulty, but he required mod-maxA visual, verbal cues to attend to left side and position pegs on left side of board. He required mod-maxA visual and verbal cues to self-monitor for errors but when asked, he does acknowledge that he has deficits on left side. Patient was left in [facility] with all needs within  reach. He continues to benefit from skilled SLP intervention to maximize cognitive and swallow function prior to discharge.  While SLP leaving, patient asked if he could leave hospital and go home with son and return in morning before his therapies started. SLP explained that although this was not possible, he could request grounds pass from MD.   Pain Pain Assessment Pain Scale: 0-10 Pain Score: 0-No pain  Therapy/Group: Individual Therapy  Sonia Baller, MA, CCC-SLP Speech Therapy

## 2021-01-16 NOTE — Progress Notes (Addendum)
Occupational Therapy Session Note  Patient Details  Name: Dalton Miller MRN: [id number] Date of Birth: 1945-03-31  Today's Date: 01/16/2021 OT Individual Time: 7530-0511 OT Individual Time Calculation (min): 57 min    Short Term Goals: Week 1:  OT Short Term Goal 1 (Week 1): Patient will 1/3 parts of toileting task with Min A. OT Short Term Goal 2 (Week 1): Patient will complete stand-pivot to [facility] with Min A and LRAD. OT Short Term Goal 3 (Week 1): Patient will don LB clothing with Min A and good return demo of hemi technique. OT Short Term Goal 4 (Week 1): Patient will don UB clothing with Min A and good return demo of hemi technique.  Skilled Therapeutic Interventions/Progress Updates:  Patient met seated in wc with lunch meal in front of him.  0/10 pain reported at rest and with activity. Noted swelling in LUE and BLE. All elevate at conclusion of treatment session. Would benefit from edema management (retrograde massage, taping etc....) for further control. Patient initially declined further set up assist but after education on poor positioning with meal tray too far away, patient allowed this writer to assist with cutting burger (patient attempting to cut burger with spoon as he was unable to locate utensils on L side of meal tray 2/2 L inattention). Per patient request this writer returned 15 minutes after scheduled start of treatment session to allow patient time to complete meal. Treatment session time shifted to make up for initial missed minutes. After completion of meal, session with focus on ADL re-education, short-distance functional mobility and activity tolerance. Functional mobility to commode in bathroom with Min A for walker management and cues to avoid bumping into items in room on L. Patient initially refusing need for toileting but in agreement with trying. 3/3 parts of toileting task (voided bladder and small BM) and LB dressing with Mod-Max A. Session concluded with patient  seated in wc with call bell within reach, belt alarm activated and all needs met.   Therapy Documentation Precautions:  Precautions Precautions: Fall Precaution Comments: L inattention; L hemi; post covid; recent R shoulder arthroscopy and debridement (12/17/20) Restrictions Weight Bearing Restrictions: No General:  Therapy/Group: Individual Therapy  Keil Pickering R How[place]-Davis 01/16/2021, 10:46 AM

## 2021-01-16 NOTE — Progress Notes (Signed)
Physical Therapy Session Note  Patient Details  Name: Dalton Miller MRN: [id number] Date of Birth: 1945-03-24  Today's Date: 01/16/2021 PT Individual Time: 0907-1007 PT Individual Time Calculation (min): 60 min   Short Term Goals: Week 1:  PT Short Term Goal 1 (Week 1): Pt will complete bed mobility with minA and no hospital bed features PT Short Term Goal 2 (Week 1): Pt will complete bed<>chair transfers with modA and LRAD PT Short Term Goal 3 (Week 1): Pt will ambulate 94ft with modA and LRAD PT Short Term Goal 4 (Week 1): Pt will improve BERG score >7 points  Skilled Therapeutic Interventions/Progress Updates:    Pt received supine in bed awake and agreeable to therapy session. Pt not wearing nasal cannula - SpO2 94% at rest in bed and 96% at rest in wheelchair. Noticed significant pitting edema in B LEs - per MD orders pt to wear TED hose during day - therapist retrieved and donned them total assist and educated pt on wear schedule. Donned shoes total assist. Supine>sitting R EOB, HOB flat but using bedrails, cuing for logroll technique to increase pt independence with light mod assist for B LE management and trunk upright. Sitting balance EOB with supervision.  R squat/partial stand pivot EOB>w/c with mod assist for lifting/pivoting hips and guarding L knee but not buckling. (Believe pt will be ready to initiate stand pivots using RW soon)  Transported to/from gym in w/c for time management and energy conservation.  Gait training at R hallway rail with +2 w/c follow with light mod assist primarily for balance - pt able to advance L LE during swing with therapist facilitating increased hip abduction as pt maintains very narrow BOS but not scissoring - pt able to maintain L knee control during stance with no signs of instability - cuing throughout to maintain trunk upright especially during L stance as pt has slight forward flexion during this.  Sit>stand w/c>RW with heavy min assist for  rising to stand and facilitating L hand placement on/off RW orthosis.  Gait training 119ft using RW with +2 w/c follow for safety and to allow longer distance - light mod assist of 1 for balance/AD management  - pt able to advance L LE without assist though with narrow BOS as noted at hallway rail and continues to have good L knee control in stance with no signs of instability - continues to require intermittent cuing to maintain trunk/hip extension for upright posture. SpO2 88% quickly increasing to 93% in <10 seconds on RA with HR 112bpm decreasing to 100bpm immediately following gait  Pt emotional throughout session with his progress and when talking about his wife who passed in 2016 - therapist provided emotional support and encouraged pt on his progress thus far.  Of note: Pt continues to demo R head turn and R gaze preference throughout session with therapist educating him on importance of visually scanning to L.  Therapist provided pt with B LE elevating leg rests to assist with edema management while up in chair. At end of session, pt left seated in w/c with needs in reach on his R side, seat belt alarm on, and L UE supported on 1/2 lap tray with pillow for skin protection.    Therapy Documentation Precautions:  Precautions Precautions: Fall Precaution Comments: L inattention; L hemi; post covid; recent R shoulder arthroscopy and debridement (12/17/20) Restrictions Weight Bearing Restrictions: No   Pain:  Reports pain near L great toe MTP joint that started a few days  prior with pt stating he thinks he rotated his foot the wrong way - does not limit participation in therapy - therapist donned shoes for increased support while WBing during session and provided seated rest breaks.    Therapy/Group: Individual Therapy  Tawana Scale , PT, DPT, NCS, CSRS  01/16/2021, 7:48 AM

## 2021-01-17 LAB — GLUCOSE, CAPILLARY
Glucose-Capillary: 129 mg/dL — ABNORMAL HIGH (ref 70–99)
Glucose-Capillary: 119 mg/dL — ABNORMAL HIGH (ref 70–99)
Glucose-Capillary: 129 mg/dL — ABNORMAL HIGH (ref 70–99)
Glucose-Capillary: 156 mg/dL — ABNORMAL HIGH (ref 70–99)

## 2021-01-17 NOTE — Progress Notes (Signed)
PROGRESS NOTE   Subjective/Complaints:  Feels ok today , has some neuropathic foot pain at noc but does not want to increase lyrica dose   ROS: denies CP, SOB, N/V  Objective:   No results found. No results for input(s): WBC, HGB, HCT, PLT in the last 72 hours.  No results for input(s): NA, K, CL, CO2, GLUCOSE, BUN, CREATININE, CALCIUM in the last 72 hours.   Intake/Output Summary (Last 24 hours) at 01/17/2021 0944 Last data filed at 01/16/2021 1900 Gross per 24 hour  Intake 360 ml  Output --  Net 360 ml         Physical Exam: Vital Signs Blood pressure 134/75, pulse 80, temperature 98.2 F (36.8 C), resp. rate 18, height 5\' 7"  (1.702 m), weight 82.6 kg, SpO2 98 %.  General: No acute distress Mood and affect are appropriate Heart: Regular rate and rhythm no rubs murmurs or extra sounds Lungs: Clear to auscultation, breathing unlabored, no rales or wheezes Abdomen: Positive bowel sounds, soft nontender to palpation, nondistended Extremities: No clubbing, cyanosis, or edema Skin: No evidence of breakdown, no evidence of rash  Skin: intact Neurological:     Mental Status: He is alert.     Comments: Patient is alert.  Makes eye contact with examiner.  Provides name and age but limited medical historian. Normal language. Speech sl dysarthric. Follows simple commands. Left central 7 and tongue deviation. Right gaze preference.  LUE 2 to 2+/5. LLE 3 to 3+/5. RUE and RLE 4 to 4+/5. No obvious sensory deficits. No limb ataxia apparent. Normal resting tone and DTR's 1+.     Assessment/Plan: 1. Functional deficits which require 3+ hours per day of interdisciplinary therapy in a comprehensive inpatient rehab setting. Physiatrist is providing close team supervision and 24 hour management of active medical problems listed below. Physiatrist and rehab team continue to assess barriers to discharge/monitor patient progress toward  functional and medical goals  Care Tool:  Bathing  Bathing activity did not occur: Refused Body parts bathed by patient: Left arm, Chest, Abdomen, Face   Body parts bathed by helper: Right arm, Left lower leg, Right lower leg, Left upper leg, Right upper leg, Buttocks, Front perineal area     Bathing assist Assist Level: Maximal Assistance - Patient 24 - 49%     Upper Body Dressing/Undressing Upper body dressing Upper body dressing/undressing activity did not occur (including orthotics): Refused What is the patient wearing?: Pull over shirt    Upper body assist Assist Level: Moderate Assistance - Patient 50 - 74%    Lower Body Dressing/Undressing Lower body dressing    Lower body dressing activity did not occur: Refused What is the patient wearing?: Pants     Lower body assist Assist for lower body dressing: Maximal Assistance - Patient 25 - 49%     Toileting Toileting    Toileting assist Assist for toileting: Maximal Assistance - Patient 25 - 49%     Transfers Chair/bed transfer  Transfers assist  Chair/bed transfer activity did not occur: Safety/medical concerns  Chair/bed transfer assist level: Moderate Assistance - Patient 50 - 74% (squat/partial stand pivot)     Locomotion Ambulation   Ambulation  assist   Ambulation activity did not occur: Safety/medical concerns  Assist level: 2 helpers (mod A of 1 and +2 w/c follow) Assistive device: Walker-rolling (w/ hand orthosis) Max distance: 162ft   Walk 10 feet activity   Assist  Walk 10 feet activity did not occur: Safety/medical concerns  Assist level: Moderate Assistance - Patient - 50 - 74% Assistive device: Hand held assist, Walker-rolling   Walk 50 feet activity   Assist Walk 50 feet with 2 turns activity did not occur: Safety/medical concerns         Walk 150 feet activity   Assist Walk 150 feet activity did not occur: Safety/medical concerns         Walk 10 feet on uneven  surface  activity   Assist Walk 10 feet on uneven surfaces activity did not occur: Safety/medical concerns         Wheelchair     Assist Is the patient using a wheelchair?: Yes Type of Wheelchair: Manual Wheelchair activity did not occur:  (fatigue at end of session)  Wheelchair assist level: Dependent - Patient 0%      Wheelchair 50 feet with 2 turns activity    Assist        Assist Level: Dependent - Patient 0%   Wheelchair 150 feet activity     Assist      Assist Level: Dependent - Patient 0%   Blood pressure 134/75, pulse 80, temperature 98.2 F (36.8 C), resp. rate 18, height 5\' 7"  (1.702 m), weight 82.6 kg, SpO2 98 %.    Medical Problem List and Plan: 1. Functional deficits  disconjugate gaze with altered mental status/acute hepatic metabolic encephalopathy/multifactorial secondary to right MCA scattered infarcts             -patient may shower             -ELOS/Goals: 14-17 days, min assist goals with PT, OT, SLP  Continue CIR  Messaged April to schedule HFU 2.  Impaired mobility -DVT/anticoagulation:  Mechanical: Antiembolism stockings, thigh (TED hose) Bilateral lower extremities             -antiplatelet therapy: continue Aspirin 81 mg daily 3. Pain Management: Tylenol held due to elevated LFTs 4. Mood: Provide emotional support             -antipsychotic agents: N/A 5. Neuropsych: This patient is capable of making decisions on his own behalf. 6. Skin/Wound Care: Routine skin checks 7. Fluids/Electrolytes/Nutrition: Routine in and outs with follow-up chemistries  8.  Acute blood loss anemia due to upper GI bleed esophageal varices.  Status post banding x4 12/30/2020.   -Continue Protonix twice daily.   CBC Latest Ref Rng & Units 01/13/2021 01/10/2021 01/08/2021  WBC 4.0 - 10.5 K/uL 11.8(H) 11.5(H) 12.0(H)  Hemoglobin 13.0 - 17.0 g/dL 9.9(L) 8.5(L) 9.5(L)  Hematocrit 39.0 - 52.0 % 31.4(L) 26.6(L) 30.8(L)  Platelets 150 - 400 K/uL 143(L) 178  224    -Follow-up per GI services 9.  Acute hypoxic respiratory failure.  Extubated 01/06/2020. Discussed importance of using incentive spirometer.  10.  Metastatic recurrent melanoma/left upper lobe lesion, mediastinal adenopathy.  Status post 4 cycles ipo.nivo.  Follow-up outpatient             -currently on oxygen. Wean to off as able 10.  COVID-19 infection.  Appeared incidental.  Isolation completed. 11.  Recurrent low-grade urothelial carcinoma.  Status post TURBT x3.  Follow-up outpatient.   12.  Elevated LFTs/hepatic cirrhosis.  Markedly improved.  Continue lactulose.  Holding Lasix/Spiro due to AKI 13.  AKI.  Status post solitary kidney status post nephrectomy due to [place]. Encouraged 6-8 glasses of water per day.  BMP Latest Ref Rng & Units 01/13/2021 01/10/2021 01/08/2021  Glucose 70 - 99 mg/dL 126(H) 131(H) 147(H)  BUN 8 - 23 mg/dL 20 28(H) 42(H)  Creatinine 0.61 - 1.24 mg/dL 1.38(H) 1.60(H) 1.88(H)  BUN/Creat Ratio 10 - 24 - - -  Sodium 135 - 145 mmol/L 145 145 146(H)  Potassium 3.5 - 5.1 mmol/L 4.2 3.7 3.7  Chloride 98 - 111 mmol/L 115(H) 116(H) 119(H)  CO2 22 - 32 mmol/L 22 20(L) 19(L)  Calcium 8.9 - 10.3 mg/dL 7.7(L) 7.7(L) 7.9(L)   Improving  14.  Diabetes mellitus.  Hemoglobin A1c 7.6.  SSI.            CBG (last 3)  Recent Labs    01/16/21 1706 01/16/21 2110 01/17/21 0611  GLUCAP 153* 227* 129*  Fair control some elevation yesterday pm   15.  Hypertension.  Well controlled, continue Norvasc 10 mg daily.  Monitor with increased mobility Vitals:   01/16/21 1920 01/17/21 0434  BP: 139/76 134/75  Pulse: 80 80  Resp: 18 18  Temp: 98.5 F (36.9 C) 98.2 F (36.8 C)  SpO2: 93% 98%    16.  Hyperlipidemia.  Lipitor resumed 10 mg daily 17. Obesity BMI 30.25-->28.80: provided dietary education. 18. Diarrhea: decrease lactulose to every other day     LOS: 5 days A FACE TO [place] E Drayk Humbarger 01/17/2021, 9:44 AM

## 2021-01-18 LAB — COMPREHENSIVE METABOLIC PANEL
ALT: 27 U/L (ref 0–44)
AST: 32 U/L (ref 15–41)
Albumin: 2.5 g/dL — ABNORMAL LOW (ref 3.5–5.0)
Alkaline Phosphatase: 120 U/L (ref 38–126)
Anion gap: 9 (ref 5–15)
BUN: 12 mg/dL (ref 8–23)
CO2: 24 mmol/L (ref 22–32)
Calcium: 8.4 mg/dL — ABNORMAL LOW (ref 8.9–10.3)
Chloride: 109 mmol/L (ref 98–111)
Creatinine, Ser: 1.17 mg/dL (ref 0.61–1.24)
GFR, Estimated: >60 mL/min (ref >60)
Glucose, Bld: 175 mg/dL — ABNORMAL HIGH (ref 70–99)
Potassium: 3.9 mmol/L (ref 3.5–5.1)
Sodium: 142 mmol/L (ref 135–145)
Total Bilirubin: 1.3 mg/dL — ABNORMAL HIGH (ref 0.3–1.2)
Total Protein: 6.3 g/dL — ABNORMAL LOW (ref 6.5–8.1)

## 2021-01-18 LAB — GLUCOSE, CAPILLARY
Glucose-Capillary: 113 mg/dL — ABNORMAL HIGH (ref 70–99)
Glucose-Capillary: 154 mg/dL — ABNORMAL HIGH (ref 70–99)
Glucose-Capillary: 116 mg/dL — ABNORMAL HIGH (ref 70–99)
Glucose-Capillary: 130 mg/dL — ABNORMAL HIGH (ref 70–99)

## 2021-01-18 MED ORDER — MELATONIN 3 MG PO TABS
3.0000 mg | ORAL_TABLET | Freq: Every day | ORAL | Status: DC
  Administered 2021-01-18 – 2021-01-23 (×6): 3 mg via ORAL
  Filled 2021-01-18 (×7): qty 1

## 2021-01-18 NOTE — Progress Notes (Signed)
PROGRESS NOTE   Subjective/Complaints: No new complaints to me, but nursing notes difficulty sleeping at night. Melatonin added Discussed Qutenza with him outpatient for neuropathic pain and he would like to try  ROS: denies CP, SOB, N/V, +peripheral neuropathy  Objective:   No results found. No results for input(s): WBC, HGB, HCT, PLT in the last 72 hours.  No results for input(s): NA, K, CL, CO2, GLUCOSE, BUN, CREATININE, CALCIUM in the last 72 hours.   Intake/Output Summary (Last 24 hours) at 01/18/2021 1132 Last data filed at 01/17/2021 1900 Gross per 24 hour  Intake 240 ml  Output --  Net 240 ml        Physical Exam: Vital Signs Blood pressure (!) 142/67, pulse 80, temperature 98.3 F (36.8 C), temperature source Oral, resp. rate 18, height 5\' 7"  (1.702 m), weight 79.6 kg, SpO2 95 %. Gen: no distress, normal appearing HEENT: oral mucosa pink and moist, NCAT Cardio: Reg rate Chest: normal effort, normal rate of breathing Abd: soft, non-distended Ext: no edema Psych: pleasant, normal affect   Skin: intact Neurological:     Mental Status: He is alert.     Comments: Patient is alert.  Makes eye contact with examiner.  Provides name and age but limited medical historian. Normal language. Speech sl dysarthric. Follows simple commands. Left central 7 and tongue deviation. Right gaze preference.  LUE 2 to 2+/5. LLE 3 to 3+/5. RUE and RLE 4 to 4+/5. No obvious sensory deficits. No limb ataxia apparent. Normal resting tone and DTR's 1+.     Assessment/Plan: 1. Functional deficits which require 3+ hours per day of interdisciplinary therapy in a comprehensive inpatient rehab setting. Physiatrist is providing close team supervision and 24 hour management of active medical problems listed below. Physiatrist and rehab team continue to assess barriers to discharge/monitor patient progress toward functional and medical  goals  Care Tool:  Bathing  Bathing activity did not occur: Refused Body parts bathed by patient: Left arm, Chest, Abdomen, Face   Body parts bathed by helper: Right arm, Left lower leg, Right lower leg, Left upper leg, Right upper leg, Buttocks, Front perineal area     Bathing assist Assist Level: Maximal Assistance - Patient 24 - 49%     Upper Body Dressing/Undressing Upper body dressing Upper body dressing/undressing activity did not occur (including orthotics): Refused What is the patient wearing?: Pull over shirt    Upper body assist Assist Level: Moderate Assistance - Patient 50 - 74%    Lower Body Dressing/Undressing Lower body dressing    Lower body dressing activity did not occur: Refused What is the patient wearing?: Pants     Lower body assist Assist for lower body dressing: Maximal Assistance - Patient 25 - 49%     Toileting Toileting    Toileting assist Assist for toileting: Maximal Assistance - Patient 25 - 49%     Transfers Chair/bed transfer  Transfers assist  Chair/bed transfer activity did not occur: Safety/medical concerns  Chair/bed transfer assist level: Moderate Assistance - Patient 50 - 74% (squat/partial stand pivot)     Locomotion Ambulation   Ambulation assist   Ambulation activity did not occur: Safety/medical concerns  Assist level: 2 helpers (mod A of 1 and +2 w/c follow) Assistive device: Walker-rolling (w/ hand orthosis) Max distance: 124ft   Walk 10 feet activity   Assist  Walk 10 feet activity did not occur: Safety/medical concerns  Assist level: Moderate Assistance - Patient - 50 - 74% Assistive device: Hand held assist, Walker-rolling   Walk 50 feet activity   Assist Walk 50 feet with 2 turns activity did not occur: Safety/medical concerns         Walk 150 feet activity   Assist Walk 150 feet activity did not occur: Safety/medical concerns         Walk 10 feet on uneven surface   activity   Assist Walk 10 feet on uneven surfaces activity did not occur: Safety/medical concerns         Wheelchair     Assist Is the patient using a wheelchair?: Yes Type of Wheelchair: Manual Wheelchair activity did not occur:  (fatigue at end of session)  Wheelchair assist level: Dependent - Patient 0%      Wheelchair 50 feet with 2 turns activity    Assist        Assist Level: Dependent - Patient 0%   Wheelchair 150 feet activity     Assist      Assist Level: Dependent - Patient 0%   Blood pressure (!) 142/67, pulse 80, temperature 98.3 F (36.8 C), temperature source Oral, resp. rate 18, height 5\' 7"  (1.702 m), weight 79.6 kg, SpO2 95 %.    Medical Problem List and Plan: 1. Functional deficits  disconjugate gaze with altered mental status/acute hepatic metabolic encephalopathy/multifactorial secondary to right MCA scattered infarcts             -patient may shower             -ELOS/Goals: 14-17 days, min assist goals with PT, OT, SLP  Continue CIR  Messaged April to schedule HFU 2.  Impaired mobility -DVT/anticoagulation:  Mechanical: Antiembolism stockings, thigh (TED hose) Bilateral lower extremities             -antiplatelet therapy: continue Aspirin 81 mg daily 3. Pain Management: Tylenol held due to elevated LFTs 4. Mood: Provide emotional support             -antipsychotic agents: N/A 5. Neuropsych: This patient is capable of making decisions on his own behalf. 6. Skin/Wound Care: Routine skin checks 7. Fluids/Electrolytes/Nutrition: Routine in and outs with follow-up chemistries  8.  Acute blood loss anemia due to upper GI bleed esophageal varices.  Status post banding x4 12/30/2020.   -Continue Protonix twice daily.   CBC Latest Ref Rng & Units 01/13/2021 01/10/2021 01/08/2021  WBC 4.0 - 10.5 K/uL 11.8(H) 11.5(H) 12.0(H)  Hemoglobin 13.0 - 17.0 g/dL 9.9(L) 8.5(L) 9.5(L)  Hematocrit 39.0 - 52.0 % 31.4(L) 26.6(L) 30.8(L)  Platelets 150 -  400 K/uL 143(L) 178 224    -Follow-up per GI services 9.  Acute hypoxic respiratory failure.  Extubated 01/06/2020. Discussed importance of using incentive spirometer.  10.  Metastatic recurrent melanoma/left upper lobe lesion, mediastinal adenopathy.  Status post 4 cycles ipo.nivo.  Follow-up outpatient             -currently on oxygen. Wean to off as able 10.  COVID-19 infection.  Appeared incidental.  Isolation completed. 11.  Recurrent low-grade urothelial carcinoma.  Status post TURBT x3.  Follow-up outpatient.   12.  Elevated LFTs/hepatic cirrhosis.  Markedly improved.  Continue lactulose.  Holding Lasix/Spiro due  to AKI. Check LFTs today.  13.  AKI.  Status post solitary kidney status post nephrectomy due to [place]. Encouraged 6-8 glasses of water per day. Check Cr today BMP Latest Ref Rng & Units 01/13/2021 01/10/2021 01/08/2021  Glucose 70 - 99 mg/dL 126(H) 131(H) 147(H)  BUN 8 - 23 mg/dL 20 28(H) 42(H)  Creatinine 0.61 - 1.24 mg/dL 1.38(H) 1.60(H) 1.88(H)  BUN/Creat Ratio 10 - 24 - - -  Sodium 135 - 145 mmol/L 145 145 146(H)  Potassium 3.5 - 5.1 mmol/L 4.2 3.7 3.7  Chloride 98 - 111 mmol/L 115(H) 116(H) 119(H)  CO2 22 - 32 mmol/L 22 20(L) 19(L)  Calcium 8.9 - 10.3 mg/dL 7.7(L) 7.7(L) 7.9(L)   Improving  14.  Diabetes mellitus.  Hemoglobin A1c 7.6.  SSI.            CBG (last 3)  Recent Labs    01/17/21 1651 01/17/21 2105 01/18/21 0617  GLUCAP 129* 156* 113*  Commended on improvements in CBG control.   15.  Hypertension.  Well controlled, continue Norvasc 10 mg daily.  Monitor with increased mobility Vitals:   01/17/21 1922 01/18/21 0331  BP: (!) 114/95 (!) 142/67  Pulse: 66 80  Resp: 18 18  Temp: 98.3 F (36.8 C) 98.3 F (36.8 C)  SpO2: 94% 95%    16.  Hyperlipidemia.  Lipitor resumed 10 mg daily 17. Obesity BMI 30.25-->28.80: provided dietary education. 18. Diarrhea: decrease lactulose to every other day     LOS: 6 days A FACE TO FACE EVALUATION WAS  PERFORMED  Martha Clan P Foye Haggart 01/18/2021, 11:32 AM

## 2021-01-18 NOTE — Progress Notes (Signed)
Occupational Therapy Session Note  Patient Details  Name: Dalton Miller MRN: [id number] Date of Birth: April 03, 1945  Today's Date: 01/18/2021 OT Individual Time: 1000-1113 OT Individual Time Calculation (min): 73 min    Short Term Goals: Week 1:  OT Short Term Goal 1 (Week 1): Patient will 1/3 parts of toileting task with Min A. OT Short Term Goal 2 (Week 1): Patient will complete stand-pivot to [facility] with Min A and LRAD. OT Short Term Goal 3 (Week 1): Patient will don LB clothing with Min A and good return demo of hemi technique. OT Short Term Goal 4 (Week 1): Patient will don UB clothing with Min A and good return demo of hemi technique.  Skilled Therapeutic Interventions/Progress Updates:    Pt semi reclined in bed, reporting fatigue initially but with mild encouragement pt agreeable to completing sinkside oral hygiene.  Pt completed left roll with supervision, sidelying to sit with min assist using bed rails.  Pt donned pants with min assist and mod Vcs for sequencing.  Min assist sit to stand and ambulated to sink using RW with cueing for tight space negotiation, RW mgt and attending to LUE and LLE.    Pt required clutter reduction to increase success during visual scanning in order to locate ADL items on counter.  Pt having difficulty locating items on both left and right side of sink needing mod verbal cues to complete.  Pt also needing intermittent step by step verbal cues to sequence through oral hygiene tasks including brushing teeth, soaking and cleaning dentures, and applying denture adhesive.  Pt pausing mid task periodically and staring blankly needing cues to resume task.  Educated pt throughout on hemi techniques including opening/closing toothpaste cap, and applying toothpaste and denture adhesive.  Pt completed half of task standing at sink then needing to resume in seated position due to reporting onset of low back pain 8/10 level (pt reports low back pain is premorbid).  While pt  was standing, he needed CGA and intermittent cues to check posture using mirror for visual feedback. Pt able to self correct posture after cues provided to attend to in mirror.  Pt also tending to neglect LUE and leave it hanging at side during task needing cues and mod assist to place in supported position.  Call bell in reach, seat alarm on, left half lap tray supporting LUE.   Therapy Documentation Precautions:  Precautions Precautions: Fall Precaution Comments: L inattention; L hemi; post covid; recent R shoulder arthroscopy and debridement (12/17/20) Restrictions Weight Bearing Restrictions: No    Therapy/Group: Individual Therapy  Ezekiel Slocumb 01/18/2021, 12:38 PM

## 2021-01-18 NOTE — Progress Notes (Signed)
Physical Therapy Session Note  Patient Details  Name: Dalton Miller MRN: [id number] Date of Birth: 07/08/1945  Today's Date: 01/18/2021 PT Individual Time: 1300-1415 PT Individual Time Calculation (min): 75 min   Short Term Goals: Week 1:  PT Short Term Goal 1 (Week 1): Pt will complete bed mobility with minA and no hospital bed features PT Short Term Goal 2 (Week 1): Pt will complete bed<>chair transfers with modA and LRAD PT Short Term Goal 3 (Week 1): Pt will ambulate 72f with modA and LRAD PT Short Term Goal 4 (Week 1): Pt will improve BERG score >7 points  Skilled Therapeutic Interventions/Progress Updates:     Pt presents sitting in w/c with family members at the bedside. Pt agreeable to PT tx and denies pain. Pt on RA on arrival, resting O2 at 95% and O2 remains >96% throughout session on RA, including with mobility.   Transported in w/c downstairs to 98M rehab gym for time and assisted to mat table with stand<>pivot minA transfer without AD. Pt c/o feeling "weak in the legs." Completed active warm up of 2x5 sit<>stands with minA and no AD. Instructed in functional gait training, ambulating 566f+ [id number] [id number] minA and RW. Assist primarily needed for steering RW, managing LUE in RW splint (strap not keeping hand in splint), and mild steadying. VC required for increasing L step/foot clearance during swing and keeping body within walker frame, especially with fatigue.   Completed dynamic standing balance tasks with ball toss with no UE support and +2 assist for guarding with ball toss bias to L to promote L visual scanning and tracking and ball toss outside BOS to encourage reaching with his stronger RUE. Completed standing toe taps, 2x10, with RW support and minA guard, VC for increasing L hip flexion to improve foot clearance which he responded well to.   Completed car transfer with car height simulating mid-sive SUV - pt with difficulty scooting hips posteriorly into car and then  required modA for lifting BLE into car - pt reports baseline difficulties due to limited R knee flexion from prior TKA - will benefit from further ed and practice prior to DC.   Pt returned upstairs to his room in w/c for time and pt remained seated in w/c with 1/2 lap tray supporting LUE, call bell in reach, and safety belt alarm on. All needs met.   Therapy Documentation Precautions:  Precautions Precautions: Fall Precaution Comments: L inattention; L hemi; post covid; recent R shoulder arthroscopy and debridement (12/17/20) Restrictions Weight Bearing Restrictions: No General:    Therapy/Group: Individual Therapy  ChriAlger Simons6/2023, 7:38 AM

## 2021-01-18 NOTE — Progress Notes (Signed)
Speech Language Pathology Daily Session Note  Patient Details  Name: Dalton Miller MRN: [id number] Date of Birth: 11-28-1945  Today's Date: 01/18/2021 SLP Individual Time: 8315-1761 SLP Individual Time Calculation (min): 45 min  Short Term Goals: Week 1: SLP Short Term Goal 1 (Week 1): Pt will demonstrate alertness and sustained attention during functional task for 5 minutes with mod A verbal cues in 75% of opportunties. SLP Short Term Goal 2 (Week 1): Pt will demonstrate basic to mildly complex problem solving skills in fucntional tasks with mod A verbal cues. SLP Short Term Goal 3 (Week 1): Pt will demonstrate self monitoring of functional errors in problem solving tasks with mod A multimodal cues. SLP Short Term Goal 4 (Week 1): Pt will demonstrate recall of daily and novel information with mod A verbal cues to utilize compensatory aids. SLP Short Term Goal 5 (Week 1): Pt will scan left of midline in functional tasks to locate objects with 60% accuracy given max A multimodal cues. SLP Short Term Goal 6 (Week 1): Patient will tolerate regular solids, nectar thick liquids without overt s/s aspiration or penetration.  Skilled Therapeutic Interventions:Skilled ST services focused on cognitive skills. SLP communicated with pt pertaining to s/s aspiration when consuming thin liquids, expressing difficulty prior to acute CVA but increase in s/s since acute CVA. SLP educated pt on planned instrumental swallow assessment to identify deficits and to create most effective treatment plan. All questions answered to satisfaction. SLP facilitated left scanning in field of 6 cards when sorting by color, pt initially required max A verbal and moderate A visual cues to scan left of midline, however pt demonstrated increased carryover of skills towards the end of the task requiring only moderate A verbal cues. Pt's fatigue began to impact left scanning, problem solving and error awareness in the second present task,  Blink played at a basic level. Pt was able to recall 3 rules with min A verbal cues, but required max A then total A to scan left of midline. Treatment session ended early to allow pt a rest break before OT arrived after ST's planned session. Pt missed 15 minutes of treatment due to fatigue. Pt was left in room with call bell within reach and bed alarm set. SLP recommends to continue skilled services.     Pain Pain Assessment Pain Scale: 0-10 Pain Score: 0-No pain  Therapy/Group: Individual Therapy  Deannah Rossi  [facility] 01/18/2021, 10:04 AM

## 2021-01-19 ENCOUNTER — Ambulatory Visit (HOSPITAL_COMMUNITY): Payer: No Typology Code available for payment source | Admitting: Occupational Therapy

## 2021-01-19 ENCOUNTER — Inpatient Hospital Stay (HOSPITAL_COMMUNITY): Payer: No Typology Code available for payment source

## 2021-01-19 LAB — GLUCOSE, CAPILLARY
Glucose-Capillary: 103 mg/dL — ABNORMAL HIGH (ref 70–99)
Glucose-Capillary: 146 mg/dL — ABNORMAL HIGH (ref 70–99)
Glucose-Capillary: 170 mg/dL — ABNORMAL HIGH (ref 70–99)
Glucose-Capillary: 129 mg/dL — ABNORMAL HIGH (ref 70–99)

## 2021-01-19 NOTE — Progress Notes (Signed)
Physical Therapy Session Note  Patient Details  Name: Dalton Miller MRN: [id number] Date of Birth: [birth year]  Today's Date: 01/19/2021 PT Individual Time: 1300-1355 PT Individual Time Calculation (min): 55 min   Short Term Goals: Week 1:  PT Short Term Goal 1 (Week 1): Pt will complete bed mobility with minA and no hospital bed features PT Short Term Goal 2 (Week 1): Pt will complete bed<>chair transfers with modA and LRAD PT Short Term Goal 3 (Week 1): Pt will ambulate 7ft with modA and LRAD PT Short Term Goal 4 (Week 1): Pt will improve BERG score >7 points  Skilled Therapeutic Interventions/Progress Updates:     Pt sitting in w/c to start session - on 1.5L O2 saturating 99%. On RA, resting he remained 98% and throughout session on RA, remained >94%, even after 6MWT. Updated RN on pt's tolerance to mobility on RA in effort to wean supplemental O2.   Noted swelling in LE's L>R - retrieved knee-high compression socks with MD order stating TED's when OOB.   Pt transported in w/c to 5N tower bridge to focus remainder of session on functional gait training. Pt ambulated 168ft with minA and RW + ~43ft with min/modA and HHA on R. Primary gait deficits include forward flexed trunk, decreased ability to keep body within walker frame, decreased ability to keep RW in straight path with veering L, poor L foot clearance, and decreased L hip/knee flexion in swing. Pt with x1 LOB while turning to the R due to L hand slipping from RW splint, requiring modA for balance correction.   Pt then instructed in 6MWT - ambulating 228ft with RW and minA with similar deficits as above, providing hand-over-hand assist to keep LUE on RW splint (strap broken and pt with poor LUE awareness). Age norm for age and sex is 1739ft.    Pt returned to room in w/c for time and energy conservation. Remained seated in w/c with LUE supported with lap trap, safety belt alarm on, all immediate needs within reach.  Therapy  Documentation Precautions:  Precautions Precautions: Fall Precaution Comments: L inattention; L hemi; post covid; recent R shoulder arthroscopy and debridement (12/17/20) Restrictions Weight Bearing Restrictions: No General:     Therapy/Group: Individual Therapy  [name] 01/19/2021, 7:40 AM

## 2021-01-19 NOTE — Progress Notes (Addendum)
Modified Barium Swallow Progress Note  Patient Details  Name: Dalton Miller MRN: [id number] Date of Birth: 06/06/1945  Today's Date: 01/19/2021  Modified Barium Swallow completed.  Full report located under Chart Review in the Imaging Section.  Brief recommendations include the following:  Clinical Impression   Pt presents with mild oropharyngeal dysphagia characterized by delay timing in laryngeal vestibule closure and premature spillage to pyriform sinuses, resulting in sensed aspiration before/during the swallow of large sips of thin liquids via cup or straw. Oral phase is characterized by mild increase mastication time of dys 3 textures due to missing dentition, inconsistent premature spillage of thin liquids and mild swallow initiation noted when consuming barium tablet with puree textures/thin liquids. Pharyngeal phase appears functional except for when consuming fastmoving thin liquids, the delayed timing of laryngeal vestibule closure and premature spillage (impacted by reduced attention), resulting in moderately consistent penetration (PAS 2 and PAS 3) with small sips and consistent sensed s/s aspiration with large sips. The compensatory strategy of chin tuck when consuming cued small sips resulted in cleared penetration with second swallow (PAS 2 and PAS 3 ) in 2 out 3 trials with no penetration on 1 out 3 trials. The increase in viscosity of nectar thick liquids, dys 1 textures and dys 3 textures allow more time for laryngeal vestibule closure during the swallow resulting in no penetration nor aspiration.  SLP recommends medication administered whole in puree or crushed if needed. A diet of regular textures and thin liquids via provable cup to ensure small sips are consumed due to pt's cognitive deficits and reduced carryover of strategies verse use of chin tuck strategy. SLP will focus on tolerance of diet and strategies with provable cup and use of second swallow along with rehabilitative  exercise to increase closure timing and oral control (suggest 3 second prep, supraglottic swallow, and Mendelsohn exercises) The increase of thin liquid amount as well as adjustments in compensatory strategies can be completed at bedside.    Swallow Evaluation Recommendations       SLP Diet Recommendations: Regular solids;Thin liquid (with 10cc provale cup)   Liquid Administration via: Cup (provale cup)   Medication Administration: Whole meds with puree   Supervision: Full supervision/cueing for compensatory strategies (strategies and assist feeding)   Compensations: Minimize environmental distractions;Small sips/bites;Multiple dry swallows after each bite/sip;Other (Comment) (provale cup with thin liquids)   Postural Changes: Seated upright at 90 degrees   Oral Care Recommendations: Oral care BID        Diella Gillingham 01/19/2021,12:18 PM

## 2021-01-19 NOTE — Progress Notes (Signed)
Speech Language Pathology Weekly Progress and Session Note  Patient Details  Name: Dalton Miller MRN: [id number] Date of Birth: Mar 19, 1945  Beginning of progress report period: January 13, 2021 End of progress report period: January 19, 2021  Today's Date: 01/19/2021 SLP Individual Time: 5102-5852 SLP Individual Time Calculation (min): 28 min  Short Term Goals: Week 1: SLP Short Term Goal 1 (Week 1): Pt will demonstrate alertness and sustained attention during functional task for 5 minutes with mod A verbal cues in 75% of opportunties. SLP Short Term Goal 1 - Progress (Week 1): Met SLP Short Term Goal 2 (Week 1): Pt will demonstrate basic to mildly complex problem solving skills in fucntional tasks with mod A verbal cues. SLP Short Term Goal 2 - Progress (Week 1): Not met SLP Short Term Goal 3 (Week 1): Pt will demonstrate self monitoring of functional errors in problem solving tasks with mod A multimodal cues. SLP Short Term Goal 3 - Progress (Week 1): Not met SLP Short Term Goal 4 (Week 1): Pt will demonstrate recall of daily and novel information with mod A verbal cues to utilize compensatory aids. SLP Short Term Goal 4 - Progress (Week 1): Met SLP Short Term Goal 5 (Week 1): Pt will scan left of midline in functional tasks to locate objects with 60% accuracy given max A multimodal cues. SLP Short Term Goal 5 - Progress (Week 1): Met SLP Short Term Goal 6 (Week 1): Patient will tolerate regular solids, nectar thick liquids without overt s/s aspiration or penetration. SLP Short Term Goal 6 - Progress (Week 1): Met    New Short Term Goals: Week 2: SLP Short Term Goal 1 (Week 2): Pt will demonstrate alertness and sustained attention during functional task for 10-15 minutes with min A verbal cues in 75% of opportunties. SLP Short Term Goal 2 (Week 2): Pt will demonstrate basic to mildly complex problem solving skills in fucntional tasks with mod A verbal cues. SLP Short Term Goal 3 (Week  2): Pt will demonstrate self monitoring of functional errors in problem solving tasks with mod A multimodal cues. SLP Short Term Goal 4 (Week 2): Pt will demonstrate recall of daily and novel information with min A verbal cues to utilize compensatory aids. SLP Short Term Goal 5 (Week 2): Pt will scan left of midline in functional tasks to locate objects with 70% accuracy given mod A multimodal cues. SLP Short Term Goal 6 (Week 2): Pt will demonstarte use of swallow strategies on current diet reagular textures and thin liquids via provale cup with minimal overt s/s aspiration and min A verbal cues.  Weekly Progress Updates: Pt made good progress meeting 4 out 6 goals. Pt participated in BSS and then MBS due to s/s aspiration on thin liquids and was downgraded to nectar thick liquids prior to instrumental assessment. Pt demonstrated sensed aspiration of thin liquids with large bolus sizes on MBS and a controled sip of provale cup 10 cc with use of second swallow was recommended. Pt demonstrated slow improvement in left scanning, alertness/sustained attention. Pt's lethargy and attention continue to be barriers to d/c, impacting short term recall, basic-mildly complex problem solving, error awareness and carryover of left scanning strategies. Pt would continue to benefit from skilled ST services in order to maximize functional independence and reduce burden of care, requiring 24 hour supervision and continued ST services.     Intensity: Minumum of 1-2 x/day, 30 to 90 minutes Frequency: 3 to 5 out of 7 days Duration/Length  of Stay: 2.5 weeks Treatment/Interventions: Cognitive remediation/compensation;Cueing hierarchy;Medication managment;Patient/family education;Internal/external aids;Functional tasks   Daily Session  Skilled Therapeutic Interventions:  Skilled ST services focused on swallow skills. SLP provided education pertaining to MBS results noting deficits of premature spillage and swallow delay  resulting in sensed aspiration of thin liquids in large bolus sizes. SLP introduced provale cup (10 cc) and use of second swallow to reduce s/s aspiration on thin liquids. Pt returned demonstration of strategies while using the provale cup with mod A verbal cues, x1 occurrence of immediate cough when second swallow strategy was not utilized. SLP provided to incoming OT. Pt was left with OT. Recommend to continue ST services.     General    Pain Pain Assessment Pain Score: 0-No pain  Therapy/Group: Individual Therapy  Honest Vanleer  [facility] 01/19/2021, 12:35 PM

## 2021-01-19 NOTE — Progress Notes (Signed)
PROGRESS NOTE   Subjective/Complaints: No new complaints this morning Sleepy Discussed with Dalton Miller that it is ok to shave with supervision Discussed with team yesterday patient's current function  ROS: denies CP, SOB, N/V, +peripheral neuropathy, wrist pain improved  Objective:   No results found. No results for input(s): WBC, HGB, HCT, PLT in the last 72 hours.  Recent Labs    01/18/21 1215  NA 142  K 3.9  CL 109  CO2 24  GLUCOSE 175*  BUN 12  CREATININE 1.17  CALCIUM 8.4*     Intake/Output Summary (Last 24 hours) at 01/19/2021 1229 Last data filed at 01/18/2021 1859 Gross per 24 hour  Intake 478 ml  Output --  Net 478 ml        Physical Exam: Vital Signs Blood pressure (!) 141/79, pulse 85, temperature 98 F (36.7 C), resp. rate 17, height 5\' 7"  (1.702 m), weight 81.3 kg, SpO2 98 %. Gen: no distress, normal appearing HEENT: oral mucosa pink and moist, NCAT Cardio: Reg rate Chest: normal effort, normal rate of breathing Abd: soft, non-distended Ext: no edema Psych: pleasant, normal affect  Skin: intact Neurological:     Mental Status: He is alert.     Comments: Patient is alert.  Makes eye contact with examiner.  Provides name and age but limited medical historian. Normal language. Speech sl dysarthric. Follows simple commands. Left central 7 and tongue deviation. Right gaze preference.  LUE 2 to 2+/5. LLE 3 to 3+/5. RUE and RLE 4 to 4+/5. No obvious sensory deficits. No limb ataxia apparent. Normal resting tone and DTR's 1+.     Assessment/Plan: 1. Functional deficits which require 3+ hours per day of interdisciplinary therapy in a comprehensive inpatient rehab setting. Physiatrist is providing close team supervision and 24 hour management of active medical problems listed below. Physiatrist and rehab team continue to assess barriers to discharge/monitor patient progress toward functional and medical  goals  Care Tool:  Bathing  Bathing activity did not occur: Refused Body parts bathed by patient: Left arm, Chest, Abdomen, Face   Body parts bathed by helper: Right arm, Left lower leg, Right lower leg, Left upper leg, Right upper leg, Buttocks, Front perineal area     Bathing assist Assist Level: Maximal Assistance - Patient 24 - 49%     Upper Body Dressing/Undressing Upper body dressing Upper body dressing/undressing activity did not occur (including orthotics): Refused What is the patient wearing?: Pull over shirt    Upper body assist Assist Level: Moderate Assistance - Patient 50 - 74%    Lower Body Dressing/Undressing Lower body dressing    Lower body dressing activity did not occur: Refused What is the patient wearing?: Pants     Lower body assist Assist for lower body dressing: Maximal Assistance - Patient 25 - 49%     Toileting Toileting    Toileting assist Assist for toileting: Maximal Assistance - Patient 25 - 49%     Transfers Chair/bed transfer  Transfers assist  Chair/bed transfer activity did not occur: Safety/medical concerns  Chair/bed transfer assist level: Moderate Assistance - Patient 50 - 74% (squat/partial stand pivot)     Locomotion Ambulation  Ambulation assist   Ambulation activity did not occur: Safety/medical concerns  Assist level: 2 helpers (mod A of 1 and +2 w/c follow) Assistive device: Walker-rolling (w/ hand orthosis) Max distance: 176ft   Walk 10 feet activity   Assist  Walk 10 feet activity did not occur: Safety/medical concerns  Assist level: Moderate Assistance - Patient - 50 - 74% Assistive device: Hand held assist, Walker-rolling   Walk 50 feet activity   Assist Walk 50 feet with 2 turns activity did not occur: Safety/medical concerns         Walk 150 feet activity   Assist Walk 150 feet activity did not occur: Safety/medical concerns         Walk 10 feet on uneven surface   activity   Assist Walk 10 feet on uneven surfaces activity did not occur: Safety/medical concerns         Wheelchair     Assist Is the patient using a wheelchair?: Yes Type of Wheelchair: Manual Wheelchair activity did not occur:  (fatigue at end of session)  Wheelchair assist level: Dependent - Patient 0%      Wheelchair 50 feet with 2 turns activity    Assist        Assist Level: Dependent - Patient 0%   Wheelchair 150 feet activity     Assist      Assist Level: Dependent - Patient 0%   Blood pressure (!) 141/79, pulse 85, temperature 98 F (36.7 C), resp. rate 17, height 5\' 7"  (1.702 m), weight 81.3 kg, SpO2 98 %.    Medical Problem List and Plan: 1. Functional deficits  disconjugate gaze with altered mental status/acute hepatic metabolic encephalopathy/multifactorial secondary to right MCA scattered infarcts             -patient may shower             -ELOS/Goals: 14-17 days, min assist goals with PT, OT, SLP  Continue CIR  Messaged April to schedule HFU 2.  Impaired mobility -DVT/anticoagulation:  Mechanical: Antiembolism stockings, thigh (TED hose) Bilateral lower extremities             -antiplatelet therapy: continue Aspirin 81 mg daily 3. Pain Management: Tylenol held due to elevated LFTs 4. Mood: Provide emotional support             -antipsychotic agents: N/A 5. Neuropsych: This patient is capable of making decisions on his own behalf. 6. Skin/Wound Care: Routine skin checks 7. Fluids/Electrolytes/Nutrition: Routine in and outs with follow-up chemistries  8.  Acute blood loss anemia due to upper GI bleed esophageal varices.  Status post banding x4 12/30/2020.   -continue Protonix twice daily.   CBC Latest Ref Rng & Units 01/13/2021 01/10/2021 01/08/2021  WBC 4.0 - 10.5 K/uL 11.8(H) 11.5(H) 12.0(H)  Hemoglobin 13.0 - 17.0 g/dL 9.9(L) 8.5(L) 9.5(L)  Hematocrit 39.0 - 52.0 % 31.4(L) 26.6(L) 30.8(L)  Platelets 150 - 400 K/uL 143(L) 178 224     -Follow-up per GI services 9.  Acute hypoxic respiratory failure.  Extubated 01/06/2020. Discussed importance of using incentive spirometer.  10.  Metastatic recurrent melanoma/left upper lobe lesion, mediastinal adenopathy.  Status post 4 cycles ipo.nivo.  Follow-up outpatient             -currently on oxygen. Wean to off as able 10.  COVID-19 infection.  Appeared incidental.  Isolation completed. 11.  Recurrent low-grade urothelial carcinoma.  Status post TURBT x3.  Follow-up outpatient.   12.  Elevated LFTs/hepatic cirrhosis.  Markedly improved.  Continue lactulose.  Holding Lasix/Spiro due to AKI. Check LFTs today.  13.  AKI.  Status post solitary kidney status post nephrectomy due to [place]. Encouraged 6-8 glasses of water per day. Check Cr today BMP Latest Ref Rng & Units 01/18/2021 01/13/2021 01/10/2021  Glucose 70 - 99 mg/dL 175(H) 126(H) 131(H)  BUN 8 - 23 mg/dL 12 20 28(H)  Creatinine 0.61 - 1.24 mg/dL 1.17 1.38(H) 1.60(H)  BUN/Creat Ratio 10 - 24 - - -  Sodium 135 - 145 mmol/L 142 145 145  Potassium 3.5 - 5.1 mmol/L 3.9 4.2 3.7  Chloride 98 - 111 mmol/L 109 115(H) 116(H)  CO2 22 - 32 mmol/L 24 22 20(L)  Calcium 8.9 - 10.3 mg/dL 8.4(L) 7.7(L) 7.7(L)   Improving  14.  Diabetes mellitus.  Hemoglobin A1c 7.6.  SSI.            CBG (last 3)  Recent Labs    01/18/21 2034 01/19/21 0551 01/19/21 1201  GLUCAP 130* 103* 146*  Commended on improvements in CBG control.   15.  Hypertension.  Well controlled, continue Norvasc 10 mg daily.  Monitor with increased mobility Vitals:   01/18/21 1943 01/19/21 0457  BP: 131/66 (!) 141/79  Pulse: 84 85  Resp: 18 17  Temp: 98.5 F (36.9 C) 98 F (36.7 C)  SpO2: 97% 98%    16.  Hyperlipidemia.  Lipitor resumed 10 mg daily 17. Obesity BMI 30.25-->28.80: provided dietary education. 18. Diarrhea: decrease lactulose to every other day  19. Mild oropharyngeal dysphagia: continue SLP    LOS: 7 days A FACE TO FACE EVALUATION WAS  PERFORMED  Dalton Miller 01/19/2021, 12:29 PM

## 2021-01-19 NOTE — Progress Notes (Signed)
Occupational Therapy Weekly Progress Note  Patient Details  Name: Dalton Miller MRN: [id number] Date of Birth: Oct 22, 1945  Beginning of progress report period: January 13, 2021 End of progress report period: January 19, 2021  Today's Date: 01/19/2021 OT Individual Time: 3976-7341 OT Individual Time Calculation (min): 75 min    Patient has met 3 of 4 short term goals.  Pt is progressing steadily and as expected with skilled OT intervention.  Pt is consistently pleasant and motivated to participate and receptive to skilled training.  Pt currently requires mod assist for LB dressing and UB bathing, min assist for toileting, UB dressing and LB bathing.  Pt also requires mod to max cues for follow through of visual scanning and left attention in order to retrieve desired ADL items and also to avoid obstacles on left side during functional mobility.  Pt has proximal return of LUE with little to no return distally, and also exacerbated by left sided neglect. Barriers to progress are listed below and also presents with urinary incontinence. Pt will have 24 hour supervision at home by daughter in law, who will benefit from family education and training.  Pt will benefit from training on use of wash mitt and long handled sponge and reacher to increase independence with self care tasks.  Patient continues to demonstrate the following deficits: muscle weakness and muscle paralysis, decreased cardiorespiratoy endurance and decreased oxygen support, impaired timing and sequencing, abnormal tone, motor apraxia, decreased coordination, and decreased motor planning, decreased visual perceptual skills, decreased visual motor skills, and field cut, decreased attention to left, decreased attention to right, left side neglect, decreased motor planning, and ideational apraxia, decreased initiation, decreased attention, decreased awareness, decreased problem solving, decreased safety awareness, decreased memory, and delayed  processing, and decreased sitting balance, decreased standing balance, decreased postural control, hemiplegia, and decreased balance strategies and therefore will continue to benefit from skilled OT intervention to enhance overall performance with BADL.  Patient progressing toward long term goals..  Continue plan of care.  OT Short Term Goals Week 1:  OT Short Term Goal 1 (Week 1): Patient will 1/3 parts of toileting task with Min A. OT Short Term Goal 1 - Progress (Week 1): Met OT Short Term Goal 2 (Week 1): Patient will complete stand-pivot to [facility] with Min A and LRAD. OT Short Term Goal 2 - Progress (Week 1): Met OT Short Term Goal 3 (Week 1): Patient will don LB clothing with Min A and good return demo of hemi technique. OT Short Term Goal 3 - Progress (Week 1): Not progressing OT Short Term Goal 4 (Week 1): Patient will don UB clothing with Min A and good return demo of hemi technique. OT Short Term Goal 4 - Progress (Week 1): Met Week 2:  OT Short Term Goal 1 (Week 2): Pt will complete LB dressing with min assist using hemi techniques with min VCs. OT Short Term Goal 2 (Week 2): Pt will complete self feeding with intermittent supervision using AE as needed. OT Short Term Goal 3 (Week 2): Pt will complete toileting 3/3 with min assist bowel and bladder. OT Short Term Goal 4 (Week 2): Pt will be able to scan sink counter top and retrieve needed ADL items for oral hygiene with min cues.  Skilled Therapeutic Interventions/Progress Updates:    Pt semi reclined in bed, agreeable to washing up at sink.  Pt requesting to use toilet first.  Supine to sit with mod assist and step by step cues for body  mechanics.  Sit to stand at [facility] with min assist for manual placement left hand on walker splint.  Pt ambulated to bathroom with min assist and completed toilet transfer with total assist for clothing management due to urinary urgency and ultimately incontinent needing assist to manage soiled clothing and  floor.  Pt had continent episode of bowel (see flowsheet).  Pericare completed with min assist and donned brief with total assist.  Pt ambulated to sink needing min assist primarily to manage RW around obstacles due to lack of attention to left side by patient despite providing cues.  Pt doffed shirt with min assist and bathed UB with mod assist including hoh assist to use left hand to wash right side of body.  Pt donned clean shirt with min assist to thread left hand into sleeve.  Pt donned pants with min assist to thread over feet.  Needing max assist to donn socks due to impaired reach and unable to achieve figure 4 due to weakness and tightness and hips and knees.  Pt sitting up in w/c, call bell in reach, left half lap tray supporting LUE, seat alarm on.  Therapy Documentation Precautions:  Precautions Precautions: Fall Precaution Comments: L inattention; L hemi; post covid; recent R shoulder arthroscopy and debridement (12/17/20) Restrictions Weight Bearing Restrictions: No    Therapy/Group: Individual Therapy  Ezekiel Slocumb 01/19/2021, 2:57 PM

## 2021-01-20 LAB — GLUCOSE, CAPILLARY
Glucose-Capillary: 106 mg/dL — ABNORMAL HIGH (ref 70–99)
Glucose-Capillary: 140 mg/dL — ABNORMAL HIGH (ref 70–99)
Glucose-Capillary: 94 mg/dL (ref 70–99)
Glucose-Capillary: 218 mg/dL — ABNORMAL HIGH (ref 70–99)

## 2021-01-20 MED ORDER — FUROSEMIDE 20 MG PO TABS
10.0000 mg | ORAL_TABLET | Freq: Once | ORAL | Status: AC
  Administered 2021-01-20: 10 mg via ORAL
  Filled 2021-01-20: qty 1

## 2021-01-20 NOTE — Progress Notes (Addendum)
Patient ID: EPIC TRIBBETT, male   DOB: 09-Sep-1945, 76 y.o.   MRN: [id number] Met with pt to discuss team conference goals of CGA level which is an upgrade from min assist level. Target discharge date of 1/27. Also contacted her daughter in-law-Dawn to give the same information and set up family education for next week. She and his son were on the phone and have scheduled family education for Tuesday 1/24 at 9:00-12:00. Both of them will be present will let team know and work on discharge needs. All very pleased with his progress this week.

## 2021-01-20 NOTE — Progress Notes (Signed)
Occupational Therapy Session Note  Patient Details  Name: Dalton Miller MRN: [id number] Date of Birth: 1945-10-04  Today's Date: 01/20/2021 OT Individual Time: 1000-1100 OT Individual Time Calculation (min): 60 min    Short Term Goals: Week 2:  OT Short Term Goal 1 (Week 2): Pt will complete LB dressing with min assist using hemi techniques with min VCs. OT Short Term Goal 2 (Week 2): Pt will complete self feeding with intermittent supervision using AE as needed. OT Short Term Goal 3 (Week 2): Pt will complete toileting 3/3 with min assist bowel and bladder. OT Short Term Goal 4 (Week 2): Pt will be able to scan sink counter top and retrieve needed ADL items for oral hygiene with min cues.  Skilled Therapeutic Interventions/Progress Updates:    Pt sitting up in w/c, no c/o pain, requesting to shave with OT. Cleared by MD for shaving. Pt setup with needed items at sink and required assist to open shaving cream and cap on razor.  Pt shaved right side of face thoroughly and then stated he was finished (leaving left side of face unshaved).  Needed max multimodal cues to attend to left side of face and pt stating he cannot see left side well.  Educated pt on using tactile feedback by feeling left side of face first, however pt having difficulty attending and returning to shaving right side of face again.  Needed min assist overall to complete fully.  Pt transported to ortho gym and left walker splint strap reinforced with wider strapping and new adhesive to increase support.  Pt completed sit to stand, ambulation of 5 feet, turning with obstacle on left, and returning to w/c stand to sit using RW needing min assist.  Returned to room, call bell in reach, lap tray in place, seat alarm on.  Therapy Documentation Precautions:  Precautions Precautions: Fall Precaution Comments: L inattention; L hemi; post covid; recent R shoulder arthroscopy and debridement (12/17/20) Restrictions Weight Bearing  Restrictions: No    Therapy/Group: Individual Therapy  Ezekiel Slocumb 01/20/2021, 12:32 PM

## 2021-01-20 NOTE — Progress Notes (Signed)
Physical Therapy Weekly Progress Note  Patient Details  Name: Dalton Miller MRN: [id number] Date of Birth: 1945-09-02  Beginning of progress report period: January 13, 2021 End of progress report period: January 20, 2021  Today's Date: 01/20/2021 PT Individual Time: 1300-1445 PT Individual Time Calculation (min): 105 min   Patient has met 4 of 4 short term goals.  Pt making appropriate progress towards goals this reporting period. Overall, pt requires modA for bed mobility, minA for sit<>stand transfers, minA for bed<>chair transfers with RW, and minA for ambulating 150-272ft with RW. He is primarily limited by R gaze preference with significant L inattention, L hemibody weakness (LUE>LLE), decreased initiation and delayed processing, poor dynamic standing balance, and motor apraxia. Pt has been able to wean from 2L supplemental oxygen to tolerating room air while maintaining oxygen >95% with long distance gait. He has also improved his BERG balance score from 2/56 to 21/56 since date of evaluation. He continues to be highly motivated for progressing and is consistently participating in therapy sessions.   Patient continues to demonstrate the following deficits muscle weakness, decreased cardiorespiratoy endurance, motor apraxia and decreased motor planning, decreased visual perceptual skills, decreased initiation, decreased attention, decreased awareness, decreased problem solving, decreased safety awareness, and delayed processing, and decreased standing balance, decreased postural control, hemiplegia, and decreased balance strategies and therefore will continue to benefit from skilled PT intervention to increase functional independence with mobility.  Patient progressing toward long term goals..  Continue plan of care. Goals upgraded to CGA level overall. See POC note for details.   PT Short Term Goals Week 2:  PT Short Term Goal 1 (Week 2): STG = LTG due ELOS  Skilled Therapeutic  Interventions/Progress Updates:      Pt sitting in w/c to start session. Agreeable to PT tx. Reports some mild R anterior thigh pain - treatment to tolerance with rest breaks, repositioning, and mobility provided for pain management. Transported to [facility] rehab gym and assisted to Nustep with minA stand<>pivot transfer. Required setupA on Nustep for placement of LE's onto paddles. Completed 86min + 3 min (brief rest break) on Nustep with workload set to 3, using BLE only to isolate LE strengthening. Returned to w/c in similar stand<>pivot transfer with directional and instructional cues needed for sequencing. Transported downstairs to 38M rehab gym for time and energy conservation. NMR completed with BITS system to work on L visual scanning, problem solving, and standing balance/tolerance. User paced visual tracking with 4 sec reaction time with min cues needed to locate target on L side. CGA for standing balance while reaching outside BOS with LUE support to RW. Pt completed a 2nd time with parameters changed to random rather than isolated to L screen - reaction time 9.5 seconds compared to previous 4 seconds. Pt ed on lighthouse scanning for compensation and safety awareness which he voiced understanding. Pt completed BERG balance test with results outlined below. Pt scored 21/56 which is an 19 point improvement compared to date of evaluation where he scored 2/56. Falls risk educated to pt based on BERG performance.  Patient demonstrates increased fall risk as noted by score of 21/56 on Berg Balance Scale.  (<36= high risk for falls, close to 100%; 37-45 significant >80%; 46-51 moderate >50%; 52-55 lower >25%)  Pt wheeled to main rehab gym to practice stair training. He navigated up/down 4 + 4 (seated rest) with 1 hand rail on R with minA for ascent and modA for descent. Ed needed for stair sequencing (lead with  R foot for ascent and L foot for descent). Assist primarily needed for steadying due to fatigue and  weakness. Required VC for awareness of L foot placement to ensure full foot on step.   Pt returned upstairs to his room in w/c for time. Remained seated in w/c with safety belt alarm on, 1/2 lap tray supporting LUE, all immediate needs within reach.   Therapy Documentation Precautions:  Precautions Precautions: Fall Precaution Comments: L inattention; L hemi; post covid; recent R shoulder arthroscopy and debridement (12/17/20) Restrictions Weight Bearing Restrictions: No General:    Balance Balance Assessed: Yes Standardized Balance Assessment Standardized Balance Assessment: Berg Balance Test Berg Balance Test Sit to Stand: Needs minimal aid to stand or to stabilize Standing Unsupported: Able to stand 2 minutes with supervision Sitting with Back Unsupported but Feet Supported on Floor or Stool: Able to sit safely and securely 2 minutes Stand to Sit: Sits independently, has uncontrolled descent Transfers: Needs one person to assist Standing Unsupported with Eyes Closed: Able to stand 10 seconds with supervision Standing Ubsupported with Feet Together: Able to place feet together independently and stand for 1 minute with supervision From Standing, Reach Forward with Outstretched Arm: Reaches forward but needs supervision From Standing Position, Pick up Object from Floor: Able to pick up shoe, needs supervision From Standing Position, Turn to Look Behind Over each Shoulder: Needs supervision when turning Turn 360 Degrees: Needs assistance while turning Standing Unsupported, Alternately Place Feet on Step/Stool: Needs assistance to keep from falling or unable to try Standing Unsupported, One Foot in Front: Loses balance while stepping or standing Standing on One Leg: Unable to try or needs assist to prevent fall Total Score: 21/56  Therapy/Group: Individual Therapy  Jaevion Goto P Sharod Petsch 01/20/2021, 7:24 AM

## 2021-01-20 NOTE — Patient Care Conference (Signed)
Inpatient RehabilitationTeam Conference and Plan of Care Update Date: 01/20/2021   Time: 11:24 AM    Patient Name: Dalton Miller      Medical Record Number: [id number]  Date of Birth: 1945/11/09 Sex: Male         Room/Bed: 5C20C/5C20C-01 Payor Info: Payor: [employer] MEDICARE / Plan: [employer] MEDICARE CHOICE PPO / Product Type: *No Product type* /    Admit Date/Time:  01/12/2021  2:22 PM  Primary Diagnosis:  Right middle cerebral artery stroke [facility])  Hospital Problems: Principal Problem:   Right middle cerebral artery stroke [facility])    Expected Discharge Date: Expected Discharge Date: 01/29/21  Team Members Present: Physician leading conference: Dr. Leeroy Cha Social Worker Present: Ovidio Kin, LCSW Nurse Present: Dorien Chihuahua, RN PT Present: Ginnie Smart, PT OT Present: Leretha Pol, OT SLP Present: Sherren Kerns, SLP PPS Coordinator present : Gunnar Fusi, SLP     Current Status/Progress Goal Weekly Team Focus  Bowel/Bladder   Incontinent of B/B. LBM 01/19/21  Regain continence.      Swallow/Nutrition/ Hydration   regular textures and thin liquids via provale cup  Supervision A  swallow compensatory strategies and incraesing liquid amount   ADL's   min assist functional transfers with RW, min assist toileting and bathing, supervision UB dressing, mod assist LB dressing  min assist  self care training with hemi techniques, visual scanning and left attention, LUE neuro re-ed and functional use, functional transfer training   Mobility   min/modA bed mobility, minA sit<>stand and bed<>chair transfers, minA gait ~154ft with RW. Tolerating sessions on room air, maintianing >93% oxygen. Continues to have strong R gaze preference, L inattention, L hemi weakness (LUE>LLE), gait impairments, and poor activity tolerance (although this is improving)  Upgraded on 1/18 to CGA overall  LLE NMR, gait training, dynamic standing balance, activity tolerance, DC planning. Schedule family  education with daughter in law   Communication             Safety/Cognition/ Behavioral Observations  Max-Mod A  Min A  sustaiend attention/alertness, left scanning, problem solving, recall and error awareness   Pain   Denies Pain.  Will remain pain free      Skin   Excoriated Buttocks, Gerhardts cream Q4  Buttocks will geal. No further skin breakdown.        Discharge Planning:  HOme with son and daughte rin-law assisting along with his two other children. Aware pt will require 24/7 care. Daughter in-law has been in for therapies to observe   Team Discussion: Patient sleeping better after melatonin added. Note edema of extremities; discussed treatment options. Successfully weaned off O2; tolerating therapy without oxygen. Urge incontinence addressed. Progress limited by left neglect and field cut; requires cues to scan for objects and use hemi techniques.  Patient on target to meet rehab goals: yes, currently needs supervision for upper body care and mod assist for lower body dressing. Needs min assist for sit-stand and gait up to 150', with mod assist with turns due to loss of balance. Requires mod - max assist for problem solving, awareness and attention with min assist goals set for discharge.   *See Care Plan and progress notes for long and short-term goals.   Revisions to Treatment Plan:  Upgraded goals to CGA with PT Working on neuro re-education and gait and dynamic balance  MBS 01/19/21; upgraded to regular consistency diet with thin liquids by provale cup Teaching Needs: Safety, medications, secondary risk management, toileting, transfers, etc.  Current Barriers to Discharge: Decreased caregiver support  Possible Resolutions to Barriers: Family education OP follow up services DME: [facility], has a shower bench and [facility]     Medical Summary Current Status: wrist pain improved, urge incontinence, overweight, insomnia, AKI, peripheral edema, left sided neglect and visual field  cut, COPD    Barriers to Discharge Comments: wrist pain improved, urge incontinence, overweight, insomnia, AKI, left sided neglect and visual field cut, COPD Possible Resolutions to [employer] Focus: continue ice/tylenol for wrist pain as needed, consider flomax, provide dietary education, continue melatonin, monitor creatinine as needed, add lasix 10mg  x1, wean off O2   Continued Need for Acute Rehabilitation Level of Care: The patient requires daily medical management by a physician with specialized training in physical medicine and rehabilitation for the following reasons: Direction of a multidisciplinary physical rehabilitation program to maximize functional independence : Yes Medical management of patient stability for increased activity during participation in an intensive rehabilitation regime.: Yes Analysis of laboratory values and/or radiology reports with any subsequent need for medication adjustment and/or medical intervention. : Yes   I attest that I was present, lead the team conference, and concur with the assessment and plan of the team.   Dorien Chihuahua B 01/20/2021, 8:27 PM

## 2021-01-20 NOTE — Progress Notes (Signed)
Speech Language Pathology Daily Session Note  Patient Details  Name: Dalton Miller MRN: [id number] Date of Birth: 1945/02/09  Today's Date: 01/20/2021 SLP Individual Time: 5397-6734 SLP Individual Time Calculation (min): 45 min  Short Term Goals: Week 2: SLP Short Term Goal 1 (Week 2): Pt will demonstrate alertness and sustained attention during functional task for 10-15 minutes with min A verbal cues in 75% of opportunties. SLP Short Term Goal 2 (Week 2): Pt will demonstrate basic to mildly complex problem solving skills in fucntional tasks with mod A verbal cues. SLP Short Term Goal 3 (Week 2): Pt will demonstrate self monitoring of functional errors in problem solving tasks with mod A multimodal cues. SLP Short Term Goal 4 (Week 2): Pt will demonstrate recall of daily and novel information with min A verbal cues to utilize compensatory aids. SLP Short Term Goal 5 (Week 2): Pt will scan left of midline in functional tasks to locate objects with 70% accuracy given mod A multimodal cues. SLP Short Term Goal 6 (Week 2): Pt will demonstarte use of swallow strategies on current diet reagular textures and thin liquids via provale cup with minimal overt s/s aspiration and min A verbal cues.  Skilled Therapeutic Interventions: Pt seen for skilled ST with focus on cognitive and swallowing goals, pt in bed and agreeable to therapeutic tasks. With verbal cues for recall, pt able to explain results of recent MBS and recommendations for swallow precautions. SLP facilitating mildly complex problem solving task by providing overall min A verbal cues for safety and accuracy. Pt demonstrating functional alertness and attention across 45 minute tx session. RN present for med pass, SLP providing education on use of Provale cup only for thin liquids at this time. Trialed patient with whole pills with liquid wash however pt continues to demonstrate significant difficulty with A-P transit and swallow initiation. Trialed  floating large pills in applesauce which did not improve swallow ability. Observed with small pills in applesauce with no difficulty. Continue recommendations for small pills whole in applesauce, crush large pills in applesauce. Pt left in bed with RN present. Cont ST POC.  Pain Pain Assessment Pain Scale: 0-10 Pain Score: 0-No pain  Therapy/Group: Individual Therapy  Dewaine Conger 01/20/2021, 9:08 AM

## 2021-01-20 NOTE — Plan of Care (Signed)
°  Problem: Sit to Stand Goal: LTG:  Patient will perform sit to stand with assistance level (PT) Description: LTG:  Patient will perform sit to stand with assistance level (PT) Flowsheets (Taken 01/20/2021 0731) LTG: PT will perform sit to stand in preparation for functional mobility with assistance level: Contact Guard/Touching assist Note: Upgraded due to progress   Problem: RH Bed to Chair Transfers Goal: LTG Patient will perform bed/chair transfers w/assist (PT) Description: LTG: Patient will perform bed to chair transfers with assistance (PT). Flowsheets (Taken 01/20/2021 0731) LTG: Pt will perform Bed to Chair Transfers with assistance level: Contact Guard/Touching assist Note: Upgraded due to progress   Problem: RH Ambulation Goal: LTG Patient will ambulate in controlled environment (PT) Description: LTG: Patient will ambulate in a controlled environment, # of feet with assistance (PT). Flowsheets (Taken 01/20/2021 0731) LTG: Pt will ambulate in controlled environ  assist needed:: Contact Guard/Touching assist LTG: Ambulation distance in controlled environment: 181ft Note: Upgraded due to progress Goal: LTG Patient will ambulate in home environment (PT) Description: LTG: Patient will ambulate in home environment, # of feet with assistance (PT). Flowsheets (Taken 01/20/2021 0731) LTG: Pt will ambulate in home environ  assist needed:: Contact Guard/Touching assist Note: Upgraded due to progress

## 2021-01-20 NOTE — Progress Notes (Signed)
Physical Therapy Session Note  Patient Details  Name: Dalton Miller MRN: [id number] Date of Birth: May 09, 1945  Today's Date: 01/20/2021 PT Individual Time: 0915-0955 PT Individual Time Calculation (min): 40 min   Short Term Goals: Week 1:  PT Short Term Goal 1 (Week 1): Pt will complete bed mobility with minA and no hospital bed features PT Short Term Goal 2 (Week 1): Pt will complete bed<>chair transfers with modA and LRAD PT Short Term Goal 3 (Week 1): Pt will ambulate 71f with modA and LRAD PT Short Term Goal 4 (Week 1): Pt will improve BERG score >7 points  Skilled Therapeutic Interventions/Progress Updates: Pt presented in bed agreeable to therapy. Pt denies pain throughout session. Pt performed supine to sit with CGA and use of bed features, with increased time. PTA threaded pants total A for time management then performed Sit to stand with minA and use of RW. PTA assisted in securing hand into orthotic and pt was able to pull pants over hips with minA. Pt then indicating need for bathroom. Pt ambulated with minA to toilet requiring min cues for RW placement and to note objects on L as RW hit door frame walking into bathroom. At toilet pt was able to assist pulling down pants however noted to be activity having BM therefore PTA assisted with clothing and pt transferred to toilet with minA (+urinary void/BM). Once competed pt was able to perform peri-care seated with supervision and PTA changed brief, pants, and socks total A for time management. Pt performed stand with CGA and PTA used washcloth to ensure cleanliness. Pt then ambulated to sink to perform hand hygiene with PTA assisting with LUE management to sink. Pt was able to complete hand hygiene in standing but requesting to sit once completed. Pt was able to scoot back in w/c with supervision and set up with half lap tray and belt alarm placed. Pt requesting water prior to PTA's leaving. When pt took first sip of water noted to immediately  start coughing with pt stating coughed when attempting to take second swallow (per protocol). Pt was able to successfully take x 2 more sips without coughing. Pt left in w/c at end of session with call bell within reach and needs met.      Therapy Documentation Precautions:  Precautions Precautions: Fall Precaution Comments: L inattention; L hemi; post covid; recent R shoulder arthroscopy and debridement (12/17/20) Restrictions Weight Bearing Restrictions: No General:   Vital Signs: Therapy Vitals BP: 136/76 Pain: Pain Assessment Pain Scale: 0-10 Pain Score: 0-No pain Mobility:   Locomotion :    Trunk/Postural Assessment :    Balance:   Exercises:   Other Treatments:      Therapy/Group: Individual Therapy  [name] 01/20/2021, 12:48 PM

## 2021-01-20 NOTE — Progress Notes (Signed)
PROGRESS NOTE   Subjective/Complaints: No new complaints this morning Wrist pain has resolved Tolerating therapy with sat >93% on room air Had MBS yesterday  ROS: denies CP, SOB, N/V, +peripheral neuropathy, wrist pain improved  Objective:   No results found. No results for input(s): WBC, HGB, HCT, PLT in the last 72 hours.  Recent Labs    01/18/21 1215  NA 142  K 3.9  CL 109  CO2 24  GLUCOSE 175*  BUN 12  CREATININE 1.17  CALCIUM 8.4*     Intake/Output Summary (Last 24 hours) at 01/20/2021 1126 Last data filed at 01/19/2021 1215 Gross per 24 hour  Intake 120 ml  Output --  Net 120 ml        Physical Exam: Vital Signs Blood pressure 136/76, pulse 79, temperature 98.2 F (36.8 C), resp. rate 17, height 5\' 7"  (1.702 m), weight 79.9 kg, SpO2 90 %. Gen: no distress, normal appearing HEENT: oral mucosa pink and moist, NCAT Cardio: Reg rate Chest: normal effort, normal rate of breathing Abd: soft, non-distended Ext: no edema Psych: pleasant, normal affect  Skin: intact Neurological:     Mental Status: He is alert.     Comments: Patient is alert.  Makes eye contact with examiner.  Provides name and age but limited medical historian. Normal language. Speech sl dysarthric. Follows simple commands. Left central 7 and tongue deviation. Right gaze preference.  LUE 2 to 2+/5. LLE 3 to 3+/5. RUE and RLE 4 to 4+/5. No obvious sensory deficits. No limb ataxia apparent. Normal resting tone and DTR's 1+.     Assessment/Plan: 1. Functional deficits which require 3+ hours per day of interdisciplinary therapy in a comprehensive inpatient rehab setting. Physiatrist is providing close team supervision and 24 hour management of active medical problems listed below. Physiatrist and rehab team continue to assess barriers to discharge/monitor patient progress toward functional and medical goals  Care Tool:  Bathing  Bathing  activity did not occur: Refused Body parts bathed by patient: Left arm, Chest, Abdomen, Face   Body parts bathed by helper: Right arm, Left lower leg, Right lower leg, Left upper leg, Right upper leg, Buttocks, Front perineal area     Bathing assist Assist Level: Maximal Assistance - Patient 24 - 49%     Upper Body Dressing/Undressing Upper body dressing Upper body dressing/undressing activity did not occur (including orthotics): Refused What is the patient wearing?: Pull over shirt    Upper body assist Assist Level: Moderate Assistance - Patient 50 - 74%    Lower Body Dressing/Undressing Lower body dressing    Lower body dressing activity did not occur: Refused What is the patient wearing?: Pants     Lower body assist Assist for lower body dressing: Maximal Assistance - Patient 25 - 49%     Toileting Toileting    Toileting assist Assist for toileting: Maximal Assistance - Patient 25 - 49%     Transfers Chair/bed transfer  Transfers assist  Chair/bed transfer activity did not occur: Safety/medical concerns  Chair/bed transfer assist level: Moderate Assistance - Patient 50 - 74% (squat/partial stand pivot)     Locomotion Ambulation   Ambulation assist   Ambulation  activity did not occur: Safety/medical concerns  Assist level: 2 helpers (mod A of 1 and +2 w/c follow) Assistive device: Walker-rolling (w/ hand orthosis) Max distance: 121ft   Walk 10 feet activity   Assist  Walk 10 feet activity did not occur: Safety/medical concerns  Assist level: Moderate Assistance - Patient - 50 - 74% Assistive device: Hand held assist, Walker-rolling   Walk 50 feet activity   Assist Walk 50 feet with 2 turns activity did not occur: Safety/medical concerns         Walk 150 feet activity   Assist Walk 150 feet activity did not occur: Safety/medical concerns         Walk 10 feet on uneven surface  activity   Assist Walk 10 feet on uneven surfaces  activity did not occur: Safety/medical concerns         Wheelchair     Assist Is the patient using a wheelchair?: Yes Type of Wheelchair: Manual Wheelchair activity did not occur:  (fatigue at end of session)  Wheelchair assist level: Dependent - Patient 0%      Wheelchair 50 feet with 2 turns activity    Assist        Assist Level: Dependent - Patient 0%   Wheelchair 150 feet activity     Assist      Assist Level: Dependent - Patient 0%   Blood pressure 136/76, pulse 79, temperature 98.2 F (36.8 C), resp. rate 17, height 5\' 7"  (1.702 m), weight 79.9 kg, SpO2 90 %.    Medical Problem List and Plan: 1. Functional deficits  disconjugate gaze with altered mental status/acute hepatic metabolic encephalopathy/multifactorial secondary to right MCA scattered infarcts             -patient may shower             -ELOS/Goals: 14-17 days, min assist goals with PT, OT, SLP  Continue CIR  Messaged April to schedule HFU 2.  Impaired mobility -DVT/anticoagulation:  Mechanical: Antiembolism stockings, thigh (TED hose) Bilateral lower extremities             -antiplatelet therapy: continue Aspirin 81 mg daily 3. Pain Management: Tylenol held due to elevated LFTs 4. Mood: Provide emotional support             -antipsychotic agents: N/A 5. Neuropsych: This patient is capable of making decisions on his own behalf. 6. Skin/Wound Care: Routine skin checks 7. Fluids/Electrolytes/Nutrition: Routine in and outs with follow-up chemistries  8.  Acute blood loss anemia due to upper GI bleed esophageal varices.  Status post banding x4 12/30/2020.   -continue Protonix twice daily.   CBC Latest Ref Rng & Units 01/13/2021 01/10/2021 01/08/2021  WBC 4.0 - 10.5 K/uL 11.8(H) 11.5(H) 12.0(H)  Hemoglobin 13.0 - 17.0 g/dL 9.9(L) 8.5(L) 9.5(L)  Hematocrit 39.0 - 52.0 % 31.4(L) 26.6(L) 30.8(L)  Platelets 150 - 400 K/uL 143(L) 178 224    -Follow-up per GI services 9.  Acute hypoxic  respiratory failure.  Extubated 01/06/2020. Discussed importance of using incentive spirometer. Wean off O2.  10.  Metastatic recurrent melanoma/left upper lobe lesion, mediastinal adenopathy.  Status post 4 cycles ipo.nivo.  Follow-up outpatient             -currently on oxygen. Wean to off as able 10.  COVID-19 infection.  Appeared incidental.  Isolation completed. 11.  Recurrent low-grade urothelial carcinoma.  Status post TURBT x3.  Follow-up outpatient.   12.  Elevated LFTs/hepatic cirrhosis.  Markedly improved.  Continue lactulose.  Holding Lasix/Spiro due to AKI. Check LFTs today.  13.  AKI.  Status post solitary kidney status post nephrectomy due to [place]. Encouraged 6-8 glasses of water per day. Check Cr today BMP Latest Ref Rng & Units 01/18/2021 01/13/2021 01/10/2021  Glucose 70 - 99 mg/dL 175(H) 126(H) 131(H)  BUN 8 - 23 mg/dL 12 20 28(H)  Creatinine 0.61 - 1.24 mg/dL 1.17 1.38(H) 1.60(H)  BUN/Creat Ratio 10 - 24 - - -  Sodium 135 - 145 mmol/L 142 145 145  Potassium 3.5 - 5.1 mmol/L 3.9 4.2 3.7  Chloride 98 - 111 mmol/L 109 115(H) 116(H)  CO2 22 - 32 mmol/L 24 22 20(L)  Calcium 8.9 - 10.3 mg/dL 8.4(L) 7.7(L) 7.7(L)   Improving  14.  Diabetes mellitus.  Hemoglobin A1c 7.6.  SSI.            CBG (last 3)  Recent Labs    01/19/21 1643 01/19/21 2037 01/20/21 0542  GLUCAP 170* 129* 106*  Commended on improvements in CBG control.   15.  Hypertension.  Well controlled, continue Norvasc 10 mg daily.  Monitor with increased mobility Vitals:   01/20/21 0424 01/20/21 0902  BP: 138/75 136/76  Pulse: 79   Resp: 17   Temp: 98.2 F (36.8 C)   SpO2: 90%     16.  Hyperlipidemia.  Lipitor resumed 10 mg daily 17. Obesity BMI 30.25-->28.80: provided dietary education. 18. Diarrhea: decrease lactulose to every other day  19. Mild oropharyngeal dysphagia: continue SLP 20. Lowe extremity edema: lasix 10mg  on 1/18.     LOS: 8 days A FACE TO FACE EVALUATION WAS PERFORMED  Martha Clan P  Maanvi Lecompte 01/20/2021, 11:26 AM

## 2021-01-21 ENCOUNTER — Encounter (HOSPITAL_COMMUNITY): Payer: No Typology Code available for payment source | Admitting: Occupational Therapy

## 2021-01-21 LAB — BASIC METABOLIC PANEL
Anion gap: 14 (ref 5–15)
BUN: 13 mg/dL (ref 8–23)
CO2: 24 mmol/L (ref 22–32)
Calcium: 8.6 mg/dL — ABNORMAL LOW (ref 8.9–10.3)
Chloride: 105 mmol/L (ref 98–111)
Creatinine, Ser: 1.23 mg/dL (ref 0.61–1.24)
GFR, Estimated: >60 mL/min (ref >60)
Glucose, Bld: 149 mg/dL — ABNORMAL HIGH (ref 70–99)
Potassium: 3.8 mmol/L (ref 3.5–5.1)
Sodium: 143 mmol/L (ref 135–145)

## 2021-01-21 LAB — GLUCOSE, CAPILLARY
Glucose-Capillary: 104 mg/dL — ABNORMAL HIGH (ref 70–99)
Glucose-Capillary: 134 mg/dL — ABNORMAL HIGH (ref 70–99)
Glucose-Capillary: 115 mg/dL — ABNORMAL HIGH (ref 70–99)
Glucose-Capillary: 129 mg/dL — ABNORMAL HIGH (ref 70–99)

## 2021-01-21 NOTE — Progress Notes (Signed)
Occupational Therapy Session Note  Patient Details  Name: Dalton Miller MRN: [id number] Date of Birth: 07-12-1945  Today's Date: 01/21/2021 OT Individual Time: 1018-1130 OT Individual Time Calculation (min): 72 min    Short Term Goals: Week 2:  OT Short Term Goal 1 (Week 2): Pt will complete LB dressing with min assist using hemi techniques with min VCs. OT Short Term Goal 2 (Week 2): Pt will complete self feeding with intermittent supervision using AE as needed. OT Short Term Goal 3 (Week 2): Pt will complete toileting 3/3 with min assist bowel and bladder. OT Short Term Goal 4 (Week 2): Pt will be able to scan sink counter top and retrieve needed ADL items for oral hygiene with min cues.  Skilled Therapeutic Interventions/Progress Updates:    Pt sitting up in w/c, no c/o pain, agreeable to OT session.  Pt transported to ortho gym and completed LUE neuro re-ed and strengthening using UBE with ace wrap securing left hand grasp to handle.  Pt completed 5 minutes forward and 5 minutes backward.  Pt needing frequent cues to attend to LUE during UBE to increase active use and performance.  Pt then participated in left visual scanning and pursuits positioned at a 45 degree angle toward the right in relation to BITS screen to promote left head turn and attention.  Pt required max and frequent cues to attend to left side to successfully locate moving target.  Pt verbalized improved awareness of left inattention and field cut stating "Im having a really hard time looking to my left side".  Pt educated on "lighthouse" technique in order to increase visual feedback from left environment and pt then attempted to locate as many doors as he can during transport back to room.  Pt was able to identify 13/16 doors with therapist reducing speed of transport to provide extra time to scan and locate.  Pt requesting back to bed at end of session.  Stand pivot to EOB using RW with CGA.  Sit to supine with min assist.  Call  bell in reach, bed alarm on.  Therapy Documentation Precautions:  Precautions Precautions: Fall Precaution Comments: L inattention; L hemi; post covid; recent R shoulder arthroscopy and debridement (12/17/20) Restrictions Weight Bearing Restrictions: No  Therapy/Group: Individual Therapy  Ezekiel Slocumb 01/21/2021, 11:53 AM

## 2021-01-21 NOTE — Progress Notes (Signed)
Occupational Therapy Session Note  Patient Details  Name: Dalton Miller MRN: [id number] Date of Birth: 1946/01/01  Today's Date: 01/21/2021 OT Individual Time: 4166-0630 OT Individual Time Calculation (min): 87 min    Short Term Goals: Week 2:  OT Short Term Goal 1 (Week 2): Pt will complete LB dressing with min assist using hemi techniques with min VCs. OT Short Term Goal 2 (Week 2): Pt will complete self feeding with intermittent supervision using AE as needed. OT Short Term Goal 3 (Week 2): Pt will complete toileting 3/3 with min assist bowel and bladder. OT Short Term Goal 4 (Week 2): Pt will be able to scan sink counter top and retrieve needed ADL items for oral hygiene with min cues.  Skilled Therapeutic Interventions/Progress Updates:    Pt in bed to start session with completion of supine to sit on the right side of the bed at min assist.  He then completed stand pivot transfer to the wheelchair at min assist level and was taken down to the therapy gym for session.  Min assist for transfer to the mat stand pivot and for transition into supine.  Therapist complete light stretching of posterior right shoulder capsule with focus on shoulder flexion with external rotation ROM secondary to recent rotator cuff surgery per pt.  Next, provided medium sized therapy ball for pt work on holding with BUEs in supine with focus on shoulder flexion exercises with elbow extension bilaterally to transition ball above his head and then down toward his feet.  Completed several repetitions with min assist for sustained activation on the left and maintaining palm in contact with the ball.  Next, had him use a light resistance therapy band looped with pt instructed to place his hands inside of the band and push out with shoulders flexed to 90 degrees, emphasizing some firing of the external rotators and abductors while completing small movements of shoulder flexion.  Min to mod assist needed to help facilitate this  movement with the LUE.  Transitioned to sitting where he also worked on transitions from left forearm on the mat to sitting with min assist and emphasis on elbow extension.  Completed functional reaching at knee level for 2"X4" foam blocks with mod facilitation for grasp and release with the LUE. Had him grasp them from the chair and bring to mat beside of him to place them.  Finished mat work with use of the [employer] with focus on shoulder flexion with elbow extension and external rotation to move hand to target given.  He was able to complete with min guard assist and min instructional cueing for sustained visual attention to the left side.  Returned to the wheelchair at min assist stand pivot and then to the EOB.  Pt with report of wanting to use the urinal, so worked in standing for this with mod assist for balance, clothing management, and holding the urinal.  Pt with bowel incontinence as well while standing to use the urinal.  He was able to complete toilet hygiene with min assist but needed mod assist to donn new brief and pull up pants.  He washed his hands with setup when presented with a washcloth with transition to supine at min assist level for lifting and positioning the LLE into the bed.  Pt left with call button in reach and bed alarm in place.  Encouraged work on gross digit flexion and extension AAROM of the left hand.    Therapy Documentation Precautions:  Precautions Precautions:  Fall Precaution Comments: L inattention; L hemi; post covid; recent R shoulder arthroscopy and debridement (12/17/20) Restrictions Weight Bearing Restrictions: No  Pain: Pain Assessment Pain Scale: Faces Pain Score: 0-No pain ADL: See Care Tool Section for some details of mobility and selfcare   Therapy/Group: Individual Therapy  Regan Llorente OTR/L 01/21/2021, 4:16 PM

## 2021-01-21 NOTE — Progress Notes (Signed)
PROGRESS NOTE   Subjective/Complaints: No new complaints this morning except for mild anterior thigh pain- kpad ordered.  Ambulated 12 feet with [place] today!  ROS: denies CP, SOB, N/V, +peripheral neuropathy, wrist pain improved, +thigh pain  Objective:   No results found. No results for input(s): WBC, HGB, HCT, PLT in the last 72 hours.  No results for input(s): NA, K, CL, CO2, GLUCOSE, BUN, CREATININE, CALCIUM in the last 72 hours.    Intake/Output Summary (Last 24 hours) at 01/21/2021 1303 Last data filed at 01/21/2021 0829 Gross per 24 hour  Intake 240 ml  Output --  Net 240 ml        Physical Exam: Vital Signs Blood pressure 128/72, pulse 87, temperature 98.4 F (36.9 C), temperature source Oral, resp. rate 18, height 5\' 7"  (1.702 m), weight 80.3 kg, SpO2 93 %. Gen: no distress, normal appearing HEENT: oral mucosa pink and moist, NCAT Cardio: Reg rate Chest: normal effort, normal rate of breathing Abd: soft, non-distended Ext: no edema Psych: pleasant, normal affect   Skin: intact Neurological:     Mental Status: He is alert.     Comments: Patient is alert.  Makes eye contact with examiner.  Provides name and age but limited medical historian. Normal language. Speech sl dysarthric. Follows simple commands. Left central 7 and tongue deviation. Right gaze preference.  LUE 2 to 2+/5. LLE 3 to 3+/5. RUE and RLE 4 to 4+/5. No obvious sensory deficits. No limb ataxia apparent. Normal resting tone and DTR's 1+.     Assessment/Plan: 1. Functional deficits which require 3+ hours per day of interdisciplinary therapy in a comprehensive inpatient rehab setting. Physiatrist is providing close team supervision and 24 hour management of active medical problems listed below. Physiatrist and rehab team continue to assess barriers to discharge/monitor patient progress toward functional and medical goals  Care  Tool:  Bathing  Bathing activity did not occur: Refused Body parts bathed by patient: Left arm, Chest, Abdomen, Face   Body parts bathed by helper: Right arm, Left lower leg, Right lower leg, Left upper leg, Right upper leg, Buttocks, Front perineal area     Bathing assist Assist Level: Maximal Assistance - Patient 24 - 49%     Upper Body Dressing/Undressing Upper body dressing Upper body dressing/undressing activity did not occur (including orthotics): Refused What is the patient wearing?: Pull over shirt    Upper body assist Assist Level: Moderate Assistance - Patient 50 - 74%    Lower Body Dressing/Undressing Lower body dressing    Lower body dressing activity did not occur: Refused What is the patient wearing?: Pants     Lower body assist Assist for lower body dressing: Maximal Assistance - Patient 25 - 49%     Toileting Toileting    Toileting assist Assist for toileting: Maximal Assistance - Patient 25 - 49%     Transfers Chair/bed transfer  Transfers assist  Chair/bed transfer activity did not occur: Safety/medical concerns  Chair/bed transfer assist level: Minimal Assistance - Patient > 75%     Locomotion Ambulation   Ambulation assist   Ambulation activity did not occur: Safety/medical concerns  Assist level: Minimal Assistance - Patient >  75% Assistive device: Walker-rolling Max distance: 266ft   Walk 10 feet activity   Assist  Walk 10 feet activity did not occur: Safety/medical concerns  Assist level: Minimal Assistance - Patient > 75% Assistive device: Walker-rolling   Walk 50 feet activity   Assist Walk 50 feet with 2 turns activity did not occur: Safety/medical concerns  Assist level: Minimal Assistance - Patient > 75% Assistive device: Walker-rolling    Walk 150 feet activity   Assist Walk 150 feet activity did not occur: Safety/medical concerns  Assist level: Minimal Assistance - Patient > 75% Assistive device:  Walker-rolling    Walk 10 feet on uneven surface  activity   Assist Walk 10 feet on uneven surfaces activity did not occur: Safety/medical concerns         Wheelchair     Assist Is the patient using a wheelchair?: Yes Type of Wheelchair: Manual Wheelchair activity did not occur:  (fatigue at end of session)  Wheelchair assist level: Dependent - Patient 0%      Wheelchair 50 feet with 2 turns activity    Assist        Assist Level: Dependent - Patient 0%   Wheelchair 150 feet activity     Assist      Assist Level: Dependent - Patient 0%   Blood pressure 128/72, pulse 87, temperature 98.4 F (36.9 C), temperature source Oral, resp. rate 18, height 5\' 7"  (1.702 m), weight 80.3 kg, SpO2 93 %.    Medical Problem List and Plan: 1. Functional deficits  disconjugate gaze with altered mental status/acute hepatic metabolic encephalopathy/multifactorial secondary to right MCA scattered infarcts             -patient may shower             -ELOS/Goals: 14-17 days, min assist goals with PT, OT, SLP  Continue CIR  Messaged April to schedule HFU 2.  Impaired mobility -DVT/anticoagulation:  Mechanical: Antiembolism stockings, thigh (TED hose) Bilateral lower extremities             -antiplatelet therapy: continue Aspirin 81 mg daily 3. Thigh pain: add kpad. Tylenol held due to elevated LFTs 4. Mood: Provide emotional support             -antipsychotic agents: N/A 5. Neuropsych: This patient is capable of making decisions on his own behalf. 6. Skin/Wound Care: Routine skin checks 7. Fluids/Electrolytes/Nutrition: Routine in and outs with follow-up chemistries  8.  Acute blood loss anemia due to upper GI bleed esophageal varices.  Status post banding x4 12/30/2020.   -continue Protonix twice daily.   CBC Latest Ref Rng & Units 01/13/2021 01/10/2021 01/08/2021  WBC 4.0 - 10.5 K/uL 11.8(H) 11.5(H) 12.0(H)  Hemoglobin 13.0 - 17.0 g/dL 9.9(L) 8.5(L) 9.5(L)  Hematocrit  39.0 - 52.0 % 31.4(L) 26.6(L) 30.8(L)  Platelets 150 - 400 K/uL 143(L) 178 224    -Follow-up per GI services 9.  Acute hypoxic respiratory failure.  Extubated 01/06/2020. Discussed importance of using incentive spirometer. Wean off O2.  10.  Metastatic recurrent melanoma/left upper lobe lesion, mediastinal adenopathy.  Status post 4 cycles ipo.nivo.  Follow-up outpatient             -currently on oxygen. Wean to off as able 10.  COVID-19 infection.  Appeared incidental.  Isolation completed. 11.  Recurrent low-grade urothelial carcinoma.  Status post TURBT x3.  Follow-up outpatient.   12.  Elevated LFTs/hepatic cirrhosis.  Markedly improved.  Continue lactulose.  Holding Lasix/Spiro due to  AKI. Check LFTs today.  13.  AKI.  Status post solitary kidney status post nephrectomy due to [place]. Encouraged 6-8 glasses of water per day. Check Cr today BMP Latest Ref Rng & Units 01/18/2021 01/13/2021 01/10/2021  Glucose 70 - 99 mg/dL 175(H) 126(H) 131(H)  BUN 8 - 23 mg/dL 12 20 28(H)  Creatinine 0.61 - 1.24 mg/dL 1.17 1.38(H) 1.60(H)  BUN/Creat Ratio 10 - 24 - - -  Sodium 135 - 145 mmol/L 142 145 145  Potassium 3.5 - 5.1 mmol/L 3.9 4.2 3.7  Chloride 98 - 111 mmol/L 109 115(H) 116(H)  CO2 22 - 32 mmol/L 24 22 20(L)  Calcium 8.9 - 10.3 mg/dL 8.4(L) 7.7(L) 7.7(L)   Improving  14.  Diabetes mellitus.  Hemoglobin A1c 7.6.  SSI.            CBG (last 3)  Recent Labs    01/20/21 2050 01/21/21 0558 01/21/21 1136  GLUCAP 218* 104* 134*  Commended on improvements in CBG control.   15.  Hypertension.  Well controlled, continue Norvasc 10 mg daily.  Monitor with increased mobility Vitals:   01/20/21 1917 01/21/21 0437  BP: (!) 116/93 128/72  Pulse: 98 87  Resp: 18 18  Temp: 98 F (36.7 C) 98.4 F (36.9 C)  SpO2: 97% 93%    16.  Hyperlipidemia.  Lipitor resumed 10 mg daily 17. Obesity BMI 30.25-->28.80: provided dietary education. 18. Diarrhea: decrease lactulose to every other day  19. Mild  oropharyngeal dysphagia: continue SLP 20. [place] extremity edema: lasix 10mg  on 1/18. Repeat creatinine today. 21. Diabetic peripheral neuropathy: discussed [facility] outpatient.     LOS: 9 days A FACE TO FACE EVALUATION WAS PERFORMED  Yazmine Sorey P Natika Geyer 01/21/2021, 1:03 PM

## 2021-01-21 NOTE — Progress Notes (Signed)
Physical Therapy Session Note  Patient Details  Name: Dalton Miller MRN: [id number] Date of Birth: 09/16/1945  Today's Date: 01/21/2021 PT Individual Time: 0800-0900 PT Individual Time Calculation (min): 60 min   Short Term Goals: Week 2:  PT Short Term Goal 1 (Week 2): STG = LTG due ELOS  Skilled Therapeutic Interventions/Progress Updates:      Pt presents supine in bed, sleeping but awakens to voice. He's agreeable to PT tx and reports some mild anterior thigh pain on R. Treatment to tolerance with rest breaks, mobility, and distraction provided for pain management. Supine<>sit required modA for trunk elevation and CGA for initial sitting balance. Donned knee high TED"s and hospital socks with totalA for time. Required modA for LB dressing with difficulty pulling over hips in standing due to LUE weakness and posterior lean in standing. Completed stand<>pivot transfer with minA and no AD from EOB to w/c with VC needed for sequencing, technique, and RUE arm placement.   Pt transported downstairs in w/c to main rehab gym for time and energy conservation - assisted to mat table in similar transfer as above. Pt c/o feeling "weak" in the legs this morning - completed 2x5 sit<>stands from low mat table with CGA and no AD with rest break b/w sets. Education on energy conservation and time of the day impacting fatigue levels (for him, seems to more in the morning the afternoon). Instructed in gait training where he ambulated ~63ft ~63ft with minA and RW - assist needed for managing RW due to veering L and difficulty keeping LUE hand within walker splint. Walker splint with slide off due to decreased grasp and him relying on shoulder strength for manipulating RW. He also c/o thumb pain that was rubbing against RW splint. Used coban to wrap along edge of splint to provide barrier and also used Dycem grip under splint to prevent from slipping during gait. With adjustments to RW, pt demonstrating improved control  of RW with less discomfort in the hand.   Pt then instructed in repeated step-ups to 6inch step using R hand rail for support - completed 2x5 times with minA for balance - required VC for "lifting" paretic LUE rather than sliding it while stepping backwards off of step, responding well to verbal cueing.   Pt transported back upstairs to his room for time. Pt agreeable to remain seated in w/c with safety belt alarm on, 1/2 lap tray supporting LUE, all immediate needs within reach.   Therapy Documentation Precautions:  Precautions Precautions: Fall Precaution Comments: L inattention; L hemi; post covid; recent R shoulder arthroscopy and debridement (12/17/20) Restrictions Weight Bearing Restrictions: No General:    Therapy/Group: Individual Therapy  [name] 01/21/2021, 7:38 AM

## 2021-01-22 ENCOUNTER — Inpatient Hospital Stay (HOSPITAL_COMMUNITY): Payer: No Typology Code available for payment source

## 2021-01-22 DIAGNOSIS — M7989 Other specified soft tissue disorders: Secondary | ICD-10-CM

## 2021-01-22 DIAGNOSIS — R609 Edema, unspecified: Secondary | ICD-10-CM

## 2021-01-22 LAB — GLUCOSE, CAPILLARY
Glucose-Capillary: 110 mg/dL — ABNORMAL HIGH (ref 70–99)
Glucose-Capillary: 157 mg/dL — ABNORMAL HIGH (ref 70–99)
Glucose-Capillary: 85 mg/dL (ref 70–99)
Glucose-Capillary: 154 mg/dL — ABNORMAL HIGH (ref 70–99)

## 2021-01-22 MED ORDER — CHLORHEXIDINE GLUCONATE CLOTH 2 % EX PADS: 6.0000 | MEDICATED_PAD | Freq: Two times a day (BID) | CUTANEOUS | Status: DC

## 2021-01-22 NOTE — Progress Notes (Signed)
Speech Language Pathology Daily Session Note  Patient Details  Name: Dalton Miller MRN: [id number] Date of Birth: 08/12/1945  Today's Date: 01/22/2021 SLP Individual Time: 0800-0900 SLP Individual Time Calculation (min): 60 min  Short Term Goals: Week 2: SLP Short Term Goal 1 (Week 2): Pt will demonstrate alertness and sustained attention during functional task for 10-15 minutes with min A verbal cues in 75% of opportunties. SLP Short Term Goal 2 (Week 2): Pt will demonstrate basic to mildly complex problem solving skills in fucntional tasks with mod A verbal cues. SLP Short Term Goal 3 (Week 2): Pt will demonstrate self monitoring of functional errors in problem solving tasks with mod A multimodal cues. SLP Short Term Goal 4 (Week 2): Pt will demonstrate recall of daily and novel information with min A verbal cues to utilize compensatory aids. SLP Short Term Goal 5 (Week 2): Pt will scan left of midline in functional tasks to locate objects with 70% accuracy given mod A multimodal cues. SLP Short Term Goal 6 (Week 2): Pt will demonstarte use of swallow strategies on current diet reagular textures and thin liquids via provale cup with minimal overt s/s aspiration and min A verbal cues.  Skilled Therapeutic Interventions: Skilled ST treatment focused on cognitive and swallowing goals. Pt independently recalled reasoning for using provale cup. He consumed thin liquid sips using provale cup without overt s/sx of aspiration. SLP facilitated sustained, alternating attention, and working memory skills through generating names of animals for each letter of the alphabet. Pt benefited from sup A verbal cues for recall and increased processing time as task progressed. Patient required min A verbal cues for problem solving with medication scenarios and recall of current medications. Pt initially expressed "I don't need to work on that because I remember all my medications" however only able to report on one  medication this date. Pt agreeable it would be beneficial to have medications recorded on a list to support recall and understanding of current meds. SLP assisted by writing medication name/purpose/frequency and providing to patient. He was encouraged to speak with his nurse regarding medications during med passes for an opportunity to address any questions and increase familiarity. Pt in agreement. Patient was left in bed with alarm activated and immediate needs within reach at end of session. Continue per current plan of care.      Pain Pain Assessment Pain Scale: 0-10 Pain Score: 0-No pain  Therapy/Group: Individual Therapy  Patty Sermons 01/22/2021, 8:55 AM

## 2021-01-22 NOTE — Progress Notes (Signed)
Patient ID: Dalton Miller, male   DOB: [birth year], 76 y.o.   MRN: [id number] Met with the patient to review rehab process, and plan of care. Discussed current situation, secondary risks including DM (A1C 7.6), HTN, HLD (Trig 319) and medications and dietary modifications recommended.  Patient noted he was feeling much better today than previously. Feels like he will be ready for discharge as scheduled. Edema of lower extremities decreased; noted MD to test circulation. Reviewed skin care and smoking cessation with treatment options and patient noted he had tried patches in the past and medications. Refused lactulose for cirrhosis. Continue to follow along to discharge to address educational needs and facilitate preparation for discharge home. Margarito Liner

## 2021-01-22 NOTE — Progress Notes (Signed)
Lower extremity venous has been completed.   Preliminary results in CV Proc.   Dalton Miller 01/22/2021 3:54 PM

## 2021-01-22 NOTE — Progress Notes (Signed)
Occupational Therapy Session Note  Patient Details  Name: Dalton Miller MRN: [id number] Date of Birth: [birth year]  Today's Date: 01/22/2021 OT Individual Time: 1330-1445 OT Individual Time Calculation (min): 75 min    Short Term Goals: Week 2:  OT Short Term Goal 1 (Week 2): Pt will complete LB dressing with min assist using hemi techniques with min VCs. OT Short Term Goal 2 (Week 2): Pt will complete self feeding with intermittent supervision using AE as needed. OT Short Term Goal 3 (Week 2): Pt will complete toileting 3/3 with min assist bowel and bladder. OT Short Term Goal 4 (Week 2): Pt will be able to scan sink counter top and retrieve needed ADL items for oral hygiene with min cues.  Skilled Therapeutic Interventions/Progress Updates:    Pt sitting up in w/c, no c/o pain, reporting he doesn't really feel like going to the gym, he wants to take a shower.  Checked with nurse regarding PICC line removal and nurse reports she will confirm from MD if it can be removed.  Pt also expressing frustration with left hand not working.  Attended NMES to left FPL (IP thumb flexion) and then thumb abductors with functional component of grasping shaving cream bottle and releasing.  Used the following settings: 35pps, 284 pulse width, 4sec ramp on/off, 1:5 on/off time ratio.  No pain reported and skin intact prior/after application.    Pt then transported to adl bathroom and educated on shower bench transfer at tub shower level and pt return demonstrated with CGA using stand pivot w/c<>shower bench.  Pt returned to room and requested to use bathroom.  Pt ambulated using RW and 3 in 1 commode transfer with CGA.    Pt had continent episode of bowel and bladder (see flowsheet) however OT noted pt already had soiled brief of urine (pt reports his brief is dry and could not feel that it was soiled).  Max assist to doff/donn brief and min assist to pull pants/brief up over hips.  Ambulated to EOB with cues needed  to attend to doorway frame on left and CGA for balance.  Stand to sit with supervision and sit to supine with min assist.  Instructed pt in AROM cervical rotation in supine providing pt with visual target to increase left rotation and attention.  Instructed pt to complete x 20 reps per day for HEP.  Call bell in reach, bed alarm on.  Therapy Documentation Precautions:  Precautions Precautions: Fall Precaution Comments: L inattention; L hemi; post covid; recent R shoulder arthroscopy and debridement (12/17/20) Restrictions Weight Bearing Restrictions: No    Therapy/Group: Individual Therapy  Ezekiel Slocumb 01/22/2021, 2:56 PM

## 2021-01-22 NOTE — Progress Notes (Signed)
PROGRESS NOTE   Subjective/Complaints: No new complaints today.  Denies pain No more wrist pain, but notes lack of motor control Swelling in leg not bothersome to him  ROS: denies CP, SOB, N/V, +peripheral neuropathy, wrist pain improved, +thigh pain, +left lower extremity swelling  Objective:   No results found. No results for input(s): WBC, HGB, HCT, PLT in the last 72 hours.  Recent Labs    01/21/21 1503  NA 143  K 3.8  CL 105  CO2 24  GLUCOSE 149*  BUN 13  CREATININE 1.23  CALCIUM 8.6*      Intake/Output Summary (Last 24 hours) at 01/22/2021 1246 Last data filed at 01/22/2021 1244 Gross per 24 hour  Intake 600 ml  Output 200 ml  Net 400 ml        Physical Exam: Vital Signs Blood pressure 124/70, pulse 88, temperature (!) 97.4 F (36.3 C), temperature source Oral, resp. rate 17, height 5\' 7"  (1.702 m), weight 76.6 kg, SpO2 96 %. Gen: no distress, normal appearing HEENT: oral mucosa pink and moist, NCAT Cardio: Reg rate Chest: normal effort, normal rate of breathing Abd: soft, non-distended Ext: no edema Psych: pleasant, normal affect  Skin: intact Neurological:     Mental Status: He is alert.     Comments: Patient is alert.  Makes eye contact with examiner.  Provides name and age but limited medical historian. Normal language. Speech sl dysarthric. Follows simple commands. Left central 7 and tongue deviation. Right gaze preference.  LUE 2 to 2+/5. LLE 3 to 3+/5. RUE and RLE 4 to 4+/5. No obvious sensory deficits. No limb ataxia apparent. Normal resting tone and DTR's 1+.     Assessment/Plan: 1. Functional deficits which require 3+ hours per day of interdisciplinary therapy in a comprehensive inpatient rehab setting. Physiatrist is providing close team supervision and 24 hour management of active medical problems listed below. Physiatrist and rehab team continue to assess barriers to  discharge/monitor patient progress toward functional and medical goals  Care Tool:  Bathing  Bathing activity did not occur: Refused Body parts bathed by patient: Left arm, Chest, Abdomen, Face   Body parts bathed by helper: Right arm, Left lower leg, Right lower leg, Left upper leg, Right upper leg, Buttocks, Front perineal area     Bathing assist Assist Level: Maximal Assistance - Patient 24 - 49%     Upper Body Dressing/Undressing Upper body dressing Upper body dressing/undressing activity did not occur (including orthotics): Refused What is the patient wearing?: Pull over shirt    Upper body assist Assist Level: Moderate Assistance - Patient 50 - 74%    Lower Body Dressing/Undressing Lower body dressing    Lower body dressing activity did not occur: Refused What is the patient wearing?: Pants     Lower body assist Assist for lower body dressing: Maximal Assistance - Patient 25 - 49%     Toileting Toileting    Toileting assist Assist for toileting: Moderate Assistance - Patient 50 - 74%     Transfers Chair/bed transfer  Transfers assist  Chair/bed transfer activity did not occur: Safety/medical concerns  Chair/bed transfer assist level: Minimal Assistance - Patient > 75%  Locomotion Ambulation   Ambulation assist   Ambulation activity did not occur: Safety/medical concerns  Assist level: Minimal Assistance - Patient > 75% Assistive device: Walker-rolling Max distance: 23ft   Walk 10 feet activity   Assist  Walk 10 feet activity did not occur: Safety/medical concerns  Assist level: Minimal Assistance - Patient > 75% Assistive device: Walker-rolling   Walk 50 feet activity   Assist Walk 50 feet with 2 turns activity did not occur: Safety/medical concerns  Assist level: Minimal Assistance - Patient > 75% Assistive device: Walker-rolling    Walk 150 feet activity   Assist Walk 150 feet activity did not occur: Safety/medical  concerns  Assist level: Minimal Assistance - Patient > 75% Assistive device: Walker-rolling    Walk 10 feet on uneven surface  activity   Assist Walk 10 feet on uneven surfaces activity did not occur: Safety/medical concerns         Wheelchair     Assist Is the patient using a wheelchair?: Yes Type of Wheelchair: Manual Wheelchair activity did not occur:  (fatigue at end of session)  Wheelchair assist level: Dependent - Patient 0%      Wheelchair 50 feet with 2 turns activity    Assist        Assist Level: Dependent - Patient 0%   Wheelchair 150 feet activity     Assist      Assist Level: Dependent - Patient 0%   Blood pressure 124/70, pulse 88, temperature (!) 97.4 F (36.3 C), temperature source Oral, resp. rate 17, height 5\' 7"  (1.702 m), weight 76.6 kg, SpO2 96 %.    Medical Problem List and Plan: 1. Functional deficits  disconjugate gaze with altered mental status/acute hepatic metabolic encephalopathy/multifactorial secondary to right MCA scattered infarcts             -patient may shower             -ELOS/Goals: 14-17 days, min assist goals with PT, OT, SLP  Continue CIR  HFU scheduled.  2.  Impaired mobility -DVT/anticoagulation:  Mechanical: Antiembolism stockings, thigh (TED hose) Bilateral lower extremities             -antiplatelet therapy: continue Aspirin 81 mg daily 3. Thigh pain: add kpad. Tylenol held due to elevated LFTs 4. Mood: Provide emotional support             -antipsychotic agents: N/A 5. Neuropsych: This patient is capable of making decisions on his own behalf. 6. Skin/Wound Care: Routine skin checks 7. Fluids/Electrolytes/Nutrition: Routine in and outs with follow-up chemistries  8.  Acute blood loss anemia due to upper GI bleed esophageal varices.  Status post banding x4 12/30/2020.   -continue Protonix twice daily.   CBC Latest Ref Rng & Units 01/13/2021 01/10/2021 01/08/2021  WBC 4.0 - 10.5 K/uL 11.8(H) 11.5(H)  12.0(H)  Hemoglobin 13.0 - 17.0 g/dL 9.9(L) 8.5(L) 9.5(L)  Hematocrit 39.0 - 52.0 % 31.4(L) 26.6(L) 30.8(L)  Platelets 150 - 400 K/uL 143(L) 178 224    -Follow-up per GI services 9.  Acute hypoxic respiratory failure.  Extubated 01/06/2020. Discussed importance of using incentive spirometer. Wean off O2.  10.  Metastatic recurrent melanoma/left upper lobe lesion, mediastinal adenopathy.  Status post 4 cycles ipo.nivo.  Follow-up outpatient             -currently on oxygen. Wean to off as able 10.  COVID-19 infection.  Appeared incidental.  Isolation completed. 11.  Recurrent low-grade urothelial carcinoma.  Status post TURBT  x3.  Follow-up outpatient.   12.  Elevated LFTs/hepatic cirrhosis.  Markedly improved.  Continue lactulose.  Holding Lasix/Spiro due to AKI. Check LFTs today.  13.  AKI.  Status post solitary kidney status post nephrectomy due to [place]. Encouraged 6-8 glasses of water per day. Check Cr today BMP Latest Ref Rng & Units 01/21/2021 01/18/2021 01/13/2021  Glucose 70 - 99 mg/dL 149(H) 175(H) 126(H)  BUN 8 - 23 mg/dL 13 12 20   Creatinine 0.61 - 1.24 mg/dL 1.23 1.17 1.38(H)  BUN/Creat Ratio 10 - 24 - - -  Sodium 135 - 145 mmol/L 143 142 145  Potassium 3.5 - 5.1 mmol/L 3.8 3.9 4.2  Chloride 98 - 111 mmol/L 105 109 115(H)  CO2 22 - 32 mmol/L 24 24 22   Calcium 8.9 - 10.3 mg/dL 8.6(L) 8.4(L) 7.7(L)   Improving  14.  Diabetes mellitus.  Hemoglobin A1c 7.6.  SSI.            CBG (last 3)  Recent Labs    01/21/21 2108 01/22/21 0552 01/22/21 1152  GLUCAP 129* 110* 157*  Commended on improvements in CBG control.   15.  Hypertension.  Well controlled, continue Norvasc 10 mg daily.  Monitor with increased mobility Vitals:   01/21/21 1324 01/22/21 0334  BP: 134/69 124/70  Pulse: 79 88  Resp: 18 17  Temp: 97.6 F (36.4 C) (!) 97.4 F (36.3 C)  SpO2: 96% 96%    16.  Hyperlipidemia.  Lipitor resumed 10 mg daily 17. Obesity BMI 30.25-->28.80: provided dietary education. 18.  Diarrhea: decrease lactulose to every other day  19. Mild oropharyngeal dysphagia: continue SLP 20. Lowe extremity edema: lasix 10mg  on 1/18. Repeat creatinine stable, discussed with patient.  21. Diabetic peripheral neuropathy: discussed Qutenza outpatient.     LOS: 10 days A FACE TO FACE EVALUATION WAS PERFORMED  Clide Deutscher Angelos Wasco 01/22/2021, 12:46 PM

## 2021-01-22 NOTE — Progress Notes (Signed)
Physical Therapy Session Note  Patient Details  Name: Dalton Miller MRN: [id number] Date of Birth: October 24, 1945  Today's Date: 01/22/2021 PT Individual Time: 1000-1057 + 1500-1530 PT Individual Time Calculation (min): 57 min  + 30 min  Short Term Goals: Week 2:  PT Short Term Goal 1 (Week 2): STG = LTG due ELOS  Skilled Therapeutic Interventions/Progress Updates:     1st session: Pt supine in bed to start session - agreeable to PT tx and denies pain. Able to recall prior activities from SLP session. Pt completed supine<>sit with supervision with use of bed rail. Donned compression socks and hospital socks with totalA for time. Stand<>pivot transfer with minA from EOB to w/c with VC needed to fully turn prior to sitting. Pt then transported downstairs to 18M rehab gym for time where he was assisted to mat table with RW stand<>pivot transfer at [facility] level. Completed 2x5 sit<>stands with CGA and no AD from mat table to no AD with TC for forward weight shift and VC for reducing posterior bias. Worked on standing balance with blue airex foam pad while tasked to place resistive clothes pin on basketball net placed on L side to promote visual scanning L. Completed x2 bouts with 1st attempt pt relying on back of legs to support balance, minA level. 2nd attempt without back of legs for support, modA level with moderate posterior lean and delayed righting reactions. Concluded session with functional gait training within hallways, ambulating ~146ft with minA and RW with VC for maintaining gaze straight forward, keeping body within walker frame, and increasing L step length/height. Noted some L knee hyperextension after ~121ft with onset of fatigue. Pt returned remaining distance in w/c to his room due to fatigue. Concluded session seated in w/c with safety belt alarm on, 1/2 lap tray supporting LUE, all needs within reach.   2nd session: Pt supine in bed to start session - pt looking L at door on arrival, showing  improvement in L attention and L visual scanning. Pt reports he's "doing his homework" in terms of working on L scanning. Pt completing supine<>sit at supervision level with HOB flat, used bed rail. Donned tennis shoes with totalA for time. Sit<>Stand to RW with minA for powering to rise and assist needed for LUE placement into RW splint. Ambulated ~172ft in rehab hallways with CGA/minA and RW - onset of L knee hyperextension after ~39ft with VC needed for L knee awareness and increasing L knee flexion in stance to promote NMR. Pt assisted on Nustep - completed 9 minutes using BLE only to isolate - required brief rest break at ~7 minute mark due 4/10 R knee pain which resolved with rest. Of note, playing pt selected music during Nustep activity and pt becoming emotional and tearful. Sit<>Stand to RW from Nustep with minA for powering to rise - ambulating with CGA/minA and RW ~50ft to his room and required modA for sit>supine due to poor trunk control and difficulty rising LLE into bed. Pt made comfortable, pillow supporting LUE, all needs within reach and bed alarm on.   Therapy Documentation Precautions:  Precautions Precautions: Fall Precaution Comments: L inattention; L hemi; post covid; recent R shoulder arthroscopy and debridement (12/17/20) Restrictions Weight Bearing Restrictions: No General:    Therapy/Group: Individual Therapy  [name] 01/22/2021, 7:28 AM

## 2021-01-23 LAB — GLUCOSE, CAPILLARY
Glucose-Capillary: 103 mg/dL — ABNORMAL HIGH (ref 70–99)
Glucose-Capillary: 116 mg/dL — ABNORMAL HIGH (ref 70–99)
Glucose-Capillary: 142 mg/dL — ABNORMAL HIGH (ref 70–99)
Glucose-Capillary: 200 mg/dL — ABNORMAL HIGH (ref 70–99)

## 2021-01-23 NOTE — Progress Notes (Signed)
PROGRESS NOTE   Subjective/Complaints: No complaints this morning. Denies pain, constipation, insomnia.    ROS: denies CP, SOB, N/V, +peripheral neuropathy, wrist pain improved, +thigh pain, +left lower extremity swelling  Objective:   VAS [place] LOWER EXTREMITY VENOUS (DVT)  Result Date: 01/22/2021  Lower Venous DVT Study Patient Name:  Dalton Miller  Date of Exam:   01/22/2021 Medical Rec #: [id number]     Accession #:    [id number] Date of Birth: 1945/07/22      Patient Gender: M Patient Age:   36 years Exam Location:  [facility] Procedure:      VAS [place] LOWER EXTREMITY VENOUS (DVT) Referring Phys: Leeroy Cha --------------------------------------------------------------------------------  Indications: Swelling, and Edema.  Comparison Study: no prior Performing Technologist: Archie Patten RVS  Examination Guidelines: A complete evaluation includes B-mode imaging, spectral Doppler, color Doppler, and power Doppler as needed of all accessible portions of each vessel. Bilateral testing is considered an integral part of a complete examination. Limited examinations for reoccurring indications may be performed as noted. The reflux portion of the exam is performed with the patient in reverse Trendelenburg.  +-----+---------------+---------+-----------+----------+--------------+  RIGHT Compressibility Phasicity Spontaneity Properties Thrombus Aging  +-----+---------------+---------+-----------+----------+--------------+  CFV   Full            Yes       Yes                                    +-----+---------------+---------+-----------+----------+--------------+   +---------+---------------+---------+-----------+----------+--------------+  LEFT      Compressibility Phasicity Spontaneity Properties Thrombus Aging  +---------+---------------+---------+-----------+----------+--------------+  CFV       Full            Yes       Yes                                     +---------+---------------+---------+-----------+----------+--------------+  SFJ       Full                                                             +---------+---------------+---------+-----------+----------+--------------+  FV Prox   Full                                                             +---------+---------------+---------+-----------+----------+--------------+  FV Mid    Full                                                             +---------+---------------+---------+-----------+----------+--------------+  FV Distal Full                                                             +---------+---------------+---------+-----------+----------+--------------+  PFV       Full                                                             +---------+---------------+---------+-----------+----------+--------------+  POP       Full            Yes       Yes                                    +---------+---------------+---------+-----------+----------+--------------+  PTV       Full                                                             +---------+---------------+---------+-----------+----------+--------------+  PERO      Full                                                             +---------+---------------+---------+-----------+----------+--------------+     Summary: RIGHT: - No evidence of common femoral vein obstruction.  LEFT: - There is no evidence of deep vein thrombosis in the lower extremity.  - No cystic structure found in the popliteal fossa.  *See table(s) above for measurements and observations. Electronically signed by Deitra Mayo MD on 01/22/2021 at 54:17:46 PM.    Final    No results for input(s): WBC, HGB, HCT, PLT in the last 72 hours.  Recent Labs    01/21/21 1503  NA 143  K 3.8  CL 105  CO2 24  GLUCOSE 149*  BUN 13  CREATININE 1.23  CALCIUM 8.6*      Intake/Output Summary (Last 24 hours) at 01/23/2021 1604 Last data filed at 01/23/2021  1300 Gross per 24 hour  Intake 580 ml  Output --  Net 580 ml        Physical Exam: Vital Signs Blood pressure 120/83, pulse 84, temperature 98.3 F (36.8 C), temperature source Oral, resp. rate 20, height 5\' 7"  (1.702 m), weight 78.1 kg, SpO2 96 %. Gen: no distress, normal appearing HEENT: oral mucosa pink and moist, NCAT Cardio: Reg rate Chest: normal effort, normal rate of breathing Abd: soft, non-distended Ext: no edema Psych: pleasant, normal affect Skin: intact  Neurological:     Mental Status: He is alert.     Comments: Patient is alert.  Makes eye contact with examiner.  Provides name and age but limited medical historian. Normal language. Speech sl dysarthric. Follows simple commands. Left central 7 and  tongue deviation. Right gaze preference.  LUE 2 to 2+/5. LLE 3 to 3+/5. RUE and RLE 4 to 4+/5. No obvious sensory deficits. No limb ataxia apparent. Normal resting tone and DTR's 1+.     Assessment/Plan: 1. Functional deficits which require 3+ hours per day of interdisciplinary therapy in a comprehensive inpatient rehab setting. Physiatrist is providing close team supervision and 24 hour management of active medical problems listed below. Physiatrist and rehab team continue to assess barriers to discharge/monitor patient progress toward functional and medical goals  Care Tool:  Bathing  Bathing activity did not occur: Refused Body parts bathed by patient: Left arm, Chest, Abdomen, Face   Body parts bathed by helper: Right arm, Left lower leg, Right lower leg, Left upper leg, Right upper leg, Buttocks, Front perineal area     Bathing assist Assist Level: Maximal Assistance - Patient 24 - 49%     Upper Body Dressing/Undressing Upper body dressing Upper body dressing/undressing activity did not occur (including orthotics): Refused What is the patient wearing?: Pull over shirt    Upper body assist Assist Level: Moderate Assistance - Patient 50 - 74%    Lower Body  Dressing/Undressing Lower body dressing    Lower body dressing activity did not occur: Refused What is the patient wearing?: Pants     Lower body assist Assist for lower body dressing: Maximal Assistance - Patient 25 - 49%     Toileting Toileting    Toileting assist Assist for toileting: Moderate Assistance - Patient 50 - 74%     Transfers Chair/bed transfer  Transfers assist  Chair/bed transfer activity did not occur: Safety/medical concerns  Chair/bed transfer assist level: Minimal Assistance - Patient > 75%     Locomotion Ambulation   Ambulation assist   Ambulation activity did not occur: Safety/medical concerns  Assist level: Minimal Assistance - Patient > 75% Assistive device: Walker-rolling Max distance: 280ft   Walk 10 feet activity   Assist  Walk 10 feet activity did not occur: Safety/medical concerns  Assist level: Minimal Assistance - Patient > 75% Assistive device: Walker-rolling   Walk 50 feet activity   Assist Walk 50 feet with 2 turns activity did not occur: Safety/medical concerns  Assist level: Minimal Assistance - Patient > 75% Assistive device: Walker-rolling    Walk 150 feet activity   Assist Walk 150 feet activity did not occur: Safety/medical concerns  Assist level: Minimal Assistance - Patient > 75% Assistive device: Walker-rolling    Walk 10 feet on uneven surface  activity   Assist Walk 10 feet on uneven surfaces activity did not occur: Safety/medical concerns         Wheelchair     Assist Is the patient using a wheelchair?: Yes Type of Wheelchair: Manual Wheelchair activity did not occur:  (fatigue at end of session)  Wheelchair assist level: Dependent - Patient 0%      Wheelchair 50 feet with 2 turns activity    Assist        Assist Level: Dependent - Patient 0%   Wheelchair 150 feet activity     Assist      Assist Level: Dependent - Patient 0%   Blood pressure 120/83, pulse 84,  temperature 98.3 F (36.8 C), temperature source Oral, resp. rate 20, height 5\' 7"  (1.702 m), weight 78.1 kg, SpO2 96 %.    Medical Problem List and Plan: 1. Functional deficits  disconjugate gaze with altered mental status/acute hepatic metabolic encephalopathy/multifactorial secondary to right MCA scattered infarcts             -  patient may shower             -ELOS/Goals: 14-17 days, min assist goals with PT, OT, SLP  Continue CIR  HFU scheduled.  2.  Impaired mobility -DVT/anticoagulation:  Mechanical: Antiembolism stockings, thigh (TED hose) Bilateral lower extremities             -antiplatelet therapy: continue Aspirin 81 mg daily 3. Thigh pain: add kpad. Tylenol held due to elevated LFTs 4. Mood: Provide emotional support             -antipsychotic agents: N/A 5. Neuropsych: This patient is capable of making decisions on his own behalf. 6. Skin/Wound Care: Routine skin checks 7. Fluids/Electrolytes/Nutrition: Routine in and outs with follow-up chemistries  8.  Acute blood loss anemia due to upper GI bleed esophageal varices.  Status post banding x4 12/30/2020.   -continue Protonix twice daily.   CBC Latest Ref Rng & Units 01/13/2021 01/10/2021 01/08/2021  WBC 4.0 - 10.5 K/uL 11.8(H) 11.5(H) 12.0(H)  Hemoglobin 13.0 - 17.0 g/dL 9.9(L) 8.5(L) 9.5(L)  Hematocrit 39.0 - 52.0 % 31.4(L) 26.6(L) 30.8(L)  Platelets 150 - 400 K/uL 143(L) 178 224    -Follow-up per GI services 9.  Acute hypoxic respiratory failure.  Extubated 01/06/2020. Discussed importance of using incentive spirometer. Wean off O2.  10.  Metastatic recurrent melanoma/left upper lobe lesion, mediastinal adenopathy.  Status post 4 cycles ipo.nivo.  Follow-up outpatient             -currently on oxygen. Wean to off as able 10.  COVID-19 infection.  Appeared incidental.  Isolation completed. 11.  Recurrent low-grade urothelial carcinoma.  Status post TURBT x3.  Follow-up outpatient.   12.  Elevated LFTs/hepatic cirrhosis.   Markedly improved.  Continue lactulose.  Holding Lasix/Spiro due to AKI. Monitor LFTs as needed 13.  AKI.  Status post solitary kidney status post nephrectomy due to [place]. Encouraged 6-8 glasses of water per day. Cr reviewed and has normalized.  BMP Latest Ref Rng & Units 01/21/2021 01/18/2021 01/13/2021  Glucose 70 - 99 mg/dL 149(H) 175(H) 126(H)  BUN 8 - 23 mg/dL 13 12 20   Creatinine 0.61 - 1.24 mg/dL 1.23 1.17 1.38(H)  BUN/Creat Ratio 10 - 24 - - -  Sodium 135 - 145 mmol/L 143 142 145  Potassium 3.5 - 5.1 mmol/L 3.8 3.9 4.2  Chloride 98 - 111 mmol/L 105 109 115(H)  CO2 22 - 32 mmol/L 24 24 22   Calcium 8.9 - 10.3 mg/dL 8.6(L) 8.4(L) 7.7(L)   Improving  14.  Diabetes mellitus.  Hemoglobin A1c 7.6.  SSI.            CBG (last 3)  Recent Labs    01/22/21 2048 01/23/21 0614 01/23/21 1150  GLUCAP 154* 103* 116*  Commended on improvements in CBG control.   15.  Hypertension.  Well controlled, continue Norvasc 10 mg daily.  Monitor with increased mobility Vitals:   01/23/21 0804 01/23/21 1445  BP: 128/82 120/83  Pulse:  84  Resp:  20  Temp:  98.3 F (36.8 C)  SpO2:  96%    16.  Hyperlipidemia.  Lipitor resumed 10 mg daily 17. Obesity BMI 30.25-->28.80: provided dietary education. 18. Diarrhea: decrease lactulose to every other day  19. Mild oropharyngeal dysphagia: continue SLP 20. Lowe extremity edema: lasix 10mg  on 1/18. Repeat creatinine stable, discussed with patient. Vascular ultrasound obtained, reviewed, and negative.  21. Diabetic peripheral neuropathy: discussed Qutenza outpatient.     LOS: 11 days A FACE TO  FACE EVALUATION WAS PERFORMED  Clide Deutscher Cabrina Shiroma 01/23/2021, 4:04 PM

## 2021-01-23 NOTE — Progress Notes (Signed)
Speech Language Pathology Daily Session Note  Patient Details  Name: Dalton Miller MRN: [id number] Date of Birth: 1945/02/21  Today's Date: 01/23/2021 SLP Individual Time: 1300-1340 SLP Individual Time Calculation (min): 40 min  Short Term Goals: Week 2: SLP Short Term Goal 1 (Week 2): Pt will demonstrate alertness and sustained attention during functional task for 10-15 minutes with min A verbal cues in 75% of opportunties. SLP Short Term Goal 2 (Week 2): Pt will demonstrate basic to mildly complex problem solving skills in fucntional tasks with mod A verbal cues. SLP Short Term Goal 3 (Week 2): Pt will demonstrate self monitoring of functional errors in problem solving tasks with mod A multimodal cues. SLP Short Term Goal 4 (Week 2): Pt will demonstrate recall of daily and novel information with min A verbal cues to utilize compensatory aids. SLP Short Term Goal 5 (Week 2): Pt will scan left of midline in functional tasks to locate objects with 70% accuracy given mod A multimodal cues. SLP Short Term Goal 6 (Week 2): Pt will demonstarte use of swallow strategies on current diet reagular textures and thin liquids via provale cup with minimal overt s/s aspiration and min A verbal cues.  Skilled Therapeutic Interventions: Pt seen for skilled ST with focus on cognition and swallowing goals, pt in bed with Janine Limbo for lunch brought by son. Pt repositioned in bed to maximize safety with PO intake. Pt consuming regular meal with thin via Provale cup with no overt s/s aspiration, able to maintain swallow precautions and participate in conversation for ~30 minutes with Supervision A verbal cues. Pt reports having difficulty with taking pills with Provale cup, states by the time he gets enough liquid to swallow the pills they have started to "melt". Spoke with nursing staff to offer meds with NTL via cup if patient needs. Pt reports improved cognitive function but continues to endorse cognitive impairments  s/p CVA. Will be living with his son at discharge. Reviewed med list which patient was able to recall with 80% accuracy provided Min A cues. Pt left in bed with alarm set and all needs within reach. Cont ST POC.   Pain Pain Assessment Pain Scale: 0-10 Pain Score: 0-No pain  Therapy/Group: Individual Therapy  Dewaine Conger 01/23/2021, 1:36 PM

## 2021-01-24 LAB — GLUCOSE, CAPILLARY
Glucose-Capillary: 112 mg/dL — ABNORMAL HIGH (ref 70–99)
Glucose-Capillary: 138 mg/dL — ABNORMAL HIGH (ref 70–99)
Glucose-Capillary: 117 mg/dL — ABNORMAL HIGH (ref 70–99)
Glucose-Capillary: 209 mg/dL — ABNORMAL HIGH (ref 70–99)

## 2021-01-24 MED ORDER — MELATONIN 3 MG PO TABS
6.0000 mg | ORAL_TABLET | Freq: Every day | ORAL | Status: DC
  Administered 2021-01-24 – 2021-01-28 (×5): 6 mg via ORAL
  Filled 2021-01-24 (×5): qty 2

## 2021-01-24 NOTE — Progress Notes (Signed)
Physical Therapy Session Note  Patient Details  Name: Dalton Miller MRN: [id number] Date of Birth: 17-Mar-1945  Today's Date: 01/24/2021 PT Individual Time: 0900-1000 PT Individual Time Calculation (min): 60 min   Short Term Goals: Week 2:  PT Short Term Goal 1 (Week 2): STG = LTG due ELOS  Skilled Therapeutic Interventions/Progress Updates:    Pt received seated in bed, agreeable to PT session. No complaints of pain. Supine to sit with mod A for trunk elevation. Sitting balance EOB with min A due to frequent LOB to the L with inability to return to midline without assist. Assisted pt with donning knee high TEDs and shoes while seated EOB. Sit to stand with min A to RW with use of L hand orthosis. Stand pivot transfer to w/c with RW and min A. Dependent transport via w/c to/from therapy gym for time and energy conservation. Pt reports urgent urge to use the bathroom. Toilet transfer with use of RW and min A. Pt able to continently void BM and urine while seated on elevated BSC over toilet. Pt is dependent for pericare and donning of new brief. Remainder of session focus on standing balance with RW at [facility] performing visual scanning task to the L with CGA for balance and max cueing to scan for targets in L visual field. Pt scores 71.43% accuracy with reaction time of 11.86 sec and improves to 92.86% accuracy with 8.63 sec reaction time for 2nd trial of visual scanning task. Pt returned to room and left seated in w/c with needs in reach, quick release belt and chair alarm in place at end of session.  Therapy Documentation Precautions:  Precautions Precautions: Fall Precaution Comments: L inattention; L hemi; post covid; recent R shoulder arthroscopy and debridement (12/17/20) Restrictions Weight Bearing Restrictions: No     Therapy/Group: Individual Therapy   Excell Seltzer, PT, DPT, CSRS  01/24/2021, 12:40 PM

## 2021-01-25 LAB — GLUCOSE, CAPILLARY
Glucose-Capillary: 135 mg/dL — ABNORMAL HIGH (ref 70–99)
Glucose-Capillary: 124 mg/dL — ABNORMAL HIGH (ref 70–99)
Glucose-Capillary: 142 mg/dL — ABNORMAL HIGH (ref 70–99)
Glucose-Capillary: 72 mg/dL (ref 70–99)

## 2021-01-25 NOTE — Progress Notes (Signed)
PROGRESS NOTE   Subjective/Complaints: Sleepy this morning.  Melatonin increased to 6mg  yesterday as per patient's request. No issues reported overnight.   ROS: denies CP, SOB, N/V, +peripheral neuropathy, wrist pain improved, +thigh pain, +left lower extremity swelling, +insomnia- improved  Objective:   No results found. No results for input(s): WBC, HGB, HCT, PLT in the last 72 hours.  No results for input(s): NA, K, CL, CO2, GLUCOSE, BUN, CREATININE, CALCIUM in the last 72 hours.     Intake/Output Summary (Last 24 hours) at 01/25/2021 1013 Last data filed at 01/25/2021 9030 Gross per 24 hour  Intake 960 ml  Output 600 ml  Net 360 ml        Physical Exam: Vital Signs Blood pressure 127/69, pulse 88, temperature 98.9 F (37.2 C), temperature source Oral, resp. rate 16, height 5\' 7"  (1.702 m), weight 76.9 kg, SpO2 93 %. Gen: no distress, normal appearing HEENT: oral mucosa pink and moist, NCAT Cardio: Reg rate Chest: normal effort, normal rate of breathing Abd: soft, non-distended Ext: no edema Psych: pleasant, normal affect Skin: intact  Neurological:     Mental Status: He is alert.     Comments: Patient is alert.  Makes eye contact with examiner.  Provides name and age but limited medical historian. Normal language. Speech sl dysarthric. Follows simple commands. Left central 7 and tongue deviation. Right gaze preference.  Left sided inattention. LUE 2 to 2+/5. LLE 3 to 3+/5. RUE and RLE 4 to 4+/5. No obvious sensory deficits. No limb ataxia apparent. Normal resting tone and DTR's 1+.     Assessment/Plan: 1. Functional deficits which require 3+ hours per day of interdisciplinary therapy in a comprehensive inpatient rehab setting. Physiatrist is providing close team supervision and 24 hour management of active medical problems listed below. Physiatrist and rehab team continue to assess barriers to  discharge/monitor patient progress toward functional and medical goals  Care Tool:  Bathing  Bathing activity did not occur: Refused Body parts bathed by patient: Left arm, Chest, Abdomen, Face   Body parts bathed by helper: Right arm, Left lower leg, Right lower leg, Left upper leg, Right upper leg, Buttocks, Front perineal area     Bathing assist Assist Level: Maximal Assistance - Patient 24 - 49%     Upper Body Dressing/Undressing Upper body dressing Upper body dressing/undressing activity did not occur (including orthotics): Refused What is the patient wearing?: Pull over shirt    Upper body assist Assist Level: Moderate Assistance - Patient 50 - 74%    Lower Body Dressing/Undressing Lower body dressing    Lower body dressing activity did not occur: Refused What is the patient wearing?: Pants     Lower body assist Assist for lower body dressing: Maximal Assistance - Patient 25 - 49%     Toileting Toileting    Toileting assist Assist for toileting: Moderate Assistance - Patient 50 - 74%     Transfers Chair/bed transfer  Transfers assist  Chair/bed transfer activity did not occur: Safety/medical concerns  Chair/bed transfer assist level: Minimal Assistance - Patient > 75%     Locomotion Ambulation   Ambulation assist   Ambulation activity did not occur: Safety/medical  concerns  Assist level: Minimal Assistance - Patient > 75% Assistive device: Walker-rolling Max distance: 244ft   Walk 10 feet activity   Assist  Walk 10 feet activity did not occur: Safety/medical concerns  Assist level: Minimal Assistance - Patient > 75% Assistive device: Walker-rolling   Walk 50 feet activity   Assist Walk 50 feet with 2 turns activity did not occur: Safety/medical concerns  Assist level: Minimal Assistance - Patient > 75% Assistive device: Walker-rolling    Walk 150 feet activity   Assist Walk 150 feet activity did not occur: Safety/medical  concerns  Assist level: Minimal Assistance - Patient > 75% Assistive device: Walker-rolling    Walk 10 feet on uneven surface  activity   Assist Walk 10 feet on uneven surfaces activity did not occur: Safety/medical concerns         Wheelchair     Assist Is the patient using a wheelchair?: Yes Type of Wheelchair: Manual Wheelchair activity did not occur:  (fatigue at end of session)  Wheelchair assist level: Dependent - Patient 0%      Wheelchair 50 feet with 2 turns activity    Assist        Assist Level: Dependent - Patient 0%   Wheelchair 150 feet activity     Assist      Assist Level: Dependent - Patient 0%   Blood pressure 127/69, pulse 88, temperature 98.9 F (37.2 C), temperature source Oral, resp. rate 16, height 5\' 7"  (1.702 m), weight 76.9 kg, SpO2 93 %.    Medical Problem List and Plan: 1. Functional deficits  disconjugate gaze with altered mental status/acute hepatic metabolic encephalopathy/multifactorial secondary to right MCA scattered infarcts             -patient may shower             -ELOS/Goals: 14-17 days, min assist goals with PT, OT, SLP  Continue CIR  HFU scheduled.  2.  Impaired mobility -DVT/anticoagulation:  Mechanical: Antiembolism stockings, thigh (TED hose) Bilateral lower extremities             -antiplatelet therapy: continue Aspirin 81 mg daily 3. Thigh pain: add kpad. Tylenol held due to elevated LFTs 4. Mood: Provide emotional support             -antipsychotic agents: N/A 5. Neuropsych: This patient is capable of making decisions on his own behalf. 6. Skin/Wound Care: Routine skin checks 7. Fluids/Electrolytes/Nutrition: Routine in and outs with follow-up chemistries  8.  Acute blood loss anemia due to upper GI bleed esophageal varices.  Status post banding x4 12/30/2020.   -continue Protonix twice daily.   CBC Latest Ref Rng & Units 01/13/2021 01/10/2021 01/08/2021  WBC 4.0 - 10.5 K/uL 11.8(H) 11.5(H) 12.0(H)   Hemoglobin 13.0 - 17.0 g/dL 9.9(L) 8.5(L) 9.5(L)  Hematocrit 39.0 - 52.0 % 31.4(L) 26.6(L) 30.8(L)  Platelets 150 - 400 K/uL 143(L) 178 224    -Follow-up per GI services 9.  Acute hypoxic respiratory failure.  Extubated 01/06/2020. Discussed importance of using incentive spirometer. Wean off O2.  10.  Metastatic recurrent melanoma/left upper lobe lesion, mediastinal adenopathy.  Status post 4 cycles ipo.nivo.  Follow-up outpatient             -currently on oxygen. Wean to off as able 10.  COVID-19 infection.  Appeared incidental.  Isolation completed. 11.  Recurrent low-grade urothelial carcinoma.  Status post TURBT x3.  Follow-up outpatient.   12.  Elevated LFTs/hepatic cirrhosis.  Markedly improved.  Continue lactulose.  Holding Lasix/Spiro due to AKI. Monitor LFTs as needed 13.  AKI.  Status post solitary kidney status post nephrectomy due to [place]. Encouraged 6-8 glasses of water per day. Cr reviewed and has normalized.  BMP Latest Ref Rng & Units 01/21/2021 01/18/2021 01/13/2021  Glucose 70 - 99 mg/dL 149(H) 175(H) 126(H)  BUN 8 - 23 mg/dL 13 12 20   Creatinine 0.61 - 1.24 mg/dL 1.23 1.17 1.38(H)  BUN/Creat Ratio 10 - 24 - - -  Sodium 135 - 145 mmol/L 143 142 145  Potassium 3.5 - 5.1 mmol/L 3.8 3.9 4.2  Chloride 98 - 111 mmol/L 105 109 115(H)  CO2 22 - 32 mmol/L 24 24 22   Calcium 8.9 - 10.3 mg/dL 8.6(L) 8.4(L) 7.7(L)   Improving  14.  Diabetes mellitus.  Hemoglobin A1c 7.6.  SSI.            CBG (last 3)  Recent Labs    01/24/21 1647 01/24/21 2132 01/25/21 0620  GLUCAP 117* 209* 135*  Commended on improvements in CBG control.   15.  Hypertension.  Well controlled, continue Norvasc 10 mg daily.  Monitor with increased mobility Vitals:   01/25/21 0205 01/25/21 0324  BP: 110/60 127/69  Pulse: 83 88  Resp: 18 16  Temp: 99.8 F (37.7 C) 98.9 F (37.2 C)  SpO2: 92% 93%    16.  Hyperlipidemia.  Lipitor resumed 10 mg daily 17. Obesity BMI 30.25-->28.80: provided dietary  education. 18. Diarrhea: decrease lactulose to every other day  19. Mild oropharyngeal dysphagia: continue SLP 20. [place] extremity edema: lasix 10mg  on 1/18. Repeat creatinine stable, discussed with patient. Vascular ultrasound obtained, reviewed, and negative.  21. Diabetic peripheral neuropathy: discussed [place] outpatient.  22. Insomnia: increase melatonin to 6mg     LOS: 13 days A FACE TO FACE EVALUATION WAS PERFORMED  Dalton Miller 01/25/2021, 10:13 AM

## 2021-01-25 NOTE — Progress Notes (Signed)
Speech Language Pathology Daily Session Note  Patient Details  Name: Dalton Miller MRN: [id number] Date of Birth: 1945/05/10  Today's Date: 01/25/2021 SLP Individual Time: 4481-8563 SLP Individual Time Calculation (min): 43 min  Short Term Goals: Week 2: SLP Short Term Goal 1 (Week 2): Pt will demonstrate alertness and sustained attention during functional task for 10-15 minutes with min A verbal cues in 75% of opportunties. SLP Short Term Goal 2 (Week 2): Pt will demonstrate basic to mildly complex problem solving skills in fucntional tasks with mod A verbal cues. SLP Short Term Goal 3 (Week 2): Pt will demonstrate self monitoring of functional errors in problem solving tasks with mod A multimodal cues. SLP Short Term Goal 4 (Week 2): Pt will demonstrate recall of daily and novel information with min A verbal cues to utilize compensatory aids. SLP Short Term Goal 5 (Week 2): Pt will scan left of midline in functional tasks to locate objects with 70% accuracy given mod A multimodal cues. SLP Short Term Goal 6 (Week 2): Pt will demonstarte use of swallow strategies on current diet reagular textures and thin liquids via provale cup with minimal overt s/s aspiration and min A verbal cues.  Skilled Therapeutic Interventions:Skilled ST services focused on swallow and cognitive skills. SLP facilitated left scanning and sustained attention in reading medication list. SLP rewrote medication list to provide visual anchors ( numbering medication in red, then highlighting first 3 letters of medication name) to aid in scanning left of midline. Pt was able to locate medication name in 2/15 trials with visual anchors listed below, but only able to consistently locate names left of midline with increase cues of tapping paper beside medication name. Pt demonstrated sustained attention in 10 minute intervals with min A verbal cues. SLP will recommend pt's son complete medication and complex problem solving task largely  due to left inattention verse problem solving ability. Pt was able to utilize swallow strategies with 10cc provale cup of thin liquids with supervision A verbal cues for second swallow and practice of a 3 second oral prep hold with no overt s/s aspiration. SLP increased controled thin liquid amount to 15 cc in medication cup, pt was instructed to demonstrate oral hold and second swallow resulting in no overt s/s aspiration. SLP will continue trials to increase amount of thin liquids and use of swallow strategies piror to d/c of provale cup. SLP also update order for medication whole with nectar thick liquid if needed due to pt expressing difficulty initiating swallow with pureed textures and whole medication. Pt was left in room with call bell within reach and bed alarm set. SLP recommends to continue skilled services.     Pain Pain Assessment Pain Score: 0-No pain  Therapy/Group: Individual Therapy  Dalton Miller  [facility] 01/25/2021, 12:38 PM

## 2021-01-25 NOTE — Progress Notes (Signed)
Occupational Therapy Session Note  Patient Details  Name: Dalton Miller MRN: [id number] Date of Birth: 01-08-1945  Today's Date: 01/25/2021 OT Individual Time: 1235-1320 OT Individual Time Calculation (min): 45 min    Short Term Goals: Week 2:  OT Short Term Goal 1 (Week 2): Pt will complete LB dressing with min assist using hemi techniques with min VCs. OT Short Term Goal 2 (Week 2): Pt will complete self feeding with intermittent supervision using AE as needed. OT Short Term Goal 3 (Week 2): Pt will complete toileting 3/3 with min assist bowel and bladder. OT Short Term Goal 4 (Week 2): Pt will be able to scan sink counter top and retrieve needed ADL items for oral hygiene with min cues.   Skilled Therapeutic Interventions/Progress Updates:    Pt sitting up in w/c with right gaze, reporting he does not want to eat because he usually only eats one meal a day.  Requesting to take a shower. No c/o pain.  Ambulated using RW with CGA to shower bench and transferred into shower with step by step cues and CGA.  Bathed UB and LB with min assist overall.  After drying off, pt donned shirt with cues to attend to LUE and min assist to thread through sleeve.  Pt completed shower bench transfer with min assist and step pivot to [facility] using hand held assist and grab bar use with CGA.  Donned brief with mod assist and pants with min assist.  Ambulated to bed using RW then stand to sit and sit to supine with CGA.  Call bell in reach, bed alarm on.    Therapy Documentation Precautions:  Precautions Precautions: Fall Precaution Comments: L inattention; L hemi; post covid; recent R shoulder arthroscopy and debridement (12/17/20) Restrictions Weight Bearing Restrictions: No    Therapy/Group: Individual Therapy  Ezekiel Slocumb 01/25/2021, 2:13 PM

## 2021-01-25 NOTE — Progress Notes (Signed)
Physical Therapy Session Note  Patient Details  Name: Dalton Miller MRN: [id number] Date of Birth: Dec 03, 1945  Today's Date: 01/25/2021 PT Individual Time: 1130-1200 PT Individual Time Calculation (min): 30 min   Short Term Goals: Week 2:  PT Short Term Goal 1 (Week 2): STG = LTG due ELOS  Skilled Therapeutic Interventions/Progress Updates:    Pt received seated in bed, agreeable to PT session. No complaints of pain. Supine to sit with CGA for trunk elevation. Pt exhibits improved sitting balance this date. Assisted pt with donning knee-high TEDs and shoes while seated EOB. Sit to stand with CGA to RW with assist needed to place LUE in L hand splint in RW. Ambulation x 172 ft with RW and CGA, cues to attend to L visual field. Pt exhibits increase in R trunk rotation and decreased LLE clearance with onset of fatigue. Pt left seated in w/c in room with needs in reach, quick release belt and chair alarm in place at end of session.  Therapy Documentation Precautions:  Precautions Precautions: Fall Precaution Comments: L inattention; L hemi; post covid; recent R shoulder arthroscopy and debridement (12/17/20) Restrictions Weight Bearing Restrictions: No     Therapy/Group: Individual Therapy   Excell Seltzer, PT, DPT, CSRS  01/25/2021, 12:20 PM

## 2021-01-25 NOTE — Progress Notes (Signed)
Physical Therapy Session Note  Patient Details  Name: Dalton Miller MRN: [id number] Date of Birth: 1945/09/28  Today's Date: 01/25/2021 PT Individual Time: 1400-1455 PT Individual Time Calculation (min): 55 min   Short Term Goals: Week 2:  PT Short Term Goal 1 (Week 2): STG = LTG due ELOS  Skilled Therapeutic Interventions/Progress Updates:    Pt supine in bed sleeping on arrival. Pt awakens to voice and is agreeable to PT tx. Denies pain. Supine<>sit with supervision with HOB slightly raised, heavy reliance of bed rail. Donned compression socks and tennis shoes with totalA for time. Stand<>pivot transfer with minA and no AD from EOB to w/c and then transported downstairs to 46M rehab gym for time. Worked on car transfers - completed with minA for trunk support but required min/modA for BLE management into and out of the car (pt scooping LLE with RLE). Focused on BLE management during transfer and completed a 2nd time while using BUE to assist with lifting LLE into car - improved independence with this method. Noted some dyspnea on exertion while completing car transfers - vitals WNL with O2 97% while on RA. Pt then instructed in navigating ramp as he has ramped entrance into home - required CGA for safety and RW while ambulating up/down ~60ft ramp. Transported to main rehab gym for time (ADL apartment unavailable). Lowered mat table to simulate bed height. Stand<>pivot transfer with CGA and RW to mat table. At [facility] table, worked on bed mobility with sit<>supine transfers. Able to complete sit to supine with supervision but required CGA/minA for supine to sitting with assist primarily needed for trunk support. Education on using his stronger RUE to assist with pushing to upright from sidelying position. Completed x2 reps of this. Pt then instructed in dynamic standing task with game of horseshoes and no AD with no UE support. - completed x3 games, 1st game in static standing, 2nd game with stepping on R +  tossing, 2rd game with stepping on L + tossing. Required CGA for all except for 3rd game where he required minA due to L knee instability while forward weight shifting during stepping pattern. Pt returned upstairs in w/c for time and energy conservation. Remained seated in w/c with safety belt alarm on, all immediate needs within reach.   Therapy Documentation Precautions:  Precautions Precautions: Fall Precaution Comments: L inattention; L hemi; post covid; recent R shoulder arthroscopy and debridement (12/17/20) Restrictions Weight Bearing Restrictions: No General:    Therapy/Group: Individual Therapy  [name] 01/25/2021, 7:24 AM

## 2021-01-26 ENCOUNTER — Encounter (HOSPITAL_COMMUNITY): Payer: No Typology Code available for payment source | Admitting: Occupational Therapy

## 2021-01-26 LAB — GLUCOSE, CAPILLARY
Glucose-Capillary: 110 mg/dL — ABNORMAL HIGH (ref 70–99)
Glucose-Capillary: 144 mg/dL — ABNORMAL HIGH (ref 70–99)
Glucose-Capillary: 114 mg/dL — ABNORMAL HIGH (ref 70–99)
Glucose-Capillary: 128 mg/dL — ABNORMAL HIGH (ref 70–99)

## 2021-01-26 NOTE — Progress Notes (Signed)
PROGRESS NOTE   Subjective/Complaints: Discussed with daughter his progress medically and physically- she is receiving caregiver education from [place] now. Discussed healthy diet for him to better control his blood sugars.   ROS: denies CP, SOB, N/V, +peripheral neuropathy, wrist pain improved, +thigh pain, +left lower extremity swelling, +insomnia- improved  Objective:   No results found. No results for input(s): WBC, HGB, HCT, PLT in the last 72 hours.  No results for input(s): NA, K, CL, CO2, GLUCOSE, BUN, CREATININE, CALCIUM in the last 72 hours.     Intake/Output Summary (Last 24 hours) at 01/26/2021 0949 Last data filed at 01/26/2021 0304 Gross per 24 hour  Intake 220 ml  Output 250 ml  Net -30 ml        Physical Exam: Vital Signs Blood pressure 137/66, pulse 98, temperature 99.3 F (37.4 C), temperature source Oral, resp. rate 18, height 5\' 7"  (1.702 m), weight 76.3 kg, SpO2 98 %. Gen: no distress, normal appearing HEENT: oral mucosa pink and moist, NCAT Cardio: Reg rate Chest: normal effort, normal rate of breathing Abd: soft, non-distended Ext: no edema Psych: pleasant, normal affect Skin: intact  Neurological:     Mental Status: He is alert.     Comments: Patient is alert.  Makes eye contact with examiner.  Provides name and age but limited medical historian. Normal language. Speech sl dysarthric. Follows simple commands. Left central 7 and tongue deviation. Right gaze preference.  Left sided inattention. LUE 2 to 2+/5. LLE 3 to 3+/5. RUE and RLE 4 to 4+/5. No obvious sensory deficits. No limb ataxia apparent. Normal resting tone and DTR's 1+.     Assessment/Plan: 1. Functional deficits which require 3+ hours per day of interdisciplinary therapy in a comprehensive inpatient rehab setting. Physiatrist is providing close team supervision and 24 hour management of active medical problems listed  below. Physiatrist and rehab team continue to assess barriers to discharge/monitor patient progress toward functional and medical goals  Care Tool:  Bathing  Bathing activity did not occur: Refused Body parts bathed by patient: Left arm, Chest, Abdomen, Face   Body parts bathed by helper: Right arm, Left lower leg, Right lower leg, Left upper leg, Right upper leg, Buttocks, Front perineal area     Bathing assist Assist Level: Maximal Assistance - Patient 24 - 49%     Upper Body Dressing/Undressing Upper body dressing Upper body dressing/undressing activity did not occur (including orthotics): Refused What is the patient wearing?: Pull over shirt    Upper body assist Assist Level: Moderate Assistance - Patient 50 - 74%    Lower Body Dressing/Undressing Lower body dressing    Lower body dressing activity did not occur: Refused What is the patient wearing?: Pants     Lower body assist Assist for lower body dressing: Maximal Assistance - Patient 25 - 49%     Toileting Toileting    Toileting assist Assist for toileting: Moderate Assistance - Patient 50 - 74%     Transfers Chair/bed transfer  Transfers assist  Chair/bed transfer activity did not occur: Safety/medical concerns  Chair/bed transfer assist level: Minimal Assistance - Patient > 75%     Locomotion Ambulation  Ambulation assist   Ambulation activity did not occur: Safety/medical concerns  Assist level: Minimal Assistance - Patient > 75% Assistive device: Walker-rolling Max distance: 270ft   Walk 10 feet activity   Assist  Walk 10 feet activity did not occur: Safety/medical concerns  Assist level: Minimal Assistance - Patient > 75% Assistive device: Walker-rolling   Walk 50 feet activity   Assist Walk 50 feet with 2 turns activity did not occur: Safety/medical concerns  Assist level: Minimal Assistance - Patient > 75% Assistive device: Walker-rolling    Walk 150 feet activity   Assist  Walk 150 feet activity did not occur: Safety/medical concerns  Assist level: Minimal Assistance - Patient > 75% Assistive device: Walker-rolling    Walk 10 feet on uneven surface  activity   Assist Walk 10 feet on uneven surfaces activity did not occur: Safety/medical concerns         Wheelchair     Assist Is the patient using a wheelchair?: Yes Type of Wheelchair: Manual Wheelchair activity did not occur:  (fatigue at end of session)  Wheelchair assist level: Dependent - Patient 0%      Wheelchair 50 feet with 2 turns activity    Assist        Assist Level: Dependent - Patient 0%   Wheelchair 150 feet activity     Assist      Assist Level: Dependent - Patient 0%   Blood pressure 137/66, pulse 98, temperature 99.3 F (37.4 C), temperature source Oral, resp. rate 18, height 5\' 7"  (1.702 m), weight 76.3 kg, SpO2 98 %.    Medical Problem List and Plan: 1. Functional deficits  disconjugate gaze with altered mental status/acute hepatic metabolic encephalopathy/multifactorial secondary to right MCA scattered infarcts             -patient may shower             -ELOS/Goals: 14-17 days, min assist goals with PT, OT, SLP  Continue CIR  HFU scheduled.   Updated daughter  2.  Impaired mobility -DVT/anticoagulation:  Mechanical: Antiembolism stockings, thigh (TED hose) Bilateral lower extremities             -antiplatelet therapy: continue Aspirin 81 mg daily 3. Thigh pain: add kpad. Tylenol held due to elevated LFTs 4. Mood: Provide emotional support             -antipsychotic agents: N/A 5. Neuropsych: This patient is capable of making decisions on his own behalf. 6. Skin/Wound Care: Routine skin checks 7. Fluids/Electrolytes/Nutrition: Routine in and outs with follow-up chemistries  8.  Acute blood loss anemia due to upper GI bleed esophageal varices.  Status post banding x4 12/30/2020.   -continue Protonix twice daily.   CBC Latest Ref Rng & Units  01/13/2021 01/10/2021 01/08/2021  WBC 4.0 - 10.5 K/uL 11.8(H) 11.5(H) 12.0(H)  Hemoglobin 13.0 - 17.0 g/dL 9.9(L) 8.5(L) 9.5(L)  Hematocrit 39.0 - 52.0 % 31.4(L) 26.6(L) 30.8(L)  Platelets 150 - 400 K/uL 143(L) 178 224    -Follow-up per GI services 9.  Acute hypoxic respiratory failure.  Extubated 01/06/2020. Discussed importance of using incentive spirometer. Wean off O2.  10.  Metastatic recurrent melanoma/left upper lobe lesion, mediastinal adenopathy.  Status post 4 cycles ipo.nivo.  Follow-up outpatient             -currently on oxygen. Wean to off as able 10.  COVID-19 infection.  Appeared incidental.  Isolation completed. 11.  Recurrent low-grade urothelial carcinoma.  Status post TURBT x3.  Follow-up outpatient.   12.  Elevated LFTs/hepatic cirrhosis.  Markedly improved.  Continue lactulose.  Holding Lasix/Spiro due to AKI. Monitor LFTs as needed 13.  AKI.  Status post solitary kidney status post nephrectomy due to [employer]. Encouraged 6-8 glasses of water per day. Cr reviewed and has normalized.  BMP Latest Ref Rng & Units 01/21/2021 01/18/2021 01/13/2021  Glucose 70 - 99 mg/dL 149(H) 175(H) 126(H)  BUN 8 - 23 mg/dL 13 12 20   Creatinine 0.61 - 1.24 mg/dL 1.23 1.17 1.38(H)  BUN/Creat Ratio 10 - 24 - - -  Sodium 135 - 145 mmol/L 143 142 145  Potassium 3.5 - 5.1 mmol/L 3.8 3.9 4.2  Chloride 98 - 111 mmol/L 105 109 115(H)  CO2 22 - 32 mmol/L 24 24 22   Calcium 8.9 - 10.3 mg/dL 8.6(L) 8.4(L) 7.7(L)   Improving  14.  Diabetes mellitus.  Hemoglobin A1c 7.6.  SSI. Provided dietary education.             CBG (last 3)  Recent Labs    01/25/21 1652 01/25/21 2106 01/26/21 0554  GLUCAP 142* 72 110*  Commended on improvements in CBG control.   15.  Hypertension.  Well controlled, continue Norvasc 10 mg daily.  Monitor with increased mobility Vitals:   01/25/21 1925 01/26/21 0300  BP: 130/71 137/66  Pulse: 81 98  Resp: 16 18  Temp: 98.3 F (36.8 C) 99.3 F (37.4 C)  SpO2: 95% 98%    16.   Hyperlipidemia.  Lipitor resumed 10 mg daily 17. Obesity BMI 30.25-->28.80: provided dietary education. 18. Diarrhea: decrease lactulose to every other day  19. Mild oropharyngeal dysphagia: continue SLP 20. Lowe extremity edema: lasix 10mg  on 1/18. Repeat creatinine stable, discussed with patient. Vascular ultrasound obtained, reviewed, and negative.  21. Diabetic peripheral neuropathy: discussed [facility] outpatient.  22. Insomnia: increase melatonin to 6mg , improved, continue    LOS: 14 days A FACE TO FACE EVALUATION WAS PERFORMED  Clide Deutscher Jayden Rudge 01/26/2021, 9:49 AM

## 2021-01-26 NOTE — Progress Notes (Signed)
Occupational Therapy Weekly Progress Note  Patient Details  Name: Dalton Miller MRN: [id number] Date of Birth: 1945/09/26  Beginning of progress report period: January 13, 2021 End of progress report period: January 26, 2021  Today's Date: 01/26/2021 OT Individual Time: 0900-1000 OT Individual Time Calculation (min): 60 min    Patient has met 3 of 4 short term goals.  Pt is making steady progress towards goals and is consistently motivated to participate and receptive to learning during OT sessions.  Pt does present with persisting rightward gaze and left inattention and neglect which hinders his independence during functional mobility and self care as well as slower recovery of LUE motor control and strength, but despite this pt is still making slow gains in LUE movement.  Pt currently requires min assist for bathing, min to mod assist for LB dressing and toileting, min assist for UB dressing and oral hygiene.  Pt has strong social support at home and daughter demonstrates good understanding of assist techniques after caregiver education completed.  Pt will benefit from further neuro re-education to increase strength and control of LUE and also left sided awareness and improve safety during mobility and self care.    Patient continues to demonstrate the following deficits: muscle weakness, decreased cardiorespiratoy endurance, impaired timing and sequencing, motor apraxia, decreased coordination, and decreased motor planning, decreased visual perceptual skills, decreased visual motor skills, and field cut, decreased attention to left and left side neglect, decreased initiation, decreased attention, decreased awareness, decreased problem solving, decreased safety awareness, decreased memory, and delayed processing, and decreased sitting balance, decreased standing balance, decreased postural control, hemiplegia, and decreased balance strategies and therefore will continue to benefit from skilled OT  intervention to enhance overall performance with BADL.  Patient progressing toward long term goals..  Continue plan of care.  OT Short Term Goals Week 1:  OT Short Term Goal 1 (Week 1): Patient will 1/3 parts of toileting task with Min A. OT Short Term Goal 1 - Progress (Week 1): Met OT Short Term Goal 2 (Week 1): Patient will complete stand-pivot to [facility] with Min A and LRAD. OT Short Term Goal 2 - Progress (Week 1): Met OT Short Term Goal 3 (Week 1): Patient will don LB clothing with Min A and good return demo of hemi technique. OT Short Term Goal 3 - Progress (Week 1): Not progressing OT Short Term Goal 4 (Week 1): Patient will don UB clothing with Min A and good return demo of hemi technique. OT Short Term Goal 4 - Progress (Week 1): Met Week 2:  OT Short Term Goal 1 (Week 2): Pt will complete LB dressing with min assist using hemi techniques with min VCs. OT Short Term Goal 1 - Progress (Week 2): Met OT Short Term Goal 2 (Week 2): Pt will complete self feeding with intermittent supervision using AE as needed. OT Short Term Goal 2 - Progress (Week 2): Discontinued (comment) (unable to assess due to pt refusing meals stating he only eats once a day at baseline) OT Short Term Goal 3 (Week 2): Pt will complete toileting 3/3 with min assist bowel and bladder. OT Short Term Goal 3 - Progress (Week 2): Met OT Short Term Goal 4 (Week 2): Pt will be able to scan sink counter top and retrieve needed ADL items for oral hygiene with min cues. OT Short Term Goal 4 - Progress (Week 2): Met Week 3:  OT Short Term Goal 1 (Week 3): STGs=LTGs due to ELOS  Skilled  Therapeutic Interventions/Progress Updates:    Pt semi reclined in bed, daughter in room for family education.  Pt with no c/o pain throughout.  Educated daughter regarding pts current level of function and presence of left field cut and neglect.  Trained daughter on cueing techniques to increase pts awareness of left side of body and environment  including lighthouse technique.  Pt completed all functional mobility and self care with assist from daughter who demonstrated excellent participation and eagerness to learn under supervision of therapist.  Supine to sit with min assist.  Donned pants using hemi technique with mod assist.  Sit to stand with min assist and ambulated to bathroom with CGA and cues to attend to LUE when placing on walker splint.  Doffed shirt and donned clean shirt with cues for implementing hemi strategies and min assist overall to thread LUE through sleeve.  Pt ambulated to sink with CGA and cues to attend to left.  Pt requesting urinal urgently and required CGA with one occasion of min assist due to slight LOB posteriorly.  Pt had continent episode of urine with min assist for clothing mgt and urinal placement/removal.  Pt then completed oral hygiene with min assist and cues to scan to left side of sink as needed.  Pt returned to w/c with CGA and needed question cue to attend to left hand for removal from walker splint.  Call bell in reach, seat alarm on.    Therapy Documentation Precautions:  Precautions Precautions: Fall Precaution Comments: L inattention; L hemi; post covid; recent R shoulder arthroscopy and debridement (12/17/20) Restrictions Weight Bearing Restrictions: No    Therapy/Group: Individual Therapy  Ezekiel Slocumb 01/26/2021, 12:49 PM

## 2021-01-26 NOTE — Progress Notes (Signed)
Speech Language Pathology Daily Session Note  Patient Details  Name: AZION CENTRELLA MRN: [id number] Date of Birth: [birth year]  Today's Date: 01/26/2021 SLP Individual Time: 1035-1100 SLP Individual Time Calculation (min): 25 min  Short Term Goals: Week 2: SLP Short Term Goal 1 (Week 2): Pt will demonstrate alertness and sustained attention during functional task for 10-15 minutes with min A verbal cues in 75% of opportunties. SLP Short Term Goal 2 (Week 2): Pt will demonstrate basic to mildly complex problem solving skills in fucntional tasks with mod A verbal cues. SLP Short Term Goal 3 (Week 2): Pt will demonstrate self monitoring of functional errors in problem solving tasks with mod A multimodal cues. SLP Short Term Goal 4 (Week 2): Pt will demonstrate recall of daily and novel information with min A verbal cues to utilize compensatory aids. SLP Short Term Goal 5 (Week 2): Pt will scan left of midline in functional tasks to locate objects with 70% accuracy given mod A multimodal cues. SLP Short Term Goal 6 (Week 2): Pt will demonstarte use of swallow strategies on current diet reagular textures and thin liquids via provale cup with minimal overt s/s aspiration and min A verbal cues.  Skilled Therapeutic Interventions:Skilled ST services focused on education, swallow and cognition skills. Pt's daughter-in-law was present for education. SLP facilitated PO intake of increased thin liquids from 15cc, to 25 cc and then instructed small sips of thin liquid via cup. Pt demonstrated recall of 3 second bolus hold and required min A verbal cues for use of second swallow. Pt demonstrated no overt s/s aspiration. SLP upgraded thin liquids to small sips via cup with cues for bolus hold and secondary swallow. SLP provided education pertaining to swallow strategies, cognitive deficits primary left inattention as well as problem solving, error awareness, and short term recall. SLP instructed family to provide  supervision during meals and complete higher level IADLs including medication, money and time management. Family asked appropriate questions and all answered to satisfaction. Pt was left in room family, with call bell within reach and chair alarm set. SLP recommends to continue skilled services.     Pain Pain Assessment Pain Scale: 0-10 Pain Score: 0-No pain  Therapy/Group: Individual Therapy  Nadalyn Deringer  [facility] 01/26/2021, 11:25 AM

## 2021-01-26 NOTE — Progress Notes (Signed)
Physical Therapy Session Note  Patient Details  Name: AMIIR HECKARD MRN: [id number] Date of Birth: 08/20/1945  Today's Date: 01/26/2021 PT Individual Time: 1430-1525 PT Individual Time Calculation (min): 55 min    Short Term Goals: Week 2:  PT Short Term Goal 1 (Week 2): STG = LTG due ELOS  Skilled Therapeutic Interventions/Progress Updates:      2nd session: Pt sitting in w/c at start of session - agreeable to PT tx and denies any pain but reports fatigue from busy day of therapies. Requests light activity and assistance back to bed at end of session. Pt transported downstairs to 65M rehab gym for time and energy conservation. Assisted to mat table with stand<>pivot minA transfer. Completed the BERG balance test with results listed below. He required frequent and extended rest breaks throughout BERG testing due to fatigue. Of note, he has showed significant improvement in balance as shown in testing. On evaluation he scored 2/56, on 1/18 he scored 21/56, and today he scored 30/56.   Patient demonstrates increased fall risk as noted by score of 30/56 on Berg Balance Scale.  (<36= high risk for falls, close to 100%; 37-45 significant >80%; 46-51 moderate >50%; 52-55 lower >25%)  Educated on progress but also his high falls risk, which he voiced understanding. Pt returned to room in w/c for time and then assisted to bed with minA stand<>pivot transfer. Sit>supine with supervision with VC needed for aligning body symmetrically in bed. Bed alarm on, all needs in reach, pillows supporting LUE.   Therapy Documentation Precautions:  Precautions Precautions: Fall Precaution Comments: L inattention; L hemi; post covid; recent R shoulder arthroscopy and debridement (12/17/20) Restrictions Weight Bearing Restrictions: No General:   Balance: Balance Balance Assessed: Yes Standardized Balance Assessment Standardized Balance Assessment: Berg Balance Test Berg Balance Test Sit to Stand: Able to  stand  independently using hands Standing Unsupported: Able to stand 2 minutes with supervision Sitting with Back Unsupported but Feet Supported on Floor or Stool: Able to sit safely and securely 2 minutes Stand to Sit: Sits safely with minimal use of hands Transfers: Able to transfer with verbal cueing and /or supervision Standing Unsupported with Eyes Closed: Able to stand 10 seconds with supervision Standing Ubsupported with Feet Together: Able to place feet together independently and stand for 1 minute with supervision From Standing, Reach Forward with Outstretched Arm: Reaches forward but needs supervision From Standing Position, Pick up Object from Floor: Able to pick up shoe, needs supervision From Standing Position, Turn to Look Behind Over each Shoulder: Turn sideways only but maintains balance Turn 360 Degrees: Needs close supervision or verbal cueing Standing Unsupported, Alternately Place Feet on Step/Stool: Needs assistance to keep from falling or unable to try Standing Unsupported, One Foot in Front: Needs help to step but can hold 15 seconds Standing on One Leg: Unable to try or needs assist to prevent fall Total Score: 30/56  Therapy/Group: Individual Therapy  [name] 01/26/2021, 7:34 AM

## 2021-01-26 NOTE — Progress Notes (Signed)
Physical Therapy Session Note  Patient Details  Name: Dalton Miller MRN: [id number] Date of Birth: October 10, 1945  Today's Date: 01/26/2021 PT Individual Time: 1100-1155 PT Individual Time Calculation (min): 55 min   Short Term Goals: Week 2:  PT Short Term Goal 1 (Week 2): STG = LTG due ELOS  Skilled Therapeutic Interventions/Progress Updates:    Pt met sitting in w/c w/ daughter in law present ready for family training session. Discussed plan for session and confirmed d/c plan w/ family. Pt wheeled to ADL apartment for time to complete bed mobility. Sit<>stand from w/c w/ RW CGA to EOB. Sit<>supine w/ S and supine<>sit minA for trunk elevation. Pt had anterior LOB on EOB during transfer from supine due to being to close to EOB prior to transfer and getting feet crossed during transition. Pt transferred back to chair as noted above w/ family providing CGA. Family comfortable providing CGA assist level and educated on guarding on L side. Family continued to demonstrate correct assist and guarding throughout session. Pt wheeled to ortho gym for time for car transfer and ramp ambulation. Pt completed car transfer w/ MinA for L foot elevation over door threshold. Pt sit<>stand and ambulated ramp w/ RW and CGA from family. Pt then ambulated ~100 ft to elevators w/ family. Pt stand<>sit in w/c w/ CGA and wheeled back to room for time. Family questions answered regarding follow up care during transport. Confirmed DME equipment needed for home including shower bench and RW. Discussed w/c but determined it would not be necessary due to pt status and ability to ambulate short distances w/ RW. Pt and family verbalized agreement. Pt daughter in law stated they have handicap placard to use after d/c. Pt daughter in law stated pt's son will be present on Thursday and would like to go over family training w/ him when present.  Therapy Documentation Precautions:  Precautions Precautions: Fall Precaution Comments: L  inattention; L hemi; post covid; recent R shoulder arthroscopy and debridement (12/17/20) Restrictions Weight Bearing Restrictions: No General:     Therapy/Group: Individual Therapy  Lucile Shutters, SPT  01/26/2021, 7:52 AM

## 2021-01-27 ENCOUNTER — Other Ambulatory Visit (HOSPITAL_COMMUNITY): Payer: Self-pay

## 2021-01-27 LAB — GLUCOSE, CAPILLARY
Glucose-Capillary: 99 mg/dL (ref 70–99)
Glucose-Capillary: 129 mg/dL — ABNORMAL HIGH (ref 70–99)
Glucose-Capillary: 142 mg/dL — ABNORMAL HIGH (ref 70–99)
Glucose-Capillary: 78 mg/dL (ref 70–99)

## 2021-01-27 MED ORDER — GUAIFENESIN 100 MG/5ML PO LIQD
5.0000 mL | ORAL | Status: DC | PRN
  Administered 2021-01-27 – 2021-01-28 (×2): 5 mL via ORAL
  Filled 2021-01-27 (×2): qty 5

## 2021-01-27 NOTE — Progress Notes (Signed)
Occupational Therapy Session Note  Patient Details  Name: Dalton Miller MRN: [id number] Date of Birth: 1945-11-21  Today's Date: 01/27/2021 OT Individual Time: 1330-1440 OT Individual Time Calculation (min): 70 min    Short Term Goals: Week 3:  OT Short Term Goal 1 (Week 3): STGs=LTGs due to ELOS  Skilled Therapeutic Interventions/Progress Updates:    Pt sitting up in w/c, lunch tray in front of him, however pt sitting with right gaze and not eating.  Pt also actively coughing and stating he has been coughing so much his sides are hurting.  Nurse and MD made aware and medication administered at end of OT session to address cough.  Pt reports he does not feel hungry.  Pt requiring repetitive multimodal cues to attend to left side in order to identify all items on tray.  Pt ambulated to gym approximately 25 feet using RW needing supervision with one question cue each sit<>stand transfer to attend to left hand on RW splint and also cues to initiate lighthouse scanning technique with fair carryover by pt.   Sitting EOM, pt participated in [facility] neuro re-ed left hand with targets placed to promote external focus and multimodal cues to encourage motor planning and to achieve the following movements: finger abduction/adduction, intrinsic finger extension (with MCPs blocked), thumb abduction/adduction, and wrist extension (while grasping weightless cylindrical object), and wrist radial/ulnar deviation.  Pt completed 2 x 5 reps of each; noting little to no active movement during thumb abd/adduction.  Pt did exhibit 3/5 wrist extension when isolated however, pt struggles to incorporate this movement during functional grasping.  Educated pt on importance of tenodesis mechanism for improved grip and attempted carryover with blocked practice grasp release of soap bottle.   Pt completed elbow flexion and elbow extension both against gravity with  .5 lb wrist weight applied LUE.  Able to achieve full AROM against  gravity with weight.  Completed 3 x 10 reps each.   Ambulated back to room again needing close supervision and question cue to attend to left hand on splint.  Stand to sit in w/c with supervision, call bell in reach, seat alarm on.    Therapy Documentation Precautions:  Precautions Precautions: Fall Precaution Comments: L inattention; L hemi; post covid; recent R shoulder arthroscopy and debridement (12/17/20) Restrictions Weight Bearing Restrictions: No    Therapy/Group: Individual Therapy  Ezekiel Slocumb 01/27/2021, 2:45 PM

## 2021-01-27 NOTE — Progress Notes (Signed)
Inpatient Rehabilitation Discharge Medication Review by a Pharmacist  A complete drug regimen review was completed for this patient to identify any potential clinically significant medication issues.  High Risk Drug Classes Is patient taking? Indication by Medication  Antipsychotic No   Anticoagulant No   Antibiotic No   Opioid No   Antiplatelet No   Hypoglycemics/insulin No   Vasoactive Medication Yes Norvasc for BP  Chemotherapy No   Other Yes Lipitor for HLD Lactulose, rifaximin for hepatic encephalopathy Protonix for GERD     Type of Medication Issue Identified Description of Issue Recommendation(s)  Drug Interaction(s) (clinically significant)     Duplicate Therapy     Allergy     No Medication Administration End Date     Incorrect Dose     Additional Drug Therapy Needed     Significant med changes from prior encounter (inform family/care partners about these prior to discharge).    Other       Clinically significant medication issues were identified that warrant physician communication and completion of prescribed/recommended actions by midnight of the next day:  No  Pharmacist comments:   Time spent performing this drug regimen review (minutes):  20 minutes   Tad Moore 01/27/2021 10:16 AM

## 2021-01-27 NOTE — Discharge Summary (Signed)
Physician Discharge Summary  Patient ID: Dalton Miller MRN: [id number] DOB/AGE: 07/09/1945 76 y.o.  Admit date: 01/12/2021 Discharge date: 01/29/2021  Discharge Diagnoses:  Principal Problem:   Right middle cerebral artery stroke (HCC) Acute blood loss anemia/GI bleed/esophageal varices Acute hypoxic respiratory failure COVID-19 infection Recurrent low-grade urethral carcinoma Elevated LFTs/hepatic cirrhosis AKI Diabetes mellitus Hypertension Hyperlipidemia Obesity Tobacco use Metastatic recurrent melanoma/left upper lobe lesion mediastinal adenopathy  Discharged Condition: Stable  Significant Diagnostic Studies: CT ABDOMEN PELVIS WO CONTRAST  Result Date: 01/03/2021 CLINICAL DATA:  Portal hypertension suspected.  Suspect cirrhosis. EXAM: CT ABDOMEN AND PELVIS WITHOUT CONTRAST TECHNIQUE: Multidetector CT imaging of the abdomen and pelvis was performed following the standard protocol without IV contrast. COMPARISON:  04/04/2019 FINDINGS: Lower chest: Small bilateral pleural effusions. Airspace consolidation is identified within both lower lobes, right greater than left. Hepatobiliary: 9 mm low-attenuation structure within the lateral dome of liver is identified, image 11/3. This is a new finding when compared with the previous exam. Tiny low-density structure within the posterolateral dome of liver measuring less than 1 cm is also noted and appears new compared with the previous exam, image 10/3. Inferior and lateral margin of the right hepatic lobe a Spears slightly irregular which may reflect changes of early cirrhosis. Mild edema of the gallbladder wall is identified, image 25/3. Nonspecific in the setting of ascites. No calcified gallstones noted. High attenuation material within the gallbladder may reflect gallbladder sludge. Common bile duct is upper limits of normal measuring 7 mm. Pancreas: Unremarkable. No pancreatic ductal dilatation or surrounding inflammatory changes. Spleen: The  spleen measures 13 by 10.4 x 4.5 cm (volume = 320 cm^3). Adrenals/Urinary Tract: Normal right adrenal gland. Unchanged appearance of low-attenuation enlargement of the left adrenal gland, image 25/3. This is similar to 07/02/2015 and is compatible with underlying benign adenomas. Status post left nephrectomy. Right kidney cysts are again noted. The largest arises off the posterior cortex of the inferior pole measuring 2.6 cm. No right-sided hydronephrosis or nephrolithiasis. Urinary bladder is decompressed around a Foley catheter. Stomach/Bowel: Stomach is nondistended. The wall of the gastric antrum appears thickened measuring 1.8 cm, image 22/3. The appendix is not confidently identified. There are scattered areas of bowel wall thickening including the descending colon, proximal sigmoid colon and rectum. Findings are nonspecific in the setting of ascites and suspected portal venous hypertension. Differential considerations include portal hypertensive gastroenterocolopathy as well as inflammatory/infectious colitis. Vascular/Lymphatic: Aortic atherosclerosis. There is no abdominopelvic adenopathy Reproductive: Prostate is unremarkable. Other: Moderate volume of ascites is identified within the abdomen and pelvis. No discrete fluid collections identified. Musculoskeletal: Mild spondylosis within the thoracolumbar spine. Diffuse body wall edema is noted compatible with anasarca. IMPRESSION: 1. Morphologic features of the liver suggestive of early cirrhosis. There is a moderate volume of ascites within the abdomen and pelvis. 2. There are 2 new low-attenuation structures within the dome of liver. These are new when compared with CT from 04/04/2019. Cannot exclude benign or malignant liver lesions. When the patient is clinically stable and able to follow directions and hold their breath (preferably as an outpatient) further evaluation with dedicated multiphase contrast enhanced abdominal MRI should be considered. 3.  Wall thickening of the gastric antrum is identified. Additionally, there are several areas of nonspecific bowel wall edema involving the left colon, sigmoid colon and rectum. Nonspecific in the setting of portal venous hypertension. Findings may reflect inflammatory/infectious colitis versus portal hypertensive gastroenterocolopathy. Clinical correlation advised. 4. Bilateral lower lobe airspace consolidation is noted, right greater than  left. Suspicious for pneumonia and or aspiration. 5. Small bilateral pleural effusions. 6. Aortic Atherosclerosis (ICD10-I70.0). Electronically Signed   By: Kerby Moors M.D.   On: 01/03/2021 09:10   DG Chest 2 View  Result Date: 01/28/2021 CLINICAL DATA:  Cough EXAM: CHEST - 2 VIEW COMPARISON:  01/02/2021 FINDINGS: Mild peribronchial thickening and interstitial prominence. Heart is normal size. No confluent opacities or effusions. No acute bony abnormality. IMPRESSION: Mild bronchitic changes. Electronically Signed   By: Rolm Baptise M.D.   On: 01/28/2021 18:18   DG Abd 1 View  Result Date: 01/04/2021 CLINICAL DATA:  Feeding tube placement. EXAM: ABDOMEN - 1 VIEW COMPARISON:  CT abdomen and pelvis 01/03/2021. FINDINGS: Intraoperative feeding tube placement. 1 low resolution intraoperative spot views of the upper abdomen were obtained. Feeding tube was placed with distal tip near the duodenal bulb. Total fluoroscopy time: 54 seconds Total radiation dose: Not recorded IMPRESSION: Feeding tube placement as above. Electronically Signed   By: Ronney Asters M.D.   On: 01/04/2021 16:18   MR ANGIO HEAD WO CONTRAST  Result Date: 12/31/2020 CLINICAL DATA:  Stroke follow up. EXAM: MRI HEAD WITHOUT CONTRAST MRA HEAD WITHOUT CONTRAST TECHNIQUE: Multiplanar, multi-echo pulse sequences of the brain and surrounding structures were acquired without intravenous contrast. Angiographic images of the Circle of Willis were acquired using MRA technique without intravenous contrast.  COMPARISON:  2 days ago. FINDINGS: MRI HEAD FINDINGS Brain: Patchy restricted diffusion along the right cerebral convexity involving the frontal, parietal, and occipital cortex. Restricted diffusion and swelling at the right stratum with mild petechial hemorrhage. Mild chronic small vessel ischemia in the hemispheric white matter no hydrocephalus, shift, or collection. Vascular: See below. Skull and upper cervical spine: Normal marrow signal Sinuses/Orbits: Minor mastoid and sinus opacification in the setting of nasopharyngeal fluid. MRA HEAD FINDINGS Anterior circulation: Atheromatous irregularity of the cavernous carotids. 70% right M1 segment stenosis with wasted appearance rather than the appearance of central clot/embolism. No beading or aneurysm. Posterior circulation: The vertebral and basilar arteries are smoothly contoured and widely patent. Proximal basilar fenestration. IMPRESSION: 1. Scattered cortical and subcortical acute infarcts in the right MCA distribution with mild petechial hemorrhage. 2. 70% stenosis at the right M1 segment. Electronically Signed   By: Jorje Guild M.D.   On: 12/31/2020 07:12   MR BRAIN WO CONTRAST  Result Date: 12/31/2020 CLINICAL DATA:  Stroke follow up. EXAM: MRI HEAD WITHOUT CONTRAST MRA HEAD WITHOUT CONTRAST TECHNIQUE: Multiplanar, multi-echo pulse sequences of the brain and surrounding structures were acquired without intravenous contrast. Angiographic images of the Circle of Willis were acquired using MRA technique without intravenous contrast. COMPARISON:  2 days ago. FINDINGS: MRI HEAD FINDINGS Brain: Patchy restricted diffusion along the right cerebral convexity involving the frontal, parietal, and occipital cortex. Restricted diffusion and swelling at the right stratum with mild petechial hemorrhage. Mild chronic small vessel ischemia in the hemispheric white matter no hydrocephalus, shift, or collection. Vascular: See below. Skull and upper cervical spine:  Normal marrow signal Sinuses/Orbits: Minor mastoid and sinus opacification in the setting of nasopharyngeal fluid. MRA HEAD FINDINGS Anterior circulation: Atheromatous irregularity of the cavernous carotids. 70% right M1 segment stenosis with wasted appearance rather than the appearance of central clot/embolism. No beading or aneurysm. Posterior circulation: The vertebral and basilar arteries are smoothly contoured and widely patent. Proximal basilar fenestration. IMPRESSION: 1. Scattered cortical and subcortical acute infarcts in the right MCA distribution with mild petechial hemorrhage. 2. 70% stenosis at the right M1 segment. Electronically Signed  By: Jorje Guild M.D.   On: 12/31/2020 07:12   DG Chest Port 1 View  Result Date: 01/02/2021 CLINICAL DATA:  Respiratory failure with hypoxia. Altered mental status. EXAM: PORTABLE CHEST 1 VIEW COMPARISON:  01/01/2021 FINDINGS: The ETT tip is stable above the carina. Interval placement of left arm PICC line with tip in the cavoatrial junction. Stable cardiomediastinal contours. The lung volumes are low and there is atelectasis noted within both lung bases. IMPRESSION: 1. Satisfactory position of left arm PICC line. 2. Low lung volumes with bibasilar atelectasis. Electronically Signed   By: Kerby Moors M.D.   On: 01/02/2021 12:54   DG Chest Port 1 View  Result Date: 01/01/2021 CLINICAL DATA:  Acute respiratory failure; COVID positive EXAM: PORTABLE CHEST 1 VIEW COMPARISON:  12/31/2020 FINDINGS: Endotracheal tube is unchanged. Persistent opacity at the right lung base with change in configuration. No pleural effusion or pneumothorax. Stable cardiomediastinal contours. IMPRESSION: Right basilar atelectasis/consolidation. Electronically Signed   By: Macy Mis M.D.   On: 01/01/2021 08:08   DG Chest Port 1 View  Result Date: 12/31/2020 CLINICAL DATA:  Intubation, CVA EXAM: PORTABLE CHEST 1 VIEW COMPARISON:  Chest x-ray 12/29/2020 FINDINGS:  Endotracheal tube tip is 4.7 cm above the carina. Enteric tube has been removed. Linear opacities in the right lower lung zone likely represent subsegmental atelectasis. No pleural effusion or pneumothorax identified. IMPRESSION: 1. Medical devices as described. 2. Linear opacities in the right lower lung zone likely represent subsegmental atelectasis. Electronically Signed   By: Ofilia Neas M.D.   On: 12/31/2020 08:50   ECHOCARDIOGRAM COMPLETE  Result Date: 12/30/2020    ECHOCARDIOGRAM REPORT   Patient Name:   KAVION MANCINAS Date of Exam: 12/30/2020 Medical Rec #:  [id number]    Height:       67.0 in Accession #:    [id number]   Weight:       170.0 lb Date of Birth:  [birth year]     BSA:          1.887 m Patient Age:    89 years     BP:           102/63 mmHg Patient Gender: M            HR:           89 bpm. Exam Location:  Inpatient Procedure: 2D Echo, Cardiac Doppler, Color Doppler and Intracardiac            Opacification Agent Indications:    Stroke  History:        Patient has no prior history of Echocardiogram examinations.  Sonographer:    Jyl Heinz Referring Phys: [id number] [place]  1. Left ventricular ejection fraction, by estimation, is 50 to 55%. The left ventricle has low normal function. The left ventricle has no regional wall motion abnormalities. Left ventricular diastolic parameters are consistent with Grade I diastolic dysfunction (impaired relaxation).  2. Right ventricular systolic function is normal. The right ventricular size is normal. There is normal pulmonary artery systolic pressure.  3. The mitral valve is normal in structure. Trivial mitral valve regurgitation. No evidence of mitral stenosis.  4. The aortic valve is tricuspid. There is mild calcification of the aortic valve. There is mild thickening of the aortic valve. Aortic valve regurgitation is not visualized. Aortic valve sclerosis is present, with no evidence of aortic valve stenosis.  5. The inferior vena  cava is normal in size with <50% respiratory variability,  suggesting right atrial pressure of 8 mmHg. FINDINGS  Left Ventricle: Left ventricular ejection fraction, by estimation, is 50 to 55%. The left ventricle has low normal function. The left ventricle has no regional wall motion abnormalities. The left ventricular internal cavity size was normal in size. There is no left ventricular hypertrophy. Left ventricular diastolic parameters are consistent with Grade I diastolic dysfunction (impaired relaxation). Normal left ventricular filling pressure. Right Ventricle: The right ventricular size is normal. No increase in right ventricular wall thickness. Right ventricular systolic function is normal. There is normal pulmonary artery systolic pressure. The tricuspid regurgitant velocity is 1.55 m/s, and  with an assumed right atrial pressure of 8 mmHg, the estimated right ventricular systolic pressure is 53.6 mmHg. Left Atrium: Left atrial size was normal in size. Right Atrium: Right atrial size was normal in size. Pericardium: There is no evidence of pericardial effusion. Mitral Valve: The mitral valve is normal in structure. Trivial mitral valve regurgitation. No evidence of mitral valve stenosis. Tricuspid Valve: The tricuspid valve is normal in structure. Tricuspid valve regurgitation is trivial. No evidence of tricuspid stenosis. Aortic Valve: The aortic valve is tricuspid. There is mild calcification of the aortic valve. There is mild thickening of the aortic valve. Aortic valve regurgitation is not visualized. Aortic valve sclerosis is present, with no evidence of aortic valve stenosis. Aortic valve peak gradient measures 10.4 mmHg. Pulmonic Valve: The pulmonic valve was normal in structure. Pulmonic valve regurgitation is not visualized. No evidence of pulmonic stenosis. Aorta: The aortic root is normal in size and structure. Venous: The inferior vena cava is normal in size with less than 50% respiratory  variability, suggesting right atrial pressure of 8 mmHg. IAS/Shunts: No atrial level shunt detected by color flow Doppler.  LEFT VENTRICLE PLAX 2D LVIDd:         4.40 cm     Diastology LVIDs:         2.70 cm     LV e' medial:    4.13 cm/s LV PW:         0.80 cm     LV E/e' medial:  14.7 LV IVS:        0.80 cm     LV e' lateral:   7.07 cm/s LVOT diam:     2.00 cm     LV E/e' lateral: 8.6 LV SV:         46 LV SV Index:   25 LVOT Area:     3.14 cm  LV Volumes (MOD) LV vol d, MOD A2C: 76.9 ml LV vol d, MOD A4C: 89.2 ml LV vol s, MOD A2C: 31.9 ml LV vol s, MOD A4C: 42.6 ml LV SV MOD A2C:     45.0 ml LV SV MOD A4C:     89.2 ml LV SV MOD BP:      44.9 ml RIGHT VENTRICLE             IVC RV Basal diam:  2.80 cm     IVC diam: 1.70 cm RV Mid diam:    2.00 cm RV S prime:     16.30 cm/s TAPSE (M-mode): 1.9 cm LEFT ATRIUM             Index        RIGHT ATRIUM           Index LA diam:        2.10 cm 1.11 cm/m   RA Area:     11.20 cm  LA Vol (A2C):   33.2 ml 17.59 ml/m  RA Volume:   23.60 ml  12.51 ml/m LA Vol (A4C):   24.3 ml 12.88 ml/m LA Biplane Vol: 29.3 ml 15.53 ml/m  AORTIC VALVE AV Area (Vmax): 1.89 cm AV Vmax:        161.00 cm/s AV Peak Grad:   10.4 mmHg LVOT Vmax:      97.00 cm/s LVOT Vmean:     69.600 cm/s LVOT VTI:       0.148 m  AORTA Ao Root diam: 3.00 cm Ao Asc diam:  3.20 cm MITRAL VALVE               TRICUSPID VALVE MV Area (PHT): 3.05 cm    TR Peak grad:   9.6 mmHg MV Decel Time: 249 msec    TR Vmax:        155.00 cm/s MV E velocity: 60.90 cm/s MV A velocity: 63.60 cm/s  SHUNTS MV E/A ratio:  0.96        Systemic VTI:  0.15 m                            Systemic Diam: 2.00 cm Skeet Latch MD Electronically signed by Skeet Latch MD Signature Date/Time: 12/30/2020/1:34:13 PM    Final    VAS US CAROTID  Result Date: 12/30/2020 Carotid Arterial Duplex Study Patient Name:  HRIDHAAN YOHN  Date of Exam:   12/30/2020 Medical Rec #: [id number]     Accession #:    [id number] Date of Birth: 13-Mar-1945       Patient Gender: M Patient Age:   79 years Exam Location:  [facility] Procedure:      VAS US CAROTID Referring Phys: Rosalin Hawking --------------------------------------------------------------------------------  Indications:       CVA and altered mental status after a fall at home. Risk Factors:      Hypertension, hyperlipidemia, Diabetes. Comparison Study:  No prior. Performing Technologist: Oda Cogan RDMS, RVT  Examination Guidelines: A complete evaluation includes B-mode imaging, spectral Doppler, color Doppler, and power Doppler as needed of all accessible portions of each vessel. Bilateral testing is considered an integral part of a complete examination. Limited examinations for reoccurring indications may be performed as noted.  Right Carotid Findings: +----------+--------+--------+--------+------------------+--------+             PSV cm/s EDV cm/s Stenosis Plaque Description Comments  +----------+--------+--------+--------+------------------+--------+  CCA Prox   99       27                                             +----------+--------+--------+--------+------------------+--------+  CCA Distal 89       31                                             +----------+--------+--------+--------+------------------+--------+  ICA Prox   120      43       40-59%   calcific                     +----------+--------+--------+--------+------------------+--------+  ICA Mid    140      57       40-59%  calcific                     +----------+--------+--------+--------+------------------+--------+  ICA Distal 136      53                                             +----------+--------+--------+--------+------------------+--------+ +----------+--------+-------+----------------+-------------------+             PSV cm/s EDV cms Describe         Arm Pressure (mmHG)  +----------+--------+-------+----------------+-------------------+  Subclavian 98               Multiphasic, WNL                       +----------+--------+-------+----------------+-------------------+ +---------+--------+---+--------+--+---------+  Vertebral PSV cm/s 109 EDV cm/s 32 Antegrade  +---------+--------+---+--------+--+---------+  Left Carotid Findings: +----------+--------+--------+--------+------------------+--------+             PSV cm/s EDV cm/s Stenosis Plaque Description Comments  +----------+--------+--------+--------+------------------+--------+  CCA Prox   98       25                                             +----------+--------+--------+--------+------------------+--------+  CCA Distal 88       24                                             +----------+--------+--------+--------+------------------+--------+  ICA Prox   213      73       60-79%   heterogenous                 +----------+--------+--------+--------+------------------+--------+  ICA Mid    148      55       40-59%   heterogenous                 +----------+--------+--------+--------+------------------+--------+  ICA Distal 115      49                                             +----------+--------+--------+--------+------------------+--------+  ECA        107      21                                             +----------+--------+--------+--------+------------------+--------+ +----------+--------+--------+----------------+-------------------+             PSV cm/s EDV cm/s Describe         Arm Pressure (mmHG)  +----------+--------+--------+----------------+-------------------+  Subclavian 134               Multiphasic, WNL                      +----------+--------+--------+----------------+-------------------+ +---------+--------+--+--------+--+---------+  Vertebral PSV cm/s 89 EDV cm/s 25 Antegrade  +---------+--------+--+--------+--+---------+   Summary: Right Carotid: Velocities in the right ICA are consistent with a 40-59%  stenosis. Left Carotid: Velocities in the left ICA are consistent with a 60-79% stenosis. Vertebrals:  Bilateral  vertebral arteries demonstrate antegrade flow. Subclavians: Normal flow hemodynamics were seen in bilateral subclavian              arteries. *See table(s) above for measurements and observations.  Electronically signed by Jamelle Haring on 12/30/2020 at 3:19:41 PM.    Final    VAS [place] LOWER EXTREMITY VENOUS (DVT)  Result Date: 01/22/2021  Lower Venous DVT Study Patient Name:  RUMALDO DIFATTA  Date of Exam:   01/22/2021 Medical Rec #: [id number]     Accession #:    [id number] Date of Birth: [birth year]      Patient Gender: M Patient Age:   49 years Exam Location:  [facility] Procedure:      VAS [place] LOWER EXTREMITY VENOUS (DVT) Referring Phys: Leeroy Cha --------------------------------------------------------------------------------  Indications: Swelling, and Edema.  Comparison Study: no prior Performing Technologist: Archie Patten RVS  Examination Guidelines: A complete evaluation includes B-mode imaging, spectral Doppler, color Doppler, and power Doppler as needed of all accessible portions of each vessel. Bilateral testing is considered an integral part of a complete examination. Limited examinations for reoccurring indications may be performed as noted. The reflux portion of the exam is performed with the patient in reverse Trendelenburg.  +-----+---------------+---------+-----------+----------+--------------+  RIGHT Compressibility Phasicity Spontaneity Properties Thrombus Aging  +-----+---------------+---------+-----------+----------+--------------+  CFV   Full            Yes       Yes                                    +-----+---------------+---------+-----------+----------+--------------+   +---------+---------------+---------+-----------+----------+--------------+  LEFT      Compressibility Phasicity Spontaneity Properties Thrombus Aging  +---------+---------------+---------+-----------+----------+--------------+  CFV       Full            Yes       Yes                                     +---------+---------------+---------+-----------+----------+--------------+  SFJ       Full                                                             +---------+---------------+---------+-----------+----------+--------------+  FV Prox   Full                                                             +---------+---------------+---------+-----------+----------+--------------+  FV Mid    Full                                                             +---------+---------------+---------+-----------+----------+--------------+  FV Distal Full                                                             +---------+---------------+---------+-----------+----------+--------------+  PFV       Full                                                             +---------+---------------+---------+-----------+----------+--------------+  POP       Full            Yes       Yes                                    +---------+---------------+---------+-----------+----------+--------------+  PTV       Full                                                             +---------+---------------+---------+-----------+----------+--------------+  PERO      Full                                                             +---------+---------------+---------+-----------+----------+--------------+     Summary: RIGHT: - No evidence of common femoral vein obstruction.  LEFT: - There is no evidence of deep vein thrombosis in the lower extremity.  - No cystic structure found in the popliteal fossa.  *See table(s) above for measurements and observations. Electronically signed by Deitra Mayo MD on 01/22/2021 at 34:17:46 PM.    Final    [place] EKG SITE RITE  Result Date: 01/01/2021 If Site Rite image not attached, placement could not be confirmed due to current cardiac rhythm.   Labs:  Basic Metabolic Panel: No results for input(s): NA, K, CL, CO2, GLUCOSE, BUN, CREATININE, CALCIUM, MG, PHOS in the last 168 hours.   CBC: No results  for input(s): WBC, NEUTROABS, HGB, HCT, MCV, PLT in the last 168 hours.  CBG: Recent Labs  Lab 01/27/21 2118 01/28/21 0641 01/28/21 1158 01/28/21 1817 01/28/21 2102  GLUCAP 78 90 139* 99 126*   Family history.  Mother with diabetes CAD CVA.  Father with diabetes and CHF.  Denies any colon cancer esophageal cancer or rectal cancer  Brief HPI:   VANE YAPP is a 76 y.o. right-handed male with history of melanoma with lung metastasis and urothelial carcinoma/renal cell cancer status post nephrectomy hypertension hyperlipidemia recent shoulder surgery diabetes mellitus COPD with tobacco use and right TKA.  Per chart review lives with youngest son.  Modified independent prior to admission.  Presented 12/29/2020 with altered mental status after a fall to [facility].  Admission chemistries unremarkable except glucose 284 BUN 95 creatinine 1.77 AST 324 ALT 229 alkaline phosphatase 261 total bilirubin 3.6 WBC 40,700 hemoglobin 7.1 ammonia level 115 lactic acid greater than 9.0 blood cultures no growth to date CK 357 and COVID-19 positive.  He was found to be obtunded and demonstrated disconjugate gaze as well as noted to be jaundiced.  He required intubation for airway protection due to hypoxia.  Family  had noted complaints of abdominal pain and black tarry stools from previous day.  Hemoccult was positive.  CT of the head showed ill-defined hypodensity involving the right basal ganglia and right frontal lobe concerning for acute right MCA territory infarction.  CT cervical spine negative for acute spine fracture or traumatic listhesis.  Ultrasound abdomen showed gallbladder wall thickening without evidence of cholelithiasis or additional findings to suggest the presence of acute cholecystitis.  He was transferred to [facility].  MRI/MRA 12/31/2020 scattered cortical and subcortical acute infarcts in the right MCA distribution with mild petechial hemorrhage.  70% stenosis of the right M1  segment.  Echocardiogram with ejection fraction of 50 to 55% no wall motion abnormalities grade 1 diastolic dysfunction.  Hospital course underwent upper GI endoscopy 12/30/2020 per Dr. Fuller Plan findings of grade 2 varices in the distal esophagus requiring banding some red blood clots in the gastric fundus.  Normal duodenal bulb and second portion of duodenum.  He did receive 1 unit packed red blood cells.  In regards to patient's COVID-19 was not able to take paxlovid or malnupiravir due to LFT derangement and outside of window for benefit of steroid.  Continued isolation x10 days from positive test and completed.  Neurology follow-up for CVA currently maintained on low-dose aspirin for CVA prophylaxis no antithrombotics at this time due to GI bleed.  He was weaned from pressor support extubated 01/05/2021.  Rectal tube and Foley tube removed 01/10/2021.  LFTs have markedly improved remained on lactulose.  CT abdomen pelvis follow-up 01/05/2021 morphology feature liver suggestive of early cirrhosis moderate volume of ascites within the abdomen and pelvis.  There were 2 low-attenuation structures within the dome of the liver.  These were new when compared to CT of 04/04/2019.  Cannot exclude benign or malignant liver lesions and recommendations were for outpatient follow-up.  He was tolerating a regular diet.  W OC follow-up for MASD buttocks, perineal area scrotum posterior medial thighs secondary to fecal incontinence with recommendations of routine skin care.  Therapy evaluations completed due to patient decreased functional mobility was admitted for a comprehensive rehab program.   Hospital Course: Dalton Miller was admitted to rehab 01/12/2021 for inpatient therapies to consist of PT, ST and OT at least three hours five days a week. Past admission physiatrist, therapy team and rehab RN have worked together to provide customized collaborative inpatient rehab.  Pertaining to patient's altered mental status acute hepatic  metabolic encephalopathy multifactorial continue to participate with therapies demonstrating increase in functional mobility.  He remained on low-dose aspirin.  Acute blood loss anemia due to upper GI bleed esophageal varices status post banding per gastroenterology services continue Protonix follow-up outpatient.  No further bleeding episodes.  Acute hypoxic respiratory failure extubated 01/05/2021 monitoring of oxygen saturations.  Metastatic recurrent melanoma/left upper lobe lesion mediastinal adenopathy status post 4 cycles ipo.nivo.  Follow-up outpatient.  COVID-19 appeared incidental isolation completed.  Recurrent low-grade urothelial carcinoma status post TURBT x3 follow-up outpatient.  Elevated LFTs hepatic cirrhosis markedly improved Lasix spironolactone initially held monitor LFTs.  AKI status post solitary kidney status post nephrectomy due to [place] follow-up outpatient.  Blood sugars overall controlled hemoglobin A1c 7.6.  Norvasc ongoing for hypertension.  Lipitor resumed for hyperlipidemia.  Obesity BMI 30.25 dietary follow-up.   Blood pressures were monitored on TID basis and controlled  Diabetes has been monitored with ac/hs CBG checks and SSI was use prn for tighter BS control.    Rehab course: During patient's stay in  rehab weekly team conferences were held to monitor patient's progress, set goals and discuss barriers to discharge. At admission, patient required moderate assist stand pivot transfers moderate assist step pivot transfers moderate assist supine to sit  Physical exam.  Blood pressure 128/76 pulse 73 temperature 98 respiration 20 oxygen saturation 97% room air Constitutional.  No acute distress HEENT Head.  Normocephalic and atraumatic Eyes.  Pupils round and reactive to light no discharge without nystagmus Neck.  Supple nontender no JVD without thyromegaly Cardiac regular rate rhythm any extra sounds or murmur heard Abdomen.  Soft nontender positive bowel sounds without  rebound Respiratory effort normal no respiratory distress without wheeze Neurologic.  Alert makes eye contact with examiner.  Provides name and age but limited medical historian.  Normal language.  Speech was slightly dysarthric.  Follows commands.  Left central 7 tongue deviation.  Right gaze preference.  Left upper extremity 2-2+/5.  Left lower extremity 3-3+/5.  Right upper extremity and right lower extremity 4-4+/5.  No obvious sensory deficits.  He/She  has had improvement in activity tolerance, balance, postural control as well as ability to compensate for deficits. He/She has had improvement in functional use RUE/LUE  and RLE/LLE as well as improvement in awareness.  Sit to stand from wheelchair rolling walker contact-guard sit to supine with supervision supine to sit minimal assist for trunk elevation.  Family continue to work with education.  Ambulating 100 feet to the elevators contact-guard.  Making steady progress with ADLs still demonstrating persistent rightward gaze and left inattention and neglect which hindered his independence.  Required minimal assist for bathing min mod assist for lower body dressing and toileting minimal assist for upper body dressing and oral hygiene.  Full family teaching completed plan discharged home       Disposition: Discharged home    Diet: Carb modified  Special Instructions: No driving smoking or alcohol  Medications at discharge 1.  Tylenol as needed 2.  Norvasc 5 mg p.o. daily 3.  Aspirin 81 mg p.o. daily 4.  Lipitor 10 mg p.o. daily 5.  Gerhardt Butt cream applied a thin layer after cleansing the buttocks scrotum perineal area and posterior and medial thighs every 4 hours as needed 6.  Lactulose 10 mg every other day 7.  Melatonin 6 mg p.o. nightly 8.  Multivitamin daily 9.  Protonix 40 mg p.o. twice daily 10.Xifaxan 550 mg p.o. twice daily  30-35 minutes were spent completing discharge summary and discharge planning  Discharge  Instructions     Ambulatory referral to Neurology   Complete by: As directed    An appointment is requested in approximately: 4 weeks right MCA infarction        Follow-up Information     Raulkar, Clide Deutscher, MD Follow up.   Specialty: Physical Medicine and Rehabilitation Why: 2/27 please arrive at 9:40am for 10:00am appointment, thank you! Contact information: [address] [place] [address] [phone]         Ladene Artist, MD Follow up on 02/02/2021.   Specialty: Gastroenterology Why: Appointment @ 11:10 Am Contact information: [address] [address] [phone]         [facility] Follow up.   Contact information: [place] [address] [phone]                 Signed: Lavon Paganini Stutsman 01/29/2021, 5:30 AM

## 2021-01-27 NOTE — Progress Notes (Signed)
Speech Language Pathology Discharge Summary  Patient Details  Name: Dalton Miller MRN: [id number] Date of Birth: 1945-06-17  Today's Date: 01/28/2021 SLP Individual Time: 2458-0998 SLP Individual Time Calculation (min): 45 min   Skilled Therapeutic Interventions:  Pt seen for skilled ST with focus on cognitive and swallow goals, pt in bed and agreeable to therapeutic tasks. Pt only complaint is ongoing cough (without presence of PO) that he states he received medication for yesterday. With supervision A, pt oriented to all concepts and able to recall memory notebook and detail events of previous day. Pt able to list 3/3 swallow strategies with Mod I. Pt observed with thin liquids via cup benefiting from Supervision A cues to implement swallow strategies across trials, occasional throat clear noted, cough x1. Pt states he feels ready for discharge with daughter and law and son for supervision. Pt provided education on continued use of compensatory memory strategies, compensatory swallow strartegies and assist with higher level cognitive tasks. Pt agreeable. Left in bed with alarm set and all needs within reach.  Patient has met 7 of 7 long term goals.  Patient to discharge at overall Supervision;Min;Mod level.   Clinical Impression/Discharge Summary:   Pt made good progress meeting 7 out 7 goals, supervision A for swallow function and mod-min A for cognitive skills largely impacted by left inattention. Pt participate in MBS indicating sensed aspiration on large sips of thin liquids due to premature spillage and delay in swallow initiation. Pt tolerated provale cup of 10cc and then was upgraded to small cup sips with intermittent supervision A for swallow strategies (3 second bolus hold and secondary swallow.) Pt's primary deficits impacting cognitive skills is left inattention, continuing to require max-mod A. Pt demonstrated improvement in problem solving, memory and sustained attention, however skills  continue to be a barrier to d/c (pt scored 16/30 (n=>25) on SLUMS.) Education was completed with pt's daughter-in-law and all questions answered to satisfaction. Pt benefited from skilled ST services in order to maximize functional independence and reduce burden of care, requiring 24 hour supervision at discharge with continued skilled ST services.   Care Partner:  Caregiver Able to Provide Assistance: Yes  Type of Caregiver Assistance: Physical;Cognitive  Recommendation:  Home Health SLP;24 hour supervision/assistance;Outpatient SLP  Rationale for SLP Follow Up: Reduce caregiver burden;Maximize cognitive function and independence   Equipment: N/A   Reasons for discharge: Discharged from hospital   Patient/Family Agrees with Progress Made and Goals Achieved: Yes    MADISON  CRATCH 01/27/2021, 11:30 AM

## 2021-01-27 NOTE — Patient Care Conference (Cosign Needed)
Inpatient RehabilitationTeam Conference and Plan of Care Update Date: 01/27/2021   Time: 1:06 PM    Patient Name: Dalton Miller      Medical Record Number: [id number]  Date of Birth: 1945/05/08 Sex: Male         Room/Bed: 5C20C/5C20C-01 Payor Info: Payor: [employer] MEDICARE / Plan: [employer] MEDICARE CHOICE PPO / Product Type: *No Product type* /    Admit Date/Time:  01/12/2021  2:22 PM  Primary Diagnosis:  Right middle cerebral artery stroke [facility])  Hospital Problems: Principal Problem:   Right middle cerebral artery stroke [facility])    Expected Discharge Date: Expected Discharge Date: 01/29/21  Team Members Present: Physician leading conference: Dr. Leeroy Cha Nurse Present: Other (comment) Benjie Karvonen) PT Present: Ginnie Smart, PT OT Present: Leretha Pol, OT SLP Present: Charolett Bumpers, SLP     Current Status/Progress Goal Weekly Team Focus  Bowel/Bladder   Pt is continent/incontinent of bowel/bladder  Pt will become continent of bowel/bladder  Will assess qshift and PRN   Swallow/Nutrition/ Hydration   regular textures and thin liquids via cup sips, intermittment supervision ( bolus hold and secondary swallow)  Supervision A  education and carryover of swallow strategies   ADL's   CGA functional transfer, min assist self care  min assist  self care training with hemi techniques, left attention, LUE neuro re-ed, caregiver education, functional transfer training   Mobility   CGA bed mobility, CGA/minA sit<>stand and stand<>pivot transfers, CGA gait 147ft with RW, minA car transfers  CGA overall  DC planning, family education with son and daughter in-law, gait training, dynamic balance training, safety awareness, attending to L   Communication             Safety/Cognition/ Behavioral Observations  max-mod A, increase sustained attention and recall  min-mod A - downgraded 1/24 due to severity of deficits when scanning left  education, memory notebook, left scanning, problem  solving and error awareness   Pain   Pt is pain free  Pt will remain pain free  Will assess qshift and PRN   Skin   Pt has MASD to bottom  Pt's skin will clear up  Will assess qshift and PRN     Discharge Planning:  Family was here for education yesterday and it went well according to pt. Preparing for DC Friday-ordered DME from [place] and Neuro-OP referral sent. Family aware pt will require 24/7 care   Team Discussion: Family very involved. DME delivery planned. Incontinent at night, ongoing MASD.  Patient on target to meet rehab goals: yes, cognition goal downgraded  *See Care Plan and progress notes for long and short-term goals.   Revisions to Treatment Plan:  Upgrade to thin liquids. Downgrade to mod assist with cognition  Teaching Needs: Diabetes, incontinence care, weight management, medication management  Current Barriers to Discharge: Incontinence  Possible Resolutions to Barriers: Timed toileting and family education     Medical Summary Current Status: incontinent, loose stool left sided neglect, overweight, left lower extremity swelling, type 2 diabetes with hyperglycemia, wrist pain  Barriers to Discharge: Medical stability  Barriers to Discharge Comments: incontinent, loose stool left sided neglect, overweight, left lower extremity swelling, type 2 diabetes with hyperglycemia, wrist pain Possible Resolutions to [employer] Focus: provided dietary education to patient and daughter, vascular ultrasound negative for clots, continue to monitor creatinine as needed, continue ice for wrist   Continued Need for Acute Rehabilitation Level of Care: The patient requires daily medical management by a physician with specialized training  in physical medicine and rehabilitation for the following reasons: Direction of a multidisciplinary physical rehabilitation program to maximize functional independence : Yes Medical management of patient stability for increased activity during  participation in an intensive rehabilitation regime.: Yes Analysis of laboratory values and/or radiology reports with any subsequent need for medication adjustment and/or medical intervention. : Yes   I attest that I was present, lead the team conference, and concur with the assessment and plan of the team.   Barrett Shell 01/27/2021, 1:06 PM

## 2021-01-27 NOTE — Progress Notes (Signed)
Physical Therapy Session Note  Patient Details  Name: Dalton Miller MRN: [id number] Date of Birth: Dec 21, 1945  Today's Date: 01/27/2021 PT Individual Time: 6986-1483 PT Individual Time Calculation (min): 39 min   Short Term Goals: Week 1:  PT Short Term Goal 1 (Week 1): Pt will complete bed mobility with minA and no hospital bed features PT Short Term Goal 2 (Week 1): Pt will complete bed<>chair transfers with modA and LRAD PT Short Term Goal 3 (Week 1): Pt will ambulate 76f with modA and LRAD PT Short Term Goal 4 (Week 1): Pt will improve BERG score >7 points Week 2:  PT Short Term Goal 1 (Week 2): STG = LTG due ELOS   Skilled Therapeutic Interventions/Progress Updates:   Pt received sitting in WC and agreeable to PT. [facility] wth CGA for clothing management x 1557fand Lhand splint to maintain grasp on the L side. Cues  for Ad management and attention to the LUE in turns. Pt performed forward and lateral step ups to 4 inch step x 8 forward and x 5 bil with BUE supported on rail. Min assist due to poor grasp on the LUE as well as imrpvoed safety and clothing management. Prolonged rest break between bouts due to BLE fatigue. Pt requesting to return to bed. [facility] pivot transfer with UE supported on bed rail R. Assist from PT to doff shoes, Sit>supine completed with supervision assist for safety, and left supine in bed with call bell in reach and all needs met.        Therapy Documentation Precautions:  Precautions Precautions: Fall Precaution Comments: L inattention; L hemi; post covid; recent R shoulder arthroscopy and debridement (12/17/20) Restrictions Weight Bearing Restrictions: No    Vital Signs: Therapy Vitals Temp: 98.1 F (36.7 C) Temp Source: Oral Pulse Rate: 82 Resp: 15 BP: 120/72 Patient Position (if appropriate): Sitting Oxygen Therapy SpO2: 99 % O2 Device: Room  Air Pain: denies    Therapy/Group: Individual Therapy  AuLorie Phenix/25/2023, 4:13 PM

## 2021-01-27 NOTE — Progress Notes (Signed)
Patient ID: Dalton Miller, male   DOB: December 16, 1945, 76 y.o.   MRN: [id number]  Have sent order for equipment to [place] liaison. Aware pt's discharge date is Friday. Family education went well yesterday, according to pt. Prefers coming here for Neuro-OP therapies will make referral.

## 2021-01-27 NOTE — Progress Notes (Signed)
PROGRESS NOTE   Subjective/Complaints: Incontinence Family ed went well yesterday Sleepy this AM On track for d/c  ROS: denies CP, SOB, N/V, +peripheral neuropathy, wrist pain improved, +thigh pain, +left lower extremity swelling, +insomnia- improved  Objective:   No results found. No results for input(s): WBC, HGB, HCT, PLT in the last 72 hours.  No results for input(s): NA, K, CL, CO2, GLUCOSE, BUN, CREATININE, CALCIUM in the last 72 hours.     Intake/Output Summary (Last 24 hours) at 01/27/2021 1109 Last data filed at 01/26/2021 1838 Gross per 24 hour  Intake 240 ml  Output --  Net 240 ml        Physical Exam: Vital Signs Blood pressure 125/76, pulse 90, temperature 97.8 F (36.6 C), resp. rate 17, height 5\' 7"  (1.702 m), weight 74.2 kg, SpO2 93 %. Gen: no distress, normal appearing HEENT: oral mucosa pink and moist, NCAT Cardio: Reg rate Chest: normal effort, normal rate of breathing Abd: soft, non-distended Ext: no edema Psych: pleasant, normal affect Skin: intact  Neurological:     Mental Status: He is alert.     Comments: Patient is alert.  Makes eye contact with examiner.  Provides name and age but limited medical historian. Normal language. Speech sl dysarthric. Follows simple commands. Left central 7 and tongue deviation. Right gaze preference.  Left sided inattention. LUE 2 to 2+/5. LLE 3 to 3+/5. RUE and RLE 4 to 4+/5. No obvious sensory deficits. No limb ataxia apparent. Normal resting tone and DTR's 1+.     Assessment/Plan: 1. Functional deficits which require 3+ hours per day of interdisciplinary therapy in a comprehensive inpatient rehab setting. Physiatrist is providing close team supervision and 24 hour management of active medical problems listed below. Physiatrist and rehab team continue to assess barriers to discharge/monitor patient progress toward functional and medical goals  Care  Tool:  Bathing  Bathing activity did not occur: Refused Body parts bathed by patient: Left arm, Chest, Abdomen, Face   Body parts bathed by helper: Right arm, Left lower leg, Right lower leg, Left upper leg, Right upper leg, Buttocks, Front perineal area     Bathing assist Assist Level: Maximal Assistance - Patient 24 - 49%     Upper Body Dressing/Undressing Upper body dressing Upper body dressing/undressing activity did not occur (including orthotics): Refused What is the patient wearing?: Pull over shirt    Upper body assist Assist Level: Moderate Assistance - Patient 50 - 74%    Lower Body Dressing/Undressing Lower body dressing    Lower body dressing activity did not occur: Refused What is the patient wearing?: Pants     Lower body assist Assist for lower body dressing: Maximal Assistance - Patient 25 - 49%     Toileting Toileting    Toileting assist Assist for toileting: Moderate Assistance - Patient 50 - 74%     Transfers Chair/bed transfer  Transfers assist  Chair/bed transfer activity did not occur: Safety/medical concerns  Chair/bed transfer assist level: Minimal Assistance - Patient > 75%     Locomotion Ambulation   Ambulation assist   Ambulation activity did not occur: Safety/medical concerns  Assist level: Minimal Assistance - Patient >  75% Assistive device: Walker-rolling Max distance: 259ft   Walk 10 feet activity   Assist  Walk 10 feet activity did not occur: Safety/medical concerns  Assist level: Minimal Assistance - Patient > 75% Assistive device: Walker-rolling   Walk 50 feet activity   Assist Walk 50 feet with 2 turns activity did not occur: Safety/medical concerns  Assist level: Minimal Assistance - Patient > 75% Assistive device: Walker-rolling    Walk 150 feet activity   Assist Walk 150 feet activity did not occur: Safety/medical concerns  Assist level: Minimal Assistance - Patient > 75% Assistive device:  Walker-rolling    Walk 10 feet on uneven surface  activity   Assist Walk 10 feet on uneven surfaces activity did not occur: Safety/medical concerns         Wheelchair     Assist Is the patient using a wheelchair?: Yes Type of Wheelchair: Manual Wheelchair activity did not occur:  (fatigue at end of session)  Wheelchair assist level: Dependent - Patient 0%      Wheelchair 50 feet with 2 turns activity    Assist        Assist Level: Dependent - Patient 0%   Wheelchair 150 feet activity     Assist      Assist Level: Dependent - Patient 0%   Blood pressure 125/76, pulse 90, temperature 97.8 F (36.6 C), resp. rate 17, height 5\' 7"  (1.702 m), weight 74.2 kg, SpO2 93 %.    Medical Problem List and Plan: 1. Functional deficits  disconjugate gaze with altered mental status/acute hepatic metabolic encephalopathy/multifactorial secondary to right MCA scattered infarcts             -patient may shower             -ELOS/Goals: 14-17 days, min assist goals with PT, OT, SLP  Continue CIR  HFU scheduled.   Updated daughter  2.  Impaired mobility -DVT/anticoagulation:  Mechanical: Antiembolism stockings, thigh (TED hose) Bilateral lower extremities             -antiplatelet therapy: continue Aspirin 81 mg daily 3. Thigh pain: add kpad. Tylenol held due to elevated LFTs 4. Mood: Provide emotional support             -antipsychotic agents: N/A 5. Neuropsych: This patient is capable of making decisions on his own behalf. 6. Skin/Wound Care: Routine skin checks 7. Fluids/Electrolytes/Nutrition: Routine in and outs with follow-up chemistries  8.  Acute blood loss anemia due to upper GI bleed esophageal varices.  Status post banding x4 12/30/2020.   -continue Protonix twice daily.   CBC Latest Ref Rng & Units 01/13/2021 01/10/2021 01/08/2021  WBC 4.0 - 10.5 K/uL 11.8(H) 11.5(H) 12.0(H)  Hemoglobin 13.0 - 17.0 g/dL 9.9(L) 8.5(L) 9.5(L)  Hematocrit 39.0 - 52.0 % 31.4(L)  26.6(L) 30.8(L)  Platelets 150 - 400 K/uL 143(L) 178 224    -Follow-up per GI services 9.  Acute hypoxic respiratory failure.  Extubated 01/06/2020. Discussed importance of using incentive spirometer. Wean off O2.  10.  Metastatic recurrent melanoma/left upper lobe lesion, mediastinal adenopathy.  Status post 4 cycles ipo.nivo.  Follow-up outpatient             -currently on oxygen. Wean to off as able 10.  COVID-19 infection.  Appeared incidental.  Isolation completed. 11.  Recurrent low-grade urothelial carcinoma.  Status post TURBT x3.  Follow-up outpatient.   12.  Elevated LFTs/hepatic cirrhosis.  Markedly improved.  Continue lactulose.  Holding Lasix/Spiro due to AKI.  Monitor LFTs as needed 13.  AKI.  Status post solitary kidney status post nephrectomy due to [place]. Encouraged 6-8 glasses of water per day. Cr reviewed and has normalized.  BMP Latest Ref Rng & Units 01/21/2021 01/18/2021 01/13/2021  Glucose 70 - 99 mg/dL 149(H) 175(H) 126(H)  BUN 8 - 23 mg/dL 13 12 20   Creatinine 0.61 - 1.24 mg/dL 1.23 1.17 1.38(H)  BUN/Creat Ratio 10 - 24 - - -  Sodium 135 - 145 mmol/L 143 142 145  Potassium 3.5 - 5.1 mmol/L 3.8 3.9 4.2  Chloride 98 - 111 mmol/L 105 109 115(H)  CO2 22 - 32 mmol/L 24 24 22   Calcium 8.9 - 10.3 mg/dL 8.6(L) 8.4(L) 7.7(L)   Improving  14.  Diabetes mellitus.  Hemoglobin A1c 7.6.  SSI. Provided dietary education.             CBG (last 3)  Recent Labs    01/26/21 1647 01/26/21 2046 01/27/21 0535  GLUCAP 114* 128* 99  Commended on improvements in CBG control.   15.  Hypertension.  Well controlled, continue Norvasc 10 mg daily.  Monitor with increased mobility Vitals:   01/26/21 2006 01/27/21 0340  BP: 123/60 125/76  Pulse: 89 90  Resp: 20 17  Temp: 98.5 F (36.9 C) 97.8 F (36.6 C)  SpO2: 95% 93%    16.  Hyperlipidemia. continue Lipitor resumed 10 mg daily 17. Obesity BMI 30.25-->28.80: provided dietary education. 18. Diarrhea: decrease lactulose to every other  day  19. Mild oropharyngeal dysphagia: continue SLP 20. [place] extremity edema: lasix 10mg  on 1/18. Repeat creatinine stable, discussed with patient. Vascular ultrasound obtained, reviewed, and negative.  21. Diabetic peripheral neuropathy: discussed [place] outpatient. Will minimize any medications that can impact his cognition 22. Insomnia: continue melatonin to 6mg , improved 23. Impaired cognition: continue memory notebook.     LOS: 15 days A FACE TO FACE EVALUATION WAS PERFORMED  Birtie Fellman P [place] [facility] 01/27/2021, 11:09 AM

## 2021-01-27 NOTE — Progress Notes (Signed)
Speech Language Pathology Daily Session Note  Patient Details  Name: Dalton Miller MRN: [id number] Date of Birth: 1945-11-27  Today's Date: 01/27/2021 SLP Individual Time: 0733-0820 SLP Individual Time Calculation (min): 47 min  Short Term Goals: Week 2: SLP Short Term Goal 1 (Week 2): Pt will demonstrate alertness and sustained attention during functional task for 10-15 minutes with min A verbal cues in 75% of opportunties. SLP Short Term Goal 2 (Week 2): Pt will demonstrate basic to mildly complex problem solving skills in fucntional tasks with mod A verbal cues. SLP Short Term Goal 3 (Week 2): Pt will demonstrate self monitoring of functional errors in problem solving tasks with mod A multimodal cues. SLP Short Term Goal 4 (Week 2): Pt will demonstrate recall of daily and novel information with min A verbal cues to utilize compensatory aids. SLP Short Term Goal 5 (Week 2): Pt will scan left of midline in functional tasks to locate objects with 70% accuracy given mod A multimodal cues. SLP Short Term Goal 6 (Week 2): Pt will demonstarte use of swallow strategies on current diet reagular textures and thin liquids via provale cup with minimal overt s/s aspiration and min A verbal cues.  Skilled Therapeutic Interventions:Skilled ST services focused on swallow and cognitive skills. SLP order pt's breakfast tray with pt via phone since the order was not placed. SLP started memory notebook and provided education to pt. SLP recorded in memory notebook and added anchor on left side (yellow highlighter marker) to aid in left scanning past midline, pt required max A fading to mod A verbal cues to read words on far left side of the page. Pt was able to recall swallow strategies with supervision A verbal cues. Pt was able to utilize swallow strategies when consuming thin liquids via cup with supervision A verbal cues and no overt s/s aspiration. Pt participated in reassessment of cognitive skills utilizing  SLUMS, pt scored 16/25 (n=>25 due to education) with improved but continued deficits in attention, memory, problem solving and left inattention. Pt demonstrated improvement in marking numbers on the left side of the clock compare to on evaluation date only listing numbers on right side of clock. SLP set up breakfast tray when it arrived and notified nursing staff to assist. Pt was left with call bell within reach and recommend to continue ST services.      Pain Pain Assessment Pain Score: 0-No pain  Therapy/Group: Individual Therapy  Corine Solorio  [facility] 01/27/2021, 8:58 AM

## 2021-01-27 NOTE — Progress Notes (Signed)
Patient ID: Dalton Miller, male   DOB: [birth year], 76 y.o.   MRN: [id number]  Met with pt to update team conference meeting goals for discharge on Friday. Daughter in-law was in for education yesterday and both feel comfortable with care needs and discharge Friday.

## 2021-01-27 NOTE — Progress Notes (Signed)
Physical Therapy Session Note  Patient Details  Name: Dalton Miller MRN: [id number] Date of Birth: 1945-10-11  Today's Date: 01/27/2021 PT Individual Time: 4481-8563 PT Individual Time Calculation (min): 40 min   Short Term Goals: Week 2:  PT Short Term Goal 1 (Week 2): STG = LTG due ELOS  Skilled Therapeutic Interventions/Progress Updates:     Pt supine in bed to start with lunch tray in front of him, mostly untouched. Pt reports lack of appetite and is agreeable to PT tx. Denies pain but reports ongoing cough that hs been present for the past ~3 days. Messaged team via secure chat (LPN and MD) to make them aware. Retrieved disposable scrubs from utility closet and then assist pt to EOB with supervision assist with HOB slightly elevated and use of bed rail. Donned tennis shoes with totalA for time and required mod assist for pulling pants over hips in standing with minA needed for balance due to posterior lean. After this, pt reporting urge for to void - provided urinal due to urgency and pt was continent of bladder in standing with minA for balance and assist for managing urinal. Pt then reporting need for BM. Ambulated with CGA and RW within his room with VC for safety awareness, especially while turning to sit to toilet. Required assist for LB dressing and then pt was continent of small BM - both charted in flowsheets. Pt noted to have prior urinary incontinence in brief and also BM incontinence. Pt required maxA for pericare for thoroughness and totalA for brief change. Applied barrier cream to bottom due to stage 2 skin breakdown. Pt then ambulated to w/c wihthin his room with CGA and RW. Performed sensory, muscle, and visual assessment which will be documented in his DC summary tomorrow. Pt noted to have significant improvement in LLE strength compared to date of evaluation. Pt concluded session seated in w/c, 1/2 lap tray supporting LUE, safety belt alarm on, all needs within reach. Plan for  family education tomorrow with this son (daughter in law has already received family training).  Therapy Documentation Precautions:  Precautions Precautions: Fall Precaution Comments: L inattention; L hemi; post covid; recent R shoulder arthroscopy and debridement (12/17/20) Restrictions Weight Bearing Restrictions: No General:    Therapy/Group: Individual Therapy  Dalton Miller 01/27/2021, 7:21 AM

## 2021-01-28 ENCOUNTER — Encounter (HOSPITAL_COMMUNITY): Payer: No Typology Code available for payment source | Admitting: Occupational Therapy

## 2021-01-28 ENCOUNTER — Inpatient Hospital Stay (HOSPITAL_COMMUNITY): Payer: No Typology Code available for payment source

## 2021-01-28 ENCOUNTER — Other Ambulatory Visit (HOSPITAL_COMMUNITY): Payer: Self-pay

## 2021-01-28 LAB — GLUCOSE, CAPILLARY
Glucose-Capillary: 90 mg/dL (ref 70–99)
Glucose-Capillary: 139 mg/dL — ABNORMAL HIGH (ref 70–99)
Glucose-Capillary: 99 mg/dL (ref 70–99)
Glucose-Capillary: 126 mg/dL — ABNORMAL HIGH (ref 70–99)

## 2021-01-28 MED ORDER — PANTOPRAZOLE SODIUM 40 MG PO TBEC: 40.0000 mg | DELAYED_RELEASE_TABLET | Freq: Two times a day (BID) | ORAL | 0 refills | Status: DC

## 2021-01-28 MED ORDER — PANTOPRAZOLE SODIUM 40 MG PO TBEC
40.0000 mg | DELAYED_RELEASE_TABLET | Freq: Two times a day (BID) | ORAL | 0 refills | Status: DC
  Filled 2021-01-28: qty 60, 30d supply, fill #0

## 2021-01-28 MED ORDER — ADULT MULTIVITAMIN W/MINERALS CH: 1.0000 | ORAL_TABLET | Freq: Every day | ORAL | Status: DC

## 2021-01-28 MED ORDER — MELATONIN 3 MG PO TABS: 6.0000 mg | ORAL_TABLET | Freq: Every day | ORAL | 0 refills | Status: DC

## 2021-01-28 MED ORDER — LORATADINE 10 MG PO TABS: 10.0000 mg | ORAL_TABLET | Freq: Every day | ORAL | Status: DC

## 2021-01-28 MED ORDER — MELATONIN 3 MG PO TABS
6.0000 mg | ORAL_TABLET | Freq: Every day | ORAL | 0 refills | Status: DC
  Filled 2021-01-28: qty 30, 15d supply, fill #0

## 2021-01-28 MED ORDER — AMLODIPINE BESYLATE 10 MG PO TABS
10.0000 mg | ORAL_TABLET | Freq: Every day | ORAL | 0 refills | Status: DC
Start: 2021-01-28 — End: 2021-01-28
  Filled 2021-01-28: qty 30, 30d supply, fill #0

## 2021-01-28 MED ORDER — RIFAXIMIN 550 MG PO TABS
550.0000 mg | ORAL_TABLET | Freq: Two times a day (BID) | ORAL | 0 refills | Status: DC
  Filled 2021-01-28: qty 60, 30d supply, fill #0

## 2021-01-28 MED ORDER — LACTULOSE ENCEPHALOPATHY 10 GM/15ML PO SOLN
10.0000 g | ORAL | 0 refills | Status: DC
  Filled 2021-01-28: qty 236, 30d supply, fill #0

## 2021-01-28 MED ORDER — ASPIRIN EC 81 MG PO TBEC: 81.0000 mg | DELAYED_RELEASE_TABLET | Freq: Every day | ORAL | 1 refills | Status: DC

## 2021-01-28 MED ORDER — AMLODIPINE BESYLATE 5 MG PO TABS
5.0000 mg | ORAL_TABLET | Freq: Every day | ORAL | Status: DC
  Administered 2021-01-29: 5 mg via ORAL
  Filled 2021-01-28: qty 1

## 2021-01-28 MED ORDER — ATORVASTATIN CALCIUM 10 MG PO TABS: 10.0000 mg | ORAL_TABLET | Freq: Every day | ORAL | 0 refills | Status: DC

## 2021-01-28 MED ORDER — GUAIFENESIN-CODEINE 100-10 MG/5ML PO SOLN: 5.0000 mL | Freq: Four times a day (QID) | ORAL | Status: DC | PRN

## 2021-01-28 MED ORDER — ACETAMINOPHEN 325 MG PO TABS: 650.0000 mg | ORAL_TABLET | Freq: Four times a day (QID) | ORAL | Status: DC | PRN

## 2021-01-28 MED ORDER — ATORVASTATIN CALCIUM 10 MG PO TABS
10.0000 mg | ORAL_TABLET | Freq: Every day | ORAL | 0 refills | Status: DC
  Filled 2021-01-28: qty 30, 30d supply, fill #0

## 2021-01-28 MED ORDER — AMLODIPINE BESYLATE 10 MG PO TABS: 10.0000 mg | ORAL_TABLET | Freq: Every day | ORAL | 0 refills | Status: DC

## 2021-01-28 MED ORDER — LACTULOSE ENCEPHALOPATHY 10 GM/15ML PO SOLN: 10.0000 g | ORAL | 0 refills | Status: DC

## 2021-01-28 MED ORDER — RIFAXIMIN 550 MG PO TABS: 550.0000 mg | ORAL_TABLET | Freq: Two times a day (BID) | ORAL | 0 refills | Status: DC

## 2021-01-28 NOTE — Progress Notes (Signed)
Occupational Therapy Discharge Summary  Patient Details  Name: Dalton Miller MRN: [id number] Date of Birth: 03/15/1945  Today's Date: 01/28/2021 OT Individual Time: 4098-1191 OT Individual Time Calculation (min): 75 min    Patient has met 13 of 15 long term goals due to improved activity tolerance, improved balance, postural control, ability to compensate for deficits, functional use of  LEFT upper and LEFT lower extremity, improved attention, improved awareness, and improved coordination. Pt has also increased LUE strength and motor control and increased from 2-/5 to 3+/5 MMT.  Pt exhibits increased awareness into deficits from time of evaluation however still requires cues to self correct during functional tasks.  Patient to discharge at [facility] Assist level including grooming and hygiene at sinkside, UB/LB dressing at EOB, and bathing at shower level using tub bench.   Patient's care partner is independent to provide the necessary physical and cognitive assistance at discharge. Pts son and dtr in law were educated on assisting pt with hemitechniques during self care and attending to left side of body and environment using lighthouse technique and verbal cueing.  They were also educated on assist techniques during functional transfers including 3 in 1 commode transfer and shower bench transfer at tub shower level.  Both verbalized and demonstrated good carryover of all skills; feel that pt is safe to return home with 24 hour supervision and min assist.  Reasons goals not met: Pt requires min assist to thread sleeve of shirt secondary to persistent left neglect despite cues and training.  Pt also requires min assist with oral hygiene at sink due to continued weakness in left hand and pt having difficulty implementing hemi strategies to open toothpaste and other adl item containers.    Recommendation:  Patient will benefit from ongoing skilled OT services in outpatient setting to continue to advance  functional skills in the area of BADL, IADLs, and neuro re-ed of LUE, left attention and visual scanning.  Equipment: Bedside commode  Reasons for discharge: treatment goals met and discharge from hospital  Patient/family agrees with progress made and goals achieved: Yes  OT Discharge Precautions/Restrictions  Precautions Precautions: Fall Precaution Comments: L inattention; L hemi; post covid; recent R shoulder arthroscopy and debridement (12/17/20) Restrictions Weight Bearing Restrictions: No Pain Pain Assessment Pain Scale: 0-10 Pain Score: 0-No pain ADL ADL Eating: Supervision/safety Where Assessed-Eating: Wheelchair Grooming: Minimal assistance Where Assessed-Grooming: Standing at sink Upper Body Bathing: Supervision/safety Where Assessed-Upper Body Bathing: Shower Lower Body Bathing: Contact guard Where Assessed-Lower Body Bathing: Shower Upper Body Dressing: Minimal assistance Where Assessed-Upper Body Dressing: Wheelchair Lower Body Dressing: Minimal assistance Where Assessed-Lower Body Dressing: Edge of bed Toileting: Minimal assistance Where Assessed-Toileting: Bedside Commode Toilet Transfer: Therapist, music Method: Counselling psychologist: Bedside commode Tub/Shower Transfer: Metallurgist Method: Optometrist: Facilities manager: Not assessed ADL Comments: Patient declined bathing/dressing at time of evaluation. Vision Baseline Vision/History: 1 Wears glasses Patient Visual Report: No change from baseline Vision Assessment?: Yes Eye Alignment: Within Functional Limits Ocular Range of Motion: Restricted on the left Alignment/Gaze Preference: Gaze right;Head turned Tracking/Visual Pursuits: Requires cues, head turns, or add eye shifts to track;Decreased smoothness of horizontal tracking Saccades: Undershoots;Decreased speed of saccadic movement Convergence:  Impaired - to be further tested in functional context Visual Fields: Left visual field deficit Depth Perception: Undershoots Perception  Perception: Impaired Inattention/Neglect: Does not attend to left side of body;Does not attend to left visual field Praxis Praxis: Impaired Praxis Impairment Details:  Motor planning Cognition Overall Cognitive Status: Impaired/Different from baseline Arousal/Alertness: Awake/alert Orientation Level: Oriented X4 Year: 2023 Month: January Day of Week: Correct Attention: Sustained;Selective;Alternating Sustained Attention: Appears intact Selective Attention: Impaired Selective Attention Impairment: Functional basic Alternating Attention: Impaired Alternating Attention Impairment: Functional basic Memory: Impaired Memory Impairment: Retrieval deficit Immediate Memory Recall: Sock;Blue;Bed Memory Recall Sock: Without Cue Memory Recall Blue: Without Cue Memory Recall Bed: Without Cue Awareness: Impaired Awareness Impairment: Emergent impairment Problem Solving: Impaired Problem Solving Impairment: Verbal complex;Functional complex;Verbal basic;Functional basic Safety/Judgment: Impaired Sensation Sensation Light Touch: Appears Intact Hot/Cold: Appears Intact Proprioception: Impaired Detail Proprioception Impaired Details: Impaired LUE Stereognosis: Not tested Coordination Gross Motor Movements are Fluid and Coordinated: No Fine Motor Movements are Fluid and Coordinated: No Coordination and Movement Description: L hemi/inattention and global deconditioning/weakness post covid and hospitalization Finger Nose Finger Test: Slight deficits on R; uanble to assess on L 2/2 L hemi Motor  Motor Motor: Hemiplegia;Abnormal postural alignment and control;Motor apraxia Motor - Discharge Observations: L hemi and L inattention that has improved since date of evaluation. Mobility  Bed Mobility Bed Mobility: Rolling Right;Rolling Left;Supine to Sit;Sit to  Supine Rolling Right: Supervision/verbal cueing Rolling Left: Supervision/Verbal cueing Supine to Sit: Contact Guard/Touching assist Sit to Supine: Supervision/Verbal cueing Transfers Sit to Stand: Contact Guard/Touching assist Stand to Sit: Contact Guard/Touching assist  Trunk/Postural Assessment  Cervical Assessment Cervical Assessment: Exceptions to [facility] Thoracic Assessment Thoracic Assessment: Exceptions to [facility] (forward head) Lumbar Assessment Lumbar Assessment: Exceptions to [facility] (rounded shoulders) Postural Control Postural Control: Deficits on evaluation (posterior pelvic tilt) Righting Reactions: delayed, especially on L  Balance Balance Balance Assessed: Yes Static Sitting Balance Static Sitting - Balance Support: No upper extremity supported Static Sitting - Level of Assistance: 5: Stand by assistance Dynamic Sitting Balance Dynamic Sitting - Balance Support: No upper extremity supported;Feet supported Dynamic Sitting - Level of Assistance: 4: Min Insurance risk surveyor Standing - Balance Support: No upper extremity supported Static Standing - Level of Assistance: 5: Stand by assistance Dynamic Standing Balance Dynamic Standing - Balance Support: No upper extremity supported Dynamic Standing - Level of Assistance: 4: Min assist;3: Mod assist Extremity/Trunk Assessment RUE Assessment RUE Assessment: Exceptions to [facility] Passive Range of Motion (PROM) Comments: WFL Active Range of Motion (AROM) Comments: Limited ROM at shoulder (Hx recent shoulder surgery); WFL at elbow, wrist and digits General Strength Comments: MMT 4/5 grossly LUE Assessment LUE Assessment: Exceptions to [facility] Active Range of Motion (AROM) Comments: ~90 degrees active shoulder flexion/abduction;elbow, forearm, wrist WFL, digits and thumb partial ROM General Strength Comments: Shoulder 3- to 3/5 ; elbow 3+/5; FA 3+/5, wrist 3+/5; digits 2/5; thumb 1/5 LUE Body System: Neuro Brunstrum  levels for arm and hand: Arm;Hand Brunstrum level for arm: Stage IV Movement is deviating from synergy Brunstrum level for hand: Stage III Synergies performed voluntarily   Ezekiel Slocumb 01/28/2021, 5:55 PM

## 2021-01-28 NOTE — Plan of Care (Signed)
Problem: RH Problem Solving Goal: LTG Patient will demonstrate problem solving for (SLP) Description: LTG:  Patient will demonstrate problem solving for basic/complex daily situations with cues  (SLP) Outcome: Completed/Met   Problem: RH Memory Goal: LTG Patient will use memory compensatory aids to (SLP) Description: LTG:  Patient will use memory compensatory aids to recall biographical/new, daily complex information with cues (SLP) Outcome: Completed/Met   Problem: RH Attention Goal: LTG Patient will demonstrate this level of attention during functional activites (SLP) Description: LTG:  Patient will will demonstrate this level of attention during functional activites (SLP) Outcome: Completed/Met   Problem: RH Awareness Goal: LTG: Patient will demonstrate awareness during functional activites type of (SLP) Description: LTG: Patient will demonstrate awareness during functional activites type of (SLP) Outcome: Completed/Met   Problem: RH Pre-functional/Other (Specify) Goal: RH LTG SLP (Specify) 1 Description: RH LTG SLP (Specify) 1 Outcome: Completed/Met   Problem: RH Swallowing Goal: LTG Patient will consume least restrictive diet using compensatory strategies with assistance (SLP) Description: LTG:  Patient will consume least restrictive diet using compensatory strategies with assistance (SLP) Outcome: Completed/Met Goal: LTG Patient will participate in dysphagia therapy to increase swallow function with assistance (SLP) Description: LTG:  Patient will participate in dysphagia therapy to increase swallow function with assistance (SLP) Outcome: Completed/Met Goal: LTG Pt will demonstrate functional change in swallow as evidenced by bedside/clinical objective assessment (SLP) Description: LTG: Patient will demonstrate functional change in swallow as evidenced by bedside/clinical objective assessment (SLP) Outcome: Completed/Met

## 2021-01-28 NOTE — Progress Notes (Signed)
PROGRESS NOTE   Subjective/Complaints: No new complaints  Still with cough, Robitussin helped  ROS: denies CP, SOB, N/V, +peripheral neuropathy, wrist pain improved, +thigh pain, +left lower extremity swelling, +insomnia- improved  Objective:   No results found. No results for input(s): WBC, HGB, HCT, PLT in the last 72 hours.  No results for input(s): NA, K, CL, CO2, GLUCOSE, BUN, CREATININE, CALCIUM in the last 72 hours.     Intake/Output Summary (Last 24 hours) at 01/28/2021 1211 Last data filed at 01/27/2021 1731 Gross per 24 hour  Intake 118 ml  Output 300 ml  Net -182 ml        Physical Exam: Vital Signs Blood pressure 125/68, pulse 94, temperature 98.4 F (36.9 C), temperature source Oral, resp. rate 18, height 5\' 7"  (1.702 m), weight 70.9 kg, SpO2 92 %. Gen: no distress, normal appearing HEENT: oral mucosa pink and moist, NCAT Cardio: Reg rate Chest: normal effort, normal rate of breathing Abd: soft, non-distended Ext: no edema Psych: pleasant, normal affect Skin: intact   Neurological:     Mental Status: He is alert.     Comments: Patient is alert.  Makes eye contact with examiner.  Provides name and age but limited medical historian. Normal language. Speech sl dysarthric. Follows simple commands. Left central 7 and tongue deviation. Right gaze preference.  Left sided inattention. LUE 2 to 2+/5. LLE 3 to 3+/5. RUE and RLE 4 to 4+/5. No obvious sensory deficits. No limb ataxia apparent. Normal resting tone and DTR's 1+.     Assessment/Plan: 1. Functional deficits which require 3+ hours per day of interdisciplinary therapy in a comprehensive inpatient rehab setting. Physiatrist is providing close team supervision and 24 hour management of active medical problems listed below. Physiatrist and rehab team continue to assess barriers to discharge/monitor patient progress toward functional and medical  goals  Care Tool:  Bathing  Bathing activity did not occur: Refused Body parts bathed by patient: Left arm, Chest, Abdomen, Face   Body parts bathed by helper: Right arm, Left lower leg, Right lower leg, Left upper leg, Right upper leg, Buttocks, Front perineal area     Bathing assist Assist Level: Maximal Assistance - Patient 24 - 49%     Upper Body Dressing/Undressing Upper body dressing Upper body dressing/undressing activity did not occur (including orthotics): Refused What is the patient wearing?: Pull over shirt    Upper body assist Assist Level: Moderate Assistance - Patient 50 - 74%    Lower Body Dressing/Undressing Lower body dressing    Lower body dressing activity did not occur: Refused What is the patient wearing?: Pants     Lower body assist Assist for lower body dressing: Maximal Assistance - Patient 25 - 49%     Toileting Toileting    Toileting assist Assist for toileting: Moderate Assistance - Patient 50 - 74%     Transfers Chair/bed transfer  Transfers assist  Chair/bed transfer activity did not occur: Safety/medical concerns  Chair/bed transfer assist level: Minimal Assistance - Patient > 75%     Locomotion Ambulation   Ambulation assist   Ambulation activity did not occur: Safety/medical concerns  Assist level: Minimal Assistance - Patient >  75% Assistive device: Walker-rolling Max distance: 242ft   Walk 10 feet activity   Assist  Walk 10 feet activity did not occur: Safety/medical concerns  Assist level: Minimal Assistance - Patient > 75% Assistive device: Walker-rolling   Walk 50 feet activity   Assist Walk 50 feet with 2 turns activity did not occur: Safety/medical concerns  Assist level: Minimal Assistance - Patient > 75% Assistive device: Walker-rolling    Walk 150 feet activity   Assist Walk 150 feet activity did not occur: Safety/medical concerns  Assist level: Minimal Assistance - Patient > 75% Assistive  device: Walker-rolling    Walk 10 feet on uneven surface  activity   Assist Walk 10 feet on uneven surfaces activity did not occur: Safety/medical concerns         Wheelchair     Assist Is the patient using a wheelchair?: Yes Type of Wheelchair: Manual Wheelchair activity did not occur:  (fatigue at end of session)  Wheelchair assist level: Dependent - Patient 0%      Wheelchair 50 feet with 2 turns activity    Assist        Assist Level: Dependent - Patient 0%   Wheelchair 150 feet activity     Assist      Assist Level: Dependent - Patient 0%   Blood pressure 125/68, pulse 94, temperature 98.4 F (36.9 C), temperature source Oral, resp. rate 18, height 5\' 7"  (1.702 m), weight 70.9 kg, SpO2 92 %.    Medical Problem List and Plan: 1. Functional deficits  disconjugate gaze with altered mental status/acute hepatic metabolic encephalopathy/multifactorial secondary to right MCA scattered infarcts             -patient may shower             -ELOS/Goals: 14-17 days, min assist goals with PT, OT, SLP  Continue CIR  HFU scheduled.   Updated daughter  2.  Impaired mobility -DVT/anticoagulation:  Mechanical: Antiembolism stockings, thigh (TED hose) Bilateral lower extremities             -antiplatelet therapy: continue Aspirin 81 mg daily 3. Thigh pain: add kpad. Tylenol held due to elevated LFTs 4. Mood: Provide emotional support             -antipsychotic agents: N/A 5. Neuropsych: This patient is capable of making decisions on his own behalf. 6. Skin/Wound Care: Routine skin checks 7. Fluids/Electrolytes/Nutrition: Routine in and outs with follow-up chemistries  8.  Acute blood loss anemia due to upper GI bleed esophageal varices.  Status post banding x4 12/30/2020.   -continue Protonix twice daily.   CBC Latest Ref Rng & Units 01/13/2021 01/10/2021 01/08/2021  WBC 4.0 - 10.5 K/uL 11.8(H) 11.5(H) 12.0(H)  Hemoglobin 13.0 - 17.0 g/dL 9.9(L) 8.5(L) 9.5(L)   Hematocrit 39.0 - 52.0 % 31.4(L) 26.6(L) 30.8(L)  Platelets 150 - 400 K/uL 143(L) 178 224    -Follow-up per GI services 9.  Acute hypoxic respiratory failure.  Extubated 01/06/2020. Discussed importance of using incentive spirometer. Wean off O2.  10.  Metastatic recurrent melanoma/left upper lobe lesion, mediastinal adenopathy.  Status post 4 cycles ipo.nivo.  Follow-up outpatient             -currently on oxygen. Wean to off as able 10.  COVID-19 infection.  Appeared incidental.  Isolation completed. 11.  Recurrent low-grade urothelial carcinoma.  Status post TURBT x3.  Follow-up outpatient.   12.  Elevated LFTs/hepatic cirrhosis.  Markedly improved.  Continue lactulose.  Holding Lasix/Spiro  due to AKI. Monitor LFTs as needed 13.  AKI.  Status post solitary kidney status post nephrectomy due to [place]. Encouraged 6-8 glasses of water per day. Cr reviewed and has normalized.  BMP Latest Ref Rng & Units 01/21/2021 01/18/2021 01/13/2021  Glucose 70 - 99 mg/dL 149(H) 175(H) 126(H)  BUN 8 - 23 mg/dL 13 12 20   Creatinine 0.61 - 1.24 mg/dL 1.23 1.17 1.38(H)  BUN/Creat Ratio 10 - 24 - - -  Sodium 135 - 145 mmol/L 143 142 145  Potassium 3.5 - 5.1 mmol/L 3.8 3.9 4.2  Chloride 98 - 111 mmol/L 105 109 115(H)  CO2 22 - 32 mmol/L 24 24 22   Calcium 8.9 - 10.3 mg/dL 8.6(L) 8.4(L) 7.7(L)   Improving  14.  Diabetes mellitus.  Hemoglobin A1c 7.6.  SSI. Provided dietary education.             CBG (last 3)  Recent Labs    01/27/21 2118 01/28/21 0641 01/28/21 1158  GLUCAP 78 90 139*  Commended on improvements in CBG control.   15.  Hypertension.  Well controlled, decrease Norvasc to 5 mg daily.  Monitor with increased mobility Vitals:   01/27/21 1943 01/28/21 0313  BP: (!) 122/52 125/68  Pulse: 80 94  Resp: 18 18  Temp: 98.2 F (36.8 C) 98.4 F (36.9 C)  SpO2: 93% 92%    16.  Hyperlipidemia. continue Lipitor resumed 10 mg daily 17. Obesity BMI 30.25-->28.80: provided dietary education. 18.  Diarrhea: decrease lactulose to every other day  19. Mild oropharyngeal dysphagia: continue SLP 20. Lowe extremity edema: lasix 10mg  on 1/18. Repeat creatinine stable, discussed with patient. Vascular ultrasound obtained, reviewed, and negative.  21. Diabetic peripheral neuropathy: discussed [place] outpatient. Will minimize any medications that can impact his cognition 22. Insomnia: continue melatonin to 6mg , improved 23. Impaired cognition: continue memory notebook.  24. Cough: continue robitussin    LOS: 16 days A FACE TO FACE EVALUATION WAS PERFORMED  Martha Clan P Velinda Wrobel 01/28/2021, 12:11 PM

## 2021-01-28 NOTE — Progress Notes (Signed)
Physical Therapy Session Note  Patient Details  Name: Dalton Miller MRN: [id number] Date of Birth: [birth year]  Today's Date: 01/28/2021 PT Individual Time: 1015-1100 PT Individual Time Calculation (min): 45 min   Short Term Goals: Week 2:  PT Short Term Goal 1 (Week 2): STG = LTG due ELOS  Skilled Therapeutic Interventions/Progress Updates:     Pt supine in bed at start of session. Pt's daughter in law and son at the bedside for family education/training. Pt agreeable to PT tx. He continues to report some coughing, LPN notified at end of session. Pt completed supine<>sit with CGA with HOB flat, effortful. Discussed energy levels with family, and that pt typically requires more time and assist first thing out of the bed. Daughter in law assisted with donning pants/shoes. Sit<>stand to RW with CGA and minA for standing balance while pulling pants over hips in standing. Pt then reporting need to void. Ambulated with CGA and RW to the bathroom and pt required assist for managing LB dressing to sit on toilet. Pt continent of bladder and small BM. Pt required totalA for posterior pericare for cleansing - notified family of need for assist of toileting tasks. Pt then ambulated to hallway, ~51ft, with CGA and RW and then transported downstairs to 60M ortho rehab gym for time. Pt completed car transfer with the son assisting and PT supervising, completed at [facility] level for managing BLE only. Son then assist pt with ambulating ~132ft within rehab hallways with RW and CGA with PT providing supervision. Educated on verbal cueing for increasing L step height, forward gaze, and keeping body within walker frame. Pt then transported back upstairs to his room. Remained seated in w/c with safety belt alarm on, family remaining at bedside. All questions/concerns addressed from family. All members and patient feel confident and comfortable on upcoming DC home tomorrow.   Therapy Documentation Precautions:   Precautions Precautions: Fall Precaution Comments: L inattention; L hemi; post covid; recent R shoulder arthroscopy and debridement (12/17/20) Restrictions Weight Bearing Restrictions: No General:    Therapy/Group: Individual Therapy  [name] 01/28/2021, 7:38 AM

## 2021-01-28 NOTE — Discharge Summary (Signed)
Physical Therapy Discharge Summary  Patient Details  Name: Dalton Miller MRN: [id number] Date of Birth: 1945-12-02  Patient has met 72 of 10 long term goals due to improved activity tolerance, improved balance, improved postural control, increased strength, ability to compensate for deficits, functional use of  left upper extremity and left lower extremity, improved attention, and improved awareness.  Patient to discharge at an ambulatory level  CGA .   Patient's care partner is independent to provide the necessary physical and cognitive assistance at discharge. Family education/training completed with both daughter-in-law and the son on separate occasions. Both members felt confident and demonstrated appropriate handling and guarding of the patient during functional mobility tasks. They were educated on fall prevention, home safety, deficits related to his CVA, and recommendations for follow up therapy. All questions/concerns were addressed.   Reasons goals not met: n/a  Recommendation:  Patient will benefit from ongoing skilled PT services in outpatient setting to continue to advance safe functional mobility, address ongoing impairments in LLE weakness, L inattention, dynamic standing balance, gait training, functional mobility, safety awarenes, and minimize fall risk.  Equipment: RW with saddle splint for paretic LUE  Reasons for discharge: treatment goals met and discharge from hospital  Patient/family agrees with progress made and goals achieved: Yes  PT Discharge Precautions/Restrictions Precautions Precautions: Fall Precaution Comments: L inattention; L hemi; post covid; recent R shoulder arthroscopy and debridement (12/17/20) Restrictions Weight Bearing Restrictions: No Pain Interference Pain Interference Pain Effect on Sleep: 1. Rarely or not at all Pain Interference with Therapy Activities: 1. Rarely or not at all Pain Interference with Day-to-Day Activities: 1. Rarely or not at  all Vision/Perception  Vision - History Ability to See in Adequate Light: 0 Adequate Vision - Assessment Eye Alignment: Within Functional Limits Ocular Range of Motion: Restricted on the left Alignment/Gaze Preference: Gaze right;Head turned Tracking/Visual Pursuits: Requires cues, head turns, or add eye shifts to track;Decreased smoothness of horizontal tracking Perception Perception: Impaired Inattention/Neglect: Does not attend to left side of body;Does not attend to left visual field Praxis Praxis: Impaired Praxis Impairment Details: Motor planning Praxis-Other Comments: improved since date of evaluation  Cognition Overall Cognitive Status: Impaired/Different from baseline Arousal/Alertness: Awake/alert Orientation Level: Oriented X4 Year: 2023 Month: January Day of Week: Correct Memory: Impaired Memory Impairment: Retrieval deficit Awareness: Impaired Awareness Impairment: Emergent impairment Problem Solving: Impaired Problem Solving Impairment: Verbal complex;Functional complex;Verbal basic;Functional basic Safety/Judgment: Impaired (L inattention) Sensation Sensation Light Touch: Appears Intact Hot/Cold: Not tested Proprioception: Impaired by gross assessment Stereognosis: Not tested Additional Comments: LLE impaired with functional movements Coordination Gross Motor Movements are Fluid and Coordinated: No Fine Motor Movements are Fluid and Coordinated: No Coordination and Movement Description: L hemi/inattention and global deconditioning/weakness post covid and hospitalization Motor  Motor Motor: Hemiplegia;Abnormal postural alignment and control;Motor apraxia Motor - Discharge Observations: L hemi and L inattention that has improved since date of evaluation.  Mobility Bed Mobility Bed Mobility: Rolling Right;Rolling Left;Supine to Sit;Sit to Supine Rolling Right: Supervision/verbal cueing Rolling Left: Supervision/Verbal cueing Supine to Sit: Contact  Guard/Touching assist Sit to Supine: Supervision/Verbal cueing Transfers Transfers: Sit to Stand;Stand to Sit;Squat Pivot Transfers Sit to Stand: Contact Guard/Touching assist Stand to Sit: Contact Guard/Touching assist Stand Pivot Transfers: Contact Guard/Touching assist Stand Pivot Transfer Details: Verbal cues for gait pattern;Verbal cues for precautions/safety;Verbal cues for sequencing;Verbal cues for technique;Verbal cues for safe use of DME/AE;Tactile cues for weight shifting Transfer (Assistive device): Rolling walker Locomotion  Gait Ambulation: Yes Gait Assistance: Contact Guard/Touching assist Gait  Distance (Feet): 150 Feet Assistive device: Rolling walker Gait Gait: Yes Gait Pattern: Impaired Gait Pattern: Step-through pattern;Decreased stance time - right;Decreased step length - left;Decreased dorsiflexion - left;Decreased weight shift to left;Trunk flexed;Poor foot clearance - left Gait velocity: decreased for age 62 / Additional Locomotion Stairs: Yes Stairs Assistance: Minimal Assistance - Patient > 75% Stair Management Technique: One rail Right Number of Stairs: 8 Height of Stairs: 6 Wheelchair Mobility Wheelchair Mobility: No  Trunk/Postural Assessment  Cervical Assessment Cervical Assessment: Exceptions to [facility] (forward head) Thoracic Assessment Thoracic Assessment: Exceptions to [facility] (rounded shoulders with scapular protraction on L > R) Lumbar Assessment Lumbar Assessment: Exceptions to [facility] (posterior pelvic tilt) Postural Control Postural Control: Deficits on evaluation Righting Reactions: delayed, especially on L  Balance Balance Balance Assessed: Yes Standardized Balance Assessment Standardized Balance Assessment: Berg Balance Test Berg Balance Test Sit to Stand: Able to stand  independently using hands Standing Unsupported: Able to stand 2 minutes with supervision Sitting with Back Unsupported but Feet Supported on Floor or Stool: Able to sit  safely and securely 2 minutes Stand to Sit: Sits safely with minimal use of hands Transfers: Able to transfer with verbal cueing and /or supervision Standing Unsupported with Eyes Closed: Able to stand 10 seconds with supervision Standing Ubsupported with Feet Together: Able to place feet together independently and stand for 1 minute with supervision From Standing, Reach Forward with Outstretched Arm: Reaches forward but needs supervision From Standing Position, Pick up Object from Floor: Able to pick up shoe, needs supervision From Standing Position, Turn to Look Behind Over each Shoulder: Turn sideways only but maintains balance Turn 360 Degrees: Needs close supervision or verbal cueing Standing Unsupported, Alternately Place Feet on Step/Stool: Needs assistance to keep from falling or unable to try Standing Unsupported, One Foot in Front: Needs help to step but can hold 15 seconds Standing on One Leg: Unable to try or needs assist to prevent fall Total Score: 30 Static Sitting Balance Static Sitting - Balance Support: No upper extremity supported Static Sitting - Level of Assistance: 5: Stand by assistance Dynamic Sitting Balance Dynamic Sitting - Balance Support: No upper extremity supported;Feet supported Dynamic Sitting - Level of Assistance: 4: Min assist Static Standing Balance Static Standing - Balance Support: No upper extremity supported Static Standing - Level of Assistance: Other (comment) (CGA) Dynamic Standing Balance Dynamic Standing - Balance Support: No upper extremity supported Dynamic Standing - Level of Assistance: 4: Min assist;3: Mod assist Extremity Assessment      RLE Assessment RLE Assessment: Exceptions to [facility] RLE Strength RLE Overall Strength: Deficits Right Hip Flexion: 4+/5 Right Hip ABduction: 4/5 Right Hip ADduction: 4/5 Right Knee Flexion: 4+/5 Right Knee Extension: 4/5 Right Ankle Dorsiflexion: 4/5 Right Ankle Plantar Flexion: 4+/5 LLE  Assessment LLE Assessment: Exceptions to [facility] LLE Strength LLE Overall Strength: Deficits LLE Overall Strength Comments: grossly 4/5, limited L DF Left Hip Flexion: 4/5 Left Hip ABduction: 4/5 Left Hip ADduction: 4/5 Left Knee Flexion: 4+/5 Left Knee Extension: 4+/5 Left Ankle Dorsiflexion: 4-/5 Left Ankle Plantar Flexion: 4/5    Fowler Antos P Ashlea Dusing PT 01/28/2021, 10:00 AM

## 2021-01-28 NOTE — Progress Notes (Signed)
Physical Therapy Session Note  Patient Details  Name: Dalton Miller MRN: [id number] Date of Birth: [birth year]  Today's Date: 01/28/2021 PT Individual Time: 1445-1515 PT Individual Time Calculation (min): 30 min   Short Term Goals: Week 2:  PT Short Term Goal 1 (Week 2): STG = LTG due ELOS  Skilled Therapeutic Interventions/Progress Updates:     Pt supine in bed, sleeping but awakens to voice. He's awkwardly positioned in bed, slid down to the bottom with feet against footboard. Required modA for scooting up to [facility] with HOB flat. He's agreeable to PT tx but requests to stay in the bed due to fatigue. Spent time educating him on DC planning, home safety, and f/u therapies. He reports he's planning on quitting smoking - provided some education on benefits of smoking cessation and having family members also quit to help ease the transition. Pt completed supine there-ex as follows in the bed:  -LLE heel slides, hip abduction, SLR 1x20 with rest breaks b/w bouts. Pt falling asleep b/w sets, deferred further there-ex or mobility training. Concluded session semi-reclined in bed. LUE supported with pillows, bed alarm on, all needs within reach. He missed 15 minutes of skilled therapy due to fatigue.   Therapy Documentation Precautions:  Precautions Precautions: Fall Precaution Comments: L inattention; L hemi; post covid; recent R shoulder arthroscopy and debridement (12/17/20) Restrictions Weight Bearing Restrictions: No General:     Therapy/Group: Individual Therapy  [name] 01/28/2021, 2:58 PM

## 2021-01-29 LAB — GLUCOSE, CAPILLARY: Glucose-Capillary: 116 mg/dL — ABNORMAL HIGH (ref 70–99)

## 2021-01-29 MED ORDER — AMLODIPINE BESYLATE 5 MG PO TABS: 5.0000 mg | ORAL_TABLET | Freq: Every day | ORAL | 0 refills | Status: DC

## 2021-01-29 NOTE — Progress Notes (Addendum)
Alert and oriented x4, compliant with medication administration, no complaints of pain or discomfort. Laceration to left elbow, healed; Dr. Ranell Patrick notified. Educated daughter Arrie Aran on monitoring skin daily, and to apply barrier cream to buttocks. Daughter verbalized understanding. Medications reviewed and discharge instructions provided by Lauraine Rinne, PA. Patient assisted by staff off unit and into private car with no issues. Patient discharged successfully.

## 2021-01-29 NOTE — Progress Notes (Signed)
Inpatient Rehabilitation Care Coordinator Discharge Note   Patient Details  Name: Dalton Miller MRN: [id number] Date of Birth: 1945-03-22   Discharge location: HOME WITH SON AND DAUGHTER IN-LAW PROVIDING 24/7 CARE  Length of Stay: 17 DAYS  Discharge activity level: CGA-MIN ASSIST LEVEL  Home/community participation: ACTIVE  Patient response YF:VCBSWH Literacy - How often do you need to have someone help you when you read instructions, pamphlets, or other written material from your doctor or pharmacy?: Never  Patient response QP:RFFMBW Isolation - How often do you feel lonely or isolated from those around you?: Never  Services provided included: MD, RD, PT, OT, SLP, RN, CM, TR, Pharmacy, SW  Financial Services:  Financial Services Utilized: [employer]  Choices offered to/list presented to: PT AND DAUGHTER IN-LAW  Follow-up services arranged:  Outpatient, DME, Patient/Family has no preference for HH/DME agencies    Outpatient Servicies: [facility]SP WILL CALL TO SET UP APPOINTMENTS DME : [facility]    Patient response to transportation need: Is the patient able to respond to transportation needs?: Yes In the past 12 months, has lack of transportation kept you from medical appointments or from getting medications?: No In the past 12 months, has lack of transportation kept you from meetings, work, or from getting things needed for daily living?: No    Comments (or additional information):DAUGHTER IN-LAW WAS HERE FOR EDUCATION AND BOTH COMOFORTABLE WITH HIS CARE AND READY FOR [place]. PT REQUESTED TO COME BACK TO [facility] FOR OP DID NOT LIKE THE OP AT [facility]  Patient/Family verbalized understanding of follow-up arrangements:  Yes  Individual responsible for coordination of the follow-up plan: DAWN-DAUGHTER IN-LAW [phone]  Confirmed correct DME delivered: Elease Hashimoto 01/29/2021    Flay Ghosh, Gardiner Rhyme

## 2021-01-29 NOTE — Progress Notes (Signed)
PROGRESS NOTE   Subjective/Complaints: No new complaints  Still with cough, Robitussin helped Skin healed well Provided daughter with list of foods for diabetes  ROS: denies CP, SOB, N/V, +peripheral neuropathy, wrist pain improved, +thigh pain, +left lower extremity swelling, +insomnia- improved  Objective:   DG Chest 2 View  Result Date: 01/28/2021 CLINICAL DATA:  Cough EXAM: CHEST - 2 VIEW COMPARISON:  01/02/2021 FINDINGS: Mild peribronchial thickening and interstitial prominence. Heart is normal size. No confluent opacities or effusions. No acute bony abnormality. IMPRESSION: Mild bronchitic changes. Electronically Signed   By: Rolm Baptise M.D.   On: 01/28/2021 18:18   No results for input(s): WBC, HGB, HCT, PLT in the last 72 hours.  No results for input(s): NA, K, CL, CO2, GLUCOSE, BUN, CREATININE, CALCIUM in the last 72 hours.     Intake/Output Summary (Last 24 hours) at 01/29/2021 1054 Last data filed at 01/29/2021 0808 Gross per 24 hour  Intake 480 ml  Output --  Net 480 ml        Physical Exam: Vital Signs Blood pressure (!) 112/52, pulse 90, temperature 98.6 F (37 C), temperature source Oral, resp. rate 16, height 5\' 7"  (1.702 m), weight 71.1 kg, SpO2 95 %. Gen: no distress, normal appearing HEENT: oral mucosa pink and moist, NCAT Cardio: Reg rate Chest: normal effort, normal rate of breathing Abd: soft, non-distended Ext: no edema Psych: pleasant, normal affect Skin: intact  Neurological:     Mental Status: He is alert.     Comments: Patient is alert.  Makes eye contact with examiner.  Provides name and age but limited medical historian. Normal language. Speech sl dysarthric. Follows simple commands. Left central 7 and tongue deviation. Right gaze preference.  Left sided inattention. LUE 2 to 2+/5. LLE 3 to 3+/5. RUE and RLE 4 to 4+/5. No obvious sensory deficits. No limb ataxia apparent. Normal  resting tone and DTR's 1+.     Assessment/Plan: 1. Functional deficits which require 3+ hours per day of interdisciplinary therapy in a comprehensive inpatient rehab setting. Physiatrist is providing close team supervision and 24 hour management of active medical problems listed below. Physiatrist and rehab team continue to assess barriers to discharge/monitor patient progress toward functional and medical goals  Care Tool:  Bathing  Bathing activity did not occur: Refused Body parts bathed by patient: Left arm, Chest, Abdomen, Face   Body parts bathed by helper: Right arm, Left lower leg, Right lower leg, Left upper leg, Right upper leg, Buttocks, Front perineal area     Bathing assist Assist Level: Maximal Assistance - Patient 24 - 49%     Upper Body Dressing/Undressing Upper body dressing Upper body dressing/undressing activity did not occur (including orthotics): Refused What is the patient wearing?: Pull over shirt    Upper body assist Assist Level: Moderate Assistance - Patient 50 - 74%    Lower Body Dressing/Undressing Lower body dressing    Lower body dressing activity did not occur: Refused What is the patient wearing?: Pants     Lower body assist Assist for lower body dressing: Maximal Assistance - Patient 25 - 49%     Toileting Toileting    Toileting  assist Assist for toileting: Moderate Assistance - Patient 50 - 74%     Transfers Chair/bed transfer  Transfers assist  Chair/bed transfer activity did not occur: Safety/medical concerns  Chair/bed transfer assist level: Contact Guard/Touching assist     Locomotion Ambulation   Ambulation assist   Ambulation activity did not occur: Safety/medical concerns  Assist level: Contact Guard/Touching assist Assistive device: Walker-rolling Max distance: 167ft   Walk 10 feet activity   Assist  Walk 10 feet activity did not occur: Safety/medical concerns  Assist level: Contact Guard/Touching  assist Assistive device: Walker-rolling   Walk 50 feet activity   Assist Walk 50 feet with 2 turns activity did not occur: Safety/medical concerns  Assist level: Contact Guard/Touching assist Assistive device: Walker-rolling    Walk 150 feet activity   Assist Walk 150 feet activity did not occur: Safety/medical concerns  Assist level: Contact Guard/Touching assist Assistive device: Walker-rolling    Walk 10 feet on uneven surface  activity   Assist Walk 10 feet on uneven surfaces activity did not occur: Safety/medical concerns   Assist level: Minimal Assistance - Patient > 75% Assistive device: Walker-rolling   Wheelchair     Assist Is the patient using a wheelchair?: No Type of Wheelchair: Manual Wheelchair activity did not occur: N/A  Wheelchair assist level: Dependent - Patient 0%      Wheelchair 50 feet with 2 turns activity    Assist    Wheelchair 50 feet with 2 turns activity did not occur: N/A   Assist Level: Dependent - Patient 0%   Wheelchair 150 feet activity     Assist  Wheelchair 150 feet activity did not occur: N/A   Assist Level: Dependent - Patient 0%   Blood pressure (!) 112/52, pulse 90, temperature 98.6 F (37 C), temperature source Oral, resp. rate 16, height 5\' 7"  (1.702 m), weight 71.1 kg, SpO2 95 %.    Medical Problem List and Plan: 1. Functional deficits  disconjugate gaze with altered mental status/acute hepatic metabolic encephalopathy/multifactorial secondary to right MCA scattered infarcts             -patient may shower             -ELOS/Goals: 14-17 days, min assist goals with PT, OT, SLP  Continue CIR  HFU scheduled.   Updated daughter  2.  Impaired mobility -DVT/anticoagulation:  Mechanical: Antiembolism stockings, thigh (TED hose) Bilateral lower extremities             -antiplatelet therapy: continue Aspirin 81 mg daily 3. Thigh pain: add kpad. Tylenol held due to elevated LFTs 4. Mood: Provide emotional  support             -antipsychotic agents: N/A 5. Neuropsych: This patient is capable of making decisions on his own behalf. 6. Skin/Wound Care: Routine skin checks 7. Fluids/Electrolytes/Nutrition: Routine in and outs with follow-up chemistries  8.  Acute blood loss anemia due to upper GI bleed esophageal varices.  Status post banding x4 12/30/2020.   -continue Protonix twice daily.   CBC Latest Ref Rng & Units 01/13/2021 01/10/2021 01/08/2021  WBC 4.0 - 10.5 K/uL 11.8(H) 11.5(H) 12.0(H)  Hemoglobin 13.0 - 17.0 g/dL 9.9(L) 8.5(L) 9.5(L)  Hematocrit 39.0 - 52.0 % 31.4(L) 26.6(L) 30.8(L)  Platelets 150 - 400 K/uL 143(L) 178 224    -Follow-up per GI services 9.  Acute hypoxic respiratory failure.  Extubated 01/06/2020. Discussed importance of using incentive spirometer. Wean off O2.  10.  Metastatic recurrent melanoma/left upper lobe lesion,  mediastinal adenopathy.  Status post 4 cycles ipo.nivo.  Follow-up outpatient             -currently on oxygen. Wean to off as able 10.  COVID-19 infection.  Appeared incidental.  Isolation completed. 11.  Recurrent low-grade urothelial carcinoma.  Status post TURBT x3.  Follow-up outpatient.   12.  Elevated LFTs/hepatic cirrhosis.  Markedly improved.  Continue lactulose.  Holding Lasix/Spiro due to AKI. Monitor LFTs as needed 13.  AKI.  Status post solitary kidney status post nephrectomy due to [place]. Encouraged 6-8 glasses of water per day. Cr reviewed and has normalized.  BMP Latest Ref Rng & Units 01/21/2021 01/18/2021 01/13/2021  Glucose 70 - 99 mg/dL 149(H) 175(H) 126(H)  BUN 8 - 23 mg/dL 13 12 20   Creatinine 0.61 - 1.24 mg/dL 1.23 1.17 1.38(H)  BUN/Creat Ratio 10 - 24 - - -  Sodium 135 - 145 mmol/L 143 142 145  Potassium 3.5 - 5.1 mmol/L 3.8 3.9 4.2  Chloride 98 - 111 mmol/L 105 109 115(H)  CO2 22 - 32 mmol/L 24 24 22   Calcium 8.9 - 10.3 mg/dL 8.6(L) 8.4(L) 7.7(L)   Improving  14.  Diabetes mellitus.  Hemoglobin A1c 7.6.  SSI. Provided dietary  education.             CBG (last 3)  Recent Labs    01/28/21 1817 01/28/21 2102 01/29/21 0535  GLUCAP 99 126* 116*  Commended on improvements in CBG control. Provided with list of foods that can help with diabetes.   15.  Hypertension.  Well controlled, continue Norvasc to 5 mg daily.  Monitor with increased mobility Vitals:   01/28/21 1915 01/29/21 0457  BP: 128/71 (!) 112/52  Pulse: 88 90  Resp: 16 16  Temp: 99.5 F (37.5 C) 98.6 F (37 C)  SpO2: 93% 95%    16.  Hyperlipidemia. continue Lipitor resumed 10 mg daily 17. Obesity BMI 30.25-->28.80: provided dietary education. 18. Diarrhea: decrease lactulose to every other day  19. Mild oropharyngeal dysphagia: continue SLP 20. [place] extremity edema: lasix 10mg  on 1/18. Repeat creatinine stable, discussed with patient. Vascular ultrasound obtained, reviewed, and negative.  21. Diabetic peripheral neuropathy: discussed [place] outpatient. Will minimize any medications that can impact his cognition 22. Insomnia: continue melatonin to 6mg , improved 23. Impaired cognition: continue memory notebook.  24. Cough: continue robitussin. CXR obtained and shows mild bronchitic changes   >30 minutes spent in discharge of patient including review of medications and follow-up appointments, physical examination, and in answering all patient's questions     LOS: 17 days A FACE TO FACE EVALUATION WAS PERFORMED  Martha Clan P Ryland [place] 01/29/2021, 10:54 AM

## 2021-02-02 ENCOUNTER — Ambulatory Visit: Payer: Medicare PPO | Admitting: Gastroenterology

## 2021-02-02 ENCOUNTER — Encounter (HOSPITAL_COMMUNITY): Payer: No Typology Code available for payment source | Admitting: Occupational Therapy

## 2021-02-04 ENCOUNTER — Ambulatory Visit: Payer: Medicare PPO | Admitting: Gastroenterology

## 2021-02-04 ENCOUNTER — Encounter (HOSPITAL_COMMUNITY): Payer: No Typology Code available for payment source | Admitting: Occupational Therapy

## 2021-02-09 ENCOUNTER — Encounter (HOSPITAL_COMMUNITY): Payer: No Typology Code available for payment source | Admitting: Occupational Therapy

## 2021-02-11 ENCOUNTER — Encounter (HOSPITAL_COMMUNITY): Payer: No Typology Code available for payment source | Admitting: Occupational Therapy

## 2021-02-16 ENCOUNTER — Encounter (HOSPITAL_COMMUNITY): Payer: No Typology Code available for payment source | Admitting: Occupational Therapy

## 2021-02-18 ENCOUNTER — Encounter (HOSPITAL_COMMUNITY): Payer: No Typology Code available for payment source | Admitting: Occupational Therapy

## 2021-02-26 ENCOUNTER — Encounter: Payer: Medicare PPO | Admitting: Occupational Therapy

## 2021-02-26 ENCOUNTER — Ambulatory Visit: Payer: Medicare PPO

## 2021-03-01 ENCOUNTER — Inpatient Hospital Stay: Payer: No Typology Code available for payment source | Admitting: Physical Medicine and Rehabilitation

## 2021-03-02 ENCOUNTER — Ambulatory Visit: Payer: Medicare PPO | Admitting: Gastroenterology

## 2021-03-10 ENCOUNTER — Emergency Department (HOSPITAL_COMMUNITY)
Admission: EM | Admit: 2021-03-10 | Discharge: 2021-03-10 | Discharge status: Against Medical Advice | Payer: No Typology Code available for payment source | Attending: Emergency Medicine | Admitting: Emergency Medicine

## 2021-03-10 ENCOUNTER — Other Ambulatory Visit: Payer: Self-pay

## 2021-03-10 DIAGNOSIS — R1030 Lower abdominal pain, unspecified: Secondary | ICD-10-CM | POA: Insufficient documentation

## 2021-03-10 DIAGNOSIS — R197 Diarrhea, unspecified: Secondary | ICD-10-CM | POA: Insufficient documentation

## 2021-03-10 DIAGNOSIS — Z5321 Procedure and treatment not carried out due to patient leaving prior to being seen by health care provider: Secondary | ICD-10-CM | POA: Insufficient documentation

## 2021-03-10 LAB — CBC WITH DIFFERENTIAL/PLATELET
Abs Immature Granulocytes: 0.03 10*3/uL (ref 0.00–0.07)
Basophils Absolute: 0.1 10*3/uL (ref 0.0–0.1)
Basophils Relative: 1 %
Eosinophils Absolute: 0.1 10*3/uL (ref 0.0–0.5)
Eosinophils Relative: 1 %
HCT: 30.3 % — ABNORMAL LOW (ref 39.0–52.0)
Hemoglobin: 9.5 g/dL — ABNORMAL LOW (ref 13.0–17.0)
Immature Granulocytes: 0 %
Lymphocytes Relative: 22 %
Lymphs Abs: 2.3 10*3/uL (ref 0.7–4.0)
MCH: 28.2 pg (ref 26.0–34.0)
MCHC: 31.4 g/dL (ref 30.0–36.0)
MCV: 89.9 fL (ref 80.0–100.0)
Monocytes Absolute: 0.9 10*3/uL (ref 0.1–1.0)
Monocytes Relative: 9 %
Neutro Abs: 6.8 10*3/uL (ref 1.7–7.7)
Neutrophils Relative %: 67 %
Platelets: 157 10*3/uL (ref 150–400)
RBC: 3.37 MIL/uL — ABNORMAL LOW (ref 4.22–5.81)
RDW: 13.3 % (ref 11.5–15.5)
WBC: 10.2 10*3/uL (ref 4.0–10.5)
nRBC: 0 % (ref 0.0–0.2)

## 2021-03-10 LAB — COMPREHENSIVE METABOLIC PANEL
ALT: 13 U/L (ref 0–44)
AST: 16 U/L (ref 15–41)
Albumin: 3.3 g/dL — ABNORMAL LOW (ref 3.5–5.0)
Alkaline Phosphatase: 73 U/L (ref 38–126)
Anion gap: 10 (ref 5–15)
BUN: 15 mg/dL (ref 8–23)
CO2: 20 mmol/L — ABNORMAL LOW (ref 22–32)
Calcium: 9.3 mg/dL (ref 8.9–10.3)
Chloride: 107 mmol/L (ref 98–111)
Creatinine, Ser: 0.9 mg/dL (ref 0.61–1.24)
GFR, Estimated: >60 mL/min (ref >60)
Glucose, Bld: 148 mg/dL — ABNORMAL HIGH (ref 70–99)
Potassium: 3.5 mmol/L (ref 3.5–5.1)
Sodium: 137 mmol/L (ref 135–145)
Total Bilirubin: 0.8 mg/dL (ref 0.3–1.2)
Total Protein: 6.6 g/dL (ref 6.5–8.1)

## 2021-03-10 LAB — LIPASE, BLOOD: Lipase: 36 U/L (ref 11–51)

## 2021-03-10 NOTE — ED Notes (Signed)
Call patient x4 no answer ?

## 2021-03-10 NOTE — ED Provider Triage Note (Signed)
Emergency Medicine Provider Triage Evaluation Note ? ?Dalton Miller , a 76 y.o. male  was evaluated in triage.  Pt complains of diarrhea.  Reports that he has had diarrhea since Sunday.  Patient reports that he is having bowel movements approximately every 30 minutes.  Endorses generalized abdominal cramping.   ? ?Denies any nausea, vomiting, constipation, blood in stool, melena, dysuria, hematuria, urinary urgency. ? ?No recent antibiotic use, international travel, or camping. ? ?Review of Systems  ?Positive: Diarrhea, abdominal cramping ?Negative: See above ? ?Physical Exam  ?BP (!) 148/89 (BP Location: Left Arm)   Pulse (!) 101   Temp 98.3 ?F (36.8 ?C) (Oral)   Resp 19   SpO2 98%  ?Gen:   Awake, no distress   ?Resp:  Normal effort  ?MSK:   Moves extremities without difficulty  ?Other:  Abdomen soft, nondistended, nontender with no guarding or rebound tenderness. ? ?Medical Decision Making  ?Medically screening exam initiated at 1:56 AM.  Appropriate orders placed.  [name] was informed that the remainder of the evaluation will be completed by another provider, this initial triage assessment does not replace that evaluation, and the importance of remaining in the ED until their evaluation is complete. ? ?  ?Loni Beckwith, PA-C ?03/10/21 0158 ? ?

## 2021-03-10 NOTE — ED Triage Notes (Addendum)
Pt reported to ED with c /o lower abdominal pain and diarrhea since Sunday. Denies any nausea, vomiting or recent antibiotic use.  ?

## 2021-03-23 ENCOUNTER — Inpatient Hospital Stay: Payer: Medicare PPO | Admitting: Adult Health

## 2021-05-04 ENCOUNTER — Encounter: Payer: Self-pay | Admitting: Neurology

## 2021-05-12 ENCOUNTER — Ambulatory Visit: Payer: Medicare HMO

## 2021-05-27 ENCOUNTER — Encounter (HOSPITAL_COMMUNITY): Payer: Self-pay | Admitting: *Deleted

## 2021-05-27 ENCOUNTER — Inpatient Hospital Stay (HOSPITAL_COMMUNITY)
Admission: EM | Admit: 2021-05-27 | Discharge: 2021-06-01 | DRG: 481 | Disposition: A | Discharge status: Rehabilitation Facility | Payer: No Typology Code available for payment source | Attending: Internal Medicine | Admitting: Internal Medicine

## 2021-05-27 ENCOUNTER — Emergency Department (HOSPITAL_COMMUNITY): Payer: No Typology Code available for payment source

## 2021-05-27 ENCOUNTER — Other Ambulatory Visit: Payer: Self-pay

## 2021-05-27 DIAGNOSIS — Y92012 Bathroom of single-family (private) house as the place of occurrence of the external cause: Secondary | ICD-10-CM | POA: Diagnosis not present

## 2021-05-27 DIAGNOSIS — G8194 Hemiplegia, unspecified affecting left nondominant side: Secondary | ICD-10-CM | POA: Diagnosis not present

## 2021-05-27 DIAGNOSIS — E559 Vitamin D deficiency, unspecified: Secondary | ICD-10-CM | POA: Diagnosis present

## 2021-05-27 DIAGNOSIS — D62 Acute posthemorrhagic anemia: Secondary | ICD-10-CM | POA: Diagnosis not present

## 2021-05-27 DIAGNOSIS — J449 Chronic obstructive pulmonary disease, unspecified: Secondary | ICD-10-CM | POA: Diagnosis present

## 2021-05-27 DIAGNOSIS — E1169 Type 2 diabetes mellitus with other specified complication: Secondary | ICD-10-CM | POA: Diagnosis present

## 2021-05-27 DIAGNOSIS — R339 Retention of urine, unspecified: Secondary | ICD-10-CM | POA: Diagnosis present

## 2021-05-27 DIAGNOSIS — E871 Hypo-osmolality and hyponatremia: Secondary | ICD-10-CM | POA: Diagnosis not present

## 2021-05-27 DIAGNOSIS — Z96651 Presence of right artificial knee joint: Secondary | ICD-10-CM | POA: Diagnosis present

## 2021-05-27 DIAGNOSIS — C439 Malignant melanoma of skin, unspecified: Secondary | ICD-10-CM | POA: Diagnosis present

## 2021-05-27 DIAGNOSIS — I8510 Secondary esophageal varices without bleeding: Secondary | ICD-10-CM | POA: Diagnosis present

## 2021-05-27 DIAGNOSIS — C3412 Malignant neoplasm of upper lobe, left bronchus or lung: Secondary | ICD-10-CM | POA: Diagnosis not present

## 2021-05-27 DIAGNOSIS — Z7982 Long term (current) use of aspirin: Secondary | ICD-10-CM

## 2021-05-27 DIAGNOSIS — C799 Secondary malignant neoplasm of unspecified site: Secondary | ICD-10-CM | POA: Diagnosis present

## 2021-05-27 DIAGNOSIS — I63511 Cerebral infarction due to unspecified occlusion or stenosis of right middle cerebral artery: Secondary | ICD-10-CM | POA: Diagnosis not present

## 2021-05-27 DIAGNOSIS — F17210 Nicotine dependence, cigarettes, uncomplicated: Secondary | ICD-10-CM | POA: Diagnosis present

## 2021-05-27 DIAGNOSIS — Z789 Other specified health status: Secondary | ICD-10-CM | POA: Diagnosis not present

## 2021-05-27 DIAGNOSIS — S72142D Displaced intertrochanteric fracture of left femur, subsequent encounter for closed fracture with routine healing: Secondary | ICD-10-CM | POA: Diagnosis not present

## 2021-05-27 DIAGNOSIS — F419 Anxiety disorder, unspecified: Secondary | ICD-10-CM | POA: Diagnosis not present

## 2021-05-27 DIAGNOSIS — Z823 Family history of stroke: Secondary | ICD-10-CM

## 2021-05-27 DIAGNOSIS — W010XXA Fall on same level from slipping, tripping and stumbling without subsequent striking against object, initial encounter: Secondary | ICD-10-CM | POA: Diagnosis present

## 2021-05-27 DIAGNOSIS — K7460 Unspecified cirrhosis of liver: Secondary | ICD-10-CM | POA: Diagnosis present

## 2021-05-27 DIAGNOSIS — S72142A Displaced intertrochanteric fracture of left femur, initial encounter for closed fracture: Secondary | ICD-10-CM | POA: Diagnosis present

## 2021-05-27 DIAGNOSIS — E785 Hyperlipidemia, unspecified: Secondary | ICD-10-CM | POA: Diagnosis present

## 2021-05-27 DIAGNOSIS — Z825 Family history of asthma and other chronic lower respiratory diseases: Secondary | ICD-10-CM | POA: Diagnosis not present

## 2021-05-27 DIAGNOSIS — K5900 Constipation, unspecified: Secondary | ICD-10-CM | POA: Diagnosis not present

## 2021-05-27 DIAGNOSIS — Z85118 Personal history of other malignant neoplasm of bronchus and lung: Secondary | ICD-10-CM

## 2021-05-27 DIAGNOSIS — S72001A Fracture of unspecified part of neck of right femur, initial encounter for closed fracture: Secondary | ICD-10-CM | POA: Diagnosis not present

## 2021-05-27 DIAGNOSIS — E119 Type 2 diabetes mellitus without complications: Secondary | ICD-10-CM

## 2021-05-27 DIAGNOSIS — I10 Essential (primary) hypertension: Secondary | ICD-10-CM | POA: Diagnosis present

## 2021-05-27 DIAGNOSIS — Z79899 Other long term (current) drug therapy: Secondary | ICD-10-CM

## 2021-05-27 DIAGNOSIS — Z8673 Personal history of transient ischemic attack (TIA), and cerebral infarction without residual deficits: Secondary | ICD-10-CM

## 2021-05-27 DIAGNOSIS — D696 Thrombocytopenia, unspecified: Secondary | ICD-10-CM | POA: Diagnosis present

## 2021-05-27 DIAGNOSIS — E1165 Type 2 diabetes mellitus with hyperglycemia: Secondary | ICD-10-CM

## 2021-05-27 DIAGNOSIS — Z8249 Family history of ischemic heart disease and other diseases of the circulatory system: Secondary | ICD-10-CM | POA: Diagnosis not present

## 2021-05-27 DIAGNOSIS — Z833 Family history of diabetes mellitus: Secondary | ICD-10-CM | POA: Diagnosis not present

## 2021-05-27 DIAGNOSIS — I639 Cerebral infarction, unspecified: Secondary | ICD-10-CM | POA: Diagnosis present

## 2021-05-27 DIAGNOSIS — Z8616 Personal history of COVID-19: Secondary | ICD-10-CM | POA: Diagnosis not present

## 2021-05-27 DIAGNOSIS — I152 Hypertension secondary to endocrine disorders: Secondary | ICD-10-CM | POA: Diagnosis present

## 2021-05-27 DIAGNOSIS — S72002D Fracture of unspecified part of neck of left femur, subsequent encounter for closed fracture with routine healing: Secondary | ICD-10-CM | POA: Diagnosis not present

## 2021-05-27 DIAGNOSIS — S72009A Fracture of unspecified part of neck of unspecified femur, initial encounter for closed fracture: Secondary | ICD-10-CM | POA: Diagnosis present

## 2021-05-27 DIAGNOSIS — E1159 Type 2 diabetes mellitus with other circulatory complications: Secondary | ICD-10-CM | POA: Diagnosis present

## 2021-05-27 DIAGNOSIS — S72002A Fracture of unspecified part of neck of left femur, initial encounter for closed fracture: Secondary | ICD-10-CM | POA: Diagnosis not present

## 2021-05-27 DIAGNOSIS — D649 Anemia, unspecified: Secondary | ICD-10-CM | POA: Diagnosis not present

## 2021-05-27 DIAGNOSIS — Z7409 Other reduced mobility: Secondary | ICD-10-CM | POA: Diagnosis not present

## 2021-05-27 DIAGNOSIS — F17290 Nicotine dependence, other tobacco product, uncomplicated: Secondary | ICD-10-CM | POA: Diagnosis present

## 2021-05-27 DIAGNOSIS — K5901 Slow transit constipation: Secondary | ICD-10-CM | POA: Diagnosis not present

## 2021-05-27 DIAGNOSIS — Z905 Acquired absence of kidney: Secondary | ICD-10-CM

## 2021-05-27 LAB — COMPREHENSIVE METABOLIC PANEL
ALT: 26 U/L (ref 0–44)
AST: 22 U/L (ref 15–41)
Albumin: 4.1 g/dL (ref 3.5–5.0)
Alkaline Phosphatase: 116 U/L (ref 38–126)
Anion gap: 8 (ref 5–15)
BUN: 19 mg/dL (ref 8–23)
CO2: 22 mmol/L (ref 22–32)
Calcium: 9.2 mg/dL (ref 8.9–10.3)
Chloride: 107 mmol/L (ref 98–111)
Creatinine, Ser: 0.92 mg/dL (ref 0.61–1.24)
GFR, Estimated: >60 mL/min (ref >60)
Glucose, Bld: 202 mg/dL — ABNORMAL HIGH (ref 70–99)
Potassium: 4.1 mmol/L (ref 3.5–5.1)
Sodium: 137 mmol/L (ref 135–145)
Total Bilirubin: 0.9 mg/dL (ref 0.3–1.2)
Total Protein: 7.3 g/dL (ref 6.5–8.1)

## 2021-05-27 LAB — CBC
HCT: 36.7 % — ABNORMAL LOW (ref 39.0–52.0)
Hemoglobin: 11.6 g/dL — ABNORMAL LOW (ref 13.0–17.0)
MCH: 28.3 pg (ref 26.0–34.0)
MCHC: 31.6 g/dL (ref 30.0–36.0)
MCV: 89.5 fL (ref 80.0–100.0)
Platelets: 95 10*3/uL — ABNORMAL LOW (ref 150–400)
RBC: 4.1 MIL/uL — ABNORMAL LOW (ref 4.22–5.81)
RDW: 16.8 % — ABNORMAL HIGH (ref 11.5–15.5)
WBC: 7.6 10*3/uL (ref 4.0–10.5)
nRBC: 0 % (ref 0.0–0.2)

## 2021-05-27 LAB — PROTIME-INR
INR: 1 (ref 0.8–1.2)
Prothrombin Time: 13.1 seconds (ref 11.4–15.2)

## 2021-05-27 LAB — CBG MONITORING, ED: Glucose-Capillary: 166 mg/dL — ABNORMAL HIGH (ref 70–99)

## 2021-05-27 MED ORDER — MELATONIN 3 MG PO TABS
6.0000 mg | ORAL_TABLET | Freq: Every day | ORAL | Status: DC
  Administered 2021-05-28 – 2021-05-31 (×3): 6 mg via ORAL
  Filled 2021-05-27 (×4): qty 2

## 2021-05-27 MED ORDER — HEPARIN SODIUM (PORCINE) 5000 UNIT/ML IJ SOLN: 5000.0000 [IU] | Freq: Three times a day (TID) | INTRAMUSCULAR | Status: DC

## 2021-05-27 MED ORDER — LACTULOSE ENCEPHALOPATHY 10 GM/15ML PO SOLN
10.0000 g | ORAL | Status: DC
  Filled 2021-05-27 (×2): qty 15

## 2021-05-27 MED ORDER — AMLODIPINE BESYLATE 5 MG PO TABS
5.0000 mg | ORAL_TABLET | Freq: Every day | ORAL | Status: DC
  Administered 2021-05-28 – 2021-06-01 (×5): 5 mg via ORAL
  Filled 2021-05-27 (×5): qty 1

## 2021-05-27 MED ORDER — PANTOPRAZOLE SODIUM 40 MG PO TBEC
40.0000 mg | DELAYED_RELEASE_TABLET | Freq: Two times a day (BID) | ORAL | Status: DC
  Administered 2021-05-28 – 2021-06-01 (×5): 40 mg via ORAL
  Filled 2021-05-27 (×8): qty 1

## 2021-05-27 MED ORDER — DOCUSATE SODIUM 100 MG PO CAPS
100.0000 mg | ORAL_CAPSULE | Freq: Two times a day (BID) | ORAL | Status: DC
  Administered 2021-05-28 – 2021-06-01 (×3): 100 mg via ORAL
  Filled 2021-05-27 (×7): qty 1

## 2021-05-27 MED ORDER — INSULIN ASPART 100 UNIT/ML IJ SOLN
0.0000 [IU] | Freq: Every day | INTRAMUSCULAR | Status: DC
  Administered 2021-05-29: 2 [IU] via SUBCUTANEOUS
  Administered 2021-05-30: 3 [IU] via SUBCUTANEOUS

## 2021-05-27 MED ORDER — POLYVINYL ALCOHOL 1.4 % OP SOLN
1.0000 [drp] | Freq: Four times a day (QID) | OPHTHALMIC | Status: DC
  Administered 2021-05-27 – 2021-06-01 (×15): 1 [drp] via OPHTHALMIC
  Filled 2021-05-27: qty 15

## 2021-05-27 MED ORDER — ADULT MULTIVITAMIN W/MINERALS CH
1.0000 | ORAL_TABLET | Freq: Every day | ORAL | Status: DC
  Administered 2021-05-28 – 2021-06-01 (×5): 1 via ORAL
  Filled 2021-05-27 (×5): qty 1

## 2021-05-27 MED ORDER — RIFAXIMIN 550 MG PO TABS
550.0000 mg | ORAL_TABLET | Freq: Two times a day (BID) | ORAL | Status: DC
  Administered 2021-05-28 – 2021-06-01 (×6): 550 mg via ORAL
  Filled 2021-05-27 (×10): qty 1

## 2021-05-27 MED ORDER — METHOCARBAMOL 1000 MG/10ML IJ SOLN: 500.0000 mg | Freq: Four times a day (QID) | INTRAVENOUS | Status: DC | PRN

## 2021-05-27 MED ORDER — MORPHINE SULFATE (PF) 4 MG/ML IV SOLN
4.0000 mg | Freq: Once | INTRAVENOUS | Status: AC
  Administered 2021-05-27: 4 mg via INTRAVENOUS
  Filled 2021-05-27: qty 1

## 2021-05-27 MED ORDER — ASPIRIN 81 MG PO TBEC: 81.0000 mg | DELAYED_RELEASE_TABLET | Freq: Every day | ORAL | Status: DC

## 2021-05-27 MED ORDER — METHOCARBAMOL 500 MG PO TABS
500.0000 mg | ORAL_TABLET | Freq: Four times a day (QID) | ORAL | Status: DC | PRN
  Administered 2021-05-27 – 2021-06-01 (×6): 500 mg via ORAL
  Filled 2021-05-27 (×6): qty 1

## 2021-05-27 MED ORDER — ATORVASTATIN CALCIUM 10 MG PO TABS
10.0000 mg | ORAL_TABLET | Freq: Every day | ORAL | Status: DC
  Administered 2021-05-28 – 2021-06-01 (×5): 10 mg via ORAL
  Filled 2021-05-27 (×5): qty 1

## 2021-05-27 MED ORDER — INSULIN ASPART 100 UNIT/ML IJ SOLN
0.0000 [IU] | Freq: Three times a day (TID) | INTRAMUSCULAR | Status: DC
  Administered 2021-05-29: 2 [IU] via SUBCUTANEOUS
  Administered 2021-05-29 – 2021-05-30 (×2): 3 [IU] via SUBCUTANEOUS
  Administered 2021-05-30 – 2021-05-31 (×3): 2 [IU] via SUBCUTANEOUS
  Administered 2021-05-31: 3 [IU] via SUBCUTANEOUS
  Administered 2021-05-31 – 2021-06-01 (×3): 2 [IU] via SUBCUTANEOUS
  Filled 2021-05-27: qty 1

## 2021-05-27 MED ORDER — HYDROMORPHONE HCL 1 MG/ML IJ SOLN
0.5000 mg | INTRAMUSCULAR | Status: DC | PRN
  Administered 2021-05-27 – 2021-05-29 (×4): 0.5 mg via INTRAVENOUS
  Filled 2021-05-27 (×4): qty 0.5

## 2021-05-27 MED ORDER — OXYCODONE HCL 5 MG PO TABS
5.0000 mg | ORAL_TABLET | ORAL | Status: DC | PRN
  Administered 2021-05-27: 10 mg via ORAL
  Administered 2021-05-28: 5 mg via ORAL
  Administered 2021-05-29 (×2): 10 mg via ORAL
  Administered 2021-05-29: 5 mg via ORAL
  Administered 2021-05-30 – 2021-06-01 (×3): 10 mg via ORAL
  Filled 2021-05-27 (×4): qty 2
  Filled 2021-05-27 (×2): qty 1
  Filled 2021-05-27 (×2): qty 2

## 2021-05-27 NOTE — ED Notes (Signed)
Pt daughter asked to be updated when pt is transferred. Her phone number is [phone], does not matter what time it is when called. If no answer, please leave a voicemail.

## 2021-05-27 NOTE — ED Provider Notes (Incomplete)
[facility] EMERGENCY DEPARTMENT Provider Note   CSN: [id number] Arrival date & time: 05/27/21  1801     History  Chief Complaint  Patient presents with  . Hip Injury    Dalton Miller is a 76 y.o. male.  Patient presents to the hospital via EMS complaining of left-sided hip pain secondary to a fall.  The patient states he was reaching over discharge toilet when he lost his balance and fell.  He has been unable to straighten his leg and is unable to bear weight. He denies hitting his head, denies LOC.  PMH significant for DM, hyperlipidemia, HTN, Vitamin D deficiency, COPD, malignant neoplasm of upper lobe of left lung, CVA  HPI     Home Medications Prior to Admission medications   Medication Sig Start Date End Date Taking? Authorizing Provider  acetaminophen (TYLENOL) 325 MG tablet Take 2 tablets (650 mg total) by mouth every 6 (six) hours as needed for mild pain. 01/28/21  Yes Angiulli, Lavon Paganini, PA-C  amLODipine (NORVASC) 5 MG tablet Take 1 tablet (5 mg total) by mouth daily. 01/29/21  Yes Angiulli, Lavon Paganini, PA-C  aspirin EC 81 MG tablet Take 1 tablet (81 mg total) by mouth daily. 01/28/21  Yes Angiulli, Lavon Paganini, PA-C  atorvastatin (LIPITOR) 10 MG tablet Take 1 tablet (10 mg total) by mouth daily. 01/28/21  Yes Angiulli, Lavon Paganini, PA-C  Propylene Glycol 0.6 % SOLN Place 1 drop into both eyes 4 (four) times daily. 03/31/21  Yes [provider]  lactulose, encephalopathy, (CHRONULAC) 10 GM/15ML SOLN Take 15 mLs (10 g total) by mouth every other day. Patient not taking: Reported on 05/27/2021 01/28/21   Angiulli, Lavon Paganini, PA-C  loratadine (CLARITIN) 10 MG tablet Take 1 tablet (10 mg total) by mouth daily. Patient not taking: Reported on 05/27/2021 01/28/21   Angiulli, Lavon Paganini, PA-C  melatonin 3 MG TABS tablet Take 2 tablets (6 mg total) by mouth at bedtime. Patient not taking: Reported on 05/27/2021 01/28/21   Cathlyn Parsons, PA-C  Multiple Vitamin (MULTIVITAMIN WITH MINERALS)  TABS tablet Take 1 tablet by mouth daily. Patient not taking: Reported on 05/27/2021 01/28/21   Angiulli, Lavon Paganini, PA-C  pantoprazole (PROTONIX) 40 MG tablet Take 1 tablet (40 mg total) by mouth 2 (two) times daily. Patient not taking: Reported on 05/27/2021 01/28/21   Angiulli, Lavon Paganini, PA-C  rifaximin (XIFAXAN) 550 MG TABS tablet Take 1 tablet (550 mg total) by mouth 2 (two) times daily. Patient not taking: Reported on 05/27/2021 01/28/21   Angiulli, Lavon Paganini, PA-C      Allergies    Patient has no known allergies.    Review of Systems   Review of Systems  Musculoskeletal:  Positive for arthralgias.   Physical Exam Updated Vital Signs BP (!) 152/87   Pulse 100   Temp 98 F (36.7 C) (Oral)   Resp 16   Ht 5\' 7"  (1.702 m)   Wt 68 kg   SpO2 95%   BMI 23.49 kg/m  Physical Exam Vitals and nursing note reviewed.  Constitutional:      General: He is not in acute distress.    Appearance: He is normal weight.  HENT:     Head: Normocephalic and atraumatic.  Eyes:     Conjunctiva/sclera: Conjunctivae normal.  Cardiovascular:     Rate and Rhythm: Normal rate and regular rhythm.     Pulses: Normal pulses.  Pulmonary:     Effort: Pulmonary effort is normal.  Breath sounds: Normal breath sounds.  Musculoskeletal:        General: Tenderness, deformity and signs of injury present.     Cervical back: Normal range of motion and neck supple.     Right lower leg: No edema.     Left lower leg: No edema.     Comments: Tenderness to palpation near left groin. Patient unwilling to straighten left leg. Unclear if shortened or inwardly rotated  Skin:    Capillary Refill: Capillary refill takes less than 2 seconds.  Neurological:     Mental Status: He is alert.    ED Results / Procedures / Treatments   Labs (all labs ordered are listed, but only abnormal results are displayed) Labs Reviewed  CBC - Abnormal; Notable for the following components:      Result Value   RBC 4.10 (*)     Hemoglobin 11.6 (*)    HCT 36.7 (*)    RDW 16.8 (*)    Platelets 95 (*)    All other components within normal limits  PROTIME-INR  COMPREHENSIVE METABOLIC PANEL    EKG None  Radiology DG Knee 1-2 Views Left  Result Date: 05/27/2021 CLINICAL DATA:  Fall, left leg pain EXAM: LEFT KNEE - 1-2 VIEW COMPARISON:  None Available. FINDINGS: No evidence of fracture, dislocation, or joint effusion. No evidence of arthropathy or other focal bone abnormality. Soft tissues are unremarkable. IMPRESSION: Negative. Electronically Signed   By: Rolm Baptise M.D.   On: 05/27/2021 20:52   DG Hip Unilat With Pelvis 2-3 Views Left  Result Date: 05/27/2021 CLINICAL DATA:  fall, left hip pain EXAM: DG HIP (WITH OR WITHOUT PELVIS) 2-3V LEFT COMPARISON:  CT 01/03/2021 FINDINGS: There is an acute, displaced left intertrochanteric femur fracture. Moderate osteoarthritis of the hips. Bilateral SI joint and lower lumbar spine degenerative changes. Vascular calcifications. IMPRESSION: Acute, displaced left intertrochanteric femur fracture. Electronically Signed   By: Maurine Simmering M.D.   On: 05/27/2021 19:08    Procedures Procedures  {Document cardiac monitor, telemetry assessment procedure when appropriate:1}  Medications Ordered in ED Medications  polyvinyl alcohol (LIQUIFILM TEARS) 1.4 % ophthalmic solution 1 drop (has no administration in time range)  morphine (PF) 4 MG/ML injection 4 mg (4 mg Intravenous Given 05/27/21 2001)    ED Course/ Medical Decision Making/ A&P                           Medical Decision Making Amount and/or Complexity of Data Reviewed Radiology: ordered.  Risk Prescription drug management. Decision regarding hospitalization.   ***  {Document critical care time when appropriate:1} {Document review of labs and clinical decision tools ie heart score, Chads2Vasc2 etc:1}  {Document your independent review of radiology images, and any outside records:1} {Document your discussion  with family members, caretakers, and with consultants:1} {Document social determinants of health affecting pt's care:1} {Document your decision making why or why not admission, treatments were needed:1} Final Clinical Impression(s) / ED Diagnoses Final diagnoses:  None    Rx / DC Orders ED Discharge Orders     None

## 2021-05-27 NOTE — OR Nursing (Signed)
Plan for ORIF left intertoch fracture tomorrow at [facility] pending medical clearance. Full consult note to follow once transferred.

## 2021-05-27 NOTE — ED Provider Notes (Signed)
[facility] EMERGENCY DEPARTMENT Provider Note   CSN: [id number] Arrival date & time: 05/27/21  1801     History  Chief Complaint  Patient presents with   Hip Injury    Dalton Miller is a 76 y.o. male.  Patient presents to the hospital via EMS complaining of left-sided hip pain secondary to a fall.  The patient states he was reaching over discharge toilet when he lost his balance and fell.  He has been unable to straighten his leg and is unable to bear weight. He denies hitting his head, denies LOC.  PMH significant for DM, hyperlipidemia, HTN, Vitamin D deficiency, COPD, malignant neoplasm of upper lobe of left lung, CVA  HPI     Home Medications Prior to Admission medications   Medication Sig Start Date End Date Taking? Authorizing Provider  acetaminophen (TYLENOL) 325 MG tablet Take 2 tablets (650 mg total) by mouth every 6 (six) hours as needed for mild pain. 01/28/21  Yes Angiulli, Lavon Paganini, PA-C  amLODipine (NORVASC) 5 MG tablet Take 1 tablet (5 mg total) by mouth daily. 01/29/21  Yes Angiulli, Lavon Paganini, PA-C  aspirin EC 81 MG tablet Take 1 tablet (81 mg total) by mouth daily. 01/28/21  Yes Angiulli, Lavon Paganini, PA-C  atorvastatin (LIPITOR) 10 MG tablet Take 1 tablet (10 mg total) by mouth daily. 01/28/21  Yes Angiulli, Lavon Paganini, PA-C  Propylene Glycol 0.6 % SOLN Place 1 drop into both eyes 4 (four) times daily. 03/31/21  Yes [provider]  lactulose, encephalopathy, (CHRONULAC) 10 GM/15ML SOLN Take 15 mLs (10 g total) by mouth every other day. Patient not taking: Reported on 05/27/2021 01/28/21   Angiulli, Lavon Paganini, PA-C  loratadine (CLARITIN) 10 MG tablet Take 1 tablet (10 mg total) by mouth daily. Patient not taking: Reported on 05/27/2021 01/28/21   Angiulli, Lavon Paganini, PA-C  melatonin 3 MG TABS tablet Take 2 tablets (6 mg total) by mouth at bedtime. Patient not taking: Reported on 05/27/2021 01/28/21   Cathlyn Parsons, PA-C  Multiple Vitamin (MULTIVITAMIN WITH MINERALS)  TABS tablet Take 1 tablet by mouth daily. Patient not taking: Reported on 05/27/2021 01/28/21   Angiulli, Lavon Paganini, PA-C  pantoprazole (PROTONIX) 40 MG tablet Take 1 tablet (40 mg total) by mouth 2 (two) times daily. Patient not taking: Reported on 05/27/2021 01/28/21   Angiulli, Lavon Paganini, PA-C  rifaximin (XIFAXAN) 550 MG TABS tablet Take 1 tablet (550 mg total) by mouth 2 (two) times daily. Patient not taking: Reported on 05/27/2021 01/28/21   Angiulli, Lavon Paganini, PA-C      Allergies    Patient has no known allergies.    Review of Systems   Review of Systems  Musculoskeletal:  Positive for arthralgias.   Physical Exam Updated Vital Signs BP (!) 152/87   Pulse 100   Temp 98 F (36.7 C) (Oral)   Resp 16   Ht 5\' 7"  (1.702 m)   Wt 68 kg   SpO2 95%   BMI 23.49 kg/m  Physical Exam Vitals and nursing note reviewed.  Constitutional:      General: He is not in acute distress.    Appearance: He is normal weight.  HENT:     Head: Normocephalic and atraumatic.  Eyes:     Conjunctiva/sclera: Conjunctivae normal.  Cardiovascular:     Rate and Rhythm: Normal rate and regular rhythm.     Pulses: Normal pulses.  Pulmonary:     Effort: Pulmonary effort is normal.  Breath sounds: Normal breath sounds.  Musculoskeletal:        General: Tenderness, deformity and signs of injury present.     Cervical back: Normal range of motion and neck supple.     Right lower leg: No edema.     Left lower leg: No edema.     Comments: Tenderness to palpation near left groin. Patient unwilling to straighten left leg. Unclear if shortened or inwardly rotated  Skin:    Capillary Refill: Capillary refill takes less than 2 seconds.  Neurological:     Mental Status: He is alert.    ED Results / Procedures / Treatments   Labs (all labs ordered are listed, but only abnormal results are displayed) Labs Reviewed  CBC - Abnormal; Notable for the following components:      Result Value   RBC 4.10 (*)     Hemoglobin 11.6 (*)    HCT 36.7 (*)    RDW 16.8 (*)    Platelets 95 (*)    All other components within normal limits  COMPREHENSIVE METABOLIC PANEL - Abnormal; Notable for the following components:   Glucose, Bld 202 (*)    All other components within normal limits  CBG MONITORING, ED - Abnormal; Notable for the following components:   Glucose-Capillary 166 (*)    All other components within normal limits  PROTIME-INR    EKG EKG Interpretation  Date/Time:  Thursday May 27 2021 21:20:15 EDT Ventricular Rate:  97 PR Interval:    QRS Duration: 96 QT Interval:  376 QTC Calculation: 478 R Axis:   136 Text Interpretation: Atrial fibrillation Right axis deviation Low voltage, extremity leads Probable anteroseptal infarct, old Confirmed by Fredia Sorrow [phone]) on 05/27/2021 9:28:00 PM  Radiology DG Knee 1-2 Views Left  Result Date: 05/27/2021 CLINICAL DATA:  Fall, left leg pain EXAM: LEFT KNEE - 1-2 VIEW COMPARISON:  None Available. FINDINGS: No evidence of fracture, dislocation, or joint effusion. No evidence of arthropathy or other focal bone abnormality. Soft tissues are unremarkable. IMPRESSION: Negative. Electronically Signed   By: Rolm Baptise M.D.   On: 05/27/2021 20:52   DG Hip Unilat With Pelvis 2-3 Views Left  Result Date: 05/27/2021 CLINICAL DATA:  fall, left hip pain EXAM: DG HIP (WITH OR WITHOUT PELVIS) 2-3V LEFT COMPARISON:  CT 01/03/2021 FINDINGS: There is an acute, displaced left intertrochanteric femur fracture. Moderate osteoarthritis of the hips. Bilateral SI joint and lower lumbar spine degenerative changes. Vascular calcifications. IMPRESSION: Acute, displaced left intertrochanteric femur fracture. Electronically Signed   By: Maurine Simmering M.D.   On: 05/27/2021 19:08    Procedures Procedures    Medications Ordered in ED Medications  polyvinyl alcohol (LIQUIFILM TEARS) 1.4 % ophthalmic solution 1 drop (1 drop Both Eyes Given 05/27/21 2130)  insulin aspart  (novoLOG) injection 0-9 Units (has no administration in time range)  insulin aspart (novoLOG) injection 0-5 Units (0 Units Subcutaneous Not Given 05/27/21 2151)  morphine (PF) 4 MG/ML injection 4 mg (4 mg Intravenous Given 05/27/21 2001)  morphine (PF) 4 MG/ML injection 4 mg (4 mg Intravenous Given 05/27/21 2152)    ED Course/ Medical Decision Making/ A&P                           Medical Decision Making Amount and/or Complexity of Data Reviewed Radiology: ordered.  Risk Prescription drug management. Decision regarding hospitalization.   The patient presented with chief complaint of left hip pain.  Differential includes  fracture, dislocation, soft tissue injury, and others  I ordered imaging including plain films of the left hip and knee.  I personally interpreted imaging.  Acute, displaced left intertrochanteric femur fracture noted.  No abnormality noted acutely in the knee.  I agree with the radiologist findings.  I ordered morphine for the patient for pain.  Upon reassessment the patient's pain had improved  I discussed the case with orthopedic surgery.  Dr. Zachery Dakins recommended medicine admission at [facility] with plans to possibly operate tomorrow depending on OR availability.  I discussed the case with Dr.Elgergawy, hospitalist, who agreed to admit the patient.  The patient has a hip fracture which required surgical intervention.  Admit to hospital  Final Clinical Impression(s) / ED Diagnoses Final diagnoses:  Closed displaced intertrochanteric fracture of left femur, initial encounter [facility])    Rx / [place] Orders ED Discharge Orders     None         Ronny Bacon 05/27/21 2249    Fredia Sorrow, MD 06/11/21 1535

## 2021-05-27 NOTE — H&P (Addendum)
TRH H&P   Patient Demographics:    Kerron Sedano, is a 76 y.o. male  MRN: [id number]   DOB - 04/24/1945  Admit Date - 05/27/2021  Outpatient Primary MD for the [facility]  Referring MD/NP/PA: PA Ennis  Outpatient Specialists: [facility]     Patient coming from: home   Chief Complaint  Patient presents with   Hip Injury      HPI:    Burgess Sheriff  is a 76 y.o. male, with history of metastatic melanoma, lung CA, urothelial CA, history of  liver cirrhosis(no clear etiology, but no history of alcohol abuse) vaginal varices, GI bleed, CVA, with a prolonged hospitalization last December/January for acute CVA, GI bleed due to liver cirrhosis with varices, COVID-19 infection, patient recovered and he was discharged home from [place] on 01/29/2021.  He receives all of his care at [facility], he is following with oncologist over there, so far he tells me he is in remission, but I have no access to [place] records -Patient presents to ED secondary to mechanical fall, patient reports he went to the bathroom, and he slipped, felt on left side of his body, he denies any dizziness, lightheadedness, loss of consciousness or head trauma, report he was unable to bear any weight on his left leg due to extreme hip pain, he denies any chest pain, shortness of breath, no nausea, no vomiting, no diarrhea, no bright red blood per rectum, no melena, no coffee-ground emesis, reports he is compliant with his medication includes rifaximin. -In ED chest x-ray significant for left hip fracture, labs are pending, EKG is pending, ED physician discussed with orthopedic on-call at [facility] Dr. Pearletha Forge recommended admission to [facility] for surgical repair tomorrow, [facility] hospitalist consulted to admit.    Review of systems:      A full 10 point Review of Systems was done, except as stated above,  all other Review of Systems were negative.   With Past History of the following :    Past Medical History:  Diagnosis Date   Acquired renal cyst of right kidney    Arthritis    Cancer ([place])    renal cell    Cirrhosis ([place])    Erectile dysfunction    Heart murmur    CHILDHOOD   Hematuria    left ureteral bleeding   History of cellulitis    2012 left wrist   History of kidney stones    Hyperlipidemia    Hypertension    Metastatic melanoma to lung, left ([place])    Nephrolithiasis    bilateral non-obstructive per ct 07-02-2015   Type 2 diabetes mellitus ([place])    Wears glasses       Past Surgical History:  Procedure Laterality Date   CARPAL TUNNEL RELEASE Right 1990  approx   COLONOSCOPY  03/04/2010   CYSTOSCOPY W/ RETROGRADES Left  10/28/2015   Procedure: CYSTOSCOPY WITH LEFT RETROGRADE PYELOGRAM;  Surgeon: Cleon Gustin, MD;  Location: [facility];  Service: Urology;  Laterality: Left;   CYSTOSCOPY W/ URETERAL STENT PLACEMENT Left 10/28/2015   Procedure: CYSTOSCOPY WITH LEFT URETERAL STENT EXCHANGE;  Surgeon: Cleon Gustin, MD;  Location: [facility];  Service: Urology;  Laterality: Left;   CYSTOSCOPY WITH RETROGRADE PYELOGRAM, URETEROSCOPY AND STENT PLACEMENT Bilateral 09/17/2015   Procedure: CYSTOSCOPY WITH BILATERAL RETROGRADE PYELOGRAM, LEFT URETEROSCOPY WITH URETERAL BIOPSY AND  STENT PLACEMENT;  Surgeon: Irine Seal, MD;  Location: [facility];  Service: Urology;  Laterality: Bilateral;   ESOPHAGEAL BANDING  12/30/2020   Procedure: ESOPHAGEAL BANDING;  Surgeon: Ladene Artist, MD;  Location: [facility] ENDOSCOPY;  Service: Endoscopy;;   ESOPHAGOGASTRODUODENOSCOPY (EGD) WITH PROPOFOL N/A 12/30/2020   Procedure: ESOPHAGOGASTRODUODENOSCOPY (EGD) WITH PROPOFOL;  Surgeon: Ladene Artist, MD;  Location[facility] ENDOSCOPY;  Service: Endoscopy;  Laterality: N/A;   HOLMIUM LASER APPLICATION Left 65/78/4696   Procedure: HOLMIUM LASER APPLICATION;  Surgeon: Irine Seal, MD;   [facility];  Service: Urology;  Laterality: Left;   KNEE ARTHROSCOPY Right 2008   MELANOMA EXCISION  2018   Left arm   right total knee replacement  Right    2009   ROBOT ASSITED LAPAROSCOPIC NEPHROURETERECTOMY Left 12/04/2015   Procedure: XI ROBOT ASSITED LAPAROSCOPIC NEPHROURETERECTOMY;  Surgeon: Cleon Gustin, MD;  Location: [facility];  Service: Urology;  Laterality: Left;   ROTATOR CUFF REPAIR Right    SHOULDER ARTHROSCOPY Right 12/17/2020   Procedure: Right shoulder arthroscopy, debridement, subacromial decompression, distal clavicle resection;  Surgeon: Justice Britain, MD;  Location: [facility];  Service: Orthopedics;  Laterality: Right;  69min   STONE EXTRACTION WITH BASKET Left 09/17/2015   Procedure: STONE EXTRACTION WITH BASKET;  Surgeon: Irine Seal, MD;  Location: [facility];  Service: Urology;  Laterality: Left;   TOTAL KNEE ARTHROPLASTY Right 2009   URETEROSCOPY  10/28/2015   Procedure: DIAGNOSTIC LEFT URETEROSCOPY;  Surgeon: Cleon Gustin, MD;  Location: [facility];  Service: Urology;;   WISDOM TOOTH EXTRACTION        Social History:     Social History   Tobacco Use   Smoking status: Every Day    Packs/day: 1.00    Years: 63.00    Pack years: 63.00    Types: Cigars, Cigarettes   Smokeless tobacco: Former    Types: Chew    Quit date: 01/03/1993   Tobacco comments:    Quit smoking cigarettes over 10 years ago- smoke 1.5 pack per day 60 years  Substance Use Topics   Alcohol use: No     Lives -lives with son at home, ambulates with cane and walker      Family History :     Family History  Problem Relation Age of Onset   Diabetes Mother    Heart disease Mother    Stroke Mother    CAD Mother    Diabetes Father    Congestive Heart Failure Father    Cancer Brother    Heart disease Son    COPD Son    Emphysema Son    Heart attack Brother       Home Medications:   Prior to Admission medications   Medication  Sig Start Date End Date Taking? Authorizing Provider  lactulose (CHRONULAC) 10 GM/15ML solution TAKE 1 TABLESPOONFUL BY MOUTH EVERY OTHER DAY 02/10/21  Yes [provider]  Propylene Glycol 0.6 % SOLN  INSTILL 1 DROP IN BOTH EYES FOUR TIMES A DAY FOR WATERY EYES 03/31/21  Yes [provider]  acetaminophen (TYLENOL) 325 MG tablet Take 2 tablets (650 mg total) by mouth every 6 (six) hours as needed for mild pain. 01/28/21   Angiulli, Lavon Paganini, PA-C  amLODipine (NORVASC) 5 MG tablet Take 1 tablet (5 mg total) by mouth daily. 01/29/21   Angiulli, Lavon Paganini, PA-C  aspirin EC 81 MG tablet Take 1 tablet (81 mg total) by mouth daily. 01/28/21   Angiulli, Lavon Paganini, PA-C  atorvastatin (LIPITOR) 10 MG tablet Take 1 tablet (10 mg total) by mouth daily. 01/28/21   Angiulli, Lavon Paganini, PA-C  lactulose, encephalopathy, (CHRONULAC) 10 GM/15ML SOLN Take 15 mLs (10 g total) by mouth every other day. 01/28/21   Angiulli, Lavon Paganini, PA-C  loratadine (CLARITIN) 10 MG tablet Take 1 tablet (10 mg total) by mouth daily. 01/28/21   Angiulli, Lavon Paganini, PA-C  melatonin 3 MG TABS tablet Take 2 tablets (6 mg total) by mouth at bedtime. 01/28/21   Angiulli, Lavon Paganini, PA-C  Multiple Vitamin (MULTIVITAMIN WITH MINERALS) TABS tablet Take 1 tablet by mouth daily. 01/28/21   Angiulli, Lavon Paganini, PA-C  oxyCODONE (OXY IR/ROXICODONE) 5 MG immediate release tablet oxycodone 5 mg tablet  1 q 6 hrs prn pain    [provider]  pantoprazole (PROTONIX) 40 MG tablet Take 1 tablet (40 mg total) by mouth 2 (two) times daily. 01/28/21   Angiulli, Lavon Paganini, PA-C  rifaximin (XIFAXAN) 550 MG TABS tablet Take 1 tablet (550 mg total) by mouth 2 (two) times daily. 01/28/21   Angiulli, Lavon Paganini, PA-C     Allergies:    No Known Allergies   Physical Exam:   Vitals  Blood pressure (!) 152/87, pulse 100, temperature 98 F (36.7 C), temperature source Oral, resp. rate 16, height 5\' 7"  (1.702 m), weight 68 kg, SpO2 95 %.   1. General  well-developed male lying in bed in NAD,   2. Normal affect and insight, Not Suicidal or Homicidal, Awake Alert, Oriented X 3.  3. No F.N deficits, ALL C.Nerves Intact, Strength 5/5 all 4 extremities, Sensation intact all 4 extremities, Plantars down going.  4. Ears and Eyes appear Normal, Conjunctivae clear, PERRLA. Moist Oral Mucosa.  5. Supple Neck, No JVD, No cervical lymphadenopathy appriciated, No Carotid Bruits.  6. Symmetrical Chest wall movement, Good air movement bilaterally, CTAB.  7. RRR, No Gallops, Rubs or Murmurs, No Parasternal Heave.  8. Positive Bowel Sounds, Abdomen Soft, No tenderness, No organomegaly appriciated,No rebound -guarding or rigidity.  9.  No Cyanosis, Normal Skin Turgor, No Skin Rash or Bruise.  10. Good muscle tone,  joints appear normal , no effusions, good pulses bilaterally, left lower extremity movement limited due to pain  11. No Palpable Lymph Nodes in Neck or Axillae     Data Review:    CBC No results for input(s): WBC, HGB, HCT, PLT, MCV, MCH, MCHC, RDW, LYMPHSABS, MONOABS, EOSABS, BASOSABS, BANDABS in the last 168 hours.  Invalid input(s): NEUTRABS, BANDSABD ------------------------------------------------------------------------------------------------------------------  Chemistries  No results for input(s): NA, K, CL, CO2, GLUCOSE, BUN, CREATININE, CALCIUM, MG, AST, ALT, ALKPHOS, BILITOT in the last 168 hours.  Invalid input(s): GFRCGP ------------------------------------------------------------------------------------------------------------------ CrCl cannot be calculated (Patient's most recent lab result is older than the maximum 21 days allowed.). ------------------------------------------------------------------------------------------------------------------ No results for input(s): TSH, T4TOTAL, T3FREE, THYROIDAB in the last 72 hours.  Invalid input(s): FREET3  Coagulation profile No results for input(s): INR,  PROTIME in  the last 168 hours. ------------------------------------------------------------------------------------------------------------------- No results for input(s): DDIMER in the last 72 hours. -------------------------------------------------------------------------------------------------------------------  Cardiac Enzymes No results for input(s): CKMB, TROPONINI, MYOGLOBIN in the last 168 hours.  Invalid input(s): CK ------------------------------------------------------------------------------------------------------------------ No results found for: BNP   ---------------------------------------------------------------------------------------------------------------  Urinalysis    Component Value Date/Time   COLORURINE YELLOW 12/29/2020 [place] 12/29/2020 1012   LABSPEC 1.015 12/29/2020 1012   PHURINE 5.5 12/29/2020 1012   GLUCOSEU 100 (A) 12/29/2020 1012   HGBUR NEGATIVE 12/29/2020 1012   BILIRUBINUR NEGATIVE 12/29/2020 1012   BILIRUBINUR negative 02/17/2015 1336   KETONESUR NEGATIVE 12/29/2020 1012   PROTEINUR NEGATIVE 12/29/2020 1012   UROBILINOGEN negative 02/17/2015 1336   UROBILINOGEN 0.2 01/27/2014 2028   NITRITE NEGATIVE 12/29/2020 1012   LEUKOCYTESUR NEGATIVE 12/29/2020 1012    ----------------------------------------------------------------------------------------------------------------   Imaging Results:    DG Knee 1-2 Views Left  Result Date: 05/27/2021 CLINICAL DATA:  Fall, left leg pain EXAM: LEFT KNEE - 1-2 VIEW COMPARISON:  None Available. FINDINGS: No evidence of fracture, dislocation, or joint effusion. No evidence of arthropathy or other focal bone abnormality. Soft tissues are unremarkable. IMPRESSION: Negative. Electronically Signed   By: Rolm Baptise M.D.   On: 05/27/2021 20:52   DG Hip Unilat With Pelvis 2-3 Views Left  Result Date: 05/27/2021 CLINICAL DATA:  fall, left hip pain EXAM: DG HIP (WITH OR WITHOUT PELVIS) 2-3V LEFT  COMPARISON:  CT 01/03/2021 FINDINGS: There is an acute, displaced left intertrochanteric femur fracture. Moderate osteoarthritis of the hips. Bilateral SI joint and lower lumbar spine degenerative changes. Vascular calcifications. IMPRESSION: Acute, displaced left intertrochanteric femur fracture. Electronically Signed   By: Maurine Simmering M.D.   On: 05/27/2021 19:08    My personal review of EKG: Pending > addendum  reading of the EKG reading as A-fib, but it is not A-fib, it is sinus rhythm with multiple PACs(I have discussed with this with cardiology who confirmed this is sinus with PACs)   Assessment & Plan:    Principal Problem:   Hip fracture ([place]) Active Problems:   Diabetes mellitus ([place])   Hyperlipidemia associated with type 2 diabetes mellitus ([place])   Hypertension associated with diabetes ([place])   Vitamin D deficiency   Single kidney   COPD (chronic obstructive pulmonary disease) ([place])   Malignant neoplasm of upper lobe of left lung (H[facility])   Malignant melanoma ([place])   CVA (cerebral vascular accident) ([place])   Left hip fracture -Secondary to mechanical fall, admitted under hip fracture pathway -Continue with as needed oxycodone and Dilaudid for pain -DVT prophylaxis subcu heparin to start tomorrow -Continue with bowel regimen. -Lab still pending, EKG still pending at time of admission. -Dr. Ander Slade  has been consulted by ED, requesting transfer to [facility]. Addendum: Blood work is back, significant for normal INR, and LFTs, only mild thrombocytopenia, EKG is nonacute as well, no further work-up indicated , patient is optimized for surgery for tomorrow.  Hepatic cirrhosis/esophageal varices -[place] on CMP, INR,CBC -Continue with rifaximin  History of CVA -Continue with aspirin and statin  Diabetes mellitus type 2 -Check A1c, will keep an insulin sliding scale  Hypertension -Continue with home medications  History of lung cancer History of metastatic  recurrent melanoma Urothelial CA -Is been followed by Dr. Darnell Level in [place] -He was counseled  Thrombocytopenia -Due to liver cirrhosis, no indication for transfusion   DVT Prophylaxis Heparin   AM Labs Ordered, also please review Full  Orders  Family Communication: Admission, patients condition and plan of care including tests being ordered have been discussed with the patient  who indicate understanding and agree with the plan and Code Status.  Code Status   Likely DC to  likely SNF  Condition GUARDED    Consults called: ortho Dr Ander Slade by ED    Admission status: inpatient    Time spent in minutes : 65 minutes   Phillips Climes M.D on 05/27/2021 at 8:58 PM   [facility] - Office  [phone]

## 2021-05-27 NOTE — ED Triage Notes (Signed)
Pt brought in by [facility] from home with c/o left hip pain after a fall today. Pt reports he was reaching over to shut the toilet lid and he lost his balance causing him to fall. Pt is unable to straighten his left leg and is unable to bear weight.

## 2021-05-28 ENCOUNTER — Inpatient Hospital Stay (HOSPITAL_COMMUNITY): Payer: No Typology Code available for payment source | Admitting: Registered Nurse

## 2021-05-28 ENCOUNTER — Encounter (HOSPITAL_COMMUNITY)
Admission: EM | Disposition: A | Discharge status: Rehabilitation Facility | Payer: Self-pay | Source: Home / Self Care | Attending: Internal Medicine

## 2021-05-28 ENCOUNTER — Inpatient Hospital Stay (HOSPITAL_COMMUNITY): Payer: No Typology Code available for payment source

## 2021-05-28 ENCOUNTER — Other Ambulatory Visit: Payer: Self-pay

## 2021-05-28 DIAGNOSIS — S72142A Displaced intertrochanteric fracture of left femur, initial encounter for closed fracture: Secondary | ICD-10-CM | POA: Diagnosis not present

## 2021-05-28 DIAGNOSIS — F419 Anxiety disorder, unspecified: Secondary | ICD-10-CM | POA: Diagnosis not present

## 2021-05-28 DIAGNOSIS — I10 Essential (primary) hypertension: Secondary | ICD-10-CM | POA: Diagnosis not present

## 2021-05-28 DIAGNOSIS — E119 Type 2 diabetes mellitus without complications: Secondary | ICD-10-CM | POA: Diagnosis not present

## 2021-05-28 DIAGNOSIS — S72002A Fracture of unspecified part of neck of left femur, initial encounter for closed fracture: Secondary | ICD-10-CM | POA: Diagnosis not present

## 2021-05-28 HISTORY — PX: INTRAMEDULLARY (IM) NAIL INTERTROCHANTERIC: SHX5875

## 2021-05-28 LAB — CBC
HCT: 34.6 % — ABNORMAL LOW (ref 39.0–52.0)
Hemoglobin: 11.1 g/dL — ABNORMAL LOW (ref 13.0–17.0)
MCH: 28.8 pg (ref 26.0–34.0)
MCHC: 32.1 g/dL (ref 30.0–36.0)
MCV: 89.9 fL (ref 80.0–100.0)
Platelets: 93 10*3/uL — ABNORMAL LOW (ref 150–400)
RBC: 3.85 MIL/uL — ABNORMAL LOW (ref 4.22–5.81)
RDW: 16.5 % — ABNORMAL HIGH (ref 11.5–15.5)
WBC: 8.4 10*3/uL (ref 4.0–10.5)
nRBC: 0 % (ref 0.0–0.2)

## 2021-05-28 LAB — BASIC METABOLIC PANEL
Anion gap: 5 (ref 5–15)
BUN: 20 mg/dL (ref 8–23)
CO2: 22 mmol/L (ref 22–32)
Calcium: 8.8 mg/dL — ABNORMAL LOW (ref 8.9–10.3)
Chloride: 109 mmol/L (ref 98–111)
Creatinine, Ser: 0.93 mg/dL (ref 0.61–1.24)
GFR, Estimated: >60 mL/min (ref >60)
Glucose, Bld: 233 mg/dL — ABNORMAL HIGH (ref 70–99)
Potassium: 4.1 mmol/L (ref 3.5–5.1)
Sodium: 136 mmol/L (ref 135–145)

## 2021-05-28 LAB — GLUCOSE, CAPILLARY
Glucose-Capillary: 179 mg/dL — ABNORMAL HIGH (ref 70–99)
Glucose-Capillary: 189 mg/dL — ABNORMAL HIGH (ref 70–99)
Glucose-Capillary: 189 mg/dL — ABNORMAL HIGH (ref 70–99)
Glucose-Capillary: 243 mg/dL — ABNORMAL HIGH (ref 70–99)

## 2021-05-28 LAB — HEMOGLOBIN A1C
Hgb A1c MFr Bld: 6.4 % — ABNORMAL HIGH (ref 4.8–5.6)
Mean Plasma Glucose: 136.98 mg/dL

## 2021-05-28 LAB — CBG MONITORING, ED: Glucose-Capillary: 208 mg/dL — ABNORMAL HIGH (ref 70–99)

## 2021-05-28 SURGERY — FIXATION, FRACTURE, INTERTROCHANTERIC, WITH INTRAMEDULLARY ROD
Anesthesia: General | Site: Leg Upper | Laterality: Left

## 2021-05-28 MED ORDER — PROPOFOL 10 MG/ML IV BOLUS
INTRAVENOUS | Status: DC | PRN
  Administered 2021-05-28: 140 mg via INTRAVENOUS

## 2021-05-28 MED ORDER — ONDANSETRON HCL 4 MG/2ML IJ SOLN
INTRAMUSCULAR | Status: DC | PRN
  Administered 2021-05-28: 4 mg via INTRAVENOUS

## 2021-05-28 MED ORDER — 0.9 % SODIUM CHLORIDE (POUR BTL) OPTIME
TOPICAL | Status: DC | PRN
  Administered 2021-05-28: 1000 mL

## 2021-05-28 MED ORDER — PHENYLEPHRINE HCL-NACL 20-0.9 MG/250ML-% IV SOLN
INTRAVENOUS | Status: DC | PRN
  Administered 2021-05-28: 30 ug/min via INTRAVENOUS

## 2021-05-28 MED ORDER — FENTANYL CITRATE (PF) 250 MCG/5ML IJ SOLN
INTRAMUSCULAR | Status: AC
  Filled 2021-05-28: qty 5

## 2021-05-28 MED ORDER — LIDOCAINE 2% (20 MG/ML) 5 ML SYRINGE
INTRAMUSCULAR | Status: DC | PRN
  Administered 2021-05-28: 60 mg via INTRAVENOUS

## 2021-05-28 MED ORDER — ENOXAPARIN SODIUM 40 MG/0.4ML IJ SOSY: 40.0000 mg | PREFILLED_SYRINGE | INTRAMUSCULAR | Status: DC

## 2021-05-28 MED ORDER — ACETAMINOPHEN 10 MG/ML IV SOLN
INTRAVENOUS | Status: AC
  Filled 2021-05-28: qty 100

## 2021-05-28 MED ORDER — ORAL CARE MOUTH RINSE: 15.0000 mL | Freq: Once | OROMUCOSAL | Status: AC

## 2021-05-28 MED ORDER — PHENYLEPHRINE 80 MCG/ML (10ML) SYRINGE FOR IV PUSH (FOR BLOOD PRESSURE SUPPORT)
PREFILLED_SYRINGE | INTRAVENOUS | Status: DC | PRN
  Administered 2021-05-28 (×2): 80 ug via INTRAVENOUS

## 2021-05-28 MED ORDER — CEFAZOLIN SODIUM-DEXTROSE 2-3 GM-%(50ML) IV SOLR
INTRAVENOUS | Status: DC | PRN
  Administered 2021-05-28: 2 g via INTRAVENOUS

## 2021-05-28 MED ORDER — CHLORHEXIDINE GLUCONATE 0.12 % MT SOLN
OROMUCOSAL | Status: AC
  Administered 2021-05-28: 15 mL via OROMUCOSAL
  Filled 2021-05-28: qty 15

## 2021-05-28 MED ORDER — VANCOMYCIN HCL 1000 MG IV SOLR
INTRAVENOUS | Status: AC
  Filled 2021-05-28: qty 20

## 2021-05-28 MED ORDER — INSULIN ASPART 100 UNIT/ML IJ SOLN: 0.0000 [IU] | INTRAMUSCULAR | Status: DC | PRN

## 2021-05-28 MED ORDER — STERILE WATER FOR IRRIGATION IR SOLN
Status: DC | PRN
  Administered 2021-05-28: 1000 mL

## 2021-05-28 MED ORDER — CEFAZOLIN SODIUM-DEXTROSE 2-4 GM/100ML-% IV SOLN
2.0000 g | Freq: Three times a day (TID) | INTRAVENOUS | Status: AC
  Administered 2021-05-28 – 2021-05-29 (×3): 2 g via INTRAVENOUS
  Filled 2021-05-28 (×3): qty 100

## 2021-05-28 MED ORDER — FENTANYL CITRATE (PF) 250 MCG/5ML IJ SOLN
INTRAMUSCULAR | Status: DC | PRN
Start: 2021-05-28 — End: 2021-05-28
  Administered 2021-05-28: 25 ug via INTRAVENOUS
  Administered 2021-05-28: 100 ug via INTRAVENOUS

## 2021-05-28 MED ORDER — LACTATED RINGERS IV SOLN: INTRAVENOUS | Status: DC

## 2021-05-28 MED ORDER — ACETAMINOPHEN 10 MG/ML IV SOLN
1000.0000 mg | Freq: Once | INTRAVENOUS | Status: DC | PRN
  Administered 2021-05-28: 1000 mg via INTRAVENOUS

## 2021-05-28 MED ORDER — CHLORHEXIDINE GLUCONATE CLOTH 2 % EX PADS
6.0000 | MEDICATED_PAD | Freq: Every day | CUTANEOUS | Status: DC
  Administered 2021-05-28 – 2021-06-01 (×4): 6 via TOPICAL

## 2021-05-28 MED ORDER — FENTANYL CITRATE (PF) 100 MCG/2ML IJ SOLN
25.0000 ug | INTRAMUSCULAR | Status: DC | PRN
  Administered 2021-05-28: 50 ug via INTRAVENOUS

## 2021-05-28 MED ORDER — PROPOFOL 10 MG/ML IV BOLUS
INTRAVENOUS | Status: AC
  Filled 2021-05-28: qty 20

## 2021-05-28 MED ORDER — CEFAZOLIN SODIUM-DEXTROSE 2-4 GM/100ML-% IV SOLN
INTRAVENOUS | Status: AC
  Filled 2021-05-28: qty 100

## 2021-05-28 MED ORDER — CHLORHEXIDINE GLUCONATE 0.12 % MT SOLN: 15.0000 mL | Freq: Once | OROMUCOSAL | Status: AC

## 2021-05-28 MED ORDER — LACTULOSE 10 GM/15ML PO SOLN
10.0000 g | ORAL | Status: DC
  Filled 2021-05-28 (×4): qty 15

## 2021-05-28 MED ORDER — FENTANYL CITRATE (PF) 100 MCG/2ML IJ SOLN
INTRAMUSCULAR | Status: AC
  Filled 2021-05-28: qty 2

## 2021-05-28 SURGICAL SUPPLY — 53 items
ADH SKN CLS APL DERMABOND .7 (GAUZE/BANDAGES/DRESSINGS) ×1
APL PRP STRL LF DISP 70% ISPRP (MISCELLANEOUS) ×1
BAG COUNTER SPONGE SURGICOUNT (BAG) IMPLANT
BAG SPNG CNTER NS LX DISP (BAG)
BIT DRILL INTERTAN LAG SCREW (BIT) ×1 IMPLANT
BIT DRILL LONG 4.0 (BIT) IMPLANT
BRUSH SCRUB EZ PLAIN DRY (MISCELLANEOUS) ×3 IMPLANT
CHLORAPREP W/TINT 26 (MISCELLANEOUS) ×2 IMPLANT
COVER PERINEAL POST (MISCELLANEOUS) ×2 IMPLANT
COVER SURGICAL LIGHT HANDLE (MISCELLANEOUS) ×2 IMPLANT
DERMABOND ADVANCED (GAUZE/BANDAGES/DRESSINGS) ×1
DERMABOND ADVANCED .7 DNX12 (GAUZE/BANDAGES/DRESSINGS) ×1 IMPLANT
DRAPE C-ARM 35X43 STRL (DRAPES) ×2 IMPLANT
DRAPE IMP U-DRAPE 54X76 (DRAPES) ×4 IMPLANT
DRAPE INCISE IOBAN 66X45 STRL (DRAPES) ×2 IMPLANT
DRAPE STERI IOBAN 125X83 (DRAPES) ×2 IMPLANT
DRAPE SURG 17X23 STRL (DRAPES) ×4 IMPLANT
DRAPE U-SHAPE 47X51 STRL (DRAPES) ×2 IMPLANT
DRESSING MEPILEX FLEX 4X4 (GAUZE/BANDAGES/DRESSINGS) IMPLANT
DRILL BIT LONG 4.0 (BIT) ×2
DRSG MEPILEX BORDER 4X4 (GAUZE/BANDAGES/DRESSINGS) ×2 IMPLANT
DRSG MEPILEX BORDER 4X8 (GAUZE/BANDAGES/DRESSINGS) ×2 IMPLANT
DRSG MEPILEX FLEX 4X4 (GAUZE/BANDAGES/DRESSINGS) ×2
ELECT REM PT RETURN 9FT ADLT (ELECTROSURGICAL) ×2
ELECTRODE REM PT RTRN 9FT ADLT (ELECTROSURGICAL) ×1 IMPLANT
GLOVE BIO SURGEON STRL SZ 6.5 (GLOVE) ×5 IMPLANT
GLOVE BIO SURGEON STRL SZ7.5 (GLOVE) ×6 IMPLANT
GLOVE BIOGEL PI IND STRL 6.5 (GLOVE) ×1 IMPLANT
GLOVE BIOGEL PI IND STRL 7.5 (GLOVE) ×1 IMPLANT
GLOVE BIOGEL PI INDICATOR 6.5 (GLOVE) ×1
GLOVE BIOGEL PI INDICATOR 7.5 (GLOVE) ×1
GOWN STRL REUS W/ TWL LRG LVL3 (GOWN DISPOSABLE) ×1 IMPLANT
GOWN STRL REUS W/TWL LRG LVL3 (GOWN DISPOSABLE) ×2
GUIDE PIN 3.2X343 (PIN) ×2
GUIDE PIN 3.2X343MM (PIN) ×4
KIT BASIN OR (CUSTOM PROCEDURE TRAY) ×2 IMPLANT
KIT TURNOVER KIT B (KITS) ×2 IMPLANT
MANIFOLD NEPTUNE II (INSTRUMENTS) ×1 IMPLANT
NAIL INTERTAN 10X18 130D 10S (Nail) ×1 IMPLANT
NS IRRIG 1000ML POUR BTL (IV SOLUTION) ×2 IMPLANT
PACK GENERAL/GYN (CUSTOM PROCEDURE TRAY) ×2 IMPLANT
PAD ARMBOARD 7.5X6 YLW CONV (MISCELLANEOUS) ×4 IMPLANT
PIN GUIDE 3.2X343MM (PIN) IMPLANT
SCREW LAG COMPR KIT 95/90 (Screw) ×1 IMPLANT
SCREW TRIGEN LOW PROF 5.0X35 (Screw) ×1 IMPLANT
SUT MNCRL AB 3-0 PS2 18 (SUTURE) ×2 IMPLANT
SUT MNCRL AB 3-0 PS2 27 (SUTURE) ×1 IMPLANT
SUT VIC AB 0 CT1 27 (SUTURE)
SUT VIC AB 0 CT1 27XBRD ANBCTR (SUTURE) IMPLANT
SUT VIC AB 2-0 CT1 27 (SUTURE) ×2
SUT VIC AB 2-0 CT1 TAPERPNT 27 (SUTURE) ×2 IMPLANT
TOWEL GREEN STERILE (TOWEL DISPOSABLE) ×3 IMPLANT
WATER STERILE IRR 1000ML POUR (IV SOLUTION) ×2 IMPLANT

## 2021-05-28 NOTE — Progress Notes (Signed)
Initial Nutrition Assessment  DOCUMENTATION CODES:   Not applicable  INTERVENTION:  RD will follow diet advancement and make recommendations as appropriate  NUTRITION DIAGNOSIS:   Inadequate oral intake related to inability to eat as evidenced by NPO status.   GOAL:  Patient will meet greater than or equal to 90% of their needs   MONITOR:  Diet advancement, Supplement acceptance, PO intake, Weight trends, Skin, Labs  REASON FOR ASSESSMENT:   Consult Hip fracture protocol  ASSESSMENT: Patient is a 76 yo male with history of DM 2, Cirrhosis, renal cell cancer, metastatic melanoma left lung.   Mechanical fall resulting in left intertrochanteric femur fracture. [facility] due to state of his injury. Plans for cephalomedullary nailing per orthopaedic note.   NPO for surgery. When appropriate for diet advancement recommend CHO modified.  Weights: 01/29/21- 71.1 kg 05/27/21 -68.04 kg  Medications: colace, Insulin with meals and at bedtime), lipitor, lactulose, protonix, MVI     Latest Ref Rng & Units 05/28/2021    3:41 AM 05/27/2021    8:53 PM 03/10/2021    1:56 AM  BMP  Glucose 70 - 99 mg/dL 233   202   148    BUN 8 - 23 mg/dL 20   19   15     Creatinine 0.61 - 1.24 mg/dL 0.93   0.92   0.90    Sodium 135 - 145 mmol/L 136   137   137    Potassium 3.5 - 5.1 mmol/L 4.1   4.1   3.5    Chloride 98 - 111 mmol/L 109   107   107    CO2 22 - 32 mmol/L 22   22   20     Calcium 8.9 - 10.3 mg/dL 8.8   9.2   9.3        NUTRITION - FOCUSED PHYSICAL EXAM: RD working remotely. Patient transferred from [facility] to [facility]    Diet Order:   Diet Order             Diet NPO time specified  Diet effective midnight                   EDUCATION NEEDS:  Not appropriate for education at this time  Skin:  Skin Assessment: Reviewed RN Assessment (Patient is scheduled for left hip surgery)  Last BM:  Prior to admission  Height:   Ht Readings from Last 1  Encounters:  05/27/21 5\' 7"  (1.702 m)    Weight:   Wt Readings from Last 1 Encounters:  05/27/21 68 kg    Ideal Body Weight:   67 kg   BMI:  Body mass index is 23.49 kg/m.  Estimated Nutritional Needs:   Kcal:  2100-2300  Protein:  88-102 gr  Fluid:  >/= 2 liters  Colman Cater MS,RD,CSG,LDN Contact: Shea Evans

## 2021-05-28 NOTE — Progress Notes (Signed)
[facility] notified of pt admission. [place] notification ID is [phone].

## 2021-05-28 NOTE — Op Note (Signed)
Orthopaedic Surgery Operative Note (CSN: [id number] ) Date of Surgery: 05/28/2021  Admit Date: 05/27/2021   Diagnoses: Pre-Op Diagnoses: Left intertrochanteric femur fracture  Post-Op Diagnosis: Same  Procedures: CPT 27245-Cephalomedullary nailing of left intertrochanteric femur fracture  Surgeons : Primary: Shona Needles, MD  Assistant: Patrecia Pace, PA-C  Location: OR 3   Anesthesia:General   Antibiotics: Ancef 2g preop   Tourniquet time: None    Estimated Blood PPIR:51 mL  Complications:None  Specimens:None   Implants: Implant Name Type Inv. Item Serial No. Manufacturer Lot No. LRB No. Used Action  NAIL INTERTAN 10X18 130D 10S - [id number] Nail NAIL INTERTAN 10X18 130D 10S  [employer] [id number] Left 1 Implanted  SCREW LAG COMPR KIT 95/90 - [id number] Screw SCREW LAG COMPR KIT 95/90  [employer] [id number] Left 1 Implanted  SCREW TRIGEN LOW PROF 5.0X35 - [id number] Screw SCREW TRIGEN LOW PROF 5.0X35  [employer] [id number] Left 1 Implanted     Indications for Surgery: 76 year old male who had a ground-level fall with a left intertrochanteric femur fracture.  Due to the unstable nature of his injury I recommended proceeding with cephalomedullary nailing of the left hip.  Risks and benefits were discussed with the patient.  Risks include but not limited to bleeding, infection, malunion, nonunion, hardware failure, hardware irritation, nerve or blood vessel injury, DVT, even the possibility anesthetic complications.  He agreed to proceed with surgery and consent was obtained.  Operative Findings: Cephalomedullary nailing of left intertrochanteric femur fracture using [employer] InterTAN 10 x 180 mm nail with a 95 mm lag screw and 90 mm compression screw  Procedure: The patient was identified in the preoperative holding area. Consent was confirmed with the patient and their family and all questions were answered. The  operative extremity was marked after confirmation with the patient. he was then brought back to the operating room by our anesthesia colleagues.  He was placed under general anesthetic and carefully transferred over to the [facility] table.  All bony prominences were well-padded.  Traction was applied to the left lower extremity and alignment was obtained of the left hip fracture.  The left lower extremity was then prepped and draped in usual sterile fashion.  A timeout was performed to verify the patient, the procedure, and the extremity.  Preoperative antibiotics were dosed.  A small incision proximal to the greater trochanter was made and carried down through skin and subcutaneous tissue.  Threaded guidewire was placed at the tip of the greater trochanter advanced into the proximal metaphysis.  An entry reamer was used to enter the medullary canal.  A 10 x 180 mm InterTAN nail was then passed down the center canal attached to a targeting arm.  A percutaneous incision laterally was made to direct a threaded guidewire into the head/neck segment.  I confirmed adequate tip apex distance using fluoroscopy.  I then drilled the path for the compression screw and placed an antirotation bar.  I then drilled the path for the lag screw.  The lag screw was placed and I compressed approximately 3 to 4 mm using the compression screw.  I then statically locked the proximal portion of the nail.  The targeting arm was used to place a distal interlocking screw.  Final fluoroscopic imaging was obtained after the targeting arm was removed.  The incision was copiously irrigated.  They are closed with 2-0 Vicryl and 3-0 Monocryl with Dermabond.  Sterile dressings were applied.  Patient was then  awoken from anesthesia and taken to the PACU in stable condition.  Post Op Plan/Instructions: Patient be weightbearing as tolerated to left lower extremity.  He will receive postoperative Ancef.  He will receive Lovenox for DVT prophylaxis.  I  will recommend a DOAC upon discharge from the hospital for 30 days.  I was present and performed the entire surgery.  Patrecia Pace, PA-C did assist me throughout the case. An assistant was necessary given the difficulty in approach, maintenance of reduction and ability to instrument the fracture.   Katha Hamming, MD [facility]

## 2021-05-28 NOTE — Transfer of Care (Signed)
Immediate Anesthesia Transfer of Care Note  Patient: Dalton Miller Born  Procedure(s) Performed: INTRAMEDULLARY (IM) NAIL INTERTROCHANTRIC (Left: Leg Upper)  Patient Location: PACU  Anesthesia Type:General  Level of Consciousness: awake, alert , oriented and drowsy  Airway & Oxygen Therapy: Patient Spontanous Breathing and Patient connected to nasal cannula oxygen  Post-op Assessment: Report given to RN and Post -op Vital signs reviewed and stable  Post vital signs: Reviewed and stable  Last Vitals:  Vitals Value Taken Time  BP 160/84 05/28/21 1333  Temp    Pulse 102 05/28/21 1337  Resp 15 05/28/21 1337  SpO2 95 % 05/28/21 1337  Vitals shown include unvalidated device data.  Last Pain:  Vitals:   05/28/21 1029  TempSrc:   PainSc: 8          Complications: No notable events documented.

## 2021-05-28 NOTE — Consult Note (Signed)
Orthopaedic Trauma Service (OTS) Consult   Patient ID: Dalton Miller MRN: [id number] DOB/AGE: Nov 15, 1945 76 y.o.  Reason for Consult:Left intertrochanteric femur fracture Referring Physician: Dr. Donata Duff, MD Raliegh Ip  HPI: Dalton Miller is an 76 y.o. male who is being seen in consultation at the request of Dr. Zachery Dakins.  Patient with a multiple medical comorbidities who presents after mechanical fall.  He had immediate pain and inability to bear weight on the left side.  He presented to [facility] emergency room where x-rays showed a left intertrochanteric femur fracture.  Due to the unstable nature of his injury he was recommended to transfer down to [facility] for evaluation and treatment.  Due to the OR availability Dr. Zachery Dakins recommended treatment by an orthopedic traumatologist.  Patient was seen and evaluated in the preoperative holding area.  Currently having a lot of pain.  He ambulates at baseline with a walker or cane.  He smokes about a pack of cigarettes a day.  Does not have any home oxygen use.  Lives with his son and his girlfriend.  Past Medical History:  Diagnosis Date   Acquired renal cyst of right kidney    Arthritis    Cancer ([place])    renal cell    Cirrhosis ([place])    Erectile dysfunction    Heart murmur    CHILDHOOD   Hematuria    left ureteral bleeding   History of cellulitis    2012 left wrist   History of kidney stones    Hyperlipidemia    Hypertension    Metastatic melanoma to lung, left ([place])    Nephrolithiasis    bilateral non-obstructive per ct 07-02-2015   Type 2 diabetes mellitus ([place])    Wears glasses     Past Surgical History:  Procedure Laterality Date   CARPAL TUNNEL RELEASE Right 1990  approx   COLONOSCOPY  03/04/2010   CYSTOSCOPY W/ RETROGRADES Left 10/28/2015   Procedure: CYSTOSCOPY WITH LEFT RETROGRADE PYELOGRAM;  Surgeon: Cleon Gustin, MD;  Location: [facility];  Service: Urology;  Laterality: Left;   CYSTOSCOPY W/  URETERAL STENT PLACEMENT Left 10/28/2015   Procedure: CYSTOSCOPY WITH LEFT URETERAL STENT EXCHANGE;  Surgeon: Cleon Gustin, MD;  Location: [facility];  Service: Urology;  Laterality: Left;   CYSTOSCOPY WITH RETROGRADE PYELOGRAM, URETEROSCOPY AND STENT PLACEMENT Bilateral 09/17/2015   Procedure: CYSTOSCOPY WITH BILATERAL RETROGRADE PYELOGRAM, LEFT URETEROSCOPY WITH URETERAL BIOPSY AND  STENT PLACEMENT;  Surgeon: Irine Seal, MD;  Location: [facility];  Service: Urology;  Laterality: Bilateral;   ESOPHAGEAL BANDING  12/30/2020   Procedure: ESOPHAGEAL BANDING;  Surgeon: Ladene Artist, MD;  Location: [facility] ENDOSCOPY;  Service: Endoscopy;;   ESOPHAGOGASTRODUODENOSCOPY (EGD) WITH PROPOFOL N/A 12/30/2020   Procedure: ESOPHAGOGASTRODUODENOSCOPY (EGD) WITH PROPOFOL;  Surgeon: Ladene Artist, MD;  Location: [facility] ENDOSCOPY;  Service: Endoscopy;  Laterality: N/A;   HOLMIUM LASER APPLICATION Left 37/16/9678   Procedure: HOLMIUM LASER APPLICATION;  Surgeon: Irine Seal, MD;  Location: [facility];  Service: Urology;  Laterality: Left;   KNEE ARTHROSCOPY Right 2008   MELANOMA EXCISION  2018   Left arm   right total knee replacement  Right    2009   ROBOT ASSITED LAPAROSCOPIC NEPHROURETERECTOMY Left 12/04/2015   Procedure: XI ROBOT ASSITED LAPAROSCOPIC NEPHROURETERECTOMY;  Surgeon: Cleon Gustin, MD;  Location: [facility];  Service: Urology;  Laterality: Left;   ROTATOR CUFF REPAIR Right    SHOULDER ARTHROSCOPY Right 12/17/2020   Procedure:  Right shoulder arthroscopy, debridement, subacromial decompression, distal clavicle resection;  Surgeon: Justice Britain, MD;  Location: [facility];  Service: Orthopedics;  Laterality: Right;  69min   STONE EXTRACTION WITH BASKET Left 09/17/2015   Procedure: STONE EXTRACTION WITH BASKET;  Surgeon: Irine Seal, MD;  Location: [facility];  Service: Urology;  Laterality: Left;   TOTAL KNEE ARTHROPLASTY Right 2009   URETEROSCOPY   10/28/2015   Procedure: DIAGNOSTIC LEFT URETEROSCOPY;  Surgeon: Cleon Gustin, MD;  Location: [facility];  Service: Urology;;   WISDOM TOOTH EXTRACTION      Family History  Problem Relation Age of Onset   Diabetes Mother    Heart disease Mother    Stroke Mother    CAD Mother    Diabetes Father    Congestive Heart Failure Father    Cancer Brother    Heart disease Son    COPD Son    Emphysema Son    Heart attack Brother     Social History:  reports that he has been smoking cigars and cigarettes. He has a 63.00 pack-year smoking history. He quit smokeless tobacco use about 28 years ago.  His smokeless tobacco use included chew. He reports that he does not drink alcohol and does not use drugs.  Allergies: No Known Allergies  Medications:  No current facility-administered medications on file prior to encounter.   Current Outpatient Medications on File Prior to Encounter  Medication Sig Dispense Refill   acetaminophen (TYLENOL) 325 MG tablet Take 2 tablets (650 mg total) by mouth every 6 (six) hours as needed for mild pain.     amLODipine (NORVASC) 5 MG tablet Take 1 tablet (5 mg total) by mouth daily. 30 tablet 0   aspirin EC 81 MG tablet Take 1 tablet (81 mg total) by mouth daily. 90 tablet 1   atorvastatin (LIPITOR) 10 MG tablet Take 1 tablet (10 mg total) by mouth daily. 30 tablet 0   Propylene Glycol 0.6 % SOLN Place 1 drop into both eyes 4 (four) times daily.     lactulose, encephalopathy, (CHRONULAC) 10 GM/15ML SOLN Take 15 mLs (10 g total) by mouth every other day. (Patient not taking: Reported on 05/27/2021) 236 mL 0   loratadine (CLARITIN) 10 MG tablet Take 1 tablet (10 mg total) by mouth daily. (Patient not taking: Reported on 05/27/2021)     melatonin 3 MG TABS tablet Take 2 tablets (6 mg total) by mouth at bedtime. (Patient not taking: Reported on 05/27/2021) 60 tablet 0   Multiple Vitamin (MULTIVITAMIN WITH MINERALS) TABS tablet Take 1 tablet by mouth daily. (Patient not  taking: Reported on 05/27/2021)     pantoprazole (PROTONIX) 40 MG tablet Take 1 tablet (40 mg total) by mouth 2 (two) times daily. (Patient not taking: Reported on 05/27/2021) 60 tablet 0   rifaximin (XIFAXAN) 550 MG TABS tablet Take 1 tablet (550 mg total) by mouth 2 (two) times daily. (Patient not taking: Reported on 05/27/2021) 60 tablet 0     ROS: Constitutional: No fever or chills Vision: No changes in vision ENT: No difficulty swallowing CV: No chest pain Pulm: No SOB or wheezing GI: No nausea or vomiting GU: No urgency or inability to hold urine Skin: No poor wound healing Neurologic: No numbness or tingling Psychiatric: No depression or anxiety Heme: No bruising Allergic: No reaction to medications or food   Exam: Blood pressure (!) 166/84, pulse 93, temperature 99 F (37.2 C), temperature source Oral, resp. rate 17, height 5'  7" (1.702 m), weight 68 kg, SpO2 93 %. General: No acute distress Orientation: Awake alert and oriented x3 Mood and Affect: Cooperative and pleasant Gait: Unable to assess due to his fracture Coordination and balance: Within normal limits  Left lower extremity: Reveals skin without lesions.  Leg is shortened and externally rotated.  No deformity through the lower leg or tibia or ankle.  Patient is able to actively dorsiflex and plantarflex his foot and ankle.  He has palpable PT pulse.  He has a warm well-perfused foot with intact sensation to light touch.  Right lower extremity: Skin without lesions. No tenderness to palpation. Full painless ROM, full strength in each muscle groups without evidence of instability.   Medical Decision Making: Data: Imaging: X-rays show a comminuted left intertrochanteric femur fracture.  Labs:  Results for orders placed or performed during the hospital encounter of 05/27/21 (from the past 24 hour(s))  CBC     Status: Abnormal   Collection Time: 05/27/21  8:53 PM  Result Value Ref Range   WBC 7.6 4.0 - 10.5 K/uL    RBC 4.10 (L) 4.22 - 5.81 MIL/uL   Hemoglobin 11.6 (L) 13.0 - 17.0 g/dL   HCT 36.7 (L) 39.0 - 52.0 %   MCV 89.5 80.0 - 100.0 fL   MCH 28.3 26.0 - 34.0 pg   MCHC 31.6 30.0 - 36.0 g/dL   RDW 16.8 (H) 11.5 - 15.5 %   Platelets 95 (L) 150 - 400 K/uL   nRBC 0.0 0.0 - 0.2 %  Comprehensive metabolic panel     Status: Abnormal   Collection Time: 05/27/21  8:53 PM  Result Value Ref Range   Sodium 137 135 - 145 mmol/L   Potassium 4.1 3.5 - 5.1 mmol/L   Chloride 107 98 - 111 mmol/L   CO2 22 22 - 32 mmol/L   Glucose, Bld 202 (H) 70 - 99 mg/dL   BUN 19 8 - 23 mg/dL   Creatinine, Ser 0.92 0.61 - 1.24 mg/dL   Calcium 9.2 8.9 - 10.3 mg/dL   Total Protein 7.3 6.5 - 8.1 g/dL   Albumin 4.1 3.5 - 5.0 g/dL   AST 22 15 - 41 U/L   ALT 26 0 - 44 U/L   Alkaline Phosphatase 116 38 - 126 U/L   Total Bilirubin 0.9 0.3 - 1.2 mg/dL   GFR, Estimated >60 >60 mL/min   Anion gap 8 5 - 15  Protime-INR     Status: None   Collection Time: 05/27/21  8:53 PM  Result Value Ref Range   Prothrombin Time 13.1 11.4 - 15.2 seconds   INR 1.0 0.8 - 1.2  CBG monitoring, ED     Status: Abnormal   Collection Time: 05/27/21  9:51 PM  Result Value Ref Range   Glucose-Capillary 166 (H) 70 - 99 mg/dL  CBC     Status: Abnormal   Collection Time: 05/28/21  3:41 AM  Result Value Ref Range   WBC 8.4 4.0 - 10.5 K/uL   RBC 3.85 (L) 4.22 - 5.81 MIL/uL   Hemoglobin 11.1 (L) 13.0 - 17.0 g/dL   HCT 34.6 (L) 39.0 - 52.0 %   MCV 89.9 80.0 - 100.0 fL   MCH 28.8 26.0 - 34.0 pg   MCHC 32.1 30.0 - 36.0 g/dL   RDW 16.5 (H) 11.5 - 15.5 %   Platelets 93 (L) 150 - 400 K/uL   nRBC 0.0 0.0 - 0.2 %  Basic metabolic panel  Status: Abnormal   Collection Time: 05/28/21  3:41 AM  Result Value Ref Range   Sodium 136 135 - 145 mmol/L   Potassium 4.1 3.5 - 5.1 mmol/L   Chloride 109 98 - 111 mmol/L   CO2 22 22 - 32 mmol/L   Glucose, Bld 233 (H) 70 - 99 mg/dL   BUN 20 8 - 23 mg/dL   Creatinine, Ser 0.93 0.61 - 1.24 mg/dL   Calcium 8.8 (L)  8.9 - 10.3 mg/dL   GFR, Estimated >60 >60 mL/min   Anion gap 5 5 - 15  CBG monitoring, ED     Status: Abnormal   Collection Time: 05/28/21  7:56 AM  Result Value Ref Range   Glucose-Capillary 208 (H) 70 - 99 mg/dL  Glucose, capillary     Status: Abnormal   Collection Time: 05/28/21 10:24 AM  Result Value Ref Range   Glucose-Capillary 179 (H) 70 - 99 mg/dL     Imaging or Labs ordered: None  Medical history and chart was reviewed and case discussed with medical provider.  Assessment/Plan: 76 year old male with multiple medical comorbidities with a left intertrochanteric femur fracture.  Due to the unstable nature of his injury I recommend proceeding with cephalomedullary nailing.  Risks and benefits were discussed with the patient.  Risks included but not limited to bleeding, infection, malunion, nonunion, hardware failure, hardware irritation, nerve or blood vessel injury, DVT, even the possibility anesthetic complications.  He agreed to proceed with surgery and consent was obtained.  Shona Needles, MD [facility] [phone] (office) [email or web address]

## 2021-05-28 NOTE — Plan of Care (Signed)
  Problem: Education: Goal: Knowledge of General Education information will improve Description Including pain rating scale, medication(s)/side effects and non-pharmacologic comfort measures Outcome: Progressing   

## 2021-05-28 NOTE — Progress Notes (Signed)
Orthopedic Tech Progress Note Patient Details:  CECILIA NISHIKAWA 1945/10/13 [id number]  Patient ID: [name], male   DOB: 02-15-1945, 76 y.o.   MRN: [id number] No OHF; over age limit.  Vernona Rieger 05/28/2021, 6:21 PM

## 2021-05-28 NOTE — Anesthesia Preprocedure Evaluation (Signed)
Anesthesia Evaluation  Patient identified by MRN, date of birth, ID band Patient awake    Reviewed: Allergy & Precautions, NPO status , Patient's Chart, lab work & pertinent test results  Airway Mallampati: II  TM Distance: >3 FB Neck ROM: Full    Dental  (+) Poor Dentition, Missing, Edentulous Upper   Pulmonary COPD, Current Smoker and Patient abstained from smoking.,    Pulmonary exam normal        Cardiovascular hypertension, Pt. on medications  Rhythm:Regular Rate:Normal     Neuro/Psych Anxiety CVA    GI/Hepatic Neg liver ROS, GERD  Medicated,  Endo/Other  diabetes  Renal/GU   negative genitourinary   Musculoskeletal  (+) Arthritis ,   Abdominal Normal abdominal exam  (+)   Peds  Hematology negative hematology ROS (+)   Anesthesia Other Findings   Reproductive/Obstetrics                             Anesthesia Physical Anesthesia Plan  ASA: 3  Anesthesia Plan: General   Post-op Pain Management:    Induction: Intravenous  PONV Risk Score and Plan: 1 and Ondansetron, Dexamethasone and Treatment may vary due to age or medical condition  Airway Management Planned: LMA and Mask  Additional Equipment: None  Intra-op Plan:   Post-operative Plan: Extubation in OR  Informed Consent: I have reviewed the patients History and Physical, chart, labs and discussed the procedure including the risks, benefits and alternatives for the proposed anesthesia with the patient or authorized representative who has indicated his/her understanding and acceptance.     Dental advisory given  Plan Discussed with: CRNA  Anesthesia Plan Comments: (Lab Results      Component                Value               Date                      WBC                      8.4                 05/28/2021                HGB                      11.1 (L)            05/28/2021                HCT                       34.6 (L)            05/28/2021                MCV                      89.9                05/28/2021                PLT                      93 (L)  05/28/2021           Lab Results      Component                Value               Date                      NA                       136                 05/28/2021                K                        4.1                 05/28/2021                CO2                      22                  05/28/2021                GLUCOSE                  233 (H)             05/28/2021                BUN                      20                  05/28/2021                CREATININE               0.93                05/28/2021                CALCIUM                  8.8 (L)             05/28/2021                GFRNONAA                 >60                 05/28/2021          )        Anesthesia Quick Evaluation

## 2021-05-28 NOTE — Interval H&P Note (Signed)
History and Physical Interval Note:  05/28/2021 11:25 AM  Dalton Miller  has presented today for surgery, with the diagnosis of Left hip fracture.  The various methods of treatment have been discussed with the patient and family. After consideration of risks, benefits and other options for treatment, the patient has consented to  Procedure(s): INTRAMEDULLARY (IM) NAIL INTERTROCHANTRIC (Left) as a surgical intervention.  The patient's history has been reviewed, patient examined, no change in status, stable for surgery.  I have reviewed the patient's chart and labs.  Questions were answered to the patient's satisfaction.     Lennette Bihari P Pamala Hayman

## 2021-05-28 NOTE — H&P (View-Only) (Signed)
Orthopaedic Trauma Service (OTS) Consult   Patient ID: Dalton Miller MRN: [id number] DOB/AGE: 07-Nov-1945 76 y.o.  Reason for Consult:Left intertrochanteric femur fracture Referring Physician: Dr. Donata Duff, MD Raliegh Ip  HPI: Dalton Miller is an 76 y.o. male who is being seen in consultation at the request of Dr. Zachery Dakins.  Patient with a multiple medical comorbidities who presents after mechanical fall.  He had immediate pain and inability to bear weight on the left side.  He presented to [facility] emergency room where x-rays showed a left intertrochanteric femur fracture.  Due to the unstable nature of his injury he was recommended to transfer down to [facility] for evaluation and treatment.  Due to the OR availability Dr. Zachery Dakins recommended treatment by an orthopedic traumatologist.  Patient was seen and evaluated in the preoperative holding area.  Currently having a lot of pain.  He ambulates at baseline with a walker or cane.  He smokes about a pack of cigarettes a day.  Does not have any home oxygen use.  Lives with his son and his girlfriend.  Past Medical History:  Diagnosis Date   Acquired renal cyst of right kidney    Arthritis    Cancer ([place])    renal cell    Cirrhosis ([place])    Erectile dysfunction    Heart murmur    CHILDHOOD   Hematuria    left ureteral bleeding   History of cellulitis    2012 left wrist   History of kidney stones    Hyperlipidemia    Hypertension    Metastatic melanoma to lung, left ([place])    Nephrolithiasis    bilateral non-obstructive per ct 07-02-2015   Type 2 diabetes mellitus ([place])    Wears glasses     Past Surgical History:  Procedure Laterality Date   CARPAL TUNNEL RELEASE Right 1990  approx   COLONOSCOPY  03/04/2010   CYSTOSCOPY W/ RETROGRADES Left 10/28/2015   Procedure: CYSTOSCOPY WITH LEFT RETROGRADE PYELOGRAM;  Surgeon: Cleon Gustin, MD;  Location: [facility];  Service: Urology;  Laterality: Left;   CYSTOSCOPY W/  URETERAL STENT PLACEMENT Left 10/28/2015   Procedure: CYSTOSCOPY WITH LEFT URETERAL STENT EXCHANGE;  Surgeon: Cleon Gustin, MD;  Location: [facility];  Service: Urology;  Laterality: Left;   CYSTOSCOPY WITH RETROGRADE PYELOGRAM, URETEROSCOPY AND STENT PLACEMENT Bilateral 09/17/2015   Procedure: CYSTOSCOPY WITH BILATERAL RETROGRADE PYELOGRAM, LEFT URETEROSCOPY WITH URETERAL BIOPSY AND  STENT PLACEMENT;  Surgeon: Irine Seal, MD;  Location: [facility];  Service: Urology;  Laterality: Bilateral;   ESOPHAGEAL BANDING  12/30/2020   Procedure: ESOPHAGEAL BANDING;  Surgeon: Ladene Artist, MD;  Location: [facility] ENDOSCOPY;  Service: Endoscopy;;   ESOPHAGOGASTRODUODENOSCOPY (EGD) WITH PROPOFOL N/A 12/30/2020   Procedure: ESOPHAGOGASTRODUODENOSCOPY (EGD) WITH PROPOFOL;  Surgeon: Ladene Artist, MD;  Location: [facility] ENDOSCOPY;  Service: Endoscopy;  Laterality: N/A;   HOLMIUM LASER APPLICATION Left 84/13/2440   Procedure: HOLMIUM LASER APPLICATION;  Surgeon: Irine Seal, MD;  Location: [facility];  Service: Urology;  Laterality: Left;   KNEE ARTHROSCOPY Right 2008   MELANOMA EXCISION  2018   Left arm   right total knee replacement  Right    2009   ROBOT ASSITED LAPAROSCOPIC NEPHROURETERECTOMY Left 12/04/2015   Procedure: XI ROBOT ASSITED LAPAROSCOPIC NEPHROURETERECTOMY;  Surgeon: Cleon Gustin, MD;  Location: [facility];  Service: Urology;  Laterality: Left;   ROTATOR CUFF REPAIR Right    SHOULDER ARTHROSCOPY Right 12/17/2020   Procedure:  Right shoulder arthroscopy, debridement, subacromial decompression, distal clavicle resection;  Surgeon: Justice Britain, MD;  Location: [facility];  Service: Orthopedics;  Laterality: Right;  38min   STONE EXTRACTION WITH BASKET Left 09/17/2015   Procedure: STONE EXTRACTION WITH BASKET;  Surgeon: Irine Seal, MD;  Location: [facility];  Service: Urology;  Laterality: Left;   TOTAL KNEE ARTHROPLASTY Right 2009   URETEROSCOPY   10/28/2015   Procedure: DIAGNOSTIC LEFT URETEROSCOPY;  Surgeon: Cleon Gustin, MD;  Location: [facility];  Service: Urology;;   WISDOM TOOTH EXTRACTION      Family History  Problem Relation Age of Onset   Diabetes Mother    Heart disease Mother    Stroke Mother    CAD Mother    Diabetes Father    Congestive Heart Failure Father    Cancer Brother    Heart disease Son    COPD Son    Emphysema Son    Heart attack Brother     Social History:  reports that he has been smoking cigars and cigarettes. He has a 63.00 pack-year smoking history. He quit smokeless tobacco use about 28 years ago.  His smokeless tobacco use included chew. He reports that he does not drink alcohol and does not use drugs.  Allergies: No Known Allergies  Medications:  No current facility-administered medications on file prior to encounter.   Current Outpatient Medications on File Prior to Encounter  Medication Sig Dispense Refill   acetaminophen (TYLENOL) 325 MG tablet Take 2 tablets (650 mg total) by mouth every 6 (six) hours as needed for mild pain.     amLODipine (NORVASC) 5 MG tablet Take 1 tablet (5 mg total) by mouth daily. 30 tablet 0   aspirin EC 81 MG tablet Take 1 tablet (81 mg total) by mouth daily. 90 tablet 1   atorvastatin (LIPITOR) 10 MG tablet Take 1 tablet (10 mg total) by mouth daily. 30 tablet 0   Propylene Glycol 0.6 % SOLN Place 1 drop into both eyes 4 (four) times daily.     lactulose, encephalopathy, (CHRONULAC) 10 GM/15ML SOLN Take 15 mLs (10 g total) by mouth every other day. (Patient not taking: Reported on 05/27/2021) 236 mL 0   loratadine (CLARITIN) 10 MG tablet Take 1 tablet (10 mg total) by mouth daily. (Patient not taking: Reported on 05/27/2021)     melatonin 3 MG TABS tablet Take 2 tablets (6 mg total) by mouth at bedtime. (Patient not taking: Reported on 05/27/2021) 60 tablet 0   Multiple Vitamin (MULTIVITAMIN WITH MINERALS) TABS tablet Take 1 tablet by mouth daily. (Patient not  taking: Reported on 05/27/2021)     pantoprazole (PROTONIX) 40 MG tablet Take 1 tablet (40 mg total) by mouth 2 (two) times daily. (Patient not taking: Reported on 05/27/2021) 60 tablet 0   rifaximin (XIFAXAN) 550 MG TABS tablet Take 1 tablet (550 mg total) by mouth 2 (two) times daily. (Patient not taking: Reported on 05/27/2021) 60 tablet 0     ROS: Constitutional: No fever or chills Vision: No changes in vision ENT: No difficulty swallowing CV: No chest pain Pulm: No SOB or wheezing GI: No nausea or vomiting GU: No urgency or inability to hold urine Skin: No poor wound healing Neurologic: No numbness or tingling Psychiatric: No depression or anxiety Heme: No bruising Allergic: No reaction to medications or food   Exam: Blood pressure (!) 166/84, pulse 93, temperature 99 F (37.2 C), temperature source Oral, resp. rate 17, height 5'  7" (1.702 m), weight 68 kg, SpO2 93 %. General: No acute distress Orientation: Awake alert and oriented x3 Mood and Affect: Cooperative and pleasant Gait: Unable to assess due to his fracture Coordination and balance: Within normal limits  Left lower extremity: Reveals skin without lesions.  Leg is shortened and externally rotated.  No deformity through the lower leg or tibia or ankle.  Patient is able to actively dorsiflex and plantarflex his foot and ankle.  He has palpable PT pulse.  He has a warm well-perfused foot with intact sensation to light touch.  Right lower extremity: Skin without lesions. No tenderness to palpation. Full painless ROM, full strength in each muscle groups without evidence of instability.   Medical Decision Making: Data: Imaging: X-rays show a comminuted left intertrochanteric femur fracture.  Labs:  Results for orders placed or performed during the hospital encounter of 05/27/21 (from the past 24 hour(s))  CBC     Status: Abnormal   Collection Time: 05/27/21  8:53 PM  Result Value Ref Range   WBC 7.6 4.0 - 10.5 K/uL    RBC 4.10 (L) 4.22 - 5.81 MIL/uL   Hemoglobin 11.6 (L) 13.0 - 17.0 g/dL   HCT 36.7 (L) 39.0 - 52.0 %   MCV 89.5 80.0 - 100.0 fL   MCH 28.3 26.0 - 34.0 pg   MCHC 31.6 30.0 - 36.0 g/dL   RDW 16.8 (H) 11.5 - 15.5 %   Platelets 95 (L) 150 - 400 K/uL   nRBC 0.0 0.0 - 0.2 %  Comprehensive metabolic panel     Status: Abnormal   Collection Time: 05/27/21  8:53 PM  Result Value Ref Range   Sodium 137 135 - 145 mmol/L   Potassium 4.1 3.5 - 5.1 mmol/L   Chloride 107 98 - 111 mmol/L   CO2 22 22 - 32 mmol/L   Glucose, Bld 202 (H) 70 - 99 mg/dL   BUN 19 8 - 23 mg/dL   Creatinine, Ser 0.92 0.61 - 1.24 mg/dL   Calcium 9.2 8.9 - 10.3 mg/dL   Total Protein 7.3 6.5 - 8.1 g/dL   Albumin 4.1 3.5 - 5.0 g/dL   AST 22 15 - 41 U/L   ALT 26 0 - 44 U/L   Alkaline Phosphatase 116 38 - 126 U/L   Total Bilirubin 0.9 0.3 - 1.2 mg/dL   GFR, Estimated >60 >60 mL/min   Anion gap 8 5 - 15  Protime-INR     Status: None   Collection Time: 05/27/21  8:53 PM  Result Value Ref Range   Prothrombin Time 13.1 11.4 - 15.2 seconds   INR 1.0 0.8 - 1.2  CBG monitoring, ED     Status: Abnormal   Collection Time: 05/27/21  9:51 PM  Result Value Ref Range   Glucose-Capillary 166 (H) 70 - 99 mg/dL  CBC     Status: Abnormal   Collection Time: 05/28/21  3:41 AM  Result Value Ref Range   WBC 8.4 4.0 - 10.5 K/uL   RBC 3.85 (L) 4.22 - 5.81 MIL/uL   Hemoglobin 11.1 (L) 13.0 - 17.0 g/dL   HCT 34.6 (L) 39.0 - 52.0 %   MCV 89.9 80.0 - 100.0 fL   MCH 28.8 26.0 - 34.0 pg   MCHC 32.1 30.0 - 36.0 g/dL   RDW 16.5 (H) 11.5 - 15.5 %   Platelets 93 (L) 150 - 400 K/uL   nRBC 0.0 0.0 - 0.2 %  Basic metabolic panel  Status: Abnormal   Collection Time: 05/28/21  3:41 AM  Result Value Ref Range   Sodium 136 135 - 145 mmol/L   Potassium 4.1 3.5 - 5.1 mmol/L   Chloride 109 98 - 111 mmol/L   CO2 22 22 - 32 mmol/L   Glucose, Bld 233 (H) 70 - 99 mg/dL   BUN 20 8 - 23 mg/dL   Creatinine, Ser 0.93 0.61 - 1.24 mg/dL   Calcium 8.8 (L)  8.9 - 10.3 mg/dL   GFR, Estimated >60 >60 mL/min   Anion gap 5 5 - 15  CBG monitoring, ED     Status: Abnormal   Collection Time: 05/28/21  7:56 AM  Result Value Ref Range   Glucose-Capillary 208 (H) 70 - 99 mg/dL  Glucose, capillary     Status: Abnormal   Collection Time: 05/28/21 10:24 AM  Result Value Ref Range   Glucose-Capillary 179 (H) 70 - 99 mg/dL     Imaging or Labs ordered: None  Medical history and chart was reviewed and case discussed with medical provider.  Assessment/Plan: 76 year old male with multiple medical comorbidities with a left intertrochanteric femur fracture.  Due to the unstable nature of his injury I recommend proceeding with cephalomedullary nailing.  Risks and benefits were discussed with the patient.  Risks included but not limited to bleeding, infection, malunion, nonunion, hardware failure, hardware irritation, nerve or blood vessel injury, DVT, even the possibility anesthetic complications.  He agreed to proceed with surgery and consent was obtained.  Shona Needles, MD [facility] [phone] (office) [email or web address]

## 2021-05-28 NOTE — Progress Notes (Addendum)
PROGRESS NOTE    Dalton Miller  OBS:[id number] DOB: 08-29-1945 DOA: 05/27/2021 PCP: [facility]   Brief Narrative:    Dalton Miller  is a 76 y.o. male, with history of metastatic melanoma, lung CA, urothelial CA, GI bleed, CVA, with a prolonged hospitalization last December/January for acute CVA, GI bleed due to liver cirrhosis with varices, COVID-19 infection, patient recovered and he was discharged home from [place] on 01/29/2021.  He receives all of his care at [facility], he is following with oncologist over there, so far he tells me he is in remission, but I have no access to [place] records. Patient presented to ED secondary to mechanical fall and was noted to have a left hip fracture.  He was transferred from [facility] to [facility] for surgical management and has undergone IM nail to left intertrochanteric femur fracture on 5/26.  Assessment & Plan:   Principal Problem:   Hip fracture ([place]) Active Problems:   Diabetes mellitus ([place])   Hyperlipidemia associated with type 2 diabetes mellitus ([place])   Hypertension associated with diabetes ([place])   Vitamin D deficiency   Single kidney   COPD (chronic obstructive pulmonary disease) ([facility])   Malignant neoplasm of upper lobe of left lung ([facility])   Malignant melanoma ([facility])   CVA (cerebral vascular accident) ([place])  Assessment and Plan:   Left hip fracture status post IM nail 5/26 -Secondary to mechanical fall -Appreciate further orthopedics recommendations -Continue on heparin for DVT prophylaxis for 30 days with close follow-up of CBC for thrombocytopenia -Patient weightbearing as tolerated to left lower extremity.  PT evaluation ordered and pending for a.m.   Hepatic cirrhosis/esophageal varices -Foll[place]ow on CMP, INR,CBC -Continue with rifaximin   History of CVA -Continue statin hold aspirin for now given thrombocytopenia -Patient will be on Lovenox as noted above   Diabetes mellitus type 2 -Check A1c, will keep an insulin  sliding scale   Hypertension -Continue with home medications   History of lung cancer History of metastatic recurrent melanoma Urothelial CA -Is been followed by Dr. Darnell Level in [place] -He was counseled on cessation   Thrombocytopenia -Due to liver cirrhosis, no indication for transfusion -Hold antiplatelet agents -Monitor CBC    DVT prophylaxis: SCDs Code Status: Full Family Communication: Spoke with son Dalton Miller on phone 5/26 Disposition Plan:  Status is: Inpatient Remains inpatient appropriate because: Need for inpatient procedure and IV medications.  Consultants:  Orthopedics  Procedures:  IM nail to left hip 5/26  Antimicrobials:  Anti-infectives (From admission, onward)    Start     Dose/Rate Route Frequency Ordered Stop   05/28/21 1109  ceFAZolin (ANCEF) 2-4 GM/100ML-% IVPB       Note to Pharmacy: Cameron Sprang M: cabinet override      05/28/21 1109 05/28/21 2314   05/27/21 2345  rifaximin (XIFAXAN) tablet 550 mg        550 mg Oral 2 times daily 05/27/21 2338         Subjective: Patient seen and evaluated today after his surgery.  He started to complain of some more left-sided hip pain that is beginning to bother him.  Denies any other complaints.  Objective: Vitals:   05/28/21 1710 05/28/21 1715 05/28/21 1720 05/28/21 1737  BP:   117/71 121/72  Pulse: 98  (!) 101 (!) 105  Resp: 17 15 17 18   Temp:    98.2 F (36.8 C)  TempSrc:    Axillary  SpO2:  97%   94%  Weight:      Height:        Intake/Output Summary (Last 24 hours) at 05/28/2021 1744 Last data filed at 05/28/2021 1558 Gross per 24 hour  Intake 550 ml  Output 1125 ml  Net -575 ml   Filed Weights   05/27/21 1831  Weight: 68 kg    Examination:  General exam: Appears calm and comfortable  Respiratory system: Clear to auscultation. Respiratory effort normal. Cardiovascular system: S1 & S2 heard, RRR.  Gastrointestinal system: Abdomen is soft Central nervous  system: Alert and awake Extremities: No edema Skin: No significant lesions noted, left hip incision clean dry and intact Psychiatry: Flat affect.    Data Reviewed: I have personally reviewed following labs and imaging studies  CBC: Recent Labs  Lab 05/27/21 2053 05/28/21 0341  WBC 7.6 8.4  HGB 11.6* 11.1*  HCT 36.7* 34.6*  MCV 89.5 89.9  PLT 95* 93*   Basic Metabolic Panel: Recent Labs  Lab 05/27/21 2053 05/28/21 0341  NA 137 136  K 4.1 4.1  CL 107 109  CO2 22 22  GLUCOSE 202* 233*  BUN 19 20  CREATININE 0.92 0.93  CALCIUM 9.2 8.8*   GFR: Estimated Creatinine Clearance: 64.2 mL/min (by C-G formula based on SCr of 0.93 mg/dL). Liver Function Tests: Recent Labs  Lab 05/27/21 2053  AST 22  ALT 26  ALKPHOS 116  BILITOT 0.9  PROT 7.3  ALBUMIN 4.1   No results for input(s): LIPASE, AMYLASE in the last 168 hours. No results for input(s): AMMONIA in the last 168 hours. Coagulation Profile: Recent Labs  Lab 05/27/21 2053  INR 1.0   Cardiac Enzymes: No results for input(s): CKTOTAL, CKMB, CKMBINDEX, TROPONINI in the last 168 hours. BNP (last 3 results) No results for input(s): PROBNP in the last 8760 hours. HbA1C: No results for input(s): HGBA1C in the last 72 hours. CBG: Recent Labs  Lab 05/27/21 2151 05/28/21 0756 05/28/21 1024 05/28/21 1338  GLUCAP 166* 208* 179* 189*   Lipid Profile: No results for input(s): CHOL, HDL, LDLCALC, TRIG, CHOLHDL, LDLDIRECT in the last 72 hours. Thyroid Function Tests: No results for input(s): TSH, T4TOTAL, FREET4, T3FREE, THYROIDAB in the last 72 hours. Anemia Panel: No results for input(s): VITAMINB12, FOLATE, FERRITIN, TIBC, IRON, RETICCTPCT in the last 72 hours. Sepsis Labs: No results for input(s): PROCALCITON, LATICACIDVEN in the last 168 hours.  No results found for this or any previous visit (from the past 240 hour(s)).       Radiology Studies: DG Knee 1-2 Views Left  Result Date:  05/27/2021 CLINICAL DATA:  Fall, left leg pain EXAM: LEFT KNEE - 1-2 VIEW COMPARISON:  None Available. FINDINGS: No evidence of fracture, dislocation, or joint effusion. No evidence of arthropathy or other focal bone abnormality. Soft tissues are unremarkable. IMPRESSION: Negative. Electronically Signed   By: Rolm Baptise M.D.   On: 05/27/2021 20:52   DG C-Arm 1-60 Min-No Report  Result Date: 05/28/2021 Fluoroscopy was utilized by the requesting physician.  No radiographic interpretation.   DG HIP UNILAT W OR W/O PELVIS 2-3 VIEWS LEFT  Result Date: 05/28/2021 CLINICAL DATA:  Provided history: Postop left hip. EXAM: DG HIP (WITH OR WITHOUT PELVIS) 2-3V LEFT COMPARISON:  Intra procedural fluoroscopic images of the left hip 05/28/2021. Radiographs of the left hip 05/27/2021. FINDINGS: Immediate postoperative changes from left femoral ORIF for intertrochanteric left femur fracture. An intramedullary nail is present within the proximal left femur. Two interlocking screws proximally,  traversing the left femoral head/neck. An additional interlocking screw is present more distally. Overlying soft tissue swelling and subcutaneous emphysema. IMPRESSION: Immediate postoperative changes from left femoral ORIF for intertrochanteric left femur fracture. Electronically Signed   By: Kellie Simmering D.O.   On: 05/28/2021 14:03   DG HIP UNILAT WITH PELVIS 2-3 VIEWS LEFT  Result Date: 05/28/2021 CLINICAL DATA:  ORIF of proximal left femur fracture. EXAM: DG HIP (WITH OR WITHOUT PELVIS) 2-3V LEFT COMPARISON:  05/27/2021 FINDINGS: Status post ORIF of intertrochanteric fracture of the proximal left fever. Images demonstrate placement of IM nail and 2 cannulated screws along the right femoral neck. IMPRESSION: Status post ORIF of intertrochanteric fracture of the proximal left femur. Electronically Signed   By: Kerby Moors M.D.   On: 05/28/2021 13:32   DG Hip Unilat With Pelvis 2-3 Views Left  Result Date:  05/27/2021 CLINICAL DATA:  fall, left hip pain EXAM: DG HIP (WITH OR WITHOUT PELVIS) 2-3V LEFT COMPARISON:  CT 01/03/2021 FINDINGS: There is an acute, displaced left intertrochanteric femur fracture. Moderate osteoarthritis of the hips. Bilateral SI joint and lower lumbar spine degenerative changes. Vascular calcifications. IMPRESSION: Acute, displaced left intertrochanteric femur fracture. Electronically Signed   By: Maurine Simmering M.D.   On: 05/27/2021 19:08        Scheduled Meds:  amLODipine  5 mg Oral Daily   aspirin EC  81 mg Oral Daily   atorvastatin  10 mg Oral Daily   docusate sodium  100 mg Oral BID   fentaNYL       heparin  5,000 Units Subcutaneous Q8H   insulin aspart  0-5 Units Subcutaneous QHS   insulin aspart  0-9 Units Subcutaneous TID WC   lactulose  10 g Oral QODAY   melatonin  6 mg Oral QHS   multivitamin with minerals  1 tablet Oral Daily   pantoprazole  40 mg Oral BID   polyvinyl alcohol  1 drop Both Eyes QID   rifaximin  550 mg Oral BID   Continuous Infusions:  acetaminophen     ceFAZolin     methocarbamol (ROBAXIN) IV       LOS: 1 day    Time spent: 35 minutes    Mala Gibbard Darleen Crocker, DO [facility]  If 7PM-7AM, please contact night-coverage [email or web address] 05/28/2021, 5:44 PM

## 2021-05-28 NOTE — Anesthesia Procedure Notes (Signed)
Procedure Name: LMA Insertion Date/Time: 05/28/2021 12:13 PM Performed by: Trinna Post., CRNA Pre-anesthesia Checklist: Patient identified, Emergency Drugs available, Suction available, Patient being monitored and Timeout performed Patient Re-evaluated:Patient Re-evaluated prior to induction Oxygen Delivery Method: Circle system utilized Preoxygenation: Pre-oxygenation with 100% oxygen Induction Type: IV induction Ventilation: Mask ventilation without difficulty LMA: LMA inserted LMA Size: 4.0 Number of attempts: 1 Placement Confirmation: positive ETCO2 and breath sounds checked- equal and bilateral Tube secured with: Tape Dental Injury: Teeth and Oropharynx as per pre-operative assessment

## 2021-05-29 DIAGNOSIS — E559 Vitamin D deficiency, unspecified: Secondary | ICD-10-CM

## 2021-05-29 DIAGNOSIS — S72002A Fracture of unspecified part of neck of left femur, initial encounter for closed fracture: Secondary | ICD-10-CM | POA: Diagnosis not present

## 2021-05-29 DIAGNOSIS — E1165 Type 2 diabetes mellitus with hyperglycemia: Secondary | ICD-10-CM | POA: Diagnosis not present

## 2021-05-29 DIAGNOSIS — E1169 Type 2 diabetes mellitus with other specified complication: Secondary | ICD-10-CM | POA: Diagnosis not present

## 2021-05-29 DIAGNOSIS — C3412 Malignant neoplasm of upper lobe, left bronchus or lung: Secondary | ICD-10-CM | POA: Diagnosis not present

## 2021-05-29 LAB — CBC
HCT: 27.2 % — ABNORMAL LOW (ref 39.0–52.0)
Hemoglobin: 8.8 g/dL — ABNORMAL LOW (ref 13.0–17.0)
MCH: 29 pg (ref 26.0–34.0)
MCHC: 32.4 g/dL (ref 30.0–36.0)
MCV: 89.8 fL (ref 80.0–100.0)
Platelets: UNDETERMINED 10*3/uL (ref 150–400)
RBC: 3.03 MIL/uL — ABNORMAL LOW (ref 4.22–5.81)
RDW: 16.5 % — ABNORMAL HIGH (ref 11.5–15.5)
WBC: 9.5 10*3/uL (ref 4.0–10.5)
nRBC: 0 % (ref 0.0–0.2)

## 2021-05-29 LAB — BASIC METABOLIC PANEL
Anion gap: 9 (ref 5–15)
BUN: 24 mg/dL — ABNORMAL HIGH (ref 8–23)
CO2: 20 mmol/L — ABNORMAL LOW (ref 22–32)
Calcium: 8.4 mg/dL — ABNORMAL LOW (ref 8.9–10.3)
Chloride: 103 mmol/L (ref 98–111)
Creatinine, Ser: 1.03 mg/dL (ref 0.61–1.24)
GFR, Estimated: >60 mL/min (ref >60)
Glucose, Bld: 185 mg/dL — ABNORMAL HIGH (ref 70–99)
Potassium: 4.3 mmol/L (ref 3.5–5.1)
Sodium: 132 mmol/L — ABNORMAL LOW (ref 135–145)

## 2021-05-29 LAB — GLUCOSE, CAPILLARY
Glucose-Capillary: 172 mg/dL — ABNORMAL HIGH (ref 70–99)
Glucose-Capillary: 209 mg/dL — ABNORMAL HIGH (ref 70–99)
Glucose-Capillary: 167 mg/dL — ABNORMAL HIGH (ref 70–99)
Glucose-Capillary: 242 mg/dL — ABNORMAL HIGH (ref 70–99)

## 2021-05-29 LAB — MAGNESIUM: Magnesium: 1.7 mg/dL (ref 1.7–2.4)

## 2021-05-29 NOTE — Evaluation (Signed)
Physical Therapy Evaluation Patient Details Name: Dalton Miller MRN: [id number] DOB: 05-06-1945 Today's Date: 05/29/2021  History of Present Illness  Pt adm 5/25 with lt intertrochanteric femur fx after a fall. Underwent ORIF on 5/26. PMH - rt CVA, OA, shoulder surgery, renal cell cancer s/p nephrectomy, melanoma with lung mets and urothelial cancer,  HTN, DMII, COPD, R TKA,  Clinical Impression  Pt admitted with above diagnosis and presents to PT with functional limitations due to deficits listed below (See PT problem list). Pt needs skilled PT to maximize independence and safety to allow discharge to acute inpatient rehab so pt can reach a level that will allow family to care for him at home. .         Recommendations for follow up therapy are one component of a multi-disciplinary discharge planning process, led by the attending physician.  Recommendations may be updated based on patient status, additional functional criteria and insurance authorization.  Follow Up Recommendations Acute inpatient rehab (3hours/day)    Assistance Recommended at Discharge Intermittent Supervision/Assistance  Patient can return home with the following  A lot of help with walking and/or transfers;A lot of help with bathing/dressing/bathroom;Assistance with cooking/housework;Assist for transportation;Help with stairs or ramp for entrance    Equipment Recommendations None recommended by PT  Recommendations for Other Services  Rehab consult    Functional Status Assessment Patient has had a recent decline in their functional status and demonstrates the ability to make significant improvements in function in a reasonable and predictable amount of time.     Precautions / Restrictions Precautions Precautions: Fall Restrictions Weight Bearing Restrictions: Yes LLE Weight Bearing: Weight bearing as tolerated      Mobility  Bed Mobility Overal bed mobility: Needs Assistance Bed Mobility: Supine to Sit      Supine to sit: +2 for physical assistance, Mod assist     General bed mobility comments: Assist to move LLE off of bed, elevate trunk into sitting and bring hips to EOB    Transfers Overall transfer level: Needs assistance Equipment used: Rolling walker (2 wheels), Ambulation equipment used Transfers: Sit to/from Stand, Bed to chair/wheelchair/BSC Sit to Stand: +2 physical assistance, Min assist           General transfer comment: Stood from bed with Stedy with +2 min assist to bring hips up. Bed to chair with Stedy. Stood from chair with walker with +2 min assist with assist to bring hips up and for balance. Verbal cues for hand placement. Transfer via Lift Equipment: Stedy  Ambulation/Gait Ambulation/Gait assistance: +2 physical assistance, Herbalist (Feet): 1 Feet Assistive device: Rolling walker (2 wheels) Gait Pattern/deviations: Step-to pattern, Decreased step length - right, Decreased step length - left, Decreased stance time - left, Decreased weight shift to left, Antalgic Gait velocity: decr Gait velocity interpretation: <1.31 ft/sec, indicative of household ambulator   General Gait Details: Pt with 4 very small shuffling steps forward. Pt with limited tolerance of weight on LLE. Assist for balance and support.  Stairs            Wheelchair Mobility    Modified Rankin (Stroke Patients Only)       Balance Overall balance assessment: Needs assistance Sitting-balance support: No upper extremity supported, Feet supported Sitting balance-Leahy Scale: Good     Standing balance support: Bilateral upper extremity supported, During functional activity, Reliant on assistive device for balance Standing balance-Leahy Scale: Poor Standing balance comment: walker and min assist for static standing  Pertinent Vitals/Pain Pain Assessment Pain Assessment: 0-10 Pain Score: 6  Pain Location: lt hip Pain  Descriptors / Indicators: Sharp, Dull Pain Intervention(s): Limited activity within patient's tolerance, Premedicated before session, Monitored during session, Repositioned, Ice applied    Home Living Family/patient expects to be discharged to:: Private residence Living Arrangements: Children Available Help at Discharge: Family;Available 24 hours/day Type of Home: Mobile home Home Access: Level entry       Home Layout: One level Home Equipment: Cane - single Associate Professor (2 wheels);Rollator (4 wheels)      Prior Function Prior Level of Function : Independent/Modified Independent             Mobility Comments: Using rolling walker. At times no assitive device       Hand Dominance   Dominant Hand: Right    Extremity/Trunk Assessment   Upper Extremity Assessment Upper Extremity Assessment: Defer to OT evaluation    Lower Extremity Assessment Lower Extremity Assessment: LLE deficits/detail LLE Deficits / Details: Limited by pain and residual weakness from recent CVA.       Communication   Communication: No difficulties  Cognition Arousal/Alertness: Awake/alert Behavior During Therapy: WFL for tasks assessed/performed Overall Cognitive Status: Within Functional Limits for tasks assessed                                          General Comments General comments (skin integrity, edema, etc.): VSS on RA. O2 removed for mobility and replaced prior to departing from room.    Exercises     Assessment/Plan    PT Assessment Patient needs continued PT services  PT Problem List Decreased strength;Decreased activity tolerance;Decreased balance;Decreased mobility;Decreased knowledge of use of DME;Pain       PT Treatment Interventions DME instruction;Gait training;Functional mobility training;Therapeutic activities;Therapeutic exercise;Balance training;Patient/family education    PT Goals (Current goals can be found in the Care Plan  section)  Acute Rehab PT Goals Patient Stated Goal: return home PT Goal Formulation: With patient Time For Goal Achievement: 06/12/21 Potential to Achieve Goals: Good    Frequency Min 3X/week     Co-evaluation               AM-PAC PT "6 Clicks" Mobility  Outcome Measure Help needed turning from your back to your side while in a flat bed without using bedrails?: A Lot Help needed moving from lying on your back to sitting on the side of a flat bed without using bedrails?: Total Help needed moving to and from a bed to a chair (including a wheelchair)?: Total Help needed standing up from a chair using your arms (e.g., wheelchair or bedside chair)?: Total Help needed to walk in hospital room?: Total Help needed climbing 3-5 steps with a railing? : Total 6 Click Score: 7    End of Session Equipment Utilized During Treatment: Gait belt;Oxygen Activity Tolerance: Patient tolerated treatment well Patient left: in chair;with call bell/phone within reach;with chair alarm set Nurse Communication: Mobility status PT Visit Diagnosis: Other abnormalities of gait and mobility (R26.89);History of falling (Z91.81);Pain Pain - Right/Left: Left Pain - part of body: Hip    Time: 1062-6948 PT Time Calculation (min) (ACUTE ONLY): 20 min   Charges:   PT Evaluation $PT Eval Moderate Complexity: [place] [facility] 05/29/2021,  11:34 AM

## 2021-05-29 NOTE — Progress Notes (Signed)
PROGRESS NOTE    Dalton Miller  [name]:[id number] DOB: 1945-03-20 DOA: 05/27/2021 PCP: [facility]   Brief Narrative:  Dalton Miller  is a 76 y.o. male, with history of metastatic melanoma, lung CA, urothelial CA, GI bleed, CVA, with a prolonged hospitalization last December/January for acute CVA, GI bleed due to liver cirrhosis with varices, COVID-19 infection, patient recovered and he was discharged home from [place] on 01/29/2021.  He receives all of his care at [facility], he is following with oncologist over there, so far he tells me he is in remission, but I have no access to [place] records. Patient presented to ED secondary to mechanical fall and was noted to have a left hip fracture.  He was transferred from [facility] to [facility] for surgical management and has undergone IM nail to left intertrochanteric femur fracture on 5/26.  Assessment & Plan:   Left hip fracture status post IM nail 5/26 -Secondary to mechanical fall -Appreciate further orthopedics recommendations -New as needed pain medications and monitor H&H closely. -Patient weightbearing as tolerated to left lower extremity.   -Evaluated by PT recommended CIR -Ortho recommended DOAC upon discharge for 30 days.  Acute blood loss anemia: -H&H dropped from 11.1-8.8.  Continue to monitor closely.   Hepatic cirrhosis/esophageal varices -Normal liver enzymes and INR. -Continue with rifaximin and lactulose   History of CVA -Continue statin hold aspirin for now given thrombocytopenia   Diabetes mellitus type 2 -Well-controlled.  A1c 6.4%.  Continue sliding scale insulin and monitor blood sugar closely  Hypertension -Continue amlodipine   History of lung cancer History of metastatic recurrent melanoma Urothelial CA -Is been followed by Dr. Darnell Level in [place] -He was counseled on cessation   Thrombocytopenia -Due to liver cirrhosis, no indication for transfusion -Continue to monitor  DVT  prophylaxis: SCD Code Status: Full code Family Communication:  None present at bedside.  Plan of care discussed with patient in length and he verbalized understanding and agreed with it. Disposition Plan: CIR   Consultants:  Orthopedic surgery  Procedures:  Intramedullary nail intertrochanteric left  Antimicrobials:  Cefazolin  Status is: Inpatient    Subjective: Patient seen and examined.  Sitting comfortably on the bed.  Denies any complaints.  Has some cough however denies shortness of breath or wheezing.  No fever.  No acute events overnight.  Objective: Vitals:   05/28/21 1720 05/28/21 1737 05/28/21 2144 05/29/21 0644  BP: 117/71 121/72 108/76 109/68  Pulse: (!) 101 (!) 105 94 93  Resp: 17 18 19 20   Temp:  98.2 F (36.8 C) 98.2 F (36.8 C) 98.3 F (36.8 C)  TempSrc:  Axillary Oral Oral  SpO2:  94% 98% 97%  Weight:  68 kg    Height:  5\' 7"  (1.702 m)      Intake/Output Summary (Last 24 hours) at 05/29/2021 1201 Last data filed at 05/29/2021 0411 Gross per 24 hour  Intake 790 ml  Output 1325 ml  Net -535 ml   Filed Weights   05/27/21 1831 05/28/21 1737  Weight: 68 kg 68 kg    Examination:  General exam: Appears calm and comfortable, on nasal cannula, communicating well Respiratory system: Clear to auscultation. Respiratory effort normal. Cardiovascular system: S1 & S2 heard, RRR. No JVD, murmurs, rubs, gallops or clicks. No pedal edema. Gastrointestinal system: Abdomen is nondistended, soft and nontender. No organomegaly or masses felt. Normal bowel sounds heard. Central nervous system: Alert and oriented. No focal neurological  deficits. Left hip: Dressing dry and intact.  No signs of active bleeding or discharge seen. Skin: No rashes, lesions or ulcers Psychiatry: Judgement and insight appear normal. Mood & affect appropriate.    Data Reviewed: I have personally reviewed following labs and imaging studies  CBC: Recent Labs  Lab 05/27/21 2053  05/28/21 0341 05/29/21 0215  WBC 7.6 8.4 9.5  HGB 11.6* 11.1* 8.8*  HCT 36.7* 34.6* 27.2*  MCV 89.5 89.9 89.8  PLT 95* 93* PLATELET CLUMPS NOTED ON SMEAR, UNABLE TO ESTIMATE   Basic Metabolic Panel: Recent Labs  Lab 05/27/21 2053 05/28/21 0341 05/29/21 0215  NA 137 136 132*  K 4.1 4.1 4.3  CL 107 109 103  CO2 22 22 20*  GLUCOSE 202* 233* 185*  BUN 19 20 24*  CREATININE 0.92 0.93 1.03  CALCIUM 9.2 8.8* 8.4*  MG  --   --  1.7   GFR: Estimated Creatinine Clearance: 57.9 mL/min (by C-G formula based on SCr of 1.03 mg/dL). Liver Function Tests: Recent Labs  Lab 05/27/21 2053  AST 22  ALT 26  ALKPHOS 116  BILITOT 0.9  PROT 7.3  ALBUMIN 4.1   No results for input(s): LIPASE, AMYLASE in the last 168 hours. No results for input(s): AMMONIA in the last 168 hours. Coagulation Profile: Recent Labs  Lab 05/27/21 2053  INR 1.0   Cardiac Enzymes: No results for input(s): CKTOTAL, CKMB, CKMBINDEX, TROPONINI in the last 168 hours. BNP (last 3 results) No results for input(s): PROBNP in the last 8760 hours. HbA1C: Recent Labs    05/28/21 1828  HGBA1C 6.4*   CBG: Recent Labs  Lab 05/28/21 1338 05/28/21 1743 05/28/21 2154 05/29/21 0731 05/29/21 1108  GLUCAP 189* 189* 243* 172* 209*   Lipid Profile: No results for input(s): CHOL, HDL, LDLCALC, TRIG, CHOLHDL, LDLDIRECT in the last 72 hours. Thyroid Function Tests: No results for input(s): TSH, T4TOTAL, FREET4, T3FREE, THYROIDAB in the last 72 hours. Anemia Panel: No results for input(s): VITAMINB12, FOLATE, FERRITIN, TIBC, IRON, RETICCTPCT in the last 72 hours. Sepsis Labs: No results for input(s): PROCALCITON, LATICACIDVEN in the last 168 hours.  No results found for this or any previous visit (from the past 240 hour(s)).    Radiology Studies: DG Knee 1-2 Views Left  Result Date: 05/27/2021 CLINICAL DATA:  Fall, left leg pain EXAM: LEFT KNEE - 1-2 VIEW COMPARISON:  None Available. FINDINGS: No evidence of  fracture, dislocation, or joint effusion. No evidence of arthropathy or other focal bone abnormality. Soft tissues are unremarkable. IMPRESSION: Negative. Electronically Signed   By: Rolm Baptise M.D.   On: 05/27/2021 20:52   DG C-Arm 1-60 Min-No Report  Result Date: 05/28/2021 Fluoroscopy was utilized by the requesting physician.  No radiographic interpretation.   DG HIP UNILAT W OR W/O PELVIS 2-3 VIEWS LEFT  Result Date: 05/28/2021 CLINICAL DATA:  Provided history: Postop left hip. EXAM: DG HIP (WITH OR WITHOUT PELVIS) 2-3V LEFT COMPARISON:  Intra procedural fluoroscopic images of the left hip 05/28/2021. Radiographs of the left hip 05/27/2021. FINDINGS: Immediate postoperative changes from left femoral ORIF for intertrochanteric left femur fracture. An intramedullary nail is present within the proximal left femur. Two interlocking screws proximally, traversing the left femoral head/neck. An additional interlocking screw is present more distally. Overlying soft tissue swelling and subcutaneous emphysema. IMPRESSION: Immediate postoperative changes from left femoral ORIF for intertrochanteric left femur fracture. Electronically Signed   By: Kellie Simmering D.O.   On: 05/28/2021 14:03   DG HIP  UNILAT WITH PELVIS 2-3 VIEWS LEFT  Result Date: 05/28/2021 CLINICAL DATA:  ORIF of proximal left femur fracture. EXAM: DG HIP (WITH OR WITHOUT PELVIS) 2-3V LEFT COMPARISON:  05/27/2021 FINDINGS: Status post ORIF of intertrochanteric fracture of the proximal left fever. Images demonstrate placement of IM nail and 2 cannulated screws along the right femoral neck. IMPRESSION: Status post ORIF of intertrochanteric fracture of the proximal left femur. Electronically Signed   By: Kerby Moors M.D.   On: 05/28/2021 13:32   DG Hip Unilat With Pelvis 2-3 Views Left  Result Date: 05/27/2021 CLINICAL DATA:  fall, left hip pain EXAM: DG HIP (WITH OR WITHOUT PELVIS) 2-3V LEFT COMPARISON:  CT 01/03/2021 FINDINGS: There is an  acute, displaced left intertrochanteric femur fracture. Moderate osteoarthritis of the hips. Bilateral SI joint and lower lumbar spine degenerative changes. Vascular calcifications. IMPRESSION: Acute, displaced left intertrochanteric femur fracture. Electronically Signed   By: Maurine Simmering M.D.   On: 05/27/2021 19:08    Scheduled Meds:  amLODipine  5 mg Oral Daily   atorvastatin  10 mg Oral Daily   Chlorhexidine Gluconate Cloth  6 each Topical Daily   docusate sodium  100 mg Oral BID   insulin aspart  0-5 Units Subcutaneous QHS   insulin aspart  0-9 Units Subcutaneous TID WC   lactulose  10 g Oral QODAY   melatonin  6 mg Oral QHS   multivitamin with minerals  1 tablet Oral Daily   pantoprazole  40 mg Oral BID   polyvinyl alcohol  1 drop Both Eyes QID   rifaximin  550 mg Oral BID   Continuous Infusions:   ceFAZolin (ANCEF) IV 2 g (05/29/21 0411)   methocarbamol (ROBAXIN) IV       LOS: 2 days   Time spent: 35 minutes   Brennah Quraishi Loann Quill, MD [facility]  If 7PM-7AM, please contact night-coverage [email or web address] 05/29/2021, 12:01 PM

## 2021-05-29 NOTE — Progress Notes (Signed)
Subjective: 1 Day Post-Op Procedure(s) (LRB): INTRAMEDULLARY (IM) NAIL INTERTROCHANTRIC (Left) Patient reports pain as mild.  Pt sitting up in chair comfortably.  Objective: Vital signs in last 24 hours: Temp:  [97.6 F (36.4 C)-98.3 F (36.8 C)] 98.3 F (36.8 C) (05/27 0644) Pulse Rate:  [47-105] 93 (05/27 0644) Resp:  [13-20] 20 (05/27 0644) BP: (108-160)/(64-84) 109/68 (05/27 0644) SpO2:  [90 %-99 %] 97 % (05/27 0644) Weight:  [68 kg] 68 kg (05/26 1737)  Intake/Output from previous day: 05/26 0701 - 05/27 0700 In: 790 [P.O.:240; I.V.:500; IV Piggyback:50] Out: 1325 [Urine:1250; Blood:75] Intake/Output this shift: No intake/output data recorded.  Recent Labs    05/27/21 2053 05/28/21 0341 05/29/21 0215  HGB 11.6* 11.1* 8.8*   Recent Labs    05/28/21 0341 05/29/21 0215  WBC 8.4 9.5  RBC 3.85* 3.03*  HCT 34.6* 27.2*  PLT 93* PLATELET CLUMPS NOTED ON SMEAR, UNABLE TO ESTIMATE   Recent Labs    05/28/21 0341 05/29/21 0215  NA 136 132*  K 4.1 4.3  CL 109 103  CO2 22 20*  BUN 20 24*  CREATININE 0.93 1.03  GLUCOSE 233* 185*  CALCIUM 8.8* 8.4*   Recent Labs    05/27/21 2053  INR 1.0    Neurovascular intact Sensation intact distally Intact pulses distally Dorsiflexion/Plantar flexion intact   Assessment/Plan: 1 Day Post-Op Procedure(s) (LRB): INTRAMEDULLARY (IM) NAIL INTERTROCHANTRIC (Left) Up with therapy WBAT LLE Pain control as ordered Will recommend DOAC upon discharge for 30 days once Hgb stable and ok with med team Dispo per primary team but stable from ortho standpoint   Chriss Czar 05/29/2021, 11:48 AM

## 2021-05-29 NOTE — Plan of Care (Signed)

## 2021-05-29 NOTE — Evaluation (Signed)
Occupational Therapy Evaluation Patient Details Name: Dalton Miller MRN: [id number] DOB: [birth year] Today's Date: 05/29/2021   History of Present Illness Pt adm 5/25 with lt intertrochanteric femur fx after a fall. Underwent ORIF on 5/26. PMH - rt CVA, OA, shoulder surgery, renal cell cancer s/p nephrectomy, melanoma with lung mets and urothelial cancer,  HTN, DMII, COPD, R TKA,   Clinical Impression   Pt presents with decline in function and safety with ADLs and ADL mobility with impaired strength, balance and endurance. PTA pt lived at home with his son and daughter in law and was Ind with ADLs/selfcare, used a cane or RW for mobility. Pt currently requires max A with LB ADLs, toileting, set up/sup with UB ADLs, mod A sit - stand and was un able to SPT pr take steps to [facility] due to pain/weakness. Pt would benefit from acute OT services to address impairments to maximize level of function and safety     Recommendations for follow up therapy are one component of a multi-disciplinary discharge planning process, led by the attending physician.  Recommendations may be updated based on patient status, additional functional criteria and insurance authorization.   Follow Up Recommendations  Acute inpatient rehab (3hours/day)    Assistance Recommended at Discharge Intermittent Supervision/Assistance  Patient can return home with the following A lot of help with bathing/dressing/bathroom;A lot of help with walking and/or transfers;Help with stairs or ramp for entrance;Assist for transportation    Functional Status Assessment  Patient has had a recent decline in their functional status and demonstrates the ability to make significant improvements in function in a reasonable and predictable amount of time.  Equipment Recommendations  None recommended by OT    Recommendations for Other Services       Precautions / Restrictions Precautions Precautions: Fall Restrictions Weight Bearing Restrictions:  Yes LLE Weight Bearing: Weight bearing as tolerated      Mobility Bed Mobility               General bed mobility comments: pt seated in recliner upon OT arrival    Transfers Overall transfer level: Needs assistance Equipment used: Rolling walker (2 wheels), Ambulation equipment used Transfers: Sit to/from Stand, Bed to chair/wheelchair/BSC Sit to Stand: Mod assist           General transfer comment: stood from recliner with RW, but unable to take steps toward [facility]      Balance Overall balance assessment: Needs assistance Sitting-balance support: No upper extremity supported, Feet supported Sitting balance-Leahy Scale: Good     Standing balance support: Bilateral upper extremity supported, During functional activity, Reliant on assistive device for balance Standing balance-Leahy Scale: Poor                             ADL either performed or assessed with clinical judgement   ADL Overall ADL's : Needs assistance/impaired Eating/Feeding: Independent   Grooming: Wash/dry hands;Wash/dry face;Set up;Supervision/safety;Sitting   Upper Body Bathing: Set up;Supervision/ safety;Sitting   Lower Body Bathing: Maximal assistance   Upper Body Dressing : Set up;Supervision/safety;Sitting   Lower Body Dressing: Maximal assistance   Toilet Transfer: Moderate assistance;Cueing for safety Toilet Transfer Details (indicate cue type and reason): sit - stand from recliner attemptting to transfer to [facility], however pt unable to take steps but abe to SP Toileting- Clothing Manipulation and Hygiene: Maximal assistance       Functional mobility during ADLs: Moderate assistance;Cueing for safety;Rolling walker (2 wheels)  Vision Baseline Vision/History: 1 Wears glasses Ability to See in Adequate Light: 0 Adequate Patient Visual Report: No change from baseline       Perception     Praxis      Pertinent Vitals/Pain Pain Assessment Pain Assessment:  Faces Faces Pain Scale: Hurts even more Pain Location: L hip Pain Descriptors / Indicators: Aching, Sore, Throbbing Pain Intervention(s): Limited activity within patient's tolerance, Monitored during session, Premedicated before session, Repositioned     Hand Dominance Right   Extremity/Trunk Assessment Upper Extremity Assessment Upper Extremity Assessment: Generalized weakness;LUE deficits/detail LUE Deficits / Details: hx of L side weakness from previous CVA   Lower Extremity Assessment Lower Extremity Assessment: Defer to PT evaluation       Communication Communication Communication: No difficulties   Cognition Arousal/Alertness: Awake/alert Behavior During Therapy: WFL for tasks assessed/performed Overall Cognitive Status: Within Functional Limits for tasks assessed                                       General Comments       Exercises     Shoulder Instructions      Home Living Family/patient expects to be discharged to:: Private residence Living Arrangements: Children Available Help at Discharge: Family;Available 24 hours/day Type of Home: Mobile home Home Access: Level entry     Home Layout: One level     Bathroom Shower/Tub: Teacher, early years/pre: Standard Bathroom Accessibility: Yes   Home Equipment: Cane - single Associate Professor (2 wheels);Rollator (4 wheels);Shower seat          Prior Functioning/Environment Prior Level of Function : Independent/Modified Independent             Mobility Comments: Using rolling walker. At times no assitive device          OT Problem List: Decreased strength;Impaired balance (sitting and/or standing);Pain;Decreased activity tolerance;Decreased coordination;Decreased knowledge of use of DME or AE      OT Treatment/Interventions: Self-care/ADL training;DME and/or AE instruction;Therapeutic activities;Balance training;Patient/family education;Energy conservation     OT Goals(Current goals can be found in the care plan section) Acute Rehab OT Goals Patient Stated Goal: go home OT Goal Formulation: With patient Time For Goal Achievement: 06/12/21 Potential to Achieve Goals: Good ADL Goals Pt Will Perform Grooming: with min assist;standing Pt Will Perform Lower Body Bathing: with mod assist;with min assist;sitting/lateral leans;sit to/from stand Pt Will Perform Lower Body Dressing: with mod assist;with min assist;sitting/lateral leans;sit to/from stand Pt Will Transfer to Toilet: with mod assist;with min assist;stand pivot transfer;ambulating;bedside commode Pt Will Perform Toileting - Clothing Manipulation and hygiene: with mod assist;sit to/from stand  OT Frequency: Min 2X/week    Co-evaluation              AM-PAC OT "6 Clicks" Daily Activity     Outcome Measure Help from another person eating meals?: None Help from another person taking care of personal grooming?: A Little Help from another person toileting, which includes using toliet, bedpan, or urinal?: A Lot Help from another person bathing (including washing, rinsing, drying)?: A Lot Help from another person to put on and taking off regular upper body clothing?: A Little Help from another person to put on and taking off regular lower body clothing?: A Lot 6 Click Score: 16   End of Session Equipment Utilized During Treatment: Gait belt;Rolling walker (2 wheels)  Activity Tolerance: Patient limited by fatigue;Patient limited  by pain Patient left: in chair;with call bell/phone within reach;with bed alarm set  OT Visit Diagnosis: Unsteadiness on feet (R26.81);Other abnormalities of gait and mobility (R26.89);Muscle weakness (generalized) (M62.81);History of falling (Z91.81);Pain Pain - Right/Left: Left Pain - part of body: Hip                Time: 1106-1130 OT Time Calculation (min): 24 min Charges:  OT General Charges $OT Visit: 1 Visit OT Evaluation $OT Eval Moderate Complexity:  1 Mod OT Treatments $Therapeutic Activity: 8-22 mins    Britt Bottom 05/29/2021, 1:09 PM

## 2021-05-30 DIAGNOSIS — J449 Chronic obstructive pulmonary disease, unspecified: Secondary | ICD-10-CM

## 2021-05-30 DIAGNOSIS — E1165 Type 2 diabetes mellitus with hyperglycemia: Secondary | ICD-10-CM | POA: Diagnosis not present

## 2021-05-30 DIAGNOSIS — E1159 Type 2 diabetes mellitus with other circulatory complications: Secondary | ICD-10-CM | POA: Diagnosis not present

## 2021-05-30 DIAGNOSIS — S72002A Fracture of unspecified part of neck of left femur, initial encounter for closed fracture: Secondary | ICD-10-CM | POA: Diagnosis not present

## 2021-05-30 LAB — COMPREHENSIVE METABOLIC PANEL
ALT: 11 U/L (ref 0–44)
AST: 15 U/L (ref 15–41)
Albumin: 2.7 g/dL — ABNORMAL LOW (ref 3.5–5.0)
Alkaline Phosphatase: 83 U/L (ref 38–126)
Anion gap: 8 (ref 5–15)
BUN: 31 mg/dL — ABNORMAL HIGH (ref 8–23)
CO2: 22 mmol/L (ref 22–32)
Calcium: 8.3 mg/dL — ABNORMAL LOW (ref 8.9–10.3)
Chloride: 104 mmol/L (ref 98–111)
Creatinine, Ser: 1.18 mg/dL (ref 0.61–1.24)
GFR, Estimated: >60 mL/min (ref >60)
Glucose, Bld: 201 mg/dL — ABNORMAL HIGH (ref 70–99)
Potassium: 4.3 mmol/L (ref 3.5–5.1)
Sodium: 134 mmol/L — ABNORMAL LOW (ref 135–145)
Total Bilirubin: 0.5 mg/dL (ref 0.3–1.2)
Total Protein: 5.1 g/dL — ABNORMAL LOW (ref 6.5–8.1)

## 2021-05-30 LAB — CBC
HCT: 21 % — ABNORMAL LOW (ref 39.0–52.0)
Hemoglobin: 6.8 g/dL — CL (ref 13.0–17.0)
MCH: 28.7 pg (ref 26.0–34.0)
MCHC: 32.4 g/dL (ref 30.0–36.0)
MCV: 88.6 fL (ref 80.0–100.0)
Platelets: 72 10*3/uL — ABNORMAL LOW (ref 150–400)
RBC: 2.37 MIL/uL — ABNORMAL LOW (ref 4.22–5.81)
RDW: 16.2 % — ABNORMAL HIGH (ref 11.5–15.5)
WBC: 6.4 10*3/uL (ref 4.0–10.5)
nRBC: 0 % (ref 0.0–0.2)

## 2021-05-30 LAB — GLUCOSE, CAPILLARY
Glucose-Capillary: 197 mg/dL — ABNORMAL HIGH (ref 70–99)
Glucose-Capillary: 165 mg/dL — ABNORMAL HIGH (ref 70–99)
Glucose-Capillary: 298 mg/dL — ABNORMAL HIGH (ref 70–99)

## 2021-05-30 LAB — PREPARE RBC (CROSSMATCH)

## 2021-05-30 MED ORDER — SODIUM CHLORIDE 0.9% IV SOLUTION: Freq: Once | INTRAVENOUS | Status: AC

## 2021-05-30 NOTE — Progress Notes (Signed)
Subjective: Patient reports pain as moderate. Feels tired but can't sleep. Tolerating diet.  Urinating.   No CP, SOB.  Tried to mobilize some OOB with PT/OT but limited by weakness and pain.   Objective:   VITALS:   Vitals:   05/29/21 0644 05/29/21 1418 05/29/21 2041 05/30/21 0438  BP: 109/68 121/67 (!) 106/57 (!) 121/57  Pulse: 93 94 98   Resp: 20 20 16    Temp: 98.3 F (36.8 C) 97.9 F (36.6 C) 98.8 F (37.1 C) 98.6 F (37 C)  TempSrc: Oral Oral Oral Oral  SpO2: 97% 99%    Weight:      Height:          Latest Ref Rng & Units 05/30/2021    1:45 AM 05/29/2021    2:15 AM 05/28/2021    3:41 AM  CBC  WBC 4.0 - 10.5 K/uL 6.4   9.5   8.4    Hemoglobin 13.0 - 17.0 g/dL 6.8   8.8   11.1    Hematocrit 39.0 - 52.0 % 21.0   27.2   34.6    Platelets 150 - 400 K/uL 72   PLATELET CLUMPS NOTED ON SMEAR, UNABLE TO ESTIMATE   93        Latest Ref Rng & Units 05/30/2021    1:45 AM 05/29/2021    2:15 AM 05/28/2021    3:41 AM  BMP  Glucose 70 - 99 mg/dL 201   185   233    BUN 8 - 23 mg/dL 31   24   20     Creatinine 0.61 - 1.24 mg/dL 1.18   1.03   0.93    Sodium 135 - 145 mmol/L 134   132   136    Potassium 3.5 - 5.1 mmol/L 4.3   4.3   4.1    Chloride 98 - 111 mmol/L 104   103   109    CO2 22 - 32 mmol/L 22   20   22     Calcium 8.9 - 10.3 mg/dL 8.3   8.4   8.8     Intake/Output      05/27 0701 05/28 0700 05/28 0701 05/29 0700   P.O.     I.V. (mL/kg)     IV Piggyback 300    Total Intake(mL/kg) 300 (4.4)    Urine (mL/kg/hr)  650 (13.9)   Blood     Total Output  650   Net +300 -650           Physical Exam: General: NAD.  Laying in bed, looks very drowsy, getting blood transfused Resp: No increased wob Cardio: regular rate and rhythm ABD soft Neurologically intact MSK Neurovascularly intact Sensation intact distally Intact pulses distally Dorsiflexion/Plantar flexion intact Incision: dressing C/D/I   Assessment: 2 Days Post-Op  S/P Procedure(s)  (LRB): INTRAMEDULLARY (IM) NAIL INTERTROCHANTRIC (Left) by Dr. Doreatha Martin on 05/28/21  Principal Problem:   Hip fracture ([place]) Active Problems:   Diabetes mellitus ([place])   Hyperlipidemia associated with type 2 diabetes mellitus ([place])   Hypertension associated with diabetes ([place])   Vitamin D deficiency   Single kidney   COPD (chronic obstructive pulmonary disease) ([facility])   Malignant neoplasm of upper lobe of left lung ([place])   Malignant melanoma ([place])   CVA (cerebral vascular accident) ([place])   Plan: H/H dropped to 6.8 this morning. Looks like 1 unit blood has been ordered  Advance diet Up with therapy Incentive Spirometry Elevate and Apply  ice  Weightbearing: WBAT LLE Insicional and dressing care: Dressings left intact until follow-up and Reinforce dressings as needed Orthopedic device(s): None Showering: Keep dressing dry VTE prophylaxis:  on hold due to low H/H  , SCDs, ambulation Pain control: continue current regimen Follow - up plan:  Dr. Doreatha Martin   Dispo: Inpatient Rehab  recommended by PT/OT. Patient eager to get to rehab.      Britt Bottom, PA-C Office [phone] 05/30/2021, 7:42 AM

## 2021-05-30 NOTE — Progress Notes (Signed)
PROGRESS NOTE    Dalton Miller  KZS:[id number] DOB: Jun 08, 1945 DOA: 05/27/2021 PCP: [facility]   Brief Narrative:  Dalton Miller  is a 76 y.o. male, with history of metastatic melanoma, lung CA, urothelial CA, GI bleed, CVA, with a prolonged hospitalization last December/January for acute CVA, GI bleed due to liver cirrhosis with varices, COVID-19 infection, patient recovered and he was discharged home from [facility] on 01/29/2021.  He receives all of his care at [facility], he is following with oncologist over there, so far he tells me he is in remission, but I have no access to [place] records. Patient presented to ED secondary to mechanical fall and was noted to have a left hip fracture.  He was [facility] from [facility] to [facility] for surgical management and has undergone IM nail to left intertrochanteric femur fracture on 5/26.  Assessment & Plan:   Left hip fracture status post IM nail 5/26 -Secondary to mechanical fall -Appreciate further orthopedics recommendations -Continue as needed pain medications and monitor H&H closely. -Patient weightbearing as tolerated to left lower extremity.   -Evaluated by PT recommended CIR -Ortho recommended DOAC upon discharge for 30 days. -Encouraged to use spirometry  Acute blood loss anemia: -H&H dropped from 11.1-8.8-6.8.   -Transfuse 1 unit PRBC on 5/28.   -Continue to monitor closely.   Hepatic cirrhosis/esophageal varices -Normal liver enzymes and INR. -Continue with PPI, rifaximin and lactulose   History of CVA -Continue statin hold aspirin for now given thrombocytopenia   Diabetes mellitus type 2 -Well-controlled.  A1c 6.4%.  Continue sliding scale insulin and monitor blood sugar closely  Hypertension -Continue amlodipine   History of lung cancer History of metastatic recurrent melanoma Urothelial CA -Is been followed by Dr. Darnell Level in [place] of COPD: -He was counseled on cessation -Not on  any inhalers at home.  Requiring 1 L of oxygen. -We will try to wean off of oxygen as tolerated.   Thrombocytopenia -Due to liver cirrhosis, no indication for transfusion -Continue to monitor  DVT prophylaxis: SCD Code Status: Full code Family Communication:  None present at bedside.  Plan of care discussed with patient in length and he verbalized understanding and agreed with it. Disposition Plan: CIR   Consultants:  Orthopedic surgery  Procedures:  Intramedullary nail intertrochanteric left  Antimicrobials:  Cefazolin  Status is: Inpatient    Subjective: Patient seen and examined.  Sitting comfortably on the bed.  Tells me that he could not sleep last night.  He denies melena or blood in his urine.  No bleeding from the surgery site.  No fever.  Continues to have cough, has been using spirometer.  No acute events overnight.  Objective: Vitals:   05/30/21 0438 05/30/21 0833 05/30/21 0951 05/30/21 1007  BP: (!) 121/57 115/75 113/63 (!) 126/55  Pulse:  97 92 100  Resp:  15 15 (!) 21  Temp: 98.6 F (37 C) 98.5 F (36.9 C) 98.4 F (36.9 C) 98.1 F (36.7 C)  TempSrc: Oral Oral Oral Oral  SpO2:  98% 100% 100%  Weight:      Height:        Intake/Output Summary (Last 24 hours) at 05/30/2021 1014 Last data filed at 05/30/2021 0713 Gross per 24 hour  Intake 300 ml  Output 650 ml  Net -350 ml    Filed Weights   05/27/21 1831 05/28/21 1737  Weight: 68 kg 68 kg    Examination:  General exam:  Appears calm and comfortable, on nasal cannula, communicating well, appears pale Respiratory system: Clear to auscultation. Respiratory effort normal. Cardiovascular system: S1 & S2 heard, RRR. No JVD, murmurs, rubs, gallops or clicks. No pedal edema. Gastrointestinal system: Abdomen is nondistended, soft and nontender. No organomegaly or masses felt. Normal bowel sounds heard. Central nervous system: Alert and oriented. No focal neurological deficits. Left hip: Dressing dry and  intact.  No signs of active bleeding or discharge seen. Skin: No rashes, lesions or ulcers Psychiatry: Judgement and insight appear normal. Mood & affect appropriate.    Data Reviewed: I have personally reviewed following labs and imaging studies  CBC: Recent Labs  Lab 05/27/21 2053 05/28/21 0341 05/29/21 0215 05/30/21 0145  WBC 7.6 8.4 9.5 6.4  HGB 11.6* 11.1* 8.8* 6.8*  HCT 36.7* 34.6* 27.2* 21.0*  MCV 89.5 89.9 89.8 88.6  PLT 95* 93* PLATELET CLUMPS NOTED ON SMEAR, UNABLE TO ESTIMATE 72*    Basic Metabolic Panel: Recent Labs  Lab 05/27/21 2053 05/28/21 0341 05/29/21 0215 05/30/21 0145  NA 137 136 132* 134*  K 4.1 4.1 4.3 4.3  CL 107 109 103 104  CO2 22 22 20* 22  GLUCOSE 202* 233* 185* 201*  BUN 19 20 24* 31*  CREATININE 0.92 0.93 1.03 1.18  CALCIUM 9.2 8.8* 8.4* 8.3*  MG  --   --  1.7  --     GFR: Estimated Creatinine Clearance: 50.6 mL/min (by C-G formula based on SCr of 1.18 mg/dL). Liver Function Tests: Recent Labs  Lab 05/27/21 2053 05/30/21 0145  AST 22 15  ALT 26 11  ALKPHOS 116 83  BILITOT 0.9 0.5  PROT 7.3 5.1*  ALBUMIN 4.1 2.7*    No results for input(s): LIPASE, AMYLASE in the last 168 hours. No results for input(s): AMMONIA in the last 168 hours. Coagulation Profile: Recent Labs  Lab 05/27/21 2053  INR 1.0    Cardiac Enzymes: No results for input(s): CKTOTAL, CKMB, CKMBINDEX, TROPONINI in the last 168 hours. BNP (last 3 results) No results for input(s): PROBNP in the last 8760 hours. HbA1C: Recent Labs    05/28/21 1828  HGBA1C 6.4*    CBG: Recent Labs  Lab 05/29/21 0731 05/29/21 1108 05/29/21 1602 05/29/21 2143 05/30/21 0828  GLUCAP 172* 209* 167* 242* 197*    Lipid Profile: No results for input(s): CHOL, HDL, LDLCALC, TRIG, CHOLHDL, LDLDIRECT in the last 72 hours. Thyroid Function Tests: No results for input(s): TSH, T4TOTAL, FREET4, T3FREE, THYROIDAB in the last 72 hours. Anemia Panel: No results for input(s):  VITAMINB12, FOLATE, FERRITIN, TIBC, IRON, RETICCTPCT in the last 72 hours. Sepsis Labs: No results for input(s): PROCALCITON, LATICACIDVEN in the last 168 hours.  No results found for this or any previous visit (from the past 240 hour(s)).    Radiology Studies: DG C-Arm 1-60 Min-No Report  Result Date: 05/28/2021 Fluoroscopy was utilized by the requesting physician.  No radiographic interpretation.   DG HIP UNILAT W OR W/O PELVIS 2-3 VIEWS LEFT  Result Date: 05/28/2021 CLINICAL DATA:  Provided history: Postop left hip. EXAM: DG HIP (WITH OR WITHOUT PELVIS) 2-3V LEFT COMPARISON:  Intra procedural fluoroscopic images of the left hip 05/28/2021. Radiographs of the left hip 05/27/2021. FINDINGS: Immediate postoperative changes from left femoral ORIF for intertrochanteric left femur fracture. An intramedullary nail is present within the proximal left femur. Two interlocking screws proximally, traversing the left femoral head/neck. An additional interlocking screw is present more distally. Overlying soft tissue swelling and subcutaneous emphysema. IMPRESSION:  Immediate postoperative changes from left femoral ORIF for intertrochanteric left femur fracture. Electronically Signed   By: Kellie Simmering D.O.   On: 05/28/2021 14:03   DG HIP UNILAT WITH PELVIS 2-3 VIEWS LEFT  Result Date: 05/28/2021 CLINICAL DATA:  ORIF of proximal left femur fracture. EXAM: DG HIP (WITH OR WITHOUT PELVIS) 2-3V LEFT COMPARISON:  05/27/2021 FINDINGS: Status post ORIF of intertrochanteric fracture of the proximal left fever. Images demonstrate placement of IM nail and 2 cannulated screws along the right femoral neck. IMPRESSION: Status post ORIF of intertrochanteric fracture of the proximal left femur. Electronically Signed   By: Kerby Moors M.D.   On: 05/28/2021 13:32    Scheduled Meds:  amLODipine  5 mg Oral Daily   atorvastatin  10 mg Oral Daily   Chlorhexidine Gluconate Cloth  6 each Topical Daily   docusate sodium  100  mg Oral BID   insulin aspart  0-5 Units Subcutaneous QHS   insulin aspart  0-9 Units Subcutaneous TID WC   lactulose  10 g Oral QODAY   melatonin  6 mg Oral QHS   multivitamin with minerals  1 tablet Oral Daily   pantoprazole  40 mg Oral BID   polyvinyl alcohol  1 drop Both Eyes QID   rifaximin  550 mg Oral BID   Continuous Infusions:  methocarbamol (ROBAXIN) IV       LOS: 3 days   Time spent: 35 minutes   Dewel Lotter Loann Quill, MD [facility]  If 7PM-7AM, please contact night-coverage [email or web address] 05/30/2021, 10:14 AM

## 2021-05-31 ENCOUNTER — Encounter (HOSPITAL_COMMUNITY): Payer: Self-pay | Admitting: Student

## 2021-05-31 DIAGNOSIS — J449 Chronic obstructive pulmonary disease, unspecified: Secondary | ICD-10-CM | POA: Diagnosis not present

## 2021-05-31 DIAGNOSIS — S72002A Fracture of unspecified part of neck of left femur, initial encounter for closed fracture: Secondary | ICD-10-CM | POA: Diagnosis not present

## 2021-05-31 DIAGNOSIS — E1159 Type 2 diabetes mellitus with other circulatory complications: Secondary | ICD-10-CM | POA: Diagnosis not present

## 2021-05-31 DIAGNOSIS — E1169 Type 2 diabetes mellitus with other specified complication: Secondary | ICD-10-CM | POA: Diagnosis not present

## 2021-05-31 LAB — COMPREHENSIVE METABOLIC PANEL
ALT: 9 U/L (ref 0–44)
AST: 17 U/L (ref 15–41)
Albumin: 2.6 g/dL — ABNORMAL LOW (ref 3.5–5.0)
Alkaline Phosphatase: 87 U/L (ref 38–126)
Anion gap: 6 (ref 5–15)
BUN: 28 mg/dL — ABNORMAL HIGH (ref 8–23)
CO2: 23 mmol/L (ref 22–32)
Calcium: 8.3 mg/dL — ABNORMAL LOW (ref 8.9–10.3)
Chloride: 105 mmol/L (ref 98–111)
Creatinine, Ser: 1.17 mg/dL (ref 0.61–1.24)
GFR, Estimated: >60 mL/min (ref >60)
Glucose, Bld: 157 mg/dL — ABNORMAL HIGH (ref 70–99)
Potassium: 4.1 mmol/L (ref 3.5–5.1)
Sodium: 134 mmol/L — ABNORMAL LOW (ref 135–145)
Total Bilirubin: 0.8 mg/dL (ref 0.3–1.2)
Total Protein: 5.3 g/dL — ABNORMAL LOW (ref 6.5–8.1)

## 2021-05-31 LAB — CBC WITH DIFFERENTIAL/PLATELET
Abs Immature Granulocytes: 0.03 10*3/uL (ref 0.00–0.07)
Basophils Absolute: 0 10*3/uL (ref 0.0–0.1)
Basophils Relative: 1 %
Eosinophils Absolute: 0.1 10*3/uL (ref 0.0–0.5)
Eosinophils Relative: 1 %
HCT: 22.3 % — ABNORMAL LOW (ref 39.0–52.0)
Hemoglobin: 7.4 g/dL — ABNORMAL LOW (ref 13.0–17.0)
Immature Granulocytes: 1 %
Lymphocytes Relative: 28 %
Lymphs Abs: 1.7 10*3/uL (ref 0.7–4.0)
MCH: 29.4 pg (ref 26.0–34.0)
MCHC: 33.2 g/dL (ref 30.0–36.0)
MCV: 88.5 fL (ref 80.0–100.0)
Monocytes Absolute: 0.8 10*3/uL (ref 0.1–1.0)
Monocytes Relative: 12 %
Neutro Abs: 3.6 10*3/uL (ref 1.7–7.7)
Neutrophils Relative %: 57 %
Platelets: 76 10*3/uL — ABNORMAL LOW (ref 150–400)
RBC: 2.52 MIL/uL — ABNORMAL LOW (ref 4.22–5.81)
RDW: 17.3 % — ABNORMAL HIGH (ref 11.5–15.5)
WBC: 6.3 10*3/uL (ref 4.0–10.5)
nRBC: 0 % (ref 0.0–0.2)

## 2021-05-31 LAB — GLUCOSE, CAPILLARY
Glucose-Capillary: 170 mg/dL — ABNORMAL HIGH (ref 70–99)
Glucose-Capillary: 161 mg/dL — ABNORMAL HIGH (ref 70–99)
Glucose-Capillary: 201 mg/dL — ABNORMAL HIGH (ref 70–99)
Glucose-Capillary: 158 mg/dL — ABNORMAL HIGH (ref 70–99)

## 2021-05-31 LAB — TYPE AND SCREEN
ABO/RH(D): O POS
Antibody Screen: NEGATIVE
Unit division: 0

## 2021-05-31 LAB — BPAM RBC
Blood Product Expiration Date: 202306022359
ISSUE DATE / TIME: 202305280947
Unit Type and Rh: 5100

## 2021-05-31 MED ORDER — ENOXAPARIN SODIUM 40 MG/0.4ML IJ SOSY
40.0000 mg | PREFILLED_SYRINGE | INTRAMUSCULAR | Status: DC
  Filled 2021-05-31: qty 0.4

## 2021-05-31 MED ORDER — TRAZODONE HCL 50 MG PO TABS
50.0000 mg | ORAL_TABLET | Freq: Every day | ORAL | Status: DC
  Administered 2021-05-31: 50 mg via ORAL
  Filled 2021-05-31: qty 1

## 2021-05-31 NOTE — Progress Notes (Addendum)
PROGRESS NOTE    Dalton Miller  FHL:[id number] DOB: [birth year] DOA: 05/27/2021 PCP: [facility]   Brief Narrative:  Dalton Miller  is a 76 y.o. male, with history of metastatic melanoma, lung CA, urothelial CA, GI bleed, CVA, with a prolonged hospitalization last December/January for acute CVA, GI bleed due to liver cirrhosis with varices, COVID-19 infection, patient recovered and he was discharged home from [facility] on 01/29/2021.  He receives all of his care at [facility], he is following with oncologist over there, so far he tells me he is in remission, but I have no access to [place] records. Patient presented to ED secondary to mechanical fall and was noted to have a left hip fracture.  He was transferred from [facility] to [facility] for surgical management and has undergone IM nail to left intertrochanteric femur fracture on 5/26.  Assessment & Plan:   Left hip fracture: - status post IM nail 5/26 -Continue as needed pain medications and monitor H&H closely. -weightbearing as tolerated to left lower extremity.   -Evaluated by PT recommended CIR (pending evaluation) -Ortho recommended DOAC upon discharge for 30 days. -Encouraged to use spirometry  Acute blood loss anemia: -H&H dropped from 11.1-8.8-6.8.   -Transfuse 1 unit PRBC on 5/28.  H&H is 7.4/22.3 -Continue to monitor closely.   Hepatic cirrhosis/esophageal varices -Normal liver enzymes and INR. -Continue with PPI, rifaximin and lactulose   History of CVA -Continue statin hold aspirin for now given thrombocytopenia   Diabetes mellitus type 2 -Well-controlled.  A1c 6.4%.  Continue sliding scale insulin and monitor blood sugar closely  Hypertension -Continue amlodipine   History of lung cancer History of metastatic recurrent melanoma Urothelial CA -Is been followed by Dr. Darnell Level in [place] of COPD: -He was counseled on cessation -Not on any inhalers at home.  On room air  now.  Thrombocytopenia -Due to liver cirrhosis, no indication for transfusion -Continue to monitor  Insomnia: -Melatonin is not helpful.  Added trazodone at bedtime  Urinary retention: Has Foley in place and does not want this to be out.  DVT prophylaxis: SCD/Lovenox Code Status: Full code Family Communication:  None present at bedside.  Plan of care discussed with patient in length and he verbalized understanding and agreed with it. Disposition Plan: CIR   Consultants:  Orthopedic surgery  Procedures:  Intramedullary nail intertrochanteric left  Antimicrobials:  Cefazolin  Status is: Inpatient    Subjective: Patient seen and examined.  Reports that he feels much better this morning.  His pain is minimal.  His cough has improved.  Reports that he has difficulty sleeping and melatonin is not helpful.  No acute events overnight.  Objective: Vitals:   05/30/21 1620 05/30/21 1900 05/31/21 0300 05/31/21 0805  BP: 105/68 115/67 110/71 114/70  Pulse: 86 95 99   Resp: (!) 21 18 20    Temp: 98.3 F (36.8 C) 98.5 F (36.9 C) 98.6 F (37 C) 98.9 F (37.2 C)  TempSrc: Oral Oral Oral Oral  SpO2: 95% 95% 99%   Weight:      Height:        Intake/Output Summary (Last 24 hours) at 05/31/2021 1027 Last data filed at 05/30/2021 1500 Gross per 24 hour  Intake 560 ml  Output 400 ml  Net 160 ml    Filed Weights   05/27/21 1831 05/28/21 1737  Weight: 68 kg 68 kg    Examination:  General exam: Appears calm and comfortable, communicating  well, on room air Respiratory system: Clear to auscultation. Respiratory effort normal. Cardiovascular system: S1 & S2 heard, RRR. No JVD, murmurs, rubs, gallops or clicks. No pedal edema. Gastrointestinal system: Abdomen is nondistended, soft and nontender. No organomegaly or masses felt. Normal bowel sounds heard. Central nervous system: Alert and oriented. No focal neurological deficits. Left hip: Dressing dry and intact.  No signs of  active bleeding or discharge seen. Skin: No rashes, lesions or ulcers Psychiatry: Judgement and insight appear normal. Mood & affect appropriate.    Data Reviewed: I have personally reviewed following labs and imaging studies  CBC: Recent Labs  Lab 05/27/21 2053 05/28/21 0341 05/29/21 0215 05/30/21 0145 05/31/21 0204  WBC 7.6 8.4 9.5 6.4 6.3  NEUTROABS  --   --   --   --  3.6  HGB 11.6* 11.1* 8.8* 6.8* 7.4*  HCT 36.7* 34.6* 27.2* 21.0* 22.3*  MCV 89.5 89.9 89.8 88.6 88.5  PLT 95* 93* PLATELET CLUMPS NOTED ON SMEAR, UNABLE TO ESTIMATE 72* 76*    Basic Metabolic Panel: Recent Labs  Lab 05/27/21 2053 05/28/21 0341 05/29/21 0215 05/30/21 0145 05/31/21 0204  NA 137 136 132* 134* 134*  K 4.1 4.1 4.3 4.3 4.1  CL 107 109 103 104 105  CO2 22 22 20* 22 23  GLUCOSE 202* 233* 185* 201* 157*  BUN 19 20 24* 31* 28*  CREATININE 0.92 0.93 1.03 1.18 1.17  CALCIUM 9.2 8.8* 8.4* 8.3* 8.3*  MG  --   --  1.7  --   --     GFR: Estimated Creatinine Clearance: 51 mL/min (by C-G formula based on SCr of 1.17 mg/dL). Liver Function Tests: Recent Labs  Lab 05/27/21 2053 05/30/21 0145 05/31/21 0204  AST 22 15 17   ALT 26 11 9   ALKPHOS 116 83 87  BILITOT 0.9 0.5 0.8  PROT 7.3 5.1* 5.3*  ALBUMIN 4.1 2.7* 2.6*    No results for input(s): LIPASE, AMYLASE in the last 168 hours. No results for input(s): AMMONIA in the last 168 hours. Coagulation Profile: Recent Labs  Lab 05/27/21 2053  INR 1.0    Cardiac Enzymes: No results for input(s): CKTOTAL, CKMB, CKMBINDEX, TROPONINI in the last 168 hours. BNP (last 3 results) No results for input(s): PROBNP in the last 8760 hours. HbA1C: Recent Labs    05/28/21 1828  HGBA1C 6.4*    CBG: Recent Labs  Lab 05/29/21 2143 05/30/21 0828 05/30/21 1638 05/30/21 1918 05/31/21 0739  GLUCAP 242* 197* 165* 298* 170*    Lipid Profile: No results for input(s): CHOL, HDL, LDLCALC, TRIG, CHOLHDL, LDLDIRECT in the last 72 hours. Thyroid  Function Tests: No results for input(s): TSH, T4TOTAL, FREET4, T3FREE, THYROIDAB in the last 72 hours. Anemia Panel: No results for input(s): VITAMINB12, FOLATE, FERRITIN, TIBC, IRON, RETICCTPCT in the last 72 hours. Sepsis Labs: No results for input(s): PROCALCITON, LATICACIDVEN in the last 168 hours.  No results found for this or any previous visit (from the past 240 hour(s)).    Radiology Studies: No results found.  Scheduled Meds:  amLODipine  5 mg Oral Daily   atorvastatin  10 mg Oral Daily   Chlorhexidine Gluconate Cloth  6 each Topical Daily   docusate sodium  100 mg Oral BID   insulin aspart  0-5 Units Subcutaneous QHS   insulin aspart  0-9 Units Subcutaneous TID WC   lactulose  10 g Oral QODAY   melatonin  6 mg Oral QHS   multivitamin with minerals  1  tablet Oral Daily   pantoprazole  40 mg Oral BID   polyvinyl alcohol  1 drop Both Eyes QID   rifaximin  550 mg Oral BID   traZODone  50 mg Oral QHS   Continuous Infusions:  methocarbamol (ROBAXIN) IV       LOS: 4 days   Time spent: 35 minutes   Akeiba Axelson Loann Quill, MD [facility]  If 7PM-7AM, please contact night-coverage [email or web address] 05/31/2021, 10:27 AM

## 2021-05-31 NOTE — Progress Notes (Signed)
Patient stated that he did not want to use a urinal, bedpan or ambulate to the bathroom when he needs to urinate. He refused removal of his urinary catheter. MD is aware of this situation.

## 2021-05-31 NOTE — Progress Notes (Signed)
Physical Therapy Treatment Patient Details Name: Dalton Miller MRN: [id number] DOB: 04/08/1945 Today's Date: 05/31/2021   History of Present Illness Pt adm 5/25 with lt intertrochanteric femur fx after a fall. Underwent ORIF on 5/26. PMH - rt CVA, OA, shoulder surgery, renal cell cancer s/p nephrectomy, melanoma with lung mets and urothelial cancer,  HTN, DMII, COPD, R TKA,    PT Comments    Pt refusing PT on entry, but bed pad found to be wet from leaking ice pack. Reluctantly agreeable to rolling for dry pad placement, with min-A. Pt agreeable to bed exercises, while performing talked with PT about his deceased wife and his desire to get back to walking so he can go fishing with his family. Pt agrees exercise was not as painful as he expected and that he will get up with therapy in next session. D/c plans remain appropriate at this time. PT will continue to follow acutely.     Recommendations for follow up therapy are one component of a multi-disciplinary discharge planning process, led by the attending physician.  Recommendations may be updated based on patient status, additional functional criteria and insurance authorization.  Follow Up Recommendations  Acute inpatient rehab (3hours/day)     Assistance Recommended at Discharge Intermittent Supervision/Assistance  Patient can return home with the following A lot of help with walking and/or transfers;A lot of help with bathing/dressing/bathroom;Assistance with cooking/housework;Assist for transportation;Help with stairs or ramp for entrance   Equipment Recommendations  None recommended by PT    Recommendations for Other Services Rehab consult     Precautions / Restrictions Precautions Precautions: Fall Restrictions Weight Bearing Restrictions: Yes LLE Weight Bearing: Weight bearing as tolerated     Mobility  Bed Mobility Overal bed mobility: Needs Assistance Bed Mobility: Rolling Rolling: Min assist         General bed  mobility comments: min A for rolling R and left for placment of dry pad                       Cognition Arousal/Alertness: Awake/alert Behavior During Therapy: WFL for tasks assessed/performed Overall Cognitive Status: Within Functional Limits for tasks assessed                                          Exercises General Exercises - Lower Extremity Ankle Circles/Pumps: AROM, Both, 10 reps, Supine Heel Slides: AROM, Right, AAROM, Left, 10 reps Hip ABduction/ADduction: AROM, Right, AAROM, Left, 10 reps Straight Leg Raises: AROM, Right, AAROM, Left, 10 reps, Supine    General Comments General comments (skin integrity, edema, etc.): VSS on RA, son present for part of session      Pertinent Vitals/Pain Pain Assessment Pain Assessment: Faces Faces Pain Scale: Hurts even more Pain Location: lt hip Pain Descriptors / Indicators: Aching, Sore, Throbbing Pain Intervention(s): Limited activity within patient's tolerance, Monitored during session, Repositioned           PT Goals (current goals can now be found in the care plan section) Acute Rehab PT Goals Patient Stated Goal: return home PT Goal Formulation: With patient Time For Goal Achievement: 06/12/21 Potential to Achieve Goals: Good Progress towards PT goals: Not progressing toward goals - comment    Frequency    Min 3X/week      PT Plan Current plan remains appropriate       AM-PAC PT "6 Clicks"  Mobility   Outcome Measure  Help needed turning from your back to your side while in a flat bed without using bedrails?: A Lot Help needed moving from lying on your back to sitting on the side of a flat bed without using bedrails?: Total Help needed moving to and from a bed to a chair (including a wheelchair)?: Total Help needed standing up from a chair using your arms (e.g., wheelchair or bedside chair)?: Total Help needed to walk in hospital room?: Total Help needed climbing 3-5 steps with a  railing? : Total 6 Click Score: 7    End of Session   Activity Tolerance: Patient tolerated treatment well Patient left: with call bell/phone within reach;in bed;with bed alarm set Nurse Communication: Mobility status PT Visit Diagnosis: Other abnormalities of gait and mobility (R26.89);History of falling (Z91.81);Pain Pain - Right/Left: Left Pain - part of body: Hip     Time: 4975-3005 PT Time Calculation (min) (ACUTE ONLY): 43 min  Charges:  $Therapeutic Exercise: 8-22 mins $Therapeutic Activity: 23-37 mins                     Nazar Kuan B. Migdalia Dk PT, DPT Acute Rehabilitation Services Please use secure chat or  Call Office [phone]    [place] 05/31/2021, 4:58 PM

## 2021-05-31 NOTE — PMR Pre-admission (Signed)
PMR Admission Coordinator Pre-Admission Assessment  Patient: Dalton Miller is an 76 y.o., male MRN: [id number] DOB: [birth year] Height: '5\' 7"'  (170.2 cm) Weight: 68 kg  Insurance Information HMO:     PPO:      PCP:      IPA:      80/20:      OTHER:  PRIMARY: [facility]      Policy#: [id number]      Subscriber: pt CM Name: Rubin Payor      Phone#: [phone]     Fax#: [phone] Pre-Cert#: tbd on admit      Employer:  Benefits:  Phone #: [phone]     Name:  Eff. Date: 06/01/2017     Deduct: none      Out of Pocket Max: none [facility]: per [facility] 100%      SNF: 100% Outpatient: 100%     Co-Pay:  Home Health: 100%      Co-Pay:  DME: 100%     Co-Pay:  Providers: in network with [facility]  SECONDARY: [employer] Medicare      Policy#: [id number]  Financial Counselor:       Phone#:   The "Data Collection Information Summary" for patients in Inpatient Rehabilitation Facilities with attached "Privacy Act [place] Records" was provided and verbally reviewed with: N/A  Emergency Contact Information Contact Information     Name Relation Home Work Mobile   Elgersma,Dawn Daughter [phone]  [phone]   Tarek, Cravens [phone]  [phone]   Northrop,dewayne Son   [phone]      Current Medical History  Patient Admitting Diagnosis: hip fracture  History of Present Illness: 76 year old male with history of metastatic melanoma, lung CA, urothelial CA, liver cirrhosis without h/o alcohol abuse, GI bleed, CVA 12/22,  DM, HTN, COPD, COVID 19 infection. Presented to [facility] on 05/27/21 secondary to fall. Slipped in bathroom. Imaging with left hip fracture. Transferred to [facility] with orthopedic consulted.   Status post IM nail on 05/28/21. Heparin for DVT prophylaxis for 30 days with close follow up of CBC for thrombocytopenia. WBAT to LLE. Continue with rifampin for hepatic cirrhosis. Hold Asa given thrombocytopenia , continue statin. SSI for DM type 2. Continue Home meds for HTN. Monitor CBC.    Patient's medical record from [facility]  has been reviewed by the rehabilitation admission coordinator and physician.  Past Medical History  Past Medical History:  Diagnosis Date   Acquired renal cyst of right kidney    Arthritis    Cancer ([place])    renal cell    Cirrhosis (HCC)    Erectile dysfunction    Heart murmur    CHILDHOOD   Hematuria    left ureteral bleeding   History of cellulitis    2012 left wrist   History of kidney stones    Hyperlipidemia    Hypertension    Metastatic melanoma to lung, left ([place])    Nephrolithiasis    bilateral non-obstructive per ct 07-02-2015   Type 2 diabetes mellitus ([place])    Wears glasses    Has the patient had major surgery during 100 days prior to admission? Yes  Family History   family history includes CAD in his mother; COPD in his son; Cancer in his brother; Congestive Heart Failure in his father; Diabetes in his father and mother; Emphysema in his son; Heart attack in his brother; Heart disease in his mother and son; Stroke in his mother.  Current Medications  Current Facility-Administered Medications:  amLODipine (NORVASC) tablet 5 mg, 5 mg, Oral, Daily, Haddix, Thomasene Lot, MD, 5 mg at 06/01/21 0852   atorvastatin (LIPITOR) tablet 10 mg, 10 mg, Oral, Daily, Haddix, Thomasene Lot, MD, 10 mg at 06/01/21 2458   Chlorhexidine Gluconate Cloth 2 % PADS 6 each, 6 each, Topical, Daily, Haddix, Thomasene Lot, MD, 6 each at 06/01/21 0852   docusate sodium (COLACE) capsule 100 mg, 100 mg, Oral, BID, Haddix, Thomasene Lot, MD, 100 mg at 06/01/21 0851   enoxaparin (LOVENOX) injection 40 mg, 40 mg, Subcutaneous, Q24H, Pahwani, Rinka R, MD   HYDROmorphone (DILAUDID) injection 0.5 mg, 0.5 mg, Intravenous, Q2H PRN, Haddix, Thomasene Lot, MD, 0.5 mg at 05/29/21 0909   insulin aspart (novoLOG) injection 0-5 Units, 0-5 Units, Subcutaneous, QHS, Haddix, Thomasene Lot, MD, 3 Units at 05/30/21 2152   insulin aspart (novoLOG) injection 0-9 Units, 0-9 Units, Subcutaneous,  TID WC, Haddix, Thomasene Lot, MD, 2 Units at 06/01/21 0849   lactulose (CHRONULAC) 10 GM/15ML solution 10 g, 10 g, Oral, QODAY, Haddix, Thomasene Lot, MD   melatonin tablet 6 mg, 6 mg, Oral, QHS, Haddix, Thomasene Lot, MD, 6 mg at 05/31/21 2215   methocarbamol (ROBAXIN) tablet 500 mg, 500 mg, Oral, Q6H PRN, 500 mg at 06/01/21 0858 **OR** methocarbamol (ROBAXIN) 500 mg in dextrose 5 % 50 mL IVPB, 500 mg, Intravenous, Q6H PRN, Haddix, Thomasene Lot, MD   multivitamin with minerals tablet 1 tablet, 1 tablet, Oral, Daily, Haddix, Thomasene Lot, MD, 1 tablet at 06/01/21 0851   oxyCODONE (Oxy IR/ROXICODONE) immediate release tablet 5-10 mg, 5-10 mg, Oral, Q4H PRN, Haddix, Thomasene Lot, MD, 10 mg at 06/01/21 0858   pantoprazole (PROTONIX) EC tablet 40 mg, 40 mg, Oral, BID, Haddix, Thomasene Lot, MD, 40 mg at 06/01/21 0998   polyvinyl alcohol (LIQUIFILM TEARS) 1.4 % ophthalmic solution 1 drop, 1 drop, Both Eyes, QID, Haddix, Thomasene Lot, MD, 1 drop at 05/31/21 2206   rifaximin (XIFAXAN) tablet 550 mg, 550 mg, Oral, BID, Haddix, Thomasene Lot, MD, 550 mg at 06/01/21 0851   traZODone (DESYREL) tablet 50 mg, 50 mg, Oral, QHS, Pahwani, Rinka R, MD, 50 mg at 05/31/21 2215  Patients Current Diet:  Diet Order             Diet - low sodium heart healthy           Diet regular Room service appropriate? Yes; Fluid consistency: Thin  Diet effective now                  Precautions / Restrictions Precautions Precautions: Fall Restrictions Weight Bearing Restrictions: Yes LLE Weight Bearing: Weight bearing as tolerated   Has the patient had 2 or more falls or a fall with injury in the past year? Yes  Prior Activity Level Limited Community (1-2x/wk): Mod I with RW and at times no AD Recent [facility] admit 1/23 after CVA on [facility]'s team  Prior Functional Level Self Care: Did the patient need help bathing, dressing, using the toilet or eating? Independent  Indoor Mobility: Did the patient need assistance with walking from room to room (with or  without device)? Independent  Stairs: Did the patient need assistance with internal or external stairs (with or without device)? Independent  Functional Cognition: Did the patient need help planning regular tasks such as shopping or remembering to take medications? Needed some help  Patient Information Are you of Hispanic, Latino/a,or Spanish origin?: A. No, not of Hispanic, Latino/a, or Spanish origin What is your race?: A. White  Do you need or want an interpreter to communicate with a doctor or health care staff?: 0. No  Patient's Response To:  Health Literacy and Transportation Is the patient able to respond to health literacy and transportation needs?: Yes Health Literacy - How often do you need to have someone help you when you read instructions, pamphlets, or other written material from your doctor or pharmacy?: Never In the past 12 months, has lack of transportation kept you from medical appointments or from getting medications?: No In the past 12 months, has lack of transportation kept you from meetings, work, or from getting things needed for daily living?: No  Development worker, international aid / [place]: Cane - single point, BSC/3in1, Conservation officer, nature (2 wheels), Rollator (4 wheels), Shower seat  Prior Device Use: Indicate devices/aids used by the patient prior to current illness, exacerbation or injury? Walker  Current Functional Level Cognition  Overall Cognitive Status: Within Functional Limits for tasks assessed Orientation Level: Oriented X4    Extremity Assessment (includes Sensation/Coordination)  Upper Extremity Assessment: Generalized weakness, LUE deficits/detail LUE Deficits / Details: hx of L side weakness from previous CVA  Lower Extremity Assessment: Defer to PT evaluation LLE Deficits / Details: Limited by pain and residual weakness from recent CVA.    ADLs  Overall ADL's : Needs assistance/impaired Eating/Feeding: Independent Grooming: Wash/dry  hands, Wash/dry face, Set up, Supervision/safety, Sitting Upper Body Bathing: Set up, Supervision/ safety, Sitting Lower Body Bathing: Maximal assistance Upper Body Dressing : Set up, Supervision/safety, Sitting Lower Body Dressing: Maximal assistance Toilet Transfer: Moderate assistance, Cueing for safety Toilet Transfer Details (indicate cue type and reason): sit - stand from recliner attemptting to transfer to [facility], however pt unable to take steps but abe to [facility] and Hygiene: Maximal assistance Functional mobility during ADLs: Moderate assistance, Cueing for safety, Rolling walker (2 wheels)    Mobility  Overal bed mobility: Needs Assistance Bed Mobility: Rolling Rolling: Min assist Supine to sit: +2 for physical assistance, Mod assist General bed mobility comments: min A for rolling R and left for placment of dry pad    Transfers  Overall transfer level: Needs assistance Equipment used: Rolling walker (2 wheels), Ambulation equipment used Transfers: Sit to/from Stand, Bed to chair/wheelchair/BSC Sit to Stand: Mod assist Bed to/from chair/wheelchair/BSC transfer type:: Stand pivot Transfer via Lift Equipment: Stedy General transfer comment: stood from recliner with RW, but unable to take steps toward [facility]    Ambulation / Gait / Stairs / Emergency planning/management officer  Ambulation/Gait Ambulation/Gait assistance: +2 physical assistance, Herbalist (Feet): 1 Feet Assistive device: Rolling walker (2 wheels) Gait Pattern/deviations: Step-to pattern, Decreased step length - right, Decreased step length - left, Decreased stance time - left, Decreased weight shift to left, Antalgic General Gait Details: Pt with 4 very small shuffling steps forward. Pt with limited tolerance of weight on LLE. Assist for balance and support. Gait velocity: decr Gait velocity interpretation: <1.31 ft/sec, indicative of household ambulator    Posture / Balance  Balance Overall balance assessment: Needs assistance Sitting-balance support: No upper extremity supported, Feet supported Sitting balance-Leahy Scale: Good Standing balance support: Bilateral upper extremity supported, During functional activity, Reliant on assistive device for balance Standing balance-Leahy Scale: Poor Standing balance comment: walker and min assist for static standing    Special needs/care consideration    Previous Home Environment  Living Arrangements: Children  Lives With: Family Available Help at Discharge: Family, Available 24 hours/day Type of Home: Mobile home Home  Layout: One level Home Access: Level entry Bathroom Shower/Tub: Optometrist: Yes Home Care Services: No  Discharge Living Setting Plans for Discharge Living Setting: Patient's home, Lives with (comment), Mobile Home (family) Type of Home at Discharge: Mobile home Discharge Home Layout: One level Discharge Home Access: Level entry Discharge Bathroom Shower/Tub: Tub/shower unit Discharge Bathroom Toilet: Standard Discharge Bathroom Accessibility: Yes How Accessible: Accessible via walker Does the patient have any problems obtaining your medications?: No  Social/Family/Support Systems Patient Roles: Parent Contact Information: son Dewayne Anticipated Caregiver: family Anticipated Caregiver's Contact Information: see contacts Caregiver Availability: 24/7 Discharge Plan Discussed with Primary Caregiver: Yes Is Caregiver In Agreement with Plan?: Yes Does Caregiver/Family have Issues with Lodging/Transportation while Pt is in Rehab?: No  Goals Patient/Family Goal for Rehab: mod I to intermittent assist with PT and OT Expected length of stay: ELOS 10 to 12 days Pt/Family Agrees to Admission and willing to participate: Yes Program Orientation Provided & Reviewed with Pt/Caregiver Including Roles  & Responsibilities: Yes  Decrease burden  of Care through IP rehab admission: n/a  Possible need for SNF placement upon discharge: not anticipated  Patient Condition: I have reviewed medical records from [facility] , spoken with CM, and patient. I met with patient at the bedside for inpatient rehabilitation assessment.  Patient will benefit from ongoing PT and OT, can actively participate in 3 hours of therapy a day 5 days of the week, and can make measurable gains during the admission.  Patient will also benefit from the coordinated team approach during an Inpatient Acute Rehabilitation admission.  The patient will receive intensive therapy as well as Rehabilitation physician, nursing, social worker, and care management interventions.  Due to bladder management, bowel management, safety, skin/wound care, disease management, medication administration, pain management, and patient education the patient requires 24 hour a day rehabilitation nursing.  The patient is currently mod assist overall with mobility and basic ADLs.  Discharge setting and therapy post discharge at home with home health is anticipated.  Patient has agreed to participate in the Acute Inpatient Rehabilitation Program and will admit today.  Preadmission Screen Completed By:  Cleatrice Burke, 06/01/2021 10:36 AM ______________________________________________________________________   Discussed status with Dr. Dagoberto Ligas on 06/01/21 at 1036 and received approval for admission today.  Admission Coordinator:  Cleatrice Burke, RN, time  1610 Date 06/01/21   Assessment/Plan: Diagnosis: Does the need for close, 24 hr/day Medical supervision in concert with the patient's rehab needs make it unreasonable for this patient to be served in a less intensive setting? Yes Co-Morbidities requiring supervision/potential complications: COPD, multiple cancers; cirrhosis, thrombocytopenia; L hip fx s/p IM nail- WBAT Due to bladder management, bowel management, safety,  skin/wound care, disease management, medication administration, pain management, and patient education, does the patient require 24 hr/day rehab nursing? Yes Does the patient require coordinated care of a physician, rehab nurse, PT, OT, and SLP to address physical and functional deficits in the context of the above medical diagnosis(es)? Yes Addressing deficits in the following areas: balance, endurance, locomotion, strength, transferring, bowel/bladder control, bathing, dressing, feeding, grooming, and toileting Can the patient actively participate in an intensive therapy program of at least 3 hrs of therapy 5 days a week? Yes The potential for patient to make measurable gains while on inpatient rehab is good Anticipated functional outcomes upon discharge from inpatient rehab: modified independent and supervision PT, modified independent and supervision OT, n/a SLP Estimated rehab length of stay to reach the  above functional goals is: 10-12 days Anticipated discharge destination: Home 10. Overall Rehab/Functional Prognosis: good   MD Signature:

## 2021-05-31 NOTE — Progress Notes (Signed)
Inpatient Rehabilitation Admissions Coordinator   I met with patient at bedside. He has had recent Cir admit 1/23 . We discussed goals and expectations of a possible Cir admit. He prefers Cir again. I left message for his son Margurite Auerbach, to discuss [place] venue again. I will begin insurance Auth with the [place] for possible Cir admit. [facility].  Danne Baxter, RN, MSN Rehab Admissions Coordinator [phone] 05/31/2021 11:09 AM

## 2021-05-31 NOTE — TOC CAGE-AID Note (Signed)
Transition of Care [facility]) - CAGE-AID Screening   Patient Details  Name: Dalton Miller MRN: [id number] Date of Birth: 1945/03/19  [facility]) CM/SW Contact:    Cortny Bambach C Tarpley-Carter, LCSWA Phone Number: 05/31/2021, 9:16 AM   Clinical Narrative: Pt participated in [place].  Pt stated he does not use substance or ETOH.  Pt was not offered resources, due to no usage of substance or ETOH.    Brande Uncapher Tarpley-Carter, MSW, LCSW-A Pronouns:  She/Her/Hers [facility] Clinical Social Worker Direct Number:  [phone] [email or web address]  CAGE-AID Screening:    Have You Ever Felt You Ought to Cut Down on Your Drinking or Drug Use?: No Have People Annoyed You By [employer] Your Drinking Or Drug Use?: No Have You Felt Bad Or Guilty About Your Drinking Or Drug Use?: No Have You Ever Had a Drink or Used Drugs First Thing In The Morning to Steady Your Nerves or to Get Rid of a Hangover?: No CAGE-AID Score: 0  Substance Abuse Education Offered: No

## 2021-06-01 ENCOUNTER — Encounter (HOSPITAL_COMMUNITY): Payer: Self-pay | Admitting: Physical Medicine and Rehabilitation

## 2021-06-01 ENCOUNTER — Inpatient Hospital Stay (HOSPITAL_COMMUNITY)
Admission: RE | Admit: 2021-06-01 | Discharge: 2021-06-18 | DRG: 560 | Disposition: A | Discharge status: Home Health | Payer: No Typology Code available for payment source | Source: Intra-hospital | Attending: Physical Medicine and Rehabilitation | Admitting: Physical Medicine and Rehabilitation

## 2021-06-01 ENCOUNTER — Other Ambulatory Visit: Payer: Self-pay

## 2021-06-01 DIAGNOSIS — K7460 Unspecified cirrhosis of liver: Secondary | ICD-10-CM | POA: Diagnosis present

## 2021-06-01 DIAGNOSIS — F17210 Nicotine dependence, cigarettes, uncomplicated: Secondary | ICD-10-CM | POA: Diagnosis present

## 2021-06-01 DIAGNOSIS — Z85820 Personal history of malignant melanoma of skin: Secondary | ICD-10-CM

## 2021-06-01 DIAGNOSIS — C7802 Secondary malignant neoplasm of left lung: Secondary | ICD-10-CM | POA: Diagnosis present

## 2021-06-01 DIAGNOSIS — Z7409 Other reduced mobility: Secondary | ICD-10-CM | POA: Diagnosis not present

## 2021-06-01 DIAGNOSIS — W19XXXD Unspecified fall, subsequent encounter: Secondary | ICD-10-CM | POA: Diagnosis present

## 2021-06-01 DIAGNOSIS — Z7982 Long term (current) use of aspirin: Secondary | ICD-10-CM

## 2021-06-01 DIAGNOSIS — R296 Repeated falls: Secondary | ICD-10-CM | POA: Diagnosis present

## 2021-06-01 DIAGNOSIS — K5901 Slow transit constipation: Secondary | ICD-10-CM | POA: Diagnosis not present

## 2021-06-01 DIAGNOSIS — Z8616 Personal history of COVID-19: Secondary | ICD-10-CM | POA: Diagnosis not present

## 2021-06-01 DIAGNOSIS — Z96651 Presence of right artificial knee joint: Secondary | ICD-10-CM | POA: Diagnosis present

## 2021-06-01 DIAGNOSIS — S72142D Displaced intertrochanteric fracture of left femur, subsequent encounter for closed fracture with routine healing: Secondary | ICD-10-CM | POA: Diagnosis present

## 2021-06-01 DIAGNOSIS — R7989 Other specified abnormal findings of blood chemistry: Secondary | ICD-10-CM

## 2021-06-01 DIAGNOSIS — I10 Essential (primary) hypertension: Secondary | ICD-10-CM | POA: Diagnosis present

## 2021-06-01 DIAGNOSIS — E119 Type 2 diabetes mellitus without complications: Secondary | ICD-10-CM | POA: Diagnosis not present

## 2021-06-01 DIAGNOSIS — Z85528 Personal history of other malignant neoplasm of kidney: Secondary | ICD-10-CM | POA: Diagnosis not present

## 2021-06-01 DIAGNOSIS — S72002A Fracture of unspecified part of neck of left femur, initial encounter for closed fracture: Secondary | ICD-10-CM

## 2021-06-01 DIAGNOSIS — D649 Anemia, unspecified: Secondary | ICD-10-CM | POA: Diagnosis not present

## 2021-06-01 DIAGNOSIS — R339 Retention of urine, unspecified: Secondary | ICD-10-CM | POA: Diagnosis not present

## 2021-06-01 DIAGNOSIS — F17290 Nicotine dependence, other tobacco product, uncomplicated: Secondary | ICD-10-CM | POA: Diagnosis present

## 2021-06-01 DIAGNOSIS — S72009A Fracture of unspecified part of neck of unspecified femur, initial encounter for closed fracture: Secondary | ICD-10-CM | POA: Diagnosis present

## 2021-06-01 DIAGNOSIS — E785 Hyperlipidemia, unspecified: Secondary | ICD-10-CM | POA: Diagnosis present

## 2021-06-01 DIAGNOSIS — S72002D Fracture of unspecified part of neck of left femur, subsequent encounter for closed fracture with routine healing: Secondary | ICD-10-CM | POA: Diagnosis not present

## 2021-06-01 DIAGNOSIS — J449 Chronic obstructive pulmonary disease, unspecified: Secondary | ICD-10-CM | POA: Diagnosis present

## 2021-06-01 DIAGNOSIS — K5900 Constipation, unspecified: Secondary | ICD-10-CM | POA: Diagnosis not present

## 2021-06-01 DIAGNOSIS — S72001A Fracture of unspecified part of neck of right femur, initial encounter for closed fracture: Secondary | ICD-10-CM | POA: Diagnosis not present

## 2021-06-01 DIAGNOSIS — E871 Hypo-osmolality and hyponatremia: Secondary | ICD-10-CM | POA: Diagnosis not present

## 2021-06-01 DIAGNOSIS — D62 Acute posthemorrhagic anemia: Secondary | ICD-10-CM

## 2021-06-01 LAB — BASIC METABOLIC PANEL WITH GFR
Anion gap: 7 (ref 5–15)
BUN: 25 mg/dL — ABNORMAL HIGH (ref 8–23)
CO2: 22 mmol/L (ref 22–32)
Calcium: 8.4 mg/dL — ABNORMAL LOW (ref 8.9–10.3)
Chloride: 104 mmol/L (ref 98–111)
Creatinine, Ser: 0.91 mg/dL (ref 0.61–1.24)
GFR, Estimated: >60 mL/min
Glucose, Bld: 217 mg/dL — ABNORMAL HIGH (ref 70–99)
Potassium: 4 mmol/L (ref 3.5–5.1)
Sodium: 133 mmol/L — ABNORMAL LOW (ref 135–145)

## 2021-06-01 LAB — BASIC METABOLIC PANEL
Anion gap: 5 (ref 5–15)
BUN: 22 mg/dL (ref 8–23)
CO2: 25 mmol/L (ref 22–32)
Calcium: 8.8 mg/dL — ABNORMAL LOW (ref 8.9–10.3)
Chloride: 103 mmol/L (ref 98–111)
Creatinine, Ser: 0.96 mg/dL (ref 0.61–1.24)
GFR, Estimated: >60 mL/min (ref >60)
Glucose, Bld: 128 mg/dL — ABNORMAL HIGH (ref 70–99)
Potassium: 4.1 mmol/L (ref 3.5–5.1)
Sodium: 133 mmol/L — ABNORMAL LOW (ref 135–145)

## 2021-06-01 LAB — GLUCOSE, CAPILLARY
Glucose-Capillary: 171 mg/dL — ABNORMAL HIGH (ref 70–99)
Glucose-Capillary: 166 mg/dL — ABNORMAL HIGH (ref 70–99)
Glucose-Capillary: 200 mg/dL — ABNORMAL HIGH (ref 70–99)
Glucose-Capillary: 153 mg/dL — ABNORMAL HIGH (ref 70–99)

## 2021-06-01 LAB — CBC
HCT: 21.5 % — ABNORMAL LOW (ref 39.0–52.0)
Hemoglobin: 7.3 g/dL — ABNORMAL LOW (ref 13.0–17.0)
MCH: 29.6 pg (ref 26.0–34.0)
MCHC: 34 g/dL (ref 30.0–36.0)
MCV: 87 fL (ref 80.0–100.0)
Platelets: 88 10*3/uL — ABNORMAL LOW (ref 150–400)
RBC: 2.47 MIL/uL — ABNORMAL LOW (ref 4.22–5.81)
RDW: 16.7 % — ABNORMAL HIGH (ref 11.5–15.5)
WBC: 4.9 10*3/uL (ref 4.0–10.5)
nRBC: 0 % (ref 0.0–0.2)

## 2021-06-01 MED ORDER — ALUM & MAG HYDROXIDE-SIMETH 200-200-20 MG/5ML PO SUSP: 30.0000 mL | ORAL | Status: DC | PRN

## 2021-06-01 MED ORDER — APIXABAN 2.5 MG PO TABS: 2.5000 mg | ORAL_TABLET | Freq: Two times a day (BID) | ORAL | Status: DC

## 2021-06-01 MED ORDER — LACTULOSE 10 GM/15ML PO SOLN
10.0000 g | ORAL | Status: DC
  Administered 2021-06-03 – 2021-06-17 (×4): 10 g via ORAL
  Filled 2021-06-01 (×9): qty 15

## 2021-06-01 MED ORDER — ACETAMINOPHEN 325 MG PO TABS
325.0000 mg | ORAL_TABLET | ORAL | Status: DC | PRN
  Administered 2021-06-01: 650 mg via ORAL
  Administered 2021-06-02: 325 mg via ORAL
  Administered 2021-06-03: 650 mg via ORAL
  Filled 2021-06-01 (×3): qty 2
  Filled 2021-06-01: qty 1
  Filled 2021-06-01: qty 2

## 2021-06-01 MED ORDER — INSULIN ASPART 100 UNIT/ML IJ SOLN: 0.0000 [IU] | Freq: Every day | INTRAMUSCULAR | Status: DC

## 2021-06-01 MED ORDER — ATORVASTATIN CALCIUM 10 MG PO TABS
10.0000 mg | ORAL_TABLET | Freq: Every day | ORAL | Status: DC
  Administered 2021-06-02 – 2021-06-17 (×16): 10 mg via ORAL
  Filled 2021-06-01 (×16): qty 1

## 2021-06-01 MED ORDER — TRAZODONE HCL 50 MG PO TABS: 50.0000 mg | ORAL_TABLET | Freq: Every day | ORAL | Status: DC

## 2021-06-01 MED ORDER — TRAZODONE HCL 50 MG PO TABS
50.0000 mg | ORAL_TABLET | Freq: Every day | ORAL | Status: DC
  Administered 2021-06-01 – 2021-06-17 (×17): 50 mg via ORAL
  Filled 2021-06-01 (×17): qty 1

## 2021-06-01 MED ORDER — OXYCODONE HCL 5 MG PO TABS
5.0000 mg | ORAL_TABLET | ORAL | Status: DC | PRN
  Administered 2021-06-01 – 2021-06-09 (×16): 10 mg via ORAL
  Administered 2021-06-10: 5 mg via ORAL
  Administered 2021-06-10 – 2021-06-18 (×7): 10 mg via ORAL
  Filled 2021-06-01 (×24): qty 2

## 2021-06-01 MED ORDER — LIDOCAINE HCL URETHRAL/MUCOSAL 2 % EX GEL: CUTANEOUS | Status: DC | PRN

## 2021-06-01 MED ORDER — LACTULOSE 10 GM/15ML PO SOLN: 10.0000 g | ORAL | 0 refills | Status: DC

## 2021-06-01 MED ORDER — ADULT MULTIVITAMIN W/MINERALS CH
1.0000 | ORAL_TABLET | Freq: Every day | ORAL | Status: DC
  Administered 2021-06-02 – 2021-06-18 (×17): 1 via ORAL
  Filled 2021-06-01 (×17): qty 1

## 2021-06-01 MED ORDER — ASPIRIN 81 MG PO TBEC
81.0000 mg | DELAYED_RELEASE_TABLET | Freq: Every day | ORAL | Status: DC
  Administered 2021-06-01 – 2021-06-18 (×18): 81 mg via ORAL
  Filled 2021-06-01 (×18): qty 1

## 2021-06-01 MED ORDER — PROCHLORPERAZINE EDISYLATE 10 MG/2ML IJ SOLN: 5.0000 mg | Freq: Four times a day (QID) | INTRAMUSCULAR | Status: DC | PRN

## 2021-06-01 MED ORDER — METHOCARBAMOL 500 MG PO TABS: 500.0000 mg | ORAL_TABLET | Freq: Four times a day (QID) | ORAL | Status: DC | PRN

## 2021-06-01 MED ORDER — OXYCODONE HCL 5 MG PO TABS
5.0000 mg | ORAL_TABLET | ORAL | 0 refills | Status: DC | PRN
Start: 2021-06-01 — End: 2021-06-17

## 2021-06-01 MED ORDER — INSULIN ASPART 100 UNIT/ML IJ SOLN
0.0000 [IU] | Freq: Three times a day (TID) | INTRAMUSCULAR | Status: DC
  Administered 2021-06-01 – 2021-06-02 (×2): 2 [IU] via SUBCUTANEOUS
  Administered 2021-06-02: 1 [IU] via SUBCUTANEOUS
  Administered 2021-06-02 – 2021-06-03 (×3): 2 [IU] via SUBCUTANEOUS
  Administered 2021-06-03 – 2021-06-04 (×2): 1 [IU] via SUBCUTANEOUS
  Administered 2021-06-04: 3 [IU] via SUBCUTANEOUS
  Administered 2021-06-04: 1 [IU] via SUBCUTANEOUS
  Administered 2021-06-05 (×2): 2 [IU] via SUBCUTANEOUS
  Administered 2021-06-05 – 2021-06-06 (×4): 1 [IU] via SUBCUTANEOUS
  Administered 2021-06-07 – 2021-06-08 (×3): 2 [IU] via SUBCUTANEOUS
  Administered 2021-06-08: 1 [IU] via SUBCUTANEOUS
  Administered 2021-06-08 – 2021-06-09 (×3): 2 [IU] via SUBCUTANEOUS
  Administered 2021-06-10 – 2021-06-11 (×3): 1 [IU] via SUBCUTANEOUS

## 2021-06-01 MED ORDER — PROCHLORPERAZINE MALEATE 5 MG PO TABS: 5.0000 mg | ORAL_TABLET | Freq: Four times a day (QID) | ORAL | Status: DC | PRN

## 2021-06-01 MED ORDER — ENOXAPARIN (LOVENOX) PATIENT EDUCATION KIT
PACK | Freq: Once | Status: AC
  Filled 2021-06-01: qty 1

## 2021-06-01 MED ORDER — DIPHENHYDRAMINE HCL 12.5 MG/5ML PO ELIX: 12.5000 mg | ORAL_SOLUTION | Freq: Four times a day (QID) | ORAL | Status: DC | PRN

## 2021-06-01 MED ORDER — FLEET ENEMA 7-19 GM/118ML RE ENEM: 1.0000 | ENEMA | Freq: Once | RECTAL | Status: DC | PRN

## 2021-06-01 MED ORDER — MELATONIN 3 MG PO TABS
6.0000 mg | ORAL_TABLET | Freq: Every day | ORAL | Status: DC
  Administered 2021-06-01 – 2021-06-17 (×17): 6 mg via ORAL
  Filled 2021-06-01 (×17): qty 2

## 2021-06-01 MED ORDER — BISACODYL 10 MG RE SUPP
10.0000 mg | Freq: Every day | RECTAL | Status: DC | PRN
  Administered 2021-06-01 – 2021-06-18 (×2): 10 mg via RECTAL
  Filled 2021-06-01 (×2): qty 1

## 2021-06-01 MED ORDER — AMLODIPINE BESYLATE 5 MG PO TABS
5.0000 mg | ORAL_TABLET | Freq: Every day | ORAL | Status: DC
  Administered 2021-06-02 – 2021-06-17 (×16): 5 mg via ORAL
  Filled 2021-06-01 (×16): qty 1

## 2021-06-01 MED ORDER — GUAIFENESIN-DM 100-10 MG/5ML PO SYRP: 5.0000 mL | ORAL_SOLUTION | Freq: Four times a day (QID) | ORAL | Status: DC | PRN

## 2021-06-01 MED ORDER — PROCHLORPERAZINE 25 MG RE SUPP: 12.5000 mg | Freq: Four times a day (QID) | RECTAL | Status: DC | PRN

## 2021-06-01 MED ORDER — DOCUSATE SODIUM 100 MG PO CAPS: 100.0000 mg | ORAL_CAPSULE | Freq: Two times a day (BID) | ORAL | 0 refills | Status: DC

## 2021-06-01 MED ORDER — TRAZODONE HCL 50 MG PO TABS: 25.0000 mg | ORAL_TABLET | Freq: Every evening | ORAL | Status: DC | PRN

## 2021-06-01 MED ORDER — POLYETHYLENE GLYCOL 3350 17 G PO PACK
17.0000 g | PACK | Freq: Every day | ORAL | Status: DC | PRN
  Administered 2021-06-15: 17 g via ORAL
  Filled 2021-06-01: qty 1

## 2021-06-01 MED ORDER — METHOCARBAMOL 500 MG PO TABS
500.0000 mg | ORAL_TABLET | Freq: Four times a day (QID) | ORAL | Status: DC | PRN
  Administered 2021-06-14: 500 mg via ORAL
  Filled 2021-06-01: qty 1

## 2021-06-01 MED ORDER — RIFAXIMIN 550 MG PO TABS
550.0000 mg | ORAL_TABLET | Freq: Two times a day (BID) | ORAL | Status: DC
  Administered 2021-06-01 – 2021-06-18 (×34): 550 mg via ORAL
  Filled 2021-06-01 (×34): qty 1

## 2021-06-01 MED ORDER — DOCUSATE SODIUM 100 MG PO CAPS
100.0000 mg | ORAL_CAPSULE | Freq: Two times a day (BID) | ORAL | Status: DC
  Administered 2021-06-01 – 2021-06-17 (×31): 100 mg via ORAL
  Filled 2021-06-01 (×34): qty 1

## 2021-06-01 MED ORDER — PANTOPRAZOLE SODIUM 40 MG PO TBEC
40.0000 mg | DELAYED_RELEASE_TABLET | Freq: Two times a day (BID) | ORAL | Status: DC
  Administered 2021-06-01 – 2021-06-18 (×34): 40 mg via ORAL
  Filled 2021-06-01 (×34): qty 1

## 2021-06-01 MED ORDER — POLYVINYL ALCOHOL 1.4 % OP SOLN
1.0000 [drp] | Freq: Four times a day (QID) | OPHTHALMIC | Status: DC
Start: 2021-06-01 — End: 2021-06-18
  Administered 2021-06-01 – 2021-06-18 (×61): 1 [drp] via OPHTHALMIC
  Filled 2021-06-01: qty 15

## 2021-06-01 MED ORDER — INSULIN ASPART 100 UNIT/ML IJ SOLN
0.0000 [IU] | Freq: Every day | INTRAMUSCULAR | 11 refills | Status: DC
Start: 2021-06-01 — End: 2021-06-18

## 2021-06-01 NOTE — Progress Notes (Signed)
Inpatient Rehabilitation Admission Medication Review by a Pharmacist  A complete drug regimen review was completed for this patient to identify any potential clinically significant medication issues.  High Risk Drug Classes Is patient taking? Indication by Medication  Antipsychotic Yes Compazine prn N/V  Anticoagulant No   Antibiotic No   Opioid Yes Oxycodone prn pain  Antiplatelet No   Hypoglycemics/insulin Yes SSI for DM, glucose control  Vasoactive Medication Yes Amlodipine for BP  Chemotherapy No   Other Yes Rifaximin, lactulose for hepatic enceph.  Protonix for GERD Trazodone for sleep Atorvastatin for HLD     Type of Medication Issue Identified Description of Issue Recommendation(s)  Drug Interaction(s) (clinically significant)     Duplicate Therapy     Allergy     No Medication Administration End Date     Incorrect Dose     Additional Drug Therapy Needed     Significant med changes from prior encounter (inform family/care partners about these prior to discharge).    Other       Clinically significant medication issues were identified that warrant physician communication and completion of prescribed/recommended actions by midnight of the next day:  No   Pharmacist comments: Await DVT ppx orders -- DOAC vs Lovenox  Time spent performing this drug regimen review (minutes):  30 minutes   Tad Moore 06/01/2021 1:15 PM

## 2021-06-01 NOTE — H&P (Signed)
Physical Medicine and Rehabilitation Admission H&P        Chief Complaint  Patient presents with   Functional deficits due to hip fracture      HPI:  Dalton Miller. Spruce is a 76 year old male with history of melanoma of lung with mets, T2DM,  Renal cell CA, COPD--1 PPD smoker, Right MCA infarct 07/8673 complicated by GIB secondary to esophageal varices, Covid and hepatic encephalopathy due to new diagnosis of liver cirrhosis. He was admitted on 05/27/21 after a fall with inability to stand due to severe pain. He was found to have left IT femur fracture and underwent ORIF left femur by Dr. Doreatha Martin. Post of WBAT and on Lovenox for DVT prophylaxis w/recs to transition to DOAC X 30 days at discharge.    Post op noted to have hyponatremia as well as poorly controlled BS. ABLA with drop in Hgb to 6.8 treated with one unit PRBC and thrombocytopenia being monitored. Foley was placed due to urinary retention and d/c this am for voiding trial. PT/OT has been working with patient and he was noted to be limited by pain, weakness and working on pre-gait activity. CIR recommended due to functional decline.      Pt reports very sleepy, however does not that they took his foley out before lunch and hasn't voided yet.  Also just had BM on CIR/in bathroom.  Pain 6/10 without meds- needs new ones.      Review of Systems  Unable to perform ROS: Acuity of condition          Past Medical History:  Diagnosis Date   Acquired renal cyst of right kidney     Arthritis     Cancer ([place])      renal cell    Cirrhosis ([place])     Erectile dysfunction     Heart murmur      CHILDHOOD   Hematuria      left ureteral bleeding   History of cellulitis      2012 left wrist   History of kidney stones     Hyperlipidemia     Hypertension     Metastatic melanoma to lung, left ([place])     Nephrolithiasis      bilateral non-obstructive per ct 07-02-2015   Type 2 diabetes mellitus ([place])     Wears glasses              Past Surgical History:  Procedure Laterality Date   CARPAL TUNNEL RELEASE Right 1990  approx   COLONOSCOPY   03/04/2010   CYSTOSCOPY W/ RETROGRADES Left 10/28/2015    Procedure: CYSTOSCOPY WITH LEFT RETROGRADE PYELOGRAM;  Surgeon: Cleon Gustin, MD;  Location: [facility];  Service: Urology;  Laterality: Left;   CYSTOSCOPY W/ URETERAL STENT PLACEMENT Left 10/28/2015    Procedure: CYSTOSCOPY WITH LEFT URETERAL STENT EXCHANGE;  Surgeon: Cleon Gustin, MD;  Location: [facility];  Service: Urology;  Laterality: Left;   CYSTOSCOPY WITH RETROGRADE PYELOGRAM, URETEROSCOPY AND STENT PLACEMENT Bilateral 09/17/2015    Procedure: CYSTOSCOPY WITH BILATERAL RETROGRADE PYELOGRAM, LEFT URETEROSCOPY WITH URETERAL BIOPSY AND  STENT PLACEMENT;  Surgeon: Irine Seal, MD;  Location: [facility];  Service: Urology;  Laterality: Bilateral;   ESOPHAGEAL BANDING   12/30/2020    Procedure: ESOPHAGEAL BANDING;  Surgeon: Ladene Artist, MD;  Location: [facility];  Service: Endoscopy;;   ESOPHAGOGASTRODUODENOSCOPY (EGD) WITH PROPOFOL N/A 12/30/2020    Procedure: ESOPHAGOGASTRODUODENOSCOPY (EGD) WITH PROPOFOL;  Surgeon: Lucio Edward  T, MD;  Location: [facility];  Service: Endoscopy;  Laterality: N/A;   HOLMIUM LASER APPLICATION Left 03/47/4259    Procedure: HOLMIUM LASER APPLICATION;  Surgeon: Irine Seal, MD;  Location: [facility];  Service: Urology;  Laterality: Left;   INTRAMEDULLARY (IM) NAIL INTERTROCHANTERIC Left 05/28/2021    Procedure: INTRAMEDULLARY (IM) NAIL INTERTROCHANTRIC;  Surgeon: Shona Needles, MD;  Location: [facility];  Service: Orthopedics;  Laterality: Left;   KNEE ARTHROSCOPY Right 2008   MELANOMA EXCISION   2018    Left arm   right total knee replacement  Right      2009   ROBOT ASSITED LAPAROSCOPIC NEPHROURETERECTOMY Left 12/04/2015    Procedure: XI ROBOT ASSITED LAPAROSCOPIC NEPHROURETERECTOMY;  Surgeon: Cleon Gustin, MD;  Location: [facility];  Service:  Urology;  Laterality: Left;   ROTATOR CUFF REPAIR Right     SHOULDER ARTHROSCOPY Right 12/17/2020    Procedure: Right shoulder arthroscopy, debridement, subacromial decompression, distal clavicle resection;  Surgeon: Justice Britain, MD;  Location: [facility];  Service: Orthopedics;  Laterality: Right;  49min   STONE EXTRACTION WITH BASKET Left 09/17/2015    Procedure: STONE EXTRACTION WITH BASKET;  Surgeon: Irine Seal, MD;  Location: [facility];  Service: Urology;  Laterality: Left;   TOTAL KNEE ARTHROPLASTY Right 2009   URETEROSCOPY   10/28/2015    Procedure: DIAGNOSTIC LEFT URETEROSCOPY;  Surgeon: Cleon Gustin, MD;  Location: [facility];  Service: Urology;;   WISDOM TOOTH EXTRACTION               Family History  Problem Relation Age of Onset   Diabetes Mother     Heart disease Mother     Stroke Mother     CAD Mother     Diabetes Father     Congestive Heart Failure Father     Cancer Brother     Heart disease Son     COPD Son     Emphysema Son     Heart attack Brother        Social History:  reports that he has been smoking cigars and cigarettes. He has a 63.00 pack-year smoking history. He quit smokeless tobacco use about 28 years ago.  His smokeless tobacco use included chew. He reports that he does not drink alcohol and does not use drugs.     Allergies: No Known Allergies           Medications Prior to Admission  Medication Sig Dispense Refill   acetaminophen (TYLENOL) 325 MG tablet Take 2 tablets (650 mg total) by mouth every 6 (six) hours as needed for mild pain.       amLODipine (NORVASC) 5 MG tablet Take 1 tablet (5 mg total) by mouth daily. 30 tablet 0   aspirin EC 81 MG tablet Take 1 tablet (81 mg total) by mouth daily. 90 tablet 1   atorvastatin (LIPITOR) 10 MG tablet Take 1 tablet (10 mg total) by mouth daily. 30 tablet 0   Propylene Glycol 0.6 % SOLN Place 1 drop into both eyes 4 (four) times daily.       lactulose, encephalopathy, (CHRONULAC) 10  GM/15ML SOLN Take 15 mLs (10 g total) by mouth every other day. (Patient not taking: Reported on 05/27/2021) 236 mL 0   loratadine (CLARITIN) 10 MG tablet Take 1 tablet (10 mg total) by mouth daily. (Patient not taking: Reported on 05/27/2021)       melatonin 3 MG TABS tablet Take 2 tablets (  6 mg total) by mouth at bedtime. (Patient not taking: Reported on 05/27/2021) 60 tablet 0   Multiple Vitamin (MULTIVITAMIN WITH MINERALS) TABS tablet Take 1 tablet by mouth daily. (Patient not taking: Reported on 05/27/2021)       pantoprazole (PROTONIX) 40 MG tablet Take 1 tablet (40 mg total) by mouth 2 (two) times daily. (Patient not taking: Reported on 05/27/2021) 60 tablet 0   rifaximin (XIFAXAN) 550 MG TABS tablet Take 1 tablet (550 mg total) by mouth 2 (two) times daily. (Patient not taking: Reported on 05/27/2021) 60 tablet 0          Home: Home Living Family/patient expects to be discharged to:: Private residence Living Arrangements: Children Available Help at Discharge: Family, Available 24 hours/day Type of Home: Mobile home Home Access: Level entry Home Layout: One level Bathroom Shower/Tub: Chiropodist: Standard Bathroom Accessibility: Yes Home Equipment: Cane - single point, BSC/3in1, Conservation officer, nature (2 wheels), Rollator (4 wheels), Shower seat  Lives With: Family   Functional History: Prior Function Prior Level of Function : Independent/Modified Independent Mobility Comments: Using rolling walker. At times no assitive device   Functional Status:  Mobility: Bed Mobility Overal bed mobility: Needs Assistance Bed Mobility: Rolling Rolling: Min assist Supine to sit: +2 for physical assistance, Mod assist General bed mobility comments: min A for rolling R and left for placment of dry pad Transfers Overall transfer level: Needs assistance Equipment used: Rolling walker (2 wheels), Ambulation equipment used Transfers: Sit to/from Stand, Bed to chair/wheelchair/BSC Sit  to Stand: Mod assist Bed to/from chair/wheelchair/BSC transfer type:: Stand pivot Transfer via Lift Equipment: Stedy General transfer comment: stood from recliner with RW, but unable to take steps toward [facility] Ambulation/Gait Ambulation/Gait assistance: +2 physical assistance, Min assist Gait Distance (Feet): 1 Feet Assistive device: Rolling walker (2 wheels) Gait Pattern/deviations: Step-to pattern, Decreased step length - right, Decreased step length - left, Decreased stance time - left, Decreased weight shift to left, Antalgic General Gait Details: Pt with 4 very small shuffling steps forward. Pt with limited tolerance of weight on LLE. Assist for balance and support. Gait velocity: decr Gait velocity interpretation: <1.31 ft/sec, indicative of household ambulator   ADL: ADL Overall ADL's : Needs assistance/impaired Eating/Feeding: Independent Grooming: Wash/dry hands, Wash/dry face, Set up, Supervision/safety, Sitting Upper Body Bathing: Set up, Supervision/ safety, Sitting Lower Body Bathing: Maximal assistance Upper Body Dressing : Set up, Supervision/safety, Sitting Lower Body Dressing: Maximal assistance Toilet Transfer: Moderate assistance, Cueing for safety Toilet Transfer Details (indicate cue type and reason): sit - stand from recliner attemptting to transfer to [facility], however pt unable to take steps but abe to SP Toileting- Clothing Manipulation and Hygiene: Maximal assistance Functional mobility during ADLs: Moderate assistance, Cueing for safety, Rolling walker (2 wheels)   Cognition: Cognition Overall Cognitive Status: Within Functional Limits for tasks assessed Orientation Level: Oriented X4 Cognition Arousal/Alertness: Awake/alert Behavior During Therapy: WFL for tasks assessed/performed Overall Cognitive Status: Within Functional Limits for tasks assessed     Blood pressure 126/88, pulse 85, temperature 98.9 F (37.2 C), temperature source Oral, resp. rate 18,  height 5\' 7"  (1.702 m), weight 68 kg, SpO2 99 %. Physical Exam Vitals and nursing note reviewed.  Constitutional:      Appearance: Normal appearance.     Comments: Sitting up in bed; snoozing off and on; no foley in; appears stated age, NAD  HENT:     Head: Normocephalic and atraumatic.     Comments: Mild L facial droop  Right Ear: External ear normal.     Left Ear: External ear normal.     Nose: Nose normal. No congestion.     Mouth/Throat:     Mouth: Mucous membranes are dry.     Pharynx: Oropharynx is clear. No oropharyngeal exudate.  Eyes:     General:        Right eye: No discharge.        Left eye: No discharge.     Extraocular Movements: Extraocular movements intact.  Cardiovascular:     Rate and Rhythm: Normal rate and regular rhythm.     Heart sounds: Normal heart sounds. No murmur heard.   No gallop.  Pulmonary:     Effort: Pulmonary effort is normal. No respiratory distress.     Breath sounds: Rhonchi present. No wheezing or rales.  Abdominal:     Palpations: Abdomen is soft.     Comments: NT, ND, hypoactive BS- just had BM  Musculoskeletal:        General: Swelling present.     Cervical back: Neck supple. No tenderness.     Comments: RUE 5/5 LUE 4/5 due to previous stroke RLE 5/5 LLE- HF 2/5; 4/5 in KE/DF and PF L hip moderate swelling around incision laterally on L hip  Skin:    General: Skin is warm and dry.     Comments: Multiple healed skin tears on bilateral forearms.  Also has surgical dressing on L lateral hip- moderate bruising and edema- no erythema  Neurological:     Mental Status: He is alert and oriented to person, place, and time.     Comments: Mild left facial weakness with LUE weakness. LLE limited due to pain. He was aware of being at [facility] and situation but unable to recall day, month or year.  Decreased Orientation as above   Psychiatric:        Mood and Affect: Mood normal.        Behavior: Behavior normal.      Lab Results Last 48  Hours        Results for orders placed or performed during the hospital encounter of 05/27/21 (from the past 48 hour(s))  Glucose, capillary     Status: Abnormal    Collection Time: 05/30/21  4:38 PM  Result Value Ref Range    Glucose-Capillary 165 (H) 70 - 99 mg/dL      Comment: Glucose reference range applies only to samples taken after fasting for at least 8 hours.  Glucose, capillary     Status: Abnormal    Collection Time: 05/30/21  7:18 PM  Result Value Ref Range    Glucose-Capillary 298 (H) 70 - 99 mg/dL      Comment: Glucose reference range applies only to samples taken after fasting for at least 8 hours.  CBC with Differential/Platelet     Status: Abnormal    Collection Time: 05/31/21  2:04 AM  Result Value Ref Range    WBC 6.3 4.0 - 10.5 K/uL    RBC 2.52 (L) 4.22 - 5.81 MIL/uL    Hemoglobin 7.4 (L) 13.0 - 17.0 g/dL    HCT 22.3 (L) 39.0 - 52.0 %    MCV 88.5 80.0 - 100.0 fL    MCH 29.4 26.0 - 34.0 pg    MCHC 33.2 30.0 - 36.0 g/dL    RDW 17.3 (H) 11.5 - 15.5 %    Platelets 76 (L) 150 - 400 K/uL      Comment: Immature Platelet  Fraction may be clinically indicated, consider ordering this additional test [id number]      nRBC 0.0 0.0 - 0.2 %    Neutrophils Relative % 57 %    Neutro Abs 3.6 1.7 - 7.7 K/uL    Lymphocytes Relative 28 %    Lymphs Abs 1.7 0.7 - 4.0 K/uL    Monocytes Relative 12 %    Monocytes Absolute 0.8 0.1 - 1.0 K/uL    Eosinophils Relative 1 %    Eosinophils Absolute 0.1 0.0 - 0.5 K/uL    Basophils Relative 1 %    Basophils Absolute 0.0 0.0 - 0.1 K/uL    Immature Granulocytes 1 %    Abs Immature Granulocytes 0.03 0.00 - 0.07 K/uL      Comment: Performed at [facility] [address], [place], [place] [address]  Comprehensive metabolic panel     Status: Abnormal    Collection Time: 05/31/21  2:04 AM  Result Value Ref Range    Sodium 134 (L) 135 - 145 mmol/L    Potassium 4.1 3.5 - 5.1 mmol/L    Chloride 105 98 - 111 mmol/L    CO2 23 22 - 32  mmol/L    Glucose, Bld 157 (H) 70 - 99 mg/dL      Comment: Glucose reference range applies only to samples taken after fasting for at least 8 hours.    BUN 28 (H) 8 - 23 mg/dL    Creatinine, Ser 1.17 0.61 - 1.24 mg/dL    Calcium 8.3 (L) 8.9 - 10.3 mg/dL    Total Protein 5.3 (L) 6.5 - 8.1 g/dL    Albumin 2.6 (L) 3.5 - 5.0 g/dL    AST 17 15 - 41 U/L    ALT 9 0 - 44 U/L    Alkaline Phosphatase 87 38 - 126 U/L    Total Bilirubin 0.8 0.3 - 1.2 mg/dL    GFR, Estimated >60 >60 mL/min      Comment: (NOTE) Calculated using the CKD-EPI Creatinine Equation (2021)      Anion gap 6 5 - 15      Comment: Performed at [facility], [place] [address], [place], [place] [address]  Glucose, capillary     Status: Abnormal    Collection Time: 05/31/21  7:39 AM  Result Value Ref Range    Glucose-Capillary 170 (H) 70 - 99 mg/dL      Comment: Glucose reference range applies only to samples taken after fasting for at least 8 hours.  Glucose, capillary     Status: Abnormal    Collection Time: 05/31/21 11:40 AM  Result Value Ref Range    Glucose-Capillary 161 (H) 70 - 99 mg/dL      Comment: Glucose reference range applies only to samples taken after fasting for at least 8 hours.  Glucose, capillary     Status: Abnormal    Collection Time: 05/31/21  4:35 PM  Result Value Ref Range    Glucose-Capillary 201 (H) 70 - 99 mg/dL      Comment: Glucose reference range applies only to samples taken after fasting for at least 8 hours.    Comment 1 Notify RN    Glucose, capillary     Status: Abnormal    Collection Time: 05/31/21  8:58 PM  Result Value Ref Range    Glucose-Capillary 158 (H) 70 - 99 mg/dL      Comment: Glucose reference range applies only to samples taken after fasting for  at least 8 hours.  CBC     Status: Abnormal    Collection Time: 06/01/21  4:59 AM  Result Value Ref Range    WBC 4.9 4.0 - 10.5 K/uL    RBC 2.47 (L) 4.22 - 5.81 MIL/uL    Hemoglobin 7.3 (L) 13.0 - 17.0 g/dL    HCT 21.5 (L)  39.0 - 52.0 %    MCV 87.0 80.0 - 100.0 fL    MCH 29.6 26.0 - 34.0 pg    MCHC 34.0 30.0 - 36.0 g/dL    RDW 16.7 (H) 11.5 - 15.5 %    Platelets 88 (L) 150 - 400 K/uL      Comment: Immature Platelet Fraction may be clinically indicated, consider ordering this additional test [id number] CONSISTENT WITH PREVIOUS RESULT REPEATED TO VERIFY      nRBC 0.0 0.0 - 0.2 %      Comment: Performed at [facility], [place] [address] [place], [place] [address]  Basic metabolic panel     Status: Abnormal    Collection Time: 06/01/21  4:59 AM  Result Value Ref Range    Sodium 133 (L) 135 - 145 mmol/L    Potassium 4.0 3.5 - 5.1 mmol/L    Chloride 104 98 - 111 mmol/L    CO2 22 22 - 32 mmol/L    Glucose, Bld 217 (H) 70 - 99 mg/dL      Comment: Glucose reference range applies only to samples taken after fasting for at least 8 hours.    BUN 25 (H) 8 - 23 mg/dL    Creatinine, Ser 0.91 0.61 - 1.24 mg/dL    Calcium 8.4 (L) 8.9 - 10.3 mg/dL    GFR, Estimated >60 >60 mL/min      Comment: (NOTE) Calculated using the CKD-EPI Creatinine Equation (2021)      Anion gap 7 5 - 15      Comment: Performed at [facility] [address] [place], [place] [address]  Glucose, capillary     Status: Abnormal    Collection Time: 06/01/21  7:52 AM  Result Value Ref Range    Glucose-Capillary 171 (H) 70 - 99 mg/dL      Comment: Glucose reference range applies only to samples taken after fasting for at least 8 hours.      Imaging Results (Last 48 hours)  No results found.         Blood pressure 126/88, pulse 85, temperature 98.9 F (37.2 C), temperature source Oral, resp. rate 18, height 5\' 7"  (1.702 m), weight 68 kg, SpO2 99 %.   Medical Problem List and Plan: 1. Functional deficits secondary to L hip fx in setting of previous L hemiparesis             -patient may  shower             -ELOS/Goals: 10-12 days mod I to supervision 2.  Antithrombotics: -DVT/anticoagulation:  Pharmaceutical: Lovenox  added on 5/29 but patient refusing --may transition to [place] in am if patient continues to refuse.               -antiplatelet therapy: Off ASA.  3. Pain Management: Tylenol and/or oxycodone prn.  4. Mood: LCSW to follow for evaluation and support.              -antipsychotic agents: N/A 5. Neuropsych: This patient is? capable of making decisions on his own behalf. 6. Skin/Wound Care: Surgical dressing to stay  in place for 2 weeks or till f/u with ortho             --routine pressure relief measures.  7. Fluids/Electrolytes/Nutrition: Monitor I/O. Check CMET in am.  --Add juven due to low albumin --2.6 @ admission.              --will order INR also.  8. Cirrhosis of the liver: Thrombocytopenia noted at admission 93-->72-->88. Monitor for signs of bleeding.              --has been refusing lactulose as not taking at home (runs him too much)   --Will check ammonia level in am             --Continue Rifaximin  9. Hyponatremia: Multifactorial. Will recheck in am. 10. T2DM: Hgb A1c- 6.4 and diet controlled.  --Poorly controlled post op likely due to stress and immobility.              --monitor BS ac/hs and use SSI for elevated BS             --change diet to Carb Modified.  11. Constipation: Has not had BM since admission. Has been refusing laxatives per nursing.  12. H/o melanoma/Lung CA w/mets/COPD: Encourage tobacco cessation.             --respiratory status stable. Not on MDI at home.  13. H/o GIB: continue PPI bid.  14. Insomnia: On Trazodone and melatonin.  15. HTN: Monitor BP TID--continue Norvasc daily.  16. Low grade fever: Encourage IS.  17. Urinary retention: Foley removed 05/30 am-->will monitor voiding with PVR checks.             --toilet every 4 hours.  18. L-MCA stroke: On ASA for stroke prophylaxis--resume.  19. Constipation: Had results with suppository after admission             --encouraged patient to take Lactulose for OIC.      I have personally performed a face  to face diagnostic evaluation of this patient and formulated the key components of the plan.  Additionally, I have personally reviewed laboratory data, imaging studies, as well as relevant notes and concur with the physician assistant's documentation above.   The patient's status has not changed from the original H&P.  Any changes in documentation from the acute care chart have been noted above.       Bary Leriche, PA-C 06/01/2021

## 2021-06-01 NOTE — Discharge Summary (Signed)
Physician Discharge Summary   Patient: Dalton Miller MRN: [id number] DOB: 12/11/1945  Admit date:     05/27/2021  Discharge date: 06/01/21  Discharge Physician: Ezekiel Slocumb   PCP: [facility]   Recommendations at discharge:   Follow up with Acute Inpatient Rehab Medical Team  Follow up with Dr. Doreatha Martin, orthopedic surgery Repeat Hbg/Hct within one week, follow up anemia Follow up with Primary Care within 1-2 weeks   Discharge Diagnoses: Principal Problem:   Hip fracture ([place]) Active Problems:   Diabetes mellitus ([place])   Hyperlipidemia associated with type 2 diabetes mellitus ([place])   Hypertension associated with diabetes ([place])   Vitamin D deficiency   Single kidney   COPD (chronic obstructive pulmonary disease) ([place])   Malignant neoplasm of upper lobe of left lung ([place])   Malignant melanoma ([place])   CVA (cerebral vascular accident) [facility])   Hospital Course: Dalton Miller  is a 76 y.o. male, with history of metastatic melanoma, lung CA, urothelial CA, GI bleed, CVA, with a prolonged hospitalization last December/January for acute CVA, GI bleed due to liver cirrhosis with varices, COVID-19 infection, patient recovered and he was discharged home from [place] on 01/29/2021.  He receives all of his care at [facility], he is following with oncologist over there, so far he tells me he is in remission, but I have no access to [place] records. Patient presented to ED secondary to mechanical fall and was noted to have a left hip fracture.  He was transferred from [facility] to [facility] for surgical management and has undergone IM nail to left intertrochanteric femur fracture on 5/26.  Assessment and Plan: Left hip fracture: - status post IM nail 5/26 -Continue as needed pain medications  -weightbearing as tolerated to left lower extremity.   -Evaluated by PT recommended CIR - approved -DVT ppx: per ortho DOAC x 30 days - sent on Eliquis 2.5 mg BID -REPEAT Hbg/Hct within a week or  less to monitor anemia -Encouraged to use incenctive spirometry Insicional and dressing care: Dressings left intact until follow-up and Reinforce dressings as needed Orthopedic device(s): None Showering: Keep dressing dry  Follow up with Dr. Doreatha Martin  Acute blood loss anemia: -H&H dropped from 11.1-8.8-6.8.   -Transfuse 1 unit PRBC on 5/28.  H&H is 7.4/22.3 -Continue to monitor closely.   Hepatic cirrhosis/esophageal varices -Normal liver enzymes and INR. -Continue with PPI, rifaximin and lactulose   History of CVA -Continue statin hold aspirin for now given thrombocytopenia   Diabetes mellitus type 2 -Well-controlled.  A1c 6.4%.   Covered with sliding scale Novolog - continue at CIR. Monitor blood sugar closely PCP follow up.   Hypertension -Continue amlodipine   History of lung cancer History of metastatic recurrent melanoma Urothelial CA -Is been followed by Dr. Darnell Level in [place] History of COPD: -He was counseled on cessation -Not on any inhalers at home.   On room air and stable.   Thrombocytopenia -Due to liver cirrhosis, no indication for transfusion -Continue to monitor   Insomnia: -Melatonin is not helpful.  Added trazodone at bedtime.   Urinary retention: Has Foley in place and does not want this to be out. Follow up with urology.   DVT prophylaxis: Eliquis 2.5 mg BID x 30 days per ortho  Code Status: Full code   Disposition Plan: [facility] and [facility]. Pt is medically stable for d/c to rehab.   Consultants:  Orthopedic surgery  Procedures:  Intramedullary nail intertrochanteric left         Consultants: Orthopedic surgery Procedures performed: Cephalomedullary nailing of left intertrochanteric femur fracture  Disposition: Rehabilitation facility - [facility] Diet recommendation:  Regular diet DISCHARGE MEDICATION: Allergies as of 06/01/2021   No Known Allergies       Medication List     STOP taking these medications    aspirin EC 81 MG tablet   loratadine 10 MG tablet Commonly known as: CLARITIN   melatonin 3 MG Tabs tablet       TAKE these medications    acetaminophen 325 MG tablet Commonly known as: TYLENOL Take 2 tablets (650 mg total) by mouth every 6 (six) hours as needed for mild pain.   amLODipine 5 MG tablet Commonly known as: NORVASC Take 1 tablet (5 mg total) by mouth daily.   apixaban 2.5 MG Tabs tablet Commonly known as: Eliquis Take 1 tablet (2.5 mg total) by mouth 2 (two) times daily.   atorvastatin 10 MG tablet Commonly known as: LIPITOR Take 1 tablet (10 mg total) by mouth daily.   docusate sodium 100 MG capsule Commonly known as: COLACE Take 1 capsule (100 mg total) by mouth 2 (two) times daily.   lactulose 10 GM/15ML solution Commonly known as: CHRONULAC Take 15 mLs (10 g total) by mouth every other day.   methocarbamol 500 MG tablet Commonly known as: ROBAXIN Take 1 tablet (500 mg total) by mouth every 6 (six) hours as needed for muscle spasms.   multivitamin with minerals Tabs tablet Take 1 tablet by mouth daily.   oxyCODONE 5 MG immediate release tablet Commonly known as: Oxy IR/ROXICODONE Take 1-2 tablets (5-10 mg total) by mouth every 4 (four) hours as needed for breakthrough pain ((for MODERATE breakthrough pain)).   pantoprazole 40 MG tablet Commonly known as: PROTONIX Take 1 tablet (40 mg total) by mouth 2 (two) times daily.   Propylene Glycol 0.6 % Soln Place 1 drop into both eyes 4 (four) times daily.   rifaximin 550 MG Tabs tablet Commonly known as: XIFAXAN Take 1 tablet (550 mg total) by mouth 2 (two) times daily.   traZODone 50 MG tablet Commonly known as: DESYREL Take 1 tablet (50 mg total) by mouth at bedtime.               Discharge Care Instructions  (From admission, onward)           Start     Ordered   06/01/21 0000  Leave dressing on - Keep it clean, dry,  and intact until clinic visit        06/01/21 1033            Discharge Exam: Filed Weights   05/27/21 1831 05/28/21 1737  Weight: 68 kg 68 kg   General exam: awake, alert, no acute distress HEENT: atraumatic, clear conjunctiva, anicteric sclera, moist mucus membranes, hearing grossly normal  Respiratory system: CTAB, no wheezes, rales or rhonchi, normal respiratory effort. Cardiovascular system: normal S1/S2, RRR Genitourinary system: foley in place Gastrointestinal system: soft, NT,  Central nervous system: A&O x3. no gross focal neurologic deficits, normal speech Extremities: moves all, no edema, normal tone Skin: dry, intact, normal temperature Psychiatry: normal mood, congruent affect   Condition at discharge: stable  The results of significant diagnostics from this hospitalization (including imaging, microbiology, ancillary and laboratory) are listed below for reference.   Imaging Studies: DG Knee 1-2 Views Left  Result Date: 05/27/2021 CLINICAL DATA:  Fall, left leg pain EXAM: LEFT KNEE - 1-2 VIEW COMPARISON:  None Available. FINDINGS: No evidence of fracture, dislocation, or joint effusion. No evidence of arthropathy or other focal bone abnormality. Soft tissues are unremarkable. IMPRESSION: Negative. Electronically Signed   By: Rolm Baptise M.D.   On: 05/27/2021 20:52   DG C-Arm 1-60 Min-No Report  Result Date: 05/28/2021 Fluoroscopy was utilized by the requesting physician.  No radiographic interpretation.   DG HIP UNILAT W OR W/O PELVIS 2-3 VIEWS LEFT  Result Date: 05/28/2021 CLINICAL DATA:  Provided history: Postop left hip. EXAM: DG HIP (WITH OR WITHOUT PELVIS) 2-3V LEFT COMPARISON:  Intra procedural fluoroscopic images of the left hip 05/28/2021. Radiographs of the left hip 05/27/2021. FINDINGS: Immediate postoperative changes from left femoral ORIF for intertrochanteric left femur fracture. An intramedullary nail is present within the proximal left femur. Two  interlocking screws proximally, traversing the left femoral head/neck. An additional interlocking screw is present more distally. Overlying soft tissue swelling and subcutaneous emphysema. IMPRESSION: Immediate postoperative changes from left femoral ORIF for intertrochanteric left femur fracture. Electronically Signed   By: Kellie Simmering D.O.   On: 05/28/2021 14:03   DG HIP UNILAT WITH PELVIS 2-3 VIEWS LEFT  Result Date: 05/28/2021 CLINICAL DATA:  ORIF of proximal left femur fracture. EXAM: DG HIP (WITH OR WITHOUT PELVIS) 2-3V LEFT COMPARISON:  05/27/2021 FINDINGS: Status post ORIF of intertrochanteric fracture of the proximal left fever. Images demonstrate placement of IM nail and 2 cannulated screws along the right femoral neck. IMPRESSION: Status post ORIF of intertrochanteric fracture of the proximal left femur. Electronically Signed   By: Kerby Moors M.D.   On: 05/28/2021 13:32   DG Hip Unilat With Pelvis 2-3 Views Left  Result Date: 05/27/2021 CLINICAL DATA:  fall, left hip pain EXAM: DG HIP (WITH OR WITHOUT PELVIS) 2-3V LEFT COMPARISON:  CT 01/03/2021 FINDINGS: There is an acute, displaced left intertrochanteric femur fracture. Moderate osteoarthritis of the hips. Bilateral SI joint and lower lumbar spine degenerative changes. Vascular calcifications. IMPRESSION: Acute, displaced left intertrochanteric femur fracture. Electronically Signed   By: Maurine Simmering M.D.   On: 05/27/2021 19:08    Microbiology: Results for orders placed or performed during the hospital encounter of 12/29/20  Resp Panel by RT-PCR (Flu A&B, Covid) Nasopharyngeal Swab     Status: Abnormal   Collection Time: 12/29/20  9:33 AM   Specimen: Nasopharyngeal Swab; Nasopharyngeal(NP) swabs in vial transport medium  Result Value Ref Range Status   SARS Coronavirus 2 by RT PCR POSITIVE (A) NEGATIVE Final    Comment: (NOTE) SARS-CoV-2 target nucleic acids are DETECTED.  The SARS-CoV-2 RNA is generally detectable in upper  respiratory specimens during the acute phase of infection. Positive results are indicative of the presence of the identified virus, but do not rule out bacterial infection or co-infection with other pathogens not detected by the test. Clinical correlation with patient history and other diagnostic information is necessary to determine patient infection status. The expected result is Negative.  Fact Sheet for Patients: EntrepreneurPulse.com.au  Fact Sheet for Healthcare Providers: IncredibleEmployment.be  This test is not yet approved or cleared by the [place] FDA and  has been authorized for detection and/or diagnosis of SARS-CoV-2 by FDA under an Emergency Use Authorization (EUA).  This EUA will remain in effect (meaning this test can be used) for the duration of  the COVID-19 declaration under Section 564(b)(1) of the A ct, 21 U.S.C. section 360bbb-3(b)(1), unless the authorization is terminated or revoked  sooner.     Influenza A by PCR NEGATIVE NEGATIVE Final   Influenza B by PCR NEGATIVE NEGATIVE Final    Comment: (NOTE) The Xpert Xpress SARS-CoV-2/FLU/RSV plus assay is intended as an aid in the diagnosis of influenza from Nasopharyngeal swab specimens and should not be used as a sole basis for treatment. Nasal washings and aspirates are unacceptable for Xpert Xpress SARS-CoV-2/FLU/RSV testing.  Fact Sheet for Patients: EntrepreneurPulse.com.au  Fact Sheet for Healthcare Providers: IncredibleEmployment.be  This test is not yet approved or cleared by the [place] FDA and has been authorized for detection and/or diagnosis of SARS-CoV-2 by FDA under an Emergency Use Authorization (EUA). This EUA will remain in effect (meaning this test can be used) for the duration of the COVID-19 declaration under Section 564(b)(1) of the Act, 21 U.S.C. section 360bbb-3(b)(1), unless the authorization is  terminated or revoked.  Performed at [facility], [address], [place], [place] [address]   Urine Culture     Status: None   Collection Time: 12/29/20 10:13 AM   Specimen: Urine, Catheterized  Result Value Ref Range Status   Specimen Description   Final    URINE, CATHETERIZED Performed at [facility], [address], [place], [place] [address]    Special Requests   Final    NONE Performed at [facility], [address] [place], [place] [address]    Culture   Final    NO GROWTH Performed at [facility], [place] [address] [place], [place] [address]    Report Status 12/31/2020 FINAL  Final  Culture, blood (routine x 2)     Status: None   Collection Time: 12/29/20 11:01 AM   Specimen: BLOOD RIGHT HAND  Result Value Ref Range Status   Specimen Description BLOOD RIGHT HAND  Final   Special Requests   Final    BOTTLES DRAWN AEROBIC AND ANAEROBIC Blood Culture adequate volume   Culture   Final    NO GROWTH 5 DAYS Performed at [facility], [address] [place], [place] [address]    Report Status 01/03/2021 FINAL  Final  Culture, blood (routine x 2)     Status: None   Collection Time: 12/29/20 11:01 AM   Specimen: BLOOD LEFT HAND  Result Value Ref Range Status   Specimen Description BLOOD LEFT HAND  Final   Special Requests   Final    BOTTLES DRAWN AEROBIC AND ANAEROBIC Blood Culture results may not be optimal due to an inadequate volume of blood received in culture bottles   Culture   Final    NO GROWTH 5 DAYS Performed at [facility], [address] [place], [place] [address]    Report Status 01/03/2021 FINAL  Final  MRSA Next Gen by PCR, Nasal     Status: None   Collection Time: 12/29/20 10:31 PM   Specimen: Nasal Mucosa; Nasal Swab  Result Value Ref Range Status   MRSA by PCR Next Gen NOT DETECTED NOT DETECTED Final    Comment: (NOTE) The GeneXpert MRSA Assay (FDA approved for NASAL specimens only), is one component of a comprehensive MRSA colonization  surveillance program. It is not intended to diagnose MRSA infection nor to guide or monitor treatment for MRSA infections. Test performance is not FDA approved in patients less than 59 years old. Performed at [facility], [place] [address], [place], [place] [address]     Labs: CBC: Recent Labs  Lab 05/28/21 [phone] 05/29/21 0215 05/30/21 0145 05/31/21 8416  06/01/21 0459  WBC 8.4 9.5 6.4 6.3 4.9  NEUTROABS  --   --   --  3.6  --   HGB 11.1* 8.8* 6.8* 7.4* 7.3*  HCT 34.6* 27.2* 21.0* 22.3* 21.5*  MCV 89.9 89.8 88.6 88.5 87.0  PLT 93* PLATELET CLUMPS NOTED ON SMEAR, UNABLE TO ESTIMATE 72* 76* 88*   Basic Metabolic Panel: Recent Labs  Lab 05/28/21 0341 05/29/21 0215 05/30/21 0145 05/31/21 0204 06/01/21 0459  NA 136 132* 134* 134* 133*  K 4.1 4.3 4.3 4.1 4.0  CL 109 103 104 105 104  CO2 22 20* 22 23 22   GLUCOSE 233* 185* 201* 157* 217*  BUN 20 24* 31* 28* 25*  CREATININE 0.93 1.03 1.18 1.17 0.91  CALCIUM 8.8* 8.4* 8.3* 8.3* 8.4*  MG  --  1.7  --   --   --    Liver Function Tests: Recent Labs  Lab 05/27/21 2053 05/30/21 0145 05/31/21 0204  AST 22 15 17   ALT 26 11 9   ALKPHOS 116 83 87  BILITOT 0.9 0.5 0.8  PROT 7.3 5.1* 5.3*  ALBUMIN 4.1 2.7* 2.6*   CBG: Recent Labs  Lab 05/31/21 0739 05/31/21 1140 05/31/21 1635 05/31/21 2058 06/01/21 0752  GLUCAP 170* 161* 201* 158* 171*    Discharge time spent: greater than 30 minutes.  Signed: Ezekiel Slocumb, DO [facility] 06/01/2021

## 2021-06-01 NOTE — Progress Notes (Signed)
Courtney Heys, MD  Physician Physical Medicine and Rehabilitation PMR Pre-admission     Signed Date of Service:  05/31/2021  1:44 PM  Related encounter: ED to Hosp-Admission (Discharged) from 05/27/2021 in [place] all [x]Written[x]Templated[]Copied  Added by: [x]Boyette, Vertis Kelch, RN[x]Lovorn, Jinny Blossom, MD  []Hover for details                                                                                                                                                                                                                                                                                                                                                                                                                               PMR Admission Coordinator Pre-Admission Assessment   Patient: Dalton Miller is an 76 y.o., male MRN: [id number] DOB: 1945/01/22 Height: 5' 7" (170.2 cm) Weight: 68 kg   Insurance Information HMO:     PPO:      PCP:      IPA:      80/20:      OTHER:  PRIMARY: [place] [facility]      Policy#: [id number]      Subscriber: pt CM Name: Rubin Payor      Phone#: [phone]     Fax#: [phone] Pre-Cert#: tbd on admit      Employer:  Benefits:  Phone #: [phone]     Name:  Eff. Date:  06/01/2017     Deduct: none      Out of Pocket Max: none [facility]: per [facility] 100%      SNF: 100% Outpatient: 100%     Co-Pay:  Home Health: 100%      Co-Pay:  DME: 100%     Co-Pay:  Providers: in network with [facility]  SECONDARY: [employer] Medicare      Policy#: [id number]   Financial Counselor:       Phone#:    The "Data Collection Information Summary" for patients in Inpatient Rehabilitation Facilities with attached "Privacy Act [place] Records" was provided and verbally reviewed with:  N/A   Emergency Contact Information Contact Information       Name Relation Home Work Mobile    Martian,Dawn Daughter [phone]   [phone]    Rhea, Thrun [phone]   [phone]    Stephens,dewayne Son     [phone]         Current Medical History  Patient Admitting Diagnosis: hip fracture   History of Present Illness: 76 year old male with history of metastatic melanoma, lung CA, urothelial CA, liver cirrhosis without h/o alcohol abuse, GI bleed, CVA 12/22,  DM, HTN, COPD, COVID 19 infection. Presented to [facility] on 05/27/21 secondary to fall. Slipped in bathroom. Imaging with left hip fracture. Transferred to [facility] with orthopedic consulted.    Status post IM nail on 05/28/21. Heparin for DVT prophylaxis for 30 days with close follow up of CBC for thrombocytopenia. WBAT to LLE. Continue with rifampin for hepatic cirrhosis. Hold Asa given thrombocytopenia , continue statin. SSI for DM type 2. Continue Home meds for HTN. Monitor CBC.    Patient's medical record from [facility]  has been reviewed by the rehabilitation admission coordinator and physician.   Past Medical History      Past Medical History:  Diagnosis Date   Acquired renal cyst of right kidney     Arthritis     Cancer ([place])      renal cell    Cirrhosis (HCC)     Erectile dysfunction     Heart murmur      CHILDHOOD   Hematuria      left ureteral bleeding   History of cellulitis      2012 left wrist   History of kidney stones     Hyperlipidemia     Hypertension     Metastatic melanoma to lung, left ([place])     Nephrolithiasis      bilateral non-obstructive per ct 07-02-2015   Type 2 diabetes mellitus ([place])     Wears glasses      Has the patient had major surgery during 100 days prior to admission? Yes   Family History   family history includes CAD in his mother; COPD in his son; Cancer in his brother; Congestive Heart Failure in his father; Diabetes in his father and mother; Emphysema in  his son; Heart attack in his brother; Heart disease in his mother and son; Stroke in his mother.   Current Medications   Current Facility-Administered Medications:    amLODipine (NORVASC) tablet 5 mg, 5 mg, Oral, Daily, Haddix, Thomasene Lot, MD, 5 mg at 06/01/21 0852   atorvastatin (LIPITOR) tablet 10 mg, 10 mg, Oral, Daily, Haddix, Thomasene Lot, MD, 10 mg at 06/01/21 6269   Chlorhexidine Gluconate Cloth 2 % PADS 6 each, 6 each, Topical, Daily, Haddix, Thomasene Lot, MD, 6 each at 06/01/21 0852   docusate sodium (COLACE) capsule 100  mg, 100 mg, Oral, BID, Haddix, Thomasene Lot, MD, 100 mg at 06/01/21 0851   enoxaparin (LOVENOX) injection 40 mg, 40 mg, Subcutaneous, Q24H, Pahwani, Rinka R, MD   HYDROmorphone (DILAUDID) injection 0.5 mg, 0.5 mg, Intravenous, Q2H PRN, Haddix, Thomasene Lot, MD, 0.5 mg at 05/29/21 0909   insulin aspart (novoLOG) injection 0-5 Units, 0-5 Units, Subcutaneous, QHS, Haddix, Thomasene Lot, MD, 3 Units at 05/30/21 2152   insulin aspart (novoLOG) injection 0-9 Units, 0-9 Units, Subcutaneous, TID WC, Haddix, Thomasene Lot, MD, 2 Units at 06/01/21 0849   lactulose (CHRONULAC) 10 GM/15ML solution 10 g, 10 g, Oral, QODAY, Haddix, Thomasene Lot, MD   melatonin tablet 6 mg, 6 mg, Oral, QHS, Haddix, Thomasene Lot, MD, 6 mg at 05/31/21 2215   methocarbamol (ROBAXIN) tablet 500 mg, 500 mg, Oral, Q6H PRN, 500 mg at 06/01/21 0858 **OR** methocarbamol (ROBAXIN) 500 mg in dextrose 5 % 50 mL IVPB, 500 mg, Intravenous, Q6H PRN, Haddix, Thomasene Lot, MD   multivitamin with minerals tablet 1 tablet, 1 tablet, Oral, Daily, Haddix, Thomasene Lot, MD, 1 tablet at 06/01/21 0851   oxyCODONE (Oxy IR/ROXICODONE) immediate release tablet 5-10 mg, 5-10 mg, Oral, Q4H PRN, Haddix, Thomasene Lot, MD, 10 mg at 06/01/21 0858   pantoprazole (PROTONIX) EC tablet 40 mg, 40 mg, Oral, BID, Haddix, Thomasene Lot, MD, 40 mg at 06/01/21 2542   polyvinyl alcohol (LIQUIFILM TEARS) 1.4 % ophthalmic solution 1 drop, 1 drop, Both Eyes, QID, Haddix, Thomasene Lot, MD, 1 drop at 05/31/21 2206    rifaximin (XIFAXAN) tablet 550 mg, 550 mg, Oral, BID, Haddix, Thomasene Lot, MD, 550 mg at 06/01/21 0851   traZODone (DESYREL) tablet 50 mg, 50 mg, Oral, QHS, Pahwani, Rinka R, MD, 50 mg at 05/31/21 2215   Patients Current Diet:  Diet Order                  Diet - low sodium heart healthy             Diet regular Room service appropriate? Yes; Fluid consistency: Thin  Diet effective now                       Precautions / Restrictions Precautions Precautions: Fall Restrictions Weight Bearing Restrictions: Yes LLE Weight Bearing: Weight bearing as tolerated    Has the patient had 2 or more falls or a fall with injury in the past year? Yes   Prior Activity Level Limited Community (1-2x/wk): Mod I with RW and at times no AD Recent [facility] admit 1/23 after CVA on [facility]'s team   Prior Functional Level Self Care: Did the patient need help bathing, dressing, using the toilet or eating? Independent   Indoor Mobility: Did the patient need assistance with walking from room to room (with or without device)? Independent   Stairs: Did the patient need assistance with internal or external stairs (with or without device)? Independent   Functional Cognition: Did the patient need help planning regular tasks such as shopping or remembering to take medications? Needed some help   Patient Information Are you of Hispanic, Latino/a,or Spanish origin?: A. No, not of Hispanic, Latino/a, or Spanish origin What is your race?: A. White Do you need or want an interpreter to communicate with a doctor or health care staff?: 0. No   Patient's Response To:  Health Literacy and Transportation Is the patient able to respond to health literacy and transportation needs?: Yes Health Literacy - How often do you need  to have someone help you when you read instructions, pamphlets, or other written material from your doctor or pharmacy?: Never In the past 12 months, has lack of transportation kept you from  medical appointments or from getting medications?: No In the past 12 months, has lack of transportation kept you from meetings, work, or from getting things needed for daily living?: No   Development worker, international aid / [place]: Cane - single point, BSC/3in1, Conservation officer, nature (2 wheels), Rollator (4 wheels), Shower seat   Prior Device Use: Indicate devices/aids used by the patient prior to current illness, exacerbation or injury? Walker   Current Functional Level Cognition   Overall Cognitive Status: Within Functional Limits for tasks assessed Orientation Level: Oriented X4    Extremity Assessment (includes Sensation/Coordination)   Upper Extremity Assessment: Generalized weakness, LUE deficits/detail LUE Deficits / Details: hx of L side weakness from previous CVA  Lower Extremity Assessment: Defer to PT evaluation LLE Deficits / Details: Limited by pain and residual weakness from recent CVA.     ADLs   Overall ADL's : Needs assistance/impaired Eating/Feeding: Independent Grooming: Wash/dry hands, Wash/dry face, Set up, Supervision/safety, Sitting Upper Body Bathing: Set up, Supervision/ safety, Sitting Lower Body Bathing: Maximal assistance Upper Body Dressing : Set up, Supervision/safety, Sitting Lower Body Dressing: Maximal assistance Toilet Transfer: Moderate assistance, Cueing for safety Toilet Transfer Details (indicate cue type and reason): sit - stand from recliner attemptting to transfer to [facility], however pt unable to take steps but abe to [place] and Hygiene: Maximal assistance Functional mobility during ADLs: Moderate assistance, Cueing for safety, Rolling walker (2 wheels)     Mobility   Overal bed mobility: Needs Assistance Bed Mobility: Rolling Rolling: Min assist Supine to sit: +2 for physical assistance, Mod assist General bed mobility comments: min A for rolling R and left for placment of dry pad     Transfers   Overall  transfer level: Needs assistance Equipment used: Rolling walker (2 wheels), Ambulation equipment used Transfers: Sit to/from Stand, Bed to chair/wheelchair/BSC Sit to Stand: Mod assist Bed to/from chair/wheelchair/BSC transfer type:: Stand pivot Transfer via Lift Equipment: Stedy General transfer comment: stood from recliner with RW, but unable to take steps toward [facility]     Ambulation / Gait / Stairs / Emergency planning/management officer   Ambulation/Gait Ambulation/Gait assistance: +2 physical assistance, Herbalist (Feet): 1 Feet Assistive device: Rolling walker (2 wheels) Gait Pattern/deviations: Step-to pattern, Decreased step length - right, Decreased step length - left, Decreased stance time - left, Decreased weight shift to left, Antalgic General Gait Details: Pt with 4 very small shuffling steps forward. Pt with limited tolerance of weight on LLE. Assist for balance and support. Gait velocity: decr Gait velocity interpretation: <1.31 ft/sec, indicative of household ambulator     Posture / Balance Balance Overall balance assessment: Needs assistance Sitting-balance support: No upper extremity supported, Feet supported Sitting balance-Leahy Scale: Good Standing balance support: Bilateral upper extremity supported, During functional activity, Reliant on assistive device for balance Standing balance-Leahy Scale: Poor Standing balance comment: walker and min assist for static standing     Special needs/care consideration      Previous Home Environment  Living Arrangements: Children  Lives With: Family Available Help at Discharge: Family, Available 24 hours/day Type of Home: Mobile home Home Layout: One level Home Access: Level entry Bathroom Shower/Tub: Chiropodist: Standard Bathroom Accessibility: Yes Home Care Services: No   Discharge Living Setting Plans for Discharge Living Setting:  Patient's home, Lives with (comment), Mobile Home (family) Type of  Home at Discharge: Mobile home Discharge Home Layout: One level Discharge Home Access: Level entry Discharge Bathroom Shower/Tub: Tub/shower unit Discharge Bathroom Toilet: Standard Discharge Bathroom Accessibility: Yes How Accessible: Accessible via walker Does the patient have any problems obtaining your medications?: No   Social/Family/Support Systems Patient Roles: Parent Contact Information: son Dewayne Anticipated Caregiver: family Anticipated Caregiver's Contact Information: see contacts Caregiver Availability: 24/7 Discharge Plan Discussed with Primary Caregiver: Yes Is Caregiver In Agreement with Plan?: Yes Does Caregiver/Family have Issues with Lodging/Transportation while Pt is in Rehab?: No   Goals Patient/Family Goal for Rehab: mod I to intermittent assist with PT and OT Expected length of stay: ELOS 10 to 12 days Pt/Family Agrees to Admission and willing to participate: Yes Program Orientation Provided & Reviewed with Pt/Caregiver Including Roles  & Responsibilities: Yes   Decrease burden of Care through IP rehab admission: n/a   Possible need for SNF placement upon discharge: not anticipated   Patient Condition: I have reviewed medical records from [facility] , spoken with CM, and patient. I met with patient at the bedside for inpatient rehabilitation assessment.  Patient will benefit from ongoing PT and OT, can actively participate in 3 hours of therapy a day 5 days of the week, and can make measurable gains during the admission.  Patient will also benefit from the coordinated team approach during an Inpatient Acute Rehabilitation admission.  The patient will receive intensive therapy as well as Rehabilitation physician, nursing, social worker, and care management interventions.  Due to bladder management, bowel management, safety, skin/wound care, disease management, medication administration, pain management, and patient education the patient requires 24 hour a  day rehabilitation nursing.  The patient is currently mod assist overall with mobility and basic ADLs.  Discharge setting and therapy post discharge at home with home health is anticipated.  Patient has agreed to participate in the Acute Inpatient Rehabilitation Program and will admit today.   Preadmission Screen Completed By:  Cleatrice Burke, 06/01/2021 10:36 AM ______________________________________________________________________   Discussed status with Dr. Dagoberto Ligas on 06/01/21 at 1036 and received approval for admission today.   Admission Coordinator:  Cleatrice Burke, RN, time  3382 Date 06/01/21    Assessment/Plan: Diagnosis: Does the need for close, 24 hr/day Medical supervision in concert with the patient's rehab needs make it unreasonable for this patient to be served in a less intensive setting? Yes Co-Morbidities requiring supervision/potential complications: COPD, multiple cancers; cirrhosis, thrombocytopenia; L hip fx s/p IM nail- WBAT Due to bladder management, bowel management, safety, skin/wound care, disease management, medication administration, pain management, and patient education, does the patient require 24 hr/day rehab nursing? Yes Does the patient require coordinated care of a physician, rehab nurse, PT, OT, and SLP to address physical and functional deficits in the context of the above medical diagnosis(es)? Yes Addressing deficits in the following areas: balance, endurance, locomotion, strength, transferring, bowel/bladder control, bathing, dressing, feeding, grooming, and toileting Can the patient actively participate in an intensive therapy program of at least 3 hrs of therapy 5 days a week? Yes The potential for patient to make measurable gains while on inpatient rehab is good Anticipated functional outcomes upon discharge from inpatient rehab: modified independent and supervision PT, modified independent and supervision OT, n/a SLP Estimated rehab length  of stay to reach the above functional goals is: 10-12 days Anticipated discharge destination: Home 10. Overall Rehab/Functional Prognosis: good     MD Signature:  Revision History                          Routing History            Note Details  Jan Fireman, MD File Time 06/01/2021 10:47 AM  Author Type Physician Status Signed  Last Editor Courtney Heys, MD Service Physical Medicine and [place] # [id number] Admit Date 06/01/2021  Courtney Heys, MD  Physician Physical Medicine and Rehabilitation PMR Pre-admission     Signed Date of Service:  05/31/2021  1:44 PM  Related encounter: ED to Hosp-Admission (Discharged) from 05/27/2021 in [place]      Show:Clear all [x]Written[x]Templated[]Copied  Added by: [x]Velera Lansdale, Vertis Kelch, RN[x]Lovorn, Jinny Blossom, MD  []Hover for details                                                                                                                                                                                                                                                                                                                                                                                                                               PMR Admission Coordinator Pre-Admission Assessment   Patient: Dalton Miller is an 76 y.o., male MRN: [id number] DOB: 1945-08-09 Height: 5' 7" (170.2 cm) Weight: 68 kg   Insurance Information HMO:     PPO:  PCP:      IPA:      80/20:      OTHER:  PRIMARY: [place]      Policy#: [id number]      Subscriber: pt CM Name: Rubin Payor      Phone#: [phone]     Fax#: [phone] Pre-Cert#: tbd on admit      Employer:  Benefits:  Phone #: [phone]     Name:  Eff. Date:  06/01/2017     Deduct: none      Out of Pocket Max: none CIR: per [facility] 100%      SNF: 100% Outpatient: 100%     Co-Pay:  Home Health: 100%      Co-Pay:  DME: 100%     Co-Pay:  Providers: in network with [facility]  SECONDARY: [employer] Medicare      Policy#: [id number]   Financial Counselor:       Phone#:    The "Data Collection Information Summary" for patients in Inpatient Rehabilitation Facilities with attached "Privacy Act [place] Records" was provided and verbally reviewed with: N/A   Emergency Contact Information Contact Information       Name Relation Home Work Mobile    Spoon,Dawn Daughter [phone]   [phone]    Winthrop, Shannahan [phone]   [phone]    Younker,dewayne Son     [phone]         Current Medical History  Patient Admitting Diagnosis: hip fracture   History of Present Illness: 76 year old male with history of metastatic melanoma, lung CA, urothelial CA, liver cirrhosis without h/o alcohol abuse, GI bleed, CVA 12/22,  DM, HTN, COPD, COVID 19 infection. Presented to [facility] on 05/27/21 secondary to fall. Slipped in bathroom. Imaging with left hip fracture. Transferred to [facility] with orthopedic consulted.    Status post IM nail on 05/28/21. Heparin for DVT prophylaxis for 30 days with close follow up of CBC for thrombocytopenia. WBAT to LLE. Continue with rifampin for hepatic cirrhosis. Hold Asa given thrombocytopenia , continue statin. SSI for DM type 2. Continue Home meds for HTN. Monitor CBC.    Patient's medical record from [facility]  has been reviewed by the rehabilitation admission coordinator and physician.   Past Medical History      Past Medical History:  Diagnosis Date   Acquired renal cyst of right kidney     Arthritis     Cancer ([place])      renal cell    Cirrhosis (HCC)     Erectile dysfunction     Heart murmur      CHILDHOOD   Hematuria      left ureteral bleeding   History of cellulitis      2012 left wrist   History  of kidney stones     Hyperlipidemia     Hypertension     Metastatic melanoma to lung, left ([place])     Nephrolithiasis      bilateral non-obstructive per ct 07-02-2015   Type 2 diabetes mellitus ([place])     Wears glasses      Has the patient had major surgery during 100 days prior to admission? Yes   Family History   family history includes CAD in his mother; COPD in his son; Cancer in his brother; Congestive Heart Failure in his father; Diabetes in his father and mother; Emphysema in his son; Heart attack in his brother; Heart disease in his mother and son; Stroke in his mother.  Current Medications   Current Facility-Administered Medications:    amLODipine (NORVASC) tablet 5 mg, 5 mg, Oral, Daily, Haddix, Thomasene Lot, MD, 5 mg at 06/01/21 0981   atorvastatin (LIPITOR) tablet 10 mg, 10 mg, Oral, Daily, Haddix, Thomasene Lot, MD, 10 mg at 06/01/21 1914   Chlorhexidine Gluconate Cloth 2 % PADS 6 each, 6 each, Topical, Daily, Haddix, Thomasene Lot, MD, 6 each at 06/01/21 0852   docusate sodium (COLACE) capsule 100 mg, 100 mg, Oral, BID, Haddix, Thomasene Lot, MD, 100 mg at 06/01/21 0851   enoxaparin (LOVENOX) injection 40 mg, 40 mg, Subcutaneous, Q24H, Pahwani, Rinka R, MD   HYDROmorphone (DILAUDID) injection 0.5 mg, 0.5 mg, Intravenous, Q2H PRN, Haddix, Thomasene Lot, MD, 0.5 mg at 05/29/21 0909   insulin aspart (novoLOG) injection 0-5 Units, 0-5 Units, Subcutaneous, QHS, Haddix, Thomasene Lot, MD, 3 Units at 05/30/21 2152   insulin aspart (novoLOG) injection 0-9 Units, 0-9 Units, Subcutaneous, TID WC, Haddix, Thomasene Lot, MD, 2 Units at 06/01/21 0849   lactulose (CHRONULAC) 10 GM/15ML solution 10 g, 10 g, Oral, QODAY, Haddix, Thomasene Lot, MD   melatonin tablet 6 mg, 6 mg, Oral, QHS, Haddix, Thomasene Lot, MD, 6 mg at 05/31/21 2215   methocarbamol (ROBAXIN) tablet 500 mg, 500 mg, Oral, Q6H PRN, 500 mg at 06/01/21 0858 **OR** methocarbamol (ROBAXIN) 500 mg in dextrose 5 % 50 mL IVPB, 500 mg, Intravenous, Q6H PRN, Haddix, Thomasene Lot, MD    multivitamin with minerals tablet 1 tablet, 1 tablet, Oral, Daily, Haddix, Thomasene Lot, MD, 1 tablet at 06/01/21 0851   oxyCODONE (Oxy IR/ROXICODONE) immediate release tablet 5-10 mg, 5-10 mg, Oral, Q4H PRN, Haddix, Thomasene Lot, MD, 10 mg at 06/01/21 0858   pantoprazole (PROTONIX) EC tablet 40 mg, 40 mg, Oral, BID, Haddix, Thomasene Lot, MD, 40 mg at 06/01/21 7829   polyvinyl alcohol (LIQUIFILM TEARS) 1.4 % ophthalmic solution 1 drop, 1 drop, Both Eyes, QID, Haddix, Thomasene Lot, MD, 1 drop at 05/31/21 2206   rifaximin (XIFAXAN) tablet 550 mg, 550 mg, Oral, BID, Haddix, Thomasene Lot, MD, 550 mg at 06/01/21 0851   traZODone (DESYREL) tablet 50 mg, 50 mg, Oral, QHS, Pahwani, Rinka R, MD, 50 mg at 05/31/21 2215   Patients Current Diet:  Diet Order                  Diet - low sodium heart healthy             Diet regular Room service appropriate? Yes; Fluid consistency: Thin  Diet effective now                       Precautions / Restrictions Precautions Precautions: Fall Restrictions Weight Bearing Restrictions: Yes LLE Weight Bearing: Weight bearing as tolerated    Has the patient had 2 or more falls or a fall with injury in the past year? Yes   Prior Activity Level Limited Community (1-2x/wk): Mod I with RW and at times no AD Recent [facility] admit 1/23 after CVA on [facility]'s team   Prior Functional Level Self Care: Did the patient need help bathing, dressing, using the toilet or eating? Independent   Indoor Mobility: Did the patient need assistance with walking from room to room (with or without device)? Independent   Stairs: Did the patient need assistance with internal or external stairs (with or without device)? Independent   Functional Cognition: Did the patient need help planning regular tasks such as shopping or remembering to take medications?  Needed some help   Patient Information Are you of Hispanic, Latino/a,or Spanish origin?: A. No, not of Hispanic, Latino/a, or Spanish origin What is  your race?: A. White Do you need or want an interpreter to communicate with a doctor or health care staff?: 0. No   Patient's Response To:  Health Literacy and Transportation Is the patient able to respond to health literacy and transportation needs?: Yes Health Literacy - How often do you need to have someone help you when you read instructions, pamphlets, or other written material from your doctor or pharmacy?: Never In the past 12 months, has lack of transportation kept you from medical appointments or from getting medications?: No In the past 12 months, has lack of transportation kept you from meetings, work, or from getting things needed for daily living?: No   Development worker, international aid / [place]: Cane - single point, BSC/3in1, Conservation officer, nature (2 wheels), Rollator (4 wheels), Shower seat   Prior Device Use: Indicate devices/aids used by the patient prior to current illness, exacerbation or injury? Walker   Current Functional Level Cognition   Overall Cognitive Status: Within Functional Limits for tasks assessed Orientation Level: Oriented X4    Extremity Assessment (includes Sensation/Coordination)   Upper Extremity Assessment: Generalized weakness, LUE deficits/detail LUE Deficits / Details: hx of L side weakness from previous CVA  Lower Extremity Assessment: Defer to PT evaluation LLE Deficits / Details: Limited by pain and residual weakness from recent CVA.     ADLs   Overall ADL's : Needs assistance/impaired Eating/Feeding: Independent Grooming: Wash/dry hands, Wash/dry face, Set up, Supervision/safety, Sitting Upper Body Bathing: Set up, Supervision/ safety, Sitting Lower Body Bathing: Maximal assistance Upper Body Dressing : Set up, Supervision/safety, Sitting Lower Body Dressing: Maximal assistance Toilet Transfer: Moderate assistance, Cueing for safety Toilet Transfer Details (indicate cue type and reason): sit - stand from recliner attemptting to  transfer to [facility], however pt unable to take steps but abe to [place] and Hygiene: Maximal assistance Functional mobility during ADLs: Moderate assistance, Cueing for safety, Rolling walker (2 wheels)     Mobility   Overal bed mobility: Needs Assistance Bed Mobility: Rolling Rolling: Min assist Supine to sit: +2 for physical assistance, Mod assist General bed mobility comments: min A for rolling R and left for placment of dry pad     Transfers   Overall transfer level: Needs assistance Equipment used: Rolling walker (2 wheels), Ambulation equipment used Transfers: Sit to/from Stand, Bed to chair/wheelchair/BSC Sit to Stand: Mod assist Bed to/from chair/wheelchair/BSC transfer type:: Stand pivot Transfer via Lift Equipment: Stedy General transfer comment: stood from recliner with RW, but unable to take steps toward [facility]     Ambulation / Gait / Stairs / Emergency planning/management officer   Ambulation/Gait Ambulation/Gait assistance: +2 physical assistance, Herbalist (Feet): 1 Feet Assistive device: Rolling walker (2 wheels) Gait Pattern/deviations: Step-to pattern, Decreased step length - right, Decreased step length - left, Decreased stance time - left, Decreased weight shift to left, Antalgic General Gait Details: Pt with 4 very small shuffling steps forward. Pt with limited tolerance of weight on LLE. Assist for balance and support. Gait velocity: decr Gait velocity interpretation: <1.31 ft/sec, indicative of household ambulator     Posture / Balance Balance Overall balance assessment: Needs assistance Sitting-balance support: No upper extremity supported, Feet supported Sitting balance-Leahy Scale: Good Standing balance support: Bilateral upper extremity supported, During functional activity, Reliant on assistive device for balance Standing balance-Leahy Scale: Poor  Standing balance comment: walker and min assist for static standing     Special  needs/care consideration      Previous Home Environment  Living Arrangements: Children  Lives With: Family Available Help at Discharge: Family, Available 24 hours/day Type of Home: Mobile home Home Layout: One level Home Access: Level entry Bathroom Shower/Tub: Optometrist: Yes [place]: No   Discharge Living Setting Plans for Discharge Living Setting: Patient's home, Lives with (comment), Mobile Home (family) Type of Home at Discharge: Mobile home Discharge Home Layout: One level Discharge Home Access: Level entry Discharge Bathroom Shower/Tub: Tub/shower unit Discharge Bathroom Toilet: Standard Discharge Bathroom Accessibility: Yes How Accessible: Accessible via walker Does the patient have any problems obtaining your medications?: No   Social/Family/Support Systems Patient Roles: Parent Contact Information: son Dewayne Anticipated Caregiver: family Anticipated Caregiver's Contact Information: see contacts Caregiver Availability: 24/7 Discharge Plan Discussed with Primary Caregiver: Yes Is Caregiver In Agreement with Plan?: Yes Does Caregiver/Family have Issues with Lodging/Transportation while Pt is in Rehab?: No   Goals Patient/Family Goal for Rehab: mod I to intermittent assist with PT and OT Expected length of stay: ELOS 10 to 12 days Pt/Family Agrees to Admission and willing to participate: Yes Program Orientation Provided & Reviewed with Pt/Caregiver Including Roles  & Responsibilities: Yes   Decrease burden of Care through IP rehab admission: n/a   Possible need for SNF placement upon discharge: not anticipated   Patient Condition: I have reviewed medical records from [facility] , spoken with CM, and patient. I met with patient at the bedside for inpatient rehabilitation assessment.  Patient will benefit from ongoing PT and OT, can actively participate in 3 hours of therapy a day 5 days of  the week, and can make measurable gains during the admission.  Patient will also benefit from the coordinated team approach during an Inpatient Acute Rehabilitation admission.  The patient will receive intensive therapy as well as Rehabilitation physician, nursing, social worker, and care management interventions.  Due to bladder management, bowel management, safety, skin/wound care, disease management, medication administration, pain management, and patient education the patient requires 24 hour a day rehabilitation nursing.  The patient is currently mod assist overall with mobility and basic ADLs.  Discharge setting and therapy post discharge at home with home health is anticipated.  Patient has agreed to participate in the Acute Inpatient Rehabilitation Program and will admit today.   Preadmission Screen Completed By:  Cleatrice Burke, 06/01/2021 10:36 AM ______________________________________________________________________   Discussed status with Dr. Dagoberto Ligas on 06/01/21 at 1036 and received approval for admission today.   Admission Coordinator:  Cleatrice Burke, RN, time  2244 Date 06/01/21    Assessment/Plan: Diagnosis: Does the need for close, 24 hr/day Medical supervision in concert with the patient's rehab needs make it unreasonable for this patient to be served in a less intensive setting? Yes Co-Morbidities requiring supervision/potential complications: COPD, multiple cancers; cirrhosis, thrombocytopenia; L hip fx s/p IM nail- WBAT Due to bladder management, bowel management, safety, skin/wound care, disease management, medication administration, pain management, and patient education, does the patient require 24 hr/day rehab nursing? Yes Does the patient require coordinated care of a physician, rehab nurse, PT, OT, and SLP to address physical and functional deficits in the context of the above medical diagnosis(es)? Yes Addressing deficits in the following areas: balance,  endurance, locomotion, strength, transferring, bowel/bladder control, bathing, dressing, feeding, grooming, and toileting Can the patient actively participate in an  intensive therapy program of at least 3 hrs of therapy 5 days a week? Yes The potential for patient to make measurable gains while on inpatient rehab is good Anticipated functional outcomes upon discharge from inpatient rehab: modified independent and supervision PT, modified independent and supervision OT, n/a SLP Estimated rehab length of stay to reach the above functional goals is: 10-12 days Anticipated discharge destination: Home 10. Overall Rehab/Functional Prognosis: good     MD Signature:           Revision History                          Routing History            Note Details  Jan Fireman, MD File Time 06/01/2021 10:47 AM  Author Type Physician Status Signed  Last Editor Courtney Heys, MD Service Physical Medicine and [place] # [id number] Admit Date 06/01/2021

## 2021-06-01 NOTE — Progress Notes (Signed)
Physical Therapy Treatment Patient Details Name: Dalton Miller MRN: [id number] DOB: 09/16/1945 Today's Date: 06/01/2021   History of Present Illness Pt adm 5/25 with lt intertrochanteric femur fx after a fall. Underwent ORIF on 5/26. PMH - rt CVA, OA, shoulder surgery, renal cell cancer s/p nephrectomy, melanoma with lung mets and urothelial cancer,  HTN, DMII, COPD, R TKA,    PT Comments    Pt is relieved to be able to go to CIR to progress his mobility to be able to go fishing and camping this summer. Pt requires modAx2 for bed mobility, and maxAx2 for transfers to and from recliner. Pt is happy with his progress today. D/c plan remains appropriate.   Recommendations for follow up therapy are one component of a multi-disciplinary discharge planning process, led by the attending physician.  Recommendations may be updated based on patient status, additional functional criteria and insurance authorization.  Follow Up Recommendations  Acute inpatient rehab (3hours/day)     Assistance Recommended at Discharge Intermittent Supervision/Assistance  Patient can return home with the following A lot of help with walking and/or transfers;A lot of help with bathing/dressing/bathroom;Assistance with cooking/housework;Assist for transportation;Help with stairs or ramp for entrance   Equipment Recommendations  None recommended by PT    Recommendations for Other Services Rehab consult     Precautions / Restrictions Precautions Precautions: Fall Restrictions Weight Bearing Restrictions: Yes LLE Weight Bearing: Weight bearing as tolerated     Mobility  Bed Mobility Overal bed mobility: Needs Assistance Bed Mobility: Supine to Sit, Sit to Supine     Supine to sit: +2 for physical assistance, Mod assist, Min assist Sit to supine: Min assist   General bed mobility comments: minA and maximal cuing for managment of LE across bed and modA to bring trunk to upright due to increased L UE weakness,  min A for L LE mangament back to bed    Transfers Overall transfer level: Needs assistance Equipment used: Rolling walker (2 wheels), Ambulation equipment used Transfers: Sit to/from Stand, Bed to chair/wheelchair/BSC Sit to Stand: Max assist, +2 physical assistance   Step pivot transfers: Max assist, +2 physical assistance       General transfer comment: maxAx2 for power up, requires increased assist and cuing for bringing CoG over BoS due to increased posterior lean, maxAx2 for advancing stepping, requiring PT providing assist to keep L knee from buckling with weightbearking. Once up in chair RN in room asking for pt to return to bed for discharge to AIR. Pt is maxAx2 for pivoting back to bed, good weight shift for pivot        Balance Overall balance assessment: Needs assistance Sitting-balance support: No upper extremity supported, Feet supported Sitting balance-Leahy Scale: Good     Standing balance support: Bilateral upper extremity supported, During functional activity, Reliant on assistive device for balance Standing balance-Leahy Scale: Poor Standing balance comment: walker and min assist for static standing                            Cognition Arousal/Alertness: Awake/alert Behavior During Therapy: WFL for tasks assessed/performed Overall Cognitive Status: Within Functional Limits for tasks assessed                                          Exercises General Exercises - Lower Extremity Ankle Circles/Pumps: AROM, Both, 10  reps, Supine Heel Slides: AAROM, Left, 10 reps, Supine Hip ABduction/ADduction: AAROM, Left, 5 reps, Supine    General Comments General comments (skin integrity, edema, etc.): VSS on RA      Pertinent Vitals/Pain Pain Assessment Pain Assessment: Faces Faces Pain Scale: Hurts even more Breathing: normal Negative Vocalization: none Facial Expression: smiling or inexpressive Body Language: relaxed Consolability:  no need to console PAINAD Score: 0 Pain Location: lt hip Pain Descriptors / Indicators: Aching, Sore, Throbbing Pain Intervention(s): Limited activity within patient's tolerance, Monitored during session, Repositioned     PT Goals (current goals can now be found in the care plan section) Acute Rehab PT Goals Patient Stated Goal: return home PT Goal Formulation: With patient Time For Goal Achievement: 06/12/21 Potential to Achieve Goals: Good Progress towards PT goals: Progressing toward goals    Frequency    Min 3X/week      PT Plan Current plan remains appropriate       AM-PAC PT "6 Clicks" Mobility   Outcome Measure  Help needed turning from your back to your side while in a flat bed without using bedrails?: A Lot Help needed moving from lying on your back to sitting on the side of a flat bed without using bedrails?: Total Help needed moving to and from a bed to a chair (including a wheelchair)?: Total Help needed standing up from a chair using your arms (e.g., wheelchair or bedside chair)?: Total Help needed to walk in hospital room?: Total Help needed climbing 3-5 steps with a railing? : Total 6 Click Score: 7    End of Session Equipment Utilized During Treatment: Gait belt Activity Tolerance: Patient tolerated treatment well Patient left: with call bell/phone within reach;in bed;with bed alarm set;with nursing/sitter in room Nurse Communication: Mobility status PT Visit Diagnosis: Other abnormalities of gait and mobility (R26.89);History of falling (Z91.81);Pain Pain - Right/Left: Left Pain - part of body: Hip     Time: 1517-6160 PT Time Calculation (min) (ACUTE ONLY): 25 min  Charges:  $Therapeutic Exercise: 8-22 mins $Therapeutic Activity: 8-22 mins                     Sergei Delo B. Migdalia Dk PT, DPT Acute Rehabilitation Services Please use secure chat or  Call Office [phone]    [place] 06/01/2021, 4:32 PM

## 2021-06-01 NOTE — H&P (Signed)
Physical Medicine and Rehabilitation Admission H&P    Chief Complaint  Patient presents with   Functional deficits due to hip fracture    HPI:  Dalton Miller. Dalton Miller is a 76 year old male with history of melanoma of lung with mets, T2DM,  Renal cell CA, COPD--1 PPD smoker, Right MCA infarct 56/9794 complicated by GIB secondary to esophageal varices, Covid and hepatic encephalopathy due to new diagnosis of liver cirrhosis. He was admitted on 05/27/21 after a fall with inability to stand due to severe pain. He was found to have left IT femur fracture and underwent ORIF left femur by Dr. Doreatha Martin. Post of WBAT and on Lovenox for DVT prophylaxis w/recs to transition to DOAC X 30 days at discharge.   Post op noted to have hyponatremia as well as poorly controlled BS. ABLA with drop in Hgb to 6.8 treated with one unit PRBC and thrombocytopenia being monitored. Foley was placed due to urinary retention and d/c this am for voiding trial. PT/OT has been working with patient and he was noted to be limited by pain, weakness and working on pre-gait activity. CIR recommended due to functional decline.     Pt reports very sleepy, however does not that they took his foley out before lunch and hasn't voided yet.  Also just had BM on CIR/in bathroom.  Pain 6/10 without meds- needs new ones.    Review of Systems  Unable to perform ROS: Acuity of condition    Past Medical History:  Diagnosis Date   Acquired renal cyst of right kidney    Arthritis    Cancer ([place])    renal cell    Cirrhosis ([place])    Erectile dysfunction    Heart murmur    CHILDHOOD   Hematuria    left ureteral bleeding   History of cellulitis    2012 left wrist   History of kidney stones    Hyperlipidemia    Hypertension    Metastatic melanoma to lung, left ([place])    Nephrolithiasis    bilateral non-obstructive per ct 07-02-2015   Type 2 diabetes mellitus ([place])    Wears glasses     Past Surgical History:  Procedure Laterality  Date   CARPAL TUNNEL RELEASE Right 1990  approx   COLONOSCOPY  03/04/2010   CYSTOSCOPY W/ RETROGRADES Left 10/28/2015   Procedure: CYSTOSCOPY WITH LEFT RETROGRADE PYELOGRAM;  Surgeon: Cleon Gustin, MD;  Location: [facility];  Service: Urology;  Laterality: Left;   CYSTOSCOPY W/ URETERAL STENT PLACEMENT Left 10/28/2015   Procedure: CYSTOSCOPY WITH LEFT URETERAL STENT EXCHANGE;  Surgeon: Cleon Gustin, MD;  Location: [facility];  Service: Urology;  Laterality: Left;   CYSTOSCOPY WITH RETROGRADE PYELOGRAM, URETEROSCOPY AND STENT PLACEMENT Bilateral 09/17/2015   Procedure: CYSTOSCOPY WITH BILATERAL RETROGRADE PYELOGRAM, LEFT URETEROSCOPY WITH URETERAL BIOPSY AND  STENT PLACEMENT;  Surgeon: Irine Seal, MD;  Location: [facility];  Service: Urology;  Laterality: Bilateral;   ESOPHAGEAL BANDING  12/30/2020   Procedure: ESOPHAGEAL BANDING;  Surgeon: Ladene Artist, MD;  Location: [facility];  Service: Endoscopy;;   ESOPHAGOGASTRODUODENOSCOPY (EGD) WITH PROPOFOL N/A 12/30/2020   Procedure: ESOPHAGOGASTRODUODENOSCOPY (EGD) WITH PROPOFOL;  Surgeon: Ladene Artist, MD;  Location: [facility];  Service: Endoscopy;  Laterality: N/A;   HOLMIUM LASER APPLICATION Left 80/16/5537   Procedure: HOLMIUM LASER APPLICATION;  Surgeon: Irine Seal, MD;  Location: [facility];  Service: Urology;  Laterality: Left;   INTRAMEDULLARY (IM) NAIL INTERTROCHANTERIC Left 05/28/2021  Procedure: INTRAMEDULLARY (IM) NAIL INTERTROCHANTRIC;  Surgeon: Shona Needles, MD;  Location: [facility];  Service: Orthopedics;  Laterality: Left;   KNEE ARTHROSCOPY Right 2008   MELANOMA EXCISION  2018   Left arm   right total knee replacement  Right    2009   ROBOT ASSITED LAPAROSCOPIC NEPHROURETERECTOMY Left 12/04/2015   Procedure: XI ROBOT ASSITED LAPAROSCOPIC NEPHROURETERECTOMY;  Surgeon: Cleon Gustin, MD;  Location: [facility];  Service: Urology;  Laterality: Left;   ROTATOR CUFF REPAIR Right     SHOULDER ARTHROSCOPY Right 12/17/2020   Procedure: Right shoulder arthroscopy, debridement, subacromial decompression, distal clavicle resection;  Surgeon: Justice Britain, MD;  Location: [facility];  Service: Orthopedics;  Laterality: Right;  22min   STONE EXTRACTION WITH BASKET Left 09/17/2015   Procedure: STONE EXTRACTION WITH BASKET;  Surgeon: Irine Seal, MD;  Location: [facility];  Service: Urology;  Laterality: Left;   TOTAL KNEE ARTHROPLASTY Right 2009   URETEROSCOPY  10/28/2015   Procedure: DIAGNOSTIC LEFT URETEROSCOPY;  Surgeon: Cleon Gustin, MD;  Location: [facility];  Service: Urology;;   WISDOM TOOTH EXTRACTION      Family History  Problem Relation Age of Onset   Diabetes Mother    Heart disease Mother    Stroke Mother    CAD Mother    Diabetes Father    Congestive Heart Failure Father    Cancer Brother    Heart disease Son    COPD Son    Emphysema Son    Heart attack Brother     Social History:  reports that he has been smoking cigars and cigarettes. He has a 63.00 pack-year smoking history. He quit smokeless tobacco use about 28 years ago.  His smokeless tobacco use included chew. He reports that he does not drink alcohol and does not use drugs.   Allergies: No Known Allergies   Medications Prior to Admission  Medication Sig Dispense Refill   acetaminophen (TYLENOL) 325 MG tablet Take 2 tablets (650 mg total) by mouth every 6 (six) hours as needed for mild pain.     amLODipine (NORVASC) 5 MG tablet Take 1 tablet (5 mg total) by mouth daily. 30 tablet 0   aspirin EC 81 MG tablet Take 1 tablet (81 mg total) by mouth daily. 90 tablet 1   atorvastatin (LIPITOR) 10 MG tablet Take 1 tablet (10 mg total) by mouth daily. 30 tablet 0   Propylene Glycol 0.6 % SOLN Place 1 drop into both eyes 4 (four) times daily.     lactulose, encephalopathy, (CHRONULAC) 10 GM/15ML SOLN Take 15 mLs (10 g total) by mouth every other day. (Patient not taking: Reported on  05/27/2021) 236 mL 0   loratadine (CLARITIN) 10 MG tablet Take 1 tablet (10 mg total) by mouth daily. (Patient not taking: Reported on 05/27/2021)     melatonin 3 MG TABS tablet Take 2 tablets (6 mg total) by mouth at bedtime. (Patient not taking: Reported on 05/27/2021) 60 tablet 0   Multiple Vitamin (MULTIVITAMIN WITH MINERALS) TABS tablet Take 1 tablet by mouth daily. (Patient not taking: Reported on 05/27/2021)     pantoprazole (PROTONIX) 40 MG tablet Take 1 tablet (40 mg total) by mouth 2 (two) times daily. (Patient not taking: Reported on 05/27/2021) 60 tablet 0   rifaximin (XIFAXAN) 550 MG TABS tablet Take 1 tablet (550 mg total) by mouth 2 (two) times daily. (Patient not taking: Reported on 05/27/2021) 60 tablet 0      Home:  Home Living Family/patient expects to be discharged to:: Private residence Living Arrangements: Children Available Help at Discharge: Family, Available 24 hours/day Type of Home: Mobile home Home Access: Level entry Home Layout: One level Bathroom Shower/Tub: Chiropodist: Standard Bathroom Accessibility: Yes Home Equipment: Cane - single point, BSC/3in1, Conservation officer, nature (2 wheels), Rollator (4 wheels), Shower seat  Lives With: Family   Functional History: Prior Function Prior Level of Function : Independent/Modified Independent Mobility Comments: Using rolling walker. At times no assitive device  Functional Status:  Mobility: Bed Mobility Overal bed mobility: Needs Assistance Bed Mobility: Rolling Rolling: Min assist Supine to sit: +2 for physical assistance, Mod assist General bed mobility comments: min A for rolling R and left for placment of dry pad Transfers Overall transfer level: Needs assistance Equipment used: Rolling walker (2 wheels), Ambulation equipment used Transfers: Sit to/from Stand, Bed to chair/wheelchair/BSC Sit to Stand: Mod assist Bed to/from chair/wheelchair/BSC transfer type:: Stand pivot Transfer via Lift  Equipment: Stedy General transfer comment: stood from recliner with RW, but unable to take steps toward [facility] Ambulation/Gait Ambulation/Gait assistance: +2 physical assistance, Min assist Gait Distance (Feet): 1 Feet Assistive device: Rolling walker (2 wheels) Gait Pattern/deviations: Step-to pattern, Decreased step length - right, Decreased step length - left, Decreased stance time - left, Decreased weight shift to left, Antalgic General Gait Details: Pt with 4 very small shuffling steps forward. Pt with limited tolerance of weight on LLE. Assist for balance and support. Gait velocity: decr Gait velocity interpretation: <1.31 ft/sec, indicative of household ambulator    ADL: ADL Overall ADL's : Needs assistance/impaired Eating/Feeding: Independent Grooming: Wash/dry hands, Wash/dry face, Set up, Supervision/safety, Sitting Upper Body Bathing: Set up, Supervision/ safety, Sitting Lower Body Bathing: Maximal assistance Upper Body Dressing : Set up, Supervision/safety, Sitting Lower Body Dressing: Maximal assistance Toilet Transfer: Moderate assistance, Cueing for safety Toilet Transfer Details (indicate cue type and reason): sit - stand from recliner attemptting to transfer to [facility], however pt unable to take steps but abe to SP Toileting- Clothing Manipulation and Hygiene: Maximal assistance Functional mobility during ADLs: Moderate assistance, Cueing for safety, Rolling walker (2 wheels)  Cognition: Cognition Overall Cognitive Status: Within Functional Limits for tasks assessed Orientation Level: Oriented X4 Cognition Arousal/Alertness: Awake/alert Behavior During Therapy: WFL for tasks assessed/performed Overall Cognitive Status: Within Functional Limits for tasks assessed   Blood pressure 126/88, pulse 85, temperature 98.9 F (37.2 C), temperature source Oral, resp. rate 18, height 5\' 7"  (1.702 m), weight 68 kg, SpO2 99 %. Physical Exam Vitals and nursing note reviewed.   Constitutional:      Appearance: Normal appearance.     Comments: Sitting up in bed; snoozing off and on; no foley in; appears stated age, NAD  HENT:     Head: Normocephalic and atraumatic.     Comments: Mild L facial droop    Right Ear: External ear normal.     Left Ear: External ear normal.     Nose: Nose normal. No congestion.     Mouth/Throat:     Mouth: Mucous membranes are dry.     Pharynx: Oropharynx is clear. No oropharyngeal exudate.  Eyes:     General:        Right eye: No discharge.        Left eye: No discharge.     Extraocular Movements: Extraocular movements intact.  Cardiovascular:     Rate and Rhythm: Normal rate and regular rhythm.     Heart sounds: Normal heart sounds.  No murmur heard.   No gallop.  Pulmonary:     Effort: Pulmonary effort is normal. No respiratory distress.     Breath sounds: Rhonchi present. No wheezing or rales.  Abdominal:     Palpations: Abdomen is soft.     Comments: NT, ND, hypoactive BS- just had BM  Musculoskeletal:        General: Swelling present.     Cervical back: Neck supple. No tenderness.     Comments: RUE 5/5 LUE 4/5 due to previous stroke RLE 5/5 LLE- HF 2/5; 4/5 in KE/DF and PF L hip moderate swelling around incision laterally on L hip  Skin:    General: Skin is warm and dry.     Comments: Multiple healed skin tears on bilateral forearms.  Also has surgical dressing on L lateral hip- moderate bruising and edema- no erythema  Neurological:     Mental Status: He is alert and oriented to person, place, and time.     Comments: Mild left facial weakness with LUE weakness. LLE limited due to pain. He was aware of being at [facility] and situation but unable to recall day, month or year.  Decreased Orientation as above   Psychiatric:        Mood and Affect: Mood normal.        Behavior: Behavior normal.    Results for orders placed or performed during the hospital encounter of 05/27/21 (from the past 48 hour(s))  Glucose,  capillary     Status: Abnormal   Collection Time: 05/30/21  4:38 PM  Result Value Ref Range   Glucose-Capillary 165 (H) 70 - 99 mg/dL    Comment: Glucose reference range applies only to samples taken after fasting for at least 8 hours.  Glucose, capillary     Status: Abnormal   Collection Time: 05/30/21  7:18 PM  Result Value Ref Range   Glucose-Capillary 298 (H) 70 - 99 mg/dL    Comment: Glucose reference range applies only to samples taken after fasting for at least 8 hours.  CBC with Differential/Platelet     Status: Abnormal   Collection Time: 05/31/21  2:04 AM  Result Value Ref Range   WBC 6.3 4.0 - 10.5 K/uL   RBC 2.52 (L) 4.22 - 5.81 MIL/uL   Hemoglobin 7.4 (L) 13.0 - 17.0 g/dL   HCT 22.3 (L) 39.0 - 52.0 %   MCV 88.5 80.0 - 100.0 fL   MCH 29.4 26.0 - 34.0 pg   MCHC 33.2 30.0 - 36.0 g/dL   RDW 17.3 (H) 11.5 - 15.5 %   Platelets 76 (L) 150 - 400 K/uL    Comment: Immature Platelet Fraction may be clinically indicated, consider ordering this additional test [id number]    nRBC 0.0 0.0 - 0.2 %   Neutrophils Relative % 57 %   Neutro Abs 3.6 1.7 - 7.7 K/uL   Lymphocytes Relative 28 %   Lymphs Abs 1.7 0.7 - 4.0 K/uL   Monocytes Relative 12 %   Monocytes Absolute 0.8 0.1 - 1.0 K/uL   Eosinophils Relative 1 %   Eosinophils Absolute 0.1 0.0 - 0.5 K/uL   Basophils Relative 1 %   Basophils Absolute 0.0 0.0 - 0.1 K/uL   Immature Granulocytes 1 %   Abs Immature Granulocytes 0.03 0.00 - 0.07 K/uL    Comment: Performed at [facility], [address] [place], [place] [address]  Comprehensive metabolic panel     Status: Abnormal   Collection Time:  05/31/21  2:04 AM  Result Value Ref Range   Sodium 134 (L) 135 - 145 mmol/L   Potassium 4.1 3.5 - 5.1 mmol/L   Chloride 105 98 - 111 mmol/L   CO2 23 22 - 32 mmol/L   Glucose, Bld 157 (H) 70 - 99 mg/dL    Comment: Glucose reference range applies only to samples taken after fasting for at least 8 hours.   BUN 28 (H) 8 - 23 mg/dL    Creatinine, Ser 1.17 0.61 - 1.24 mg/dL   Calcium 8.3 (L) 8.9 - 10.3 mg/dL   Total Protein 5.3 (L) 6.5 - 8.1 g/dL   Albumin 2.6 (L) 3.5 - 5.0 g/dL   AST 17 15 - 41 U/L   ALT 9 0 - 44 U/L   Alkaline Phosphatase 87 38 - 126 U/L   Total Bilirubin 0.8 0.3 - 1.2 mg/dL   GFR, Estimated >60 >60 mL/min    Comment: (NOTE) Calculated using the CKD-EPI Creatinine Equation (2021)    Anion gap 6 5 - 15    Comment: Performed at [facility], [place] [address] [place], [place] [address]  Glucose, capillary     Status: Abnormal   Collection Time: 05/31/21  7:39 AM  Result Value Ref Range   Glucose-Capillary 170 (H) 70 - 99 mg/dL    Comment: Glucose reference range applies only to samples taken after fasting for at least 8 hours.  Glucose, capillary     Status: Abnormal   Collection Time: 05/31/21 11:40 AM  Result Value Ref Range   Glucose-Capillary 161 (H) 70 - 99 mg/dL    Comment: Glucose reference range applies only to samples taken after fasting for at least 8 hours.  Glucose, capillary     Status: Abnormal   Collection Time: 05/31/21  4:35 PM  Result Value Ref Range   Glucose-Capillary 201 (H) 70 - 99 mg/dL    Comment: Glucose reference range applies only to samples taken after fasting for at least 8 hours.   Comment 1 Notify RN   Glucose, capillary     Status: Abnormal   Collection Time: 05/31/21  8:58 PM  Result Value Ref Range   Glucose-Capillary 158 (H) 70 - 99 mg/dL    Comment: Glucose reference range applies only to samples taken after fasting for at least 8 hours.  CBC     Status: Abnormal   Collection Time: 06/01/21  4:59 AM  Result Value Ref Range   WBC 4.9 4.0 - 10.5 K/uL   RBC 2.47 (L) 4.22 - 5.81 MIL/uL   Hemoglobin 7.3 (L) 13.0 - 17.0 g/dL   HCT 21.5 (L) 39.0 - 52.0 %   MCV 87.0 80.0 - 100.0 fL   MCH 29.6 26.0 - 34.0 pg   MCHC 34.0 30.0 - 36.0 g/dL   RDW 16.7 (H) 11.5 - 15.5 %   Platelets 88 (L) 150 - 400 K/uL    Comment: Immature Platelet Fraction may  be clinically indicated, consider ordering this additional test [id number] CONSISTENT WITH PREVIOUS RESULT REPEATED TO VERIFY    nRBC 0.0 0.0 - 0.2 %    Comment: Performed at [facility], [place] [address] [place], [place] [address]  Basic metabolic panel     Status: Abnormal   Collection Time: 06/01/21  4:59 AM  Result Value Ref Range   Sodium 133 (L) 135 - 145 mmol/L   Potassium 4.0 3.5 - 5.1 mmol/L   Chloride 104 98 - 111 mmol/L  CO2 22 22 - 32 mmol/L   Glucose, Bld 217 (H) 70 - 99 mg/dL    Comment: Glucose reference range applies only to samples taken after fasting for at least 8 hours.   BUN 25 (H) 8 - 23 mg/dL   Creatinine, Ser 0.91 0.61 - 1.24 mg/dL   Calcium 8.4 (L) 8.9 - 10.3 mg/dL   GFR, Estimated >60 >60 mL/min    Comment: (NOTE) Calculated using the CKD-EPI Creatinine Equation (2021)    Anion gap 7 5 - 15    Comment: Performed at [facility] [address], [place], [place] [address]  Glucose, capillary     Status: Abnormal   Collection Time: 06/01/21  7:52 AM  Result Value Ref Range   Glucose-Capillary 171 (H) 70 - 99 mg/dL    Comment: Glucose reference range applies only to samples taken after fasting for at least 8 hours.   No results found.    Blood pressure 126/88, pulse 85, temperature 98.9 F (37.2 C), temperature source Oral, resp. rate 18, height 5\' 7"  (1.702 m), weight 68 kg, SpO2 99 %.  Medical Problem List and Plan: 1. Functional deficits secondary to L hip fx in setting of previous L hemiparesis  -patient may  shower  -ELOS/Goals: 10-12 days mod I to supervision 2.  Antithrombotics: -DVT/anticoagulation:  Pharmaceutical: Lovenox added on 5/29 but patient refusing --may transition to [place] in am if patient continues to refuse.    -antiplatelet therapy: Off ASA.  3. Pain Management: Tylenol and/or oxycodone prn.  4. Mood: LCSW to follow for evaluation and support.   -antipsychotic agents: N/A 5. Neuropsych: This patient is? capable  of making decisions on his own behalf. 6. Skin/Wound Care: Surgical dressing to stay in place for 2 weeks or till f/u with ortho  --routine pressure relief measures.  7. Fluids/Electrolytes/Nutrition: Monitor I/O. Check CMET in am.  --Add juven due to low albumin --2.6 @ admission.   --will order INR also.  8. Cirrhosis of the liver: Thrombocytopenia noted at admission 93-->72-->88. Monitor for signs of bleeding.   --has been refusing lactulose as not taking at home (runs him too much)   --Will check ammonia level in am  --Continue Rifaximin  9. Hyponatremia: Multifactorial. Will recheck in am. 10. T2DM: Hgb A1c- 6.4 and diet controlled.  --Poorly controlled post op likely due to stress and immobility.   --monitor BS ac/hs and use SSI for elevated BS  --change diet to Carb Modified.  11. Constipation: Has not had BM since admission. Has been refusing laxatives per nursing.  12. H/o melanoma/Lung CA w/mets/COPD: Encourage tobacco cessation.  --respiratory status stable. Not on MDI at home.  13. H/o GIB: continue PPI bid.  14. Insomnia: On Trazodone and melatonin.  15. HTN: Monitor BP TID--continue Norvasc daily.  16. Low grade fever: Encourage IS.  17. Urinary retention: Foley removed 05/30 am-->will monitor voiding with PVR checks.  --toilet every 4 hours.  18. L-MCA stroke: On ASA for stroke prophylaxis--resume.  19. Constipation: Had results with suppository after admission  --encouraged patient to take Lactulose for OIC.    I have personally performed a face to face diagnostic evaluation of this patient and formulated the key components of the plan.  Additionally, I have personally reviewed laboratory data, imaging studies, as well as relevant notes and concur with the physician assistant's documentation above.   The patient's status has not changed from the original H&P.  Any changes in documentation from the acute care  chart have been noted above.     Bary Leriche,  PA-C 06/01/2021

## 2021-06-01 NOTE — Progress Notes (Signed)
Inpatient Rehabilitation Admissions Coordinator   I have [place] approval to admit to [facility] today. I met with patient at bedside as well as Dr Arbutus Ped and will make the arrangements to admit. Acute team and TOC made aware.  Danne Baxter, RN, MSN Rehab Admissions Coordinator [phone] 06/01/2021 10:25 AM

## 2021-06-01 NOTE — Anesthesia Postprocedure Evaluation (Signed)
Anesthesia Post Note  Patient: Jeris Roser Beldin  Procedure(s) Performed: INTRAMEDULLARY (IM) NAIL INTERTROCHANTRIC (Left: Leg Upper)     Patient location during evaluation: PACU Anesthesia Type: General Level of consciousness: awake and alert Pain management: pain level controlled Vital Signs Assessment: post-procedure vital signs reviewed and stable Respiratory status: spontaneous breathing, nonlabored ventilation, respiratory function stable and patient connected to nasal cannula oxygen Cardiovascular status: blood pressure returned to baseline and stable Postop Assessment: no apparent nausea or vomiting Anesthetic complications: no   No notable events documented.  Last Vitals:  Vitals:   05/31/21 2100 06/01/21 0500  BP: 123/67 126/88  Pulse: 86 85  Resp: 18 18  Temp: 37.4 C 37.2 C  SpO2: 98% 99%    Last Pain:  Vitals:   06/01/21 0500  TempSrc: Oral  PainSc:                  March Rummage Aadhya Bustamante

## 2021-06-01 NOTE — Progress Notes (Signed)
Patient ID: Dalton Miller, male   DOB: [birth year], 76 y.o.   MRN: [id number] Met with the patient to review rehab process, team conference and plan of care. Discussed current situation, previous CVA and secondary risk factors including HTN, HLD, DM. Patient noted he was not taking diabetic meds PTA; always gets put on insulin when he comes into the hospital and his blood sugars go up and down. Reviewed lovenox x 30 days per ortho; lovenox educ kit to room. Patient with constipation; given supp with good results (black tarry stool) and foley removed per nursing but unable to void at present. Reports limited weight tolerated on left hip and pain is at a level 6 p toileting. Continue to follow along to discharge to address educational needs to facilitate preparation for discharge. Margarito Liner

## 2021-06-02 DIAGNOSIS — S72001A Fracture of unspecified part of neck of right femur, initial encounter for closed fracture: Secondary | ICD-10-CM

## 2021-06-02 DIAGNOSIS — R339 Retention of urine, unspecified: Secondary | ICD-10-CM | POA: Diagnosis not present

## 2021-06-02 DIAGNOSIS — I10 Essential (primary) hypertension: Secondary | ICD-10-CM

## 2021-06-02 DIAGNOSIS — Z7409 Other reduced mobility: Secondary | ICD-10-CM

## 2021-06-02 DIAGNOSIS — Z789 Other specified health status: Secondary | ICD-10-CM

## 2021-06-02 LAB — CBC WITH DIFFERENTIAL/PLATELET
Abs Immature Granulocytes: 0.01 10*3/uL (ref 0.00–0.07)
Basophils Absolute: 0 10*3/uL (ref 0.0–0.1)
Basophils Relative: 1 %
Eosinophils Absolute: 0.1 10*3/uL (ref 0.0–0.5)
Eosinophils Relative: 2 %
HCT: 22.5 % — ABNORMAL LOW (ref 39.0–52.0)
Hemoglobin: 7.6 g/dL — ABNORMAL LOW (ref 13.0–17.0)
Immature Granulocytes: 0 %
Lymphocytes Relative: 24 %
Lymphs Abs: 1.1 10*3/uL (ref 0.7–4.0)
MCH: 29.9 pg (ref 26.0–34.0)
MCHC: 33.8 g/dL (ref 30.0–36.0)
MCV: 88.6 fL (ref 80.0–100.0)
Monocytes Absolute: 0.5 10*3/uL (ref 0.1–1.0)
Monocytes Relative: 11 %
Neutro Abs: 2.8 10*3/uL (ref 1.7–7.7)
Neutrophils Relative %: 62 %
Platelets: 98 10*3/uL — ABNORMAL LOW (ref 150–400)
RBC: 2.54 MIL/uL — ABNORMAL LOW (ref 4.22–5.81)
RDW: 16.6 % — ABNORMAL HIGH (ref 11.5–15.5)
WBC: 4.5 10*3/uL (ref 4.0–10.5)
nRBC: 0 % (ref 0.0–0.2)

## 2021-06-02 LAB — COMPREHENSIVE METABOLIC PANEL
ALT: 17 U/L (ref 0–44)
AST: 23 U/L (ref 15–41)
Albumin: 2.6 g/dL — ABNORMAL LOW (ref 3.5–5.0)
Alkaline Phosphatase: 123 U/L (ref 38–126)
Anion gap: 8 (ref 5–15)
BUN: 23 mg/dL (ref 8–23)
CO2: 23 mmol/L (ref 22–32)
Calcium: 8.4 mg/dL — ABNORMAL LOW (ref 8.9–10.3)
Chloride: 102 mmol/L (ref 98–111)
Creatinine, Ser: 1.05 mg/dL (ref 0.61–1.24)
GFR, Estimated: >60 mL/min (ref >60)
Glucose, Bld: 204 mg/dL — ABNORMAL HIGH (ref 70–99)
Potassium: 4 mmol/L (ref 3.5–5.1)
Sodium: 133 mmol/L — ABNORMAL LOW (ref 135–145)
Total Bilirubin: 0.7 mg/dL (ref 0.3–1.2)
Total Protein: 5.4 g/dL — ABNORMAL LOW (ref 6.5–8.1)

## 2021-06-02 LAB — GLUCOSE, CAPILLARY
Glucose-Capillary: 196 mg/dL — ABNORMAL HIGH (ref 70–99)
Glucose-Capillary: 142 mg/dL — ABNORMAL HIGH (ref 70–99)
Glucose-Capillary: 198 mg/dL — ABNORMAL HIGH (ref 70–99)
Glucose-Capillary: 184 mg/dL — ABNORMAL HIGH (ref 70–99)

## 2021-06-02 MED ORDER — APIXABAN 2.5 MG PO TABS
2.5000 mg | ORAL_TABLET | Freq: Two times a day (BID) | ORAL | Status: DC
  Administered 2021-06-02 – 2021-06-18 (×33): 2.5 mg via ORAL
  Filled 2021-06-02 (×33): qty 1

## 2021-06-02 MED ORDER — METFORMIN HCL 500 MG PO TABS: 500.0000 mg | ORAL_TABLET | Freq: Every day | ORAL | Status: DC

## 2021-06-02 MED ORDER — TAMSULOSIN HCL 0.4 MG PO CAPS
0.4000 mg | ORAL_CAPSULE | Freq: Every day | ORAL | Status: DC
  Administered 2021-06-02 – 2021-06-04 (×3): 0.4 mg via ORAL
  Filled 2021-06-02 (×3): qty 1

## 2021-06-02 NOTE — Progress Notes (Signed)
Inpatient Rehabilitation  Patient information reviewed and entered into eRehab system by Sharnita Bogucki M. Melane Windholz, M.A., CCC/SLP, PPS Coordinator.  Information including medical coding, functional ability and quality indicators will be reviewed and updated through discharge.    

## 2021-06-02 NOTE — Progress Notes (Signed)
Occupational Therapy Session Note  Patient Details  Name: Dalton Miller MRN: [id number] Date of Birth: 06-May-1945  Today's Date: 06/02/2021 OT Individual Time: 0240-9735 OT Individual Time Calculation (min): 58 min    Short Term Goals: Week 1:  OT Short Term Goal 1 (Week 1): Pt will complete sit > stand in prep for ADL with min A using LRAD OT Short Term Goal 2 (Week 1): Pt will utilize LUE at diminished level for ADL tasks with supervision OT Short Term Goal 3 (Week 1): Pt will tolerate at least 2 mins of standing using LRAD and mod A for balance to promote endurance needed for toileting/LB self-care OT Short Term Goal 4 (Week 1): Pt will use AE PRN for threading of BLE into pants with no more than min A for seated balance  Skilled Therapeutic Interventions/Progress Updates:  Skilled OT intervention completed with focus on functional transfers and activity tolerance. Pt received upright in bed, no initial c/o pain without movement, however up to 7/10 pain in L hip with bed mobility and transfers, with rest breaks, repositioning and ice utilized for pain reduction.   Completed bed mobility to L side, with mod A needed for LLE and trunk control, then scooting for feet supported on ground. Utilized stedy for time management with pt maintaining sitting balance with supervision for placement of feet on foot pads, then min A stand using BUE to pull up. Pt's slippers with lack of tread, therefore educated pt on having family bring tennis shoes for LE support and grip for transfers. Dependent transfer to w/c, min A needed for descent to w/c. Transported in w/c dependently for time.  With demonstration and education provided about sit > stand technique including pushing up with strong hand and stabilizing with weaker hand on RW, as well as stand pivot method for sliding feet vs heavy weight shifting to minimize pain, pt was able to complete the following throughout session:  Sit > stand using RW x4 with  max fading to mod A, cues for reaching RUE to RW once in stance. Stand pivot using RW from w/c <> EOM, then w/c > EOB with max A all trials. Improved tolerance with WBAT on LLE this session.  Pt required safety cues for positioning of RW, for increased stability being underneath BUE vs in front of him, with pt demonstrating very motivated behavior. Pt was tearful after his progress, stating "I just need a new body", with therapist providing emotional support about his body being given to him with a purpose, but relearning how to modify and make things doable for his abilities, as well as rehab purpose and goals. Pt attempted to use BUE to self propel in w/c however poor coordination of LUE needed for propulsion. Transported dependently for time to room. Mod A needed for bed mobility for LLE management. Pt was left upright in bed, with bed alarm on, ice pack on L hip, and all needs in reach at end of session.   Therapy Documentation Precautions:  Precautions Precautions: Fall Restrictions Weight Bearing Restrictions: Yes LLE Weight Bearing: Weight bearing as tolerated    Therapy/Group: Individual Therapy  Wilton Thrall E Amiri Tritch 06/02/2021, 7:49 AM

## 2021-06-02 NOTE — Evaluation (Signed)
Physical Therapy Assessment and Plan  Patient Details  Name: Dalton Miller MRN: [id number] Date of Birth: [birth year]  PT Diagnosis: Difficulty walking, Hemiparesis non-dominant, Muscle weakness, and Pain in joint Rehab Potential: Good ELOS: 14-16 days   Today's Date: 06/02/2021 PT Individual Time: 1000-1110 PT Individual Time Calculation (min): 70 min    Hospital Problem: Principal Problem:   Hip fracture requiring operative repair [facility])   Past Medical History:  Past Medical History:  Diagnosis Date   Acquired renal cyst of right kidney    Arthritis    Cancer ([place])    renal cell    Cirrhosis ([place])    Erectile dysfunction    Heart murmur    CHILDHOOD   Hematuria    left ureteral bleeding   History of cellulitis    2012 left wrist   History of kidney stones    Hyperlipidemia    Hypertension    Metastatic melanoma to lung, left ([place])    Nephrolithiasis    bilateral non-obstructive per ct 07-02-2015   Type 2 diabetes mellitus ([place])    Wears glasses    Past Surgical History:  Past Surgical History:  Procedure Laterality Date   CARPAL TUNNEL RELEASE Right 1990  approx   COLONOSCOPY  03/04/2010   CYSTOSCOPY W/ RETROGRADES Left 10/28/2015   Procedure: CYSTOSCOPY WITH LEFT RETROGRADE PYELOGRAM;  Surgeon: Cleon Gustin, MD;  Location: [facility];  Service: Urology;  Laterality: Left;   CYSTOSCOPY W/ URETERAL STENT PLACEMENT Left 10/28/2015   Procedure: CYSTOSCOPY WITH LEFT URETERAL STENT EXCHANGE;  Surgeon: Cleon Gustin, MD;  Location: [facility];  Service: Urology;  Laterality: Left;   CYSTOSCOPY WITH RETROGRADE PYELOGRAM, URETEROSCOPY AND STENT PLACEMENT Bilateral 09/17/2015   Procedure: CYSTOSCOPY WITH BILATERAL RETROGRADE PYELOGRAM, LEFT URETEROSCOPY WITH URETERAL BIOPSY AND  STENT PLACEMENT;  Surgeon: Irine Seal, MD;  Location: [facility];  Service: Urology;  Laterality: Bilateral;   ESOPHAGEAL BANDING  12/30/2020   Procedure: ESOPHAGEAL BANDING;   Surgeon: Ladene Artist, MD;  Location: [facility];  Service: Endoscopy;;   ESOPHAGOGASTRODUODENOSCOPY (EGD) WITH PROPOFOL N/A 12/30/2020   Procedure: ESOPHAGOGASTRODUODENOSCOPY (EGD) WITH PROPOFOL;  Surgeon: Ladene Artist, MD;  Location: [facility] ENDOSCOPY;  Service: Endoscopy;  Laterality: N/A;   HOLMIUM LASER APPLICATION Left 61/44/3154   Procedure: HOLMIUM LASER APPLICATION;  Surgeon: Irine Seal, MD;  [facility];  Service: Urology;  Laterality: Left;   INTRAMEDULLARY (IM) NAIL INTERTROCHANTERIC Left 05/28/2021   Procedure: INTRAMEDULLARY (IM) NAIL INTERTROCHANTRIC;  Surgeon: Shona Needles, MD;  Location: [place];  Service: Orthopedics;  Laterality: Left;   KNEE ARTHROSCOPY Right 2008   MELANOMA EXCISION  2018   Left arm   right total knee replacement  Right    2009   ROBOT ASSITED LAPAROSCOPIC NEPHROURETERECTOMY Left 12/04/2015   Procedure: XI ROBOT ASSITED LAPAROSCOPIC NEPHROURETERECTOMY;  Surgeon: Cleon Gustin, MD;  Location: [facility];  Service: Urology;  Laterality: Left;   ROTATOR CUFF REPAIR Right    SHOULDER ARTHROSCOPY Right 12/17/2020   Procedure: Right shoulder arthroscopy, debridement, subacromial decompression, distal clavicle resection;  Surgeon: Justice Britain, MD;  Location: [facility];  Service: Orthopedics;  Laterality: Right;  72mn   STONE EXTRACTION WITH BASKET Left 09/17/2015   Procedure: STONE EXTRACTION WITH BASKET;  Surgeon: JIrine Seal MD;  [facility]  Service: Urology;  Laterality: Left;   TOTAL KNEE ARTHROPLASTY Right 2009   URETEROSCOPY  10/28/2015   Procedure: DIAGNOSTIC LEFT URETEROSCOPY;  Surgeon: PSaralyn Pilar  Alita Chyle, MD;  Location: [facility];  Service: Urology;;   WISDOM TOOTH EXTRACTION      Assessment & Plan Clinical Impression:  Leonidus Rowand. Lenzen is a 76 year old male with history of melanoma of lung with mets, T2DM,  Renal cell CA, COPD--1 PPD smoker, Right MCA infarct 29/5188 complicated by GIB secondary to  esophageal varices, Covid and hepatic encephalopathy due to new diagnosis of liver cirrhosis. He was admitted on 05/27/21 after a fall with inability to stand due to severe pain. He was found to have left IT femur fracture and underwent ORIF left femur by Dr. Doreatha Martin. Post of WBAT and on Lovenox for DVT prophylaxis w/recs to transition to DOAC X 30 days at discharge.    Post op noted to have hyponatremia as well as poorly controlled BS. ABLA with drop in Hgb to 6.8 treated with one unit PRBC and thrombocytopenia being monitored. Foley was placed due to urinary retention and d/c this am for voiding trial. PT/OT has been working with patient and he was noted to be limited by pain, weakness and working on pre-gait activity. CIR recommended due to functional decline. Patient transferred to CIR on 06/01/2021 .   Patient currently requires total with mobility secondary to muscle weakness, muscle joint tightness, and pain, decreased cardiorespiratoy endurance, and decreased sitting balance, decreased standing balance, decreased postural control, decreased balance strategies, and L hemiparesis .  Prior to hospitalization, patient was min with mobility and lived with Family, Son (2 sons and DIL) in a Mobile home home.  Home access is  Level entry.  Patient will benefit from skilled PT intervention to maximize safe functional mobility, minimize fall risk, and decrease caregiver burden for planned discharge home with 24 hour assist.  Anticipate patient will benefit from follow up [facility] at discharge.  PT - End of Session Activity Tolerance: Tolerates 30+ min activity with multiple rests Endurance Deficit: Yes (Simultaneous filing. User may not have seen previous data.) Endurance Deficit Description: frequent rest breaks due to fatigue PT Assessment Rehab Potential (ACUTE/IP ONLY): Good PT Barriers to Discharge: Other (comments) (pain) PT Patient demonstrates impairments in the following area(s):  Balance;Endurance;Pain;Safety PT Transfers Functional Problem(s): Bed Mobility;Bed to Chair;Car;Furniture;Floor PT Locomotion Functional Problem(s): Ambulation;Wheelchair Mobility;Stairs PT Plan PT Intensity: Minimum of 1-2 x/day ,45 to 90 minutes PT Frequency: 5 out of 7 days PT Duration Estimated Length of Stay: 14-16 days PT Treatment/Interventions: Ambulation/gait training;Balance/vestibular training;Community reintegration;Discharge planning;Disease management/prevention;DME/adaptive equipment instruction;Functional mobility training;Neuromuscular re-education;Pain management;Patient/family education;Therapeutic Activities;Therapeutic Exercise;UE/LE Strength taining/ROM;UE/LE Coordination activities;Wheelchair propulsion/positioning PT Transfers Anticipated Outcome(s): min A PT Locomotion Anticipated Outcome(s): min A at w/c level with short distance ambulation PT Recommendation Follow Up Recommendations: Home health PT;24 hour supervision/assistance Patient destination: Home Equipment Recommended: Wheelchair (measurements);Wheelchair cushion (measurements) Equipment Details: TBD pending progress   PT Evaluation Precautions/Restrictions Precautions Precautions: Fall Restrictions Weight Bearing Restrictions: Yes LLE Weight Bearing: Weight bearing as tolerated Pain Interference Pain Interference Pain Effect on Sleep: 2. Occasionally Pain Interference with Therapy Activities: 4. Almost constantly Pain Interference with Day-to-Day Activities: 4. Almost constantly Home Living/Prior Functioning Home Living Available Help at Discharge: Family;Available 24 hours/day Type of Home: Mobile home Home Access: Level entry Home Layout: One level  Lives With: Family;Son (2 sons and DIL) Prior Function Level of Independence: Independent with gait;Independent with transfers;Requires assistive device for independence  Able to Take Stairs?: No Driving: No Vision/Perception  Vision -  History Patient Visual Report: No change from baseline Perception Perception: Within Functional Limits Praxis Praxis: Intact  Cognition Overall Cognitive Status: Within Functional Limits for tasks assessed Arousal/Alertness: Awake/alert Orientation Level: Oriented X4 Year: 2023 Attention: Focused Focused Attention: Appears intact Memory: Appears intact Awareness: Appears intact Problem Solving: Appears intact Safety/Judgment: Appears intact Sensation Sensation Light Touch: Impaired Detail Light Touch Impaired Details: Impaired LLE (reports numbness since surgery) Proprioception: Appears Intact Coordination Gross Motor Movements are Fluid and Coordinated: No Fine Motor Movements are Fluid and Coordinated: No Coordination and Movement Description: impaired 2/2 pain and residual L hemi Heel Shin Test: unable to assess 2/2 pain Motor  Motor Motor: Hemiplegia;Abnormal postural alignment and control Motor - Skilled Clinical Observations: mild L hemi; impaired 2/2 pain  Trunk/Postural Assessment  Cervical Assessment Cervical Assessment: Exceptions to [facility] (forward head) Thoracic Assessment Thoracic Assessment: Within Functional Limits Lumbar Assessment Lumbar Assessment: Exceptions to [facility] (posterior pelvic tilt) Postural Control Postural Control: Deficits on evaluation Righting Reactions: impaired 2/2 pain  Balance Balance Balance Assessed: Yes Static Sitting Balance Static Sitting - Balance Support: No upper extremity supported;Feet supported Static Sitting - Level of Assistance: 5: Stand by assistance Dynamic Sitting Balance Dynamic Sitting - Balance Support: No upper extremity supported;Feet supported;During functional activity Dynamic Sitting - Level of Assistance: 5: Stand by assistance;4: Min assist Static Standing Balance Static Standing - Balance Support: Bilateral upper extremity supported;During functional activity Static Standing - Level of Assistance: 4: Min  assist Extremity Assessment   RLE Assessment RLE Assessment: Within Functional Limits Passive Range of Motion (PROM) Comments: tight HS General Strength Comments: 5/5 grossly LLE Assessment LLE Assessment: Exceptions to [facility] Passive Range of Motion (PROM) Comments: tight HS; limited hip ROM 2/2 pain General Strength Comments: impaired 2/2 pain LLE Strength Left Hip Flexion: 2-/5 Left Knee Flexion: 3/5 Left Knee Extension: 3/5 Left Ankle Dorsiflexion: 4/5  Care Tool Care Tool Bed Mobility Roll left and right activity   Roll left and right assist level: Moderate Assistance - Patient 50 - 74%    Sit to lying activity   Sit to lying assist level: Moderate Assistance - Patient 50 - 74%    Lying to sitting on side of bed activity   Lying to sitting on side of bed assist level: the ability to move from lying on the back to sitting on the side of the bed with no back support.: Maximal Assistance - Patient 25 - 49%     Care Tool Transfers Sit to stand transfer   Sit to stand assist level: Maximal Assistance - Patient 25 - 49%    Chair/bed transfer   Chair/bed transfer assist level: Dependent - mechanical lift     Toilet transfer   Assist Level: Dependent - Patient 0% (stedy, simulated to recliner)    Scientist, product/process development transfer activity did not occur: Safety/medical concerns        Care Tool Locomotion Ambulation Ambulation activity did not occur: Safety/medical concerns        Walk 10 feet activity Walk 10 feet activity did not occur: Safety/medical concerns       Walk 50 feet with 2 turns activity Walk 50 feet with 2 turns activity did not occur: Safety/medical concerns      Walk 150 feet activity Walk 150 feet activity did not occur: Safety/medical concerns      Walk 10 feet on uneven surfaces activity Walk 10 feet on uneven surfaces activity did not occur: Safety/medical concerns      Stairs Stair activity did not occur: Safety/medical concerns        Walk  up/down 1  step activity Walk up/down 1 step or curb (drop down) activity did not occur: Safety/medical concerns      Walk up/down 4 steps activity Walk up/down 4 steps activity did not occur: Safety/medical concerns      Walk up/down 12 steps activity Walk up/down 12 steps activity did not occur: Safety/medical concerns      Pick up small objects from floor Pick up small object from the floor (from standing position) activity did not occur: Safety/medical concerns      Wheelchair Is the patient using a wheelchair?: Yes Type of Wheelchair: Manual   Wheelchair assist level: Minimal Assistance - Patient > 75% Max wheelchair distance: 25'  Wheel 50 feet with 2 turns activity   Assist Level: Moderate Assistance - Patient 50 - 74%  Wheel 150 feet activity   Assist Level: Total Assistance - Patient < 25%    Refer to Care Plan for Long Term Goals  SHORT TERM GOAL WEEK 1 PT Short Term Goal 1 (Week 1): Pt will complete bed mobility with min A consistently PT Short Term Goal 2 (Week 1): Pt will complete least restrictive transfer with mod A consistently PT Short Term Goal 3 (Week 1): Pt will initiate gait training PT Short Term Goal 4 (Week 1): Pt will propel w/c x 50 ft with min A  Recommendations for other services: None   Skilled Therapeutic Intervention Evaluation completed (see details above and below) with education on PT POC and goals and individual treatment initiated with focus on functional mobility assessment, setting pt up with appropriate equipment to be utilized during rehab stay, orientation to rehab unit and schedule, etc. Pt received supine in bed, agreeable to PT evaluation. No complaints of pain at rest, does have onset of L hip pain with mobility. Hip pain not rated and declines pain medication during session. Utilized ice pack to L hip at end of session for pain management. Supine to sit with max A for LE management and trunk elevation. Per OT report pt is max A to stand  to RW, unable to transfer with RW due to poor tolerance for WB on LLE. Pt agreeable to transfer via stedy. Sit to stand with mod A to stedy from elevated bed. Stedy transfer bed to w/c. Manual w/c propulsion x 25 ft with use of R UE and LE with min A needed for steering. Pt has difficulty propelling with assist from LUE due to residual hemiparesis from prior CVA. Pt declines to attempt any standing in // bars or to RW this session due to pain. Seated LLE therex: AAROM hip flex x 10 reps, LAQ, ankle pumps, and hip add squeeze x 10 reps each. Pt requests to return to bed at end of session due to fatigue. Sit to stand with max A to stedy from w/c. Stedy transfer back to bed. Sit to supine mod A needed for BLE management. Pt left seated in bed with needs in reach, bed alarm in place, ice pack to L hip.  Mobility Bed Mobility Bed Mobility: Right Sidelying to Sit;Sitting - Scoot to [employer] of Bed Rolling Right: Moderate Assistance - Patient 50-74% Rolling Left: Moderate Assistance - Patient 50-74% Right Sidelying to Sit: Moderate Assistance - Patient 50-74% Supine to Sit: Maximal Assistance - Patient - Patient 25-49% Sitting - Scoot to Edge of Bed: Moderate Assistance - Patient 50-74% Sit to Supine: Moderate Assistance - Patient 50-74% Transfers Transfers: Sit to Stand;Transfer Sit to Stand: Moderate Assistance - Patient 50-74% Transfer (Assistive device): Rolling  walker Transfer via Lift Equipment: Haematologist / Scientist, research (life sciences): No Architect: Yes Wheelchair Assistance: Minimal assistance - Patient >75% Environmental health practitioner: Right upper extremity;Right lower extremity Wheelchair Parts Management: Needs assistance Distance: 25   Discharge Criteria: Patient will be discharged from PT if patient refuses treatment 3 consecutive times without medical reason, if treatment goals not met, if there is a change in medical status, if patient makes no  progress towards goals or if patient is discharged from hospital.  The above assessment, treatment plan, treatment alternatives and goals were discussed and mutually agreed upon: by patient   Excell Seltzer, PT, DPT, CSRS 06/02/2021, 12:36 PM

## 2021-06-02 NOTE — Plan of Care (Signed)
  Problem: Sit to Stand Goal: LTG:  Patient will perform sit to stand with assistance level (PT) Description: LTG:  Patient will perform sit to stand with assistance level (PT) Flowsheets (Taken 06/02/2021 1246) LTG: PT will perform sit to stand in preparation for functional mobility with assistance level: Minimal Assistance - Patient > 75%   Problem: RH Bed Mobility Goal: LTG Patient will perform bed mobility with assist (PT) Description: LTG: Patient will perform bed mobility with assistance, with/without cues (PT). Flowsheets (Taken 06/02/2021 1246) LTG: Pt will perform bed mobility with assistance level of: Supervision/Verbal cueing   Problem: RH Bed to Chair Transfers Goal: LTG Patient will perform bed/chair transfers w/assist (PT) Description: LTG: Patient will perform bed to chair transfers with assistance (PT). Flowsheets (Taken 06/02/2021 1246) LTG: Pt will perform Bed to Chair Transfers with assistance level: Minimal Assistance - Patient > 75%   Problem: RH Car Transfers Goal: LTG Patient will perform car transfers with assist (PT) Description: LTG: Patient will perform car transfers with assistance (PT). Flowsheets (Taken 06/02/2021 1246) LTG: Pt will perform car transfers with assist:: Minimal Assistance - Patient > 75%   Problem: RH Ambulation Goal: LTG Patient will ambulate in controlled environment (PT) Description: LTG: Patient will ambulate in a controlled environment, # of feet with assistance (PT). Flowsheets (Taken 06/02/2021 1246) LTG: Pt will ambulate in controlled environ  assist needed:: Minimal Assistance - Patient > 75% LTG: Ambulation distance in controlled environment: 25 ft with LRAD Goal: LTG Patient will ambulate in home environment (PT) Description: LTG: Patient will ambulate in home environment, # of feet with assistance (PT). Flowsheets (Taken 06/02/2021 1246) LTG: Pt will ambulate in home environ  assist needed:: Minimal Assistance - Patient > 75% LTG:  Ambulation distance in home environment: 10 ft with LRAD   Problem: RH Wheelchair Mobility Goal: LTG Patient will propel w/c in controlled environment (PT) Description: LTG: Patient will propel wheelchair in controlled environment, # of feet with assist (PT) Flowsheets (Taken 06/02/2021 1246) LTG: Pt will propel w/c in controlled environ  assist needed:: Supervision/Verbal cueing LTG: Propel w/c distance in controlled environment: 150 ft Goal: LTG Patient will propel w/c in home environment (PT) Description: LTG: Patient will propel wheelchair in home environment, # of feet with assistance (PT). Flowsheets (Taken 06/02/2021 1246) LTG: Pt will propel w/c in home environ  assist needed:: Supervision/Verbal cueing LTG: Propel w/c distance in home environment: 75 ft

## 2021-06-02 NOTE — Plan of Care (Signed)
  Problem: Consults Goal: RH GENERAL PATIENT EDUCATION Description: See Patient Education module for education specifics. Outcome: Progressing   Problem: RH BOWEL ELIMINATION Goal: RH STG MANAGE BOWEL WITH ASSISTANCE Description: STG Manage Bowel with mod I  Assistance. Outcome: Progressing Goal: RH STG MANAGE BOWEL W/MEDICATION W/ASSISTANCE Description: STG Manage Bowel with Medication with mod I  Assistance. Outcome: Progressing   Problem: RH SKIN INTEGRITY Goal: RH STG SKIN FREE OF INFECTION/BREAKDOWN Description: W min assist Outcome: Progressing Goal: RH STG MAINTAIN SKIN INTEGRITY WITH ASSISTANCE Description: STG Maintain Skin Integrity With min Assistance. Outcome: Progressing Goal: RH STG ABLE TO PERFORM INCISION/WOUND CARE W/ASSISTANCE Description: STG Able To Perform Incision/Wound Care With min Assistance. Outcome: Progressing   Problem: RH SAFETY Goal: RH STG ADHERE TO SAFETY PRECAUTIONS W/ASSISTANCE/DEVICE Description: STG Adhere to Safety Precautions With cues Assistance/Device. Outcome: Progressing   Problem: RH PAIN MANAGEMENT Goal: RH STG PAIN MANAGED AT OR BELOW PT'S PAIN GOAL Description: At or below level 4 with prns Outcome: Progressing   Problem: RH KNOWLEDGE DEFICIT GENERAL Goal: RH STG INCREASE KNOWLEDGE OF SELF CARE AFTER HOSPITALIZATION Description: Patient and daughter will be able to manage care at discharge using handouts and educational resources independently Outcome: Progressing

## 2021-06-02 NOTE — Evaluation (Signed)
Occupational Therapy Assessment and Plan  Patient Details  Name: Dalton Miller MRN: [id number] Date of Birth: 03-Feb-1945  OT Diagnosis: abnormal posture, acute pain, blindness and low vision, muscle weakness (generalized), pain in joint, and swelling of limb Rehab Potential: Rehab Potential (ACUTE ONLY): Good ELOS: 2 weeks   Today's Date: 06/02/2021 OT Individual Time: 0800-0910 OT Individual Time Calculation (min): 70 min     Hospital Problem: Principal Problem:   Hip fracture requiring operative repair [facility])   Past Medical History:  Past Medical History:  Diagnosis Date   Acquired renal cyst of right kidney    Arthritis    Cancer ([place])    renal cell    Cirrhosis ([place])    Erectile dysfunction    Heart murmur    CHILDHOOD   Hematuria    left ureteral bleeding   History of cellulitis    2012 left wrist   History of kidney stones    Hyperlipidemia    Hypertension    Metastatic melanoma to lung, left ([place])    Nephrolithiasis    bilateral non-obstructive per ct 07-02-2015   Type 2 diabetes mellitus ([place])    Wears glasses    Past Surgical History:  Past Surgical History:  Procedure Laterality Date   CARPAL TUNNEL RELEASE Right 1990  approx   COLONOSCOPY  03/04/2010   CYSTOSCOPY W/ RETROGRADES Left 10/28/2015   Procedure: CYSTOSCOPY WITH LEFT RETROGRADE PYELOGRAM;  Surgeon: Cleon Gustin, MD;  Location: [facility];  Service: Urology;  Laterality: Left;   CYSTOSCOPY W/ URETERAL STENT PLACEMENT Left 10/28/2015   Procedure: CYSTOSCOPY WITH LEFT URETERAL STENT EXCHANGE;  Surgeon: Cleon Gustin, MD;  Location: [facility];  Service: Urology;  Laterality: Left;   CYSTOSCOPY WITH RETROGRADE PYELOGRAM, URETEROSCOPY AND STENT PLACEMENT Bilateral 09/17/2015   Procedure: CYSTOSCOPY WITH BILATERAL RETROGRADE PYELOGRAM, LEFT URETEROSCOPY WITH URETERAL BIOPSY AND  STENT PLACEMENT;  Surgeon: Irine Seal, MD;  Location: [facility];  Service: Urology;  Laterality: Bilateral;    ESOPHAGEAL BANDING  12/30/2020   Procedure: ESOPHAGEAL BANDING;  Surgeon: Ladene Artist, MD;  Location: [facility] ENDOSCOPY;  Service: Endoscopy;;   ESOPHAGOGASTRODUODENOSCOPY (EGD) WITH PROPOFOL N/A 12/30/2020   Procedure: ESOPHAGOGASTRODUODENOSCOPY (EGD) WITH PROPOFOL;  Surgeon: Ladene Artist, MD;  Location: [facility] ENDOSCOPY;  Service: Endoscopy;  Laterality: N/A;   HOLMIUM LASER APPLICATION Left 13/24/4010   Procedure: HOLMIUM LASER APPLICATION;  Surgeon: Irine Seal, MD;  Location: [facility];  Service: Urology;  Laterality: Left;   INTRAMEDULLARY (IM) NAIL INTERTROCHANTERIC Left 05/28/2021   Procedure: INTRAMEDULLARY (IM) NAIL INTERTROCHANTRIC;  Surgeon: Shona Needles, MD;  Location: [facility];  Service: Orthopedics;  Laterality: Left;   KNEE ARTHROSCOPY Right 2008   MELANOMA EXCISION  2018   Left arm   right total knee replacement  Right    2009   ROBOT ASSITED LAPAROSCOPIC NEPHROURETERECTOMY Left 12/04/2015   Procedure: XI ROBOT ASSITED LAPAROSCOPIC NEPHROURETERECTOMY;  Surgeon: Cleon Gustin, MD;  Location: [facility];  Service: Urology;  Laterality: Left;   ROTATOR CUFF REPAIR Right    SHOULDER ARTHROSCOPY Right 12/17/2020   Procedure: Right shoulder arthroscopy, debridement, subacromial decompression, distal clavicle resection;  Surgeon: Justice Britain, MD;  Location: [facility];  Service: Orthopedics;  Laterality: Right;  6mn   STONE EXTRACTION WITH BASKET Left 09/17/2015   Procedure: STONE EXTRACTION WITH BASKET;  Surgeon: JIrine Seal MD;  [facility]  Service: Urology;  Laterality: Left;   TOTAL KNEE ARTHROPLASTY Right 2009  URETEROSCOPY  10/28/2015   Procedure: DIAGNOSTIC LEFT URETEROSCOPY;  Surgeon: Cleon Gustin, MD;  Location: [facility];  Service: Urology;;   WISDOM TOOTH EXTRACTION      Assessment & Plan Clinical Impression:   Dalton Miller is a 76 year old male with history of melanoma of lung with mets, T2DM,  Renal cell CA, COPD--1  PPD smoker, Right MCA infarct 55/9741 complicated by GIB secondary to esophageal varices, Covid and hepatic encephalopathy due to new diagnosis of liver cirrhosis. He was admitted on 05/27/21 after a fall with inability to stand due to severe pain. He was found to have left IT femur fracture and underwent ORIF left femur by Dr. Doreatha Martin. Post of WBAT and on Lovenox for DVT prophylaxis w/recs to transition to DOAC X 30 days at discharge.    Post op noted to have hyponatremia as well as poorly controlled BS. ABLA with drop in Hgb to 6.8 treated with one unit PRBC and thrombocytopenia being monitored. Foley was placed due to urinary retention and d/c this am for voiding trial. PT/OT has been working with patient and he was noted to be limited by pain, weakness and working on pre-gait activity. CIR recommended due to functional decline. Patient transferred to CIR on 06/01/2021 .    Patient currently requires max with basic self-care skills secondary to muscle weakness, decreased cardiorespiratoy endurance, decreased coordination, and decreased sitting balance, decreased standing balance, decreased postural control, and difficulty maintaining precautions.  Prior to hospitalization, patient could complete all ADLs and functional transfers with mod I.  Patient will benefit from skilled intervention to decrease level of assist with basic self-care skills and increase independence with basic self-care skills prior to discharge home with 2 sons and daughter in law.  Anticipate patient will require minimal physical assistance and follow up home health.  OT - End of Session Activity Tolerance: Tolerates < 10 min activity, no significant change in vital signs Endurance Deficit: Yes (Simultaneous filing. User may not have seen previous data.) Endurance Deficit Description: frequent rest breaks due to fatigue OT Assessment Rehab Potential (ACUTE ONLY): Good OT Barriers to Discharge: Home environment  access/layout;Incontinence OT Patient demonstrates impairments in the following area(s): Balance;Edema;Endurance;Motor;Pain;Safety;Vision OT Basic ADL's Functional Problem(s): Grooming;Dressing;Bathing;Toileting OT Transfers Functional Problem(s): Toilet OT Additional Impairment(s): Fuctional Use of Upper Extremity OT Plan OT Intensity: Minimum of 1-2 x/day, 45 to 90 minutes OT Frequency: 5 out of 7 days OT Duration/Estimated Length of Stay: 2 weeks OT Treatment/Interventions: Balance/vestibular training;Discharge planning;Functional electrical stimulation;Pain management;Self Care/advanced ADL retraining;Therapeutic Activities;UE/LE Coordination activities;Functional mobility training;Patient/family education;Therapeutic Exercise;Visual/perceptual remediation/compensation;Community reintegration;DME/adaptive equipment instruction;Neuromuscular re-education;Splinting/orthotics;UE/LE Strength taining/ROM;Wheelchair propulsion/positioning OT Self Feeding Anticipated Outcome(s): Mod I OT Basic Self-Care Anticipated Outcome(s): Min A OT Toileting Anticipated Outcome(s): Min A OT Bathroom Transfers Anticipated Outcome(s): CGA OT Recommendation Recommendations for Other Services: Therapeutic Recreation consult Therapeutic Recreation Interventions: Pet therapy;Outing/community reintergration Patient destination: Home Follow Up Recommendations: Home health OT Equipment Recommended: To be determined   OT Evaluation Precautions/Restrictions  Precautions Precautions: Fall Restrictions Weight Bearing Restrictions: Yes LLE Weight Bearing: Weight bearing as tolerated Home Living/Prior Functioning Home Living Family/patient expects to be discharged to:: Private residence Living Arrangements: Children Available Help at Discharge: Family, Available 24 hours/day Type of Home: Mobile home Home Access: Level entry Home Layout: One level Bathroom Shower/Tub: Tub/shower unit, Architectural technologist:  Standard Bathroom Accessibility: Yes Additional Comments: has shower seat, thinks he has 3 in 1 BSC at home  Lives With: Family, Son (2 sons and DIL) IADL History Homemaking  Responsibilities: Yes Laundry Responsibility: Primary Current License: Yes Mode of Transportation: Car Occupation: Retired, On disability 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 and camping Prior Function Level of Independence: Requires assistive device for independence, Needs assistance with ADLs Bath: Minimal Dressing: Minimal  Able to Take Stairs?: No Driving: No (reports MD says eyesight is too poor and prohibits it) Vision Baseline Vision/History: 1 Wears glasses Ability to See in Adequate Light: 0 Adequate Patient Visual Report: No change from baseline Vision Assessment?: Yes Eye Alignment: Within Functional Limits Ocular Range of Motion: Within Functional Limits Alignment/Gaze Preference: Within Defined Limits Tracking/Visual Pursuits: Decreased smoothness of horizontal tracking Saccades: Within functional limits Visual Fields: Left visual field deficit Additional Comments: Pt reports L peripherial visual deficit from CVA in december, had appt with low vision clinic set up at the [facility] Perception  Perception: Within Functional Limits Praxis Praxis: Intact Cognition Cognition Overall Cognitive Status: Within Functional Limits for tasks assessed Arousal/Alertness: Awake/alert Orientation Level: Person;Place;Situation Person: Oriented Place: Oriented Situation: Oriented Memory: Appears intact Awareness: Appears intact Problem Solving: Appears intact Safety/Judgment: Appears intact Brief Interview for Mental Status (BIMS) Repetition of Three Words (First Attempt): 3 Temporal Orientation: Year: Correct Temporal Orientation: Month: Accurate within 5 days Temporal Orientation: Day: Correct Recall: "Sock": Yes, no cue required Recall: "Blue": Yes, no cue  required Recall: "Bed": Yes, no cue required BIMS Summary Score: 15 Sensation Sensation Light Touch: Appears Intact Hot/Cold: Not tested Proprioception: Appears Intact Stereognosis: Not tested Coordination Gross Motor Movements are Fluid and Coordinated: No Fine Motor Movements are Fluid and Coordinated: No Coordination and Movement Description: generalized weakness and deconditioning 2/2 pain Finger Nose Finger Test: Overshooting on RUE, unable to complete on LUE from CVA in December 2022 Motor  Motor Motor: Hemiplegia;Abnormal postural alignment and control Motor - Skilled Clinical Observations: mild L hemi; impaired 2/2 pain  Trunk/Postural Assessment  Cervical Assessment Cervical Assessment: Exceptions to [facility] (forward head) Thoracic Assessment Thoracic Assessment: Within Functional Limits Lumbar Assessment Lumbar Assessment: Exceptions to [facility] (posterior pelvic tilt) Postural Control Postural Control: Deficits on evaluation Righting Reactions: impaired 2/2 pain  Balance Balance Balance Assessed: Yes Static Sitting Balance Static Sitting - Balance Support: No upper extremity supported;Feet supported Static Sitting - Level of Assistance: 5: Stand by assistance Dynamic Sitting Balance Dynamic Sitting - Balance Support: No upper extremity supported;Feet supported;During functional activity Dynamic Sitting - Level of Assistance: 5: Stand by assistance;4: Min assist Static Standing Balance Static Standing - Balance Support: Bilateral upper extremity supported;During functional activity Static Standing - Level of Assistance: 4: Min assist Extremity/Trunk Assessment RUE Assessment RUE Assessment: Exceptions to [facility] Active Range of Motion (AROM) Comments: WFL General Strength Comments: 4/5, limited by shoulder pain with pressure 2/2 s/p torn rotator cuff over a year ago LUE Assessment LUE Assessment: Exceptions to [facility] Passive Range of Motion (PROM) Comments: WFL Active Range  of Motion (AROM) Comments: <40 shoulder flexion, WFL elbow flexion General Strength Comments: 3/5 grossly  Care Tool Care Tool Self Care Eating   Eating Assist Level: Set up assist    Oral Care  Oral care, brush teeth, clean dentures activity did not occur: Refused (dentures)      Bathing   Body parts bathed by patient: Right arm;Left arm;Chest;Abdomen;Right upper leg;Left upper leg;Face Body parts bathed by helper: Front perineal area;Buttocks;Right lower leg;Left lower leg   Assist Level: Maximal Assistance - Patient 24 - 49%    Upper Body Dressing(including orthotics)   What is the patient  wearing?: Pull over shirt   Assist Level: Minimal Assistance - Patient > 75%    Lower Body Dressing (excluding footwear)   What is the patient wearing?: Incontinence brief;Pants Assist for lower body dressing: Total Assistance - Patient < 25% (sit > stand)    Putting on/Taking off footwear   What is the patient wearing?: Non-skid slipper socks Assist for footwear: Dependent - Patient 0%       Care Tool Toileting Toileting activity   Assist for toileting: Total Assistance - Patient < 25% (simulated)     Care Tool Bed Mobility Roll left and right activity        Sit to lying activity        Lying to sitting on side of bed activity   Lying to sitting on side of bed assist level: the ability to move from lying on the back to sitting on the side of the bed with no back support.: Maximal Assistance - Patient 25 - 49%     Care Tool Transfers Sit to stand transfer   Sit to stand assist level: Maximal Assistance - Patient 25 - 49%    Chair/bed transfer         Toilet transfer   Assist Level: Dependent - Patient 0% (stedy, simulated to recliner)     Care Tool Cognition  Expression of Ideas and Wants Expression of Ideas and Wants: 3. Some difficulty - exhibits some difficulty with expressing needs and ideas (e.g, some words or finishing thoughts) or speech is not clear   Understanding Verbal and Non-Verbal Content Understanding Verbal and Non-Verbal Content: 3. Usually understands - understands most conversations, but misses some part/intent of message. Requires cues at times to understand   Memory/Recall Ability Memory/Recall Ability : Current season;That he or she is in a hospital/hospital unit   Refer to Care Plan for [place] [facility] OT Short Term Goal 1 (Week 1): Pt will complete sit > stand in prep for ADL with min A using LRAD OT Short Term Goal 2 (Week 1): Pt will utilize LUE at diminished level for ADL tasks with supervision OT Short Term Goal 3 (Week 1): Pt will tolerate at least 2 mins of standing using LRAD and mod A for balance to promote endurance needed for toileting/LB self-care OT Short Term Goal 4 (Week 1): Pt will use AE PRN for threading of BLE into pants with no more than min A for seated balance  Recommendations for other services: Therapeutic Recreation  Pet therapy and Outing/community reintegration   Skilled Therapeutic Intervention Skilled OT intervention completed with discussion on POC, rehab goals and explanation of OT purpose. Pt received supine in bed, agreeable to session. No initial c/o pain without movement however 6/10 with movement or WB through LLE, pre-medicated, rest breaks and repositioning provided for pain reduction. Pt presents with residual deficits including strength/coordination of LUE and L visual field from prior CVA in December of 2022, which overall further impacts pt's self-care and functional mobility abilities. Pt very guarded of LLE, with limited ability to WBAT, or even maintain seated pressure on L hip, impacting sitting and standing balance. Max A needed for lying to sitting up on R side. Min A fading to supervision for sitting balance during ADLs. Pt completed bathing dressing at EOB. Sit > stand with mod A using RW, unable to weight shift for stand pivot with stedy utilized at mod A  for sit > stand to transfer to recliner. See  caretool for further details on assist level with self-care tasks performed. Pt left seated in recliner, with BLE elevated, belt alarm on and all needs in reach at end of session.   ADL ADL Eating: Not assessed Grooming: Not assessed Upper Body Bathing: Contact guard Where Assessed-Upper Body Bathing: Edge of bed Lower Body Bathing: Maximal assistance Where Assessed-Lower Body Bathing: Edge of bed Upper Body Dressing: Minimal assistance Where Assessed-Upper Body Dressing: Edge of bed Lower Body Dressing: Dependent Where Assessed-Lower Body Dressing: Edge of bed Toileting: Dependent (simulated) Where Assessed-Toileting: Bedside Commode Toilet Transfer: Dependent (stedy) Toilet Transfer Method: Other (comment) (stedy) Tub/Shower Transfer: Unable to assess Tub/Shower Transfer Method: Unable to assess Social research officer, government: Unable to assess Social research officer, government Method: Unable to assess Mobility  Bed Mobility Bed Mobility: Right Sidelying to Sit;Sitting - Scoot to Edge of Bed Right Sidelying to Sit: Moderate Assistance - Patient 50-74% Sitting - Scoot to Edge of Bed: Moderate Assistance - Patient 50-74% Transfers Sit to Stand: Moderate Assistance - Patient 50-74%   Discharge Criteria: Patient will be discharged from OT if patient refuses treatment 3 consecutive times without medical reason, if treatment goals not met, if there is a change in medical status, if patient makes no progress towards goals or if patient is discharged from hospital.  The above assessment, treatment plan, treatment alternatives and goals were discussed and mutually agreed upon: by patient  Canisha Issac E Sandeep Radell 06/02/2021, 9:15 AM

## 2021-06-02 NOTE — Progress Notes (Signed)
[place] Individual Statement of Services  Patient Name:  Dalton Miller  Date:  06/02/2021  Welcome to the [facility].  Our goal is to provide you with an individualized program based on your diagnosis and situation, designed to meet your specific needs.  With this comprehensive rehabilitation program, you will be expected to participate in at least 3 hours of rehabilitation therapies Monday-Friday, with modified therapy programming on the weekends.  Your rehabilitation program will include the following services:  Physical Therapy (PT), Occupational Therapy (OT), Speech Therapy (ST), 24 hour per day rehabilitation nursing, Therapeutic Recreaction (TR), Neuropsychology, Care Coordinator, Rehabilitation Medicine, Nutrition Services, Pharmacy Services, and Other  Weekly team conferences will be held on Wednesdays to discuss your progress.  Your Inpatient Rehabilitation Care Coordinator will talk with you frequently to get your input and to update you on team discussions.  Team conferences with you and your family in attendance may also be held.  Expected length of stay:  10-12 days  Overall anticipated outcome: Mod I to Intermittent Assist   Depending on your progress and recovery, your program may change. Your Inpatient Rehabilitation Care Coordinator will coordinate services and will keep you informed of any changes. Your Inpatient Rehabilitation Care Coordinator's name and contact numbers are listed  below.  The following services may also be recommended but are not provided by the [facility]:   [place] will be made to provide these services after discharge if needed.  Arrangements include referral to agencies that provide these services.  Your insurance has been verified to be:   [place] Your primary doctor is:  [facility]  Pertinent information will be shared with your doctor and your insurance company.  Inpatient Rehabilitation Care Coordinator:  Erlene Quan, [place] or [phone]  Information discussed with and copy given to patient by: Dyanne Iha, 06/02/2021, 3:10 PM

## 2021-06-02 NOTE — Progress Notes (Signed)
PROGRESS NOTE   Subjective/Complaints: Complains of left sided hip pain. Has been using his pain medication as needed and does not feel he needs any more. He is willing to try vitamin C supplement  ROS: + left hip pain  Objective:   No results found. Recent Labs    06/01/21 0459 06/02/21 0511  WBC 4.9 4.5  HGB 7.3* 7.6*  HCT 21.5* 22.5*  PLT 88* 98*   Recent Labs    06/01/21 1322 06/02/21 0511  NA 133* 133*  K 4.1 4.0  CL 103 102  CO2 25 23  GLUCOSE 128* 204*  BUN 22 23  CREATININE 0.96 1.05  CALCIUM 8.8* 8.4*    Intake/Output Summary (Last 24 hours) at 06/02/2021 1551 Last data filed at 06/02/2021 1403 Gross per 24 hour  Intake 400 ml  Output 875 ml  Net -475 ml        Physical Exam: Vital Signs Blood pressure (!) 119/59, pulse 76, temperature 98.1 F (36.7 C), resp. rate 18, height 5\' 7"  (1.702 m), weight 71.6 kg, SpO2 98 %.  Vitals and nursing note reviewed.  Constitutional:      Appearance: Normal appearance.     Comments: Sitting up in bed; snoozing off and on; no foley in; appears stated age, NAD, BMI 24.72 HENT:     Head: Normocephalic and atraumatic.     Comments: Mild L facial droop    Right Ear: External ear normal.     Left Ear: External ear normal.     Nose: Nose normal. No congestion.     Mouth/Throat:     Mouth: Mucous membranes are dry.     Pharynx: Oropharynx is clear. No oropharyngeal exudate.  Eyes:     General:        Right eye: No discharge.        Left eye: No discharge.     Extraocular Movements: Extraocular movements intact.  Cardiovascular:     Rate and Rhythm: Normal rate and regular rhythm.     Heart sounds: Normal heart sounds. No murmur heard.   No gallop.  Pulmonary:     Effort: Pulmonary effort is normal. No respiratory distress.     Breath sounds: Rhonchi present. No wheezing or rales.  Abdominal:     Palpations: Abdomen is soft.     Comments: NT, ND,  hypoactive BS- just had BM  Musculoskeletal:        General: Swelling present.     Cervical back: Neck supple. No tenderness.     Comments: RUE 5/5 LUE 4/5 due to previous stroke RLE 5/5 LLE- HF 2/5; 4/5 in KE/DF and PF L hip moderate swelling around incision laterally on L hip  Skin:    General: Skin is warm and dry.     Comments: Multiple healed skin tears on bilateral forearms.  Also has surgical dressing on L lateral hip- moderate bruising and edema- no erythema  Neurological:     Mental Status: He is alert and oriented to person, place, and time.     Comments: Mild left facial weakness with LUE weakness. LLE limited due to pain. He was aware of being at [facility] and  situation but unable to recall day, month or year.  Decreased Orientation as above   Psychiatric:        Mood and Affect: Mood normal.        Behavior: Behavior normal.    Assessment/Plan: 1. Functional deficits which require 3+ hours per day of interdisciplinary therapy in a comprehensive inpatient rehab setting. Physiatrist is providing close team supervision and 24 hour management of active medical problems listed below. Physiatrist and rehab team continue to assess barriers to discharge/monitor patient progress toward functional and medical goals  Care Tool:  Bathing    Body parts bathed by patient: Right arm, Left arm, Chest, Abdomen, Right upper leg, Left upper leg, Face   Body parts bathed by helper: Front perineal area, Buttocks, Right lower leg, Left lower leg     Bathing assist Assist Level: Maximal Assistance - Patient 24 - 49%     Upper Body Dressing/Undressing Upper body dressing   What is the patient wearing?: Pull over shirt    Upper body assist Assist Level: Minimal Assistance - Patient > 75%    Lower Body Dressing/Undressing Lower body dressing      What is the patient wearing?: Incontinence brief, Pants     Lower body assist Assist for lower body dressing: Total Assistance - Patient  < 25% (sit > stand)     Toileting Toileting    Toileting assist Assist for toileting: Total Assistance - Patient < 25% (simulated)     Transfers Chair/bed transfer  Transfers assist     Chair/bed transfer assist level: Dependent - mechanical lift     Locomotion Ambulation   Ambulation assist   Ambulation activity did not occur: Safety/medical concerns          Walk 10 feet activity   Assist  Walk 10 feet activity did not occur: Safety/medical concerns        Walk 50 feet activity   Assist Walk 50 feet with 2 turns activity did not occur: Safety/medical concerns         Walk 150 feet activity   Assist Walk 150 feet activity did not occur: Safety/medical concerns         Walk 10 feet on uneven surface  activity   Assist Walk 10 feet on uneven surfaces activity did not occur: Safety/medical concerns         Wheelchair     Assist Is the patient using a wheelchair?: Yes Type of Wheelchair: Manual    Wheelchair assist level: Minimal Assistance - Patient > 75% Max wheelchair distance: 29'    Wheelchair 50 feet with 2 turns activity    Assist        Assist Level: Moderate Assistance - Patient 50 - 74%   Wheelchair 150 feet activity     Assist      Assist Level: Total Assistance - Patient < 25%   Blood pressure (!) 119/59, pulse 76, temperature 98.1 F (36.7 C), resp. rate 18, height 5\' 7"  (1.702 m), weight 71.6 kg, SpO2 98 %.    Medical Problem List and Plan: 1. Functional deficits secondary to L hip fx in setting of previous L hemiparesis             -patient may  shower             -ELOS/Goals: 10-12 days mod I to supervision  Continue CIR 2.  Impaired mobility and ADLs: start Eliquis 2.5mg  BID 3. Pain Management: Tylenol and/or oxycodone prn.  4.  Mood: LCSW to follow for evaluation and support.              -antipsychotic agents: N/A 5. Neuropsych: This patient is? capable of making decisions on his own  behalf. 6. Skin/Wound Care: Surgical dressing to stay in place for 2 weeks or till f/u with ortho             --routine pressure relief measures.  7. Fluids/Electrolytes/Nutrition: Monitor I/O. Check CMET in am.  --Add juven due to low albumin --2.6 @ admission.              --will order INR also.  8. Cirrhosis of the liver: Thrombocytopenia noted at admission 93-->72-->88. Monitor for signs of bleeding.              --has been refusing lactulose as not taking at home (runs him too much)   --Will check ammonia level in am             --Continue Rifaximin  9. Hyponatremia: Multifactorial. Will recheck in am. 10. T2DM: Hgb A1c- 6.4 and diet controlled.  --Poorly controlled post op likely due to stress and immobility.              --monitor BS ac/hs and use SSI for elevated BS             --change diet to Carb Modified.  11. Constipation: Has not had BM since admission. Has been refusing laxatives per nursing.  12. H/o melanoma/Lung CA w/mets/COPD: Encourage tobacco cessation.             --respiratory status stable. Not on MDI at home.  13. H/o GIB: continue PPI bid.  14. Insomnia: On Trazodone and melatonin.  15. HTN: Monitor BP TID--continue Norvasc daily. Start Flomax 0.4mg  HS 16. Low grade fever: Encourage IS.  17. Urinary retention: Will monitor voiding with PVR checks. Start Flomax 0.4mg  HS             --toilet every 4 hours.  18. L-MCA stroke: On ASA for stroke prophylaxis--resume.  19. Constipation: Had results with suppository after admission             --encouraged patient to take Lactulose for OIC.   LOS: 1 days A FACE TO FACE EVALUATION WAS PERFORMED  Dalton Miller 06/02/2021, 3:51 PM

## 2021-06-02 NOTE — Patient Care Conference (Signed)
Inpatient RehabilitationTeam Conference and Plan of Care Update Date: 06/02/2021   Time: 11:42 AM    Patient Name: Dalton Miller      Medical Record Number: [id number]  Date of Birth: 19-Jun-1945 Sex: Male         Room/Bed: 4W22C/4W22C-01 Payor Info: Payor: [employer] / Plan: [place] / Product Type: *No Product type* /    Admit Date/Time:  06/01/2021 12:58 PM  Primary Diagnosis:  Hip fracture requiring operative repair [facility])  Hospital Problems: Principal Problem:   Hip fracture requiring operative repair [facility])    Expected Discharge Date: Expected Discharge Date:  (evals pending)  Team Members Present: Physician leading conference: Dr. Leeroy Cha Social Worker Present: Erlene Quan, BSW Nurse Present: Dorien Chihuahua, RN PT Present: Ailene Rud, PT OT Present: Jennefer Bravo, OT SLP Present: Charolett Bumpers, SLP PPS Coordinator present : Gunnar Fusi, SLP     Current Status/Progress Goal Weekly Team Focus  Bowel/Bladder   Requires in and out cath q6. Lasat BM 5/30 requiring suppository.  Gain control of bladder function. Be continent of bowel.  Monitor bowel and bladder function.   Swallow/Nutrition/ Hydration             ADL's   max A bathing, min A UB, Total A LB dressing, total A toileting, mod A sit > stand and stedy toilet transfer 2/2 pain  min A per baseline for bathing/dressing with mod I/supervision for transfers  endurance, ADL retraining, functional transfers and tolerance   Mobility   Pending Eval  Pending Eval  Pending eval   Communication             Safety/Cognition/ Behavioral Observations            Pain   Denies pain.  Pain less than or equal to 2.  Assess for pain q shift and prn.   Skin   skin clean dry and intact. L hip foam dressing from incision.  No skin breakdown.  Monitor skin q shift and prn.     Discharge Planning:      Team Discussion: Patient requested not to have lovenox injections for  discharge; MD  switched to Eliquis for DVT prophylaxis. Restarted home med for DM management.   Patient on target to meet rehab goals: yes, currently needs mod assist for sit - stand; unable to weight shift and cannot tolerate WBAT to left leg due to pain and previous CVA deficits. Needs total assist for lower body care and toileting. REquires max assist for upper body care. Goals for discharge set for min assist overall and mod I for ambulation.  *See Care Plan and progress notes for long and short-term goals.   Revisions to Treatment Plan:  N/A   Teaching Needs: Safety, WBAT, medications, skin care, transfers, toileting, etc  Current Barriers to Discharge: Decreased caregiver support  Possible Resolutions to Barriers: Family education     Medical Summary Current Status: refusing Lovenox injections despite education, predaibetes with CBGs >200, left hip pain, urinary retention  Barriers to Discharge: Medical stability;Neurogenic Bowel & Bladder  Barriers to Discharge Comments: refusing Lovenox injections despite education, predaibetes with CBGs >200, left hip pain, urinary retention Possible Resolutions to [employer] Focus: discussed with pharmacy starting Eliquis to 2.5mg  BID, start metfomin 500mg  daily, start multivitamin, start FLomax 0.4mg  HS   Continued Need for Acute Rehabilitation Level of Care: The patient requires daily medical management by a physician with specialized training in physical medicine and rehabilitation for the following  reasons: Direction of a multidisciplinary physical rehabilitation program to maximize functional independence : Yes Medical management of patient stability for increased activity during participation in an intensive rehabilitation regime.: Yes Analysis of laboratory values and/or radiology reports with any subsequent need for medication adjustment and/or medical intervention. : Yes   I attest that I was present, lead the team conference,  and concur with the assessment and plan of the team.   Dorien Chihuahua B 06/02/2021, 4:34 PM

## 2021-06-03 DIAGNOSIS — I10 Essential (primary) hypertension: Secondary | ICD-10-CM | POA: Diagnosis not present

## 2021-06-03 DIAGNOSIS — S72001A Fracture of unspecified part of neck of right femur, initial encounter for closed fracture: Secondary | ICD-10-CM | POA: Diagnosis not present

## 2021-06-03 DIAGNOSIS — R339 Retention of urine, unspecified: Secondary | ICD-10-CM | POA: Diagnosis not present

## 2021-06-03 DIAGNOSIS — Z7409 Other reduced mobility: Secondary | ICD-10-CM | POA: Diagnosis not present

## 2021-06-03 LAB — GLUCOSE, CAPILLARY
Glucose-Capillary: 170 mg/dL — ABNORMAL HIGH (ref 70–99)
Glucose-Capillary: 165 mg/dL — ABNORMAL HIGH (ref 70–99)
Glucose-Capillary: 128 mg/dL — ABNORMAL HIGH (ref 70–99)
Glucose-Capillary: 189 mg/dL — ABNORMAL HIGH (ref 70–99)

## 2021-06-03 LAB — AMMONIA: Ammonia: 19 umol/L (ref 9–35)

## 2021-06-03 MED ORDER — METFORMIN HCL 850 MG PO TABS
850.0000 mg | ORAL_TABLET | Freq: Every day | ORAL | Status: DC
  Administered 2021-06-03 – 2021-06-05 (×3): 850 mg via ORAL
  Filled 2021-06-03 (×3): qty 1

## 2021-06-03 NOTE — Plan of Care (Signed)
  Problem: Consults Goal: RH GENERAL PATIENT EDUCATION Description: See Patient Education module for education specifics. Outcome: Progressing   Problem: RH BOWEL ELIMINATION Goal: RH STG MANAGE BOWEL WITH ASSISTANCE Description: STG Manage Bowel with mod I  Assistance. Outcome: Progressing Goal: RH STG MANAGE BOWEL W/MEDICATION W/ASSISTANCE Description: STG Manage Bowel with Medication with mod I  Assistance. Outcome: Progressing   Problem: RH BLADDER ELIMINATION Goal: RH STG MANAGE BLADDER WITH ASSISTANCE Description: STG Manage Bladder With mod Assistance Outcome: Progressing Goal: RH STG MANAGE BLADDER WITH EQUIPMENT WITH ASSISTANCE Description: STG Manage Bladder With Equipment With mod Assistance Outcome: Progressing   Problem: RH SKIN INTEGRITY Goal: RH STG SKIN FREE OF INFECTION/BREAKDOWN Description: W min assist Outcome: Progressing Goal: RH STG MAINTAIN SKIN INTEGRITY WITH ASSISTANCE Description: STG Maintain Skin Integrity With min Assistance. Outcome: Progressing Goal: RH STG ABLE TO PERFORM INCISION/WOUND CARE W/ASSISTANCE Description: STG Able To Perform Incision/Wound Care With min Assistance. Outcome: Progressing   Problem: RH SAFETY Goal: RH STG ADHERE TO SAFETY PRECAUTIONS W/ASSISTANCE/DEVICE Description: STG Adhere to Safety Precautions With cues Assistance/Device. Outcome: Progressing   Problem: RH PAIN MANAGEMENT Goal: RH STG PAIN MANAGED AT OR BELOW PT'S PAIN GOAL Description: At or below level 4 with prns Outcome: Progressing   Problem: RH KNOWLEDGE DEFICIT GENERAL Goal: RH STG INCREASE KNOWLEDGE OF SELF CARE AFTER HOSPITALIZATION Description: Patient and daughter will be able to manage care at discharge using handouts and educational resources independently Outcome: Progressing

## 2021-06-03 NOTE — Progress Notes (Signed)
Occupational Therapy Session Note  Patient Details  Name: Dalton Miller MRN: [id number] Date of Birth: 03/14/1945  Today's Date: 06/03/2021 OT Individual Time: 1045-1100 OT Individual Time Calculation (min): 15 min  and Today's Date: 06/03/2021 OT Missed Time: 60 Minutes Missed Time Reason: Patient fatigue;Pain   Short Term Goals: Week 1:  OT Short Term Goal 1 (Week 1): Pt will complete sit > stand in prep for ADL with min A using LRAD OT Short Term Goal 2 (Week 1): Pt will utilize LUE at diminished level for ADL tasks with supervision OT Short Term Goal 3 (Week 1): Pt will tolerate at least 2 mins of standing using LRAD and mod A for balance to promote endurance needed for toileting/LB self-care OT Short Term Goal 4 (Week 1): Pt will use AE PRN for threading of BLE into pants with no more than min A for seated balance  Skilled Therapeutic Interventions/Progress Updates:    Pt resting in bed upon arrival. Pt reports increased pain and fatigue from earlier sessions. Pt reports he is worn out from 2 PT sessions earlier in the morning. Discussed spacing out therapies to allow pt time to recover. Pt agreeable. Message relayed to scheduler to space out therapies. Pt missed 60 mins skilled OT services 2/2 pain/fatigue. Will attempt to see pt as time/schedule allows.   Therapy Documentation Precautions:  Precautions Precautions: Fall Restrictions Weight Bearing Restrictions: Yes LLE Weight Bearing: Weight bearing as tolerated General: General OT Amount of Missed Time: 60 Minutes Pain: Pt reports 7/10 pain in LLE; rest   Therapy/Group: Individual Therapy  Leroy Libman 06/03/2021, 11:15 AM

## 2021-06-03 NOTE — Progress Notes (Signed)
Physical Therapy Session Note  Patient Details  Name: Dalton Miller MRN: [id number] Date of Birth: [birth year]  Today's Date: 06/03/2021 PT Individual Time: 4970-2637 PT Individual Time Calculation (min): 71 min   Short Term Goals: Week 1:  PT Short Term Goal 1 (Week 1): Pt will complete bed mobility with min A consistently PT Short Term Goal 2 (Week 1): Pt will complete least restrictive transfer with mod A consistently PT Short Term Goal 3 (Week 1): Pt will initiate gait training PT Short Term Goal 4 (Week 1): Pt will propel w/c x 50 ft with min A  Skilled Therapeutic Interventions/Progress Updates:   Received pt semi-reclined in bed asleep, upon wakening pt reluctantly agreeable to PT treatment, and reported pain 7/10 in L hip - RN notified and administered pain medication. Repositioning, rest breaks, and activity modification done to reduce pain levels - pt also reported fatigue from not sleeping well last night. Session with emphasis on functional mobility/transfers, generalized strengthening and endurance, and dynamic standing balance/coordination. Pt transferred semi-reclined<>sitting EOB with HOB elevated and mod A for LLE management and trunk control. Pt politely refused standing with RW, insisting on Stedy. Sit<>stand in [place] with min A and transferred to [facility] dependently. Pt performed WC mobility 3ft using BUE and RLE with min A towards dayroom but limited by fatigue - difficulty gripping with LUE due to previous CVA. Pt performed seated BLE strengthening on Kinetron at 20 cm/sec for 1 minute x 4 trials with emphasis on glute/quad strengthening and L hip ROM. With encouragement, pt agreed to work on sit<>stands from mat with RW. Pt transferred WC<>mat via lateral scoot to R with close supervision, adjusted RW, and worked on sit<>stands x 5 from elevated mat with RW and min A. Of note, pt hesitant to place full weight on LLE due to fear of "buckling" and pain. Attempted to take steps with cues  to place more weight through UEs>LEs, however pt unable due to pain. Pt then performed the following exercises sitting on mat with emphasis on LLE strength/ROM: -LLE heel slides 2x10 AAROM on towel -LLE LAQ 2x10 - decreased ROM -hip adduction ball squeezes 2x12 Pt transferred mat<>WC stand<>pivot with RW and min A (from elevated mat). Pt transported back to room in [facility] dependently and requested to return to bed. Stand<>pivot WC<>bed to L with RW and mod A and sit<>supine with min A for LLE management. Concluded session with pt semi-reclined in bed, needs within reach, and bed alarm on. Provided pt with ice pack to L hip.   Therapy Documentation Precautions:  Precautions Precautions: Fall Restrictions Weight Bearing Restrictions: Yes LLE Weight Bearing: Weight bearing as tolerated  Therapy/Group: Individual Therapy Alfonse Alpers PT, DPT  06/03/2021, 6:49 AM

## 2021-06-03 NOTE — Progress Notes (Signed)
Physical Therapy Session Note  Patient Details  Name: Dalton Miller MRN: [id number] Date of Birth: 05-15-1945  Today's Date: 06/03/2021 PT Individual Time: 0930-1015 PT Individual Time Calculation (min): 45 min   Short Term Goals: Week 1:  PT Short Term Goal 1 (Week 1): Pt will complete bed mobility with min A consistently PT Short Term Goal 2 (Week 1): Pt will complete least restrictive transfer with mod A consistently PT Short Term Goal 3 (Week 1): Pt will initiate gait training PT Short Term Goal 4 (Week 1): Pt will propel w/c x 50 ft with min A  Skilled Therapeutic Interventions/Progress Updates:    Pt received seated in bed, agreeable to PT session. Pt reports 6/10 pain in L hip at rest, premedicated prior to start of therapy session and utilizing ice pack for pain management. Supine to sit with mod A needed for LLE management and some trunk elevation, improved from previous date. Lateral scoot transfer bed to w/c with mod A to the R with assist needed for anterior weight shift and clearing buttocks during transfer. Dependent transport via w/c to/from therapy gym for time and energy conservation. Lateral scoot transfer to the L to mat table with max A needed due to increased pain and difficulty with transferring this direction. Lateral scoot transfer back to w/c to the R with mod A, back to bed with max A to the L. Sit to supine mod A needed for BLE management. Encouraged pt to engage in LLE stretching/ROM due to mm tightness and guarding, pt declines at this time due to pain. Pt left seated in bed with needs in reach, bed alarm in place, fresh ice pack to L hip.  Therapy Documentation Precautions:  Precautions Precautions: Fall Restrictions Weight Bearing Restrictions: Yes LLE Weight Bearing: Weight bearing as tolerated       Therapy/Group: Individual Therapy   Excell Seltzer, PT, DPT, CSRS 06/03/2021, 11:57 AM

## 2021-06-03 NOTE — Progress Notes (Signed)
PROGRESS NOTE   Subjective/Complaints: Complains of hip pain after working with therapy this morning. About to receive pain medication Urinating well on own now  ROS: + left hip pain, +voiding well  Objective:   No results found. Recent Labs    06/01/21 0459 06/02/21 0511  WBC 4.9 4.5  HGB 7.3* 7.6*  HCT 21.5* 22.5*  PLT 88* 98*   Recent Labs    06/01/21 1322 06/02/21 0511  NA 133* 133*  K 4.1 4.0  CL 103 102  CO2 25 23  GLUCOSE 128* 204*  BUN 22 23  CREATININE 0.96 1.05  CALCIUM 8.8* 8.4*    Intake/Output Summary (Last 24 hours) at 06/03/2021 0919 Last data filed at 06/03/2021 3212 Gross per 24 hour  Intake 760 ml  Output 1500 ml  Net -740 ml        Physical Exam: Vital Signs Blood pressure 116/70, pulse 78, temperature 98.7 F (37.1 C), temperature source Oral, resp. rate 18, height 5\' 7"  (1.702 m), weight 71.6 kg, SpO2 93 %.  Vitals and nursing note reviewed.  Constitutional:      Appearance: Normal appearance.     Comments: Sitting up in bed; snoozing off and on; no foley in; appears stated age, NAD, BMI 24.72 HENT:     Head: Normocephalic and atraumatic.     Comments: Mild L facial droop    Right Ear: External ear normal.     Left Ear: External ear normal.     Nose: Nose normal. No congestion.     Mouth/Throat:     Mouth: Mucous membranes are dry.     Pharynx: Oropharynx is clear. No oropharyngeal exudate.  Eyes:     General:        Right eye: No discharge.        Left eye: No discharge.     Extraocular Movements: Extraocular movements intact.  Cardiovascular:     Rate and Rhythm: Normal rate and regular rhythm.     Heart sounds: Normal heart sounds. No murmur heard.   No gallop.  Pulmonary:     Effort: Pulmonary effort is normal. No respiratory distress.     Breath sounds: Rhonchi present. No wheezing or rales.  Abdominal:     Palpations: Abdomen is soft.     Comments: NT, ND,  hypoactive BS- just had BM  Musculoskeletal:        General: Swelling present.     Cervical back: Neck supple. No tenderness.     Comments: RUE 5/5 LUE 4/5 due to previous stroke RLE 5/5 LLE- HF 2/5; 4/5 in KE/DF and PF L hip moderate swelling around incision laterally on L hip  Skin:    General: Skin is warm and dry.     Comments: Multiple healed skin tears on bilateral forearms.  Also has surgical dressing on L lateral hip- moderate bruising and edema- no erythema  Neurological:     Mental Status: He is alert and oriented to person, place, and time.     Comments: Mild left facial weakness with LUE weakness. LLE limited due to pain. He was aware of being at [facility] and situation but unable to recall day,  month or year.  Decreased Orientation as above   Psychiatric:        Mood and Affect: Mood normal.        Behavior: Behavior normal. Fear of pain    Assessment/Plan: 1. Functional deficits which require 3+ hours per day of interdisciplinary therapy in a comprehensive inpatient rehab setting. Physiatrist is providing close team supervision and 24 hour management of active medical problems listed below. Physiatrist and rehab team continue to assess barriers to discharge/monitor patient progress toward functional and medical goals  Care Tool:  Bathing    Body parts bathed by patient: Right arm, Left arm, Chest, Abdomen, Right upper leg, Left upper leg, Face   Body parts bathed by helper: Front perineal area, Buttocks, Right lower leg, Left lower leg     Bathing assist Assist Level: Maximal Assistance - Patient 24 - 49%     Upper Body Dressing/Undressing Upper body dressing   What is the patient wearing?: Pull over shirt    Upper body assist Assist Level: Minimal Assistance - Patient > 75%    Lower Body Dressing/Undressing Lower body dressing      What is the patient wearing?: Incontinence brief, Pants     Lower body assist Assist for lower body dressing: Total  Assistance - Patient < 25% (sit > stand)     Toileting Toileting    Toileting assist Assist for toileting: Total Assistance - Patient < 25% (simulated)     Transfers Chair/bed transfer  Transfers assist     Chair/bed transfer assist level: Moderate Assistance - Patient 50 - 74%     Locomotion Ambulation   Ambulation assist   Ambulation activity did not occur: Safety/medical concerns          Walk 10 feet activity   Assist  Walk 10 feet activity did not occur: Safety/medical concerns        Walk 50 feet activity   Assist Walk 50 feet with 2 turns activity did not occur: Safety/medical concerns         Walk 150 feet activity   Assist Walk 150 feet activity did not occur: Safety/medical concerns         Walk 10 feet on uneven surface  activity   Assist Walk 10 feet on uneven surfaces activity did not occur: Safety/medical concerns         Wheelchair     Assist Is the patient using a wheelchair?: Yes Type of Wheelchair: Manual    Wheelchair assist level: Minimal Assistance - Patient > 75% Max wheelchair distance: 30ft    Wheelchair 50 feet with 2 turns activity    Assist        Assist Level: Moderate Assistance - Patient 50 - 74%   Wheelchair 150 feet activity     Assist      Assist Level: Total Assistance - Patient < 25%   Blood pressure 116/70, pulse 78, temperature 98.7 F (37.1 C), temperature source Oral, resp. rate 18, height 5\' 7"  (1.702 m), weight 71.6 kg, SpO2 93 %.    Medical Problem List and Plan: 1. Functional deficits secondary to L hip fx in setting of previous L hemiparesis             -patient may  shower             -ELOS/Goals: 10-12 days mod I to supervision  Continue CIR 2.  Impaired mobility and ADLs: start Eliquis 2.5mg  BID. Discussed with patient.  3. Left hip  pain: Tylenol and/or oxycodone prn. Discussed scheduling Tylenol but he does not find tylenol helpful 4. Mood: LCSW to follow for  evaluation and support.              -antipsychotic agents: N/A 5. Neuropsych: This patient is? capable of making decisions on his own behalf. 6. Skin/Wound Care: Surgical dressing to stay in place for 2 weeks or till f/u with ortho             --routine pressure relief measures.  7. Fluids/Electrolytes/Nutrition: Monitor I/O. Check CMET in am.  --Add juven due to low albumin --2.6 @ admission.              --will order INR also.  8. Cirrhosis of the liver: Thrombocytopenia noted at admission 93-->72-->88. Monitor for signs of bleeding.              --discussed that Ammonia is stable. Continue lactulose             --Continue Rifaximin  9. Hyponatremia: Multifactorial. Will recheck in am. 10. T2DM: Hgb A1c- 6.4 and diet controlled.  --Poorly controlled post op likely due to stress and immobility.              --monitor BS ac/hs and use SSI for elevated BS             --change diet to Carb Modified.  11. Constipation: Has not had BM since admission. Has been refusing laxatives per nursing.  12. H/o melanoma/Lung CA w/mets/COPD: Encourage tobacco cessation.             --respiratory status stable. Not on MDI at home.  13. H/o GIB: continue PPI bid.  14. Insomnia: On Trazodone and melatonin.  15. HTN: Monitor BP TID--continue Norvasc daily. Start Flomax 0.4mg  HS 16. Low grade fever: Encourage IS.  17. Urinary retention: Will monitor voiding with PVR checks. Start Flomax 0.4mg  HS             --toilet every 4 hours.  18. L-MCA stroke: On ASA for stroke prophylaxis--resume.  19. Constipation: Had results with suppository after admission             --encouraged patient to take Lactulose for OIC.   LOS: 2 days A FACE TO FACE EVALUATION WAS PERFORMED  Martha Clan P Alyiah Ulloa 06/03/2021, 9:19 AM

## 2021-06-03 NOTE — Progress Notes (Signed)
Inpatient Rehabilitation Care Coordinator Assessment and Plan Patient Details  Name: Dalton Miller MRN: [id number] Date of Birth: 1945-11-09  Today's Date: 06/03/2021  Hospital Problems: Principal Problem:   Hip fracture requiring operative repair [facility])  Past Medical History:  Past Medical History:  Diagnosis Date   Acquired renal cyst of right kidney    Arthritis    Cancer ([place])    renal cell    Cirrhosis ([place])    Erectile dysfunction    Heart murmur    CHILDHOOD   Hematuria    left ureteral bleeding   History of cellulitis    2012 left wrist   History of kidney stones    Hyperlipidemia    Hypertension    Metastatic melanoma to lung, left ([place])    Nephrolithiasis    bilateral non-obstructive per ct 07-02-2015   Type 2 diabetes mellitus ([place])    Wears glasses    Past Surgical History:  Past Surgical History:  Procedure Laterality Date   CARPAL TUNNEL RELEASE Right 1990  approx   COLONOSCOPY  03/04/2010   CYSTOSCOPY W/ RETROGRADES Left 10/28/2015   Procedure: CYSTOSCOPY WITH LEFT RETROGRADE PYELOGRAM;  Surgeon: Cleon Gustin, MD;  Location: [facility];  Service: Urology;  Laterality: Left;   CYSTOSCOPY W/ URETERAL STENT PLACEMENT Left 10/28/2015   Procedure: CYSTOSCOPY WITH LEFT URETERAL STENT EXCHANGE;  Surgeon: Cleon Gustin, MD;  Location: [facility];  Service: Urology;  Laterality: Left;   CYSTOSCOPY WITH RETROGRADE PYELOGRAM, URETEROSCOPY AND STENT PLACEMENT Bilateral 09/17/2015   Procedure: CYSTOSCOPY WITH BILATERAL RETROGRADE PYELOGRAM, LEFT URETEROSCOPY WITH URETERAL BIOPSY AND  STENT PLACEMENT;  Surgeon: Irine Seal, MD;  Location: [facility];  Service: Urology;  Laterality: Bilateral;   ESOPHAGEAL BANDING  12/30/2020   Procedure: ESOPHAGEAL BANDING;  Surgeon: Ladene Artist, MD;  Location: [facility] ENDOSCOPY;  Service: Endoscopy;;   ESOPHAGOGASTRODUODENOSCOPY (EGD) WITH PROPOFOL N/A 12/30/2020   Procedure: ESOPHAGOGASTRODUODENOSCOPY (EGD) WITH  PROPOFOL;  Surgeon: Ladene Artist, MD;  Location[facility] END[facility];  Service: Endoscopy;  Laterality: N/A;   HOLMIUM LASER APPLICATION Left 03/54/6568   Procedure: HOLMIUM LASER APPLICATION;  Surgeon: Irine Seal, MD;  Location: [facility];  Service: Urology;  Laterality: Left;   INTRAMEDULLARY (IM) NAIL INTERTROCHANTERIC Left 05/28/2021   Procedure: INTRAMEDULLARY (IM) NAIL INTERTROCHANTRIC;  Surgeon: Shona Needles, MD;  Location: [place];  Service: Orthopedics;  Laterality: Left;   KNEE ARTHROSCOPY Right 2008   MELANOMA EXCISION  2018   Left arm   right total knee replacement  Right    2009   ROBOT ASSITED LAPAROSCOPIC NEPHROURETERECTOMY Left 12/04/2015   Procedure: XI ROBOT ASSITED LAPAROSCOPIC NEPHROURETERECTOMY;  Surgeon: Cleon Gustin, MD;  Location: [facility];  Service: Urology;  Laterality: Left;   ROTATOR CUFF REPAIR Right    SHOULDER ARTHROSCOPY Right 12/17/2020   Procedure: Right shoulder arthroscopy, debridement, subacromial decompression, distal clavicle resection;  Surgeon: Justice Britain, MD;  Location: [facility];  Service: Orthopedics;  Laterality: Right;  76mn   STONE EXTRACTION WITH BASKET Left 09/17/2015   Procedure: STONE EXTRACTION WITH BASKET;  Surgeon: JIrine Seal MD;  [facility]  Service: Urology;  Laterality: Left;   TOTAL KNEE ARTHROPLASTY Right 2009   URETEROSCOPY  10/28/2015   Procedure: DIAGNOSTIC LEFT URETEROSCOPY;  Surgeon: PCleon Gustin MD;  Location: [facility];  Service: Urology;;   WISDOM TOOTH EXTRACTION     Social History:  reports that he has been smoking cigars and cigarettes. He has  a 63.00 pack-year smoking history. He has been exposed to tobacco smoke. He quit smokeless tobacco use about 28 years ago.  His smokeless tobacco use included chew. He reports that he does not drink alcohol and does not use drugs.  Family / Support Systems Children: Marine scientist (daughter), Shanon Brow (Son), Dewayne (Son) Anticipated Caregiver:  Dewayne (Son) Ability/Limitations of Caregiver: none Caregiver Availability: 24/7 Family Dynamics: support from children  Social History Preferred language: English Religion: None Health Literacy - How often do you need to have someone help you when you read instructions, pamphlets, or other written material from your doctor or pharmacy?: Never   Abuse/Neglect Abuse/Neglect Assessment Can Be Completed: Yes Physical Abuse: Denies Verbal Abuse: Denies Sexual Abuse: Denies Exploitation of patient/patient's resources: Denies Self-Neglect: Denies  Patient response to: Social Isolation - How often do you feel lonely or isolated from those around you?: Never  Emotional Status Recent Psychosocial Issues: coping Psychiatric History: n/a Substance Abuse History: n/a  Patient / Family Perceptions, Expectations & Goals Pt/Family understanding of illness & functional limitations: yes Premorbid pt/family roles/activities: previously limited in the community. MOF I with RW and no AD at times Anticipated changes in roles/activities/participation: children able to assist if needed Pt/family expectations/goals: MOD I to Intermittent A  Community Resources Premorbid Home Care/DME Agencies: Other (Comment) ([facility], [facility], RW and shower seat) Transportation available at discharge: family able to transport Is the patient able to respond to transportation needs?: Yes In the past 12 months, has lack of transportation kept you from medical appointments or from getting medications?: No In the past 12 months, has lack of transportation kept you from meetings, work, or from getting things needed for daily living?: No Resource referrals recommended: Neuropsychology  Discharge Planning Living Arrangements: Children Support Systems: Spouse/significant other Type of Residence: Private residence Insurance Resources: Multimedia programmer (specify) Financial Resources: Family Support Financial Screen Referred:  No Living Expenses: Lives with family Money Management: Family Does the patient have any problems obtaining your medications?: Yes (Describe) Home Management: Family assisting patient Patient/Family Preliminary Plans: Will continue Care Coordinator Anticipated Follow Up Needs: HH/OP Expected length of stay: 10-12 Days  Clinical Impression SW met with patient introduced self and explained role. Patient attempting to nap sw will continue to follow up.   Dyanne Iha 06/03/2021, 12:43 PM

## 2021-06-04 DIAGNOSIS — I10 Essential (primary) hypertension: Secondary | ICD-10-CM | POA: Diagnosis not present

## 2021-06-04 DIAGNOSIS — S72001A Fracture of unspecified part of neck of right femur, initial encounter for closed fracture: Secondary | ICD-10-CM | POA: Diagnosis not present

## 2021-06-04 DIAGNOSIS — Z7409 Other reduced mobility: Secondary | ICD-10-CM | POA: Diagnosis not present

## 2021-06-04 DIAGNOSIS — R339 Retention of urine, unspecified: Secondary | ICD-10-CM | POA: Diagnosis not present

## 2021-06-04 LAB — GLUCOSE, CAPILLARY
Glucose-Capillary: 131 mg/dL — ABNORMAL HIGH (ref 70–99)
Glucose-Capillary: 236 mg/dL — ABNORMAL HIGH (ref 70–99)
Glucose-Capillary: 125 mg/dL — ABNORMAL HIGH (ref 70–99)
Glucose-Capillary: 186 mg/dL — ABNORMAL HIGH (ref 70–99)

## 2021-06-04 NOTE — Progress Notes (Signed)
Occupational Therapy Session Note  Patient Details  Name: Dalton Miller MRN: [id number] Date of Birth: [birth year]  Today's Date: 06/04/2021 OT Individual Time: 1120-1200 & 1331-1427 OT Individual Time Calculation (min): 40 min & 56 min   Short Term Goals: Week 1:  OT Short Term Goal 1 (Week 1): Pt will complete sit > stand in prep for ADL with min A using LRAD OT Short Term Goal 2 (Week 1): Pt will utilize LUE at diminished level for ADL tasks with supervision OT Short Term Goal 3 (Week 1): Pt will tolerate at least 2 mins of standing using LRAD and mod A for balance to promote endurance needed for toileting/LB self-care OT Short Term Goal 4 (Week 1): Pt will use AE PRN for threading of BLE into pants with no more than min A for seated balance  Skilled Therapeutic Interventions/Progress Updates:  Session 1 Skilled OT intervention completed with focus on standing tolerance, unilateral UE support dynamic balance, and functional transfers. Pt received seated in w/c, 7/10 pain in LLE, pre-medicated, provided rest breaks, therapeutic distractions through conversation about fond memories, and ice provided for pain reduction. Transported dependently in w/c <> gym. To promote endurance, weight shifting of BLE and dynamic balance needed for toileting and LB tasks in stance, pt participated in horse shoe and bean bag toss activity alternating R and L hand. CGA needed to stand using RW, then CGA for balance, with education provided about using R hand to do ADL tasks as it has more strength and using LUE for stability as well as to maximize balance on the strong RLE. Pt able to tolerate stance position for >5 mins. Completed sit > stand and stand pivot using RW with CGA to EOB, however requiring slow transition for pain management. Mod A needed for bed mobility for LLE, with pt able to reposition in bed with min A. Ice applied to L hip for pain, BLE elevated for edema management. Pt was left upright in bed, with  bed alarm on and all needs in reach at end of session.   Session 2 Skilled OT intervention completed with focus on functional endurance via shower, ADL retraining, activity tolerance. Pt received supine in bed, asleep, easily woken, agreeable to session. 4/10 pain reported in L hip, however improvement compared to this AM, with shower utilized as pain reduction technique and ice applied at end of session as well. Completed bed mobility with mod A for trunk and LLE. Sit > stand using RW with min A then stand pivot with CGA using RW to w/c. Dependent transfer to shower, then Min A stand pivot and side step transfer with use of grab bars to shower chair. Cues needed for weight shifting BLE, and hand placements for safety. Covered surgical bandages on L hip per MD/nursing with waterproof cover. Bathed with overall min A for buttocks and feet at the sit > stand level with CGA for balance. Able to side step using grab bar with min A, then transfer to RW with cues provided on technique, then 5 step ambulatory transfer to w/c in bathroom with mod A. Donned shirt with min A, LB with overall mod A this session, with assist for threading over BLE, however pt able to donn over hips in stance with min A for balance and over hips in back. Pt able to tie pants in front with BUE with no UE on RW with min A for balance. Stand pivot with CGA to EOB, with mod A for back into  bed. Applied ice to L hip for pain reduction, then pt was left upright in bed, with NT present at direct care handoff.   Therapy Documentation Precautions:  Precautions Precautions: Fall Restrictions Weight Bearing Restrictions: Yes LLE Weight Bearing: Weight bearing as tolerated    Therapy/Group: Individual Therapy  Chealsea Paske E Camilia Caywood 06/04/2021, 7:38 AM

## 2021-06-04 NOTE — Progress Notes (Signed)
Physical Therapy Session Note  Patient Details  Name: Dalton Miller MRN: [id number] Date of Birth: 1945-01-12  Today's Date: 06/04/2021 PT Individual Time: [id number] and [id number] PT Individual Time Calculation (min): 57 min and 28 min  Short Term Goals: Week 1:  PT Short Term Goal 1 (Week 1): Pt will complete bed mobility with min A consistently PT Short Term Goal 2 (Week 1): Pt will complete least restrictive transfer with mod A consistently PT Short Term Goal 3 (Week 1): Pt will initiate gait training PT Short Term Goal 4 (Week 1): Pt will propel w/c x 50 ft with min A  Skilled Therapeutic Interventions/Progress Updates:   Treatment Session 1 Received pt semi-reclined in bed just waking up. Pt agreeable to PT treatment and reported pain 6/10 in L hip. RN arrived to administer pain medication and repositioning, rest breaks, and distraction done to reduce pain levels. Session with emphasis on functional mobility/transfers, toileting, generalized strengthening and endurance, and dynamic standing balance/coordination. Pt transferred semi-reclined<>sitting EOB with HOB elevated and use of bedrails with mod A for LLE management and trunk control with cues for logroll technique. Pt reported urge to toilet and transferred bed<>bedside commode stand<>pivot from elevated bed with RW and min A - pt able to manage clothing with CGA. Pt able to void and stood from commode with RW and min A and pulled up WC for pt to sit in. Pt transported to/from room in [facility] dependently for time management purposes. Pt politely declined ambulating this morning due to pain but agreed to work on sit<>stands. Pt transferred WC<>mat stand<>pivot with RW and min A (mod A to stand). Worked on blocked practice sit<>stands 2x6 reps from elevated mat with RW and CGA/light min A with emphasis on bearing weight on LLE. Stand<>pivot mat<>WC with RW and min A. Concluded session with pt sitting in WC, needs within reach, and seatbelt alarm on  eating breakfast.   Treatment Session 2 Received pt sitting in [facility] requesting cup of coffee. Provided pt with coffee and pt agreeable to PT treatment and reported pain 7/10 in L hip (premedicated). Session with emphasis on generalized strengthening and endurance. Pt performed the following exercises sitting in WC with supervision and verbal cues for technique with emphasis on LE strength/ROM: -LLE LAQ 2x10 -hip adduction pillow squeezes 2x15 -hip abduction with red TB 2x10 -LLE hamstring curls with red TB 2x10 -LLE PF with red TB 2x15 Concluded session with pt sitting in WC, needs within reach, and seatbelt alarm on. Provided pt with ice pack for L hip for pain relief.   Therapy Documentation Precautions:  Precautions Precautions: Fall Restrictions Weight Bearing Restrictions: Yes LLE Weight Bearing: Weight bearing as tolerated  Therapy/Group: Individual Therapy Alfonse Alpers PT, DPT  06/04/2021, 6:58 AM

## 2021-06-04 NOTE — Progress Notes (Signed)
PROGRESS NOTE   Subjective/Complaints: Feeling sore in left hip after working with therapy. Does not find Tylenol helpful. Opioid helps. Does not feel that he requires a higher dose  ROS: + left hip pain, +voiding well, sleeping well  Objective:   No results found. Recent Labs    06/02/21 0511  WBC 4.5  HGB 7.6*  HCT 22.5*  PLT 98*   Recent Labs    06/01/21 1322 06/02/21 0511  NA 133* 133*  K 4.1 4.0  CL 103 102  CO2 25 23  GLUCOSE 128* 204*  BUN 22 23  CREATININE 0.96 1.05  CALCIUM 8.8* 8.4*    Intake/Output Summary (Last 24 hours) at 06/04/2021 1020 Last data filed at 06/04/2021 0245 Gross per 24 hour  Intake 720 ml  Output 1250 ml  Net -530 ml        Physical Exam: Vital Signs Blood pressure 116/76, pulse 73, temperature 98.8 F (37.1 C), resp. rate 16, height 5\' 7"  (1.702 m), weight 71.6 kg, SpO2 94 %.  Vitals and nursing note reviewed.  Constitutional:      Appearance: Normal appearance.     Comments: Sitting up in bed; snoozing off and on; no foley in; appears stated age, NAD, BMI 24.72. Fatigued HENT:     Head: Normocephalic and atraumatic.     Comments: Mild L facial droop    Right Ear: External ear normal.     Left Ear: External ear normal.     Nose: Nose normal. No congestion.     Mouth/Throat:     Mouth: Mucous membranes are dry.     Pharynx: Oropharynx is clear. No oropharyngeal exudate.  Eyes:     General:        Right eye: No discharge.        Left eye: No discharge.     Extraocular Movements: Extraocular movements intact.  Cardiovascular:     Rate and Rhythm: Normal rate and regular rhythm.     Heart sounds: Normal heart sounds. No murmur heard.   No gallop.  Pulmonary:     Effort: Pulmonary effort is normal. No respiratory distress.     Breath sounds: Rhonchi present. No wheezing or rales.  Abdominal:     Palpations: Abdomen is soft.     Comments: NT, ND, hypoactive BS- just  had BM  Musculoskeletal:        General: Swelling present.     Cervical back: Neck supple. No tenderness.     Comments: RUE 5/5 LUE 4/5 due to previous stroke RLE 5/5 LLE- HF 2/5; 4/5 in KE/DF and PF L hip moderate swelling around incision laterally on L hip  Skin:    General: Skin is warm and dry.     Comments: Multiple healed skin tears on bilateral forearms.  Also has surgical dressing on L lateral hip- moderate bruising and edema- no erythema  Neurological:     Mental Status: He is alert and oriented to person, place, and time.     Comments: Mild left facial weakness with LUE weakness. LLE limited due to pain. He was aware of being at [facility] and situation but unable to recall day, month  or year.  Decreased Orientation as above   Psychiatric:        Mood and Affect: Mood normal.        Behavior: Behavior normal. Fear of pain    Assessment/Plan: 1. Functional deficits which require 3+ hours per day of interdisciplinary therapy in a comprehensive inpatient rehab setting. Physiatrist is providing close team supervision and 24 hour management of active medical problems listed below. Physiatrist and rehab team continue to assess barriers to discharge/monitor patient progress toward functional and medical goals  Care Tool:  Bathing    Body parts bathed by patient: Right arm   Body parts bathed by helper: Front perineal area     Bathing assist Assist Level: Maximal Assistance - Patient 24 - 49%     Upper Body Dressing/Undressing Upper body dressing   What is the patient wearing?: Pull over shirt    Upper body assist Assist Level: Minimal Assistance - Patient > 75%    Lower Body Dressing/Undressing Lower body dressing      What is the patient wearing?: Incontinence brief, Pants     Lower body assist Assist for lower body dressing: Total Assistance - Patient < 25%     Toileting Toileting    Toileting assist Assist for toileting: Total Assistance - Patient < 25%  (simulated)     Transfers Chair/bed transfer  Transfers assist     Chair/bed transfer assist level: Minimal Assistance - Patient > 75%     Locomotion Ambulation   Ambulation assist   Ambulation activity did not occur: Safety/medical concerns          Walk 10 feet activity   Assist  Walk 10 feet activity did not occur: Safety/medical concerns        Walk 50 feet activity   Assist Walk 50 feet with 2 turns activity did not occur: Safety/medical concerns         Walk 150 feet activity   Assist Walk 150 feet activity did not occur: Safety/medical concerns         Walk 10 feet on uneven surface  activity   Assist Walk 10 feet on uneven surfaces activity did not occur: Safety/medical concerns         Wheelchair     Assist Is the patient using a wheelchair?: Yes Type of Wheelchair: Manual    Wheelchair assist level: Minimal Assistance - Patient > 75% Max wheelchair distance: 74ft    Wheelchair 50 feet with 2 turns activity    Assist        Assist Level: Moderate Assistance - Patient 50 - 74%   Wheelchair 150 feet activity     Assist      Assist Level: Total Assistance - Patient < 25%   Blood pressure 116/76, pulse 73, temperature 98.8 F (37.1 C), resp. rate 16, height 5\' 7"  (1.702 m), weight 71.6 kg, SpO2 94 %.    Medical Problem List and Plan: 1. Functional deficits secondary to L hip fx in setting of previous L hemiparesis             -patient may  shower             -ELOS/Goals: 10-12 days mod I to supervision  Continue CIR 2.  Impaired mobility and ADLs: continue Eliquis 2.5mg  BID. Discussed with patient.  3. Left hip pain: Tylenol and/or oxycodone prn. Discussed scheduling Tylenol but he does not find tylenol helpful 4. Mood: LCSW to follow for evaluation and support.              -  antipsychotic agents: N/A 5. Neuropsych: This patient is? capable of making decisions on his own behalf. 6. Skin/Wound Care:  Surgical dressing to stay in place for 2 weeks or till f/u with ortho             --routine pressure relief measures.  7. Hypoalbuminemia: d/c Juven given hyperglycemia. Encourage high protein foods.   8. Cirrhosis of the liver: Thrombocytopenia noted at admission 93-->72-->88. Monitor for signs of bleeding.              --discussed that Ammonia is stable. Continue lactulose             --continue Rifaximin  9. Hyponatremia: Multifactorial. Will recheck in am. 10. T2DM: Hgb A1c- 6.4 and diet controlled.  --Poorly controlled post op likely due to stress and immobility.              --monitor BS ac/hs and use SSI for elevated BS             --change diet to Carb Modified.  11. Constipation: Has not had BM since admission. Has been refusing laxatives per nursing.  12. H/o melanoma/Lung CA w/mets/COPD: Encourage tobacco cessation.             --respiratory status stable. Not on MDI at home.  13. H/o GIB: continue PPI bid.  14. Insomnia: On Trazodone and melatonin.  15. HTN: Monitor BP TID--continue Norvasc daily. Start Flomax 0.4mg  HS 16. Low grade fever: Encourage IS.  17. Urinary retention: Will monitor voiding with PVR checks. Continue Flomax 0.4mg  HS             --toilet every 4 hours.  18. L-MCA stroke: On ASA for stroke prophylaxis--resume.  19. Constipation: Had results with suppository after admission             --encouraged patient to take Lactulose for OIC.   LOS: 3 days A FACE TO FACE EVALUATION WAS PERFORMED  Leola Fiore P Miriah Maruyama 06/04/2021, 10:20 AM

## 2021-06-04 NOTE — IPOC Note (Signed)
Overall Plan of Care [facility]) Patient Details Name: Dalton Miller MRN: [id number] DOB: 1945/03/15  Admitting Diagnosis: Hip fracture requiring operative repair [facility])  Hospital Problems: Principal Problem:   Hip fracture requiring operative repair [facility])     Functional Problem List: Nursing Bowel, Bladder, Safety, Medication Management, Pain, Endurance  PT Balance, Endurance, Pain, Safety  OT Balance, Edema, Endurance, Motor, Pain, Safety, Vision  SLP    TR         Basic ADL's: OT Grooming, Dressing, Bathing, Toileting     Advanced  ADL's: OT       Transfers: PT Bed Mobility, Bed to Chair, Car, Furniture, Floor  OT Toilet     Locomotion: PT Ambulation, Emergency planning/management officer, Stairs     Additional Impairments: OT Fuctional Use of Upper Extremity  SLP        TR      Anticipated Outcomes Item Anticipated Outcome  Self Feeding Mod I  Swallowing      Basic self-care  Min A  Toileting  Min A   Bathroom Transfers CGA  Bowel/Bladder  manage bowel w mod I and bladder w mod assist  Transfers  min A  Locomotion  min A at w/c level with short distance ambulation  Communication     Cognition     Pain  pain at or below level 4 with prns  Safety/Judgment  maintain safety w cues   Therapy Plan: PT Intensity: Minimum of 1-2 x/day ,45 to 90 minutes PT Frequency: 5 out of 7 days PT Duration Estimated Length of Stay: 14-16 days OT Intensity: Minimum of 1-2 x/day, 45 to 90 minutes OT Frequency: 5 out of 7 days OT Duration/Estimated Length of Stay: 2 weeks     Team Interventions: Nursing Interventions Patient/Family Education, Bowel Management, Pain Management, Skin Care/Wound Management, Discharge Planning, Medication Management, Disease Management/Prevention, Bladder Management  PT interventions Ambulation/gait training, Balance/vestibular training, Community reintegration, Discharge planning, Disease management/prevention, DME/adaptive equipment instruction,  Functional mobility training, Neuromuscular re-education, Pain management, Patient/family education, Therapeutic Activities, Therapeutic Exercise, UE/LE Strength taining/ROM, UE/LE Coordination activities, Wheelchair propulsion/positioning  OT Interventions Balance/vestibular training, Discharge planning, Functional electrical stimulation, Pain management, Self Care/advanced ADL retraining, Therapeutic Activities, UE/LE Coordination activities, Functional mobility training, Patient/family education, Therapeutic Exercise, Visual/perceptual remediation/compensation, Community reintegration, Engineer, drilling, Neuromuscular re-education, Splinting/orthotics, UE/LE Strength taining/ROM, Wheelchair propulsion/positioning  SLP Interventions    TR Interventions    SW/CM Interventions Discharge Planning, Psychosocial Support, Patient/Family Education, Disease Management/Prevention   Barriers to Discharge MD  Medical stability  Nursing Decreased caregiver support, Home environment access/layout, Neurogenic Bowel & Bladder level entry mobile home w dtr  PT Other (comments) (pain)    OT Home environment access/layout, Incontinence    SLP      SW       Team Discharge Planning: Destination: PT-Home ,OT- Home , SLP-  Projected Follow-up: PT-Home health PT, 24 hour supervision/assistance, OT-  Home health OT, SLP-  Projected Equipment Needs: PT-Wheelchair (measurements), Wheelchair cushion (measurements), OT- To be determined, SLP-  Equipment Details: PT-TBD pending progress, OT-  Patient/family involved in discharge planning: PT- Patient,  OT-Patient, SLP-   MD ELOS: 10-12 days Medical Rehab Prognosis:  Excellent Assessment: The patient has been admitted for CIR therapies with the diagnosis of left hip fracture. The team will be addressing functional mobility, strength, stamina, balance, safety, adaptive techniques and equipment, self-care, bowel and bladder mgt, patient and caregiver  education. Goals have been set at supervision/modI. Anticipated discharge destination is home.  See Team Conference Notes for weekly updates to the plan of care

## 2021-06-05 DIAGNOSIS — R339 Retention of urine, unspecified: Secondary | ICD-10-CM | POA: Diagnosis not present

## 2021-06-05 DIAGNOSIS — I10 Essential (primary) hypertension: Secondary | ICD-10-CM | POA: Diagnosis not present

## 2021-06-05 DIAGNOSIS — S72001A Fracture of unspecified part of neck of right femur, initial encounter for closed fracture: Secondary | ICD-10-CM | POA: Diagnosis not present

## 2021-06-05 DIAGNOSIS — Z7409 Other reduced mobility: Secondary | ICD-10-CM | POA: Diagnosis not present

## 2021-06-05 LAB — GLUCOSE, CAPILLARY
Glucose-Capillary: 124 mg/dL — ABNORMAL HIGH (ref 70–99)
Glucose-Capillary: 171 mg/dL — ABNORMAL HIGH (ref 70–99)
Glucose-Capillary: 197 mg/dL — ABNORMAL HIGH (ref 70–99)
Glucose-Capillary: 177 mg/dL — ABNORMAL HIGH (ref 70–99)

## 2021-06-05 MED ORDER — METFORMIN HCL 500 MG PO TABS
1000.0000 mg | ORAL_TABLET | Freq: Every day | ORAL | Status: DC
  Administered 2021-06-06 – 2021-06-18 (×13): 1000 mg via ORAL
  Filled 2021-06-05 (×13): qty 2

## 2021-06-05 NOTE — Progress Notes (Signed)
Physical Therapy Session Note  Patient Details  Name: Dalton Miller MRN: [id number] Date of Birth: [birth year]  Today's Date: 06/05/2021 PT Individual Time: 0800-0900 PT Individual Time Calculation (min): 60 min   Short Term Goals: Week 1:  PT Short Term Goal 1 (Week 1): Pt will complete bed mobility with min A consistently PT Short Term Goal 2 (Week 1): Pt will complete least restrictive transfer with mod A consistently PT Short Term Goal 3 (Week 1): Pt will initiate gait training PT Short Term Goal 4 (Week 1): Pt will propel w/c x 50 ft with min A  Skilled Therapeutic Interventions/Progress Updates: Pt presents supine in bed and agreeable to therapy.  Pt transfers to EOB by scooting hips to side and then mod A for bringing trunk up from elevated head.  Pt able to scoot to EOB w/ supervision and increased time.  Pt transferred sit to stand w/ mod A and verbal cues for hand placement and forward lean.  Pt performed step-pivot to w/c.  Pt wheeled into BR and performed Step pivot w/ RW to toilet w/ min A.  Pt continent of urine in toilet, NT to chart.  Pt required re-tying of pant string but able to manage clothing otherwise.  Pt improving in sit to stand transfers w/ verbal cues for forward lean.  Pt transferred toilet > w/c w/ mod to min A.  Pt wheeled to dayroom for time and energy conservation.  Pt performed multiple sit to stand transfers w/ mod to min A w/ verbal cueing for B hand placement for stand to sit w/ improved eccentric control.  Pt amb 18', 32' and 20' w/ RW and min A, verbal cues for use of RW to improve foot clearance RLE when advancing.  Pt tol well and states WB on LLE initially at 50% and then increased w/ distance.  Pt amb form hallway into room and to bed.  Pt then transferred step-pivot bed > w/c w/ RW and min A.  Pt remained sitting in w/c for next therapy w/ all needs in reach.     Therapy Documentation Precautions:  Precautions Precautions: Fall Restrictions Weight Bearing  Restrictions: Yes LLE Weight Bearing: Weight bearing as tolerated General:   Vital Signs: Therapy Vitals BP: 133/73 Pain:4/10       Therapy/Group: Individual Therapy  [name] 06/05/2021, 11:00 AM

## 2021-06-05 NOTE — Progress Notes (Signed)
Occupational Therapy Session Note  Patient Details  Name: Dalton Miller MRN: [id number] Date of Birth: January 30, 1945  Today's Date: 06/05/2021 OT Individual Time: 1275-1700 OT Individual Time Calculation (min): 46 min    Short Term Goals: Week 1:  OT Short Term Goal 1 (Week 1): Pt will complete sit > stand in prep for ADL with min A using LRAD OT Short Term Goal 2 (Week 1): Pt will utilize LUE at diminished level for ADL tasks with supervision OT Short Term Goal 3 (Week 1): Pt will tolerate at least 2 mins of standing using LRAD and mod A for balance to promote endurance needed for toileting/LB self-care OT Short Term Goal 4 (Week 1): Pt will use AE PRN for threading of BLE into pants with no more than min A for seated balance  Skilled Therapeutic Interventions/Progress Updates:     Pt received seated in w/c, reports 8/10 L hip pain but, agreeable to therapy. Session focus on activity tolerance, BUE/BLE strengthening in prep for improved ADL/IADL/func mobility performance + decreased caregiver burden.  Deferred standing this date due to L hip pain, LPN notified of pt request for pain rx and ice pack provided. Total A w/c transport to and from gym.  Pt completed 2x10 overhead ball toss against rebounder, volley ball toss with 2 lb dowel rod, and w/c push-ups to target BUE strengthening/LUE NMR/activity tolerance.  Completed 8 total minutes on kinetron at 80 cm/sec with total of 3 rest breaks, no reports of pain.  Stand-pivot back to bed with min A to pivot hips and min A to progress LLE back into supine.  Pt left semi-reclined in bed with bed alarm engaged, LPN present, call bell in reach, and all immediate needs met.   Therapy Documentation Precautions:  Precautions Precautions: Fall Restrictions Weight Bearing Restrictions: Yes LLE Weight Bearing: Weight bearing as tolerated  Pain: 8/10 L hip pain   ADL: See Care Tool for more details.   Therapy/Group: Individual  Therapy  Volanda Napoleon MS, OTR/L  06/05/2021, 6:59 AM

## 2021-06-05 NOTE — Progress Notes (Signed)
Physical Therapy Session Note  Patient Details  Name: Dalton Miller MRN: [id number] Date of Birth: 06/09/1945  Today's Date: 06/05/2021 PT Individual Time: 4431-5400 PT Individual Time Calculation (min): 42 min   Short Term Goals: Week 1:  PT Short Term Goal 1 (Week 1): Pt will complete bed mobility with min A consistently PT Short Term Goal 2 (Week 1): Pt will complete least restrictive transfer with mod A consistently PT Short Term Goal 3 (Week 1): Pt will initiate gait training PT Short Term Goal 4 (Week 1): Pt will propel w/c x 50 ft with min A   Skilled Therapeutic Interventions/Progress Updates:   Pt received supine in bed and agreeable to PT at bed level. SAQ, SLR, ankle DF/PF, hip abduction, clam shells, heel slides, quad sets. Each completed 2 x 12 BLE with cues for increased ROM and hold at end range. AAROM on the LLE for full ROM for. Pt reports mild increase in pain with movement but does not rate. Therapeutic rest break between bouts for pain management. Lef tin bed at end of session with call bell in reach and all needs met.        Therapy Documentation Precautions:  Precautions Precautions: Fall Restrictions Weight Bearing Restrictions: Yes LLE Weight Bearing: Weight bearing as tolerated  Pain:   6/10 LLE, sore. Pt repositioned.    Therapy/Group: Individual Therapy  Lorie Phenix 06/05/2021, 3:37 PM

## 2021-06-05 NOTE — Progress Notes (Signed)
Physical Therapy Session Note  Patient Details  Name: Dalton Miller MRN: [id number] Date of Birth: Oct 23, 1945  Today's Date: 06/05/2021 PT Individual Time: 1120-1206 PT Individual Time Calculation (min): 46 min   Short Term Goals: Week 1:  PT Short Term Goal 1 (Week 1): Pt will complete bed mobility with min A consistently PT Short Term Goal 2 (Week 1): Pt will complete least restrictive transfer with mod A consistently PT Short Term Goal 3 (Week 1): Pt will initiate gait training PT Short Term Goal 4 (Week 1): Pt will propel w/c x 50 ft with min A Week 2:     Skilled Therapeutic Interventions/Progress Updates:    Pain: rates as 8/10 in L hip.  States wc is "miserable: to sit in.  Therex, seating adjustments/see below.  Pt initially supine.  Supine to sit w/mod assist.   Sit to stand w/min assist and cues. stand pivot transfer to wc w/cga, cues  Transported to wc room.  Pt provided w/roho hybrid cushion and standard legrests adjusted to promote 90/90 hip/knee posture.  Pt reported significant improvement in comfort w/improved posture/support.  Pt transported to day room.  Sit to stand from wc w/light min assist, therapist blocking R foot due to tendency to slide forward.  Gait x 5 ft to Nustep w/cga, turn/sit w/cga.  Nustep x 6 min L2 for self ROM in partial wbing to tolerance, improved ROM and cadence over time.  Gait 47ft to wc w/cga, turn/sit w/cues for hand placement, cga. Wc to bed stand pivot transfer w/RW w/cga, therapist blocking R foot w/sit to stand. Sit to supine w/hob elevated wn min assist for LLE management. Pt left supine w/rails up x 3, alarm set, bed in lowest position, and needs in reach.   Therapy Documentation Precautions:  Precautions Precautions: Fall Restrictions Weight Bearing Restrictions: Yes LLE Weight Bearing: Weight bearing as tolerated     Therapy/Group: Individual Therapy Callie Fielding, PT   Jerrilyn Cairo 06/05/2021, 12:12 PM

## 2021-06-05 NOTE — Progress Notes (Signed)
PROGRESS NOTE   Subjective/Complaints: No new complaints Feeling sore after therapy No diarrhea Continues to void well  ROS: + left hip pain, +voiding well, sleeping well, denies diarrhea  Objective:   No results found. No results for input(s): WBC, HGB, HCT, PLT in the last 72 hours.  No results for input(s): NA, K, CL, CO2, GLUCOSE, BUN, CREATININE, CALCIUM in the last 72 hours.   Intake/Output Summary (Last 24 hours) at 06/05/2021 1456 Last data filed at 06/04/2021 1700 Gross per 24 hour  Intake 180 ml  Output --  Net 180 ml        Physical Exam: Vital Signs Blood pressure 133/73, pulse 77, temperature 98.2 F (36.8 C), temperature source Oral, resp. rate 16, height 5\' 7"  (1.702 m), weight 71.6 kg, SpO2 95 %.  Vitals and nursing note reviewed.  Constitutional:      Appearance: Normal appearance.     Comments: Sitting up in bed; snoozing off and on; no foley in; appears stated age, NAD, BMI 24.72. Fatigued HENT:     Head: Normocephalic and atraumatic.     Comments: Mild L facial droop    Right Ear: External ear normal.     Left Ear: External ear normal.     Nose: Nose normal. No congestion.     Mouth/Throat:     Mouth: Mucous membranes are dry.     Pharynx: Oropharynx is clear. No oropharyngeal exudate.  Eyes:     General:        Right eye: No discharge.        Left eye: No discharge.     Extraocular Movements: Extraocular movements intact.  Cardiovascular:     Rate and Rhythm: Normal rate and regular rhythm.     Heart sounds: Normal heart sounds. No murmur heard.   No gallop.  Pulmonary:     Effort: Pulmonary effort is normal. No respiratory distress.     Breath sounds: Rhonchi present. No wheezing or rales.  Abdominal:     Palpations: Abdomen is soft.     Comments: NT, ND, hypoactive BS- just had BM  Musculoskeletal:        General: Swelling present.     Cervical back: Neck supple. No tenderness.      Comments: RUE 5/5 LUE 4/5 due to previous stroke RLE 5/5 LLE- HF 2/5; 4/5 in KE/DF and PF L hip moderate swelling around incision laterally on L hip  Skin:    General: Skin is warm and dry.     Comments: Multiple healed skin tears on bilateral forearms.  Also has surgical dressing on L lateral hip- moderate bruising and edema- no erythema  Neurological:     Mental Status: He is alert and oriented to person, place, and time.     Comments: Mild left facial weakness with LUE weakness. LLE limited due to pain. He was aware of being at [facility] and situation but unable to recall day, month or year.  Decreased Orientation as above   Psychiatric:        Mood and Affect: Mood normal.        Behavior: Behavior normal. Fear of pain   Functional mobility: sit  to stand with MinA  Assessment/Plan: 1. Functional deficits which require 3+ hours per day of interdisciplinary therapy in a comprehensive inpatient rehab setting. Physiatrist is providing close team supervision and 24 hour management of active medical problems listed below. Physiatrist and rehab team continue to assess barriers to discharge/monitor patient progress toward functional and medical goals  Care Tool:  Bathing    Body parts bathed by patient: Right arm, Left arm, Chest, Abdomen, Front perineal area, Right upper leg, Left upper leg, Right lower leg, Left lower leg, Face   Body parts bathed by helper: Buttocks     Bathing assist Assist Level: Minimal Assistance - Patient > 75%     Upper Body Dressing/Undressing Upper body dressing   What is the patient wearing?: Pull over shirt    Upper body assist Assist Level: Minimal Assistance - Patient > 75%    Lower Body Dressing/Undressing Lower body dressing      What is the patient wearing?: Pants     Lower body assist Assist for lower body dressing: Moderate Assistance - Patient 50 - 74%     Toileting Toileting    Toileting assist Assist for toileting: Contact  Guard/Touching assist     Transfers Chair/bed transfer  Transfers assist     Chair/bed transfer assist level: Minimal Assistance - Patient > 75%     Locomotion Ambulation   Ambulation assist   Ambulation activity did not occur: Safety/medical concerns  Assist level: Moderate Assistance - Patient 50 - 74% Assistive device: Walker-rolling Max distance: 32   Walk 10 feet activity   Assist  Walk 10 feet activity did not occur: Safety/medical concerns  Assist level: Moderate Assistance - Patient - 50 - 74% Assistive device: Walker-rolling   Walk 50 feet activity   Assist Walk 50 feet with 2 turns activity did not occur: Safety/medical concerns         Walk 150 feet activity   Assist Walk 150 feet activity did not occur: Safety/medical concerns         Walk 10 feet on uneven surface  activity   Assist Walk 10 feet on uneven surfaces activity did not occur: Safety/medical concerns         Wheelchair     Assist Is the patient using a wheelchair?: Yes Type of Wheelchair: Manual    Wheelchair assist level: Minimal Assistance - Patient > 75% Max wheelchair distance: 37ft    Wheelchair 50 feet with 2 turns activity    Assist        Assist Level: Moderate Assistance - Patient 50 - 74%   Wheelchair 150 feet activity     Assist      Assist Level: Total Assistance - Patient < 25%   Blood pressure 133/73, pulse 77, temperature 98.2 F (36.8 C), temperature source Oral, resp. rate 16, height 5\' 7"  (1.702 m), weight 71.6 kg, SpO2 95 %.    Medical Problem List and Plan: 1. Functional deficits secondary to L hip fx in setting of previous L hemiparesis             -patient may  shower             -ELOS/Goals: 14-16 days mod I to supervision  Continue CIR 2.  Impaired mobility and ADLs: continue Eliquis 2.5mg  BID. Discussed with patient.  3. Left hip pain: Tylenol and/or oxycodone prn. Discussed scheduling Tylenol but he does not find  tylenol helpful 4. Insomnia: continue Trazodone 50mg  HS 5. Neuropsych: This patient  is capable of making decisions on his own behalf. 6. Skin/Wound Care: Surgical dressing to stay in place for 2 weeks or till f/u with ortho             --routine pressure relief measures.  7. Hypoalbuminemia: d/c Juven given hyperglycemia. Encourage high protein foods.   8. Cirrhosis of the liver: Thrombocytopenia noted at admission 93-->72-->88. Monitor for signs of bleeding.              --discussed that Ammonia is stable. Continue lactulose             --continue Rifaximin  9. Hyponatremia: Multifactorial. Will recheck in am. 10. T2DM: Hgb A1c- 6.4 and diet controlled.  --Poorly controlled post op likely due to stress and immobility.              --monitor BS ac/hs and use SSI for elevated BS             --change diet to Carb Modified.  11. Constipation: Has not had BM since admission. Has been refusing laxatives per nursing.  12. H/o melanoma/Lung CA w/mets/COPD: Encourage tobacco cessation.             --respiratory status stable. Not on MDI at home.  13. H/o GIB: continue PPI bid.  14. Insomnia: On Trazodone and melatonin.  15. HTN: Monitor BP TID--continue Norvasc daily. Start Flomax 0.4mg  HS 16. Low grade fever: Encourage IS.  17. Urinary retention: Will monitor voiding with PVR checks. discontinue Flomax 0.4mg  HS             --toilet every 4 hours.  18. L-MCA stroke: On ASA for stroke prophylaxis--resume.  19. Constipation: Had results with suppository after admission             --encouraged patient to take Lactulose for OIC.   LOS: 4 days A FACE TO FACE EVALUATION WAS PERFORMED  Dalton Miller 06/05/2021, 2:56 PM

## 2021-06-06 DIAGNOSIS — R339 Retention of urine, unspecified: Secondary | ICD-10-CM | POA: Diagnosis not present

## 2021-06-06 DIAGNOSIS — I10 Essential (primary) hypertension: Secondary | ICD-10-CM | POA: Diagnosis not present

## 2021-06-06 DIAGNOSIS — Z7409 Other reduced mobility: Secondary | ICD-10-CM | POA: Diagnosis not present

## 2021-06-06 DIAGNOSIS — S72001A Fracture of unspecified part of neck of right femur, initial encounter for closed fracture: Secondary | ICD-10-CM | POA: Diagnosis not present

## 2021-06-06 LAB — GLUCOSE, CAPILLARY
Glucose-Capillary: 139 mg/dL — ABNORMAL HIGH (ref 70–99)
Glucose-Capillary: 148 mg/dL — ABNORMAL HIGH (ref 70–99)
Glucose-Capillary: 140 mg/dL — ABNORMAL HIGH (ref 70–99)
Glucose-Capillary: 125 mg/dL — ABNORMAL HIGH (ref 70–99)

## 2021-06-06 NOTE — Progress Notes (Signed)
Physical Therapy Session Note  Patient Details  Name: Dalton Miller MRN: [id number] Date of Birth: November 29, 1945  Today's Date: 06/06/2021 PT Individual Time: 0900-1000 PT Individual Time Calculation (min): 60 min   Short Term Goals: Week 1:  PT Short Term Goal 1 (Week 1): Pt will complete bed mobility with min A consistently PT Short Term Goal 2 (Week 1): Pt will complete least restrictive transfer with mod A consistently PT Short Term Goal 3 (Week 1): Pt will initiate gait training PT Short Term Goal 4 (Week 1): Pt will propel w/c x 50 ft with min A Week 2:    Week 3:     Skilled Therapeutic Interventions/Progress Updates:   PAIN 5/10 L hip, treatment to tolerance, ROM therex, mobilization  Pt awake, alert, agreeable to session  Therex: 15 each Heel slides Hip abd/add clamshells Bridging Hip abd/add   LAQs w/cues for full ROM/slowed pace stand pivot transfer to wc w/RW and min assist, post tendency, min assist to prevent post lob w/turning.  Pt transported to gym. Sit to stand to RW w/min assist.  In standing worked on forward tapping then lateral tapping of airex pad x 15 each direction.  Pt c/o increasing L hip pain, now 7/10.  Nursing notified and pain meds requested.  Pt declined gait due to pain.  stand pivot transfer to Nustep w/min assist w/RW. NuStep x 8 min self ROM w/gradual increase in ROM to L hip w/activity. stand pivot transfer nustep to wc w/min assist. Transported to room. stand pivot transfer to bed wRW and cga, cues to sidestep to head of bed. Sit to supine w/min assist for LLE management.  Ice applied to L hip for pain relief, rates as 8/10.  Nursing aware.   Therapy Documentation Precautions:  Precautions Precautions: Fall Restrictions Weight Bearing Restrictions: Yes LLE Weight Bearing: Weight bearing as tolerated   Other Treatments:      Therapy/Group: Individual Therapy  Jerrilyn Cairo 06/06/2021, 10:03 AM

## 2021-06-06 NOTE — Plan of Care (Signed)
  Problem: Consults Goal: RH GENERAL PATIENT EDUCATION Description: See Patient Education module for education specifics. Outcome: Progressing   Problem: RH BOWEL ELIMINATION Goal: RH STG MANAGE BOWEL WITH ASSISTANCE Description: STG Manage Bowel with mod I  Assistance. Outcome: Progressing Goal: RH STG MANAGE BOWEL W/MEDICATION W/ASSISTANCE Description: STG Manage Bowel with Medication with mod I  Assistance. Outcome: Progressing   Problem: RH BLADDER ELIMINATION Goal: RH STG MANAGE BLADDER WITH ASSISTANCE Description: STG Manage Bladder With mod Assistance Outcome: Progressing Goal: RH STG MANAGE BLADDER WITH EQUIPMENT WITH ASSISTANCE Description: STG Manage Bladder With Equipment With mod Assistance Outcome: Progressing   Problem: RH SKIN INTEGRITY Goal: RH STG SKIN FREE OF INFECTION/BREAKDOWN Description: W min assist Outcome: Progressing Goal: RH STG MAINTAIN SKIN INTEGRITY WITH ASSISTANCE Description: STG Maintain Skin Integrity With min Assistance. Outcome: Progressing Goal: RH STG ABLE TO PERFORM INCISION/WOUND CARE W/ASSISTANCE Description: STG Able To Perform Incision/Wound Care With min Assistance. Outcome: Progressing   Problem: RH SAFETY Goal: RH STG ADHERE TO SAFETY PRECAUTIONS W/ASSISTANCE/DEVICE Description: STG Adhere to Safety Precautions With cues Assistance/Device. Outcome: Progressing   Problem: RH PAIN MANAGEMENT Goal: RH STG PAIN MANAGED AT OR BELOW PT'S PAIN GOAL Description: At or below level 4 with prns Outcome: Progressing   Problem: RH KNOWLEDGE DEFICIT GENERAL Goal: RH STG INCREASE KNOWLEDGE OF SELF CARE AFTER HOSPITALIZATION Description: Patient and daughter will be able to manage care at discharge using handouts and educational resources independently Outcome: Progressing

## 2021-06-06 NOTE — Progress Notes (Signed)
PROGRESS NOTE   Subjective/Complaints: No new complaints this morning Has some hip soreness Working with Lottie Dawson well off flomax.   ROS: + left hip pain, +voiding well, sleeping well, denies diarrhea  Objective:   No results found. No results for input(s): WBC, HGB, HCT, PLT in the last 72 hours.  No results for input(s): NA, K, CL, CO2, GLUCOSE, BUN, CREATININE, CALCIUM in the last 72 hours.   Intake/Output Summary (Last 24 hours) at 06/06/2021 1810 Last data filed at 06/06/2021 1801 Gross per 24 hour  Intake 1320 ml  Output 950 ml  Net 370 ml        Physical Exam: Vital Signs Blood pressure 111/61, pulse 84, temperature 98.3 F (36.8 C), resp. rate 18, height 5\' 7"  (1.702 m), weight 71.6 kg, SpO2 95 %.  Vitals and nursing note reviewed.  Constitutional:      Appearance: Normal appearance.     Comments: Sitting up in bed; snoozing off and on; no foley in; appears stated age, NAD, BMI 24.72. Fatigued HENT:     Head: Normocephalic and atraumatic.     Comments: Mild L facial droop    Right Ear: External ear normal.     Left Ear: External ear normal.     Nose: Nose normal. No congestion.     Mouth/Throat:     Mouth: Mucous membranes are dry.     Pharynx: Oropharynx is clear. No oropharyngeal exudate.  Eyes:     General:        Right eye: No discharge.        Left eye: No discharge.     Extraocular Movements: Extraocular movements intact.  Cardiovascular:     Rate and Rhythm: Normal rate and regular rhythm.     Heart sounds: Normal heart sounds. No murmur heard.   No gallop.  Pulmonary:     Effort: Pulmonary effort is normal. No respiratory distress.     Breath sounds: Rhonchi present. No wheezing or rales.  Abdominal:     Palpations: Abdomen is soft.     Comments: NT, ND, hypoactive BS- just had BM  Musculoskeletal:        General: Swelling present.     Cervical back: Neck supple. No  tenderness.     Comments: RUE 5/5 LUE 4/5 due to previous stroke RLE 5/5 LLE- HF 2/5; 4/5 in KE/DF and PF L hip moderate swelling around incision laterally on L hip  Skin:    General: Skin is warm and dry.     Comments: Multiple healed skin tears on bilateral forearms.  Also has surgical dressing on L lateral hip- moderate bruising and edema- no erythema  Neurological:     Mental Status: He is alert and oriented to person, place, and time.     Comments: Mild left facial weakness with LUE weakness. LLE limited due to pain. He was aware of being at [facility] and situation but unable to recall day, month or year.  Decreased Orientation as above   Psychiatric:        Mood and Affect: Mood normal.        Behavior: Behavior normal. Fear of pain   Functional  mobility: sit to stand with MinA Motivated for therapy  Assessment/Plan: 1. Functional deficits which require 3+ hours per day of interdisciplinary therapy in a comprehensive inpatient rehab setting. Physiatrist is providing close team supervision and 24 hour management of active medical problems listed below. Physiatrist and rehab team continue to assess barriers to discharge/monitor patient progress toward functional and medical goals  Care Tool:  Bathing    Body parts bathed by patient: Right arm, Left arm, Chest, Abdomen, Front perineal area, Right upper leg, Left upper leg, Right lower leg, Left lower leg, Face   Body parts bathed by helper: Buttocks     Bathing assist Assist Level: Minimal Assistance - Patient > 75%     Upper Body Dressing/Undressing Upper body dressing   What is the patient wearing?: Pull over shirt    Upper body assist Assist Level: Minimal Assistance - Patient > 75%    Lower Body Dressing/Undressing Lower body dressing      What is the patient wearing?: Pants     Lower body assist Assist for lower body dressing: Moderate Assistance - Patient 50 - 74%     Toileting Toileting    Toileting  assist Assist for toileting: Contact Guard/Touching assist     Transfers Chair/bed transfer  Transfers assist     Chair/bed transfer assist level: Minimal Assistance - Patient > 75%     Locomotion Ambulation   Ambulation assist   Ambulation activity did not occur: Safety/medical concerns  Assist level: Moderate Assistance - Patient 50 - 74% Assistive device: Walker-rolling Max distance: 32   Walk 10 feet activity   Assist  Walk 10 feet activity did not occur: Safety/medical concerns  Assist level: Moderate Assistance - Patient - 50 - 74% Assistive device: Walker-rolling   Walk 50 feet activity   Assist Walk 50 feet with 2 turns activity did not occur: Safety/medical concerns         Walk 150 feet activity   Assist Walk 150 feet activity did not occur: Safety/medical concerns         Walk 10 feet on uneven surface  activity   Assist Walk 10 feet on uneven surfaces activity did not occur: Safety/medical concerns         Wheelchair     Assist Is the patient using a wheelchair?: Yes Type of Wheelchair: Manual    Wheelchair assist level: Minimal Assistance - Patient > 75% Max wheelchair distance: 32ft    Wheelchair 50 feet with 2 turns activity    Assist        Assist Level: Moderate Assistance - Patient 50 - 74%   Wheelchair 150 feet activity     Assist      Assist Level: Total Assistance - Patient < 25%   Blood pressure 111/61, pulse 84, temperature 98.3 F (36.8 C), resp. rate 18, height 5\' 7"  (1.702 m), weight 71.6 kg, SpO2 95 %.    Medical Problem List and Plan: 1. Functional deficits secondary to L hip fx in setting of previous L hemiparesis             -patient may  shower             -ELOS/Goals: 14-16 days mod I to supervision  Continue CIR 2.  Impaired mobility and ADLs: continue Eliquis 2.5mg  BID. Discussed with patient.  3. Left hip pain: continue Tylenol and/or oxycodone prn. Discussed scheduling Tylenol  but he does not find tylenol helpful 4. Insomnia: continue Trazodone 50mg  HS 5.  Neuropsych: This patient is capable of making decisions on his own behalf. 6. Skin/Wound Care: Surgical dressing to stay in place for 2 weeks or till f/u with ortho             --routine pressure relief measures.  7. Hypoalbuminemia: d/c Juven given hyperglycemia. Encourage high protein foods.   8. Cirrhosis of the liver: Thrombocytopenia noted at admission 93-->72-->88. Monitor for signs of bleeding.              --discussed that Ammonia is stable. Continue lactulose             --continue Rifaximin  9. Hyponatremia: Multifactorial. Will recheck in am. 10. T2DM: Hgb A1c- 6.4 and diet controlled.  --Poorly controlled post op likely due to stress and immobility.              --monitor BS ac/hs and use SSI for elevated BS             --change diet to Carb Modified.  11. Constipation: Has not had BM since admission. Has been refusing laxatives per nursing.  12. H/o melanoma/Lung CA w/mets/COPD: Encourage tobacco cessation.             --respiratory status stable. Not on MDI at home.  13. H/o GIB: continue PPI bid.  14. Insomnia: On Trazodone and melatonin.  15. HTN: Monitor BP TID--continue Norvasc daily. Start Flomax 0.4mg  HS 16. Low grade fever: Encourage IS.  17. Urinary retention: Will monitor voiding with PVR checks. discontinue Flomax 0.4mg  HS             --toilet every 4 hours.  18. L-MCA stroke: On ASA for stroke prophylaxis--resume.  19. Constipation: Had results with suppository after admission             --encouraged patient to take Lactulose for OIC.   LOS: 5 days A FACE TO FACE EVALUATION WAS PERFORMED  Martha Clan P Kellen Hover 06/06/2021, 6:10 PM

## 2021-06-07 DIAGNOSIS — D649 Anemia, unspecified: Secondary | ICD-10-CM | POA: Diagnosis not present

## 2021-06-07 DIAGNOSIS — K5900 Constipation, unspecified: Secondary | ICD-10-CM | POA: Diagnosis not present

## 2021-06-07 DIAGNOSIS — I10 Essential (primary) hypertension: Secondary | ICD-10-CM | POA: Diagnosis not present

## 2021-06-07 DIAGNOSIS — S72002D Fracture of unspecified part of neck of left femur, subsequent encounter for closed fracture with routine healing: Secondary | ICD-10-CM | POA: Diagnosis not present

## 2021-06-07 DIAGNOSIS — E119 Type 2 diabetes mellitus without complications: Secondary | ICD-10-CM

## 2021-06-07 LAB — CBC
HCT: 22.9 % — ABNORMAL LOW (ref 39.0–52.0)
Hemoglobin: 7.4 g/dL — ABNORMAL LOW (ref 13.0–17.0)
MCH: 29.1 pg (ref 26.0–34.0)
MCHC: 32.3 g/dL (ref 30.0–36.0)
MCV: 90.2 fL (ref 80.0–100.0)
Platelets: 153 10*3/uL (ref 150–400)
RBC: 2.54 MIL/uL — ABNORMAL LOW (ref 4.22–5.81)
RDW: 16.5 % — ABNORMAL HIGH (ref 11.5–15.5)
WBC: 4.5 10*3/uL (ref 4.0–10.5)
nRBC: 0 % (ref 0.0–0.2)

## 2021-06-07 LAB — GLUCOSE, CAPILLARY
Glucose-Capillary: 114 mg/dL — ABNORMAL HIGH (ref 70–99)
Glucose-Capillary: 168 mg/dL — ABNORMAL HIGH (ref 70–99)
Glucose-Capillary: 180 mg/dL — ABNORMAL HIGH (ref 70–99)
Glucose-Capillary: 115 mg/dL — ABNORMAL HIGH (ref 70–99)

## 2021-06-07 NOTE — Progress Notes (Signed)
Physical Therapy Session Note  Patient Details  Name: Dalton Miller MRN: [id number] Date of Birth: Aug 20, 1945  Today's Date: 06/07/2021 PT Individual Time: 6160-7371 PT Individual Time Calculation (min): 70 min   Short Term Goals: Week 1:  PT Short Term Goal 1 (Week 1): Pt will complete bed mobility with min A consistently PT Short Term Goal 2 (Week 1): Pt will complete least restrictive transfer with mod A consistently PT Short Term Goal 3 (Week 1): Pt will initiate gait training PT Short Term Goal 4 (Week 1): Pt will propel w/c x 50 ft with min A  Skilled Therapeutic Interventions/Progress Updates:   Received pt semi-reclined in bed, pt agreeable to PT treatment, and denied any pain at rest, increasing to 6/10 in L hip with mobility - repositioning, rest breaks, and distraction done to reduce pain levels. Session with emphasis on functional mobility/transfers, generalized strengthening and endurance, dynamic standing balance/coordination, and gait training. Pt transferred semi-reclined<>sitting EOB with HOB elevated and mod A for LLE management and trunk control. Pt transferred bed<>WC stand<>pivot with RW and min A (mod A to stand) due to slight posterior LOB. Pt transported to/from room in [facility] dependently for time management purposes. Sit<>stand with RW and min A and pt ambulated 41ft x 1 and 66ft x 1 with RW and CGA - noted decreased weight shifting to L side and decreased L heel strike. Pt with questions regarding a rollator - informed pt that he currently relies heavily on UEs to offweight LLE and at this point the rollator moves too quickly for him - pt in agreement but will work toward goal of using rollator at discharge. Pt then performed the following exercises with supervision and verbal cues for technique with emphasis on LE strength/ROM: -seated L LE LAQ 2x15 - decreased ROM and cues to avoid using UEs to assist. -seated L LLE hip flexion 2x10 -standing L LE hip extension 2x10 -standing  heel raises 2x10 -standing L hip abduction 2x10 Pt continues to be limited by pain and politely declined any further standing activities. Stand<>pivot mat<>WC with RW and min A and transported to ortho gym. Pt performed seated BUE strengthening on UBE for 3 minutes forward to fatigue - emphasis on functional use and grip of LUE. Took rest break then went backwards for additional 2 minutes. Returned to room and concluded session with pt sitting in [facility] with all needs within reach awaiting upcoming OT session. Provided pt with ice pack for L hip.   Therapy Documentation Precautions:  Precautions Precautions: Fall Restrictions Weight Bearing Restrictions: Yes LLE Weight Bearing: Weight bearing as tolerated  Therapy/Group: Individual Therapy Alfonse Alpers PT, DPT  06/07/2021, 6:57 AM

## 2021-06-07 NOTE — Progress Notes (Signed)
Occupational Therapy Session Note  Patient Details  Name: Dalton Miller MRN: [id number] Date of Birth: January 12, 1945  Today's Date: 06/07/2021 OT Individual Time: 1020-1100 & 1420-1530 OT Individual Time Calculation (min): 40 min & 70 min   Short Term Goals: Week 1:  OT Short Term Goal 1 (Week 1): Pt will complete sit > stand in prep for ADL with min A using LRAD OT Short Term Goal 2 (Week 1): Pt will utilize LUE at diminished level for ADL tasks with supervision OT Short Term Goal 3 (Week 1): Pt will tolerate at least 2 mins of standing using LRAD and mod A for balance to promote endurance needed for toileting/LB self-care OT Short Term Goal 4 (Week 1): Pt will use AE PRN for threading of BLE into pants with no more than min A for seated balance  Skilled Therapeutic Interventions/Progress Updates:  Session 1 Skilled OT intervention completed with focus on LUE functional reaching and fine motor strengthening, functional transfers. Pt received seated in, c/o 8/10 pain in L hip, pre-medicated with ice already applied, with therapist minimizing stands and transfers with modifications utilized for pain reduction throughout session. Transported dependently in w/c <> gym for time management. Seated at table, pt completed the following to promote functional reaching abilities on his non-dominant side during ADL tasks:  -Retrieving <> transferring cones with focus on elbow extension and shoulder flexion with pt demonstrating limited AROM at shoulder and tightness with therapist lightly facilitating at elbow for first round then pt with improved extension and reach with 2nd trial. -Placing/removing yellow/red clips on low and medium level horizontal bars with pt self-initiating using R hand to lift L hand, with education provided about placing clip box further away to allow functional reach vs elbow flexion. Pt with limited radial deviation at baseline from prior CVA, making task difficult with placing clips  and would benefit from ROM and light stretching to improve AROM. Min difficulty with red clips with fine motor strength  Completed sit > stand with mod A using RW, then stand pivot to EOB with min A. Min A needed for LLE management into bed. Pt was left semi-upright in bed, ice on L hip for pain, bed alarm on and all needs in reach at end of session.   Session 2 Skilled OT intervention completed with focus on functional endurance, ADL retraining, L wrist stretching and self-ROM exercises. Pt received upright in bed, 5/10 pain L hip, pre-medicated, with therapist offering shower and modifications as needed for pain reduction. Completed bed mobility with min A for LLE and trunk via HHA. Sit > stand using RW with min A then ambulatory transfer to shower with min A. Pt with improved tolerance of WBAT on LLE this session. Min A needed for side stepping from RW to grab bars to enter shower. Covered LLE surgical dressings per MD/nursing with waterproof cover. Completed all bathing at supervision level with use of LH sponge for feet and back. Education provided about technique and hygiene considerations with use of it at home and where to purchase. Donned pants with mod A at the sit > stand level using grab bars, then shirt with min A. Socks donned with max A. Ambulatory transfer to w/c in room using RW with min A.   Transported pt dependently in w/c from room <> [employer]	[place] for change of scenery. Applied hot pack around L wrist in prep for stretches. Therapist completed light stretching including L wrist flex/ext & radial/ulnar deviation x10 each, with ROM  available improving after reps. Educated pt on self-ROM exercises that he could complete in between therapies, following the same motions of the L wrist, with pt return demonstrating x2 of each motion. Pt was left seated in w/c in room, with chair pad alarm and all needs in reach at end of session.    Therapy Documentation Precautions:   Precautions Precautions: Fall Restrictions Weight Bearing Restrictions: Yes LLE Weight Bearing: Weight bearing as tolerated    Therapy/Group: Individual Therapy  Bethaney Oshana E Haileigh Pitz 06/07/2021, 7:47 AM

## 2021-06-07 NOTE — Progress Notes (Signed)
PROGRESS NOTE   Subjective/Complaints: NO new complaints or concerns this AM.  ROS: + left hip pain, +voiding well, sleeping well, denies diarrhea, no CP or SOB  Objective:   No results found. Recent Labs    06/07/21 0627  WBC 4.5  HGB 7.4*  HCT 22.9*  PLT 153    No results for input(s): NA, K, CL, CO2, GLUCOSE, BUN, CREATININE, CALCIUM in the last 72 hours.   Intake/Output Summary (Last 24 hours) at 06/07/2021 1608 Last data filed at 06/07/2021 0757 Gross per 24 hour  Intake 1196 ml  Output 1225 ml  Net -29 ml         Physical Exam: Vital Signs Blood pressure 119/75, pulse 80, temperature 98.5 F (36.9 C), resp. rate 17, height 5\' 7"  (1.702 m), weight 71.6 kg, SpO2 96 %.  Vitals and nursing note reviewed.  Constitutional:      Appearance: Normal appearance.  Sitting in bed    Comments: Sitting up in bed; snoozing off and on; no foley in; appears stated age, NAD, BMI 24.72. Fatigued HENT:     Head: Normocephalic and atraumatic.     Comments: Mild L facial droop    Right Ear: External ear normal.     Left Ear: External ear normal.     Nose: Nose normal. No congestion.     Mouth/Throat:     Mouth: Mucous membranes are dry.     Pharynx: Oropharynx is clear. No oropharyngeal exudate.  Eyes:     General:        Right eye: No discharge.        Left eye: No discharge.     Extraocular Movements: Extraocular movements intact.  Cardiovascular:     Rate and Rhythm: Normal rate and regular rhythm.     Heart sounds: Normal heart sounds. No murmur heard.   No gallop.  Pulmonary:     Effort: Pulmonary effort is normal. No respiratory distress.     Breath sounds:  No wheezing or rales.  Abdominal:     Palpations: Abdomen is soft.     Comments: NT, ND, hypoactive BS- just had BM  Musculoskeletal:        General: Swelling present.     Cervical back: Neck supple. No tenderness.     Comments: RUE 5/5 LUE 4/5 due  to previous stroke RLE 5/5 LLE- HF 2/5; 4/5 in KE/DF and PF L hip moderate swelling around incision laterally on L hip  Skin:    General: Skin is warm and dry.     Comments: Multiple healed skin tears on bilateral forearms.  Also has surgical dressing on L lateral hip- moderate bruising and edema- no erythema  Neurological:     Mental Status: He is alert and oriented to person, place, and time.     Comments: Mild left facial weakness with LUE weakness. LLE limited due to pain. He was aware of being at [facility] and situation but unable to recall day, month or year.  Decreased Orientation as above   Psychiatric:        Mood and Affect: Mood normal.    Pleasant    Behavior: Behavior normal. Fear  of pain   Functional mobility: sit to stand with MinA Motivated for therapy  Assessment/Plan: 1. Functional deficits which require 3+ hours per day of interdisciplinary therapy in a comprehensive inpatient rehab setting. Physiatrist is providing close team supervision and 24 hour management of active medical problems listed below. Physiatrist and rehab team continue to assess barriers to discharge/monitor patient progress toward functional and medical goals  Care Tool:  Bathing    Body parts bathed by patient: Right arm, Left arm, Chest, Abdomen, Front perineal area, Right upper leg, Left upper leg, Right lower leg, Left lower leg, Face, Buttocks   Body parts bathed by helper: Buttocks     Bathing assist Assist Level: Supervision/Verbal cueing     Upper Body Dressing/Undressing Upper body dressing   What is the patient wearing?: Pull over shirt    Upper body assist Assist Level: Minimal Assistance - Patient > 75%    Lower Body Dressing/Undressing Lower body dressing      What is the patient wearing?: Pants     Lower body assist Assist for lower body dressing: Moderate Assistance - Patient 50 - 74%     Toileting Toileting    Toileting assist Assist for toileting: Contact  Guard/Touching assist     Transfers Chair/bed transfer  Transfers assist     Chair/bed transfer assist level: Minimal Assistance - Patient > 75%     Locomotion Ambulation   Ambulation assist   Ambulation activity did not occur: Safety/medical concerns  Assist level: Minimal Assistance - Patient > 75% Assistive device: Walker-rolling Max distance: 70ft   Walk 10 feet activity   Assist  Walk 10 feet activity did not occur: Safety/medical concerns  Assist level: Minimal Assistance - Patient > 75% Assistive device: Walker-rolling   Walk 50 feet activity   Assist Walk 50 feet with 2 turns activity did not occur: Safety/medical concerns  Assist level: Minimal Assistance - Patient > 75% Assistive device: Walker-rolling    Walk 150 feet activity   Assist Walk 150 feet activity did not occur: Safety/medical concerns         Walk 10 feet on uneven surface  activity   Assist Walk 10 feet on uneven surfaces activity did not occur: Safety/medical concerns         Wheelchair     Assist Is the patient using a wheelchair?: Yes Type of Wheelchair: Manual    Wheelchair assist level: Minimal Assistance - Patient > 75% Max wheelchair distance: 57ft    Wheelchair 50 feet with 2 turns activity    Assist        Assist Level: Moderate Assistance - Patient 50 - 74%   Wheelchair 150 feet activity     Assist      Assist Level: Total Assistance - Patient < 25%   Blood pressure 119/75, pulse 80, temperature 98.5 F (36.9 C), resp. rate 17, height 5\' 7"  (1.702 m), weight 71.6 kg, SpO2 96 %.    Medical Problem List and Plan: 1. Functional deficits secondary to L hip fx in setting of previous L hemiparesis             -patient may  shower             -ELOS/Goals: 14-16 days mod I to supervision  -Ambulated 75 feet with RW  Continue CIR 2.  Impaired mobility and ADLs: continue Eliquis 2.5mg  BID. Discussed with patient.  3. Left hip pain:  continue Tylenol and/or oxycodone prn. Discussed scheduling Tylenol but he  does not find tylenol helpful 4. Insomnia: continue Trazodone 50mg  HS 5. Neuropsych: This patient is capable of making decisions on his own behalf. 6. Skin/Wound Care: Surgical dressing to stay in place for 2 weeks or till f/u with ortho             --routine pressure relief measures.  7. Hypoalbuminemia: d/c Juven given hyperglycemia. Encourage high protein foods.   8. Cirrhosis of the liver: Thrombocytopenia noted at admission 93-->72-->88. Monitor for signs of bleeding.              --discussed that Ammonia is stable. Continue lactulose             --continue Rifaximin  9. Hyponatremia: Multifactorial. Will recheck in am. 10. T2DM: Hgb A1c- 6.4 and diet controlled.  --Poorly controlled post op likely due to stress and immobility.              --monitor BS ac/hs and use SSI for elevated BS             --change diet to Carb Modified.   -6/5CBG with fair control, continue to monitor 11. Constipation: Has not had BM since admission. Has been refusing laxatives per nursing.  -6/5 had BM yesterday 12. H/o melanoma/Lung CA w/mets/COPD: Encourage tobacco cessation.             --respiratory status stable. Not on MDI at home.  13. H/o GIB: continue PPI bid.  14. Insomnia: On Trazodone and melatonin.  15. HTN: Monitor BP TID--continue Norvasc daily. Start Flomax 0.4mg  HS -6/5 BP well controlled 16. Low grade fever: Encourage IS.  17. Urinary retention: Will monitor voiding with PVR checks. discontinue Flomax 0.4mg  HS             --toilet every 4 hours.  18. L-MCA stroke: On ASA for stroke prophylaxis--resume.  19. Constipation: Had results with suppository after admission             --encouraged patient to take Lactulose for OIC.  20. Anemia  -6/5 Stable HGB at 7.4, follow  LOS: 6 days A FACE TO FACE EVALUATION WAS PERFORMED  Jennye Boroughs 06/07/2021, 4:08 PM

## 2021-06-08 DIAGNOSIS — I10 Essential (primary) hypertension: Secondary | ICD-10-CM | POA: Diagnosis not present

## 2021-06-08 DIAGNOSIS — Z7409 Other reduced mobility: Secondary | ICD-10-CM | POA: Diagnosis not present

## 2021-06-08 DIAGNOSIS — S72001A Fracture of unspecified part of neck of right femur, initial encounter for closed fracture: Secondary | ICD-10-CM | POA: Diagnosis not present

## 2021-06-08 DIAGNOSIS — R339 Retention of urine, unspecified: Secondary | ICD-10-CM | POA: Diagnosis not present

## 2021-06-08 LAB — BASIC METABOLIC PANEL
Anion gap: 7 (ref 5–15)
BUN: 21 mg/dL (ref 8–23)
CO2: 24 mmol/L (ref 22–32)
Calcium: 8.6 mg/dL — ABNORMAL LOW (ref 8.9–10.3)
Chloride: 104 mmol/L (ref 98–111)
Creatinine, Ser: 1.12 mg/dL (ref 0.61–1.24)
GFR, Estimated: >60 mL/min (ref >60)
Glucose, Bld: 171 mg/dL — ABNORMAL HIGH (ref 70–99)
Potassium: 4.1 mmol/L (ref 3.5–5.1)
Sodium: 135 mmol/L (ref 135–145)

## 2021-06-08 LAB — GLUCOSE, CAPILLARY
Glucose-Capillary: 175 mg/dL — ABNORMAL HIGH (ref 70–99)
Glucose-Capillary: 134 mg/dL — ABNORMAL HIGH (ref 70–99)
Glucose-Capillary: 189 mg/dL — ABNORMAL HIGH (ref 70–99)
Glucose-Capillary: 136 mg/dL — ABNORMAL HIGH (ref 70–99)

## 2021-06-08 NOTE — Progress Notes (Signed)
PROGRESS NOTE   Subjective/Complaints: No new complaints this morning Still has hip soreness. CBGs well controlled, discussed stopping HS check and HS coverage and he is pleased.   ROS: + left hip soreness, +voiding well, sleeping well, denies diarrhea  Objective:   No results found. Recent Labs    06/07/21 0627  WBC 4.5  HGB 7.4*  HCT 22.9*  PLT 153    Recent Labs    06/08/21 0542  NA 135  K 4.1  CL 104  CO2 24  GLUCOSE 171*  BUN 21  CREATININE 1.12  CALCIUM 8.6*     Intake/Output Summary (Last 24 hours) at 06/08/2021 1016 Last data filed at 06/08/2021 0600 Gross per 24 hour  Intake --  Output 1000 ml  Net -1000 ml        Physical Exam: Vital Signs Blood pressure 121/69, pulse 77, temperature 98.3 F (36.8 C), resp. rate 15, height 5\' 7"  (1.702 m), weight 71.6 kg, SpO2 96 %.  Vitals and nursing note reviewed.  Constitutional:      Appearance: Normal appearance.     Comments: Sitting up in bed; snoozing off and on; no foley in; appears stated age, NAD, BMI 24.72. Fatigued HENT:     Head: Normocephalic and atraumatic.     Comments: Mild L facial droop    Right Ear: External ear normal.     Left Ear: External ear normal.     Nose: Nose normal. No congestion.     Mouth/Throat:     Mouth: Mucous membranes are dry.     Pharynx: Oropharynx is clear. No oropharyngeal exudate.  Eyes:     General:        Right eye: No discharge.        Left eye: No discharge.     Extraocular Movements: Extraocular movements intact.  Cardiovascular:     Rate and Rhythm: Normal rate and regular rhythm.     Heart sounds: Normal heart sounds. No murmur heard.   No gallop.  Pulmonary:     Effort: Pulmonary effort is normal. No respiratory distress.     Breath sounds: Rhonchi present. No wheezing or rales.  Abdominal:     Palpations: Abdomen is soft.     Comments: NT, ND, hypoactive BS- just had BM  Musculoskeletal:         General: Swelling present.     Cervical back: Neck supple. No tenderness.     Comments: RUE 5/5 LUE 4/5 due to previous stroke RLE 5/5 LLE- HF 2/5; 4/5 in KE/DF and PF L hip moderate swelling around incision laterally on L hip  Functional mobility: transferring to WC with RW MinA Skin:    General: Skin is warm and dry.     Comments: Multiple healed skin tears on bilateral forearms.  Also has surgical dressing on L lateral hip- moderate bruising and edema- no erythema  Neurological:     Mental Status: He is alert and oriented to person, place, and time.     Comments: Mild left facial weakness with LUE weakness. LLE limited due to pain. He was aware of being at [facility] and situation but unable to recall day, month  or year.  Decreased Orientation as above   Psychiatric:        Mood and Affect: Mood normal.        Behavior: Behavior normal. Fear of pain   Functional mobility: sit to stand with MinA Motivated for therapy  Assessment/Plan: 1. Functional deficits which require 3+ hours per day of interdisciplinary therapy in a comprehensive inpatient rehab setting. Physiatrist is providing close team supervision and 24 hour management of active medical problems listed below. Physiatrist and rehab team continue to assess barriers to discharge/monitor patient progress toward functional and medical goals  Care Tool:  Bathing    Body parts bathed by patient: Right arm, Left arm, Chest, Abdomen, Front perineal area, Right upper leg, Left upper leg, Right lower leg, Left lower leg, Face, Buttocks   Body parts bathed by helper: Buttocks     Bathing assist Assist Level: Supervision/Verbal cueing     Upper Body Dressing/Undressing Upper body dressing   What is the patient wearing?: Pull over shirt    Upper body assist Assist Level: Supervision/Verbal cueing    Lower Body Dressing/Undressing Lower body dressing      What is the patient wearing?: Pants     Lower body assist  Assist for lower body dressing: Moderate Assistance - Patient 50 - 74%     Toileting Toileting    Toileting assist Assist for toileting: Contact Guard/Touching assist     Transfers Chair/bed transfer  Transfers assist     Chair/bed transfer assist level: Minimal Assistance - Patient > 75%     Locomotion Ambulation   Ambulation assist   Ambulation activity did not occur: Safety/medical concerns  Assist level: Minimal Assistance - Patient > 75% Assistive device: Walker-rolling Max distance: 44ft   Walk 10 feet activity   Assist  Walk 10 feet activity did not occur: Safety/medical concerns  Assist level: Minimal Assistance - Patient > 75% Assistive device: Walker-rolling   Walk 50 feet activity   Assist Walk 50 feet with 2 turns activity did not occur: Safety/medical concerns  Assist level: Minimal Assistance - Patient > 75% Assistive device: Walker-rolling    Walk 150 feet activity   Assist Walk 150 feet activity did not occur: Safety/medical concerns         Walk 10 feet on uneven surface  activity   Assist Walk 10 feet on uneven surfaces activity did not occur: Safety/medical concerns         Wheelchair     Assist Is the patient using a wheelchair?: Yes Type of Wheelchair: Manual    Wheelchair assist level: Minimal Assistance - Patient > 75% Max wheelchair distance: 34ft    Wheelchair 50 feet with 2 turns activity    Assist        Assist Level: Moderate Assistance - Patient 50 - 74%   Wheelchair 150 feet activity     Assist      Assist Level: Total Assistance - Patient < 25%   Blood pressure 121/69, pulse 77, temperature 98.3 F (36.8 C), resp. rate 15, height 5\' 7"  (1.702 m), weight 71.6 kg, SpO2 96 %.    Medical Problem List and Plan: 1. Functional deficits secondary to L hip fx in setting of previous L hemiparesis             -patient may  shower             -ELOS/Goals: 14-16 days mod I to  supervision  Continue CIR 2.  Impaired mobility and  ADLs: continue Eliquis 2.5mg  BID because was refusing Lovenox shots. Discussed with patient.  3. Left hip pain: continue Tylenol and/or oxycodone prn. Discussed scheduling Tylenol but he does not find tylenol helpful. Continue ice.  4. Insomnia: continue Trazodone 50mg  HS 5. Neuropsych: This patient is capable of making decisions on his own behalf. 6. Skin/Wound Care: Surgical dressing to stay in place for 2 weeks or till f/u with ortho             --routine pressure relief measures.  7. Hypoalbuminemia: d/c Juven given hyperglycemia. Encourage high protein foods.   8. Cirrhosis of the liver: Thrombocytopenia noted at admission 93-->72-->88. Monitor for signs of bleeding.              --discussed that Ammonia is stable. Continue lactulose             --continue Rifaximin  9. Hyponatremia: Multifactorial. Will recheck in am. 10. T2DM: Hgb A1c- 6.4 and diet controlled.  --Poorly controlled post op likely due to stress and immobility.              --decrease CBGS checks to [facility] and use SSI for elevated BS             --change diet to Carb Modified.  11. Constipation: Has not had BM since admission. Has been refusing laxatives per nursing.  12. H/o melanoma/Lung CA w/mets/COPD: Encourage tobacco cessation.             --respiratory status stable. Not on MDI at home.  13. H/o GIB: continue PPI bid.  14. Insomnia: On Trazodone and melatonin.  15. HTN: Monitor BP TID--continue Norvasc daily. Start Flomax 0.4mg  HS 16. Low grade fever: Encourage IS.  17. Urinary retention: Will monitor voiding with PVR checks. discontinue Flomax 0.4mg  HS             --toilet every 4 hours.  18. L-MCA stroke: On ASA for stroke prophylaxis--resume.  19. Constipation: Had results with suppository after admission             --encouraged patient to take Lactulose for OIC.   LOS: 7 days A FACE TO FACE EVALUATION WAS PERFORMED  Jory Welke P Shady Bradish 06/08/2021, 10:16 AM

## 2021-06-08 NOTE — Progress Notes (Signed)
Occupational Therapy Session Note  Patient Details  Name: Dalton Miller MRN: [id number] Date of Birth: [birth year]  Today's Date: 06/08/2021 OT Individual Time: 5053-9767  & 1300-1410 OT Individual Time Calculation (min): 57 min  & 70 min   Short Term Goals: Week 1:  OT Short Term Goal 1 (Week 1): Pt will complete sit > stand in prep for ADL with min A using LRAD OT Short Term Goal 2 (Week 1): Pt will utilize LUE at diminished level for ADL tasks with supervision OT Short Term Goal 3 (Week 1): Pt will tolerate at least 2 mins of standing using LRAD and mod A for balance to promote endurance needed for toileting/LB self-care OT Short Term Goal 4 (Week 1): Pt will use AE PRN for threading of BLE into pants with no more than min A for seated balance  Skilled Therapeutic Interventions/Progress Updates: Session 1 Skilled OT intervention completed with focus on tub/shower transfer education, AE education. Pt received upright in bed, 5/10 pain in L hip, notified nurse of needed meds with nurse providing at start of session. Completed bed mobility with min A for LLE and HHA for trunk. Discussed shower set up at home, his tub bench and plans for remodeling into walk in shower with therapist offering suggestions on how to maximize the space and make it safer for ADLs. Pt agreeable to change clothes 2/2 food/urine spillage, with pt able to doff/donn shirt with supervision as well as for sitting balance. With demonstration and education provided prior, pt was able to return demonstrate use of reacher for threading pants with min A for efficiency and overall mod A for donning at the sit > stand level using RW with mod A for powering up. Demo and education provided on sock aid with pt able to donn R foot initially with min A then progress L side to supervision only with cues for technique. Discussed LH shoe horn and purchasing options for all AE, and therapist provided handouts for options to purchase for use at home  to maximize independence. Sit > stand with mod A using RW, then min A stand pivot to w/c 2/2 pain, with pt agreeable to stay up in w/c for next PT session. Pt was left seated in w/c, chair alarm on and all needs in reach at end of session.  Session 2 Skilled OT intervention completed with focus on activity tolerance, functional transfers, L hand grip strength. Pt received upright in bed, 7/10 pain in L hip, pre-medicated with therapist utilizing environmental distracters, rest breaks and modifications for pain reduction. Transported dependently in w/c <> outside hospital. At table top, therapist applied hot pack to wrist, as well as provided PROM/light stretching for all wrist motions x10 each to improve available ROM. Pt with difficulty remembering technique for self-ROM exercises given therefore reviewed and pt completed x4 each. To promote functional grasp in L hand needed for ADL tasks, and with less than 1# on gripper, pt utilized horizontal grasp in L hand to pick up and transfer peg pieces to wash cloth. Modifications needed for gripping on 8/10 of the peg pieces with therapist educating pt on gripping peg with L hand as much as possible then finishing with assist from R hand to sustain grip for transfer over. Pt able to complete 2/10 pegs without assist from R hand.   To promote functional ambulation on different terrain for home management purposes, pt ambulated 10 ft using RW with min A, with 1 posterior LOB due to increase  in pain requiring mod A to correct balance. Back in room completed > stand and stand pivot with min A using RW. Min A for advancing LLE into bed. Pt was left upright in bed, with ice applied to L hip for pain, bed alarm on and all needs in reach at end of session.     Therapy Documentation Precautions:  Precautions Precautions: Fall Restrictions Weight Bearing Restrictions: Yes LLE Weight Bearing: Weight bearing as tolerated    Therapy/Group: Individual  Therapy  Blase Mess, MS, OTR/L  06/08/2021, 7:23 AM

## 2021-06-08 NOTE — Plan of Care (Signed)
  Problem: Sit to Stand Goal: LTG:  Patient will perform sit to stand with assistance level (PT) Description: LTG:  Patient will perform sit to stand with assistance level (PT) Flowsheets (Taken 06/08/2021 0719) LTG: PT will perform sit to stand in preparation for functional mobility with assistance level: (upgragded due to improved strength/balance) Supervision/Verbal cueing Note: upgragded due to improved strength/balance   Problem: RH Bed to Chair Transfers Goal: LTG Patient will perform bed/chair transfers w/assist (PT) Description: LTG: Patient will perform bed to chair transfers with assistance (PT). Flowsheets (Taken 06/08/2021 0719) LTG: Pt will perform Bed to Chair Transfers with assistance level: (upgragded due to improved strength/balance) Supervision/Verbal cueing Note: upgragded due to improved strength/balance   Problem: RH Car Transfers Goal: LTG Patient will perform car transfers with assist (PT) Description: LTG: Patient will perform car transfers with assistance (PT). Flowsheets (Taken 06/08/2021 0719) LTG: Pt will perform car transfers with assist:: (upgragded due to improved strength/balance) Contact Guard/Touching assist Note: upgragded due to improved strength/balance   Problem: RH Ambulation Goal: LTG Patient will ambulate in controlled environment (PT) Description: LTG: Patient will ambulate in a controlled environment, # of feet with assistance (PT). Flowsheets (Taken 06/08/2021 0719) LTG: Pt will ambulate in controlled environ  assist needed:: (upgragded due to improved strength/balance) Supervision/Verbal cueing LTG: Ambulation distance in controlled environment: 152ft with LRAD Goal: LTG Patient will ambulate in home environment (PT) Description: LTG: Patient will ambulate in home environment, # of feet with assistance (PT). Flowsheets (Taken 06/08/2021 0719) LTG: Pt will ambulate in home environ  assist needed:: (upgragded due to improved strength/balance)  Supervision/Verbal cueing LTG: Ambulation distance in home environment: 45ft with LRAD Note: upgragded due to improved strength/balance   Problem: RH Wheelchair Mobility Goal: LTG Patient will propel w/c in controlled environment (PT) Description: LTG: Patient will propel wheelchair in controlled environment, # of feet with assist (PT) Flowsheets (Taken 06/08/2021 0719) LTG: Pt will propel w/c in controlled environ  assist needed:: (downgraded due to impaired use of LUE from previous CVA) Supervision/Verbal cueing LTG: Propel w/c distance in controlled environment: 171ft Note: downgraded due to impaired use of LUE from previous CVA Goal: LTG Patient will propel w/c in home environment (PT) Description: LTG: Patient will propel wheelchair in home environment, # of feet with assistance (PT). Flowsheets (Taken 06/08/2021 0719) LTG: Pt will propel w/c in home environ  assist needed:: (downgraded due to impaired use of LUE from previous CVA) Supervision/Verbal cueing LTG: Propel w/c distance in home environment: 27ft Note: downgraded due to impaired use of LUE from previous CVA

## 2021-06-08 NOTE — Progress Notes (Signed)
Physical Therapy Session Note  Patient Details  Name: Dalton Miller MRN: [id number] Date of Birth: 02-10-1945  Today's Date: 06/08/2021 PT Individual Time: 0935-1006 PT Individual Time Calculation (min): 31 min   Today's Date: 06/08/2021 PT Missed Time: 29 Minutes Missed Time Reason: Pain  Short Term Goals: Week 1:  PT Short Term Goal 1 (Week 1): Pt will complete bed mobility with min A consistently PT Short Term Goal 2 (Week 1): Pt will complete least restrictive transfer with mod A consistently PT Short Term Goal 3 (Week 1): Pt will initiate gait training PT Short Term Goal 4 (Week 1): Pt will propel w/c x 50 ft with min A  Skilled Therapeutic Interventions/Progress Updates:   Received pt sitting in WC with NT present preparing to assist pt back to bed. Encouraged participation in scheduled therapy session, however pt declined stating "oh no honey, I can't do it today" and requesting to return to bed. Pt reports that he "overdid it" over the weekend and is "too sore" today. Pt reported 8/10 pain at L hip but per RN, pt not due for more pain medication until after 12pm. Pt transferred WC<>bed stand<>pivot with RW and min A - noted decreased weight shifting to LLE. Pt transferred sit<>supine with min A for LLE management and scooted to HOB pulling with BUEs on headboard. Pt agreeable to therapist providing HEP - provided pt with HEP and educated on frequency/duration/technique for the following exercises: - Supine Bridge  - 1 x daily - 7 x weekly - 2 sets - 10 reps - Clamshell  - 1 x daily - 7 x weekly - 2 sets - 10 reps - Seated Long Arc Quad  - 1 x daily - 7 x weekly - 2 sets - 10 reps - Seated March  - 1 x daily - 7 x weekly - 2 sets - 10 reps - Supine Heel Slide  - 1 x daily - 7 x weekly - 2 sets - 10 reps - Seated Heel Raise  - 1 x daily - 7 x weekly - 3 sets - 10 reps Concluded session with pt semi-reclined in bed, needs within reach, and bed alarm on. Provided pt with ice pack for L hip,  elevated BLEs on pillow, and provided coffee. 29 minutes missed of skilled physical therapy due to pain.   Therapy Documentation Precautions:  Precautions Precautions: Fall Restrictions Weight Bearing Restrictions: Yes LLE Weight Bearing: Weight bearing as tolerated  Therapy/Group: Individual Therapy Alfonse Alpers PT, DPT  06/08/2021, 7:01 AM

## 2021-06-09 DIAGNOSIS — I10 Essential (primary) hypertension: Secondary | ICD-10-CM | POA: Diagnosis not present

## 2021-06-09 DIAGNOSIS — Z7409 Other reduced mobility: Secondary | ICD-10-CM | POA: Diagnosis not present

## 2021-06-09 DIAGNOSIS — R339 Retention of urine, unspecified: Secondary | ICD-10-CM | POA: Diagnosis not present

## 2021-06-09 DIAGNOSIS — S72001A Fracture of unspecified part of neck of right femur, initial encounter for closed fracture: Secondary | ICD-10-CM | POA: Diagnosis not present

## 2021-06-09 LAB — GLUCOSE, CAPILLARY
Glucose-Capillary: 153 mg/dL — ABNORMAL HIGH (ref 70–99)
Glucose-Capillary: 165 mg/dL — ABNORMAL HIGH (ref 70–99)
Glucose-Capillary: 119 mg/dL — ABNORMAL HIGH (ref 70–99)

## 2021-06-09 MED ORDER — METFORMIN HCL 500 MG PO TABS
250.0000 mg | ORAL_TABLET | Freq: Every day | ORAL | Status: DC
  Administered 2021-06-09 – 2021-06-17 (×9): 250 mg via ORAL
  Filled 2021-06-09 (×9): qty 1

## 2021-06-09 MED ORDER — GABAPENTIN 100 MG PO CAPS
100.0000 mg | ORAL_CAPSULE | Freq: Three times a day (TID) | ORAL | Status: DC
  Administered 2021-06-09 – 2021-06-10 (×3): 100 mg via ORAL
  Filled 2021-06-09 (×4): qty 1

## 2021-06-09 NOTE — Progress Notes (Signed)
Occupational Therapy Session Note  Patient Details  Name: Dalton Miller MRN: [id number] Date of Birth: 06/06/1945  Today's Date: 06/09/2021 OT Individual Time: 1100-1200 OT Individual Time Calculation (min): 60 min    Short Term Goals: Week 1:  OT Short Term Goal 1 (Week 1): Pt will complete sit > stand in prep for ADL with min A using LRAD OT Short Term Goal 2 (Week 1): Pt will utilize LUE at diminished level for ADL tasks with supervision OT Short Term Goal 3 (Week 1): Pt will tolerate at least 2 mins of standing using LRAD and mod A for balance to promote endurance needed for toileting/LB self-care OT Short Term Goal 4 (Week 1): Pt will use AE PRN for threading of BLE into pants with no more than min A for seated balance  Skilled Therapeutic Interventions/Progress Updates:    S: Reports left hip pain which started on Saturday. Agreed to try an ice pack on hip while completing therapy session.   O: - While seated, Pt utilized 1lb wrist weight LUE and 1.5lb wrist weight RUE to complete BUE strengthening, core strength, and visual scanning task, 4 trials completed of 1' each. - Completed resistive strengthening while seated holding 1'# dowel rod with red band ; shoulder retraction, extension, 10X, 2 sets. - Proximal shoulder strengthening: seated, chest press, circles (left/right), overhead press, 12X - Strengthening: BUE, elbow flexion/extension, 15X, seated. Bilateral Shoulder IR/er, 15X   A: Skilled OT session focused on BUE strengthening and endurance in order increase independence with self care. Rest breaks provided during session as needed. Pain level monitored in left hip. VC for form and technique were provided during session.  P: Continue to work on increasing UB strength in order to increase functional performance during ADL tasks.    Therapy Documentation Precautions:  Precautions Precautions: Fall Restrictions Weight Bearing Restrictions: Yes LLE Weight Bearing: Weight  bearing as tolerated  Pain:  5/10 pain score in leg hip. Medicated earlier this AM.    Therapy/Group: Individual Therapy  Ailene Ravel, OTR/L,CBIS  Supplemental OT - [facility]  06/09/2021, 8:00 AM

## 2021-06-09 NOTE — Progress Notes (Addendum)
Occupational Therapy Weekly Progress Note  Patient Details  Name: Dalton Miller MRN: [id number] Date of Birth: [birth year]  Beginning of progress report period: Jun 02, 2021 End of progress report period: June 09, 2021   Patient has met 4 of 4 short term goals.  Pt is making great progress towards LTGs. Pt has progressed functional transfers from dependent in stedy to min-mod A ambulatory using RW, is able to bathe at an overall supervision level with use of AE, dress at an overall mod A level using AE, and requires mod assist for toileting tasks. AE has been educated on and utilized throughout sessions to improve pt's efficiency with ADL tasks and really enjoys the use of them. Pt continues to experience high levels of pain, which has impacted therapy intensity, with time spent educating pt on pain management techniques to promote maximum therapy participation. Pt continues to demonstrate LLE, LUE and abdominal weakness during bed mobility, functional transfers and ADL tasks, and needs further skilled OT intervention to maximize his independence prior to d/c. Family would benefit from family training as pt is likely to need at least supervision for functional transfers and up to min A for select self-care tasks at baseline, as well as to discuss home management for maximum safety outcomes.   Patient continues to demonstrate the following deficits: muscle weakness and muscle joint tightness, decreased cardiorespiratoy endurance, decreased coordination, and decreased sitting balance and decreased standing balance and therefore will continue to benefit from skilled OT intervention to enhance overall performance with BADL, iADL, and Reduce care partner burden.  Patient progressing toward long term goals..  LTG's upgraded 2/2 improved balance, strength and awareness; see goal revision.  OT Short Term Goals Week 1:  OT Short Term Goal 1 (Week 1): Pt will complete sit > stand in prep for ADL with min A using  LRAD OT Short Term Goal 1 - Progress (Week 1): Met OT Short Term Goal 2 (Week 1): Pt will utilize LUE at diminished level for ADL tasks with supervision OT Short Term Goal 2 - Progress (Week 1): Met OT Short Term Goal 3 (Week 1): Pt will tolerate at least 2 mins of standing using LRAD and mod A for balance to promote endurance needed for toileting/LB self-care OT Short Term Goal 3 - Progress (Week 1): Met OT Short Term Goal 4 (Week 1): Pt will use AE PRN for threading of BLE into pants with no more than min A for seated balance OT Short Term Goal 4 - Progress (Week 1): Met Week 2:  OT Short Term Goal 1 (Week 2): STG = LTG 2/2 ELOS   Airabella Barley E Loy Mccartt, MS, OTR/L  06/09/2021, 7:33 AM

## 2021-06-09 NOTE — Progress Notes (Signed)
PROGRESS NOTE   Subjective/Complaints: No new complaints this morning Dalton Glad RN notes that pain has been more radiating than localized to the hip- discussed with patient and he is in agreement with this, and notes pain to be burning and tingling  ROS: + left hip soreness, +voiding well, sleeping well, denies diarrhea, +burning and tingling in left leg  Objective:   No results found. Recent Labs    06/07/21 0627  WBC 4.5  HGB 7.4*  HCT 22.9*  PLT 153    Recent Labs    06/08/21 0542  NA 135  K 4.1  CL 104  CO2 24  GLUCOSE 171*  BUN 21  CREATININE 1.12  CALCIUM 8.6*     Intake/Output Summary (Last 24 hours) at 06/09/2021 1046 Last data filed at 06/09/2021 0904 Gross per 24 hour  Intake 237 ml  Output 850 ml  Net -613 ml        Physical Exam: Vital Signs Blood pressure 111/62, pulse 68, temperature 98 F (36.7 C), resp. rate 17, height 5\' 7"  (1.702 m), weight 71.6 kg, SpO2 91 %.  Vitals and nursing note reviewed.  Constitutional:      Appearance: Normal appearance.     Comments: Sitting up in bed; snoozing off and on; no foley in; appears stated age, NAD, BMI 24.72. Fatigued HENT:     Head: Normocephalic and atraumatic.     Comments: Mild L facial droop    Right Ear: External ear normal.     Left Ear: External ear normal.     Nose: Nose normal. No congestion.     Mouth/Throat:     Mouth: Mucous membranes are dry.     Pharynx: Oropharynx is clear. No oropharyngeal exudate.  Eyes:     General:        Right eye: No discharge.        Left eye: No discharge.     Extraocular Movements: Extraocular movements intact.  Cardiovascular:     Rate and Rhythm: Normal rate and regular rhythm.     Heart sounds: Normal heart sounds. No murmur heard.   No gallop.  Pulmonary:     Effort: Pulmonary effort is normal. No respiratory distress.     Breath sounds: Rhonchi present. No wheezing or rales.  Abdominal:      Palpations: Abdomen is soft.     Comments: NT, ND, hypoactive BS- just had BM  Musculoskeletal:        General: Swelling present.     Cervical back: Neck supple. No tenderness.     Comments: RUE 5/5 LUE 4/5 due to previous stroke RLE 5/5 LLE- HF 2/5; 4/5 in KE/DF and PF L hip moderate swelling around incision laterally on L hip, incision is c/d/i Functional mobility: transferring to WC with RW MinA Skin:    General: Skin is warm and dry.     Comments: Multiple healed skin tears on bilateral forearms.  Also has surgical dressing on L lateral hip- moderate bruising and edema- no erythema  Neurological:     Mental Status: He is alert and oriented to person, place, and time.     Comments: Mild left facial weakness with  LUE weakness. LLE limited due to pain. He was aware of being at [facility] and situation but unable to recall day, month or year.  Decreased Orientation as above   Psychiatric:        Mood and Affect: Mood normal.        Behavior: Behavior normal. Fear of pain   Functional mobility: sit to stand with MinA Motivated for therapy  Assessment/Plan: 1. Functional deficits which require 3+ hours per day of interdisciplinary therapy in a comprehensive inpatient rehab setting. Physiatrist is providing close team supervision and 24 hour management of active medical problems listed below. Physiatrist and rehab team continue to assess barriers to discharge/monitor patient progress toward functional and medical goals  Care Tool:  Bathing    Body parts bathed by patient: Right arm, Left arm, Chest, Abdomen, Front perineal area, Right upper leg, Left upper leg, Right lower leg, Left lower leg, Face, Buttocks   Body parts bathed by helper: Buttocks     Bathing assist Assist Level: Supervision/Verbal cueing     Upper Body Dressing/Undressing Upper body dressing   What is the patient wearing?: Pull over shirt    Upper body assist Assist Level: Supervision/Verbal cueing    Lower  Body Dressing/Undressing Lower body dressing      What is the patient wearing?: Pants     Lower body assist Assist for lower body dressing: Moderate Assistance - Patient 50 - 74%     Toileting Toileting    Toileting assist Assist for toileting: Contact Guard/Touching assist     Transfers Chair/bed transfer  Transfers assist     Chair/bed transfer assist level: Minimal Assistance - Patient > 75%     Locomotion Ambulation   Ambulation assist   Ambulation activity did not occur: Safety/medical concerns  Assist level: Minimal Assistance - Patient > 75% Assistive device: Walker-rolling Max distance: 3ft   Walk 10 feet activity   Assist  Walk 10 feet activity did not occur: Safety/medical concerns  Assist level: Minimal Assistance - Patient > 75% Assistive device: Walker-rolling   Walk 50 feet activity   Assist Walk 50 feet with 2 turns activity did not occur: Safety/medical concerns  Assist level: Minimal Assistance - Patient > 75% Assistive device: Walker-rolling    Walk 150 feet activity   Assist Walk 150 feet activity did not occur: Safety/medical concerns         Walk 10 feet on uneven surface  activity   Assist Walk 10 feet on uneven surfaces activity did not occur: Safety/medical concerns         Wheelchair     Assist Is the patient using a wheelchair?: Yes Type of Wheelchair: Manual    Wheelchair assist level: Minimal Assistance - Patient > 75% Max wheelchair distance: 60ft    Wheelchair 50 feet with 2 turns activity    Assist        Assist Level: Moderate Assistance - Patient 50 - 74%   Wheelchair 150 feet activity     Assist      Assist Level: Total Assistance - Patient < 25%   Blood pressure 111/62, pulse 68, temperature 98 F (36.7 C), resp. rate 17, height 5\' 7"  (1.702 m), weight 71.6 kg, SpO2 91 %.    Medical Problem List and Plan: 1. Functional deficits secondary to L hip fx in setting of  previous L hemiparesis             -patient may  shower             -  ELOS/Goals: 14-16 days mod I to supervision  Continue CIR  -Interdisciplinary Team Conference today   2.  Impaired mobility and ADLs: continue Eliquis 2.5mg  BID because was refusing Lovenox shots. Discussed with patient.  3. Left hip pain: continue Tylenol and/or oxycodone prn. Add Gabapentin 100mg  TID. Discussed scheduling Tylenol but he does not find tylenol helpful. Continue ice.  4. Insomnia: continue Trazodone 50mg  HS 5. Neuropsych: This patient is capable of making decisions on his own behalf. 6. Skin/Wound Care: Surgical dressing to stay in place for 2 weeks or till f/u with ortho             --routine pressure relief measures.  7. Hypoalbuminemia: d/c Juven given hyperglycemia. Encourage high protein foods.   8. Cirrhosis of the liver: Thrombocytopenia noted at admission 93-->72-->88. Monitor for signs of bleeding.              --discussed that Ammonia is stable. Continue lactulose             --continue Rifaximin  9. Hyponatremia: Multifactorial. Will recheck in am. 10. T2DM: Hgb A1c- 6.4 and diet controlled.  --Poorly controlled post op likely due to stress and immobility.              --decrease CBGS checks to [facility] and use SSI for elevated BS             --change diet to Carb Modified.   -add metformin 250mg  with lunch.  11. Constipation: Has not had BM since admission. Has been refusing laxatives per nursing.  12. H/o melanoma/Lung CA w/mets/COPD: Encourage tobacco cessation.             --respiratory status stable. Not on MDI at home.  13. H/o GIB: continue PPI bid.  14. Insomnia: On Trazodone and melatonin.  15. HTN: Monitor BP TID--continue Norvasc daily. Discontinue Flomax 0.4mg  HS 16. Low grade fever: Encourage IS.  17. Urinary retention: Will monitor voiding with PVR checks. discontinue Flomax 0.4mg  HS             --toilet every 4 hours.  18. L-MCA stroke: On ASA for stroke prophylaxis--resume.  19.  Constipation: Resolved             --Continue Lactulose for OIC.   LOS: 8 days A FACE TO FACE EVALUATION WAS PERFORMED  Dalton Miller Dalton Miller 06/09/2021, 10:46 AM

## 2021-06-09 NOTE — Patient Care Conference (Signed)
Inpatient RehabilitationTeam Conference and Plan of Care Update Date: 06/09/2021   Time: 11:53 AM    Patient Name: Dalton Miller      Medical Record Number: [id number]  Date of Birth: [birth year] Sex: Male         Room/Bed: 4W22C/4W22C-01 Payor Info: Payor: [employer] / Plan: [place] / Product Type: *No Product type* /    Admit Date/Time:  06/01/2021 12:58 PM  Primary Diagnosis:  Hip fracture requiring operative repair [facility])  Hospital Problems: Principal Problem:   Hip fracture requiring operative repair [facility])    Expected Discharge Date: Expected Discharge Date: 06/18/21  Team Members Present: Physician leading conference: Dr. Leeroy Cha Social Worker Present: Erlene Quan, BSW Nurse Present: Dorien Chihuahua, RN PT Present: Becky Sax, PT OT Present: Jennefer Bravo, OT PPS Coordinator present : Ileana Ladd, PT     Current Status/Progress Goal Weekly Team Focus  Bowel/Bladder   Continent of Bowel and Bladder. LBM 06/07/21  Remain continent      Swallow/Nutrition/ Hydration             ADL's   supervision bathing with LH sponge, min A UB dressing, Max A LB dressing, Mod A toileting, min-mod A ambulatory toilet transfers using RW  min A per baseline for bathing/dressing with mod I/supervision for transfers  endurance, ADL retraining, AE education, LUE NMR   Mobility   supine<>sit mod A, transfers with RW min A (mod A from low surfaces inclduing WC), gait 72ft with RW and min A  supervision/min A (will upgrade to supervision overall)  functional mobility/transfers, generalized strengthening and endurance, dynamic standing balance/coordination, pain management, D/C planning, and gait training.   Communication             Safety/Cognition/ Behavioral Observations            Pain   Denies pain but has prn oxicodone when needed.  <3/10. Assess Q shift and prn.      Skin   Surgical incision to left Hip.  Promote wound healing. Follow  dressing change plan.  Promote healing. Assess Q shift.     Discharge Planning:  Patient lives with his children and plans on retrning. Children able to provide 24/7   Team Discussion: Patient is doing well; pain addressed.  Patient on target to meet rehab goals: yes, currently needs min assist for sit -stand and sit-supine and ambulation. Needs min assist for upper body care and lower body care with adaptive equipment. Goals for discharge set for supervision - min assist.  *See Care Plan and progress notes for long and short-term goals.   Revisions to Treatment Plan:  Upgraded goals to supervision level   Teaching Needs: Safety, skin care, medication management (family manage), secondary risk management, etc  Current Barriers to Discharge: Decreased caregiver support and Home enviroment access/layout  Possible Resolutions to Barriers: Family education DME: hospital bed, 3N1; already has TTB and RW HH follow up services     Medical Summary Current Status: pain limited, moving bowels regularly, type 2 DM with hyperglycemia  Barriers to Discharge: Medical stability  Barriers to Discharge Comments: pain limited, type 2 DM with hyperglycemia Possible Resolutions to [employer] Focus: continue opioids as needed, discussed nature of pain, added gabapentin 100mg  three times per day, decreae CBGs to daily, increase metformin   Continued Need for Acute Rehabilitation Level of Care: The patient requires daily medical management by a physician with specialized training in physical medicine and rehabilitation for the following  reasons: Direction of a multidisciplinary physical rehabilitation program to maximize functional independence : Yes Medical management of patient stability for increased activity during participation in an intensive rehabilitation regime.: Yes Analysis of laboratory values and/or radiology reports with any subsequent need for medication adjustment and/or medical  intervention. : Yes   I attest that I was present, lead the team conference, and concur with the assessment and plan of the team.   Dorien Chihuahua B 06/09/2021, 2:53 PM

## 2021-06-09 NOTE — Progress Notes (Signed)
Slept well last night. Dressing change to Lt hip done. Denies pain this shift. Continent of urine, good volumes. CBG monitored per orders. Safety maintained.

## 2021-06-09 NOTE — Progress Notes (Signed)
Patient ID: Dalton Miller, male   DOB: 1945/05/09, 76 y.o.   MRN: [id number]  Team Conference Report to Patient/Family  Team Conference discussion was reviewed with the patient and caregiver, including goals, any changes in plan of care and target discharge date.  Patient and caregiver express understanding and are in agreement.  The patient has a target discharge date of 06/18/21.  Sw met with patient and spoke with Daughter via telephone. Sw provided patient and daughter with team conference updates. Patient making good progress just limited by pain. Patient started new pain medication today and was educated on requesting pain meds by therapist. Daughter informed Sw that they are in the process of obtaining DME through the [place]. Sw will assist with this process and send orders.  Dyanne Iha 06/09/2021, 2:00 PM

## 2021-06-09 NOTE — Progress Notes (Signed)
Physical Therapy Weekly Progress Note  Patient Details  Name: LESLIE LANGILLE MRN: [id number] Date of Birth: 04-12-1945  Beginning of progress report period: Jun 02, 2021 End of progress report period: June 09, 2021  Patient has met 3 of 4 short term goals. Pt demonstrates slow but steady progress towards long term goals. Pt is currently able to transfer semi-reclined<>sitting EOB with mod A and use of bed features and sit<>supine with min A with assist for LLE management and trunk control. Pt is able to perform transfers with min A using RW (light mod A from low surfaces), and ambulate up to 76f with RW and min A. Pt continues to be limited by high pain levels, generalized weakness/deconditioning, and L hemiparesis from prior CVA.  Patient continues to demonstrate the following deficits muscle weakness and muscle joint tightness, decreased cardiorespiratoy endurance, abnormal tone, and decreased standing balance, decreased postural control, hemiplegia, and decreased balance strategies and therefore will continue to benefit from skilled PT intervention to increase functional independence with mobility.  Patient progressing toward long term goals..  Continue plan of care.  PT Short Term Goals Week 1:  PT Short Term Goal 1 (Week 1): Pt will complete bed mobility with min A consistently PT Short Term Goal 1 - Progress (Week 1): Progressing toward goal PT Short Term Goal 2 (Week 1): Pt will complete least restrictive transfer with mod A consistently PT Short Term Goal 2 - Progress (Week 1): Met PT Short Term Goal 3 (Week 1): Pt will initiate gait training PT Short Term Goal 3 - Progress (Week 1): Met PT Short Term Goal 4 (Week 1): Pt will propel w/c x 50 ft with min A PT Short Term Goal 4 - Progress (Week 1): Met Week 2:  PT Short Term Goal 1 (Week 2): STG=LTG due to LOS  Skilled Therapeutic Interventions/Progress Updates:  Ambulation/gait training;Balance/vestibular training;Community  reintegration;Discharge planning;Disease management/prevention;DME/adaptive equipment instruction;Functional mobility training;Neuromuscular re-education;Pain management;Patient/family education;Therapeutic Activities;Therapeutic Exercise;UE/LE Strength taining/ROM;UE/LE Coordination activities;Wheelchair propulsion/positioning   Therapy Documentation Precautions:  Precautions Precautions: Fall Restrictions Weight Bearing Restrictions: Yes LLE Weight Bearing: Weight bearing as tolerated  Therapy/Group: Individual Therapy ABlenda Nicely6/07/2021, 7:17 AM

## 2021-06-09 NOTE — Progress Notes (Signed)
Occupational Therapy Session Note  Patient Details  Name: Dalton Miller MRN: [id number] Date of Birth: [birth year]  Today's Date: 06/09/2021 OT Individual Time: 6578-4696 OT Individual Time Calculation (min): 70 min    Short Term Goals: Week 1:  OT Short Term Goal 1 (Week 1): Pt will complete sit > stand in prep for ADL with min A using LRAD OT Short Term Goal 2 (Week 1): Pt will utilize LUE at diminished level for ADL tasks with supervision OT Short Term Goal 3 (Week 1): Pt will tolerate at least 2 mins of standing using LRAD and mod A for balance to promote endurance needed for toileting/LB self-care OT Short Term Goal 4 (Week 1): Pt will use AE PRN for threading of BLE into pants with no more than min A for seated balance  Skilled Therapeutic Interventions/Progress Updates:  Skilled OT intervention completed with focus on tub/shower transfers, LUE fine motor coordination and NMR. Pt received upright in bed, 5/10 pain in L hip, agreeable to session. Transported dependently in w/c from room > ADL apartment > gym. In ADL bathroom, education provided on DME recommended for shower transfer that pt already has at home, suggested use of [facility] for ease of bathing, shower curtain management to prevent water spillage, minimizing stands via lateral leans for pericare, use of grab bars/installation as well as effective safety strategies for exiting the shower to eliminate falls. Pt reporting he plans to renovate his bathroom to walk in shower with suggestions offered about how to maximize safety if shower changes. Pt completed 10 step ambulatory transfer using RW <> tub bench with min A, then min A needed for LLE over threshold. Pt with difficulty scooting once on bench 2/2 lack of hip clearance.  In gym, seated at table, completed 9 hole peg tests to assess/promote fine motor coordination needed for ADL tasks with the following results: R hand- 34.30 L hand- >3 mins for 3 pegs, therefore discontinued task of  placing pegs and transitioned to therapist placing pegs and pt removing with min difficulty but greater success.   Using tan/super soft putty, pt completed the following on L hand: Squeezing into ball for grip strengthening, rolling into long tube for finger extension, pincer pinching along the tube for fine motor, twirling the tube into "cinnamon roll" for radial/ulnar deviation. Cues needed for technique, with pt easily distracted in gym, however educated pt on use in room for HEP and when concentration can be higher.  Pt was left seated in w/c, with chair alarm on and all needs in reach at end of session.    Therapy Documentation Precautions:  Precautions Precautions: Fall Restrictions Weight Bearing Restrictions: Yes LLE Weight Bearing: Weight bearing as tolerated    Therapy/Group: Individual Therapy  Blase Mess, MS, OTR/L  06/09/2021, 7:31 AM

## 2021-06-09 NOTE — Plan of Care (Signed)
  Problem: RH Balance Goal: LTG Patient will maintain dynamic standing with ADLs (OT) Description: LTG:  Patient will maintain dynamic standing balance with assist during activities of daily living (OT)  Flowsheets (Taken 06/09/2021 0742) LTG: Pt will maintain dynamic standing balance during ADLs with: (upgraded) Supervision/Verbal cueing Note: Upgraded 2/2 increased strength, balance and awareness   Problem: Sit to Stand Goal: LTG:  Patient will perform sit to stand in prep for activites of daily living with assistance level (OT) Description: LTG:  Patient will perform sit to stand in prep for activites of daily living with assistance level (OT) Flowsheets (Taken 06/09/2021 0742) LTG: PT will perform sit to stand in prep for activites of daily living with assistance level: (upgraded) Supervision/Verbal cueing Note: Upgraded 2/2 increased strength, balance and awareness   Problem: RH Bathing Goal: LTG Patient will bathe all body parts with assist levels (OT) Description: LTG: Patient will bathe all body parts with assist levels (OT) Flowsheets (Taken 06/09/2021 0742) LTG: Pt will perform bathing with assistance level/cueing: (upgraded) Supervision/Verbal cueing Note: Upgraded 2/2 increased strength, balance and awareness   Problem: RH Dressing Goal: LTG Patient will perform lower body dressing w/assist (OT) Description: LTG: Patient will perform lower body dressing with assist, with/without cues in positioning using equipment (OT) Flowsheets (Taken 06/09/2021 0742) LTG: Pt will perform lower body dressing with assistance level of: (upgraded) Supervision/Verbal cueing Note: Upgraded 2/2 increased strength, balance and awareness   Problem: RH Toileting Goal: LTG Patient will perform toileting task (3/3 steps) with assistance level (OT) Description: LTG: Patient will perform toileting task (3/3 steps) with assistance level (OT)  Flowsheets (Taken 06/09/2021 0742) LTG: Pt will perform toileting  task (3/3 steps) with assistance level: (upgraded) Supervision/Verbal cueing Note: Upgraded 2/2 increased strength, balance and awareness   Problem: RH Toilet Transfers Goal: LTG Patient will perform toilet transfers w/assist (OT) Description: LTG: Patient will perform toilet transfers with assist, with/without cues using equipment (OT) Flowsheets (Taken 06/09/2021 0742) LTG: Pt will perform toilet transfers with assistance level of: (upgraded) Contact Guard/Touching assist Note: Upgraded 2/2 increased strength, balance and awareness

## 2021-06-09 NOTE — Progress Notes (Signed)
Physical Therapy Session Note  Patient Details  Name: Dalton Miller MRN: [id number] Date of Birth: 11/05/1945  Today's Date: 06/09/2021 PT Individual Time: 4827-0786 PT Individual Time Calculation (min): 55 min   Short Term Goals: Week 1:  PT Short Term Goal 1 (Week 1): Pt will complete bed mobility with min A consistently PT Short Term Goal 2 (Week 1): Pt will complete least restrictive transfer with mod A consistently PT Short Term Goal 3 (Week 1): Pt will initiate gait training PT Short Term Goal 4 (Week 1): Pt will propel w/c x 50 ft with min A  Skilled Therapeutic Interventions/Progress Updates:   Received pt supine in bed, pt agreeable to PT treatment, and reported pain 8/10 in L hip - RN notified and present to administer pain medication and repositioning, rest breaks, and activity modification done to reduce pain levels. Session with emphasis on functional mobility/transfers, generalized strengthening and endurance, and dynamic standing balance/coordination. Pt transferred semi-reclined<>sitting EOB with HOB elevated and use of bedrails with supervision with increased time and effort. Pt transferred sit<>stand with RW and min A for RN to inspect buttocks. Stand<>pivot bed<>WC with RW and CGA and transported to/from room in [facility] dependently for time management and energy conservation purposes. Pt performed seated BLE strengthening on Kinetron at 50 cm/sec for 1 minute x 4 trials with emphasis on quad/glute strength and L hip ROM. Pt politely declined ambulation this afternoon due to pain but agreeable to seated exercises. Pt performed the following exercises with supervision and verbal cues for technique: -hip adduction ball squeezes 2x10 -LAQ 2x15 on RLE with 2.5lb ankle weight and 2x10 on LLE unweighted -hip flexion 2x15 on RLE with 2.5lb ankle weight and 2x8 on LLE unweighted -heel raises 2x20 -LLE heel slides 2x15 Returned to room and pt requested to return to bed. Stand<>pivot WC<>bed  with RW and min A and sit<>supine with min A for LLE management. Concluded session with pt semi-reclined in bed, needs within reach, and bed alarm on. Provided pt with ice pack for L hip and fresh drinks.   Therapy Documentation Precautions:  Precautions Precautions: Fall Restrictions Weight Bearing Restrictions: Yes LLE Weight Bearing: Weight bearing as tolerated  Therapy/Group: Individual Therapy Alfonse Alpers PT, DPT  06/09/2021, 7:06 AM

## 2021-06-10 DIAGNOSIS — S72001A Fracture of unspecified part of neck of right femur, initial encounter for closed fracture: Secondary | ICD-10-CM | POA: Diagnosis not present

## 2021-06-10 DIAGNOSIS — I10 Essential (primary) hypertension: Secondary | ICD-10-CM | POA: Diagnosis not present

## 2021-06-10 DIAGNOSIS — Z7409 Other reduced mobility: Secondary | ICD-10-CM | POA: Diagnosis not present

## 2021-06-10 DIAGNOSIS — R339 Retention of urine, unspecified: Secondary | ICD-10-CM | POA: Diagnosis not present

## 2021-06-10 LAB — GLUCOSE, CAPILLARY
Glucose-Capillary: 123 mg/dL — ABNORMAL HIGH (ref 70–99)
Glucose-Capillary: 128 mg/dL — ABNORMAL HIGH (ref 70–99)
Glucose-Capillary: 124 mg/dL — ABNORMAL HIGH (ref 70–99)
Glucose-Capillary: 112 mg/dL — ABNORMAL HIGH (ref 70–99)

## 2021-06-10 MED ORDER — GABAPENTIN 100 MG PO CAPS
200.0000 mg | ORAL_CAPSULE | Freq: Three times a day (TID) | ORAL | Status: DC
  Administered 2021-06-10 – 2021-06-16 (×18): 200 mg via ORAL
  Filled 2021-06-10 (×18): qty 2

## 2021-06-10 NOTE — Progress Notes (Signed)
Occupational Therapy Session Note  Patient Details  Name: CALE BETHARD MRN: [id number] Date of Birth: 1945/09/16  Today's Date: 06/10/2021 OT Individual Time: 5277-8242 OT Individual Time Calculation (min): 75 min    Short Term Goals: Week 2:  OT Short Term Goal 1 (Week 2): STG = LTG 2/2 ELOS  Skilled Therapeutic Interventions/Progress Updates:    S: Pt reports pain in left hip when attempting to pull up pants while standing to use urinal.   O: -Functional transfer and Bed mobility completed with Min Assist. Utilized RW while providing tactile and VC for form and technique. - Utilized urinal standing at bedside with RW for balance. Min assist provided for safety while standing. Pt unable to pull pants over hips due to pain and inability to maintain an upright posture. Provided assist while patient received VC to maintain upright posture with techniques given. - BUE strengthening: seated, unilateral movements performed, shoulder flexion (to 90), abduction (to 90), overhead press, protraction, horizontal abduction/adduction; 12X 3# hand weight held in right hand. 1lb wrist weight used on left UE. - Completed 10 seated chair tricep push ups with Min assist and VC for form and technique in order to progress independence and functional performance during sit to stands outside of therapy and when at home. - Standing tolerance task completed using RW at table with SBA in order to increase tolerance during self care tasks. Pt was able to tolerate standing for ~3 minutes.   A: Pt continues to verbalize left hip pain when completing any weightbearing or mobility movements. Rest breaks were provided during session. Pain management techniques were implemented during session. Pt was provided with verbal and visual cues during exercises for form and technique.   P: Continue to work on decreasing pain in left leg in order to increase functional performance during self care and functional  transfers.   Therapy Documentation Precautions:  Precautions Precautions: Fall Restrictions Weight Bearing Restrictions: Yes LLE Weight Bearing: Weight bearing as tolerated  Pain: 5/10 pain left hip during activity with therapy. Pain meds requested and received during session. Ice pack applied to left hip during therapy session and pain was monitored throughout.  Therapy/Group: Individual Therapy  Ailene Ravel, OTR/L,CBIS  Supplemental OT - [place] [facility]  06/10/2021, 7:55 AM

## 2021-06-10 NOTE — Progress Notes (Signed)
Physical Therapy Session Note  Patient Details  Name: Dalton Miller MRN: [id number] Date of Birth: [birth year]  Today's Date: 06/10/2021 PT Individual Time: 1430-1530 PT Individual Time Calculation (min): 60 min   Short Term Goals: Week 2:  PT Short Term Goal 1 (Week 2): STG=LTG due to LOS  Skilled Therapeutic Interventions/Progress Updates: Pt presents supine in bed and agreeable to therapy, although states increased pain recently.  Pt able to scoot self to EOB, but then reaches for assist from PT.  PT educated on use of Ues on bed and still requires min A.  Pt transfers sit to stand w/ min A and then step-pivots to w/c w/ min to CGA.  Pt states not putting much weight on LLE, "about 10%".  Cued for increased UE use for increased foot clearance RLE.  PT wheeled to main gym for time and pain tolerance.  Pt performed seated calf raises x 15, LAQ, hip flexion, abd/add all w/ 2.5# weight to RLE and 0# LLE, AAROM for hip flexion w/ increasing participation.  Pt performed multiple sit to stand transfers w/ min to CGA to perform clothes pin on vertical bar, using B UES.  Pt states attempting to place increased weight LLE but unable 2/2 pain.  Pt had one episode of L hand giving out when transferring stand to sit.  Pt returned to room and amb x 6' w/ RW and min A to bed, improved foot clearance and advancement RLE.  Pt returned to supine w/ CGA only.  Bed aarm on and all needs in reach.     Therapy Documentation Precautions:  Precautions Precautions: Fall Restrictions Weight Bearing Restrictions: Yes LLE Weight Bearing: Weight bearing as tolerated General:   Vital Signs: Therapy Vitals Temp: 98.2 F (36.8 C) Pulse Rate: 76 Resp: 18 BP: 122/68 Patient Position (if appropriate): Lying Oxygen Therapy SpO2: 98 % O2 Device: Room Air Pain:5/10 initially, increased to 7/10 w/ activity, especially WB LLE.       Therapy/Group: Individual Therapy  [name] 06/10/2021, 3:56 PM

## 2021-06-10 NOTE — Progress Notes (Signed)
Physical Therapy Session Note  Patient Details  Name: Dalton Miller MRN: [id number] Date of Birth: July 18, 1945  Today's Date: 06/10/2021 PT Individual Time: 0932-3557 PT Individual Time Calculation (min): 53 min   Short Term Goals: Week 1:  PT Short Term Goal 1 (Week 1): Pt will complete bed mobility with min A consistently PT Short Term Goal 1 - Progress (Week 1): Progressing toward goal PT Short Term Goal 2 (Week 1): Pt will complete least restrictive transfer with mod A consistently PT Short Term Goal 2 - Progress (Week 1): Met PT Short Term Goal 3 (Week 1): Pt will initiate gait training PT Short Term Goal 3 - Progress (Week 1): Met PT Short Term Goal 4 (Week 1): Pt will propel w/c x 50 ft with min A PT Short Term Goal 4 - Progress (Week 1): Met Week 2:  PT Short Term Goal 1 (Week 2): STG=LTG due to LOS  Skilled Therapeutic Interventions/Progress Updates:   Received pt sitting in recliner, pt agreeable to PT treatment, and reported pain 7/10 in L hip (premedicated) - repositioning, rest breaks, and distraction done to reduce pain levels. Pt reported discomfort with current cushion, therefore switched for different Roho cushion and pt reported relief. Session with emphasis on functional mobility/transfers, generalized strengthening and endurance, simulated car transfers, and gait training. Sit<>stand with RW and heavy min A from low sitting recliner and ambulated 73f with RW and CGA to WC - pt ambulates with significantly decreased cadence, decreased weight shifting to LLE, and decreased foot clearance L>R. [facility]. Pt reports his and his son's trucks both have running boards but that his brother in law has a truck without one - encouraged pt to speak with brother in law about borrowing his truck upon D/C. Pt performed simulated car transfer with RW and mod A overall - required assist for LLE management. [facility] to room, [facility], and ambulated 140fwith RW and CGA back to recliner. Placed new Roho cushion on recliner seat and concluded session with pt sitting in recliner, needs within reach, and seatbelt alarm on. Ice pack placed on L hip.   Therapy Documentation Precautions:  Precautions Precautions: Fall Restrictions Weight Bearing Restrictions: Yes LLE Weight Bearing: Weight bearing as tolerated  Therapy/Group: Individual Therapy AnAlfonse AlpersT, DPT  06/10/2021, 6:58 AM

## 2021-06-10 NOTE — Progress Notes (Signed)
PROGRESS NOTE   Subjective/Complaints: Continues to have pain radiating from hip to knee, feels burning and tingling. Discussed increasing gabapentin to 200mg  TID and he is agreeable.  He is content with d/c date  ROS: + left hip soreness, +voiding well, sleeping well, denies diarrhea, +burning and tingling in left leg- continues  Objective:   No results found. No results for input(s): "WBC", "HGB", "HCT", "PLT" in the last 72 hours.   Recent Labs    06/08/21 0542  NA 135  K 4.1  CL 104  CO2 24  GLUCOSE 171*  BUN 21  CREATININE 1.12  CALCIUM 8.6*     Intake/Output Summary (Last 24 hours) at 06/10/2021 1235 Last data filed at 06/10/2021 0300 Gross per 24 hour  Intake 117 ml  Output 450 ml  Net -333 ml        Physical Exam: Vital Signs Blood pressure 114/76, pulse 68, temperature 98.3 F (36.8 C), resp. rate 19, height 5\' 7"  (1.702 m), weight 71.6 kg, SpO2 93 %.  Vitals and nursing note reviewed.  Constitutional:      Appearance: Normal appearance.     Comments: Sitting up in bed; snoozing off and on; no foley in; appears stated age, NAD, BMI 24.72. Fatigued HENT:     Head: Normocephalic and atraumatic.     Comments: Mild L facial droop    Right Ear: External ear normal.     Left Ear: External ear normal.     Nose: Nose normal. No congestion.     Mouth/Throat:     Mouth: Mucous membranes are dry.     Pharynx: Oropharynx is clear. No oropharyngeal exudate.  Eyes:     General:        Right eye: No discharge.        Left eye: No discharge.     Extraocular Movements: Extraocular movements intact.  Cardiovascular:     Rate and Rhythm: Normal rate and regular rhythm.     Heart sounds: Normal heart sounds. No murmur heard.   No gallop.  Pulmonary:     Effort: Pulmonary effort is normal. No respiratory distress.     Breath sounds: Rhonchi present. No wheezing or rales.  Abdominal:     Palpations: Abdomen  is soft.     Comments: NT, ND, hypoactive BS- just had BM  Musculoskeletal:        General: Swelling present.     Cervical back: Neck supple. No tenderness.     Comments: RUE 5/5 LUE 4/5 due to previous stroke RLE 5/5 LLE- HF 2/5; 4/5 in KE/DF and PF L hip moderate swelling around incision laterally on L hip, incision is c/d/i Functional mobility: transferring to WC with RW MinA Skin:    General: Skin is warm and dry.     Comments: Multiple healed skin tears on bilateral forearms.  Also has surgical dressing on L lateral hip- moderate bruising and edema- no erythema  Neurological:     Mental Status: He is alert and oriented to person, place, and time.     Comments: Mild left facial weakness with LUE weakness. LLE limited due to pain. He was aware of being at  [facility] and situation but unable to recall day, month or year.  Decreased Orientation as above   Psychiatric:        Mood and Affect: Mood normal.        Behavior: Behavior normal. Fear of pain   Functional mobility: sit to stand with MinA, ambulating with RW CG Motivated for therapy  Assessment/Plan: 1. Functional deficits which require 3+ hours per day of interdisciplinary therapy in a comprehensive inpatient rehab setting. Physiatrist is providing close team supervision and 24 hour management of active medical problems listed below. Physiatrist and rehab team continue to assess barriers to discharge/monitor patient progress toward functional and medical goals  Care Tool:  Bathing    Body parts bathed by patient: Right arm, Left arm, Chest, Abdomen, Front perineal area, Right upper leg, Left upper leg, Right lower leg, Left lower leg, Face, Buttocks   Body parts bathed by helper: Buttocks     Bathing assist Assist Level: Supervision/Verbal cueing     Upper Body Dressing/Undressing Upper body dressing   What is the patient wearing?: Pull over shirt    Upper body assist Assist Level: Supervision/Verbal cueing    Lower  Body Dressing/Undressing Lower body dressing      What is the patient wearing?: Pants     Lower body assist Assist for lower body dressing: Moderate Assistance - Patient 50 - 74%     Toileting Toileting    Toileting assist Assist for toileting: Contact Guard/Touching assist     Transfers Chair/bed transfer  Transfers assist     Chair/bed transfer assist level: Contact Guard/Touching assist     Locomotion Ambulation   Ambulation assist   Ambulation activity did not occur: Safety/medical concerns  Assist level: Minimal Assistance - Patient > 75% Assistive device: Walker-rolling Max distance: 27ft   Walk 10 feet activity   Assist  Walk 10 feet activity did not occur: Safety/medical concerns  Assist level: Minimal Assistance - Patient > 75% Assistive device: Walker-rolling   Walk 50 feet activity   Assist Walk 50 feet with 2 turns activity did not occur: Safety/medical concerns  Assist level: Minimal Assistance - Patient > 75% Assistive device: Walker-rolling    Walk 150 feet activity   Assist Walk 150 feet activity did not occur: Safety/medical concerns         Walk 10 feet on uneven surface  activity   Assist Walk 10 feet on uneven surfaces activity did not occur: Safety/medical concerns         Wheelchair     Assist Is the patient using a wheelchair?: Yes Type of Wheelchair: Manual    Wheelchair assist level: Minimal Assistance - Patient > 75% Max wheelchair distance: 54ft    Wheelchair 50 feet with 2 turns activity    Assist        Assist Level: Moderate Assistance - Patient 50 - 74%   Wheelchair 150 feet activity     Assist      Assist Level: Total Assistance - Patient < 25%   Blood pressure 114/76, pulse 68, temperature 98.3 F (36.8 C), resp. rate 19, height 5\' 7"  (1.702 m), weight 71.6 kg, SpO2 93 %.    Medical Problem List and Plan: 1. Functional deficits secondary to L hip fx in setting of  previous L hemiparesis             -patient may  shower             -ELOS/Goals: 14-16 days mod I  to supervision  Continue CIR  2.  Impaired mobility and ADLs: continue Eliquis 2.5mg  BID because was refusing Lovenox shots. Discussed with patient.  3. Left hip pain: continue Tylenol and/or oxycodone prn. Increase Gabapentin to 200mg  TID. Discussed scheduling Tylenol but he does not find tylenol helpful. Continue ice.  4. Insomnia: continue Trazodone 50mg  HS 5. Neuropsych: This patient is capable of making decisions on his own behalf. 6. Skin/Wound Care: Surgical dressing to stay in place for 2 weeks or till f/u with ortho             --routine pressure relief measures.  7. Hypoalbuminemia: d/c Juven given hyperglycemia. Encourage high protein foods.   8. Cirrhosis of the liver: Thrombocytopenia noted at admission 93-->72-->88. Monitor for signs of bleeding.              --discussed that Ammonia is stable. Continue lactulose             --continue Rifaximin  9. Hyponatremia: Multifactorial. Will recheck in am. 10. T2DM: Hgb A1c- 6.4 and diet controlled.  --Poorly controlled post op likely due to stress and immobility.              --decrease CBGS checks to [facility] and use SSI for elevated BS             --change diet to Carb Modified.   -add metformin 250mg  with lunch.  11. Constipation: Has not had BM since admission. Has been refusing laxatives per nursing.  12. H/o melanoma/Lung CA w/mets/COPD: Encourage tobacco cessation.             --respiratory status stable. Not on MDI at home.  13. H/o GIB: continue PPI bid.  14. Insomnia: On Trazodone and melatonin.  15. HTN: Monitor BP TID--continue Norvasc daily. Discontinue Flomax 0.4mg  HS 16. Low grade fever: Encourage IS.  17. Urinary retention: Will monitor voiding with PVR checks. discontinue Flomax 0.4mg  HS             --toilet every 4 hours.  18. L-MCA stroke: On ASA for stroke prophylaxis--resume.  19. Constipation: Resolved              --Continue Lactulose for OIC.  20. Sinusitis: recommended bee pollen and local honey outpatient  LOS: 9 days A FACE TO FACE EVALUATION WAS PERFORMED  Dalton Miller 06/10/2021, 12:35 PM

## 2021-06-10 NOTE — Progress Notes (Signed)
Occupational Therapy Session Note  Patient Details  Name: DEVARIOUS PAVEK MRN: [id number] Date of Birth: [birth year]  Today's Date: 06/10/2021 OT Individual Time: 1130-1200 OT Individual Time Calculation (min): 30 min    Short Term Goals: Week 1:  OT Short Term Goal 1 (Week 1): Pt will complete sit > stand in prep for ADL with min A using LRAD OT Short Term Goal 1 - Progress (Week 1): Met OT Short Term Goal 2 (Week 1): Pt will utilize LUE at diminished level for ADL tasks with supervision OT Short Term Goal 2 - Progress (Week 1): Met OT Short Term Goal 3 (Week 1): Pt will tolerate at least 2 mins of standing using LRAD and mod A for balance to promote endurance needed for toileting/LB self-care OT Short Term Goal 3 - Progress (Week 1): Met OT Short Term Goal 4 (Week 1): Pt will use AE PRN for threading of BLE into pants with no more than min A for seated balance OT Short Term Goal 4 - Progress (Week 1): Met  Skilled Therapeutic Interventions/Progress Updates:    Pt resting in recliner upon arrival and agreeable to therapy. Pain as noted below. BUE therex for UB/UE strengthening to increase pt's independence with BADLs. 1.5# wrist weights-biceps curls 3x10, overhead presses 3x10, punches 3x10. 3#bar for straght arm raises 3x10. Pt tolerated well with no pain reported. Pt reamined seated in recliner with all needs within reach.   Therapy Documentation Precautions:  Precautions Precautions: Fall Restrictions Weight Bearing Restrictions: Yes LLE Weight Bearing: Weight bearing as tolerated   Pain: Pain Assessment Pain Score: 7; LLE (hip) premedicated   Therapy/Group: Individual Therapy  Leroy Libman 06/10/2021, 12:18 PM

## 2021-06-11 LAB — GLUCOSE, CAPILLARY
Glucose-Capillary: 123 mg/dL — ABNORMAL HIGH (ref 70–99)
Glucose-Capillary: 126 mg/dL — ABNORMAL HIGH (ref 70–99)

## 2021-06-11 MED ORDER — TAMSULOSIN HCL 0.4 MG PO CAPS
0.4000 mg | ORAL_CAPSULE | Freq: Every day | ORAL | Status: DC
  Administered 2021-06-12 – 2021-06-18 (×7): 0.4 mg via ORAL
  Filled 2021-06-11 (×7): qty 1

## 2021-06-11 MED ORDER — INSULIN ASPART 100 UNIT/ML IJ SOLN
0.0000 [IU] | Freq: Every day | INTRAMUSCULAR | Status: DC
  Administered 2021-06-12 – 2021-06-14 (×3): 1 [IU] via SUBCUTANEOUS
  Administered 2021-06-15: 2 [IU] via SUBCUTANEOUS
  Administered 2021-06-16 – 2021-06-18 (×3): 1 [IU] via SUBCUTANEOUS

## 2021-06-11 NOTE — Progress Notes (Signed)
PROGRESS NOTE   Subjective/Complaints: Pt reports pain is a little improved today. No new concerns.   ROS: + left hip soreness, +voiding well, sleeping well, denies diarrhea, +burning and tingling in left leg- continues. No CP or SOB.  Objective:   No results found. No results for input(s): "WBC", "HGB", "HCT", "PLT" in the last 72 hours.   No results for input(s): "NA", "K", "CL", "CO2", "GLUCOSE", "BUN", "CREATININE", "CALCIUM" in the last 72 hours.    Intake/Output Summary (Last 24 hours) at 06/11/2021 0852 Last data filed at 06/11/2021 0841 Gross per 24 hour  Intake 537 ml  Output 1679 ml  Net -1142 ml         Physical Exam: Vital Signs Blood pressure 114/60, pulse (!) 53, temperature 98.3 F (36.8 C), temperature source Oral, resp. rate 16, height 5\' 7"  (1.702 m), weight 71.6 kg, SpO2 97 %.  Vitals and nursing note reviewed.  Constitutional:      Appearance: Normal appearance.  Sitting in bed.    Comments:alert HENT:     Head: Normocephalic and atraumatic.     Comments: Mild L facial droop    Right Ear: External ear normal.     Left Ear: External ear normal.     Nose: Nose normal. No congestion.     Mouth/Throat:     Mouth: Mucous membranes are dry.     Pharynx: Oropharynx is clear. No oropharyngeal exudate.  Eyes:     General:        Right eye: No discharge.        Left eye: No discharge.     Extraocular Movements: Extraocular movements intact.  Cardiovascular:     Rate and Rhythm: Normal rate and regular rhythm.     Heart sounds: Normal heart sounds. No murmur heard.   No gallop.  Pulmonary:     Effort: Pulmonary effort is normal. No respiratory distress.     Breath sounds: CTAB No wheezing or rales.  Abdominal:     Palpations: Abdomen is soft.     Comments: NT, ND, hypoactive BS- just had BM  Musculoskeletal:        General: Swelling present.     Cervical back: Neck supple. No tenderness.      Comments: RUE 5/5 LUE 4/5 due to previous stroke RLE 5/5 LLE- HF 2/5; 4/5 in KE/DF and PF L hip moderate swelling around incision laterally on L hip, incision is c/d/i Functional mobility: transferring to WC with RW MinA Skin:    General: Skin is warm and dry.     Comments: Multiple healed skin tears on bilateral forearms.  Also has surgical dressing on L lateral hip- moderate bruising and edema- no erythema  Neurological:     Mental Status: He is alert and oriented to person, place, and time.     Comments: Mild left facial weakness with LUE weakness. LLE limited due to pain. He was aware of being at [facility] and situation but unable to recall day, month or year.  Decreased Orientation as above   Psychiatric:        Mood and Affect: normal affect    Behavior: Behavior normal. Fear of pain  Functional mobility: sit to stand with MinA, ambulating with RW CG Motivated for therapy  Assessment/Plan: 1. Functional deficits which require 3+ hours per day of interdisciplinary therapy in a comprehensive inpatient rehab setting. Physiatrist is providing close team supervision and 24 hour management of active medical problems listed below. Physiatrist and rehab team continue to assess barriers to discharge/monitor patient progress toward functional and medical goals  Care Tool:  Bathing    Body parts bathed by patient: Right arm, Left arm, Chest, Abdomen, Front perineal area, Right upper leg, Left upper leg, Right lower leg, Left lower leg, Face, Buttocks   Body parts bathed by helper: Buttocks     Bathing assist Assist Level: Supervision/Verbal cueing     Upper Body Dressing/Undressing Upper body dressing   What is the patient wearing?: Pull over shirt    Upper body assist Assist Level: Supervision/Verbal cueing    Lower Body Dressing/Undressing Lower body dressing      What is the patient wearing?: Pants     Lower body assist Assist for lower body dressing: Moderate Assistance  - Patient 50 - 74%     Toileting Toileting    Toileting assist Assist for toileting: Contact Guard/Touching assist     Transfers Chair/bed transfer  Transfers assist     Chair/bed transfer assist level: Contact Guard/Touching assist     Locomotion Ambulation   Ambulation assist   Ambulation activity did not occur: Safety/medical concerns  Assist level: Minimal Assistance - Patient > 75% Assistive device: Walker-rolling Max distance: 34ft   Walk 10 feet activity   Assist  Walk 10 feet activity did not occur: Safety/medical concerns  Assist level: Minimal Assistance - Patient > 75% Assistive device: Walker-rolling   Walk 50 feet activity   Assist Walk 50 feet with 2 turns activity did not occur: Safety/medical concerns  Assist level: Minimal Assistance - Patient > 75% Assistive device: Walker-rolling    Walk 150 feet activity   Assist Walk 150 feet activity did not occur: Safety/medical concerns         Walk 10 feet on uneven surface  activity   Assist Walk 10 feet on uneven surfaces activity did not occur: Safety/medical concerns         Wheelchair     Assist Is the patient using a wheelchair?: Yes Type of Wheelchair: Manual    Wheelchair assist level: Minimal Assistance - Patient > 75% Max wheelchair distance: 47ft    Wheelchair 50 feet with 2 turns activity    Assist        Assist Level: Moderate Assistance - Patient 50 - 74%   Wheelchair 150 feet activity     Assist      Assist Level: Total Assistance - Patient < 25%   Blood pressure 114/60, pulse (!) 53, temperature 98.3 F (36.8 C), temperature source Oral, resp. rate 16, height 5\' 7"  (1.702 m), weight 71.6 kg, SpO2 97 %.    Medical Problem List and Plan: 1. Functional deficits secondary to L hip fx in setting of previous L hemiparesis             -patient may  shower             -ELOS/Goals: 14-16 days mod I to supervision  -Continue CIR   -Ambulated 59  feet with PT using RW  2.  Impaired mobility and ADLs: continue Eliquis 2.5mg  BID because was refusing Lovenox shots. Discussed with patient.  3. Left hip pain: continue Tylenol and/or oxycodone  prn. Increase Gabapentin to 200mg  TID. Discussed scheduling Tylenol but he does not find tylenol helpful. Continue ice.  -6/9 pain improved today, continue to follow, he took PRN oxycodone 4. Insomnia: continue Trazodone 50mg  HS 5. Neuropsych: This patient is capable of making decisions on his own behalf. 6. Skin/Wound Care: Surgical dressing to stay in place for 2 weeks or till f/u with ortho             --routine pressure relief measures.  7. Hypoalbuminemia: d/c Juven given hyperglycemia. Encourage high protein foods.   8. Cirrhosis of the liver: Thrombocytopenia noted at admission 93-->72-->88. Monitor for signs of bleeding.              --discussed that Ammonia is stable. Continue lactulose             --continue Rifaximin  9. Hyponatremia: Multifactorial. Will recheck in am. 10. T2DM: Hgb A1c- 6.4 and diet controlled.  --Poorly controlled post op likely due to stress and immobility.              --decrease CBGS checks to [facility] and use SSI for elevated BS             --change diet to Carb Modified.   -add metformin 250mg  with lunch.   -6/9 CBG well controlled, follow trend 11. Constipation: Has not had BM since admission. Has been refusing laxatives per nursing.  12. H/o melanoma/Lung CA w/mets/COPD: Encourage tobacco cessation.             --respiratory status stable. Not on MDI at home.  13. H/o GIB: continue PPI bid.  14. Insomnia: On Trazodone and melatonin.  15. HTN: Monitor BP TID--continue Norvasc daily. Discontinue Flomax 0.4mg  HS -6/9BP well controlled, flomax restarted for urinary retention 16. Low grade fever: Encourage IS.  17. Urinary retention: Will monitor voiding with PVR checks. discontinue Flomax 0.4mg  HS             --toilet every 4 hours.   -6/9 elevated PVR, restart  flomax 18. L-MCA stroke: On ASA for stroke prophylaxis--resume.  19. Constipation: Resolved             --Continue Lactulose for OIC.  20. Sinusitis: recommended bee pollen and local honey outpatient  LOS: 10 days A FACE TO FACE EVALUATION WAS PERFORMED  Jennye Boroughs 06/11/2021, 8:52 AM

## 2021-06-11 NOTE — Progress Notes (Signed)
Occupational Therapy Session Note  Patient Details  Name: Dalton Miller MRN: [id number] Date of Birth: 08-Jun-1945  Today's Date: 06/11/2021 OT Individual Time: 2023-3435 OT Individual Time Calculation (min): 31 min    Short Term Goals: Week 1:  OT Short Term Goal 1 (Week 1): Pt will complete sit > stand in prep for ADL with min A using LRAD OT Short Term Goal 1 - Progress (Week 1): Met OT Short Term Goal 2 (Week 1): Pt will utilize LUE at diminished level for ADL tasks with supervision OT Short Term Goal 2 - Progress (Week 1): Met OT Short Term Goal 3 (Week 1): Pt will tolerate at least 2 mins of standing using LRAD and mod A for balance to promote endurance needed for toileting/LB self-care OT Short Term Goal 3 - Progress (Week 1): Met OT Short Term Goal 4 (Week 1): Pt will use AE PRN for threading of BLE into pants with no more than min A for seated balance OT Short Term Goal 4 - Progress (Week 1): Met Week 2:  OT Short Term Goal 1 (Week 2): STG = LTG 2/2 ELOS  Skilled Therapeutic Interventions/Progress Updates:    Pt received seated in recliner, reports 5/10 L hip pain with ice pack but, agreeable to therapy. Session focus on self-care retraining, activity tolerance, transfer retraining, in prep for improved ADL/IADL/func mobility performance + decreased caregiver burden.  Close S to CGA sit to stand and use of urinal with continent void with RW. Short ambulatory transfers throughout session with close S and RW, not fully WB on LLE due to pain.   Stood to play 10 min of bowling Wii game with cues for LLE WBAT and close S for balance throughout.  Returned to supine with close S and crossing BLE.  Pt left semi-reclined in bed with bed alarm engaged, call bell in reach, and all immediate needs met.    Therapy Documentation Precautions:  Precautions Precautions: Fall Restrictions Weight Bearing Restrictions: Yes LLE Weight Bearing: Weight bearing as tolerated  Pain:   See session  note ADL: See Care Tool for more details.  Therapy/Group: Individual Therapy  Volanda Napoleon MS, OTR/L  06/11/2021, 6:55 AM

## 2021-06-11 NOTE — Progress Notes (Signed)
Occupational Therapy Session Note  Patient Details  Name: Dalton Miller MRN: [id number] Date of Birth: 03/27/1945  Today's Date: 06/11/2021 OT Individual Time: 1100-1155 OT Individual Time Calculation (min): 55 min    Short Term Goals: Week 2:  OT Short Term Goal 1 (Week 2): STG = LTG 2/2 ELOS  Skilled Therapeutic Interventions/Progress Updates:  Skilled OT intervention completed with focus on functional endurance and transfers. Pt received seated in recliner, asleep, easily woken. Minimal, un-rated pain reported in L hip, with therapist providing rest breaks and repositioning for pain reduction throughout session. Completed sit > stand using RW with CGA then ambulatory transfers during session for about 30 total feet using RW with min A. Min A side step to shower chair with use of grab bars, with up to mod A needed for balance 2/2 pain and LLE weakness. Waterproof cover applied to L hip bandages per nursing since bandages had just been changed. Completed entire shower with set up A with use of LH sponge, including pericare at stance level. Therapist changing out sacral dressing. Donned shirt with set up A, mod A for LB clothing with pt with poor balance and being able release UE to manage pants while holding on to bar. Min-mod A side step to RW out of shower. Seated EOB, pt required max A to donn socks with attempt to utilize figure 4 position of RLE on left however too much pain. Pt unable to recall name of sock aid but able to teach back method of use of it, with review about how to purchase items provided. Educated pt on hooking method with RLE under LLE to promote ease with bed mobility and pt able to go EOB to supine with supervision with technique. Pt was left upright in bed, with bed alarm on and all needs in reach at end of session.   Therapy Documentation Precautions:  Precautions Precautions: Fall Restrictions Weight Bearing Restrictions: Yes LLE Weight Bearing: Weight bearing as  tolerated   Therapy/Group: Individual Therapy  Blase Mess, MS, OTR/L  06/11/2021, 7:54 AM

## 2021-06-11 NOTE — Progress Notes (Signed)
Physical Therapy Session Note  Patient Details  Name: Dalton Miller MRN: [id number] Date of Birth: April 18, 1945  Today's Date: 06/11/2021 PT Individual Time: 7564-3329 and 5188-4166 PT Individual Time Calculation (min): 70 min and 40 min  Short Term Goals: Week 1:  PT Short Term Goal 1 (Week 1): Pt will complete bed mobility with min A consistently PT Short Term Goal 1 - Progress (Week 1): Progressing toward goal PT Short Term Goal 2 (Week 1): Pt will complete least restrictive transfer with mod A consistently PT Short Term Goal 2 - Progress (Week 1): Met PT Short Term Goal 3 (Week 1): Pt will initiate gait training PT Short Term Goal 3 - Progress (Week 1): Met PT Short Term Goal 4 (Week 1): Pt will propel w/c x 50 ft with min A PT Short Term Goal 4 - Progress (Week 1): Met Week 2:  PT Short Term Goal 1 (Week 2): STG=LTG due to LOS  Skilled Therapeutic Interventions/Progress Updates:   Treatment Session 1  Received pt semi-reclined in bed, pt agreeable to PT treatment, and reported pain 5/10 in L hip (premedicated) - repositioning, rest breaks, and distraction done to reduce pain levels. Pt required increased time with all mobility this morning as pt hyperverbal telling therapist stories. Session with emphasis on functional mobility/transfers, generalized strengthening and endurance, dynamic standing balance/coordination, and gait training. Pt transferred semi-reclined<>sitting EOB with HOB elevated and use of bedrails with supervision and increased time/effort. Pt transferred bed<>WC stand<>pivot with RW and min A and performed WC mobility 136f using BUE and min/mod A to dayroom due to weakness in LUE from prior CVA. Took measurements to fit pt for 16x16 manual WC - notified CSW. Sit<>stand with RW and CGA and pt ambulated 457fwith RW and CGA/close supervision. Pt ambulates with decreased cadence, decreased weight shifting to L side, and decreased bilateral foot clearance L>R. Dicussed hospital  bed and pt reports ever since his stroke he has had difficulty getting in and out of bed and currently relies heavily on use of bedrails and adjustable HOB features. Dicussed recommendation of using RW vs rollator upon D/C but pt reports preferring to use rollator when outside - therefore retrieved rollator to demonstrate difficulty to pt. Sit<>stand with rollator and min A and pt ambulated 2424fith rollator and min A. Pt demonstrated more antalgic gait pattern with less control/stability and required min cues for rollator safety (including locking brakes prior to standing/sitting). Pt reported RW felt better than rollator and in agreement with therapist's recommendation for using RW upon D/C. [facility] and agreed to sit in recliner. Sit<>stand with RW and CGA and ambulated [id number] RW and CGA to recliner - stood to void in urinal with CGA (required min A for clothing management). Concluded session with pt sitting in recliner on Roho cushion, needs within reach, and seatbelt alarm on. RN present at bedside administering medication.   Treatment Session 2 Received pt semi-reclined in bed watching [employer]. Pt agreeable to PT treatment and reported pain 5/10 in L hip - repositioning, rest breaks, and distraction done to reduce pain levels. Session with emphasis on functional mobility/transfers, generalized strengthening and endurance, and gait training. Pt transferred semi-reclined<>sitting EOB with HOB elevated and use of bedrails and supervision. Sit<>stand with RW and min A and pt ambulated [id number] RW and CGA/min A when turning. Returned to recliner and performed the following exercises with supervision and verbal cues for technique with emphasis on LE  strength/ROM: -LAQ 212 bilaterally -hip flexion 2x12 bilaterally -hamstring curls with red TB 2x15 on RLE and 1x10 and 1x15 on LLE -hip abduction with red TB 2x12 Stood with RW and CGA/min A and placed Roho cushion on  recliner. Concluded session with pt sitting in recliner on Roho, needs within reach, and seatbelt alarm on. Provided pt with ice pack for L hip.    Therapy Documentation Precautions:  Precautions Precautions: Fall Restrictions Weight Bearing Restrictions: Yes LLE Weight Bearing: Weight bearing as tolerated  Therapy/Group: Individual Therapy Alfonse Alpers PT, DPT  06/11/2021, 7:00 AM

## 2021-06-12 DIAGNOSIS — K5901 Slow transit constipation: Secondary | ICD-10-CM

## 2021-06-12 DIAGNOSIS — G8194 Hemiplegia, unspecified affecting left nondominant side: Secondary | ICD-10-CM

## 2021-06-12 LAB — GLUCOSE, CAPILLARY: Glucose-Capillary: 132 mg/dL — ABNORMAL HIGH (ref 70–99)

## 2021-06-12 NOTE — Progress Notes (Signed)
PROGRESS NOTE   Subjective/Complaints: Pain improving. No c/o overnight.   ROS: Patient denies fever, rash, sore throat, blurred vision, dizziness, nausea, vomiting, diarrhea, cough, shortness of breath or chest pain,  back/neck pain, headache, or mood change.   Objective:   No results found. No results for input(s): "WBC", "HGB", "HCT", "PLT" in the last 72 hours.   No results for input(s): "NA", "K", "CL", "CO2", "GLUCOSE", "BUN", "CREATININE", "CALCIUM" in the last 72 hours.    Intake/Output Summary (Last 24 hours) at 06/12/2021 1140 Last data filed at 06/12/2021 1021 Gross per 24 hour  Intake 840 ml  Output 1750 ml  Net -910 ml        Physical Exam: Vital Signs Blood pressure 116/65, pulse 75, temperature 98.4 F (36.9 C), resp. rate 16, height 5\' 7"  (1.702 m), weight 71.6 kg, SpO2 98 %.  Vitals and nursing note reviewed.  Constitutional: No distress . Vital signs reviewed. HEENT: NCAT, EOMI, oral membranes moist Neck: supple Cardiovascular: RRR without murmur. No JVD    Respiratory/Chest: CTA Bilaterally without wheezes or rales. Normal effort    GI/Abdomen: BS +, non-tender, non-distended Ext: no clubbing, cyanosis, or edema Psych: pleasant and cooperative  Musculoskeletal:        General: Swelling present.     Cervical back: Neck supple. No tenderness.     Comments: RUE 5/5 LUE 4/5 due to previous stroke RLE 5/5 LLE- HF 2/5; 4/5 in KE/DF and PF   Skin:    General: Skin is warm and dry.     Comments: Multiple healed skin tears on bilateral forearms.    Left hip wound dressed  Neurological:     Mental Status: He is alert and oriented to person, place, and time.     Comments: Mild left facial weakness with LUE weakness ongoing. LLE limited due to pain. Fair insight and awareness      Assessment/Plan: 1. Functional deficits which require 3+ hours per day of interdisciplinary therapy in a  comprehensive inpatient rehab setting. Physiatrist is providing close team supervision and 24 hour management of active medical problems listed below. Physiatrist and rehab team continue to assess barriers to discharge/monitor patient progress toward functional and medical goals  Care Tool:  Bathing    Body parts bathed by patient: Right arm, Left arm, Chest, Abdomen, Front perineal area, Right upper leg, Left upper leg, Right lower leg, Left lower leg, Face, Buttocks   Body parts bathed by helper: Buttocks     Bathing assist Assist Level: Set up assist     Upper Body Dressing/Undressing Upper body dressing   What is the patient wearing?: Pull over shirt    Upper body assist Assist Level: Set up assist    Lower Body Dressing/Undressing Lower body dressing      What is the patient wearing?: Pants     Lower body assist Assist for lower body dressing: Minimal Assistance - Patient > 75%     Toileting Toileting    Toileting assist Assist for toileting: Contact Guard/Touching assist     Transfers Chair/bed transfer  Transfers assist     Chair/bed transfer assist level: Contact Guard/Touching assist  Locomotion Ambulation   Ambulation assist   Ambulation activity did not occur: Safety/medical concerns  Assist level: Contact Guard/Touching assist Assistive device: Walker-rolling Max distance: 44ft   Walk 10 feet activity   Assist  Walk 10 feet activity did not occur: Safety/medical concerns  Assist level: Contact Guard/Touching assist Assistive device: Walker-rolling   Walk 50 feet activity   Assist Walk 50 feet with 2 turns activity did not occur: Safety/medical concerns  Assist level: Minimal Assistance - Patient > 75% Assistive device: Walker-rolling    Walk 150 feet activity   Assist Walk 150 feet activity did not occur: Safety/medical concerns         Walk 10 feet on uneven surface  activity   Assist Walk 10 feet on uneven  surfaces activity did not occur: Safety/medical concerns         Wheelchair     Assist Is the patient using a wheelchair?: Yes Type of Wheelchair: Manual    Wheelchair assist level: Minimal Assistance - Patient > 75% Max wheelchair distance: 152ft    Wheelchair 50 feet with 2 turns activity    Assist        Assist Level: Minimal Assistance - Patient > 75%   Wheelchair 150 feet activity     Assist      Assist Level: Total Assistance - Patient < 25%   Blood pressure 116/65, pulse 75, temperature 98.4 F (36.9 C), resp. rate 16, height 5\' 7"  (1.702 m), weight 71.6 kg, SpO2 98 %.    Medical Problem List and Plan: 1. Functional deficits secondary to L hip fx in setting of previous L hemiparesis             -patient may  shower             -ELOS/Goals: 14-16 days mod I to supervision  -Continue CIR therapies including PT, OT  2.  Impaired mobility and ADLs: continue Eliquis 2.5mg  BID because was refusing Lovenox shots. Discussed with patient.  3. Left hip pain: continue Tylenol and/or oxycodone prn. Increase Gabapentin to 200mg  TID. Discussed scheduling Tylenol but he does not find tylenol helpful. Continue ice.  -6/10 pain improving. continue to follow, he took PRN oxycodone 4. Insomnia: continue Trazodone 50mg  HS 5. Neuropsych: This patient is capable of making decisions on his own behalf. 6. Skin/Wound Care: Surgical dressing to stay in place for 2 weeks or till f/u with ortho             --routine pressure relief measures.  7. Hypoalbuminemia: d/c Juven given hyperglycemia. Encourage high protein foods.   8. Cirrhosis of the liver: Thrombocytopenia noted at admission 93-->72-->88. Monitor for signs of bleeding.              --discussed that Ammonia is stable. Continue lactulose             --continue Rifaximin  9. Hyponatremia: Multifactorial. Will recheck in am. 10. T2DM: Hgb A1c- 6.4 and diet controlled.  --Poorly controlled post op likely due to stress  and immobility.              --decrease CBGS checks to [facility] and use SSI for elevated BS             --change diet to Carb Modified.   -add metformin 250mg  with lunch.   CBG (last 3)  Recent Labs    06/11/21 0532 06/11/21 1238 06/12/21 0627  GLUCAP 123* 126* 132*     -6/10 CBG well controlled  11. Constipation: Has not had BM since admission. Has been refusing laxatives per nursing.  12. H/o melanoma/Lung CA w/mets/COPD: Encourage tobacco cessation.             --respiratory status stable. Not on MDI at home.  13. H/o GIB: continue PPI bid.  14. Insomnia: On Trazodone and melatonin.  15. HTN: Monitor BP TID--continue Norvasc daily. Discontinue Flomax 0.4mg  HS -6/10 BP well controlled, flomax restarted for urinary retention 16. Low grade fever: Encourage IS.  17. Urinary retention: Will monitor voiding with PVR checks. discontinue Flomax 0.4mg  HS             --toilet every 4 hours.   -6/10 flomax restarted. Pvr's 250cc or less 18. L-MCA stroke: On ASA for stroke prophylaxis--resume.  19. Constipation: Resolved             --Continue Lactulose for OIC.  20. Sinusitis: recommended bee pollen and local honey outpatient  LOS: 11 days A FACE TO FACE EVALUATION WAS PERFORMED  Meredith Staggers 06/12/2021, 11:40 AM

## 2021-06-12 NOTE — Progress Notes (Signed)
Physical Therapy Session Note  Patient Details  Name: Dalton Miller MRN: [id number] Date of Birth: 06/28/1945  Today's Date: 06/12/2021 PT Individual Time: 0830-0925 PT Individual Time Calculation (min): 55 min   Short Term Goals: Week 2:  PT Short Term Goal 1 (Week 2): STG=LTG due to LOS  Skilled Therapeutic Interventions/Progress Updates:     Pt supine in bed resting, awakens easily to voice. Pt agreeable to PT tx. Reports 0/10 resting pain and 5/10 L hip pain during mobility. Rest breaks and repositioning provided for pain management. Pt reporting some anxiety regarding upcoming DC, unsure of his readiness - discussed general DC planning and his overall progress with therapy which improved spirits. Supine<>sitting EOB with minA for RLE and trunk support with HOB raised. Able to sit unsupported at EOB with supervision. Pt requesting to urinate prior to leaving his room. Used urinal for urgency. Sit<>stand to RW with minA and pt able to manage urinal in standing with CGA for balance. Pt continent of bladder. (Charted). Stand<>pivot transfer to w/c using RW with minA and then transported to main rehab gym for time management. TUG using RW and close supervision in [id number] - scores >13.5 seconds indicate increased falls risk. Gait training with RW and close supervision [id number] rehab hallways - antalgic gait with decreased heel strike on L, decreased weight shift L, decreased foot clearance L - cues throughout for correcting with limited improvement in deficits. Stand step transfer to Nustep with RW and supervision. Required setupA for LLE management. Completed for 10 minutes at level 2, focusing on AAROM and strengthening for LLE. Played music of his choice during activity to distract him from LLE pain.  Returned to room in w/c for energy conservation. Pt reporting 7/10 hip pain at this point of session. He received pain medication prior to arrival so repositioned for comfort. Ambulatory transfer to  bed with close supervision and RW. Required minA for bed mobility for LLE management. Bed placed in chair position with alarm on. SetupA for his breakfast. All needs met.   Therapy Documentation Precautions:  Precautions Precautions: Fall Restrictions Weight Bearing Restrictions: Yes LLE Weight Bearing: Weight bearing as tolerated General:    Therapy/Group: Individual Therapy  ChAlger Simons/10/2021, 7:16 AM

## 2021-06-13 LAB — GLUCOSE, CAPILLARY: Glucose-Capillary: 133 mg/dL — ABNORMAL HIGH (ref 70–99)

## 2021-06-13 NOTE — Progress Notes (Signed)
PROGRESS NOTE   Subjective/Complaints: Pt doing fairly well. Left hip sore but tolerable.   ROS: Patient denies fever, rash, sore throat, blurred vision, dizziness, nausea, vomiting, diarrhea, cough, shortness of breath or chest pain, joint or back/neck pain, headache, or mood change.   Objective:   No results found. No results for input(s): "WBC", "HGB", "HCT", "PLT" in the last 72 hours.   No results for input(s): "NA", "K", "CL", "CO2", "GLUCOSE", "BUN", "CREATININE", "CALCIUM" in the last 72 hours.    Intake/Output Summary (Last 24 hours) at 06/13/2021 1107 Last data filed at 06/13/2021 1106 Gross per 24 hour  Intake 960 ml  Output 1425 ml  Net -465 ml        Physical Exam: Vital Signs Blood pressure (!) 114/56, pulse 67, temperature 98.2 F (36.8 C), resp. rate 18, height 5\' 7"  (1.702 m), weight 71.6 kg, SpO2 100 %.  Constitutional: No distress . Vital signs reviewed. HEENT: NCAT, EOMI, oral membranes moist Neck: supple Cardiovascular: RRR without murmur. No JVD    Respiratory/Chest: CTA Bilaterally without wheezes or rales. Normal effort    GI/Abdomen: BS +, non-tender, non-distended Ext: no clubbing, cyanosis, or edema Psych: pleasant and cooperative  Musculoskeletal:        General: Swelling present left hip.     Cervical back: Neck supple. No tenderness.     Comments: RUE 5/5 LUE 4/5 due to previous stroke RLE 5/5 LLE- HF 2/5; 4/5 in KE/DF and PF   Skin:    General: Skin is warm and dry.     Comments: Multiple healed skin tears on bilateral forearms improving.    Left hip wound dressed  Neurological:     Mental Status: He is alert and oriented to person, place, and time.     Comments: Mild left facial weakness with LUE weakness ongoing. LLE limited due to pain. Fair insight and awareness      Assessment/Plan: 1. Functional deficits which require 3+ hours per day of interdisciplinary therapy in  a comprehensive inpatient rehab setting. Physiatrist is providing close team supervision and 24 hour management of active medical problems listed below. Physiatrist and rehab team continue to assess barriers to discharge/monitor patient progress toward functional and medical goals  Care Tool:  Bathing    Body parts bathed by patient: Right arm, Left arm, Chest, Abdomen, Front perineal area, Right upper leg, Left upper leg, Right lower leg, Left lower leg, Face, Buttocks   Body parts bathed by helper: Buttocks     Bathing assist Assist Level: Set up assist     Upper Body Dressing/Undressing Upper body dressing   What is the patient wearing?: Pull over shirt    Upper body assist Assist Level: Set up assist    Lower Body Dressing/Undressing Lower body dressing      What is the patient wearing?: Pants     Lower body assist Assist for lower body dressing: Minimal Assistance - Patient > 75%     Toileting Toileting    Toileting assist Assist for toileting: Contact Guard/Touching assist     Transfers Chair/bed transfer  Transfers assist     Chair/bed transfer assist level: Contact Guard/Touching assist  Locomotion Ambulation   Ambulation assist   Ambulation activity did not occur: Safety/medical concerns  Assist level: Contact Guard/Touching assist Assistive device: Walker-rolling Max distance: 69ft   Walk 10 feet activity   Assist  Walk 10 feet activity did not occur: Safety/medical concerns  Assist level: Contact Guard/Touching assist Assistive device: Walker-rolling   Walk 50 feet activity   Assist Walk 50 feet with 2 turns activity did not occur: Safety/medical concerns  Assist level: Minimal Assistance - Patient > 75% Assistive device: Walker-rolling    Walk 150 feet activity   Assist Walk 150 feet activity did not occur: Safety/medical concerns         Walk 10 feet on uneven surface  activity   Assist Walk 10 feet on uneven  surfaces activity did not occur: Safety/medical concerns         Wheelchair     Assist Is the patient using a wheelchair?: Yes Type of Wheelchair: Manual    Wheelchair assist level: Minimal Assistance - Patient > 75% Max wheelchair distance: 1101ft    Wheelchair 50 feet with 2 turns activity    Assist        Assist Level: Minimal Assistance - Patient > 75%   Wheelchair 150 feet activity     Assist      Assist Level: Total Assistance - Patient < 25%   Blood pressure (!) 114/56, pulse 67, temperature 98.2 F (36.8 C), resp. rate 18, height 5\' 7"  (1.702 m), weight 71.6 kg, SpO2 100 %.    Medical Problem List and Plan: 1. Functional deficits secondary to L hip fx in setting of previous L hemiparesis             -patient may  shower             -ELOS/Goals: 14-16 days mod I to supervision  -Continue CIR therapies including PT, OT  2.  Impaired mobility and ADLs: continue Eliquis 2.5mg  BID because was refusing Lovenox shots. Discussed with patient.  3. Left hip pain: continue Tylenol and/or oxycodone prn. Increase Gabapentin to 200mg  TID. Discussed scheduling Tylenol but he does not find tylenol helpful. Continue ice.  -6/11 pain improving. continue to follow, he took PRN oxycodone 4. Insomnia: continue Trazodone 50mg  HS 5. Neuropsych: This patient is capable of making decisions on his own behalf. 6. Skin/Wound Care: Surgical dressing to stay in place for 2 weeks or till f/u with ortho             --routine pressure relief measures.  7. Hypoalbuminemia: d/c Juven given hyperglycemia. Encourage high protein foods.   8. Cirrhosis of the liver: Thrombocytopenia noted at admission 93-->72-->88. Monitor for signs of bleeding.              --discussed that Ammonia is stable. Continue lactulose             --continue Rifaximin  9. Hyponatremia: Multifactorial. Will recheck in am. 10. T2DM: Hgb A1c- 6.4 and diet controlled.  --Poorly controlled post op likely due to  stress and immobility.              --decrease CBGS checks to [facility] and use SSI for elevated BS             --change diet to Carb Modified.   -add metformin 250mg  with lunch.   CBG (last 3)  Recent Labs    06/11/21 1238 06/12/21 0627 06/13/21 0546  GLUCAP 126* 132* 133*     -6/11 CBG well  controlled  11. Constipation: Has not had BM since admission. Has been refusing laxatives per nursing.  12. H/o melanoma/Lung CA w/mets/COPD: Encourage tobacco cessation.             --respiratory status stable. Not on MDI at home.  13. H/o GIB: continue PPI bid.  14. Insomnia: On Trazodone and melatonin.  15. HTN: Monitor BP TID--continue Norvasc daily. Discontinue Flomax 0.4mg  HS -6/11 BP well controlled, flomax restarted for urinary retention 16. Low grade fever: Encourage IS.  17. Urinary retention: Will monitor voiding with PVR checks. discontinue Flomax 0.4mg  HS             --toilet every 4 hours.   -6/10 flomax restarted. Pvr's <100cc, he's continent 18. L-MCA stroke: On ASA for stroke prophylaxis--resume.  19. Constipation: Resolved             --Continue Lactulose for OIC.  20. Sinusitis: recommended bee pollen and local honey outpatient  LOS: 12 days A FACE TO [place] 06/13/2021, 11:07 AM

## 2021-06-14 DIAGNOSIS — E871 Hypo-osmolality and hyponatremia: Secondary | ICD-10-CM

## 2021-06-14 LAB — CBC
HCT: 24.1 % — ABNORMAL LOW (ref 39.0–52.0)
Hemoglobin: 7.6 g/dL — ABNORMAL LOW (ref 13.0–17.0)
MCH: 28.6 pg (ref 26.0–34.0)
MCHC: 31.5 g/dL (ref 30.0–36.0)
MCV: 90.6 fL (ref 80.0–100.0)
Platelets: 152 K/uL (ref 150–400)
RBC: 2.66 MIL/uL — ABNORMAL LOW (ref 4.22–5.81)
RDW: 15.9 % — ABNORMAL HIGH (ref 11.5–15.5)
WBC: 4.4 K/uL (ref 4.0–10.5)
nRBC: 0 % (ref 0.0–0.2)

## 2021-06-14 LAB — GLUCOSE, CAPILLARY: Glucose-Capillary: 130 mg/dL — ABNORMAL HIGH (ref 70–99)

## 2021-06-14 MED ORDER — OXYCODONE HCL 5 MG PO TABS
10.0000 mg | ORAL_TABLET | Freq: Once | ORAL | Status: AC
  Administered 2021-06-14: 10 mg via ORAL
  Filled 2021-06-14: qty 2

## 2021-06-14 MED ORDER — OXYCODONE HCL 5 MG PO TABS
10.0000 mg | ORAL_TABLET | Freq: Two times a day (BID) | ORAL | Status: DC
  Administered 2021-06-15 – 2021-06-18 (×7): 10 mg via ORAL
  Filled 2021-06-14 (×7): qty 2

## 2021-06-14 NOTE — Progress Notes (Signed)
Physical Therapy Session Note  Patient Details  Name: Dalton Miller MRN: [id number] Date of Birth: [birth year]  Today's Date: 06/14/2021 PT Individual Time: 4332-9518 and 8416-6063 PT Individual Time Calculation (min): 70 min and 20 min PT Missed Time: 40 minutes PT Missed Time Reason: Pain  Short Term Goals: Week 1:  PT Short Term Goal 1 (Week 1): Pt will complete bed mobility with min A consistently PT Short Term Goal 1 - Progress (Week 1): Progressing toward goal PT Short Term Goal 2 (Week 1): Pt will complete least restrictive transfer with mod A consistently PT Short Term Goal 2 - Progress (Week 1): Met PT Short Term Goal 3 (Week 1): Pt will initiate gait training PT Short Term Goal 3 - Progress (Week 1): Met PT Short Term Goal 4 (Week 1): Pt will propel w/c x 50 ft with min A PT Short Term Goal 4 - Progress (Week 1): Met Week 2:  PT Short Term Goal 1 (Week 2): STG=LTG due to LOS  Skilled Therapeutic Interventions/Progress Updates:   Treatment Session 1 Received pt semi-reclined in bed, pt agreeable to PT treatment, and denied any pain at rest, but increasing in L hip with mobility (unrated but premedicated). Session with emphasis on functional mobility/transfers, toileting, generalized strengthening and endurance, dynamic standing balance/coordination, and gait training. Pt transferred semi-reclined<>sitting EOB with HOB elevated and use of bedrails with supervision using RLE to hook LLE. Pt stood from EOB with RW and CGA and voided in urinal with CGA. Pt then transferred bed<>WC stand<>pivot with RW and CGA/close supervision, then performed WC mobility 157f using BUE and min A to dayroom - tendency to veer towards L due to weakness from prior CVA. Sit<>stand with RW and light min A and pt ambulated 1858fwith RW and supervision - 1 instance of heavy CGA due to L lateral LOB but pt able to shift weight to LLE and correct himself. Pt reported having a RW at home but stated that his son  added 2 wheels to the front (to make 4 wheels) - encouraged pt to have son remove additional wheels for improved stability/safety due to pt's reliance on UE use and pt in agreement. Pt performed the following exercises with supervision and verbal cues for technique with emphasis on LE strength/ROM: -seated LLE heel slides on towel 2x20 -standing LLE toe taps to 3in step 2x15 -standing heel raises 2x10 -standing hip abduction 2x10 bilaterally Pt transferred mat<>WC stand<>pivot with RW and CGA. Returned to room and stood with RW and CGA and ambulated 1231fith RW and supervision to recliner. Concluded session with pt sitting in recliner on Roho cushion, needs within reach, and seatbelt alarm on. Provided pt with fresh coffee and snack.   Treatment Session 2 Received pt supine in bed asleep. Upon wakening, pt reported extreme pain in L hip and stated he was unable to tolerate any standing during his last therapy session. RN notified but reported pt up to date on all pain medications other than Tylenol (which pt declined) - provided pt with ice pack for L hip. Pt politely declined OOB mobility or bed level exercises but was agreeable to therapist adding onto current HEP. Provided pt with additional HEP and educated on frequency/duration/technique for the following exercises: - Standing Heel Raises  - 1 x daily - 7 x weekly - 2 sets - 10 reps - Standing Hip Abduction with Counter Support  - 1 x daily - 7 x weekly - 2 sets - 10 reps -  Standing March with Counter Support  - 1 x daily - 7 x weekly - 2 sets - 10 reps - Standing Hip Extension with Counter Support  - 1 x daily - 7 x weekly - 2 sets - 10 reps Showed pt health resource notebook - pt unaware that he had one but appreciative of a place to keep all important paperwork in and began looking through binder - placed exercises in binder. Concluded session with pt supine in bed, needs within reach, and bed alarm on. 40 minutes missed of skilled physical  therapy due to pain.   Therapy Documentation Precautions:  Precautions Precautions: Fall Restrictions Weight Bearing Restrictions: Yes LLE Weight Bearing: Weight bearing as tolerated  Therapy/Group: Individual Therapy Alfonse Alpers PT, DPT  06/14/2021, 6:58 AM

## 2021-06-14 NOTE — Progress Notes (Signed)
Occupational Therapy Session Note  Patient Details  Name: JAMAN ARO MRN: [id number] Date of Birth: 02-Mar-1945  Today's Date: 06/14/2021 OT Individual Time: 1300-1330 OT Individual Time Calculation (min): 30 min    Short Term Goals: Week 2:  OT Short Term Goal 1 (Week 2): STG = LTG 2/2 ELOS   Therapy Documentation Precautions:  Precautions Precautions: Fall Restrictions Weight Bearing Restrictions: Yes LLE Weight Bearing: Weight bearing as tolerated General: Upon OT arrival, pt seated in recliner and reports 7/10 pain to the L LE. Pt premedicated before session. Pt agreeable to OT treatment session. Pt reports being too tired to walk but is agreeable to seated tasks. Pt sits edge of chair to engage in LB dressing with AE to improve independence during self care. Pt uses reacher and sock aid to doff/donn socks and foot funnel with supervision for effective use of equipment with verbal cues. Pt demonstrates good carryover. Pt was educated on where to purchase hip kit and foot funnel. Increased time required secondary to decreased coordination in the L UE. Pt making progress towards stated OT goals and continues to benefit from OT services to achieve highest level of independence. Pt left in chair with all needs met.   ADL: Lower Body Dressing: Supervision/safety Where Assessed-Lower Body Dressing: Chair  Therapy/Group: Individual Therapy  Marvetta Gibbons 06/14/2021, 1:19 PM

## 2021-06-14 NOTE — Progress Notes (Signed)
Occupational Therapy Session Note  Patient Details  Name: Dalton Miller MRN: [id number] Date of Birth: [birth year]  Today's Date: 06/14/2021 OT Individual Time: 1103-1200 OT Individual Time Calculation (min): 57 min    Short Term Goals: Week 2:  OT Short Term Goal 1 (Week 2): STG = LTG 2/2 ELOS  Skilled Therapeutic Interventions/Progress Updates:  Skilled OT intervention completed with focus on LUE proximal shoulder AROM and fine motor control. Pt received seated in recliner, 5/10 pain in L hip, pre-medicated, with therapist offering rest breaks and repositioning for pain reduction. Sit > stand using RW with supervision then ambulatory transfer with supervision 155 ft to gym. Seated at high-low table, pt completed the following to promote AROM and fine motor control of the LUE needed for ADL tasks:  - Placing/retrieving bean bags from L side to wedge in front of him and back to bin. Improved shoulder flexion AROM this session, as well as with gross grasp in L hand. -Picking up 18 poker chips with L hand and dropping into cup, with pt demonstrating difficulty with fine motor strength and dexterity needed for picking up from flat table. Education provided on modification technique of sliding chip to edge of table then using thumb to pick up. Good control from edge of table to reaching to cup demonstrated. - Upgraded task to using resistive slot on cup for pushing chip through with increased challenge demonstrated. Pt requiring up to min A at time to push chip through.   Education provided about ways to practice fine motor control/coordination at home I.e. reaching for wash clothes and folding with BUE. Transported pt dependently in w/c 2/2 fatigue back to room, then min A needed for sit > stand using RW and CGA ambulatory transfer to recliner. Pt was left upright in recliner, seated on w/c cushion for pressure relief, belt alarm on and all needs in reach at end of session.   Therapy  Documentation Precautions:  Precautions Precautions: Fall Restrictions Weight Bearing Restrictions: Yes LLE Weight Bearing: Weight bearing as tolerated    Therapy/Group: Individual Therapy  Blase Mess, MS, OTR/L  06/14/2021, 7:41 AM

## 2021-06-14 NOTE — Discharge Instructions (Addendum)
Inpatient Rehab Discharge Instructions  [place] Discharge date and time:  06/18/21  Activities/Precautions/ Functional Status: Activity: no lifting, driving, or strenuous exercise till cleared by MD Diet: cardiac diet Wound Care: keep wound clean and dry. Contact Dr. Doreatha Martin if you develop any problems with your incision/wound--redness, swelling, increase in pain, drainage or if you develop fever or chills.     Functional status:  ___ No restrictions     ___ Walk up steps independently __X_ 24/7 supervision/assistance   ___ Walk up steps with assistance ___ Intermittent supervision/assistance  ___ Bathe/dress independently ___ Walk with walker     ___ Bathe/dress with assistance ___ Walk Independently    ___ Shower independently ___ Walk with assistance    _X__ Shower with assistance _X__ No alcohol     ___ Return to work/school ________  COMMUNITY REFERRALS UPON DISCHARGE:    Home Health:   PT     OT                    Agency: [facility] Phone: [phone]   Medical Equipment/Items Ordered: Hospital bed, Wheelchair, Bedside Commode                                                 Agency/Supplier: [place]    Special Instructions:    My questions have been answered and I understand these instructions. I will adhere to these goals and the provided educational materials after my discharge from the hospital.  Patient/Caregiver Signature _______________________________ Date __________  Clinician Signature _______________________________________ Date __________  Please bring this form and your medication list with you to all your follow-up doctor's appointments.

## 2021-06-14 NOTE — Progress Notes (Signed)
PROGRESS NOTE   Subjective/Complaints: Pt very happy with all his PT/OT therapist and nursing. No concerns this AM.  ROS: No fever,chills, SOB, HA, CP.  Objective:   No results found. Recent Labs    06/14/21 0645  WBC 4.4  HGB 7.6*  HCT 24.1*  PLT 152     No results for input(s): "NA", "K", "CL", "CO2", "GLUCOSE", "BUN", "CREATININE", "CALCIUM" in the last 72 hours.    Intake/Output Summary (Last 24 hours) at 06/14/2021 0752 Last data filed at 06/14/2021 0356 Gross per 24 hour  Intake 840 ml  Output 1300 ml  Net -460 ml         Physical Exam: Vital Signs Blood pressure 115/65, pulse 65, temperature 97.9 F (36.6 C), resp. rate 18, height 5\' 7"  (1.702 m), weight 71.6 kg, SpO2 96 %.  Constitutional: No distress . Vital signs reviewed. Sitting in bed HEENT: NCAT, EOMI, oral membranes moist Neck: supple Cardiovascular: RRR without murmur. No JVD    Respiratory/Chest: CTA Bilaterally without wheezes or rales. Normal effort    GI/Abdomen: BS +, soft,  non-tender, non-distended Ext: no clubbing, cyanosis, or edema Psych: very pleasant Musculoskeletal:        General: Swelling present left hip.     Cervical back: Neck supple. No tenderness.     Comments: RUE 5/5 LUE 4/5 due to previous stroke RLE 5/5 LLE- HF 2/5; 4/5 in KE/DF and PF   Skin:    General: Skin is warm and dry.     Comments: Multiple healed skin tears on bilateral forearms improving.    Left hip wound dressed  Neurological:     Mental Status: He is alert and oriented to person, place, and time.     Comments: Mild left facial weakness with LUE weakness ongoing. LLE limited due to pain. Fair insight and awareness      Assessment/Plan: 1. Functional deficits which require 3+ hours per day of interdisciplinary therapy in a comprehensive inpatient rehab setting. Physiatrist is providing close team supervision and 24 hour management of active  medical problems listed below. Physiatrist and rehab team continue to assess barriers to discharge/monitor patient progress toward functional and medical goals  Care Tool:  Bathing    Body parts bathed by patient: Right arm, Left arm, Chest, Abdomen, Front perineal area, Right upper leg, Left upper leg, Right lower leg, Left lower leg, Face, Buttocks   Body parts bathed by helper: Buttocks     Bathing assist Assist Level: Set up assist     Upper Body Dressing/Undressing Upper body dressing   What is the patient wearing?: Pull over shirt    Upper body assist Assist Level: Set up assist    Lower Body Dressing/Undressing Lower body dressing      What is the patient wearing?: Pants     Lower body assist Assist for lower body dressing: Minimal Assistance - Patient > 75%     Toileting Toileting    Toileting assist Assist for toileting: Contact Guard/Touching assist     Transfers Chair/bed transfer  Transfers assist     Chair/bed transfer assist level: Contact Guard/Touching assist     Locomotion Ambulation   Ambulation  assist   Ambulation activity did not occur: Safety/medical concerns  Assist level: Contact Guard/Touching assist Assistive device: Walker-rolling Max distance: 63ft   Walk 10 feet activity   Assist  Walk 10 feet activity did not occur: Safety/medical concerns  Assist level: Contact Guard/Touching assist Assistive device: Walker-rolling   Walk 50 feet activity   Assist Walk 50 feet with 2 turns activity did not occur: Safety/medical concerns  Assist level: Minimal Assistance - Patient > 75% Assistive device: Walker-rolling    Walk 150 feet activity   Assist Walk 150 feet activity did not occur: Safety/medical concerns         Walk 10 feet on uneven surface  activity   Assist Walk 10 feet on uneven surfaces activity did not occur: Safety/medical concerns         Wheelchair     Assist Is the patient using a  wheelchair?: Yes Type of Wheelchair: Manual    Wheelchair assist level: Minimal Assistance - Patient > 75% Max wheelchair distance: 174ft    Wheelchair 50 feet with 2 turns activity    Assist        Assist Level: Minimal Assistance - Patient > 75%   Wheelchair 150 feet activity     Assist      Assist Level: Total Assistance - Patient < 25%   Blood pressure 115/65, pulse 65, temperature 97.9 F (36.6 C), resp. rate 18, height 5\' 7"  (1.702 m), weight 71.6 kg, SpO2 96 %.    Medical Problem List and Plan: 1. Functional deficits secondary to L hip fx in setting of previous L hemiparesis             -patient may  shower             -ELOS/Goals: 14-16 days mod I to supervision  -Continue CIR therapies including PT, OT   -Oxycodone helping to keep pain under control during therapy, continue 2.  Impaired mobility and ADLs: continue Eliquis 2.5mg  BID because was refusing Lovenox shots. Discussed with patient.  3. Left hip pain: continue Tylenol and/or oxycodone prn. Increase Gabapentin to 200mg  TID. Discussed scheduling Tylenol but he does not find tylenol helpful. Continue ice.  -Continue PRN oxycodone, discussed using before therapy if needed 4. Insomnia: continue Trazodone 50mg  HS 5. Neuropsych: This patient is capable of making decisions on his own behalf. 6. Skin/Wound Care: Surgical dressing to stay in place for 2 weeks or till f/u with ortho             --routine pressure relief measures.  7. Hypoalbuminemia: d/c Juven given hyperglycemia. Encourage high protein foods.   8. Cirrhosis of the liver: Thrombocytopenia noted at admission 93-->72-->88. Monitor for signs of bleeding.              --discussed that Ammonia is stable. Continue lactulose             --continue Rifaximin  9. Hyponatremia: Multifactorial. Will recheck in am. -Stable at 135 on 06/08/21, follow 10. T2DM: Hgb A1c- 6.4 and diet controlled.  --Poorly controlled post op likely due to stress and  immobility.              --decrease CBGS checks to [facility] and use SSI for elevated BS             --change diet to Carb Modified.   -add metformin 250mg  with lunch.   CBG (last 3)  Recent Labs    06/12/21 0627 06/13/21 0546 06/14/21 0620  GLUCAP  132* 133* 130*     -6/12 Glucose well controlled, monitor 11. Constipation: Has not had BM since admission. Has been refusing laxatives per nursing.  12. H/o melanoma/Lung CA w/mets/COPD: Encourage tobacco cessation.             --respiratory status stable. Not on MDI at home.  13. H/o GIB: continue PPI bid.  14. Insomnia: On Trazodone and melatonin.  15. HTN: Monitor BP TID--continue Norvasc daily. Discontinue Flomax 0.4mg  HS -6/11 BP well controlled, flomax restarted for urinary retention -6/12 BP well controlled, continue to monitor 16. Low grade fever: Encourage IS.  17. Urinary retention: Will monitor voiding with PVR checks. discontinue Flomax 0.4mg  HS             --toilet every 4 hours.   -6/10 flomax restarted. Pvr's <100cc, he's continent 18. L-MCA stroke: On ASA for stroke prophylaxis--resume.  19. Constipation: Resolved             --Continue Lactulose for OIC.  20. Sinusitis: recommended bee pollen and local honey outpatient  LOS: 13 days A FACE TO FACE EVALUATION WAS PERFORMED  Jennye Boroughs 06/14/2021, 7:52 AM

## 2021-06-15 LAB — BASIC METABOLIC PANEL
Anion gap: 5 (ref 5–15)
BUN: 24 mg/dL — ABNORMAL HIGH (ref 8–23)
CO2: 24 mmol/L (ref 22–32)
Calcium: 8.9 mg/dL (ref 8.9–10.3)
Chloride: 105 mmol/L (ref 98–111)
Creatinine, Ser: 1.17 mg/dL (ref 0.61–1.24)
GFR, Estimated: >60 mL/min (ref >60)
Glucose, Bld: 150 mg/dL — ABNORMAL HIGH (ref 70–99)
Potassium: 4.6 mmol/L (ref 3.5–5.1)
Sodium: 134 mmol/L — ABNORMAL LOW (ref 135–145)

## 2021-06-15 LAB — GLUCOSE, CAPILLARY: Glucose-Capillary: 151 mg/dL — ABNORMAL HIGH (ref 70–99)

## 2021-06-15 NOTE — Progress Notes (Addendum)
Occupational Therapy Discharge Summary  Patient Details  Name: Dalton Miller MRN: [id number] Date of Birth: October 14, 1945  Today's Date: 06/17/2021 OT Individual Time: 1000-1030 OT Individual Time Calculation (min): 30 min     Patient has met 10 of 10 long term goals due to improved activity tolerance, improved balance, ability to compensate for deficits, functional use of  LEFT upper and LEFT lower extremity, improved awareness, and improved coordination.  Patient to discharge at overall Supervision level. Patient has been provided with BUE HEP for stretching and strengthening and pt reports he has no questions regarding this.  Patient's care partner his son and daughter in law are independent to provide the necessary physical assistance at discharge.  Skilled Intervention: Pt sitting in recliner asleep but easily awakened, stating he was not able to sleep much last night due to staff related noises in hallway.  Educated pt on sleeping strategy use of white noise app on phone to assist in sleep quality.  Pt requesting to assist in downloading free app on phone and required max assist to complete.  Assessed BUE strength and ROM, BIMs, and reviewed dc plans including following up on DME delivery with [facility].  Call bell in reach, seat alarm on.  Reasons goals not met: N/A  Recommendation:  Patient will benefit from ongoing skilled OT services in home health setting to continue to advance functional skills in the area of BADL and iADL.  Equipment: 3 in 1 BSC, reports already having TTB  Reasons for discharge: treatment goals met  Patient/family agrees with progress made and goals achieved: Yes  OT Discharge Precautions/Restrictions  Precautions Precautions: Fall Restrictions Weight Bearing Restrictions: Yes LLE Weight Bearing: Weight bearing as tolerated ADL ADL Equipment Provided: Long-handled sponge Eating: Modified independent Where Assessed-Eating: Wheelchair Grooming: Modified  independent Where Assessed-Grooming: Edge of bed Upper Body Bathing: Modified independent Where Assessed-Upper Body Bathing: Shower Lower Body Bathing: Supervision/safety Where Assessed-Lower Body Bathing: Shower Upper Body Dressing: Modified independent (Device) Where Assessed-Upper Body Dressing: Edge of bed Lower Body Dressing: Supervision/safety Where Assessed-Lower Body Dressing: Edge of bed Toileting: Supervision/safety Where Assessed-Toileting: Other (Comment) (urinal in stance) Toilet Transfer: Close supervision Toilet Transfer Method: Ambulating Tub/Shower Transfer: Minimal assistance Tub/Shower Transfer Method: Sit pivot Tub/Shower Equipment: Radio broadcast assistant, Energy manager: Curator Method: Ambulating, Sit pivot Advertising account executive with back, Grab bars Vision Baseline Vision/History: 1 Wears glasses Patient Visual Report: No change from baseline Perception  Perception: Within Functional Limits Praxis Praxis: Intact Cognition Cognition Overall Cognitive Status: Within Functional Limits for tasks assessed Arousal/Alertness: Awake/alert Orientation Level: Person;Place;Situation Person: Oriented Place: Oriented Situation: Oriented Memory: Appears intact Awareness: Appears intact Problem Solving: Appears intact Safety/Judgment: Appears intact Brief Interview for Mental Status (BIMS) Repetition of Three Words (First Attempt): 3 Temporal Orientation: Year: Correct Temporal Orientation: Month: Accurate within 5 days Temporal Orientation: Day: Correct Recall: "Sock": Yes, no cue required Recall: "Blue": Yes, no cue required Recall: "Bed": Yes, no cue required BIMS Summary Score: 15 Sensation Sensation Light Touch: Appears Intact Hot/Cold: Appears Intact Proprioception: Appears Intact Stereognosis: Not tested Coordination Gross Motor Movements are Fluid and Coordinated: No Fine Motor  Movements are Fluid and Coordinated: No Coordination and Movement Description: impaired 2/2 pain and residual L hemi Finger Nose Finger Test: Overshooting on RUE, unable to complete on LUE from CVA in December 2022 Motor  Motor Motor: Hemiplegia Motor - Skilled Clinical Observations: mild L hemi from previous CVA and generalized weakness/deconditioning Mobility  Bed  Mobility Bed Mobility: Rolling Right;Rolling Left;Sit to Supine;Supine to Sit Rolling Right: Supervision/verbal cueing Rolling Left: Supervision/Verbal cueing Supine to Sit: Supervision/Verbal cueing Sit to Supine: Supervision/Verbal cueing Transfers Sit to Stand: Supervision/Verbal cueing Stand to Sit: Supervision/Verbal cueing  Trunk/Postural Assessment  Cervical Assessment Cervical Assessment: Exceptions to [facility] (forward head) Thoracic Assessment Thoracic Assessment: Within Functional Limits Lumbar Assessment Lumbar Assessment: Exceptions to [facility] (posterior pelvic tilt) Postural Control Postural Control: Within Functional Limits  Balance Balance Balance Assessed: Yes Static Sitting Balance Static Sitting - Balance Support: No upper extremity supported;Feet supported Static Sitting - Level of Assistance: 6: Modified independent (Device/Increase time) Dynamic Sitting Balance Dynamic Sitting - Balance Support: No upper extremity supported;Feet supported Dynamic Sitting - Level of Assistance: 5: Stand by assistance (distant supervision) Static Standing Balance Static Standing - Balance Support: Bilateral upper extremity supported;During functional activity (RW) Static Standing - Level of Assistance: 5: Stand by assistance (supervision) Dynamic Standing Balance Dynamic Standing - Balance Support: Bilateral upper extremity supported;During functional activity (RW) Dynamic Standing - Level of Assistance: 5: Stand by assistance (supervision) Dynamic Standing - Comments: with transfers and gait Extremity/Trunk  Assessment RUE Assessment RUE Assessment: Exceptions to [facility] Active Range of Motion (AROM) Comments: [facility] Strength Comments: 4/5, limited by shoulder pain with pressure 2/2 s/p torn rotator cuff over a year ago LUE Assessment General Strength Comments: 3-/5 left shoulder, 3+/5 elbow, wrist, and grip   Hope E Kluttz, MS, OTR/L  06/15/2021, 12:50 PM

## 2021-06-15 NOTE — Progress Notes (Signed)
Patient ID: Dalton Miller, male   DOB: [birth year], 76 y.o.   MRN: [id number]  Patient DME and follow up recommendations sent to [facility] with orders.

## 2021-06-15 NOTE — Progress Notes (Signed)
Occupational Therapy Session Note  Patient Details  Name: Dalton Miller MRN: [id number] Date of Birth: 08-Mar-1945  Today's Date: 06/15/2021 OT Individual Time: 0905-1000 & 1300-1412 OT Individual Time Calculation (min): 55 min & 72 min   Short Term Goals: Week 2:  OT Short Term Goal 1 (Week 2): STG = LTG 2/2 ELOS  Skilled Therapeutic Interventions/Progress Updates:  Session 1 Skilled OT intervention completed with focus on pain management, BUE A/AAROM. Pt received seated EOB, no c/o pain, agreeable to session. Sit > stand using RW with min A then ambulatory transfer using RW (about 155 ft) from room to day room with supervision. Pt with report of R shoulder feeling "stiff" with therapist applying hot pack and ACE wrap for makeshift sling for pain relief to R shoulder. Seated at table top, pt completed the following to promote fluid AAROM of the BUE needed for progressing to AROM with ADLs:  -Table slides- 3x15 each for shoulder flexion, shoulder horizontal abduction R<>L, unilateral external rotation on each side -1 pound dowel for AAROM, shoulder flexion 2x10, shoulder abduction x10  Transported pt dependently in w/c 2/2 fatigue per report, with pt left seated in w/c with chair alarm on and all needs in reach at end of session.  Session 2 Skilled OT intervention completed with focus on functional endurance and review of AE during ADL retraining as pt reported his son purchased hip kit for use at home. Pt received upright in bed, asleep, easily woken. Minimal, un-rated pain reported in L hip, with therapist providing rest breaks and repositioning for pain reduction throughout session. Completed bed mobility with supervision using hooking BLE method, then with increased time during transitions completed sit > stand using RW with min A then ambulatory transfers during session in room for about 30 total feet using RW with supervision A. CGA side step to shower chair with use of grab bars. Pt able to  maintain stance position for >2 mins for therapist to donn waterproof cover over L hip bandages per nursing. Completed entire shower with set up A with use of LH sponge, including pericare at stance level. CGA sit > stand and side step using grab bars to RW to go to EOB for donning clothing with AE. Donned shirt with mod I, supervision for LB clothing with use of reacher with increased time for technique. Cues needed for where to pinch on pants, as well as reminder of donning weaker side first. Donned socks with sock aid using mod I, with increased time as L hand with fine motor control weakness. verbal cues needed for donning grippers on top/bottom vs side of sock aid to promote efficiency with donning over foot. Attempted to urinate in stance with use of urinal however not able, but with supervision assist for >3 mins standing balance using RW. Side step towards [facility] with supervision. Completed bed mobility using hook method for LLE with supervision. Pt was left upright in bed, with bed alarm on and all needs in reach at end of session.   Therapy Documentation Precautions:  Precautions Precautions: Fall Restrictions Weight Bearing Restrictions: Yes LLE Weight Bearing: Weight bearing as tolerated    Therapy/Group: Individual Therapy  Blase Mess, MS, OTR/L  06/15/2021, 7:28 AM

## 2021-06-15 NOTE — Progress Notes (Signed)
Physical Therapy Session Note  Patient Details  Name: Dalton Miller MRN: [id number] Date of Birth: [birth year]  Today's Date: 06/15/2021 PT Individual Time: 1601-0932 PT Individual Time Calculation (min): 70 min   Short Term Goals: Week 1:  PT Short Term Goal 1 (Week 1): Pt will complete bed mobility with min A consistently PT Short Term Goal 1 - Progress (Week 1): Progressing toward goal PT Short Term Goal 2 (Week 1): Pt will complete least restrictive transfer with mod A consistently PT Short Term Goal 2 - Progress (Week 1): Met PT Short Term Goal 3 (Week 1): Pt will initiate gait training PT Short Term Goal 3 - Progress (Week 1): Met PT Short Term Goal 4 (Week 1): Pt will propel w/c x 50 ft with min A PT Short Term Goal 4 - Progress (Week 1): Met Week 2:  PT Short Term Goal 1 (Week 2): STG=LTG due to LOS  Skilled Therapeutic Interventions/Progress Updates:   Received pt sitting in WC, pt agreeable to PT treatment, and reported pain 8/10 in L hip (premedicated) - repositioning, rest breaks, and activity modification done to reduce pain levels. Session with emphasis on functional mobility/transfers, generalized strengthening and endurance, dynamic standing balance/coordination, simulated car transfers, and gait training. Pt transported to/from room in [facility] dependently for time management and energy conservation purposes. Pt reported he has decided he will be discharging in his truck (with running board) with his son assisting. Placed 6in step in front of car to simulate truck height and pt performed simulated car transfer with RW and min A - cues for technique and RW safety. In dayroom, pt transferred on/off Nustep with RW and supervision and performed BLE strengthening on Nustep at workload 2 for 12 minutes for a total of 182 steps with emphasis on cardiovascular endurance and L hip ROM. Pt required assist to get LLE on footplate. Pt moves at slow speed and is easily distracted telling stories - but  easily redirected. Sit<>stand with RW and CGA and pt ambulated 67f with RW and close supervision - limited by increased pain in L hip. Pt requested to return to bed and ambulated 575fwith RW and close supervision to bed - stopped and attempted to void in urinal, but unsuccessful. Pt transferred sit<>supine with supervision, hooklying LLE with RLE. Concluded session with pt semi-reclined in bed, needs within reach, and bed alarm on. Elevated LLE on pillow and provided pt with ice pack for L hip.   Therapy Documentation Precautions:  Precautions Precautions: Fall Restrictions Weight Bearing Restrictions: Yes LLE Weight Bearing: Weight bearing as tolerated  Therapy/Group: Individual Therapy AnAlfonse AlpersT, DPT  06/15/2021, 7:08 AM

## 2021-06-15 NOTE — Progress Notes (Signed)
PROGRESS NOTE   Subjective/Complaints: It bed this AM. No concerns.   ROS: No fever,chills, SOB, HA, CP. No Abdominal pain or nausea.  Objective:   No results found. Recent Labs    06/14/21 0645  WBC 4.4  HGB 7.6*  HCT 24.1*  PLT 152      No results for input(s): "NA", "K", "CL", "CO2", "GLUCOSE", "BUN", "CREATININE", "CALCIUM" in the last 72 hours.    Intake/Output Summary (Last 24 hours) at 06/15/2021 0746 Last data filed at 06/15/2021 0225 Gross per 24 hour  Intake 860 ml  Output 1305 ml  Net -445 ml         Physical Exam: Vital Signs Blood pressure 104/60, pulse 67, temperature (!) 97.5 F (36.4 C), temperature source Oral, resp. rate 17, height 5\' 7"  (1.702 m), weight 71.6 kg, SpO2 94 %.  Constitutional: No distress . Vital signs reviewed. Sitting in bed HEENT: NCAT,  oral membranes moist Neck: supple Cardiovascular: RRR without murmur. Respiratory/Chest: CTA Bilaterally without wheezes or rales. Normal effort    GI/Abdomen: BS +, soft,  non-tender, non-distended Ext: no clubbing, cyanosis, or edema Psych: very pleasant Musculoskeletal:        General: Swelling present left hip.     Cervical back: Neck supple. No tenderness.     Comments: RUE 5/5 LUE 4/5 due to previous stroke RLE 5/5 LLE- HF 2/5; 4/5 in KE/DF and PF   Skin:    General: Skin is warm and dry.     Comments: Multiple healed skin tears on bilateral forearms improving.    Left hip wound dressed  Neurological:     Mental Status: He is alert and oriented to person, place, and time.  Follows commands, answers questions    Comments: Mild left facial weakness with LUE weakness ongoing. LLE limited due to pain. Fair insight and awareness      Assessment/Plan: 1. Functional deficits which require 3+ hours per day of interdisciplinary therapy in a comprehensive inpatient rehab setting. Physiatrist is providing close team supervision  and 24 hour management of active medical problems listed below. Physiatrist and rehab team continue to assess barriers to discharge/monitor patient progress toward functional and medical goals  Care Tool:  Bathing    Body parts bathed by patient: Right arm, Left arm, Chest, Abdomen, Front perineal area, Right upper leg, Left upper leg, Right lower leg, Left lower leg, Face, Buttocks   Body parts bathed by helper: Buttocks     Bathing assist Assist Level: Set up assist     Upper Body Dressing/Undressing Upper body dressing   What is the patient wearing?: Pull over shirt    Upper body assist Assist Level: Set up assist    Lower Body Dressing/Undressing Lower body dressing      What is the patient wearing?: Pants     Lower body assist Assist for lower body dressing: Minimal Assistance - Patient > 75%     Toileting Toileting    Toileting assist Assist for toileting: Contact Guard/Touching assist     Transfers Chair/bed transfer  Transfers assist     Chair/bed transfer assist level: Contact Guard/Touching assist     Locomotion Ambulation  Ambulation assist   Ambulation activity did not occur: Safety/medical concerns  Assist level: Supervision/Verbal cueing Assistive device: Walker-rolling Max distance: 176ft   Walk 10 feet activity   Assist  Walk 10 feet activity did not occur: Safety/medical concerns  Assist level: Supervision/Verbal cueing Assistive device: Walker-rolling   Walk 50 feet activity   Assist Walk 50 feet with 2 turns activity did not occur: Safety/medical concerns  Assist level: Supervision/Verbal cueing Assistive device: Walker-rolling    Walk 150 feet activity   Assist Walk 150 feet activity did not occur: Safety/medical concerns  Assist level: Supervision/Verbal cueing Assistive device: Walker-rolling    Walk 10 feet on uneven surface  activity   Assist Walk 10 feet on uneven surfaces activity did not occur:  Safety/medical concerns         Wheelchair     Assist Is the patient using a wheelchair?: Yes Type of Wheelchair: Manual    Wheelchair assist level: Minimal Assistance - Patient > 75% Max wheelchair distance: 11ft    Wheelchair 50 feet with 2 turns activity    Assist        Assist Level: Minimal Assistance - Patient > 75%   Wheelchair 150 feet activity     Assist      Assist Level: Total Assistance - Patient < 25%   Blood pressure 104/60, pulse 67, temperature (!) 97.5 F (36.4 C), temperature source Oral, resp. rate 17, height 5\' 7"  (1.702 m), weight 71.6 kg, SpO2 94 %.    Medical Problem List and Plan: 1. Functional deficits secondary to L hip fx in setting of previous L hemiparesis             -patient may  shower             -ELOS/Goals: 14-16 days mod I to supervision  -Continue CIR therapies including PT, OT   -Worked on car/truck transfers today with PT 2.  Impaired mobility and ADLs: continue Eliquis 2.5mg  BID because was refusing Lovenox shots. Discussed with patient.  3. Left hip pain: continue Tylenol and/or oxycodone prn. Increase Gabapentin to 200mg  TID. Discussed scheduling Tylenol but he does not find tylenol helpful. Continue ice.  4. Insomnia: continue Trazodone 50mg  HS 5. Neuropsych: This patient is capable of making decisions on his own behalf. 6. Skin/Wound Care: Surgical dressing to stay in place for 2 weeks or till f/u with ortho             --routine pressure relief measures.  7. Hypoalbuminemia: d/c Juven given hyperglycemia. Encourage high protein foods.   8. Cirrhosis of the liver: Thrombocytopenia noted at admission 93-->72-->88. Monitor for signs of bleeding.              --discussed that Ammonia is stable. Continue lactulose             --continue Rifaximin  9. Hyponatremia: Multifactorial. Will recheck in am. -Stable at 135 on 06/08/21, follow 10. T2DM: Hgb A1c- 6.4 and diet controlled.  --Poorly controlled post op likely  due to stress and immobility.              --decrease CBGS checks to [facility] and use SSI for elevated BS             --change diet to Carb Modified.   -add metformin 250mg  with lunch.   CBG (last 3)  Recent Labs    06/13/21 0546 06/14/21 0620 06/15/21 0602  GLUCAP 133* 130* 151*      -6/13 glucose  well controlled overall, continue metformin 11. Constipation: Has not had BM since admission. Has been refusing laxatives per nursing.  12. H/o melanoma/Lung CA w/mets/COPD: Encourage tobacco cessation.             --respiratory status stable. Not on MDI at home.  13. H/o GIB: continue PPI bid.  14. Insomnia: On Trazodone and melatonin.  15. HTN: Monitor BP TID--continue Norvasc daily. Discontinue Flomax 0.4mg  HS -6/11 BP well controlled, flomax restarted for urinary retention -6/13 BP continues to be well controlled 16. Low grade fever: Encourage IS.  17. Urinary retention: Will monitor voiding with PVR checks. discontinue Flomax 0.4mg  HS             --toilet every 4 hours.   -6/10 flomax restarted. Pvr's <100cc, he's continent 18. L-MCA stroke: On ASA for stroke prophylaxis--resume.  19. Constipation: Resolved             --Continue Lactulose for OIC.   -6/13 PRN miralax given today, monitor if he has results tomorrow 20. Sinusitis: recommended bee pollen and local honey outpatient  LOS: 14 days A FACE TO FACE EVALUATION WAS PERFORMED  Jennye Boroughs 06/15/2021, 7:46 AM

## 2021-06-16 ENCOUNTER — Encounter: Payer: Self-pay | Admitting: Physical Medicine and Rehabilitation

## 2021-06-16 LAB — GLUCOSE, CAPILLARY: Glucose-Capillary: 123 mg/dL — ABNORMAL HIGH (ref 70–99)

## 2021-06-16 MED ORDER — DICLOFENAC SODIUM 1 % EX GEL
2.0000 g | Freq: Four times a day (QID) | CUTANEOUS | Status: DC
  Administered 2021-06-16 – 2021-06-17 (×5): 2 g via TOPICAL
  Filled 2021-06-16: qty 100

## 2021-06-16 MED ORDER — GABAPENTIN 300 MG PO CAPS
300.0000 mg | ORAL_CAPSULE | Freq: Three times a day (TID) | ORAL | Status: DC
  Administered 2021-06-16 – 2021-06-18 (×6): 300 mg via ORAL
  Filled 2021-06-16 (×6): qty 1

## 2021-06-16 NOTE — Progress Notes (Signed)
Occupational Therapy Session Note  Patient Details  Name: Dalton Miller MRN: [id number] Date of Birth: 02-17-1945  Today's Date: 06/16/2021 OT Individual Time: 6270-3500 OT Individual Time Calculation (min): 30 min    Short Term Goals: Week 2:  OT Short Term Goal 1 (Week 2): STG = LTG 2/2 ELOS  Skilled Therapeutic Interventions/Progress Updates:    S: Pt requested to use Nustep during session.  -Patient participated in skilled OT session with therapist facilitating utilization of both bilateral upper and lower extremities while using NuStep. Focused on strength and endurance with patient provided verbal cues to try and maintain consistent pace which would promote motor coordination and self awareness, in addition to strength and endurance.  - Pt completed functional transfer to and from NuStep from w/c with CGA and RW.    Therapy Documentation Precautions:  Precautions Precautions: Fall Restrictions Weight Bearing Restrictions: No LLE Weight Bearing: Weight bearing as tolerated  Pain:   No pain reported during session.   Therapy/Group: Individual Therapy  Ailene Ravel, OTR/L,CBIS  Supplemental OT - [place] and [facility]  06/16/2021, 4:25 PM

## 2021-06-16 NOTE — Progress Notes (Signed)
Physical Therapy Session Note  Patient Details  Name: Dalton Miller MRN: [id number] Date of Birth: [birth year]  Today's Date: 06/16/2021 PT Individual Time: 4401-0272 and 1400-1455 PT Individual Time Calculation (min): 60 min and 55 min  Short Term Goals: Week 1:  PT Short Term Goal 1 (Week 1): Pt will complete bed mobility with min A consistently PT Short Term Goal 1 - Progress (Week 1): Progressing toward goal PT Short Term Goal 2 (Week 1): Pt will complete least restrictive transfer with mod A consistently PT Short Term Goal 2 - Progress (Week 1): Met PT Short Term Goal 3 (Week 1): Pt will initiate gait training PT Short Term Goal 3 - Progress (Week 1): Met PT Short Term Goal 4 (Week 1): Pt will propel w/c x 50 ft with min A PT Short Term Goal 4 - Progress (Week 1): Met Week 2:  PT Short Term Goal 1 (Week 2): STG=LTG due to LOS  Skilled Therapeutic Interventions/Progress Updates:   Treatment Session 1 Received pt sitting in WC, pt agreeable to PT treatment, and reported pain 6/10 in L hip (premedicated) and c/o tingling/burning in LEs from neuropathy - MD aware and adjusting medications. Session with emphasis on discharge planning, functional mobility/transfers, generalized strengthening and endurance, dynamic standing balance/coordination, and gait training. Went through pain interference, sensation, and MMT then performed WC mobility 164f using BUE and min A - required assist to prevent from running into obstacles on L side. Pt [facility] outside to [facility] of [facility]. Pt performed all transfers with RW and close supervision/CGA and ambulated 136fx 1 and 4366f 2 trials with RW and supervision over uneven concrete and up/down mild slopes. Pt appreciative of spending time outside for improved emotional/mental health, but easily distracted telling therapist stories. Pt handed off to OT in dayroom at end of session.   Treatment Session 2 Received pt semi-reclined in bed, pt  agreeable to PT treatment, and denied any pain at rest just "stiffness" along anterior aspect of L leg. Pt reported his hospital bed was delivered today. Session with emphasis on functional mobility/transfers, generalized strengthening and endurance, dynamic standing balance/coordination, and gait training. Pt transferred semi-reclined<>sitting EOB with HOB slightly elevated and supervision. Pt transferred bed<>WC stand<>pivot with RW and supervision. [facility]. Sit<>stand with RW and CGA and pt ambulated [id number] RW and close supervision. Pt reported feeling "good" this afternoon and able to bear more weight through LLE today. Pt performed standing alternating toe taps to 3in step 2x10 bilaterally with BUE support and close supervision with emphasis on L hip flexor strengthening and weight shifting to LLE. Stood with RW and CGA and pt ambulated additional [id number] RW and supervision - this time decreased stance time on LLE due to increased pain. Stand<>pivot mat<>WC with RW and supervision/CGA. Returned to room and pt requested to return to bed. Stand<>pivot WC<>bed with RW and supervision and sit<>supine with supervision, using RLE to hook LLE. Concluded session with pt semi-reclined in bed, needs within reach, and bed alarm on. Provided pt with ice pack for L hip.   Therapy Documentation Precautions:  Precautions Precautions: Fall Restrictions Weight Bearing Restrictions: Yes LLE Weight Bearing: Weight bearing as tolerated  Therapy/Group: Individual Therapy Dalton Miller Dalton AlpersDPT  06/16/2021, 7:04 AM

## 2021-06-16 NOTE — Progress Notes (Signed)
Occupational Therapy Session Note  Patient Details  Name: Dalton Miller MRN: [id number] Date of Birth: [birth year]  Today's Date: 06/16/2021 OT Individual Time: 7530-0511 OT Individual Time Calculation (min): 55 min    Short Term Goals: Week 2:  OT Short Term Goal 1 (Week 2): STG = LTG 2/2 ELOS  Skilled Therapeutic Interventions/Progress Updates:  Skilled OT intervention completed with focus on modification education for grooming and oral care tasks, A/AAROM of BUE and fine motor tasks. Pt received upright in bed, asleep, easily woken, with 8/10 neuropathic pain in bilateral heels with nursing and MD aware and with plan to bump pt's pain meds up- therapist offering seated activities and modifications for pain reduction. Completed bed mobility with min A this session for boosting trunk and supervision with increased time to EOB. Donned socks for time with total A. CGA needed for sit > stand using RW then stand pivot with RW to w/c. Seated at sink, pt cleaned his dentures, brushed his remaining teeth with initial min A for twist cap lids however with education provided on using weaker L hand for stabilizing toothpaste on counter then twisting with fine motor control on stronger R hand, pt able to do with mod I. Transported dependently in w/c <> gym 2/2 pain.  Therapist issued pt a HEP for BUE A/AA/ROM and LUE fine motor activities once home, with pt demonstrating the following:   PVC pipe for shoulder flexion, chest presses, abduction and external rotation Scapular retraction Trapezius lateral stretches with 3 sec hold Fine motor thera-putty exercises including gross/fine motor grasping/pinching, pulling of putty and rolling  Pt with good form and no further questions with light cues provided for technique. Pt was left seated in w/c, with chair alarm on and all needs in reach at end of session.   Therapy Documentation Precautions:  Precautions Precautions: Fall Restrictions Weight Bearing  Restrictions: No LLE Weight Bearing: Weight bearing as tolerated    Therapy/Group: Individual Therapy  Blase Mess, MS, OTR/L  06/16/2021, 7:39 AM

## 2021-06-16 NOTE — Patient Care Conference (Signed)
Inpatient RehabilitationTeam Conference and Plan of Care Update Date: 06/16/2021   Time: 11:32 AM   Patient Name: Dalton Miller      Medical Record Number: [id number]  Date of Birth: 01/22/1945 Sex: Male         Room/Bed: 4W22C/4W22C-01 Payor Info: Payor: [employer] / Plan: [place] / Product Type: *No Product type* /    Admit Date/Time:  06/01/2021 12:58 PM  Primary Diagnosis:  Hip fracture requiring operative repair [facility])  Hospital Problems: Principal Problem:   Hip fracture requiring operative repair [facility])    Expected Discharge Date: Expected Discharge Date: 06/18/21  Team Members Present: Physician leading conference: Dr. Leeroy Cha Social Worker Present: Erlene Quan, [place] Nurse Present: Dorien Chihuahua, RN OT Present: Jennefer Bravo, OT PPS Coordinator present : Gunnar Fusi, SLP     Current Status/Progress Goal Weekly Team Focus  Bowel/Bladder   Pt is continent of bowel/bladder  Pt will remain continent of bowel/bladder  Will assess qshift and PRN   Swallow/Nutrition/ Hydration             ADL's   Set up bathing with AE, set up UB dressing, min A LB dressing, CGA toileting  upgraded goals min A to supervision  d/c planning, safety awareness, HEP   Mobility   bed mobility supervision using bed features, transfers with RW CGA/min A, gait >137ft with RW and CGA/supervision  supervision  functional mobility/transfers, generalized strengthening and endurance, dynamic standing balance/coordination, pain management, D/C planning, and gait training.   Communication             Safety/Cognition/ Behavioral Observations            Pain   Pt is currently pain free  Pt will remain pain free  Will assess qshift and PRN   Skin   Pt's skin is intact  Pt's skin will remain intact  Will assess qshift and PRN     Discharge Planning:  Lives with children and returning. able to provide 24/7   Team Discussion: Patient with heel pain, big  toe pain but hip pain is beter. MD adjusted medication. Constipation addressed.   Patient on target to meet rehab goals: yes, currently needs set up assist for bathing with adaptive equipment. Needs mod assist for upper body care and supervision for lower body dressing. Completes transfers with CGA and can ambulate up to 150' using a RW with CGA.   *See Care Plan and progress notes for long and short-term goals.   Revisions to Treatment Plan:  N/A   Teaching Needs: Safety, skin care, medications, transfers, toileting, etc  Current Barriers to Discharge: Decreased caregiver support  Possible Resolutions to Barriers: HH follow up DME: w/c, hospital bed, BSC; already has a TTB and TW     Medical Summary Current Status: bilateral heel pain, bilateral big toe pain, denies diarrhea, voiding well  Barriers to Discharge: Medical stability  Barriers to Discharge Comments: bilateral heel pain, bilateral big toe pain Possible Resolutions to [employer] Focus: increase gabapentin to 300mg  TID, add voltaren gel   Continued Need for Acute Rehabilitation Level of Care: The patient requires daily medical management by a physician with specialized training in physical medicine and rehabilitation for the following reasons: Direction of a multidisciplinary physical rehabilitation program to maximize functional independence : Yes Medical management of patient stability for increased activity during participation in an intensive rehabilitation regime.: Yes Analysis of laboratory values and/or radiology reports with any subsequent need for medication adjustment  and/or medical intervention. : Yes   I attest that I was present, lead the team conference, and concur with the assessment and plan of the team.   Dorien Chihuahua B 06/16/2021, 3:06 PM

## 2021-06-16 NOTE — Progress Notes (Signed)
Patient ID: Dalton Miller, male   DOB: [birth year], 76 y.o.   MRN: [id number]  Sw left VM for patient covering [facility], [place] in reference to discharge recommendations. Sw waiting on follow up of status.

## 2021-06-16 NOTE — Progress Notes (Signed)
Physical Therapy Discharge Summary  Patient Details  Name: Dalton Miller MRN: [id number] Date of Birth: 01-23-1945  Patient has met 5 of 8 long term goals due to improved activity tolerance, improved balance, improved postural control, increased strength, increased range of motion, ability to compensate for deficits, and improved coordination. Patient to discharge at a wheelchair level Supervision with RW. Patient's care partner is independent to provide the necessary physical assistance at discharge. Pt's son did not attend family education training, however pt's son was providing supervision/assist prior to admission.   Reasons goals not met: Pt did not meet WC mobility goal of supervision as pt currently requires min A for WC mobility up to 146f due to weakness in LUE from prior CVA. Pt did not meet car transfer goal of CGA as pt currently requires min A for LLE management due to pain/weakness.   Recommendation:  Patient will benefit from ongoing skilled PT services in home health setting to continue to advance safe functional mobility, address ongoing impairments in transfers, generalized strengthening and endurance, dynamic standing balance/coordination, gait training, and to minimize fall risk.  Equipment: 16x16 manual WC, hospital bed, already has RW  Reasons for discharge: treatment goals met  Patient/family agrees with progress made and goals achieved: Yes  PT Discharge Precautions/Restrictions Precautions Precautions: Fall Restrictions Weight Bearing Restrictions: No Pain Interference Pain Interference Pain Effect on Sleep: 2. Occasionally Pain Interference with Therapy Activities: 1. Rarely or not at all Pain Interference with Day-to-Day Activities: 1. Rarely or not at all Cognition Overall Cognitive Status: Within Functional Limits for tasks assessed Arousal/Alertness: Awake/alert Orientation Level: Oriented X4 Memory: Appears intact Awareness: Appears intact Problem  Solving: Appears intact Safety/Judgment: Appears intact Sensation Sensation Light Touch: Impaired Detail Proprioception: Appears Intact Additional Comments: pt reported decreased sensation in LLE>RLE and tingling in LLE>RLE from neuropathy Coordination Gross Motor Movements are Fluid and Coordinated: No Fine Motor Movements are Fluid and Coordinated: No Coordination and Movement Description: mild L hemi from previous CVA and generalized weakness/deconditioning Finger Nose Finger Test: WFL on RUE, dysmetria and decreased precision on LUE from prior CVA Heel Shin Test: WWestgl[facility]oscopy Centeron RLE and decreased ROM on LLE requiring use of UEs to assist Motor  Motor Motor: Hemiplegia Motor - Skilled Clinical Observations: mild L hemi from previous CVA and generalized weakness/deconditioning  Mobility Bed Mobility Bed Mobility: Rolling Right;Rolling Left;Sit to Supine;Supine to Sit Rolling Right: Supervision/verbal cueing Rolling Left: Supervision/Verbal cueing Supine to Sit: Supervision/Verbal cueing Sit to Supine: Supervision/Verbal cueing Transfers Transfers: Sit to Stand;Stand to Sit;Stand Pivot Transfers Sit to Stand: Supervision/Verbal cueing Stand to Sit: Supervision/Verbal cueing Stand Pivot Transfers: Supervision/Verbal cueing Transfer (Assistive device): Rolling walker Locomotion  Gait Ambulation: Yes Gait Assistance: Supervision/Verbal cueing Gait Distance (Feet): 180 Feet Assistive device: Rolling walker Gait Assistance Details: Verbal cues for gait pattern Gait Assistance Details: verbal cues for increased step length Gait Gait: Yes Gait Pattern: Impaired Gait Pattern: Step-to pattern;Step-through pattern;Decreased step length - right;Decreased step length - left;Decreased stance time - left;Decreased stride length;Decreased weight shift to left;Decreased trunk rotation;Antalgic;Poor foot clearance - left;Poor foot clearance - right;Narrow base of support;Trunk flexed Gait velocity:  decreased Stairs / Additional Locomotion Stairs: No Ramp: Supervision/Verbal cueing (RW) WProduct managerMobility: Yes Wheelchair Assistance: Minimal assistance - Patient >75% Wheelchair Propulsion: Both upper extremities Wheelchair Parts Management: Needs assistance Distance: 1238f Trunk/Postural Assessment  Cervical Assessment Cervical Assessment: Exceptions to WFSurgicare Gwinnettforward head) Thoracic Assessment Thoracic Assessment: Within Functional Limits Lumbar Assessment Lumbar Assessment: Exceptions to WFLb Surgery Center LLCposterior  pelvic tilt) Postural Control Postural Control: Within Functional Limits  Balance Balance Balance Assessed: Yes Static Sitting Balance Static Sitting - Balance Support: No upper extremity supported;Feet supported Static Sitting - Level of Assistance: 6: Modified independent (Device/Increase time) Dynamic Sitting Balance Dynamic Sitting - Balance Support: No upper extremity supported;Feet supported Dynamic Sitting - Level of Assistance: 5: Stand by assistance (distant supervision) Static Standing Balance Static Standing - Balance Support: Bilateral upper extremity supported;During functional activity (RW) Static Standing - Level of Assistance: 5: Stand by assistance (supervision) Dynamic Standing Balance Dynamic Standing - Balance Support: Bilateral upper extremity supported;During functional activity (RW) Dynamic Standing - Level of Assistance: 5: Stand by assistance (supervision) Dynamic Standing - Comments: with transfers and gait Extremity Assessment  RLE Assessment RLE Assessment: Exceptions to [facility] RLE Strength Right Hip Flexion: 4-/5 Right Hip ABduction: 4/5 Right Hip ADduction: 4/5 Right Knee Flexion: 4/5 Right Knee Extension: 4/5 Right Ankle Dorsiflexion: 4/5 Right Ankle Plantar Flexion: 4/5 LLE Assessment LLE Assessment: Exceptions to [facility] LLE Strength Left Hip Flexion: 3-/5 Left Hip ABduction: 4-/5 Left Hip ADduction: 4-/5 Left Knee  Flexion: 3+/5 Left Knee Extension: 3+/5 Left Ankle Dorsiflexion: 4/5 Left Ankle Plantar Flexion: 4/5  Lennix Kneisel M Lyn Hollingshead PT, DPT  06/16/2021, 7:10 AM

## 2021-06-16 NOTE — Progress Notes (Signed)
PROGRESS NOTE   Subjective/Complaints: Complaining of burning pain in bilateral heels and in big toes Left sided thigh pain improved Agreeable to increasing Gabapentin dose  ROS: No fever,chills, SOB, HA, CP. No Abdominal pain or nausea., +peripheral neuropathy  Objective:   No results found. Recent Labs    06/14/21 0645  WBC 4.4  HGB 7.6*  HCT 24.1*  PLT 152     Recent Labs    06/15/21 1212  NA 134*  K 4.6  CL 105  CO2 24  GLUCOSE 150*  BUN 24*  CREATININE 1.17  CALCIUM 8.9      Intake/Output Summary (Last 24 hours) at 06/16/2021 1131 Last data filed at 06/16/2021 0700 Gross per 24 hour  Intake 560 ml  Output 1050 ml  Net -490 ml        Physical Exam: Vital Signs Blood pressure 107/62, pulse 66, temperature 98.2 F (36.8 C), temperature source Oral, resp. rate 18, height 5\' 7"  (1.702 m), weight 71.6 kg, SpO2 94 %.  Constitutional: No distress . Vital signs reviewed. Sitting in bed HEENT: NCAT,  oral membranes moist Neck: supple Cardiovascular: RRR without murmur. Respiratory/Chest: CTA Bilaterally without wheezes or rales. Normal effort    GI/Abdomen: BS +, soft,  non-tender, non-distended Ext: no clubbing, cyanosis, or edema Psych: very pleasant Musculoskeletal:        General: Swelling present left hip.     Cervical back: Neck supple. No tenderness.     Comments: RUE 5/5 LUE 4/5 due to previous stroke RLE 5/5 LLE- HF 2/5; 4/5 in KE/DF and PF   Skin:    General: Skin is warm and dry.     Comments: Multiple healed skin tears on bilateral forearms improving.  +redness of big toes   Left hip wound dressed  Neurological:     Mental Status: He is alert and oriented to person, place, and time.  Follows commands, answers questions    Comments: Mild left facial weakness with LUE weakness ongoing. LLE limited due to pain. Fair insight and awareness      Assessment/Plan: 1. Functional  deficits which require 3+ hours per day of interdisciplinary therapy in a comprehensive inpatient rehab setting. Physiatrist is providing close team supervision and 24 hour management of active medical problems listed below. Physiatrist and rehab team continue to assess barriers to discharge/monitor patient progress toward functional and medical goals  Care Tool:  Bathing    Body parts bathed by patient: Right arm, Left arm, Chest, Abdomen, Front perineal area, Right upper leg, Left upper leg, Right lower leg, Left lower leg, Face, Buttocks   Body parts bathed by helper: Buttocks     Bathing assist Assist Level: Set up assist     Upper Body Dressing/Undressing Upper body dressing   What is the patient wearing?: Pull over shirt    Upper body assist Assist Level: Set up assist    Lower Body Dressing/Undressing Lower body dressing      What is the patient wearing?: Pants     Lower body assist Assist for lower body dressing: Supervision/Verbal cueing     Toileting Toileting    Toileting assist Assist for toileting: Contact Guard/Touching  assist     Transfers Chair/bed transfer  Transfers assist     Chair/bed transfer assist level: Supervision/Verbal cueing     Locomotion Ambulation   Ambulation assist   Ambulation activity did not occur: Safety/medical concerns  Assist level: Supervision/Verbal cueing Assistive device: Walker-rolling Max distance: 156ft   Walk 10 feet activity   Assist  Walk 10 feet activity did not occur: Safety/medical concerns  Assist level: Supervision/Verbal cueing Assistive device: Walker-rolling   Walk 50 feet activity   Assist Walk 50 feet with 2 turns activity did not occur: Safety/medical concerns  Assist level: Supervision/Verbal cueing Assistive device: Walker-rolling    Walk 150 feet activity   Assist Walk 150 feet activity did not occur: Safety/medical concerns  Assist level: Supervision/Verbal  cueing Assistive device: Walker-rolling    Walk 10 feet on uneven surface  activity   Assist Walk 10 feet on uneven surfaces activity did not occur: Safety/medical concerns         Wheelchair     Assist Is the patient using a wheelchair?: Yes Type of Wheelchair: Manual    Wheelchair assist level: Minimal Assistance - Patient > 75% Max wheelchair distance: 150ft    Wheelchair 50 feet with 2 turns activity    Assist        Assist Level: Minimal Assistance - Patient > 75%   Wheelchair 150 feet activity     Assist      Assist Level: Total Assistance - Patient < 25%   Blood pressure 107/62, pulse 66, temperature 98.2 F (36.8 C), temperature source Oral, resp. rate 18, height 5\' 7"  (1.702 m), weight 71.6 kg, SpO2 94 %.    Medical Problem List and Plan: 1. Functional deficits secondary to L hip fx in setting of previous L hemiparesis             -patient may  shower             -ELOS/Goals: 14-16 days mod I to supervision  -Continue CIR therapies including PT, OT   -Worked on car/truck transfers today with PT  -Interdisciplinary Team Conference today   2.  Impaired mobility and ADLs: continue Eliquis 2.5mg  BID because was refusing Lovenox shots. Discussed with patient.  3. Left hip pain: continue Tylenol and/or oxycodone prn. Increase Gabapentin to 300mg  TID. Discussed scheduling Tylenol but he does not find tylenol helpful. Continue ice.  4. Insomnia: continue Trazodone 50mg  HS 5. Neuropsych: This patient is capable of making decisions on his own behalf. 6. Skin/Wound Care: Surgical dressing to stay in place for 2 weeks or till f/u with ortho             --routine pressure relief measures.  7. Hypoalbuminemia: d/c Juven given hyperglycemia. Encourage high protein foods.   8. Cirrhosis of the liver: Thrombocytopenia noted at admission 93-->72-->88. Monitor for signs of bleeding.              --discussed that Ammonia is stable. Continue lactulose              --continue Rifaximin  9. Hyponatremia: Multifactorial. Will recheck in am. -Stable at 135 on 06/08/21, follow 10. T2DM: Hgb A1c- 6.4 and diet controlled.  --Poorly controlled post op likely due to stress and immobility.              --decrease CBGS checks to [facility] and use SSI for elevated BS             --change diet to Carb Modified.   -  add metformin 250mg  with lunch.   CBG (last 3)  Recent Labs    06/14/21 0620 06/15/21 0602 06/16/21 0546  GLUCAP 130* 151* 123*     -6/13 glucose well controlled overall, continue metformin 11. Constipation: Has not had BM since admission. Has been refusing laxatives per nursing.  12. H/o melanoma/Lung CA w/mets/COPD: Encourage tobacco cessation.             --respiratory status stable. Not on MDI at home.  13. H/o GIB: continue PPI bid.  14. Insomnia: On Trazodone and melatonin.  15. HTN: Monitor BP TID--continue Norvasc daily. Discontinue Flomax 0.4mg  HS -6/11 BP well controlled, flomax restarted for urinary retention -6/13 BP continues to be well controlled 16. Low grade fever: Encourage IS.  17. Urinary retention: Will monitor voiding with PVR checks. discontinue Flomax 0.4mg  HS             --toilet every 4 hours.   -6/10 flomax restarted. Pvr's <100cc, he's continent 18. L-MCA stroke: On ASA for stroke prophylaxis--resume.  19. Constipation: Resolved             --Continue Lactulose for OIC.   -6/13 PRN miralax given today, monitor if he has results tomorrow 20. Sinusitis: recommended bee pollen and local honey outpatient 21. Redness and pain in bilateral big toes: voltaren gel ordered  LOS: 15 days A FACE TO FACE EVALUATION WAS PERFORMED  Martha Clan P Roneshia Drew 06/16/2021, 11:31 AM

## 2021-06-17 LAB — GLUCOSE, CAPILLARY: Glucose-Capillary: 137 mg/dL — ABNORMAL HIGH (ref 70–99)

## 2021-06-17 MED ORDER — GABAPENTIN 300 MG PO CAPS
300.0000 mg | ORAL_CAPSULE | Freq: Three times a day (TID) | ORAL | 0 refills | Status: AC
  Filled 2021-06-17 – 2021-06-18 (×2): qty 90, 30d supply, fill #0

## 2021-06-17 MED ORDER — APIXABAN 2.5 MG PO TABS
2.5000 mg | ORAL_TABLET | Freq: Two times a day (BID) | ORAL | 0 refills | Status: DC
  Filled 2021-06-17 – 2021-06-18 (×2): qty 60, 30d supply, fill #0

## 2021-06-17 MED ORDER — OXYCODONE HCL 10 MG PO TABS
5.0000 mg | ORAL_TABLET | Freq: Four times a day (QID) | ORAL | 0 refills | Status: DC | PRN
Start: 2021-06-17 — End: 2021-06-18
  Filled 2021-06-17: qty 32, 8d supply, fill #0

## 2021-06-17 MED ORDER — DICLOFENAC SODIUM 1 % EX GEL
2.0000 g | Freq: Four times a day (QID) | CUTANEOUS | 0 refills | Status: DC
Start: 2021-06-17 — End: 2022-10-18
  Filled 2021-06-17: qty 350, fill #0
  Filled 2021-06-18: qty 400, fill #0

## 2021-06-17 MED ORDER — AMLODIPINE BESYLATE 2.5 MG PO TABS
2.5000 mg | ORAL_TABLET | Freq: Every day | ORAL | Status: DC
  Administered 2021-06-18: 2.5 mg via ORAL
  Filled 2021-06-17: qty 1

## 2021-06-17 MED ORDER — RIFAXIMIN 550 MG PO TABS
550.0000 mg | ORAL_TABLET | Freq: Two times a day (BID) | ORAL | 0 refills | Status: DC
  Filled 2021-06-17 – 2021-06-18 (×2): qty 60, 30d supply, fill #0

## 2021-06-17 MED ORDER — POLYETHYLENE GLYCOL 3350 17 G PO PACK
17.0000 g | PACK | Freq: Every day | ORAL | Status: DC
  Filled 2021-06-17: qty 1

## 2021-06-17 MED ORDER — AMLODIPINE BESYLATE 2.5 MG PO TABS
2.5000 mg | ORAL_TABLET | Freq: Every day | ORAL | 0 refills | Status: AC
  Filled 2021-06-17 – 2021-06-18 (×2): qty 30, 30d supply, fill #0

## 2021-06-17 MED ORDER — ATORVASTATIN CALCIUM 10 MG PO TABS
10.0000 mg | ORAL_TABLET | Freq: Every day | ORAL | 0 refills | Status: AC
  Filled 2021-06-17 – 2021-06-18 (×2): qty 30, 30d supply, fill #0

## 2021-06-17 MED ORDER — PANTOPRAZOLE SODIUM 40 MG PO TBEC
40.0000 mg | DELAYED_RELEASE_TABLET | Freq: Two times a day (BID) | ORAL | 0 refills | Status: AC
  Filled 2021-06-17 – 2021-06-18 (×2): qty 60, 30d supply, fill #0

## 2021-06-17 MED ORDER — ASPIRIN 81 MG PO TBEC
81.0000 mg | DELAYED_RELEASE_TABLET | Freq: Every day | ORAL | 0 refills | Status: AC
  Filled 2021-06-17 – 2021-06-18 (×2): qty 30, 30d supply, fill #0

## 2021-06-17 MED ORDER — PANTOPRAZOLE SODIUM 40 MG PO TBEC
40.0000 mg | DELAYED_RELEASE_TABLET | Freq: Two times a day (BID) | ORAL | 0 refills | Status: DC
  Filled 2021-06-17: qty 60, 30d supply, fill #0

## 2021-06-17 MED ORDER — MELATONIN 3 MG PO TABS
6.0000 mg | ORAL_TABLET | Freq: Every day | ORAL | 0 refills | Status: DC
  Filled 2021-06-17 – 2021-06-18 (×2): qty 60, 30d supply, fill #0

## 2021-06-17 MED ORDER — LACTULOSE 10 GM/15ML PO SOLN
20.0000 g | Freq: Once | ORAL | Status: AC
Start: 2021-06-17 — End: 2021-06-17
  Administered 2021-06-17: 20 g via ORAL
  Filled 2021-06-17: qty 30

## 2021-06-17 MED ORDER — TRAZODONE HCL 50 MG PO TABS
50.0000 mg | ORAL_TABLET | Freq: Every day | ORAL | 0 refills | Status: DC
  Filled 2021-06-17 – 2021-06-18 (×2): qty 30, 30d supply, fill #0

## 2021-06-17 MED ORDER — FLEET ENEMA 7-19 GM/118ML RE ENEM: 1.0000 | ENEMA | Freq: Once | RECTAL | Status: DC

## 2021-06-17 MED ORDER — TAMSULOSIN HCL 0.4 MG PO CAPS
0.4000 mg | ORAL_CAPSULE | Freq: Every day | ORAL | 0 refills | Status: DC
  Filled 2021-06-17 – 2021-06-18 (×2): qty 30, 30d supply, fill #0

## 2021-06-17 MED ORDER — METHOCARBAMOL 500 MG PO TABS
500.0000 mg | ORAL_TABLET | Freq: Four times a day (QID) | ORAL | 0 refills | Status: DC | PRN
  Filled 2021-06-17 – 2021-06-18 (×2): qty 45, 12d supply, fill #0

## 2021-06-17 MED ORDER — POLYETHYLENE GLYCOL 3350 17 GM/SCOOP PO POWD
17.0000 g | Freq: Two times a day (BID) | ORAL | 0 refills | Status: DC
  Filled 2021-06-17 – 2021-06-18 (×2): qty 238, 7d supply, fill #0

## 2021-06-17 MED ORDER — POLYETHYLENE GLYCOL 3350 17 G PO PACK
17.0000 g | PACK | Freq: Two times a day (BID) | ORAL | Status: AC
  Administered 2021-06-17 (×2): 17 g via ORAL
  Filled 2021-06-17 (×2): qty 1

## 2021-06-17 MED ORDER — METFORMIN HCL 500 MG PO TABS
ORAL_TABLET | ORAL | 0 refills | Status: AC
  Filled 2021-06-17 – 2021-06-18 (×2): qty 90, 36d supply, fill #0

## 2021-06-17 MED ORDER — LACTULOSE 10 GM/15ML PO SOLN
10.0000 g | ORAL | 0 refills | Status: DC
  Filled 2021-06-17 – 2021-06-18 (×2): qty 236, 30d supply, fill #0

## 2021-06-17 NOTE — Progress Notes (Signed)
Physical Therapy Session Note  Patient Details  Name: Dalton Miller MRN: [id number] Date of Birth: January 11, 1945  Today's Date: 06/17/2021 PT Individual Time: 0731-0826 and 2924-4628 PT Individual Time Calculation (min): 55 min and 54 min  Short Term Goals: Week 1:  PT Short Term Goal 1 (Week 1): Pt will complete bed mobility with min A consistently PT Short Term Goal 1 - Progress (Week 1): Progressing toward goal PT Short Term Goal 2 (Week 1): Pt will complete least restrictive transfer with mod A consistently PT Short Term Goal 2 - Progress (Week 1): Met PT Short Term Goal 3 (Week 1): Pt will initiate gait training PT Short Term Goal 3 - Progress (Week 1): Met PT Short Term Goal 4 (Week 1): Pt will propel w/c x 50 ft with min A PT Short Term Goal 4 - Progress (Week 1): Met Week 2:  PT Short Term Goal 1 (Week 2): STG=LTG due to LOS  Skilled Therapeutic Interventions/Progress Updates:   Treatment Session 1 Received pt semi-reclined in bed, pt agreeable to PT treatment, and reported pain 6/10 in L hip (premedicated). Session with emphasis on functional mobility/transfers, generalized strengthening and endurance, dynamic standing balance/coordination, and ambulation. Pt transferred semi-reclined<>sitting EOB with HOB elevated and use of bedrails, hooking LLE with RLE, and supervision. Pt transferred bed<>WC stand<>pivot with RW and CGA/close supervision. Pt transported to/from room in [facility] dependently for time management and energy conservation purposes. Sit<>stand with RW and supervision and pt ambulated 68f on uneven surfaces (ramp) with RW and close supervision and stood and picked up small cup from floor with RW and close supervision. Pt requested to work on arm exercises and performed the [facility] for technique: -bicep curls 2x10 bilaterally - 3lb dumbbell on RUE and 1lb on LUE -horizontal ER with red TB 1x12 and 1x10 -rows with red TB  2x10 -unrestricted L shoulder IR/ER x10 -R shoulder IR/ER with 3lb dumbbell x10  -hip abduction with red TB 2x10 -hamstring curls with red TB 2x15 Returned to room and pt agreed to sit in recliner. Sit<>stand with RW and supervision and ambulated 120fwith RW and supervision to recliner. Concluded session with pt sitting in recliner on Roho cushion with all needs within reach. Provided pt with coffee, blanket, and ice pack for L hip.   Treatment Session 2 Received pt sitting in recliner, pt agreeable to PT treatment, and reported pain 7/10 in L hip with mobility but no pain at rest (premedicated). Session with emphasis on functional mobility/transfers, generalized strengthening and endurance, and dynamic standing balance/coordination. Pt transferred sit<>stand with RW and supervision and ambulated 1235fith RW and supervision to WC. [facility]. Pt transferred on/off Nustep with RW and supervision and performed seated BLE strengthening on Nustep at workload 3 for 12 minutes for a total of 134 steps with emphasis on cardiovascular endurance and L hip ROM. Returned to room and pt transferred sit<>stand with RW and supervision and ambulated 46f11fth RW and supervision back to recliner. Concluded session with pt sitting in recliner on Roho cushion with all needs within reach.   Therapy Documentation Precautions:  Precautions Precautions: Fall Restrictions Weight Bearing Restrictions: Yes LLE Weight Bearing: Weight bearing as tolerated  Therapy/Group: Individual Therapy AnnaAlfonse Alpers DPT  06/17/2021, 6:47 AM

## 2021-06-17 NOTE — Progress Notes (Signed)
Inpatient Rehabilitation Care Coordinator Discharge Note   Patient Details  Name: Dalton Miller MRN: [id number] Date of Birth: [birth year]   Discharge location: Home  Length of Stay: 17  Discharge activity level: Sup/Min  Home/community participation: family  Patient response BL:TJQZES Literacy - How often do you need to have someone help you when you read instructions, pamphlets, or other written material from your doctor or pharmacy?: Never  Patient response PQ:ZRAQTM Isolation - How often do you feel lonely or isolated from those around you?: Never  Services provided included: SW, Pharmacy, TR, CM, RN, SLP, OT, PT, RD, MD  Financial Services:  Financial Services Utilized: Other (Comment)    Choices offered to/list presented to:    Follow-up services arranged:  [place]: [place]         Patient response to transportation need: Is the patient able to respond to transportation needs?: Yes In the past 12 months, has lack of transportation kept you from medical appointments or from getting medications?: No In the past 12 months, has lack of transportation kept you from meetings, work, or from getting things needed for daily living?: No    Comments (or additional information):  Patient/Family verbalized understanding of follow-up arrangements:  Yes  Individual responsible for coordination of the follow-up plan: Arrie Aran [phone]  Confirmed correct DME delivered: Dyanne Iha 06/17/2021    Dyanne Iha

## 2021-06-17 NOTE — Progress Notes (Signed)
Patient ID: Dalton Miller, male   DOB: June 04, 1945, 76 y.o.   MRN: [id number] Team Conference Report to Patient/Family  Team Conference discussion was reviewed with the patient and caregiver, including goals, any changes in plan of care and target discharge date.  Patient and caregiver express understanding and are in agreement.  The patient has a target discharge date of 06/18/21.  Sw met with patient and patient son was present via telephone on 6/14. Sw provided team conference updates to patient and discussed discharge information. Patient informed SW that his hospital bed has recently been delivered and he was just waiting on the rest of his DME. Sw followed up with patient [facility] and the rest of patient DME set to be delivered today.   Dyanne Iha 06/17/2021, 2:08 PM

## 2021-06-17 NOTE — Progress Notes (Signed)
Patient ID: Dalton Miller, male   DOB: April 24, 1945, 76 y.o.   MRN: [id number]  Sw received notification that patient remaining DME will be delivered today to patients home.

## 2021-06-17 NOTE — Progress Notes (Signed)
Pt refused his enema, states he would rather go on his own without enema.  Dalton Miller Dalton Miller

## 2021-06-17 NOTE — Progress Notes (Signed)
PROGRESS NOTE   Subjective/Complaints: No new complaints this morning Denies any more pain in feet Only pain is in left thigh and it feels like soreness Voiding well  ROS: No fever,chills, SOB, HA, CP. No Abdominal pain or nausea., +peripheral neuropathy- resolved  Objective:   No results found. No results for input(s): "WBC", "HGB", "HCT", "PLT" in the last 72 hours.    Recent Labs    06/15/21 1212  NA 134*  K 4.6  CL 105  CO2 24  GLUCOSE 150*  BUN 24*  CREATININE 1.17  CALCIUM 8.9      Intake/Output Summary (Last 24 hours) at 06/17/2021 1054 Last data filed at 06/17/2021 0837 Gross per 24 hour  Intake 395 ml  Output 1550 ml  Net -1155 ml        Physical Exam: Vital Signs Blood pressure (!) 117/58, pulse 84, temperature 98.4 F (36.9 C), temperature source Oral, resp. rate 17, height 5\' 7"  (1.702 m), weight 71.6 kg, SpO2 97 %.  Constitutional: No distress . Vital signs reviewed. Sitting in bed HEENT: NCAT,  oral membranes moist Neck: supple Cardiovascular: RRR without murmur. Respiratory/Chest: CTA Bilaterally without wheezes or rales. Normal effort    GI/Abdomen: BS +, soft,  non-tender, non-distended Ext: no clubbing, cyanosis, or edema Psych: very pleasant Musculoskeletal:        General: Swelling present left hip.     Cervical back: Neck supple. No tenderness.     Comments: RUE 5/5 LUE 4/5 due to previous stroke RLE 5/5 LLE- HF 2/5; 4/5 in KE/DF and PF  Functional mobility: CG RW sit to stand Skin:    General: Skin is warm and dry.     Comments: Multiple healed skin tears on bilateral forearms improving.  +redness of big toes   Left hip wound dressed  Neurological:     Mental Status: He is alert and oriented to person, place, and time.  Follows commands, answers questions    Comments: Mild left facial weakness with LUE weakness ongoing. LLE limited due to pain. Fair insight and  awareness      Assessment/Plan: 1. Functional deficits which require 3+ hours per day of interdisciplinary therapy in a comprehensive inpatient rehab setting. Physiatrist is providing close team supervision and 24 hour management of active medical problems listed below. Physiatrist and rehab team continue to assess barriers to discharge/monitor patient progress toward functional and medical goals  Care Tool:  Bathing    Body parts bathed by patient: Right arm, Left arm, Chest, Abdomen, Front perineal area, Right upper leg, Left upper leg, Right lower leg, Left lower leg, Face, Buttocks   Body parts bathed by helper: Buttocks     Bathing assist Assist Level: Set up assist     Upper Body Dressing/Undressing Upper body dressing   What is the patient wearing?: Pull over shirt    Upper body assist Assist Level: Set up assist    Lower Body Dressing/Undressing Lower body dressing      What is the patient wearing?: Pants     Lower body assist Assist for lower body dressing: Supervision/Verbal cueing     Toileting Toileting    Toileting assist Assist  for toileting: Contact Guard/Touching assist     Transfers Chair/bed transfer  Transfers assist     Chair/bed transfer assist level: Supervision/Verbal cueing     Locomotion Ambulation   Ambulation assist   Ambulation activity did not occur: Safety/medical concerns  Assist level: Supervision/Verbal cueing Assistive device: Walker-rolling Max distance: 12ft   Walk 10 feet activity   Assist  Walk 10 feet activity did not occur: Safety/medical concerns  Assist level: Supervision/Verbal cueing Assistive device: Walker-rolling   Walk 50 feet activity   Assist Walk 50 feet with 2 turns activity did not occur: Safety/medical concerns  Assist level: Supervision/Verbal cueing Assistive device: Walker-rolling    Walk 150 feet activity   Assist Walk 150 feet activity did not occur: Safety/medical  concerns  Assist level: Supervision/Verbal cueing Assistive device: Walker-rolling    Walk 10 feet on uneven surface  activity   Assist Walk 10 feet on uneven surfaces activity did not occur: Safety/medical concerns   Assist level: Supervision/Verbal cueing Assistive device: Walker-rolling   Wheelchair     Assist Is the patient using a wheelchair?: Yes Type of Wheelchair: Manual    Wheelchair assist level: Minimal Assistance - Patient > 75% Max wheelchair distance: 150ft    Wheelchair 50 feet with 2 turns activity    Assist        Assist Level: Minimal Assistance - Patient > 75%   Wheelchair 150 feet activity     Assist      Assist Level: Maximal Assistance - Patient 25 - 49%   Blood pressure (!) 117/58, pulse 84, temperature 98.4 F (36.9 C), temperature source Oral, resp. rate 17, height 5\' 7"  (1.702 m), weight 71.6 kg, SpO2 97 %.    Medical Problem List and Plan: 1. Functional deficits secondary to L hip fx in setting of previous L hemiparesis             -patient may  shower             -ELOS/Goals: 14-16 days mod I to supervision  -Continue CIR therapies including PT, OT   -Worked on car/truck transfers today with PT   2.  Impaired mobility and ADLs: continue Eliquis 2.5mg  BID because was refusing Lovenox shots. Discussed with patient.  3. Left hip pain: continue Tylenol and/or oxycodone prn. Increase Gabapentin to 300mg  TID. Discussed scheduling Tylenol but he does not find tylenol helpful. Continue ice.  4. Insomnia: continue Trazodone 50mg  HS 5. Neuropsych: This patient is capable of making decisions on his own behalf. 6. Skin/Wound Care: Surgical dressing to stay in place for 2 weeks or till f/u with ortho             --routine pressure relief measures.  7. Hypoalbuminemia: d/c Juven given hyperglycemia. Encourage high protein foods.   8. Cirrhosis of the liver: Thrombocytopenia noted at admission 93-->72-->88. Monitor for signs of  bleeding.              --discussed that Ammonia is stable. Continue lactulose             --continue Rifaximin  9. Hyponatremia: Multifactorial. Will recheck in am. -Stable at 135 on 06/08/21, follow 10. T2DM: Hgb A1c- 6.4 and diet controlled.  --Poorly controlled post op likely due to stress and immobility.              --decrease CBGS checks to [facility] and use SSI for elevated BS             --change diet  to Carb Modified.   -add metformin 250mg  with lunch.   CBG (last 3)  Recent Labs    06/15/21 0602 06/16/21 0546 06/17/21 0555  GLUCAP 151* 123* 137*     -6/13 glucose well controlled overall, continue metformin 11. Constipation: Continue Colace 100mg  BID and lactulose 12. H/o melanoma/Lung CA w/mets/COPD: Encourage tobacco cessation.             --respiratory status stable. Not on MDI at home.  13. H/o GIB: continue PPI bid.  14. Insomnia: On Trazodone and melatonin.  15. HTN: Monitor BP TID--decrease Norvasc to 2.5mg  daily. Discontinue Flomax 0.4mg  HS -6/11 BP well controlled, flomax restarted for urinary retention -6/13 BP continues to be well controlled 16. Low grade fever: Encourage IS.  17. Urinary retention: Will monitor voiding with PVR checks. discontinue Flomax 0.4mg  HS             --toilet every 4 hours.   -6/10 flomax restarted. Pvr's <100cc, he's continent 18. L-MCA stroke: On ASA for stroke prophylaxis--resume.  19. Constipation: Resolved             --Continue Lactulose for OIC.   -6/13 PRN miralax given today, monitor if he has results tomorrow 20. Sinusitis: recommended bee pollen and local honey outpatient 21. Redness and pain in bilateral big toes: voltaren gel ordered  LOS: 16 days A FACE TO FACE EVALUATION WAS PERFORMED  Martha Clan P Nakia Koble 06/17/2021, 10:54 AM

## 2021-06-17 NOTE — Progress Notes (Signed)
Occupational Therapy Session Note  Patient Details  Name: PINCHAS REITHER MRN: [id number] Date of Birth: Nov 28, 1945  Today's Date: 06/17/2021 OT Individual Time: 1300-1415 OT Individual Time Calculation (min): 75 min    Short Term Goals: Week 2:  OT Short Term Goal 1 (Week 2): STG = LTG 2/2 ELOS  Skilled Therapeutic Interventions/Progress Updates:    S: Pt agreeable to take a shower this session.   Pt participated in skilled OT session while participating in bathing activity while seated in walk-in shower. Therapist facilitated patient to perform all self care activities (grooming, bathing, dressing, functional transfers) at his highest level of performance while demonstrating increased safety awareness and judgement. Pt was able to complete all self care tasks successully while verbalizing appropriate needs. No cueing was required to remind him of safety techniques. Education provided on how to modify activities such as bending down to complete lower body dressing with use of reacher and/or without and to remain safe doing so.    P: plan to discharge home tomorrow.   Therapy Documentation Precautions:  Precautions Precautions: Fall Restrictions Weight Bearing Restrictions: No LLE Weight Bearing: Weight bearing as tolerated PAIN: no pain reported   Therapy/Group: Individual Therapy Ailene Ravel, OTR/L,CBIS  Supplemental OT - [facility]  06/17/2021, 8:43 PM

## 2021-06-18 ENCOUNTER — Other Ambulatory Visit (HOSPITAL_COMMUNITY): Payer: Self-pay

## 2021-06-18 LAB — GLUCOSE, CAPILLARY: Glucose-Capillary: 134 mg/dL — ABNORMAL HIGH (ref 70–99)

## 2021-06-18 MED ORDER — OXYCODONE HCL 10 MG PO TABS
5.0000 mg | ORAL_TABLET | Freq: Four times a day (QID) | ORAL | 0 refills | Status: DC | PRN
Start: 2021-06-18 — End: 2022-10-18
  Filled 2021-06-18: qty 32, 8d supply, fill #0

## 2021-06-18 MED ORDER — GABAPENTIN 100 MG PO CAPS
100.0000 mg | ORAL_CAPSULE | Freq: Every day | ORAL | Status: DC
Start: 2021-06-18 — End: 2021-06-18

## 2021-06-18 MED ORDER — GABAPENTIN 100 MG PO CAPS
100.0000 mg | ORAL_CAPSULE | Freq: Every day | ORAL | 0 refills | Status: DC
  Filled 2021-06-18: qty 30, 30d supply, fill #0

## 2021-06-18 NOTE — Discharge Summary (Signed)
Physician Discharge Summary  Patient ID: Dalton Miller MRN: [id number] DOB/AGE: [birth year] 76 y.o.  Admit date: 06/01/2021 Discharge date: 06/18/2021  Discharge Diagnoses:  Principal Problem:   Hip fracture requiring operative repair [facility]) Active Problems:   Diabetes mellitus ([place])   Acute blood loss anemia   Renal azotemia   Discharged Condition: stable  Significant Diagnostic Studies:   Labs:  Basic Metabolic Panel:    Latest Ref Rng & Units 06/15/2021   12:12 PM 06/08/2021    5:42 AM 06/02/2021    5:11 AM  BMP  Glucose 70 - 99 mg/dL 150  171  204   BUN 8 - 23 mg/dL 24  21  23    Creatinine 0.61 - 1.24 mg/dL 1.17  1.12  1.05   Sodium 135 - 145 mmol/L 134  135  133   Potassium 3.5 - 5.1 mmol/L 4.6  4.1  4.0   Chloride 98 - 111 mmol/L 105  104  102   CO2 22 - 32 mmol/L 24  24  23    Calcium 8.9 - 10.3 mg/dL 8.9  8.6  8.4      CBC:    Latest Ref Rng & Units 06/14/2021    6:45 AM 06/07/2021    6:27 AM 06/02/2021    5:11 AM  CBC  WBC 4.0 - 10.5 K/uL 4.4  4.5  4.5   Hemoglobin 13.0 - 17.0 g/dL 7.6  7.4  7.6   Hematocrit 39.0 - 52.0 % 24.1  22.9  22.5   Platelets 150 - 400 K/uL 152  153  98      CBG: Recent Labs  Lab 06/14/21 0620 06/15/21 0602 06/16/21 0546 06/17/21 0555 06/18/21 0603  GLUCAP 130* 151* 123* 137* 134*    Brief HPI:   Dalton Miller is a 76 y.o. male with history of melanoma of lung with metastases, T2DM, COPD, right MCA infarct, GI bleed, hepatic encephalopathy who was admitted on 05/27/2021 after a fall with inability to stand.  He was found to have left IT femur fracture and underwent ORIF left femur by Dr. Doreatha Martin.  Postop was made WBAT and Lovenox added for DVT prophylaxis with recommendations to transfer to [facility] x30 days at discharge.  Hospital course significant for poorly controlled blood sugars, acute blood loss anemia with drop in hemoglobin to 6.8, hyponatremia as well as thrombocytopenia.  Foley was also placed due to urinary retention and removed  prior to discharge.  PT and OT were working with patient and he was noted to be limited by pain, weakness and was working on pre-gait activity.  CIR was recommended due to functional decline.   Hospital Course: Dalton Miller was admitted to rehab 06/01/2021 for inpatient therapies to consist of PT and OT at least three hours five days a week. Past admission physiatrist, therapy team and rehab RN have worked together to provide customized collaborative inpatient rehab. Blood pressures were monitored on TID basis and has been stable.  He is diabetes has been monitored with ac/hs CBG checks and SSI was use prn for tighter BS control.  Carb modified restrictions were added to diet with improvement in blood sugars.  He was noted to have low albumin levels therefore Juven was added for supplementation.  CBC was monitored with serial check and acute blood loss anemia stable and thrombocytopenia has resolved.    His voiding function has improved and bladder scan showed low volumes.  Flomax was added to help improve voiding function.  Low-dose  ASA was resumed due to history of stroke.   Low-dose Eliquis added for DVT prophylaxis.  Check of electrolytes showed mild hyponatremia to be stable.  His p.o. intake has been good.  Constipation has been managed with augmentation of bowel regimen as well as education on importance of compliance with lactulose.  He was premedicated with oxycodone prior to a.m. and p.m. therapies and this has improved his activity tolerance.  Oxycodone was decreased to 5-10 and he was educated on weaning narcotics after discharge.  He has made steady gains during his stay and currently requires min assist.   Rehab course: During patient's stay in rehab weekly team conferences were held to monitor patient's progress, set goals and discuss barriers to discharge. At admission, patient required max assist with basic ADL task and total assist with mobility. He  has had improvement in activity  tolerance, balance, postural control as well as ability to compensate for deficits.  He is able to complete ADL tasks with supervision.  He requires supervision with verbal cues for transfers and to ambulate 180 feet with rolling walker.  Supervision is recommended for safety due to poor foot clearance as well as flexed posture with impaired gait pattern. Patient and family have been educated on home exercise plan.  Discharge disposition: 01-Home or Self Care  Diet: Carb modified.   Special Instructions: 1.Recommend repeat CBC/BMET in 1-2 weeks to monitor H/H and sodium levels. 2. No driving or strenuous activity.   Allergies as of 06/18/2021   No Known Allergies      Medication List     STOP taking these medications    docusate sodium 100 MG capsule Commonly known as: COLACE   insulin aspart 100 UNIT/ML injection Commonly known as: novoLOG       TAKE these medications    acetaminophen 325 MG tablet Commonly known as: TYLENOL Take 2 tablets (650 mg total) by mouth every 6 (six) hours as needed for mild pain.   amLODipine 2.5 MG tablet Commonly known as: NORVASC Take 1 tablet (2.5 mg total) by mouth daily. What changed:  medication strength how much to take   apixaban 2.5 MG Tabs tablet Commonly known as: Eliquis Take 1 tablet (2.5 mg total) by mouth 2 (two) times daily.   aspirin EC 81 MG tablet Take 1 tablet (81 mg total) by mouth daily. Swallow whole.   atorvastatin 10 MG tablet Commonly known as: LIPITOR Take 1 tablet (10 mg total) by mouth daily.   diclofenac Sodium 1 % Gel Commonly known as: VOLTAREN Apply 2 g topically 4 (four) times daily.   gabapentin 300 MG capsule Commonly known as: NEURONTIN Take 1 capsule (300 mg total) by mouth 3 (three) times daily.   gabapentin 100 MG capsule Commonly known as: Neurontin Take 1 capsule (100 mg total) by mouth at bedtime.   lactulose 10 GM/15ML solution Commonly known as: CHRONULAC Take 15 mLs (10 g  total) by mouth every other day.   melatonin 3 MG Tabs tablet Take 2 tablets (6 mg total) by mouth at bedtime.   metFORMIN 500 MG tablet Commonly known as: GLUCOPHAGE Take 2 tablets (1,000 mg total) by mouth daily with breakfast AND 0.5 tablets (250 mg total) daily with lunch.   methocarbamol 500 MG tablet Commonly known as: ROBAXIN Take 1 tablet (500 mg total) by mouth every 6 (six) hours as needed for muscle spasms.   multivitamin with minerals Tabs tablet Take 1 tablet by mouth daily.   Oxycodone HCl 10  MG Tabs--Rx# 32 pills.  Take 0.5-1 tablets (5-10 mg total) by mouth every 6 (six) hours as needed. What changed:  medication strength when to take this reasons to take this   pantoprazole 40 MG tablet Commonly known as: PROTONIX Take 1 tablet (40 mg total) by mouth 2 (two) times daily.   polyethylene glycol powder 17 GM/SCOOP powder Commonly known as: GLYCOLAX/MIRALAX Take 17 g by mouth 2 (two) times daily.   Propylene Glycol 0.6 % Soln Place 1 drop into both eyes 4 (four) times daily.   rifaximin 550 MG Tabs tablet Commonly known as: XIFAXAN Take 1 tablet (550 mg total) by mouth 2 (two) times daily.   tamsulosin 0.4 MG Caps capsule Commonly known as: FLOMAX Take 1 capsule (0.4 mg total) by mouth daily after breakfast.   traZODone 50 MG tablet Commonly known as: DESYREL Take 1 tablet (50 mg total) by mouth at bedtime.        Follow-up Information     [facility] Follow up.   Why: Call in 1-2 days for post hospital follow up Contact information: [place] [address] [phone]         Izora Ribas, MD Follow up.   Specialty: Physical Medicine and Rehabilitation Why: 08/17 4:48 for [employer] application for neuropathy Contact information: [address] [place] [address] [phone]         Shona Needles, MD Follow up.   Specialty: Orthopedic Surgery Why: Call in 1-2 days  for post hospital follow up Contact information: [place] [address] [phone]                 Signed: Bary Leriche 06/21/2021, 4:43 PM

## 2021-06-18 NOTE — Progress Notes (Signed)
PROGRESS NOTE   Subjective/Complaints: D/c home today Last night he experienced burning in both heels and big toes, discussed adding additional Gabapentin 100mg  HS and he is agreeable  ROS: No fever,chills, SOB, HA, CP. No Abdominal pain or nausea., +peripheral neuropathy- worst at night  Objective:   No results found. No results for input(s): "WBC", "HGB", "HCT", "PLT" in the last 72 hours.    Recent Labs    06/15/21 1212  NA 134*  K 4.6  CL 105  CO2 24  GLUCOSE 150*  BUN 24*  CREATININE 1.17  CALCIUM 8.9      Intake/Output Summary (Last 24 hours) at 06/18/2021 1008 Last data filed at 06/18/2021 0813 Gross per 24 hour  Intake 440 ml  Output 850 ml  Net -410 ml        Physical Exam: Vital Signs Blood pressure 109/75, pulse 89, temperature 97.8 F (36.6 C), resp. rate 18, height 5\' 7"  (1.702 m), weight 71.6 kg, SpO2 94 %.  Constitutional: No distress . Vital signs reviewed. Sitting in bed HEENT: NCAT,  oral membranes moist Neck: supple Cardiovascular: RRR without murmur. Respiratory/Chest: CTA Bilaterally without wheezes or rales. Normal effort    GI/Abdomen: BS +, soft,  non-tender, non-distended Ext: no clubbing, cyanosis, or edema Psych: very pleasant Musculoskeletal:        General: Swelling present left hip.     Cervical back: Neck supple. No tenderness.     Comments: RUE 5/5 LUE 4/5 due to previous stroke RLE 5/5 LLE- HF 2/5; 4/5 in KE/DF and PF  Functional mobility: CG RW sit to stand Supervision with ADLs Skin:    General: Skin is warm and dry.     Comments: Multiple healed skin tears on bilateral forearms improving.  +redness of big toes   Left hip wound dressed  Neurological:     Mental Status: He is alert and oriented to person, place, and time.  Follows commands, answers questions    Comments: Mild left facial weakness with LUE weakness ongoing. LLE limited due to pain. Fair insight  and awareness      Assessment/Plan: 1. Functional deficits which require 3+ hours per day of interdisciplinary therapy in a comprehensive inpatient rehab setting. Physiatrist is providing close team supervision and 24 hour management of active medical problems listed below. Physiatrist and rehab team continue to assess barriers to discharge/monitor patient progress toward functional and medical goals  Care Tool:  Bathing    Body parts bathed by patient: Right arm, Left arm, Abdomen, Chest, Front perineal area, Buttocks, Right upper leg, Left upper leg, Right lower leg, Left lower leg, Face   Body parts bathed by helper: Buttocks     Bathing assist Assist Level: Independent with assistive device     Upper Body Dressing/Undressing Upper body dressing   What is the patient wearing?: Pull over shirt    Upper body assist Assist Level: Independent with assistive device Assistive Device Comment: seated on EOB  Lower Body Dressing/Undressing Lower body dressing      What is the patient wearing?: Pants     Lower body assist Assist for lower body dressing: Supervision/Verbal cueing     Toileting  Toileting    Toileting assist Assist for toileting: Supervision/Verbal cueing     Transfers Chair/bed transfer  Transfers assist     Chair/bed transfer assist level: Supervision/Verbal cueing     Locomotion Ambulation   Ambulation assist   Ambulation activity did not occur: Safety/medical concerns  Assist level: Supervision/Verbal cueing Assistive device: Walker-rolling Max distance: 155ft   Walk 10 feet activity   Assist  Walk 10 feet activity did not occur: Safety/medical concerns  Assist level: Supervision/Verbal cueing Assistive device: Walker-rolling   Walk 50 feet activity   Assist Walk 50 feet with 2 turns activity did not occur: Safety/medical concerns  Assist level: Supervision/Verbal cueing Assistive device: Walker-rolling    Walk 150 feet  activity   Assist Walk 150 feet activity did not occur: Safety/medical concerns  Assist level: Supervision/Verbal cueing Assistive device: Walker-rolling    Walk 10 feet on uneven surface  activity   Assist Walk 10 feet on uneven surfaces activity did not occur: Safety/medical concerns   Assist level: Supervision/Verbal cueing Assistive device: Walker-rolling   Wheelchair     Assist Is the patient using a wheelchair?: Yes Type of Wheelchair: Manual    Wheelchair assist level: Minimal Assistance - Patient > 75% Max wheelchair distance: 185ft    Wheelchair 50 feet with 2 turns activity    Assist        Assist Level: Minimal Assistance - Patient > 75%   Wheelchair 150 feet activity     Assist      Assist Level: Maximal Assistance - Patient 25 - 49%   Blood pressure 109/75, pulse 89, temperature 97.8 F (36.6 C), resp. rate 18, height 5\' 7"  (1.702 m), weight 71.6 kg, SpO2 94 %.    Medical Problem List and Plan: 1. Functional deficits secondary to L hip fx in setting of previous L hemiparesis             -patient may  shower             -ELOS/Goals: 14-16 days mod I to supervision  Continue CIR therapies including PT, OT  2.  Impaired mobility and ADLs: continue Eliquis 2.5mg  BID because was refusing Lovenox shots. Discussed with patient.  3. Left hip pain: continue Tylenol and/or oxycodone prn. Increase Gabapentin to 300mg  TID. Add additional 100mg  Gabapentin HS. Discussed scheduling Tylenol but he does not find tylenol helpful. Continue ice.  4. Insomnia: continue Trazodone 50mg  HS 5. Neuropsych: This patient is capable of making decisions on his own behalf. 6. Skin/Wound Care: Surgical dressing to stay in place for 2 weeks or till f/u with ortho             --routine pressure relief measures.  7. Hypoalbuminemia: d/c Juven given hyperglycemia. Encourage high protein foods.   8. Cirrhosis of the liver: Thrombocytopenia noted at admission  93-->72-->88. Monitor for signs of bleeding.              --discussed that Ammonia is stable. Continue lactulose             --continue Rifaximin  9. Hyponatremia: Multifactorial. Will recheck in am. -Stable at 135 on 06/08/21, follow 10. T2DM: Hgb A1c- 6.4 and diet controlled.  --Poorly controlled post op likely due to stress and immobility.              --decrease CBGS checks to [facility] and use SSI for elevated BS             --change diet to Carb Modified.   -  continue metformin 250mg  with lunch.   CBG (last 3)  Recent Labs    06/16/21 0546 06/17/21 0555 06/18/21 0603  GLUCAP 123* 137* 134*     -6/13 glucose well controlled overall, continue metformin 11. Constipation: Continue Colace 100mg  BID and lactulose 12. H/o melanoma/Lung CA w/mets/COPD: Encourage tobacco cessation.             --respiratory status stable. Not on MDI at home.  13. H/o GIB: continue PPI bid.  14. Insomnia: On Trazodone and melatonin.  15. HTN: Monitor BP TID--decrease Norvasc to 2.5mg  daily. Discontinue Flomax 0.4mg  HS -6/11 BP well controlled, flomax restarted for urinary retention -6/13 BP continues to be well controlled 16. Low grade fever: Encourage IS.  17. Urinary retention: Will monitor voiding with PVR checks. discontinue Flomax 0.4mg  HS             --toilet every 4 hours.   -6/10 flomax restarted. Pvr's <100cc, he's continent 18. L-MCA stroke: On ASA for stroke prophylaxis--resume.  19. Constipation: Resolved             --Continue Lactulose for OIC.   -6/13 PRN miralax given today, monitor if he has results tomorrow 20. Sinusitis: recommended bee pollen and local honey outpatient 21. Redness and pain in bilateral big toes: voltaren gel ordered   >30 minutes spent in discharge of patient including review of medications and follow-up appointments, physical examination, and in answering all patient's questions   LOS: 17 days A FACE TO [place] 06/18/2021,  10:08 AM

## 2021-06-18 NOTE — Progress Notes (Signed)
Inpatient Rehabilitation Discharge Medication Review by a Pharmacist  A complete drug regimen review was completed for this patient to identify any potential clinically significant medication issues.  High Risk Drug Classes Is patient taking? Indication by Medication  Antipsychotic No   Anticoagulant Yes Apixaban  x 1 month - VTE prophylaxis  Antibiotic No   Opioid Yes Oxycodone - pain  Antiplatelet Yes Aspirin 81 mg -   Hypoglycemics/insulin Yes Metformin - glucose control  Vasoactive Medication Yes Amlodipine - blood pressure  Chemotherapy No   Other Yes Atorvastatin - hyperlipidemia Gabapentin - neuropathic pain Melatonin and Trazodone - sleep Rifamixin, lactulose - cirrhosis Pantoprazole - GI prophylaxis Miralax - laxative Tamsulosin - urinary retention MVI - supplement PRNs: Acetaminophen - mild pain Methocarbamol - muscle spasms     Type of Medication Issue Identified Description of Issue Recommendation(s)  Drug Interaction(s) (clinically significant)     Duplicate Therapy     Allergy     No Medication Administration End Date     Incorrect Dose     Additional Drug Therapy Needed     Significant med changes from prior encounter (inform family/care partners about these prior to discharge).    Other       Clinically significant medication issues were identified that warrant physician communication and completion of prescribed/recommended actions by midnight of the next day:  No  Pharmacist comments:   Time spent performing this drug regimen review (minutes):  20   Arty Baumgartner, [place] 06/18/2021 12:10 PM

## 2021-06-18 NOTE — Progress Notes (Signed)
Patient discharged to home. Discharge information reviewed by PA at bedside with family present. Education provided to patient regarding Lovenox injection. Patient and family verbalize understanding. Deny further questions at this time. Patient escorted to personal vehicle with all personal belongings. Surgical site assessed prior to discharge with NT - [place] present. Incision will appearing without erythema or drainage.

## 2021-06-21 DIAGNOSIS — R7989 Other specified abnormal findings of blood chemistry: Secondary | ICD-10-CM

## 2021-06-21 DIAGNOSIS — D62 Acute posthemorrhagic anemia: Secondary | ICD-10-CM

## 2021-08-19 ENCOUNTER — Encounter: Payer: Medicare PPO | Attending: Physical Medicine and Rehabilitation | Admitting: Physical Medicine and Rehabilitation

## 2021-09-21 NOTE — Progress Notes (Deleted)
NEUROLOGY CONSULTATION NOTE  Dalton Miller MRN: [id number] DOB: January 31, 1945  Referring provider: Delorise Shiner, MD Primary care provider: [facility]  Reason for consult:  stroke  Assessment/Plan:   Right MCA infarct with right MCA stenosis in setting of sepsis Hypertension Type 2 diabetes mellitus Hyperlipidemia Tobacco abuse/cigarette smoker   Subjective:  Dalton Miller is a 76 year old male with HTN, DM II, HLD, tobacco abuse, thrombocytopenia and history of melanoma mets to lung and urothelia carcinoma/RCC s/p nephrectomy presents for stroke.  History supplemented by hospital records.  Admitted to [facility] in December for septic shock.  With altered mental status, falls, abdominal pain with black stool and coffee-ground emesis, disconjugate gaze and found unresponsive.  Was hypoxic, hyperthermic, hypotensive and jaundiced.  Found to be COVID positive.  Intubated for airway protection.  Required 2 unit PRBC transfusion.  Found to have esophageal varices and underwent banding.  CT head showed scattered infarcts in the right MCA territory.  Follow up MRI of brain confirmed scattered cortical and subcortical infarcts in the right MCA distribution.  MRA of head showed 70% stenosis of right M1 segment.  Carotid ultrasound showed 40-59% left ICA stenosis and 60-79% left ICA stenosis.  Echo showed EF 50-55% with grade 1 diastolic dysfunction and no atrial level shunt.  LDL was 71 and Hgb A1c 7.6.  Was on ASA 81mg  prior to admission but due to GI bleed, taken off antithrombotic therapy.  Ammonia and LFTs were elevated, concerning for cirrhosis.  AKI with Cr 2.47-2.64.  Leukocytosis with WBC 26.6-27.4.  Subsequently treated and stabilized.  Restarted on ASA.  Discharged to inpatient rehab until 01/12/2021.       PAST MEDICAL HISTORY: Past Medical History:  Diagnosis Date   Acquired renal cyst of right kidney    Arthritis    Cancer ([place])    renal cell    Cirrhosis ([place])     Erectile dysfunction    Heart murmur    CHILDHOOD   Hematuria    left ureteral bleeding   History of cellulitis    2012 left wrist   History of kidney stones    Hyperlipidemia    Hypertension    Metastatic melanoma to lung, left ([place])    Nephrolithiasis    bilateral non-obstructive per ct 07-02-2015   Type 2 diabetes mellitus ([place])    Wears glasses     PAST SURGICAL HISTORY: Past Surgical History:  Procedure Laterality Date   CARPAL TUNNEL RELEASE Right 1990  approx   COLONOSCOPY  03/04/2010   CYSTOSCOPY W/ RETROGRADES Left 10/28/2015   Procedure: CYSTOSCOPY WITH LEFT RETROGRADE PYELOGRAM;  Surgeon: Cleon Gustin, MD;  Location: [facility];  Service: Urology;  Laterality: Left;   CYSTOSCOPY W/ URETERAL STENT PLACEMENT Left 10/28/2015   Procedure: CYSTOSCOPY WITH LEFT URETERAL STENT EXCHANGE;  Surgeon: Cleon Gustin, MD;  Location: [facility];  Service: Urology;  Laterality: Left;   CYSTOSCOPY WITH RETROGRADE PYELOGRAM, URETEROSCOPY AND STENT PLACEMENT Bilateral 09/17/2015   Procedure: CYSTOSCOPY WITH BILATERAL RETROGRADE PYELOGRAM, LEFT URETEROSCOPY WITH URETERAL BIOPSY AND  STENT PLACEMENT;  Surgeon: Irine Seal, MD;  Location: [facility];  Service: Urology;  Laterality: Bilateral;   ESOPHAGEAL BANDING  12/30/2020   Procedure: ESOPHAGEAL BANDING;  Surgeon: Ladene Artist, MD;  Location: [facility];  Service: Endoscopy;;   ESOPHAGOGASTRODUODENOSCOPY (EGD) WITH PROPOFOL N/A 12/30/2020   Procedure: ESOPHAGOGASTRODUODENOSCOPY (EGD) WITH PROPOFOL;  Surgeon: Ladene Artist, MD;  Location: [place];  Service:  Endoscopy;  Laterality: N/A;   HOLMIUM LASER APPLICATION Left 93/26/7124   Procedure: HOLMIUM LASER APPLICATION;  Surgeon: Irine Seal, MD;  Location: [facility];  Service: Urology;  Laterality: Left;   INTRAMEDULLARY (IM) NAIL INTERTROCHANTERIC Left 05/28/2021   Procedure: INTRAMEDULLARY (IM) NAIL INTERTROCHANTRIC;  Surgeon: Shona Needles, MD;  Location: [place];  Service: Orthopedics;  Laterality: Left;   KNEE ARTHROSCOPY Right 2008   MELANOMA EXCISION  2018   Left arm   right total knee replacement  Right    2009   ROBOT ASSITED LAPAROSCOPIC NEPHROURETERECTOMY Left 12/04/2015   Procedure: XI ROBOT ASSITED LAPAROSCOPIC NEPHROURETERECTOMY;  Surgeon: Cleon Gustin, MD;  Location: [facility];  Service: Urology;  Laterality: Left;   ROTATOR CUFF REPAIR Right    SHOULDER ARTHROSCOPY Right 12/17/2020   Procedure: Right shoulder arthroscopy, debridement, subacromial decompression, distal clavicle resection;  Surgeon: Justice Britain, MD;  Location: [facility];  Service: Orthopedics;  Laterality: Right;  48min   STONE EXTRACTION WITH BASKET Left 09/17/2015   Procedure: STONE EXTRACTION WITH BASKET;  Surgeon: Irine Seal, MD;  Location: [facility];  Service: Urology;  Laterality: Left;   TOTAL KNEE ARTHROPLASTY Right 2009   URETEROSCOPY  10/28/2015   Procedure: DIAGNOSTIC LEFT URETEROSCOPY;  Surgeon: Cleon Gustin, MD;  Location: [facility];  Service: Urology;;   WISDOM TOOTH EXTRACTION      MEDICATIONS: Current Outpatient Medications on File Prior to Visit  Medication Sig Dispense Refill   acetaminophen (TYLENOL) 325 MG tablet Take 2 tablets (650 mg total) by mouth every 6 (six) hours as needed for mild pain.     amLODipine (NORVASC) 2.5 MG tablet Take 1 tablet (2.5 mg total) by mouth daily. 30 tablet 0   apixaban (ELIQUIS) 2.5 MG TABS tablet Take 1 tablet (2.5 mg total) by mouth 2 (two) times daily. 60 tablet 0   aspirin EC 81 MG tablet Take 1 tablet (81 mg total) by mouth daily. Swallow whole. 30 tablet 0   atorvastatin (LIPITOR) 10 MG tablet Take 1 tablet (10 mg total) by mouth daily. 30 tablet 0   cyclobenzaprine (FLEXERIL) 10 MG tablet TAKE 1 TABLET BY MOUTH THREE TIMES A DAY AS NEEDED FOR MUSCLE SPASMS     diclofenac Sodium (VOLTAREN) 1 % GEL Apply 2 g topically 4 (four) times daily. 350 g 0   eltrombopag  (PROMACTA) 25 MG tablet Take 2 tablets every day by oral route.     eltrombopag (PROMACTA) 50 MG tablet Take 1 tablet every day by oral route.     gabapentin (NEURONTIN) 100 MG capsule Take 1 capsule (100 mg total) by mouth at bedtime. 30 capsule 0   gabapentin (NEURONTIN) 300 MG capsule Take 1 capsule (300 mg total) by mouth 3 (three) times daily. 90 capsule 0   lactulose (CHRONULAC) 10 GM/15ML solution Take 15 mLs (10 g total) by mouth every other day. 236 mL 0   Lidocaine 1.8 % PTCH APPLY 1 PATCH BY TOPICAL ROUTE ONCE DAILY (MAY WEAR UP TO 12HOURS.)     melatonin 3 MG TABS tablet Take 2 tablets (6 mg total) by mouth at bedtime. 60 tablet 0   metFORMIN (GLUCOPHAGE) 500 MG tablet Take 2 tablets (1,000 mg total) by mouth daily with breakfast AND 0.5 tablets (250 mg total) daily with lunch. 90 tablet 0   methocarbamol (ROBAXIN) 500 MG tablet Take 1 tablet (500 mg total) by mouth every 6 (six) hours as needed for muscle spasms. 45 tablet  0   Multiple Vitamin (MULTIVITAMIN WITH MINERALS) TABS tablet Take 1 tablet by mouth daily. (Patient not taking: Reported on 05/27/2021)     naproxen (NAPROSYN) 500 MG tablet Take 1 tablet by mouth 2 (two) times daily with a meal.     ondansetron (ZOFRAN) 4 MG tablet TAKE 1 TABLET BY MOUTH EVERY 8 HOURS AS NEEDED FOR NAUSEA AND VOMITING     oxyCODONE (OXY IR/ROXICODONE) 5 MG immediate release tablet 1 q 6 hrs prn pain     Oxycodone HCl 10 MG TABS Take 0.5-1 tablets (5-10 mg total) by mouth every 6 (six) hours as needed. 32 tablet 0   oxyCODONE-acetaminophen (PERCOCET/ROXICET) 5-325 MG tablet TAKE 1 TABLET BY MOUTH EVERY 4 (FOUR) HOURS AS NEEDED (MAX 6 Q).     pantoprazole (PROTONIX) 40 MG tablet Take 1 tablet (40 mg total) by mouth 2 (two) times daily. 60 tablet 0   phenazopyridine (PYRIDIUM) 100 MG tablet Take 1 tablet 3 times a day by oral route.     polyethylene glycol powder (GLYCOLAX/MIRALAX) 17 GM/SCOOP powder Take 17 g by mouth 2 (two) times daily. 238 g 0    pregabalin (LYRICA) 75 MG capsule Take 1 capsule twice a day by oral route.     Propylene Glycol 0.6 % SOLN Place 1 drop into both eyes 4 (four) times daily.     rifaximin (XIFAXAN) 550 MG TABS tablet Take 1 tablet (550 mg total) by mouth 2 (two) times daily. 60 tablet 0   tamsulosin (FLOMAX) 0.4 MG CAPS capsule Take 1 capsule (0.4 mg total) by mouth daily after breakfast. 30 capsule 0   traMADol (ULTRAM) 50 MG tablet 1 q 6 hrs prn pain     traZODone (DESYREL) 50 MG tablet Take 1 tablet (50 mg total) by mouth at bedtime. 30 tablet 0   No current facility-administered medications on file prior to visit.    ALLERGIES: No Known Allergies  FAMILY HISTORY: Family History  Problem Relation Age of Onset   Diabetes Mother    Heart disease Mother    Stroke Mother    CAD Mother    Diabetes Father    Congestive Heart Failure Father    Cancer Brother    Heart disease Son    COPD Son    Emphysema Son    Heart attack Brother     Objective:  *** General: No acute distress.  Patient appears well-groomed.   Head:  Normocephalic/atraumatic Eyes:  fundi examined but not visualized Neck: supple, no paraspinal tenderness, full range of motion Back: No paraspinal tenderness Heart: regular rate and rhythm Lungs: Clear to auscultation bilaterally. Vascular: No carotid bruits. Neurological Exam: Mental status: alert and oriented to person, place, and time, speech fluent and not dysarthric, language intact. Cranial nerves: CN I: not tested CN II: pupils equal, round and reactive to light, visual fields intact CN III, IV, VI:  full range of motion, no nystagmus, no ptosis CN V: facial sensation intact. CN VII: upper and lower face symmetric CN VIII: hearing intact CN IX, X: gag intact, uvula midline CN XI: sternocleidomastoid and trapezius muscles intact CN XII: tongue midline Bulk & Tone: normal, no fasciculations. Motor:  muscle strength 5/5 throughout Sensation:  Pinprick, temperature  and vibratory sensation intact. Deep Tendon Reflexes:  2+ throughout,  toes downgoing.   Finger to nose testing:  Without dysmetria.   Heel to shin:  Without dysmetria.   Gait:  Normal station and stride.  Romberg negative.  Thank you for allowing me to take part in the care of this patient.  Metta Clines, DO  CC: ***

## 2021-09-23 ENCOUNTER — Ambulatory Visit: Payer: Medicare PPO | Admitting: Neurology

## 2022-10-05 ENCOUNTER — Other Ambulatory Visit: Payer: Self-pay | Admitting: Urology

## 2022-10-10 NOTE — Patient Instructions (Signed)
SURGICAL WAITING ROOM VISITATION  Patients having surgery or a procedure may have no more than 2 support people in the waiting area - these visitors may rotate.    Children under the age of 1 must have an adult with them who is not the patient.  Due to an increase in RSV and influenza rates and associated hospitalizations, children ages 25 and under may not visit patients in [facility] hospitals.  If the patient needs to stay at the hospital during part of their recovery, the visitor guidelines for inpatient rooms apply. Pre-op nurse will coordinate an appropriate time for 1 support person to accompany patient in pre-op.  This support person may not rotate.    Please refer to the [facility] website for the visitor guidelines for Inpatients (after your surgery is over and you are in a regular room).    Your procedure is scheduled on: 10/21/22   Report to [facility] Main Entrance    Report to admitting at 8:45 AM   Call this number if you have problems the morning of surgery [phone]   Do not eat food or drink liquids :After Midnight.          If you have questions, please contact your surgeon's office.   FOLLOW BOWEL PREP AND ANY ADDITIONAL PRE OP INSTRUCTIONS YOU RECEIVED FROM YOUR SURGEON'S OFFICE!!!     Oral Hygiene is also important to reduce your risk of infection.                                    Remember - BRUSH YOUR TEETH THE MORNING OF SURGERY WITH YOUR REGULAR TOOTHPASTE  DENTURES WILL BE REMOVED PRIOR TO SURGERY PLEASE DO NOT APPLY "Poly grip" OR ADHESIVES!!!   Do NOT smoke after Midnight   Stop all vitamins and herbal supplements 7 days before surgery.   Take these medicines the morning of surgery with A SIP OF WATER: Amlodipine, Gabapentin, Pantoprazole   DO NOT TAKE ANY ORAL DIABETIC MEDICATIONS DAY OF YOUR SURGERY  How to Manage Your Diabetes Before and After Surgery  Why is it important to control my blood sugar before and after  surgery? Improving blood sugar levels before and after surgery helps healing and can limit problems. A way of improving blood sugar control is eating a healthy diet by:  Eating less sugar and carbohydrates  Increasing activity/exercise  Talking with your doctor about reaching your blood sugar goals High blood sugars (greater than 180 mg/dL) can raise your risk of infections and slow your recovery, so you will need to focus on controlling your diabetes during the weeks before surgery. Make sure that the doctor who takes care of your diabetes knows about your planned surgery including the date and location.  How do I manage my blood sugar before surgery? Check your blood sugar at least 4 times a day, starting 2 days before surgery, to make sure that the level is not too high or low. Check your blood sugar the morning of your surgery when you wake up and every 2 hours until you get to the Short Stay unit. If your blood sugar is less than 70 mg/dL, you will need to treat for low blood sugar: Do not take insulin. Treat a low blood sugar (less than 70 mg/dL) with  cup of clear juice (cranberry or apple), 4 glucose tablets, OR glucose gel. Recheck blood sugar in 15 minutes  after treatment (to make sure it is greater than 70 mg/dL). If your blood sugar is not greater than 70 mg/dL on recheck, call [phone] for further instructions. Report your blood sugar to the short stay nurse when you get to Short Stay.  If you are admitted to the hospital after surgery: Your blood sugar will be checked by the staff and you will probably be given insulin after surgery (instead of oral diabetes medicines) to make sure you have good blood sugar levels. The goal for blood sugar control after surgery is 80-180 mg/dL.   WHAT DO I DO ABOUT MY DIABETES MEDICATION?  Do not take oral diabetes medicines (pills) the morning of surgery.  THE DAY BEFORE SURGERY, take Metformin as prescribed.    THE MORNING OF  SURGERY, do not take Metformin   Reviewed and Endorsed by [facility] Patient Education Committee, August 2015             You may not have any metal on your body including jewelry, and body piercing             Do not wear lotions, powders, cologne, or deodorant              Men may shave face and neck.   Do not bring valuables to the hospital. [facility] IS NOT             RESPONSIBLE   FOR VALUABLES.   Contacts, glasses, dentures or bridgework may not be worn into surgery.   Bring small overnight bag day of surgery.   DO NOT BRING YOUR HOME MEDICATIONS TO THE HOSPITAL. PHARMACY WILL DISPENSE MEDICATIONS LISTED ON YOUR MEDICATION LIST TO YOU DURING YOUR ADMISSION IN THE HOSPITAL!    Patients discharged on the day of surgery will not be allowed to drive home.  Someone NEEDS to stay with you for the first 24 hours after anesthesia.              Please read over the following fact sheets you were given: IF YOU HAVE QUESTIONS ABOUT YOUR PRE-OP INSTRUCTIONS PLEASE CALL [phone] [place]    If you received a COVID test during your pre-op visit  it is requested that you wear a mask when out in public, stay away from anyone that may not be feeling well and notify your surgeon if you develop symptoms. If you test positive for Covid or have been in contact with anyone that has tested positive in the last 10 days please notify you surgeon.    [place] - Preparing for Surgery Before surgery, you can play an important role.  Because skin is not sterile, your skin needs to be as free of germs as possible.  You can reduce the number of germs on your skin by washing with CHG (chlorahexidine gluconate) soap before surgery.  CHG is an antiseptic cleaner which kills germs and bonds with the skin to continue killing germs even after washing. Please DO NOT use if you have an allergy to CHG or antibacterial soaps.  If your skin becomes reddened/irritated stop using the CHG and inform your nurse when  you arrive at Short Stay. Do not shave (including legs and underarms) for at least 48 hours prior to the first CHG shower.  You may shave your face/neck.  Please follow these instructions carefully:  1.  Shower with CHG Soap the night before surgery and the  morning of surgery.  2.  If you choose to wash your hair,  wash your hair first as usual with your normal  shampoo.  3.  After you shampoo, rinse your hair and body thoroughly to remove the shampoo.                             4.  Use CHG as you would any other liquid soap.  You can apply chg directly to the skin and wash.  Gently with a scrungie or clean washcloth.  5.  Apply the CHG Soap to your body ONLY FROM THE NECK DOWN.   Do   not use on face/ open                           Wound or open sores. Avoid contact with eyes, ears mouth and   genitals (private parts).                       Wash face,  Genitals (private parts) with your normal soap.             6.  Wash thoroughly, paying special attention to the area where your    surgery  will be performed.  7.  Thoroughly rinse your body with warm water from the neck down.  8.  DO NOT shower/wash with your normal soap after using and rinsing off the CHG Soap.                9.  Pat yourself dry with a clean towel.            10.  Wear clean pajamas.            11.  Place clean sheets on your bed the night of your first shower and do not  sleep with pets. Day of Surgery : Do not apply any lotions/deodorants the morning of surgery.  Please wear clean clothes to the hospital/surgery center.  FAILURE TO FOLLOW THESE INSTRUCTIONS MAY RESULT IN THE CANCELLATION OF YOUR SURGERY  PATIENT SIGNATURE_________________________________  NURSE SIGNATURE__________________________________  ________________________________________________________________________

## 2022-10-10 NOTE — Progress Notes (Signed)
COVID Vaccine Completed:  Date of COVID positive in last 90 days:  PCP - [facility] Cardiologist -   Chest x-ray -  EKG -  Stress Test -  ECHO - 12/30/20 Epic Cardiac Cath -  Pacemaker/ICD device last checked: Spinal Cord Stimulator:  Bowel Prep -   Sleep Study -  CPAP -   Fasting Blood Sugar -  Checks Blood Sugar _____ times a day  Last dose of GLP1 agonist-  N/A GLP1 instructions:  N/A   Last dose of SGLT-2 inhibitors-  N/A SGLT-2 instructions: N/A   Blood Thinner Instructions:  Time Aspirin Instructions: ASA 81 Last Dose:  Activity level:  Can go up a flight of stairs and perform activities of daily living without stopping and without symptoms of chest pain or shortness of breath.  Able to exercise without symptoms  Unable to go up a flight of stairs without symptoms of     Anesthesia review: HTN, CVA, COPD, lung nodules, alcoholic cirrhosis, DM2, heart murmur, emphysema   Patient denies shortness of breath, fever, cough and chest pain at PAT appointment  Patient verbalized understanding of instructions that were given to them at the PAT appointment. Patient was also instructed that they will need to review over the PAT instructions again at home before surgery.

## 2022-10-11 ENCOUNTER — Other Ambulatory Visit: Payer: Self-pay

## 2022-10-11 ENCOUNTER — Encounter (HOSPITAL_COMMUNITY)
Admission: RE | Admit: 2022-10-11 | Discharge: 2022-10-11 | Disposition: A | Discharge status: Home / Self Care | Payer: No Typology Code available for payment source | Source: Ambulatory Visit | Attending: Urology | Admitting: Urology

## 2022-10-11 ENCOUNTER — Encounter (HOSPITAL_COMMUNITY): Payer: Self-pay

## 2022-10-11 VITALS — BP 130/67 | HR 77 | Temp 97.5°F | Resp 16 | Ht 67.0 in

## 2022-10-11 DIAGNOSIS — E1165 Type 2 diabetes mellitus with hyperglycemia: Secondary | ICD-10-CM | POA: Diagnosis not present

## 2022-10-11 DIAGNOSIS — Z01810 Encounter for preprocedural cardiovascular examination: Secondary | ICD-10-CM | POA: Diagnosis not present

## 2022-10-11 DIAGNOSIS — Z01818 Encounter for other preprocedural examination: Secondary | ICD-10-CM | POA: Diagnosis present

## 2022-10-11 DIAGNOSIS — R9431 Abnormal electrocardiogram [ECG] [EKG]: Secondary | ICD-10-CM | POA: Insufficient documentation

## 2022-10-11 DIAGNOSIS — Z01812 Encounter for preprocedural laboratory examination: Secondary | ICD-10-CM | POA: Insufficient documentation

## 2022-10-11 HISTORY — DX: Cerebral infarction, unspecified: I63.9

## 2022-10-11 LAB — CBC
HCT: 47.4 % (ref 39.0–52.0)
Hemoglobin: 15 g/dL (ref 13.0–17.0)
MCH: 30.5 pg (ref 26.0–34.0)
MCHC: 31.6 g/dL (ref 30.0–36.0)
MCV: 96.5 fL (ref 80.0–100.0)
Platelets: 94 10*3/uL — ABNORMAL LOW (ref 150–400)
RBC: 4.91 MIL/uL (ref 4.22–5.81)
RDW: 14.6 % (ref 11.5–15.5)
WBC: 6.3 10*3/uL (ref 4.0–10.5)
nRBC: 0.3 % — ABNORMAL HIGH (ref 0.0–0.2)

## 2022-10-11 LAB — BASIC METABOLIC PANEL
Anion gap: 11 (ref 5–15)
BUN: 18 mg/dL (ref 8–23)
CO2: 17 mmol/L — ABNORMAL LOW (ref 22–32)
Calcium: 9.1 mg/dL (ref 8.9–10.3)
Chloride: 108 mmol/L (ref 98–111)
Creatinine, Ser: 0.99 mg/dL (ref 0.61–1.24)
GFR, Estimated: >60 mL/min (ref >60)
Glucose, Bld: 114 mg/dL — ABNORMAL HIGH (ref 70–99)
Potassium: 4.4 mmol/L (ref 3.5–5.1)
Sodium: 136 mmol/L (ref 135–145)

## 2022-10-11 LAB — GLUCOSE, CAPILLARY: Glucose-Capillary: 140 mg/dL — ABNORMAL HIGH (ref 70–99)

## 2022-10-11 LAB — HEMOGLOBIN A1C
Hgb A1c MFr Bld: 7.2 % — ABNORMAL HIGH (ref 4.8–5.6)
Mean Plasma Glucose: 159.94 mg/dL

## 2022-10-14 ENCOUNTER — Encounter (HOSPITAL_COMMUNITY): Payer: Self-pay

## 2022-10-14 NOTE — Progress Notes (Signed)
DISCUSSION: Dalton Miller is a 77 yo male who presents to PAT prior to TRANSURETHRAL RESECTION OF BLADDER TUMOR (TURBT) and TRANSURETHRAL RESECTION OF BLADDER TUMOR (TURBT) on 10/21/22 with Dr. Berneice Heinrich. PMH of current smoking, HTN, heart murmur (as a child), COPD, hx of CVA (12/2020), T2DM, hx of kidney stones, cirrhosis, melanoma with mets to lung, hx of RCC s/p L nephrourterectomy (2017), bladder cancer s/p TURBT (09/2020), arthritis.  Follows with Oncology at the [facility] due to metastatic melanoma, recurrent bladder cancer, anemia, thrombocytopenia. Last seen on 08/24/22. Labs stable. Advised f/u in 5 months and to f/u with Urology due to recurrent bladder cancer.  Follows with PCP for chronic medical issues. Last seen on 06/03/22 for DM and BP management. BP noted to controlled on current regimen. A1c is 7.2 on PAT labs. Takes Metformin  VS: BP 130/67   Pulse 77   Temp (!) 36.4 C (Oral)   Resp 16   Ht 5\' 7"  (1.702 m)   SpO2 100%   BMI 24.72 kg/m   PROVIDERS: Clinic, [facility] - Concha Pyo, MD Heme/Onc: Ovidio Hanger ([facility])  LABS: Labs reviewed: Acceptable for surgery. (all labs ordered are listed, but only abnormal results are displayed)  Labs Reviewed  HEMOGLOBIN A1C - Abnormal; Notable for the following components:      Result Value   Hgb A1c MFr Bld 7.2 (*)    All other components within normal limits  BASIC METABOLIC PANEL - Abnormal; Notable for the following components:   CO2 17 (*)    Glucose, Bld 114 (*)    All other components within normal limits  CBC - Abnormal; Notable for the following components:   Platelets 94 (*)    nRBC 0.3 (*)    All other components within normal limits  GLUCOSE, CAPILLARY - Abnormal; Notable for the following components:   Glucose-Capillary 140 (*)    All other components within normal limits     IMAGES:  CT chest/abdomen/pelvis [facility]):  Impression:   1. Emphysema with a 6 mm nodular density in the posterior right   upper lobe and a 4 mm nodular density in the medial right lower  lobe that have developed from prior examinations. Neoplasm is  within the differential. Close attention on follow-up is  recommended.   2. A subtle thickening of the right posterior bladder wall near  the bladder neck and a 7 mm focus of left posterior bladder wall  thickening are concerning for bladder carcinoma.    EKG 10/11/2022:  Normal sinus rhythm Left anterior fascicular block Septal infarct , age undetermined  CV:  Echo 12/30/2020:  IMPRESSIONS     1. Left ventricular ejection fraction, by estimation, is 50 to 55%. The  left ventricle has low normal function. The left ventricle has no regional  wall motion abnormalities. Left ventricular diastolic parameters are  consistent with Grade I diastolic  dysfunction (impaired relaxation).   2. Right ventricular systolic function is normal. The right ventricular  size is normal. There is normal pulmonary artery systolic pressure.   3. The mitral valve is normal in structure. Trivial mitral valve  regurgitation. No evidence of mitral stenosis.   4. The aortic valve is tricuspid. There is mild calcification of the  aortic valve. There is mild thickening of the aortic valve. Aortic valve  regurgitation is not visualized. Aortic valve sclerosis is present, with  no evidence of aortic valve stenosis.   5. The inferior vena cava is normal in size with <50% respiratory  variability, suggesting right atrial pressure of 8 mmHg.   Past Medical History:  Diagnosis Date   Acquired renal cyst of right kidney    Arthritis    Cancer (HCC)    renal cell    Cirrhosis (HCC)    Erectile dysfunction    Heart murmur    CHILDHOOD   Hematuria    left ureteral bleeding   History of cellulitis    2012 left wrist   History of kidney stones    Hyperlipidemia    Hypertension    Metastatic melanoma to lung, left (HCC)    Nephrolithiasis    bilateral non-obstructive per ct  07-02-2015   Stroke [facility])    Type 2 diabetes mellitus (HCC)    Wears glasses     Past Surgical History:  Procedure Laterality Date   CARPAL TUNNEL RELEASE Right 1990  approx   COLONOSCOPY  03/04/2010   CYSTOSCOPY W/ RETROGRADES Left 10/28/2015   Procedure: CYSTOSCOPY WITH LEFT RETROGRADE PYELOGRAM;  Surgeon: Malen Gauze, MD;  Location: [facility];  Service: Urology;  Laterality: Left;   CYSTOSCOPY W/ URETERAL STENT PLACEMENT Left 10/28/2015   Procedure: CYSTOSCOPY WITH LEFT URETERAL STENT EXCHANGE;  Surgeon: Malen Gauze, MD;  Location: [facility];  Service: Urology;  Laterality: Left;   CYSTOSCOPY WITH RETROGRADE PYELOGRAM, URETEROSCOPY AND STENT PLACEMENT Bilateral 09/17/2015   Procedure: CYSTOSCOPY WITH BILATERAL RETROGRADE PYELOGRAM, LEFT URETEROSCOPY WITH URETERAL BIOPSY AND  STENT PLACEMENT;  Surgeon: Bjorn Pippin, MD;  Location[facility];  Service: Urology;  Laterality: Bilateral;   ESOPHAGEAL BANDING  12/30/2020   Procedure: ESOPHAGEAL BANDING;  Surgeon: Meryl Dare, MD;  Location: [facility] ENDOSCOPY;  Service: Endoscopy;;   ESOPHAGOGASTRODUODENOSCOPY (EGD) WITH PROPOFOL N/A 12/30/2020   Procedure: ESOPHAGOGASTRODUODENOSCOPY (EGD) WITH PROPOFOL;  Surgeon: Meryl Dare, MD;  Location: [facility] ENDOSCOPY;  Service: Endoscopy;  Laterality: N/A;   HOLMIUM LASER APPLICATION Left 09/17/2015   Procedure: HOLMIUM LASER APPLICATION;  Surgeon: Bjorn Pippin, MD;  Location[facility];  Service: Urology;  Laterality: Left;   INTRAMEDULLARY (IM) NAIL INTERTROCHANTERIC Left 05/28/2021   Procedure: INTRAMEDULLARY (IM) NAIL INTERTROCHANTRIC;  Surgeon: Roby Lofts, MD;  [facility];  Service: Orthopedics;  Laterality: Left;   KNEE ARTHROSCOPY Right 2008   MELANOMA EXCISION  2018   Left arm   right total knee replacement  Right    2009   ROBOT ASSITED LAPAROSCOPIC NEPHROURETERECTOMY Left 12/04/2015   Procedure: XI ROBOT ASSITED LAPAROSCOPIC  NEPHROURETERECTOMY;  Surgeon: Malen Gauze, MD;  Location: [facility];  Service: Urology;  Laterality: Left;   ROTATOR CUFF REPAIR Right    SHOULDER ARTHROSCOPY Right 12/17/2020   Procedure: Right shoulder arthroscopy, debridement, subacromial decompression, distal clavicle resection;  Surgeon: Francena Hanly, MD;  Location: [facility];  Service: Orthopedics;  Laterality: Right;    STONE EXTRACTION WITH BASKET Left 09/17/2015   Procedure: STONE EXTRACTION WITH BASKET;  Surgeon: Bjorn Pippin, MD;  Location: [facility];  Service: Urology;  Laterality: Left;   TOTAL KNEE ARTHROPLASTY Right 2009   URETEROSCOPY  10/28/2015   Procedure: DIAGNOSTIC LEFT URETEROSCOPY;  Surgeon: Malen Gauze, MD;  Location: [facility];  Service: Urology;;   WISDOM TOOTH EXTRACTION      MEDICATIONS:  amLODipine (NORVASC) 2.5 MG tablet   aspirin EC 81 MG tablet   atorvastatin (LIPITOR) 10 MG tablet   ferrous sulfate 325 (65 FE) MG EC tablet   gabapentin (NEURONTIN) 300 MG capsule   metFORMIN (GLUCOPHAGE)  500 MG tablet   pantoprazole (PROTONIX) 40 MG tablet   Propylene Glycol 0.6 % SOLN   No current facility-administered medications for this encounter.   Marcille Blanco [facility] Surgical Short Stay/Anesthesiology [facility] Phone [phone] 10/18/2022 3:47 PM

## 2022-10-18 NOTE — Anesthesia Preprocedure Evaluation (Addendum)
Anesthesia Evaluation  Patient identified by MRN, date of birth, ID band Patient awake    Reviewed: Allergy & Precautions, NPO status , Patient's Chart, lab work & pertinent test results  Airway Mallampati: III  TM Distance: >3 FB Neck ROM: Full    Dental  (+) Edentulous Upper, Missing, Dental Advisory Given,    Pulmonary COPD (no inhalers), Current Smoker and Patient abstained from smoking. Current smoker, 63 pack year history  Metastatic melanoma, mets to lung 1 ppd    + decreased breath sounds+ wheezing      Cardiovascular hypertension (140/81 preop), Pt. on medications Normal cardiovascular exam Rhythm:Regular Rate:Normal  Echo 2022  1. Left ventricular ejection fraction, by estimation, is 50 to 55%. The  left ventricle has low normal function. The left ventricle has no regional  wall motion abnormalities. Left ventricular diastolic parameters are  consistent with Grade I diastolic  dysfunction (impaired relaxation).   2. Right ventricular systolic function is normal. The right ventricular  size is normal. There is normal pulmonary artery systolic pressure.   3. The mitral valve is normal in structure. Trivial mitral valve  regurgitation. No evidence of mitral stenosis.   4. The aortic valve is tricuspid. There is mild calcification of the  aortic valve. There is mild thickening of the aortic valve. Aortic valve  regurgitation is not visualized. Aortic valve sclerosis is present, with  no evidence of aortic valve stenosis.   5. The inferior vena cava is normal in size with <50% respiratory  variability, suggesting right atrial pressure of 8 mmHg.     Neuro/Psych  PSYCHIATRIC DISORDERS Anxiety     negative neurological ROS     GI/Hepatic Neg liver ROS,GERD  Medicated and Controlled,,  Endo/Other  diabetes, Well Controlled, Type 2, Oral Hypoglycemic Agents  A1c 7.2  Renal/GU Renal disease (hx RCC s/p nephroureterectomy  2017) Bladder dysfunction (bladder ca)      Musculoskeletal  (+) Arthritis , Osteoarthritis,    Abdominal   Peds  Hematology negative hematology ROS (+) Hb 15, plt 94   Anesthesia Other Findings   Reproductive/Obstetrics negative OB ROS                             Anesthesia Physical Anesthesia Plan  ASA: 3  Anesthesia Plan: General   Post-op Pain Management: Tylenol PO (pre-op)*   Induction: Intravenous  PONV Risk Score and Plan: 1 and Ondansetron, Dexamethasone and Treatment may vary due to age or medical condition  Airway Management Planned: Oral ETT  Additional Equipment: None  Intra-op Plan:   Post-operative Plan: Extubation in OR  Informed Consent: I have reviewed the patients History and Physical, chart, labs and discussed the procedure including the risks, benefits and alternatives for the proposed anesthesia with the patient or authorized representative who has indicated his/her understanding and acceptance.     Dental advisory given  Plan Discussed with: CRNA  Anesthesia Plan Comments: (Last airway note: Ventilation: Mask ventilation without difficulty Laryngoscope Size: Miller and 2 Grade View: Grade I Tube type: Oral Tube size: 7.5 mm Number of attempts: 1  )        Anesthesia Quick Evaluation

## 2022-10-21 ENCOUNTER — Ambulatory Visit (HOSPITAL_COMMUNITY): Payer: No Typology Code available for payment source | Admitting: Medical

## 2022-10-21 ENCOUNTER — Other Ambulatory Visit (HOSPITAL_COMMUNITY): Payer: Self-pay

## 2022-10-21 ENCOUNTER — Ambulatory Visit (HOSPITAL_COMMUNITY)
Admission: RE | Admit: 2022-10-21 | Discharge: 2022-10-21 | Disposition: A | Discharge status: Home / Self Care | Payer: No Typology Code available for payment source | Attending: Urology | Admitting: Urology

## 2022-10-21 ENCOUNTER — Ambulatory Visit (HOSPITAL_COMMUNITY): Payer: No Typology Code available for payment source

## 2022-10-21 ENCOUNTER — Encounter (HOSPITAL_COMMUNITY): Payer: Self-pay | Admitting: Urology

## 2022-10-21 ENCOUNTER — Encounter (HOSPITAL_COMMUNITY)
Admission: RE | Disposition: A | Discharge status: Home / Self Care | Payer: Self-pay | Source: Home / Self Care | Attending: Urology

## 2022-10-21 ENCOUNTER — Ambulatory Visit (HOSPITAL_BASED_OUTPATIENT_CLINIC_OR_DEPARTMENT_OTHER): Payer: No Typology Code available for payment source | Admitting: Anesthesiology

## 2022-10-21 DIAGNOSIS — I10 Essential (primary) hypertension: Secondary | ICD-10-CM | POA: Diagnosis not present

## 2022-10-21 DIAGNOSIS — F419 Anxiety disorder, unspecified: Secondary | ICD-10-CM | POA: Diagnosis not present

## 2022-10-21 DIAGNOSIS — Z905 Acquired absence of kidney: Secondary | ICD-10-CM | POA: Diagnosis not present

## 2022-10-21 DIAGNOSIS — N200 Calculus of kidney: Secondary | ICD-10-CM | POA: Insufficient documentation

## 2022-10-21 DIAGNOSIS — E119 Type 2 diabetes mellitus without complications: Secondary | ICD-10-CM | POA: Diagnosis not present

## 2022-10-21 DIAGNOSIS — Z906 Acquired absence of other parts of urinary tract: Secondary | ICD-10-CM | POA: Diagnosis not present

## 2022-10-21 DIAGNOSIS — C679 Malignant neoplasm of bladder, unspecified: Secondary | ICD-10-CM | POA: Diagnosis present

## 2022-10-21 DIAGNOSIS — C7802 Secondary malignant neoplasm of left lung: Secondary | ICD-10-CM | POA: Insufficient documentation

## 2022-10-21 DIAGNOSIS — E1165 Type 2 diabetes mellitus with hyperglycemia: Secondary | ICD-10-CM

## 2022-10-21 DIAGNOSIS — E785 Hyperlipidemia, unspecified: Secondary | ICD-10-CM | POA: Diagnosis not present

## 2022-10-21 DIAGNOSIS — K7460 Unspecified cirrhosis of liver: Secondary | ICD-10-CM | POA: Insufficient documentation

## 2022-10-21 DIAGNOSIS — Z85528 Personal history of other malignant neoplasm of kidney: Secondary | ICD-10-CM | POA: Diagnosis not present

## 2022-10-21 DIAGNOSIS — Z833 Family history of diabetes mellitus: Secondary | ICD-10-CM | POA: Insufficient documentation

## 2022-10-21 DIAGNOSIS — J449 Chronic obstructive pulmonary disease, unspecified: Secondary | ICD-10-CM

## 2022-10-21 DIAGNOSIS — Z85820 Personal history of malignant melanoma of skin: Secondary | ICD-10-CM | POA: Insufficient documentation

## 2022-10-21 DIAGNOSIS — M1990 Unspecified osteoarthritis, unspecified site: Secondary | ICD-10-CM | POA: Insufficient documentation

## 2022-10-21 DIAGNOSIS — Z8673 Personal history of transient ischemic attack (TIA), and cerebral infarction without residual deficits: Secondary | ICD-10-CM | POA: Diagnosis not present

## 2022-10-21 DIAGNOSIS — C675 Malignant neoplasm of bladder neck: Secondary | ICD-10-CM | POA: Insufficient documentation

## 2022-10-21 DIAGNOSIS — I358 Other nonrheumatic aortic valve disorders: Secondary | ICD-10-CM | POA: Insufficient documentation

## 2022-10-21 DIAGNOSIS — K219 Gastro-esophageal reflux disease without esophagitis: Secondary | ICD-10-CM | POA: Insufficient documentation

## 2022-10-21 DIAGNOSIS — F17290 Nicotine dependence, other tobacco product, uncomplicated: Secondary | ICD-10-CM | POA: Diagnosis not present

## 2022-10-21 DIAGNOSIS — Z7984 Long term (current) use of oral hypoglycemic drugs: Secondary | ICD-10-CM | POA: Insufficient documentation

## 2022-10-21 HISTORY — PX: CYSTOSCOPY/URETEROSCOPY/HOLMIUM LASER/STENT PLACEMENT: SHX6546

## 2022-10-21 HISTORY — PX: TRANSURETHRAL RESECTION OF BLADDER TUMOR: SHX2575

## 2022-10-21 LAB — GLUCOSE, CAPILLARY
Glucose-Capillary: 169 mg/dL — ABNORMAL HIGH (ref 70–99)
Glucose-Capillary: 147 mg/dL — ABNORMAL HIGH (ref 70–99)

## 2022-10-21 SURGERY — TURBT (TRANSURETHRAL RESECTION OF BLADDER TUMOR)
Anesthesia: General | Site: Ureter | Laterality: Right

## 2022-10-21 MED ORDER — CEPHALEXIN 500 MG PO CAPS
500.0000 mg | ORAL_CAPSULE | Freq: Two times a day (BID) | ORAL | 0 refills | Status: AC
  Filled 2022-10-21: qty 6, 3d supply, fill #0

## 2022-10-21 MED ORDER — ONDANSETRON HCL 4 MG/2ML IJ SOLN
INTRAMUSCULAR | Status: DC | PRN
  Administered 2022-10-21: 4 mg via INTRAVENOUS

## 2022-10-21 MED ORDER — AMISULPRIDE (ANTIEMETIC) 5 MG/2ML IV SOLN: 10.0000 mg | Freq: Once | INTRAVENOUS | Status: DC | PRN

## 2022-10-21 MED ORDER — SODIUM CHLORIDE 0.9 % IR SOLN
Status: DC | PRN
  Administered 2022-10-21: 3000 mL

## 2022-10-21 MED ORDER — INSULIN ASPART 100 UNIT/ML IJ SOLN: 0.0000 [IU] | INTRAMUSCULAR | Status: DC | PRN

## 2022-10-21 MED ORDER — HYDROMORPHONE HCL 1 MG/ML IJ SOLN: 0.2500 mg | INTRAMUSCULAR | Status: DC | PRN

## 2022-10-21 MED ORDER — FENTANYL CITRATE (PF) 100 MCG/2ML IJ SOLN
INTRAMUSCULAR | Status: DC | PRN
  Administered 2022-10-21 (×2): 25 ug via INTRAVENOUS

## 2022-10-21 MED ORDER — CHLORHEXIDINE GLUCONATE 0.12 % MT SOLN
15.0000 mL | Freq: Once | OROMUCOSAL | Status: AC
  Administered 2022-10-21: 15 mL via OROMUCOSAL

## 2022-10-21 MED ORDER — GENTAMICIN SULFATE 40 MG/ML IJ SOLN
5.0000 mg/kg | INTRAVENOUS | Status: AC
  Administered 2022-10-21: 370 mg via INTRAVENOUS
  Filled 2022-10-21: qty 9.25

## 2022-10-21 MED ORDER — SENNOSIDES-DOCUSATE SODIUM 8.6-50 MG PO TABS
1.0000 | ORAL_TABLET | Freq: Two times a day (BID) | ORAL | 0 refills | Status: AC
  Filled 2022-10-21: qty 10, 5d supply, fill #0

## 2022-10-21 MED ORDER — LACTATED RINGERS IV SOLN: INTRAVENOUS | Status: DC | PRN

## 2022-10-21 MED ORDER — LIDOCAINE HCL (CARDIAC) PF 100 MG/5ML IV SOSY
PREFILLED_SYRINGE | INTRAVENOUS | Status: DC | PRN
  Administered 2022-10-21: 80 mg via INTRAVENOUS

## 2022-10-21 MED ORDER — LACTATED RINGERS IV SOLN: INTRAVENOUS | Status: DC

## 2022-10-21 MED ORDER — FENTANYL CITRATE (PF) 100 MCG/2ML IJ SOLN
INTRAMUSCULAR | Status: AC
  Filled 2022-10-21: qty 2

## 2022-10-21 MED ORDER — ACETAMINOPHEN 500 MG PO TABS
1000.0000 mg | ORAL_TABLET | Freq: Once | ORAL | Status: AC
  Administered 2022-10-21: 1000 mg via ORAL
  Filled 2022-10-21: qty 2

## 2022-10-21 MED ORDER — ONDANSETRON HCL 4 MG/2ML IJ SOLN: 4.0000 mg | Freq: Once | INTRAMUSCULAR | Status: DC | PRN

## 2022-10-21 MED ORDER — OXYCODONE HCL 5 MG/5ML PO SOLN: 5.0000 mg | Freq: Once | ORAL | Status: DC | PRN

## 2022-10-21 MED ORDER — KETOROLAC TROMETHAMINE 10 MG PO TABS
10.0000 mg | ORAL_TABLET | Freq: Three times a day (TID) | ORAL | 0 refills | Status: AC | PRN
Start: 2022-10-21 — End: ?
  Filled 2022-10-21: qty 15, 5d supply, fill #0

## 2022-10-21 MED ORDER — PHENYLEPHRINE HCL (PRESSORS) 10 MG/ML IV SOLN
INTRAVENOUS | Status: DC | PRN
Start: 2022-10-21 — End: 2022-10-21
  Administered 2022-10-21 (×2): 80 ug via INTRAVENOUS

## 2022-10-21 MED ORDER — PROPOFOL 10 MG/ML IV BOLUS
INTRAVENOUS | Status: DC | PRN
  Administered 2022-10-21: 150 mg via INTRAVENOUS

## 2022-10-21 MED ORDER — OXYCODONE HCL 5 MG PO TABS: 5.0000 mg | ORAL_TABLET | Freq: Once | ORAL | Status: DC | PRN

## 2022-10-21 MED ORDER — ORAL CARE MOUTH RINSE: 15.0000 mL | Freq: Once | OROMUCOSAL | Status: AC

## 2022-10-21 MED ORDER — OXYCODONE HCL 5 MG PO TABS
5.0000 mg | ORAL_TABLET | Freq: Four times a day (QID) | ORAL | 0 refills | Status: AC | PRN
Start: 2022-10-21 — End: 2023-10-21
  Filled 2022-10-21: qty 15, 4d supply, fill #0

## 2022-10-21 SURGICAL SUPPLY — 32 items
BAG DRN RND TRDRP ANRFLXCHMBR (UROLOGICAL SUPPLIES)
BAG URINE DRAIN 2000ML AR STRL (UROLOGICAL SUPPLIES) IMPLANT
BAG URO CATCHER STRL LF (MISCELLANEOUS) ×2 IMPLANT
BASKET LASER NITINOL 1.9FR (BASKET) IMPLANT
BSKT STON RTRVL 120 1.9FR (BASKET) ×2
CATH URETL OPEN END 6FR 70 (CATHETERS) ×2 IMPLANT
CLOTH BEACON ORANGE TIMEOUT ST (SAFETY) ×2 IMPLANT
DRAPE FOOT SWITCH (DRAPES) ×2 IMPLANT
ELECT REM PT RETURN 15FT ADLT (MISCELLANEOUS) ×2 IMPLANT
EVACUATOR MICROVAS BLADDER (UROLOGICAL SUPPLIES) IMPLANT
EXTRACTOR STONE 1.7FRX115CM (UROLOGICAL SUPPLIES) IMPLANT
GLOVE SURG LX STRL 7.5 STRW (GLOVE) ×2 IMPLANT
GOWN STRL REUS W/ TWL XL LVL3 (GOWN DISPOSABLE) ×2 IMPLANT
GOWN STRL REUS W/TWL XL LVL3 (GOWN DISPOSABLE) ×2
GUIDEWIRE ANG ZIPWIRE 038X150 (WIRE) ×2 IMPLANT
GUIDEWIRE STR DUAL SENSOR (WIRE) ×2 IMPLANT
KIT TURNOVER KIT A (KITS) IMPLANT
LASER FIB FLEXIVA PULSE ID 365 (Laser) IMPLANT
LOOP CUT BIPOLAR 24F LRG (ELECTROSURGICAL) IMPLANT
MANIFOLD NEPTUNE II (INSTRUMENTS) ×2 IMPLANT
PACK CYSTO (CUSTOM PROCEDURE TRAY) ×2 IMPLANT
PAD PREP 24X48 CUFFED NSTRL (MISCELLANEOUS) ×2 IMPLANT
SHEATH NAVIGATOR HD 11/13X28 (SHEATH) IMPLANT
SHEATH NAVIGATOR HD 11/13X36 (SHEATH) IMPLANT
STENT POLARIS 5FRX26 (STENTS) IMPLANT
SYR TOOMEY IRRIG 70ML (MISCELLANEOUS)
SYRINGE TOOMEY IRRIG 70ML (MISCELLANEOUS) IMPLANT
TRACTIP FLEXIVA PULS ID 200XHI (Laser) IMPLANT
TRACTIP FLEXIVA PULSE ID 200 (Laser)
TUBE PU 8FR 16IN ENFIT (TUBING) ×2 IMPLANT
TUBING CONNECTING 10 (TUBING) ×2 IMPLANT
TUBING UROLOGY SET (TUBING) ×2 IMPLANT

## 2022-10-21 NOTE — Transfer of Care (Signed)
Immediate Anesthesia Transfer of Care Note  Patient: Dalton Miller  Procedure(s) Performed: TRANSURETHRAL RESECTION OF BLADDER TUMOR (TURBT) (Bladder) CYSTOSCOPY/URETEROSCOPYBASKET STONE EXTRACTION/STENT PLACEMENT (Right: Ureter)  Patient Location: PACU  Anesthesia Type:General  Level of Consciousness: drowsy  Airway & Oxygen Therapy: Patient Spontanous Breathing and Patient connected to face mask oxygen  Post-op Assessment: Report given to RN and Post -op Vital signs reviewed and stable  Post vital signs: Reviewed and stable  Last Vitals:  Vitals Value Taken Time  BP    Temp    Pulse    Resp    SpO2      Last Pain:  Vitals:   10/21/22 0854  TempSrc: Oral  PainSc: 0-No pain      Patients Stated Pain Goal: 3 (10/21/22 0854)  Complications: No notable events documented.

## 2022-10-21 NOTE — Anesthesia Postprocedure Evaluation (Signed)
Anesthesia Post Note  Patient: Dalton Miller  Procedure(s) Performed: TRANSURETHRAL RESECTION OF BLADDER TUMOR (TURBT) (Bladder) CYSTOSCOPY/URETEROSCOPYBASKET STONE EXTRACTION/STENT PLACEMENT (Right: Ureter)     Patient location during evaluation: PACU Anesthesia Type: General Level of consciousness: awake and alert, oriented and patient cooperative Pain management: pain level controlled Vital Signs Assessment: post-procedure vital signs reviewed and stable Respiratory status: spontaneous breathing, nonlabored ventilation and respiratory function stable Cardiovascular status: blood pressure returned to baseline and stable Postop Assessment: no apparent nausea or vomiting Anesthetic complications: no   No notable events documented.  Last Vitals:  Vitals:   10/21/22 1230 10/21/22 1252  BP: (!) 158/83 (!) 164/80  Pulse: 75 71  Resp: 14 14  Temp:  (!) 36.4 C  SpO2: 95% 93%    Last Pain:  Vitals:   10/21/22 1252  TempSrc: Oral  PainSc: 1                  Lannie Fields

## 2022-10-21 NOTE — H&P (Signed)
Dalton Miller is an 77 y.o. male.    Chief Complaint: Pre-OP TURBT and RIGHT ureteroscopic Stone Manipulation  HPI:   1 - Multofical Recurrent Low Grade Urothelial Carcinoma - s/p LEFT nephr-ureterectomy by Dalton Miller around 2017 for large voluem low grade disease. Has had variable surveilalnce across multiple institutions incluidng [place].   07/2022 - [facility] feels [facility] not equipped to deal with it for uncelar reasons   2- Urolithiasis - Rt small ureteral and renal sotnes on CT at [facility] 09/2202 per report, minimal hydro. Cr 1.0 most recently. This is in a solitary kidneyu   3 - Solirtary Right Kidney - s/p left neph-U as per above. Cr <1.5 x several post nephrectomhy.   PMH sig for metastatic melanoma (currently remission), >50PY smoker / still smokes CVA/rolling walker. LIves with his son Dalton Miller he helps with ADL's. 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 His PCP is through [facility].   Today "Dalton Miller" is seen to proceed with TURBT and RIGHT ureteroscopy for recurrent bladder cancer and stone in solitary kidney. Most recent Cr 0.99, A1c 7.    Past Medical History:  Diagnosis Date   Acquired renal cyst of right kidney    Arthritis    Cancer (HCC)    renal cell    Cirrhosis (HCC)    Erectile dysfunction    Heart murmur    CHILDHOOD   Hematuria    left ureteral bleeding   History of cellulitis    2012 left wrist   History of kidney stones    Hyperlipidemia    Hypertension    Metastatic melanoma to lung, left (HCC)    Nephrolithiasis    bilateral non-obstructive per ct 07-02-2015   Stroke [facility])    Type 2 diabetes mellitus (HCC)    Wears glasses     Past Surgical History:  Procedure Laterality Date   CARPAL TUNNEL RELEASE Right 1990  approx   COLONOSCOPY  03/04/2010   CYSTOSCOPY W/ RETROGRADES Left 10/28/2015   Procedure: CYSTOSCOPY WITH LEFT RETROGRADE PYELOGRAM;  Surgeon: Malen Gauze, MD;  [facility];  Service: Urology;   Laterality: Left;   CYSTOSCOPY W/ URETERAL STENT PLACEMENT Left 10/28/2015   Procedure: CYSTOSCOPY WITH LEFT URETERAL STENT EXCHANGE;  Surgeon: Malen Gauze, MD;  Location[facility];  Service: Urology;  Laterality: Left;   CYSTOSCOPY WITH RETROGRADE PYELOGRAM, URETEROSCOPY AND STENT PLACEMENT Bilateral 09/17/2015   Procedure: CYSTOSCOPY WITH BILATERAL RETROGRADE PYELOGRAM, LEFT URETEROSCOPY WITH URETERAL BIOPSY AND  STENT PLACEMENT;  Surgeon: Bjorn Pippin, MD;  Location[facility];  Service: Urology;  Laterality: Bilateral;   ESOPHAGEAL BANDING  12/30/2020   Procedure: ESOPHAGEAL BANDING;  Surgeon: Meryl Dare, MD;  Location[facility]COPY;  Service: Endoscopy;;   ESOPHAGOGASTRODUODENOSCOPY (EGD) WITH PROPOFOL N/A 12/30/2020   Procedure: ESOPHAGOGASTRODUODENOSCOPY (EGD) WITH PROPOFOL;  Surgeon: Meryl Dare, MD;  Location: [facility] ENDOSCOPY;  Service: Endoscopy;  Laterality: N/A;   HOLMIUM LASER APPLICATION Left 09/17/2015   Procedure: HOLMIUM LASER APPLICATION;  Surgeon: Bjorn Pippin, MD;  Location[facility];  Service: Urology;  Laterality: Left;   INTRAMEDULLARY (IM) NAIL INTERTROCHANTERIC Left 05/28/2021   Procedure: INTRAMEDULLARY (IM) NAIL INTERTROCHANTRIC;  Surgeon: Roby Lofts, MD;  [facility]: [facility];  Service: Orthopedics;  Laterality: Left;   KNEE ARTHROSCOPY Right 2008   MELANOMA EXCISION  2018   Left arm   right total knee replacement  Right    2009   ROBOT ASSITED LAPAROSCOPIC  NEPHROURETERECTOMY Left 12/04/2015   Procedure: XI ROBOT ASSITED LAPAROSCOPIC NEPHROURETERECTOMY;  Surgeon: Malen Gauze, MD;  [facility];  Service: Urology;  Laterality: Left;   ROTATOR CUFF REPAIR Right    SHOULDER ARTHROSCOPY Right 12/17/2020   Procedure: Right shoulder arthroscopy, debridement, subacromial decompression, distal clavicle resection;  Surgeon: Francena Hanly, MD;  Location[facility];  Service: Orthopedics;  Laterality: Right;    STONE  EXTRACTION WITH BASKET Left 09/17/2015   Procedure: STONE EXTRACTION WITH BASKET;  Surgeon: Bjorn Pippin, MD;  Location: [facility];  Service: Urology;  Laterality: Left;   TOTAL KNEE ARTHROPLASTY Right 2009   URETEROSCOPY  10/28/2015   Procedure: DIAGNOSTIC LEFT URETEROSCOPY;  Surgeon: Malen Gauze, MD;  [facility];  Service: Urology;;   WISDOM TOOTH EXTRACTION      Family History  Problem Relation Age of Onset   Diabetes Mother    Heart disease Mother    Stroke Mother    CAD Mother    Diabetes Father    Congestive Heart Failure Father    Cancer Brother    Heart disease Son    COPD Son    Emphysema Son    Heart attack Brother    Social History:  reports that he has been smoking cigars and cigarettes. He has a 63 pack-year smoking history. He has been exposed to tobacco smoke. He quit smokeless tobacco use about 29 years ago.  His smokeless tobacco use included chew. He reports that he does not drink alcohol and does not use drugs.  Allergies: No Known Allergies  No medications prior to admission.    No results found for this or any previous visit (from the past 48 hour(s)). No results found.  Review of Systems  Constitutional: Negative.  Negative for chills and fever.    There were no vitals taken for this visit. Physical Exam Constitutional:      Comments: Limitied functional capacity with stigmata of chronic smoking and disease.   HENT:     Head: Normocephalic.  Eyes:     Pupils: Pupils are equal, round, and reactive to light.  Cardiovascular:     Rate and Rhythm: Normal rate.  Pulmonary:     Effort: Pulmonary effort is normal.  Abdominal:     General: Abdomen is flat.  Genitourinary:    Comments: NO CVAT at present.  Musculoskeletal:     Cervical back: Normal range of motion.     Comments: Uses rolling walker  Neurological:     General: No focal deficit present.     Mental Status: He is alert.  Psychiatric:        Mood and  Affect: Mood normal.      Assessment/Plan  Proceed as planned with TURBT, RIGHT ureteroscopic stone manipulation. Risks, benefits, alternatives, expected peri-opp course discussed previousliy and reiterated today. HIs baseline neurologic, pulmonary, and oncologic comorbidity incrases risk of complications including mortality substantially.   Loletta Parish., MD 10/21/2022, 6:54 AM

## 2022-10-21 NOTE — Anesthesia Procedure Notes (Signed)
Procedure Name: LMA Insertion Date/Time: 10/21/2022 11:08 AM  Performed by: Deri Fuelling, CRNAPre-anesthesia Checklist: Patient identified, Emergency Drugs available, Suction available and Patient being monitored Patient Re-evaluated:Patient Re-evaluated prior to induction Oxygen Delivery Method: Circle system utilized Preoxygenation: Pre-oxygenation with 100% oxygen Induction Type: IV induction Ventilation: Mask ventilation without difficulty LMA: LMA with gastric port inserted LMA Size: 4.0 Tube type: Oral Number of attempts: 1 Airway Equipment and Method: Stylet and Oral airway Placement Confirmation: ETT inserted through vocal cords under direct vision, positive ETCO2 and breath sounds checked- equal and bilateral Tube secured with: Tape Dental Injury: Teeth and Oropharynx as per pre-operative assessment

## 2022-10-21 NOTE — Op Note (Signed)
NAME: Dalton Miller, Dalton Miller MEDICAL RECORD NO: [id number] ACCOUNT NO: [id number] DATE OF BIRTH: [birth year] FACILITY: [facility] LOCATION: [facility] PHYSICIAN: Sebastian Ache, MD  Operative Report   DATE OF PROCEDURE: 10/21/2022  PREOPERATIVE DIAGNOSIS:  Recurrent bladder cancer, right renal stone, solitary kidney.  PROCEDURES PERFORMED: 1.  Cystoscopy with right retrograde pyelogram with interpretation. 2.  Right ureteroscopy, basketing of stones. 3.  Insertion of right ureteral stent. 4.  Transurethral resection of bladder tumor, volume small.  ESTIMATED BLOOD LOSS:  Nil.  COMPLICATIONS:  None.  SPECIMEN: 1.  Right renal stone for compositional analysis. 2.  Prostatic urethral tumor for permanent pathology.  FINDINGS: 1.  Subtle papillary tumor in the right bladder neck, prostate lobe area.  Total surface area approximately 2 cm2. 2.  Small volume right intrarenal stones x3, largest fragment, 2-3 mm. 3.  Complete resolution of all accessible stone fragments larger than one-third mm following basket extraction. 4.  Displaced right ureteral stent, proximal end in the renal pelvis, distal end in urinary bladder.  No tether.  INDICATIONS:  The patient is a very comorbid 77 year old man, long-term smoker who continues to smoke, history of multifocal urothelial carcinoma.  He is status post left nephroureterectomy previously.  He gets some care at the [place].  He has got other care  at other institutions.  He was referred from the [place] for recurrence of some urothelial carcinoma as well as some stones in a solitary kidney.  I counseled him towards transurethral resection for diagnostic and therapeutic intent and  right  ureteroscopy with goal of right sided stone free.  Given solitary kidney he wished to proceed.  Informed consent was obtained and placed in medical record.  PROCEDURE DETAILS:  The patient being Dalton Miller and procedure being right ureteroscopic stone manipulation and transurethral  resection of bladder tumor was confirmed.  Procedure timeout was performed.  Intravenous antibiotics were administered.  General  LMA anesthesia induced.  The patient was placed into a low lithotomy position.  Sterile field was created, prepped and draped the patient's penis, perineum, and proximal thighs using iodine.  Cystourethroscopy was performed using 21-French rigid  cystoscope with offset lens per standard posterior urethra, only revealed some subtle papillary changes in the area of the right bladder neck prostate lobe.  Bladder was unremarkable.  There was mild trabeculation.  The left ureteral orifice was  surgically absent.  The right ureteral orifice was cannulated with a 6-French end-hole catheter, and a right retrograde pyelogram was obtained.  Right retrograde pyelogram demonstrated single right ureter, single system right kidney.  No filling defects or narrowing noted.  A 0.038 ZIPwire was advanced to the level of the upper poles set aside as a safety wire.  An 8-French feeding tube placed in  the urinary bladder for pressure release and semirigid ureteroscopy performed of the distal 2/3 of the right ureter alongside a separate sensor working wire.  No mucosal abnormalities were found.  Next, semirigid scope was exchanged for a medium length  ureteral access sheath to the level of proximal ureter using continuous fluoroscopic guidance and flexible digital ureteroscopy was performed of the proximal right ureter and systematic inspection of the right kidney, including all calices x3.  There was  multifocal relatively small volume nonobstructing renal stones.  Dominant stone was in upper mid calix and appeared to be approximately 3 mm.  There were two other foci that were quite small, less than 1 mm.  These were all amenable to simple basketing.   They were  sequentially grasped with the long axis removed and set aside for compositional analysis, following this complete resolution of all  accessible stone fragments larger than one-third mm access sheath was removed under continuous vision, no significant  mucosal abnormalities found and a new 5 x 26 Polaris type stent was carefully placed using fluoroscopic guidance.  Good proximal and distal plane were noted.  Tether was not left in place and given his solitary kidney and a desire to allow maximal  resolution of any ureteral edema before removal. Next the 26-French resectoscope sheath with visual obturator was carefully introduced and using resectoscope loop, very careful resection was performed of the papillary tissue on the right prostatic lobe,  completely removed all accessible papillary tissue.  This set aside labeled as prostatic urethral tumor, given the papillary change were quite subtle I was quite happy with the sampling of the deep aspect, which essentially was prostate stroma and a  separate specimen was set aside.  The area of resection was carefully fulgurated.  Hemostasis was excellent.  We achieved the goals of the surgery today.  Bladder was partially empty per cystoscope.  Procedure was then terminated.  The patient  tolerated the procedure well, no immediate perioperative complications.  The patient was taken to postanesthesia care in stable condition.  Plan for discharge home after void.  He will have the stent removed in the office in followup visit at the time of  pathology review.   SHY D: 10/21/2022 11:46:20 am T: 10/21/2022 1:15:00 pm  JOB: [id number]/ [id number]

## 2022-10-21 NOTE — Brief Op Note (Signed)
10/21/2022  11:40 AM  PATIENT:  Dalton Miller  77 y.o. male  PRE-OPERATIVE DIAGNOSIS:  BLADDER CANCER, RIGHT URETERAL AND RENAL CALCULUS  POST-OPERATIVE DIAGNOSIS:  BLADDER CANCER, RIGHT URETERAL AND RENAL CALCULUS  PROCEDURE:  Procedure(s): TRANSURETHRAL RESECTION OF BLADDER TUMOR (TURBT) (N/A) CYSTOSCOPY/URETEROSCOPYBASKET STONE EXTRACTION/STENT PLACEMENT (Right)  SURGEON:  Surgeons and Role:    * Will Schier, Delbert Phenix., MD - Primary  PHYSICIAN ASSISTANT:   ASSISTANTS: none   ANESTHESIA:   general  EBL:  minimal   BLOOD ADMINISTERED:none  DRAINS: none   LOCAL MEDICATIONS USED:  NONE  SPECIMEN:  Source of Specimen:  1- Rt renal stone fragments - [facility]; 2 - bladder tumor / prostatic uretrha  DISPOSITION OF SPECIMEN:  PATHOLOGY  COUNTS:  YES  TOURNIQUET:  * No tourniquets in log *  DICTATION: .Other Dictation: Dictation Number [id number]  PLAN OF CARE: Discharge to home after PACU  PATIENT DISPOSITION:  PACU - hemodynamically stable.   Delay start of Pharmacological VTE agent (>24hrs) due to surgical blood loss or risk of bleeding: not applicable

## 2022-10-21 NOTE — Discharge Instructions (Signed)
1 - You may have urinary urgency (bladder spasms) and bloody urine on / off with stent in place. This is normal. ° °2 - Call MD or go to ER for fever >102, severe pain / nausea / vomiting not relieved by medications, or acute change in medical status ° °

## 2022-10-22 ENCOUNTER — Encounter (HOSPITAL_COMMUNITY): Payer: Self-pay | Admitting: Urology

## 2022-10-24 LAB — SURGICAL PATHOLOGY

## 2023-01-29 NOTE — Progress Notes (Unsigned)
NAOKI MIGLIACCIO, male    DOB: [birth year]    MRN: [id number]   Brief patient profile:  53  yowm active smoking  ref[facility] in [place]  [facility] a background of pnds since birth.  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   Cva around 2023 with tendency to choke on food    2 dogs in house/ not in bedroom     History of Present Illness  01/31/2023  Pulmonary/ 1st office eval/ [facility] /  Office  Chief Complaint  Patient presents with   Follow-up  Dyspnea:  food lion is the limit more due to back / balance issues  (rollator) not limited by doe  Cough: variably productive / sometimes slt discolored/ not related to voice use or eating   Sleep: ok while asleep then w/in an hour of awakening > cough recurs  SABA use: none  02: none  [facility]   No obvious day to day or daytime pattern/variability or assoc   mucus plugs or hemoptysis or cp or chest tightness, subjective wheeze or overt sinus or hb symptoms.    Also denies any obvious fluctuation of symptoms with weather or environmental changes or other aggravating or alleviating factors except as outlined above   No unusual exposure hx or h/o childhood pna/ asthma or knowledge of premature birth.  Current Allergies, Complete Past Medical History, Past Surgical History, Family History, and Social History were reviewed in [employer] record.  ROS  The following are not active complaints unless bolded Hoarseness, sore throat, dysphagia, dental problems, itching, sneezing,  nasal congestion or discharge of excess mucus or purulent secretions, ear ache,   fever, chills, sweats, unintended wt loss or wt gain, classically pleuritic or exertional cp,  orthopnea pnd or arm/hand swelling  or leg swelling, presyncope, palpitations, abdominal pain, anorexia, nausea, vomiting, diarrhea  or change in  bowel habits or change in bladder habits, change in stools or change in urine, dysuria, hematuria,  rash, arthralgias, visual complaints, headache, numbness, weakness or ataxia or problems with walking or coordination (rollator dep)  change in mood or  memory.            Outpatient Medications Prior to Visit  Medication Sig Dispense Refill   amLODipine (NORVASC) 2.5 MG tablet Take 1 tablet (2.5 mg total) by mouth daily. 30 tablet 0   aspirin EC 81 MG tablet Take 1 tablet (81 mg total) by mouth daily. Swallow whole. 30 tablet 0   atorvastatin (LIPITOR) 10 MG tablet Take 1 tablet (10 mg total) by mouth daily. (Patient taking differently: Take 10 mg by mouth at bedtime.) 30 tablet 0   ferrous sulfate 325 (65 FE) MG EC tablet Take 325 mg by mouth every Monday, Wednesday, and Friday.     gabapentin (NEURONTIN) 300 MG capsule Take 1 capsule (300 mg total) by mouth 3 (three) times daily. 90 capsule 0   ketorolac (TORADOL) 10 MG tablet Take 1 tablet (10 mg total) by mouth every 8 (eight) hours as needed for moderate pain (pain score 4-6) (or stent discomfort post-operatively). 20 tablet 0   metFORMIN (GLUCOPHAGE) 500 MG tablet Take 2 tablets (1,000 mg total) by mouth daily with breakfast AND 0.5 tablets (250 mg total) daily with lunch. (Patient taking differently: Take 500 mg by mouth twice daily) 90 tablet 0   oxyCODONE (  ROXICODONE) 5 MG immediate release tablet Take 1 tablet (5 mg total) by mouth every 6 (six) hours as needed for breakthrough pain (post-operatively). 15 tablet 0   pantoprazole (PROTONIX) 40 MG tablet Take 1 tablet (40 mg total) by mouth 2 (two) times daily. (Patient taking differently: Take 40 mg by mouth daily.) 60 tablet 0   Propylene Glycol 0.6 % SOLN Place 1 drop into both eyes as needed (dry eyes).     senna-docusate (SENOKOT-S) 8.6-50 MG tablet Take 1 tablet by mouth 2 (two) times daily. While taking strongest pain meds to prevent constipation. 10 tablet 0   No  facility-administered medications prior to visit.    Past Medical History:  Diagnosis Date   Acquired renal cyst of right kidney    Arthritis    Cancer (HCC)    renal cell    Cirrhosis (HCC)    Erectile dysfunction    Heart murmur    CHILDHOOD   Hematuria    left ureteral bleeding   History of cellulitis    2012 left wrist   History of kidney stones    Hyperlipidemia    Hypertension    Metastatic melanoma to lung, left (HCC)    Nephrolithiasis    bilateral non-obstructive per ct 07-02-2015   Stroke (HCC)    Type 2 diabetes mellitus (HCC)    Wears glasses       Objective:     BP 128/74   Pulse 70   Ht 5\' 7"  (1.702 m)   Wt 167 lb (75.8 kg)   SpO2 98% Comment: room air  BMI 26.16 kg/m   SpO2: 98 % (room air) RA   Stoic amb wm / congested rattly cough    HEENT : Oropharynx  clear/ top denture     Nasal turbinates nl    NECK :  without  apparent JVD/ palpable Nodes/TM    LUNGS: no acc muscle use,  Nl contour chest which is clear to A and P bilaterally without cough on insp or exp maneuvers   CV:  RRR  no s3 or murmur or increase in P2, and no edema   ABD:  soft and nontender   MS:  walks with rollator  ext warm without deformities Or obvious joint restrictions  calf tenderness, cyanosis or clubbing    SKIN: warm and dry without lesions    NEURO:  alert, approp, nl sensorium with  no motor or cerebellar deficits apparent.       Assessment   Chronic bronchitis (HCC) Active smoker s limiting doe (limited by arthritis/ neuropathy)  - 01/31/2023 rec max gerd rx plus diet/ mucinex dm and non-mint or menthol lozenges   As with most chronic cough, likely he has more than one cause at this point  Of the three most common causes of  Sub-acute / recurrent or chronic cough, only one (GERD)  can actually contribute to/ trigger  the other two (asthma and post nasal drip syndrome)  and perpetuate the cylce of cough.  While not intuitively obvious, many patients  with chronic low grade reflux do not cough until there is a primary insult that disturbs the protective epithelial barrier and exposes sensitive nerve endings.   This is typically viral but can due to PNDS and  either may apply here.    >>>  The point is that once this occurs, it is difficult to eliminate the cycle  using anything but a maximally effective acid suppression regimen at least in the  short run, accompanied by an appropriate diet to address non acid GERD and control / eliminate the cough itself with gabapentin 300 mg tid combined with  max mucinex dm and hard rock candies to help keep throat clearing to a minimum x 3 months then regoup with pfts on return with all meds in hand using a trust but verify approach to confirm accurate Medication  Reconciliation The principal here is that until we are certain that the  patients are doing what we've asked, it makes no sense to ask them to do more.   Cigarette smoker Counseled re importance of smoking cessation but did not meet time criteria for separate billing    Each maintenance medication was reviewed in detail including emphasizing most importantly the difference between maintenance and prns and under what circumstances the prns are to be triggered using an action plan format where appropriate.  Total time for H and P, chart review, counseling, reviewing and generating customized AVS unique to this office visit / same day charting  > 45 min with pt new to me          Sandrea Hughs, MD 01/31/2023

## 2023-01-31 ENCOUNTER — Encounter: Payer: Self-pay | Admitting: Internal Medicine

## 2023-01-31 ENCOUNTER — Ambulatory Visit (INDEPENDENT_AMBULATORY_CARE_PROVIDER_SITE_OTHER): Payer: No Typology Code available for payment source | Admitting: Internal Medicine

## 2023-01-31 VITALS — BP 128/74 | HR 70 | Ht 67.0 in | Wt 167.0 lb

## 2023-01-31 DIAGNOSIS — F17210 Nicotine dependence, cigarettes, uncomplicated: Secondary | ICD-10-CM | POA: Diagnosis not present

## 2023-01-31 DIAGNOSIS — J42 Unspecified chronic bronchitis: Secondary | ICD-10-CM | POA: Diagnosis not present

## 2023-01-31 DIAGNOSIS — F17200 Nicotine dependence, unspecified, uncomplicated: Secondary | ICD-10-CM

## 2023-01-31 NOTE — Assessment & Plan Note (Addendum)
Active smoker s limiting doe (limited by arthritis/ neuropathy)  - 01/31/2023 rec max gerd rx plus diet/ mucinex dm and non-mint or menthol lozenges   As with most chronic cough, likely he has more than one cause at this point  Of the three most common causes of  Sub-acute / recurrent or chronic cough, only one (GERD)  can actually contribute to/ trigger  the other two (asthma and post nasal drip syndrome)  and perpetuate the cylce of cough.  While not intuitively obvious, many patients with chronic low grade reflux do not cough until there is a primary insult that disturbs the protective epithelial barrier and exposes sensitive nerve endings.   This is typically viral but can due to PNDS and  either may apply here.    >>>  The point is that once this occurs, it is difficult to eliminate the cycle  using anything but a maximally effective acid suppression regimen at least in the short run, accompanied by an appropriate diet to address non acid GERD and control / eliminate the cough itself with gabapentin 300 mg tid combined with  max mucinex dm and hard rock candies to help keep throat clearing to a minimum x 3 months then regoup with pfts on return with all meds in hand using a trust but verify approach to confirm accurate Medication  Reconciliation The principal here is that until we are certain that the  patients are doing what we've asked, it makes no sense to ask them to do more.

## 2023-01-31 NOTE — Patient Instructions (Signed)
Mucinex dm  1200 mg every 12hours with glass of water as needed  for cough   Increase gabapentin to 300 mg bfast supper and bedtime   Pantoprazole 40 mg Take 30- 60 min before your first and last meals of the day   GERD (REFLUX)  is an extremely common cause of respiratory symptoms just like yours , many times with no obvious heartburn at all.    It can be treated with medication, but also with lifestyle changes including elevation of the head of your bed (ideally with 6 -8inch blocks under the headboard of your bed),  Smoking cessation, avoidance of late meals, excessive alcohol, and avoid fatty foods, chocolate, peppermint, colas, red wine, and acidic juices such as orange juice.  NO MINT OR MENTHOL PRODUCTS SO NO COUGH DROPS  USE SUGARLESS CANDY INSTEAD (Jolley ranchers or Stover's or Life Savers) or even ice chips will also do - the key is to swallow to prevent all throat clearing. NO OIL BASED VITAMINS - use powdered substitutes.  Avoid fish oil when coughing.   The key is to stop smoking completely before smoking completely stops you!   Please schedule a follow up visit in 3 months with PFTS  but call sooner if needed  with all medications /inhalers/ solutions in hand so we can verify exactly what you are taking. This includes all medications from all doctors and over the counters

## 2023-01-31 NOTE — Assessment & Plan Note (Addendum)
Counseled re importance of smoking cessation but did not meet time criteria for separate billing    Each maintenance medication was reviewed in detail including emphasizing most importantly the difference between maintenance and prns and under what circumstances the prns are to be triggered using an action plan format where appropriate.  Total time for H and P, chart review, counseling, reviewing and generating customized AVS unique to this office visit / same day charting  > 45 min with pt new to me

## 2023-02-02 ENCOUNTER — Telehealth: Payer: Self-pay | Admitting: Internal Medicine

## 2023-02-02 NOTE — Telephone Encounter (Signed)
Marcelino Duster from the [place] reached out and states in the record of the veteran's visit on 01/31/2023 there is no mention of pulmonary nodule which is what the patient was referred for. Marcelino Duster is asking to clarify if the dr was able to review CT scan and is aware of the diagnosis we sent veteran out for? CT scan 12/22/22: Emphysematous changes with slight increased size of 7 x 6 x 6 mm right upper lobe pulmonary nodule with slightly irregular borders. On prior most recent September 2024 exam where it measured 6 mm x 4 mm x 5 mm.  Please advise.

## 2023-02-02 NOTE — Telephone Encounter (Signed)
Please advise

## 2023-02-02 NOTE — Telephone Encounter (Signed)
Will need a super D CT chest 3 months from the last one at [place] and it should be done here so we cane use the scan  for  possible bx

## 2023-02-03 ENCOUNTER — Other Ambulatory Visit: Payer: Self-pay

## 2023-02-03 DIAGNOSIS — J438 Other emphysema: Secondary | ICD-10-CM

## 2023-02-03 DIAGNOSIS — J42 Unspecified chronic bronchitis: Secondary | ICD-10-CM

## 2023-02-03 NOTE — Telephone Encounter (Signed)
Placed order for patient to have ct scan  3 month from the last scan. Tried to call patient to advise him of this vm was full couldn't lvm.

## 2023-02-10 ENCOUNTER — Ambulatory Visit (HOSPITAL_COMMUNITY)
Admission: RE | Admit: 2023-02-10 | Discharge: 2023-02-10 | Disposition: A | Discharge status: Home / Self Care | Payer: Medicare PPO | Source: Ambulatory Visit | Attending: Internal Medicine | Admitting: Internal Medicine

## 2023-02-10 DIAGNOSIS — J438 Other emphysema: Secondary | ICD-10-CM | POA: Insufficient documentation

## 2023-02-10 DIAGNOSIS — J42 Unspecified chronic bronchitis: Secondary | ICD-10-CM | POA: Insufficient documentation

## 2023-03-14 ENCOUNTER — Institutional Professional Consult (permissible substitution): Payer: Medicare PPO | Admitting: Internal Medicine

## 2023-03-20 ENCOUNTER — Telehealth: Payer: Self-pay | Admitting: Emergency Medicine

## 2023-03-20 NOTE — Telephone Encounter (Signed)
 PLease schedule this pt into a nodule slot of blocked slot w RB. Thanks!

## 2023-03-20 NOTE — Telephone Encounter (Signed)
 ATC pt, vm was unaavailable. Spoke w/ pts daughter Alvis Lemmings ([place]) Pts daughter verbalized understanding for scheduling appt. Appt has been scheduled 4/3. If appt time & date doesn't work pt or daughter will cb. Nothing further needed.

## 2023-04-04 ENCOUNTER — Encounter (HOSPITAL_COMMUNITY): Payer: No Typology Code available for payment source

## 2023-04-06 ENCOUNTER — Ambulatory Visit: Admitting: Emergency Medicine

## 2023-04-06 ENCOUNTER — Encounter: Payer: Self-pay | Admitting: Emergency Medicine

## 2023-04-19 ENCOUNTER — Other Ambulatory Visit (HOSPITAL_COMMUNITY): Payer: Self-pay | Admitting: Internal Medicine

## 2023-04-19 DIAGNOSIS — R911 Solitary pulmonary nodule: Secondary | ICD-10-CM

## 2023-04-26 NOTE — Progress Notes (Deleted)
 DONTREAL Miller, male    DOB: [birth year]    MRN: [id number]   Brief patient profile:  41  yowm active smoking  ref[facility] clinic in [place]  [facility] on a background of pnds since birth.  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   Cva around 2023 with tendency to choke on food    2 dogs in house/ not in bedroom     History of Present Illness  01/31/2023  Pulmonary/ 1st office eval/ [name] / [place] [facility]  Chief Complaint  Patient presents with   Follow-up  Dyspnea:  food lion is the limit more due to back / balance issues  (rollator) not limited by doe  Cough: variably productive / sometimes slt discolored/ not related to voice use or eating   Sleep: ok while asleep then w/in an hour of awakening > cough recurs  SABA use: none  02: none  [facility]  Rec Mucinex  dm  1200 mg every 12hours with glass of water  as needed  for cough  Increase gabapentin  to 300 mg bfast supper and bedtime  Pantoprazole  40 mg Take 30- 60 min before your first and last meals of the day  GERD diet reviewed, bed blocks rec  The key is to stop smoking completely before smoking completely stops you!  Please schedule a follow up visit in 3 months with PFTS  but call sooner if needed  with all medications /inhalers/ solutions in hand     04/27/2023  f/u ov/[place] office/[name] re: cough x his late 8s  maint on *** did not do pfts yet/ did *** bring meds  No chief complaint on file.   Dyspnea:  *** Cough: *** Sleeping: ***   resp cc  SABA use: *** 02: ***  Lung cancer screening: ***   No obvious day to day or daytime variability or assoc excess/ purulent sputum or mucus plugs or hemoptysis or cp or chest tightness, subjective wheeze or overt sinus or hb symptoms.    Also denies any obvious fluctuation of symptoms with weather or environmental changes or other aggravating or alleviating  factors except as outlined above   No unusual exposure hx or h/o childhood pna/ asthma or knowledge of premature birth.  Current Allergies, Complete Past Medical History, Past Surgical History, Family History, and Social History were reviewed in [employer] record.  ROS  The following are not active complaints unless bolded Hoarseness, sore throat, dysphagia, dental problems, itching, sneezing,  nasal congestion or discharge of excess mucus or purulent secretions, ear ache,   fever, chills, sweats, unintended wt loss or wt gain, classically pleuritic or exertional cp,  orthopnea pnd or arm/hand swelling  or leg swelling, presyncope, palpitations, abdominal pain, anorexia, nausea, vomiting, diarrhea  or change in bowel habits or change in bladder habits, change in stools or change in urine, dysuria, hematuria,  rash, arthralgias, visual complaints, headache, numbness, weakness or ataxia or problems with walking or coordination,  change in mood or  memory.        No outpatient medications have been marked as taking for the 04/27/23 encounter (Appointment) with [name], MD.           Past Medical History:  Diagnosis Date   Acquired renal cyst of right kidney    Arthritis    Cancer [facility])    renal  cell    Cirrhosis (HCC)    Erectile dysfunction    Heart murmur    CHILDHOOD   Hematuria    left ureteral bleeding   History of cellulitis    2012 left wrist   History of kidney stones    Hyperlipidemia    Hypertension    Metastatic melanoma to lung, left (HCC)    Nephrolithiasis    bilateral non-obstructive per ct 07-02-2015   Stroke [facility])    Type 2 diabetes mellitus (HCC)    Wears glasses       Objective:    Wts   04/27/2023        ***  01/31/23 167 lb (75.8 kg)  10/21/22 162 lb (73.5 kg)  06/03/21 157 lb 13.6 oz (71.6 kg)      Vital signs reviewed  04/27/2023  - Note at rest 02 sats  ***% on ***   General appearance:    ***              Assessment

## 2023-04-27 ENCOUNTER — Encounter (HOSPITAL_COMMUNITY): Admission: RE | Admit: 2023-04-27 | Source: Ambulatory Visit

## 2023-04-27 ENCOUNTER — Ambulatory Visit: Payer: No Typology Code available for payment source | Admitting: Internal Medicine

## 2023-04-27 ENCOUNTER — Telehealth: Payer: Self-pay | Admitting: Internal Medicine

## 2023-04-27 ENCOUNTER — Ambulatory Visit (HOSPITAL_COMMUNITY): Admission: RE | Admit: 2023-04-27 | Payer: No Typology Code available for payment source | Source: Ambulatory Visit

## 2023-04-27 NOTE — Telephone Encounter (Signed)
 Spoke with patient regarding the  Thursday 05/04/23 10:00 am PET scan at [facility] time is 9:30 am--1st floor registration desk--PFT rescheduled to 05/25/23 at 2:00 pm at [facility] time is 1:45 pm--1st floor registration desk for check in---follow up with Dr. Waymond Hailey  05/25/23 at 3:00 pm----will mail information to patient and he voiced his understanding

## 2023-05-01 ENCOUNTER — Telehealth: Payer: Self-pay | Admitting: *Deleted

## 2023-05-01 NOTE — Telephone Encounter (Signed)
 Copied from CRM [id number]. Topic: Appointments - Scheduling Inquiry for Clinic >> May 01, 2023 11:08 AM Roseanne Cones wrote: Reason for CRM: Patient's daughter, Idamae Maize was calling to confirm next pulmonary appointments as they thought they may have missed an appointment - I advised them on the upcomming appointments on 05/22 - Patient Access Specialist and rep noted that the appointments were very close together for PFT and follow up with Dr. Waymond Hailey - Is there a way to move Dr. Waymond Hailey appointment to 3:30 pm to give them time to make it from [facility]? (They are currently scheduled for a PFT scheduled at 2 pm).  05/01/23----Spoke with Dawn and explained to her Mr. Toole's breathing test should only take an hour---they should have plenty of time to come across the street for Dr. Jacqui Mau appointment-as long as they are here by 3:14 pm--I told her if they decide to reschedule to call back and we will be glad to take care of that for them.  She voiced her understanding.

## 2023-05-04 ENCOUNTER — Encounter (HOSPITAL_COMMUNITY): Admission: RE | Admit: 2023-05-04 | Source: Ambulatory Visit

## 2023-05-21 NOTE — Progress Notes (Deleted)
 Dalton Miller, male    DOB: 01/02/1946    MRN: [id number]   Brief patient profile:  79  yowm active smoking  referred to pulmonary clinic in [place]  [facility] on a background of pnds since birth.  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   Cva around 2023 with tendency to choke on food    2 dogs in house/ not in bedroom   History of Present Illness  01/31/2023  Pulmonary/ 1st office eval/ Deola Rewis / [place] [facility]  Chief Complaint  Patient presents with   Follow-up  Dyspnea:  food lion is the limit more due to back / balance issues  (rollator) not limited by doe  Cough: variably productive / sometimes slt discolored/ not related to voice use or eating   Sleep: ok while asleep then w/in an hour of awakening > cough recurs  SABA use: none  02: none  LDSCT:  [facility]  due in fall per wife  Rec Mucinex  dm  1200 mg every 12hours with glass of water  as needed  for cough  Increase gabapentin  to 300 mg bfast supper and bedtime  Pantoprazole  40 mg Take 30- 60 min before your first and last meals of the day  GERD diet reviewed, bed blocks rec  The key is to stop smoking completely before smoking completely stops you!  Please schedule a follow up visit in 3 months with PFTS- not done as of 05/25/2023   return  with all medications /inhalers/ solutions in hand     05/25/2023  f/u ov/[place] office/Evaan Tidwell re: cough x his late 48s  maint on *** did not do pfts yet/ did *** bring meds  No chief complaint on file.   Dyspnea:  *** Cough: *** Sleeping: ***   resp cc  SABA use: *** 02: ***  Lung cancer screening: ***   No obvious day to day or daytime variability or assoc excess/ purulent sputum or mucus plugs or hemoptysis or cp or chest tightness, subjective wheeze or overt sinus or hb symptoms.    Also denies any obvious fluctuation of symptoms with weather or environmental changes or other aggravating or  alleviating factors except as outlined above   No unusual exposure hx or h/o childhood pna/ asthma or knowledge of premature birth.  Current Allergies, Complete Past Medical History, Past Surgical History, Family History, and Social History were reviewed in [employer] record.  ROS  The following are not active complaints unless bolded Hoarseness, sore throat, dysphagia, dental problems, itching, sneezing,  nasal congestion or discharge of excess mucus or purulent secretions, ear ache,   fever, chills, sweats, unintended wt loss or wt gain, classically pleuritic or exertional cp,  orthopnea pnd or arm/hand swelling  or leg swelling, presyncope, palpitations, abdominal pain, anorexia, nausea, vomiting, diarrhea  or change in bowel habits or change in bladder habits, change in stools or change in urine, dysuria, hematuria,  rash, arthralgias, visual complaints, headache, numbness, weakness or ataxia or problems with walking or coordination,  change in mood or  memory.        No outpatient medications have been marked as taking for the 05/25/23 encounter (Appointment) with [name], MD.           Past Medical History:  Diagnosis Date   Acquired renal cyst of right kidney    Arthritis    Cancer [facility])    renal  cell    Cirrhosis (HCC)    Erectile dysfunction    Heart murmur    CHILDHOOD   Hematuria    left ureteral bleeding   History of cellulitis    2012 left wrist   History of kidney stones    Hyperlipidemia    Hypertension    Metastatic melanoma to lung, left (HCC)    Nephrolithiasis    bilateral non-obstructive per ct 07-02-2015   Stroke [facility])    Type 2 diabetes mellitus (HCC)    Wears glasses       Objective:    Wts   05/25/2023        ***  01/31/23 167 lb (75.8 kg)  10/21/22 162 lb (73.5 kg)  06/03/21 157 lb 13.6 oz (71.6 kg)    Vital signs reviewed  05/25/2023  - Note at rest 02 sats  ***% on ***   General appearance:    ***              Assessment

## 2023-05-25 ENCOUNTER — Encounter (HOSPITAL_COMMUNITY)
Admission: RE | Admit: 2023-05-25 | Discharge: 2023-05-25 | Disposition: A | Discharge status: Home / Self Care | Source: Ambulatory Visit | Attending: Internal Medicine | Admitting: Internal Medicine

## 2023-05-25 ENCOUNTER — Encounter: Payer: Self-pay | Admitting: Internal Medicine

## 2023-05-25 ENCOUNTER — Ambulatory Visit (HOSPITAL_COMMUNITY)
Admission: RE | Admit: 2023-05-25 | Discharge: 2023-05-25 | Disposition: A | Discharge status: Home / Self Care | Source: Ambulatory Visit | Attending: Internal Medicine | Admitting: Internal Medicine

## 2023-05-25 ENCOUNTER — Ambulatory Visit: Admitting: Internal Medicine

## 2023-05-25 DIAGNOSIS — R911 Solitary pulmonary nodule: Secondary | ICD-10-CM | POA: Insufficient documentation

## 2023-05-25 MED ORDER — FLUDEOXYGLUCOSE F - 18 (FDG) INJECTION
8.6100 | Freq: Once | INTRAVENOUS | Status: AC | PRN
  Administered 2023-05-25: 8.61 via INTRAVENOUS

## 2023-08-20 NOTE — Progress Notes (Unsigned)
 Dalton Miller, male    DOB: [birth year]    MRN: [id number]   Brief patient profile:  75  yowm active smoking  referred to pulmonary clinic in [place]  [facility] on a background of pnds since birth.  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   Cva around 2023 with tendency to choke on food    2 dogs in house/ not in bedroom     History of Present Illness  01/31/2023  Pulmonary/ 1st office eval/ Kerrilyn Azbill / [place] [facility]  Chief Complaint  Patient presents with   Follow-up  Dyspnea:  food lion is the limit more due to back / balance issues  (rollator) not limited by doe  Cough: variably productive / sometimes slt discolored/ not related to voice use or eating   Sleep: ok while asleep then w/in an hour of awakening > cough recurs  SABA use: none  02: none  LDSCT:  [facility]  due in fall per wife  Rec    08/23/2023  f/u ov/[place] office/Pho[place]ia Pirie re: *** maint on ***  No chief complaint on file.   Dyspnea:  *** Cough: *** Sleeping: ***   resp cc  SABA use: *** 02: ***  Lung cancer screening: ***   No obvious day to day or daytime variability or assoc excess/ purulent sputum or mucus plugs or hemoptysis or cp or chest tightness, subjective wheeze or overt sinus or hb symptoms.    Also denies any obvious fluctuation of symptoms with weather or environmental changes or other aggravating or alleviating factors except as outlined above   No unusual exposure hx or h/o childhood pna/ asthma or knowledge of premature birth.  Current Allergies, Complete Past Medical History, Past Surgical History, Family History, and Social History were reviewed in [employer] record.  ROS  The following are not active complaints unless bolded Hoarseness, sore throat, dysphagia, dental problems, itching, sneezing,  nasal congestion or discharge of excess mucus or purulent secretions, ear ache,   fever,  chills, sweats, unintended wt loss or wt gain, classically pleuritic or exertional cp,  orthopnea pnd or arm/hand swelling  or leg swelling, presyncope, palpitations, abdominal pain, anorexia, nausea, vomiting, diarrhea  or change in bowel habits or change in bladder habits, change in stools or change in urine, dysuria, hematuria,  rash, arthralgias, visual complaints, headache, numbness, weakness or ataxia or problems with walking or coordination,  change in mood or  memory.        No outpatient medications have been marked as taking for the 08/23/23 encounter (Appointment) with Darlean Ozell NOVAK, MD.           Past Medical History:  Diagnosis Date   Acquired renal cyst of right kidney    Arthritis    Cancer [facility])    renal cell    Cirrhosis (HCC)    Erectile dysfunction    Heart murmur    CHILDHOOD   Hematuria    left ureteral bleeding   History of cellulitis    2012 left wrist   History of kidney stones    Hyperlipidemia    Hypertension    Metastatic melanoma to lung, left (HCC)    Nephrolithiasis    bilateral non-obstructive per ct 07-02-2015   Stroke [facility])    Type 2 diabetes mellitus (HCC)    Wears glasses       Objective:    Wts  08/23/2023        ***   01/31/23 167 lb (75.8 kg)  10/21/22 162 lb (73.5 kg)  06/03/21 157 lb 13.6 oz (71.6 kg)      Vital signs reviewed  08/23/2023  - Note at rest 02 sats  ***% on ***   General appearance:    ***             Assessment

## 2023-08-23 ENCOUNTER — Ambulatory Visit (INDEPENDENT_AMBULATORY_CARE_PROVIDER_SITE_OTHER): Admitting: Internal Medicine

## 2023-08-23 ENCOUNTER — Encounter: Payer: Self-pay | Admitting: Internal Medicine

## 2023-08-23 VITALS — BP 138/73 | HR 75 | Ht 67.0 in | Wt 170.8 lb

## 2023-08-23 DIAGNOSIS — R911 Solitary pulmonary nodule: Secondary | ICD-10-CM | POA: Diagnosis not present

## 2023-08-23 DIAGNOSIS — F17210 Nicotine dependence, cigarettes, uncomplicated: Secondary | ICD-10-CM | POA: Diagnosis not present

## 2023-08-23 DIAGNOSIS — J42 Unspecified chronic bronchitis: Secondary | ICD-10-CM

## 2023-08-23 NOTE — Patient Instructions (Addendum)
 Mucinex  dm  1200 mg every 12hours with glass of water  as needed  for cough /congestion   I will be referring you to our pulmonary nodule doctor who comes to this office  PFTs in meantime next available   The key is to stop smoking completely before smoking completely stops you!

## 2023-08-24 DIAGNOSIS — R911 Solitary pulmonary nodule: Secondary | ICD-10-CM | POA: Insufficient documentation

## 2023-08-24 NOTE — Assessment & Plan Note (Addendum)
 4-5 min discussion re active cigarette smoking in addition to office E&M  Ask about tobacco use:   ongoing Advise quitting   direct link between symptoms (CB) and smoking reviewed with likely improvement w/in 6 weeks of stopping  Assess willingness:  Not committed at this point Assist in quit attempt:  Per PCP when ready Arrange follow up:   Follow up per Primary Care planned

## 2023-08-24 NOTE — Assessment & Plan Note (Addendum)
 Active smoker - Super D CT  05/25/23 Solid 8 mm right upper lobe nodule on image 47/4 appears similar to the most recent study, although has enlarged from 09/29/2022, and appears new from 08/26/2021.  - PET 05/29/23  Low-level radiotracer uptake along the slowly growing right upper lobe lung nodule measuring 9 mm. Low-grade malignancy is in the differential with the slow growth over time and the low-level of uptake seen    Pt was supposed to see Dr Shelah in f/u but in meantime is confused about what the [facility] plans and says has not heard back about the PET results above and would like to be followed for this problem here but I don't offer nav bx so have referred him to one of our new partners for that purpose.  Discussed in detail all the  indications, usual  risks and alternatives  relative to the benefits with patient who agrees to proceed with w/u as outlined.            Each maintenance medication was reviewed in detail including emphasizing most importantly the difference between maintenance and prns and under what circumstances the prns are to be triggered using an action plan format where appropriate.  Total time for H and P, chart review, counseling,   and generating customized AVS unique to this office visit / same day charting = 

## 2023-08-24 NOTE — Assessment & Plan Note (Addendum)
 Active smoker s limiting doe (limited by arthritis/ neuropathy)  - 01/31/2023 rec max gerd rx plus diet/ mucinex  dm and non-mint or menthol lozenges   Reviewed max rx for CB/ no need for saba bronchodilators at this point - will await pft before deciding on best fit for him with [facility] restrictions on resp meds in mind.

## 2023-08-30 ENCOUNTER — Telehealth: Payer: Self-pay | Admitting: Internal Medicine

## 2023-08-30 NOTE — Telephone Encounter (Signed)
 Copied from CRM [id number]. Topic: General - Other >> Aug 30, 2023  8:35 AM Joesph PARAS wrote: Reason for CRM: [facility] is calling to request that the note for this patient on 08/20 be faxed to [phone] (oncologist), as patient has an appointment today and they cannot pull it in the system due to tech issues.  Requested documents faxed to [phone]---receipt confirmation received at 9:36 am

## 2023-10-03 ENCOUNTER — Ambulatory Visit (HOSPITAL_COMMUNITY): Admission: RE | Admit: 2023-10-03 | Source: Ambulatory Visit

## 2023-10-03 NOTE — Progress Notes (Deleted)
 Established Patient Pulmonology Office Visit   Subjective:  Patient ID: Dalton Miller, male    DOB: [birth year]  MRN: [id number]  CC: No chief complaint on file.   HPI  Dalton Miller is a 78 y/o M with DM, HLD, GERD, and enlarging RUL solitary pulmonary nodule who is here for follow up appointment.  Symptoms Associated with Lung cancer:   {Central Tumor Sx:33645}  {Peripheral Tumor Sx:33646}  {Sx of Metastasis:33647}  {Conditions associated with lung cancer & imp to identify prior to bronch:33648}  {STOPBANG:33649}  {Hx of Anesthesia reactions:33650}  PMH:   Important Medications:   Allergies:   Social History:  {Smoking and Biom[employer] Fuel Exposure:33651}  {Occupational Exposures:33652}  {Military Specific Exposures:33653}  Family History: {Cancer-related QY:66345}  ASA grade:  {ASA GRADE:110003}  Karnofsky Performance Status: {Karnofsky Performance Status:33655}  ECOG Performance Status: {findings; ecog performance status:31780}   {PULM QUESTIONNAIRES (Optional):33196}  ROS  {History (Optional):23778}  Current Outpatient Medications:    amLODipine  (NORVASC ) 2.5 MG tablet, Take 1 tablet (2.5 mg total) by mouth daily., Disp: 30 tablet, Rfl: 0   aspirin  EC 81 MG tablet, Take 1 tablet (81 mg total) by mouth daily. Swallow whole., Disp: 30 tablet, Rfl: 0   atorvastatin  (LIPITOR) 10 MG tablet, Take 1 tablet (10 mg total) by mouth daily. (Patient taking differently: Take 10 mg by mouth at bedtime.), Disp: 30 tablet, Rfl: 0   ferrous sulfate 325 (65 FE) MG EC tablet, Take 325 mg by mouth every Monday, Wednesday, and Friday., Disp: , Rfl:    gabapentin  (NEURONTIN ) 300 MG capsule, Take 1 capsule (300 mg total) by mouth 3 (three) times daily., Disp: 90 capsule, Rfl: 0   ketorolac  (TORADOL ) 10 MG tablet, Take 1 tablet (10 mg total) by mouth every 8 (eight) hours as needed for moderate pain (pain score 4-6) (or stent discomfort post-operatively)., Disp: 20 tablet, Rfl:  0   metFORMIN  (GLUCOPHAGE ) 500 MG tablet, Take 2 tablets (1,000 mg total) by mouth daily with breakfast AND 0.5 tablets (250 mg total) daily with lunch. (Patient taking differently: Take 500 mg by mouth twice daily), Disp: 90 tablet, Rfl: 0   oxyCODONE  (ROXICODONE ) 5 MG immediate release tablet, Take 1 tablet (5 mg total) by mouth every 6 (six) hours as needed for breakthrough pain (post-operatively)., Disp: 15 tablet, Rfl: 0   pantoprazole  (PROTONIX ) 40 MG tablet, Take 1 tablet (40 mg total) by mouth 2 (two) times daily. (Patient taking differently: Take 40 mg by mouth daily.), Disp: 60 tablet, Rfl: 0   Propylene Glycol 0.6 % SOLN, Place 1 drop into both eyes as needed (dry eyes)., Disp: , Rfl:    senna-docusate (SENOKOT-S) 8.6-50 MG tablet, Take 1 tablet by mouth 2 (two) times daily. While taking strongest pain meds to prevent constipation., Disp: 10 tablet, Rfl: 0      Objective:  There were no vitals taken for this visit. {Pulm Vitals (Optional):32837}  Physical Exam   Diagnostic Review:  {Labs (Optional):32838}  PET/CT 05/2023: IMPRESSION: Low-level radiotracer uptake along the slowly growing right upper lobe lung nodule measuring 9 mm. Low-grade malignancy is in the differential with the slow growth over time and the low-level of uptake seen today. Recommend either continued close surveillance or a more aggressive approach with sampling could be considered.   No additional areas of abnormal uptake at this time suggest additional areas of malignancy.   Numerous ancillary findings. Absence left kidney. Extensive vascular calcifications. Left adrenal adenoma. Chronic lung changes.  CT Chest 02/2023:  IMPRESSION: 1. Imaging for bronchoscopy planning and guidance. 2. Solid 8 mm right upper lobe nodule appears similar to the most recent study, although has enlarged from 09/29/2022, and appears new from 08/26/2021. Findings are suspicious for primary bronchogenic carcinoma or  metastatic disease. Correlate clinically. If bronchoscopy not performed, consider PET-CT for further evaluation. 3. No other evidence of metastatic disease or other acute findings. 4. Aortic Atherosclerosis (ICD10-I70.0) and Emphysema (ICD10-J43.9)  I reviewed chest imaging independently and patient has upper lobe emphysematous changes as well as RUL solid solitary pulmonary nodule measuring 8 mm in size. This nodule has SUV of around 2 on PET/CT.    Assessment & Plan:   Assessment & Plan   No orders of the defined types were placed in this encounter.     No follow-ups on file.   Kerianne Gurr, MD

## 2023-10-04 ENCOUNTER — Encounter: Payer: Self-pay | Admitting: Pulmonary Disease

## 2023-10-04 ENCOUNTER — Telehealth: Payer: Self-pay | Admitting: Pulmonary Disease

## 2023-10-04 ENCOUNTER — Ambulatory Visit: Admitting: Pulmonary Disease

## 2023-10-04 NOTE — Telephone Encounter (Signed)
 Spoke with patient to reschedule the  10/04/23 3:00 pm missed appointment with Dr. Hedwig patient Thursday 10/05/23 at 2:00 pm and he declined--states he would not be able to make the appointment---patient is rescheduled to  Tuesday 10/31/23 at 3:00 pm

## 2023-10-05 NOTE — Telephone Encounter (Signed)
 Spoke with patient and offered sooner appointment at our [facility] location in [place], but patent prefers to keep the appointment in [place]

## 2023-10-31 ENCOUNTER — Ambulatory Visit: Admitting: Pulmonary Disease

## 2023-11-10 ENCOUNTER — Telehealth: Payer: Self-pay | Admitting: *Deleted

## 2023-11-10 NOTE — Telephone Encounter (Signed)
 Copied from CRM [id number]. Topic: Clinical - Request for Lab/Test Order >> Nov 10, 2023  9:42 AM Devaughn RAMAN wrote: Reason for CRM: Leeroy with [facility] On[facility] Dept  Dr.Margalski is requesting a bronchoscopy for the lung nodule the pt is being seen for. Pt has an appt scheduled for 11/12 with Dr.Hattar.  [facility] [place] [facility] [phone] Ext: [phone]

## 2023-11-15 ENCOUNTER — Encounter

## 2023-11-22 ENCOUNTER — Telehealth: Payer: Self-pay

## 2023-11-22 ENCOUNTER — Encounter

## 2023-11-22 DIAGNOSIS — R918 Other nonspecific abnormal finding of lung field: Secondary | ICD-10-CM

## 2023-11-22 NOTE — Telephone Encounter (Signed)
 Pt was a no show for his consult with Dr. Zaida 11/22/23 Needs to reschedule ASAP- can see Algahanim in [place] I called him to get him rescheduled and there was no answer, and his VM was full  Routing to admin pool to f/u and see about getting him rescheduled  Thanks

## 2023-11-22 NOTE — Telephone Encounter (Signed)
 Attempted to call patient. Was unable to leave a voicemail due to mailbox being full.

## 2023-11-23 NOTE — Telephone Encounter (Signed)
 ATC 2x Unable to leave VM sent LTR

## 2023-11-24 NOTE — Telephone Encounter (Signed)
 ACT 3x unable to leave vm sent LTR

## 2024-01-05 ENCOUNTER — Telehealth: Payer: Self-pay | Admitting: Pulmonary Disease

## 2024-01-05 NOTE — Telephone Encounter (Signed)
 Copied from CRM [id number]. Topic: Appointments - Scheduling Inquiry for Clinic >> Jan 05, 2024  1:35 PM Leila C wrote: Reason for CRM: Patient's child Stephane [phone] is trying to schedule a biopsy of the lung/bronchoscopy that the [facility] advised patient to have. Informed Dawn, the office has been trying to contact patient with not success. Dawn states if the office cannot reach patient, please contact Dawn at [phone]; updated demographics. Please advise and call back.

## 2024-01-08 NOTE — Telephone Encounter (Signed)
 There is no order for a Bronchoscopy on our end to have this patient scheduled.

## 2024-01-08 NOTE — Telephone Encounter (Signed)
 ATC the pt x1. Unable to leave vm due to full mailbox. Called and spoke with the pts daughter (DPR) and advised pt would need to be seen in order for biopsy to be scheduled.  Pt has been scheduled with Dr. Catherine 2/11 in [place] to review info. I advised pt daughter to trying keep this ov scheduled in order for pt to have biopsy scheduled. NFN

## 2024-01-15 ENCOUNTER — Telehealth: Payer: Self-pay

## 2024-01-15 NOTE — Telephone Encounter (Signed)
 I see he has an appt in February with me. We will discuss further at that time regarding biopsy.

## 2024-01-15 NOTE — Telephone Encounter (Signed)
 Left detailed message for Dalton Miller that patient no showed Nov appt but has upcoming appt in Feb 2026.NFN

## 2024-01-15 NOTE — Telephone Encounter (Signed)
 Copied from CRM [id number]. Topic: General - Other >> Jan 10, 2024  4:23 PM Dedra B wrote: Reason for CRM: Alfonso from [facility] called to see if patient had an upcoming appt and to inquire about a bronchoscopy. She would like the last office visit note faxed to [phone]. She can be reached at [phone].  [facility] is requesting pt have bronch at next office visit

## 2024-02-14 ENCOUNTER — Encounter: Admitting: Pulmonary Disease
